# Patient Record
Sex: Male | Born: 1940 | Race: Black or African American | Hispanic: No | Marital: Single | Smoking: Current some day smoker
Health system: Southern US, Community
[De-identification: ages and names within clinical notes are randomized; demographics above are authoritative.]

## PROBLEM LIST (undated history)

## (undated) DIAGNOSIS — D649 Anemia, unspecified: Secondary | ICD-10-CM

## (undated) DIAGNOSIS — E7800 Pure hypercholesterolemia, unspecified: Secondary | ICD-10-CM

## (undated) DIAGNOSIS — E875 Hyperkalemia: Secondary | ICD-10-CM

## (undated) DIAGNOSIS — Z8679 Personal history of other diseases of the circulatory system: Secondary | ICD-10-CM

## (undated) DIAGNOSIS — I739 Peripheral vascular disease, unspecified: Secondary | ICD-10-CM

## (undated) DIAGNOSIS — J189 Pneumonia, unspecified organism: Secondary | ICD-10-CM

## (undated) DIAGNOSIS — R600 Localized edema: Secondary | ICD-10-CM

## (undated) DIAGNOSIS — I499 Cardiac arrhythmia, unspecified: Secondary | ICD-10-CM

## (undated) DIAGNOSIS — R634 Abnormal weight loss: Secondary | ICD-10-CM

## (undated) DIAGNOSIS — N183 Chronic kidney disease, stage 3 unspecified: Secondary | ICD-10-CM

## (undated) DIAGNOSIS — R569 Unspecified convulsions: Secondary | ICD-10-CM

## (undated) DIAGNOSIS — R509 Fever, unspecified: Secondary | ICD-10-CM

## (undated) DIAGNOSIS — I729 Aneurysm of unspecified site: Secondary | ICD-10-CM

## (undated) DIAGNOSIS — J449 Chronic obstructive pulmonary disease, unspecified: Secondary | ICD-10-CM

## (undated) DIAGNOSIS — I129 Hypertensive chronic kidney disease with stage 1 through stage 4 chronic kidney disease, or unspecified chronic kidney disease: Secondary | ICD-10-CM

## (undated) DIAGNOSIS — L89629 Pressure ulcer of left heel, unspecified stage: Secondary | ICD-10-CM

## (undated) DIAGNOSIS — E878 Other disorders of electrolyte and fluid balance, not elsewhere classified: Secondary | ICD-10-CM

## (undated) DIAGNOSIS — Z720 Tobacco use: Secondary | ICD-10-CM

## (undated) DIAGNOSIS — I10 Essential (primary) hypertension: Secondary | ICD-10-CM

## (undated) DIAGNOSIS — F1010 Alcohol abuse, uncomplicated: Secondary | ICD-10-CM

## (undated) HISTORY — PX: EYE SURGERY: SHX253

## (undated) HISTORY — DX: Hyperkalemia: E87.5

## (undated) HISTORY — DX: Tobacco use: Z72.0

## (undated) HISTORY — DX: Chronic obstructive pulmonary disease, unspecified: J44.9

## (undated) HISTORY — DX: Hypertensive chronic kidney disease with stage 1 through stage 4 chronic kidney disease, or unspecified chronic kidney disease: I12.9

---

## 1996-03-23 HISTORY — PX: CEREBRAL ANEURYSM REPAIR: SHX164

## 1997-09-21 ENCOUNTER — Other Ambulatory Visit: Admission: RE | Admit: 1997-09-21 | Discharge: 1997-09-21 | Payer: Self-pay | Admitting: Nephrology

## 2002-02-14 ENCOUNTER — Ambulatory Visit (HOSPITAL_COMMUNITY): Admission: RE | Admit: 2002-02-14 | Discharge: 2002-02-14 | Payer: Self-pay | Admitting: Gastroenterology

## 2004-02-27 ENCOUNTER — Emergency Department (HOSPITAL_COMMUNITY): Admission: EM | Admit: 2004-02-27 | Discharge: 2004-02-27 | Payer: Self-pay | Admitting: Emergency Medicine

## 2007-10-11 ENCOUNTER — Encounter: Admission: RE | Admit: 2007-10-11 | Discharge: 2007-10-11 | Payer: Self-pay | Admitting: Nephrology

## 2008-09-20 ENCOUNTER — Encounter: Admission: RE | Admit: 2008-09-20 | Discharge: 2008-09-20 | Payer: Self-pay | Admitting: Nephrology

## 2009-10-25 ENCOUNTER — Encounter: Admission: RE | Admit: 2009-10-25 | Discharge: 2009-10-25 | Payer: Self-pay | Admitting: Nephrology

## 2010-08-23 NOTE — Op Note (Signed)
NAME:  [name], HOOVLER                            ACCOUNT NO.:  [id number]   MEDICAL RECORD NO.:  [id number]                   PATIENT TYPE:  AMB   LOCATION:  ENDO                                 FACILITY:  [facility]   PHYSICIAN:  Jeryl Columbia, M.D.                 DATE OF BIRTH:  [birth year]   DATE OF PROCEDURE:  02/14/2002  DATE OF DISCHARGE:                                 OPERATIVE REPORT   PROCEDURE:  Colonoscopy.   INDICATIONS:  A patient with history of colon polyps. Due for repeat  screening.  Informed consent was signed.  Risks, benefits, methods, and  options were thoroughly discussed in the past by my nurse during the prep.   PREMEDICATIONS:  Demerol 100 mg, Versed 10 mg.   DESCRIPTION OF PROCEDURE:  Rectal inspection pertinent for internal  hemorrhoids, small.  Digital exam was negative.  A pediatric video  adjustable colonoscopy was inserted and easily advanced from the colon to  the cecum.  This did require rolling him on his back and some abdominal  pressure.  No abnormality was seen on insertion.  The cecum  was identified  by the appendiceal orifice and the ileocecal valve.  The scope was inserted  a short ways into the terminal ileum which was normal.  Photodocumentation  was obtained.  The scope was slowly withdrawn.  Prep was adequate.  There  was some liquid stool that required washing and suctioning.  On slow  withdrawal from the colon, other than some tiny hyperplastic appearing  rectal distal sigmoid polyps not biopsied, no abnormalities were seen.  Once  back in the rectum, the scope was retroflexed, pertinent for some internal  hemorrhoids.  The scope was straightened and readvanced toward the right and  left side of the colon.  Air was suctioned and the scope removed.  The  patient tolerated the procedure well.  There were no obvious immediate  complications.   ENDOSCOPIC DIAGNOSES:  1. Internal and external hemorrhoids.  2. A few tiny hyperplastic  perirectal and distal sigmoid polyps, not     biopsied.  3. Otherwise within normal limits to the terminal ileum.   PLAN:  Will be happy to see back p.r.n.  Otherwise return to the care of Dr.  Mare Ferrari for the customary health care maintenance, yearly rectals and  guaiacs, and otherwise doing well medically.  Repeat screening in five  years.                                                 Jeryl Columbia, M.D.    MEM/MEDQ  D:  02/14/2002  T:  02/14/2002  Job:  [id number]   cc:   Melburn Hake, M.D.  [address] [place]  [place]  [place] [address]  Fax: [phone]

## 2011-04-06 ENCOUNTER — Emergency Department (INDEPENDENT_AMBULATORY_CARE_PROVIDER_SITE_OTHER)
Admission: EM | Admit: 2011-04-06 | Discharge: 2011-04-06 | Disposition: A | Discharge status: Home / Self Care | Payer: Medicare Other | Source: Home / Self Care | Attending: Emergency Medicine | Admitting: Emergency Medicine

## 2011-04-06 ENCOUNTER — Emergency Department (INDEPENDENT_AMBULATORY_CARE_PROVIDER_SITE_OTHER): Payer: Medicare Other

## 2011-04-06 DIAGNOSIS — J40 Bronchitis, not specified as acute or chronic: Secondary | ICD-10-CM

## 2011-04-06 HISTORY — DX: Pure hypercholesterolemia, unspecified: E78.00

## 2011-04-06 HISTORY — DX: Essential (primary) hypertension: I10

## 2011-04-06 HISTORY — DX: Other disorders of electrolyte and fluid balance, not elsewhere classified: E87.8

## 2011-04-06 HISTORY — DX: Aneurysm of unspecified site: I72.9

## 2011-04-06 MED ORDER — ALBUTEROL SULFATE (5 MG/ML) 0.5% IN NEBU
INHALATION_SOLUTION | RESPIRATORY_TRACT | Status: AC
  Filled 2011-04-06: qty 1

## 2011-04-06 MED ORDER — PREDNISONE 20 MG PO TABS
60.0000 mg | ORAL_TABLET | Freq: Once | ORAL | Status: AC
  Administered 2011-04-06: 60 mg via ORAL

## 2011-04-06 MED ORDER — DEXAMETHASONE 4 MG PO TABS: ORAL_TABLET | ORAL | Status: AC

## 2011-04-06 MED ORDER — PREDNISONE 20 MG PO TABS
ORAL_TABLET | ORAL | Status: AC
  Filled 2011-04-06: qty 3

## 2011-04-06 MED ORDER — IPRATROPIUM BROMIDE 0.02 % IN SOLN
0.5000 mg | Freq: Once | RESPIRATORY_TRACT | Status: AC
  Administered 2011-04-06: 0.5 mg via RESPIRATORY_TRACT

## 2011-04-06 MED ORDER — ALBUTEROL SULFATE (5 MG/ML) 0.5% IN NEBU
5.0000 mg | INHALATION_SOLUTION | Freq: Once | RESPIRATORY_TRACT | Status: AC
  Administered 2011-04-06: 5 mg via RESPIRATORY_TRACT

## 2011-04-06 MED ORDER — ALBUTEROL SULFATE HFA 108 (90 BASE) MCG/ACT IN AERS
1.0000 | INHALATION_SPRAY | Freq: Four times a day (QID) | RESPIRATORY_TRACT | Status: DC | PRN
Start: 2011-04-06 — End: 2012-04-25

## 2011-04-06 NOTE — ED Notes (Signed)
Pt has cough that started one week ago.

## 2011-04-06 NOTE — ED Provider Notes (Signed)
History     CSN: [id number]  Arrival date & time 04/06/11  1055   First MD Initiated Contact with Patient 04/06/11 1235      Chief Complaint  Patient presents with  . Cough    HPI Comments: Pt with nonproductive cough x 1 month. Mild SOB.  no wheezing, CP, N/V, posttussive emesis, hemoptysis, fatigue, myalgias, untentional weight loss, fevers. Pt is longterm heavy smoker and states this cough is from smoking, and that it has gotten better since he cut down to 1/2 ppd. Also taking alkaseltzer cough and cold with relief. Last dose several days ago. Pt wants to make sure he does not have PNA. No prev dex of emphysema/COPD/asthma.   Patient is a 70 y.o. male presenting with cough. The history is provided by the patient.  Cough This is a new problem. The current episode started more than 1 week ago. The problem occurs constantly. The problem has been gradually improving. The cough is non-productive. There has been no fever. Associated symptoms include shortness of breath. Pertinent negatives include no chest pain, no chills, no sweats, no weight loss, no ear pain, no headaches, no rhinorrhea, no myalgias and no wheezing. He is a smoker.    Past Medical History  Diagnosis Date  . Hypertension   . Hyperchloremia   . Hypercholesteremia   . Aneurysm     Past Surgical History  Procedure Date  . Cerebral aneurysm repair     History reviewed. No pertinent family history.  History  Substance Use Topics  . Smoking status: Current Everyday Smoker -- 0.5 packs/day for 56 years  . Smokeless tobacco: Not on file  . Alcohol Use: Yes      Review of Systems  Constitutional: Negative for fever, chills, weight loss, fatigue and unexpected weight change.  HENT: Negative for ear pain and rhinorrhea.   Respiratory: Positive for cough and shortness of breath. Negative for chest tightness, wheezing and stridor.   Cardiovascular: Negative for chest pain.  Gastrointestinal: Negative for nausea,  vomiting and abdominal pain.  Musculoskeletal: Negative for myalgias.  Skin: Negative for pallor and rash.  Neurological: Negative for weakness and headaches.    Allergies  Review of patient's allergies indicates no known allergies.  Home Medications   Current Outpatient Rx  Name Route Sig Dispense Refill  . DIVALPROEX SODIUM 125 MG PO TBEC Oral Take 125 mg by mouth 3 (three) times daily.      Marland Kitchen PRESCRIPTION MEDICATION  Pt takes b/p, chol and ? Med for aneurysm     . SIMVASTATIN 10 MG PO TABS Oral Take 10 mg by mouth at bedtime.      . ALBUTEROL SULFATE HFA 108 (90 BASE) MCG/ACT IN AERS Inhalation Inhale 1-2 puffs into the lungs every 6 (six) hours as needed for wheezing. 1 Inhaler 0  . DEXAMETHASONE 4 MG PO TABS  4 tabs po at once on day one, 4 tabs po at once on day 2 8 tablet 0    BP 175/83  Pulse 63  Temp(Src) 98.8 F (37.1 C) (Oral)  Resp 15  SpO2 93%  Physical Exam  Nursing note and vitals reviewed. Constitutional: He is oriented to person, place, and time. He appears well-developed and well-nourished. No distress.  HENT:  Head: Normocephalic and atraumatic.  Eyes: Conjunctivae and EOM are normal. Pupils are equal, round, and reactive to light.  Neck: Normal range of motion. Neck supple.  Cardiovascular: Regular rhythm.   Pulmonary/Chest: Effort normal. No respiratory distress. He  has no wheezes. He has no rales. He exhibits no tenderness.       Poor air movement  Abdominal: Soft. Bowel sounds are normal. He exhibits no distension.  Musculoskeletal: Normal range of motion.  Lymphadenopathy:    He has no cervical adenopathy.  Neurological: He is alert and oriented to person, place, and time.  Skin: Skin is warm and dry.  Psychiatric: He has a normal mood and affect. His behavior is normal.    ED Course  Procedures (including critical care time)  Labs Reviewed - No data to display Dg Chest 2 View  04/06/2011  *RADIOLOGY REPORT*  Clinical Data: Persistent cough.   CHEST - 2 VIEW  Comparison: 10/25/2009  Findings: There is chronic peribronchial thickening, best seen on the lateral view.  No acute infiltrates or effusions.  Heart size and vascularity are normal.  No osseous abnormality.  IMPRESSION: Chronic bronchitic changes, unchanged since 10/25/2009.  Original Report Authenticated By: Larey Seat, M.D.     1. Bronchitis       MDM  Suspect emphysema/copd with 50+ year h/o smoking. States sx getting better since he cut down to 1/2 ppd. satting 93% on RA bvut is speaking in full sentences. Suspect that this is baseline but have no records indicating this. No respiratory distress. Poor air movement. Will check CXR, give duoneb, prednisone & re-evaluate.   Previous chart, labs, imaging reviewed. CXR on 7.2011 with borderline cardiomegaly. Imaging reviewed by myself. Report per radiologist.  Dg Chest 2 View  04/06/2011  *RADIOLOGY REPORT*  Clinical Data: Persistent cough.  CHEST - 2 VIEW  Comparison: 10/25/2009  Findings: There is chronic peribronchial thickening, best seen on the lateral view.  No acute infiltrates or effusions.  Heart size and vascularity are normal.  No osseous abnormality.  IMPRESSION: Chronic bronchitic changes, unchanged since 10/25/2009.  Original Report Authenticated By: Larey Seat, M.D.    On re-evaluation, pt feels much improved after medication, pt comfortable, VSS. satting 99% on RA. Has some ronchi RLL but Improved air movement, no wheezing on repeat physical exam.   Discussed imaging with patient.. Emphasized importance of f/u. Pt agrees.      Cherly Beach, MD 04/07/11 2130

## 2012-04-25 ENCOUNTER — Emergency Department (HOSPITAL_COMMUNITY)
Admission: EM | Admit: 2012-04-25 | Discharge: 2012-04-25 | Disposition: A | Discharge status: Home / Self Care | Payer: Medicare Other | Attending: Emergency Medicine | Admitting: Emergency Medicine

## 2012-04-25 ENCOUNTER — Emergency Department (HOSPITAL_COMMUNITY): Payer: Medicare Other

## 2012-04-25 ENCOUNTER — Encounter (HOSPITAL_COMMUNITY): Payer: Self-pay | Admitting: Emergency Medicine

## 2012-04-25 DIAGNOSIS — I10 Essential (primary) hypertension: Secondary | ICD-10-CM | POA: Insufficient documentation

## 2012-04-25 DIAGNOSIS — Z8639 Personal history of other endocrine, nutritional and metabolic disease: Secondary | ICD-10-CM | POA: Insufficient documentation

## 2012-04-25 DIAGNOSIS — E7800 Pure hypercholesterolemia, unspecified: Secondary | ICD-10-CM | POA: Insufficient documentation

## 2012-04-25 DIAGNOSIS — I729 Aneurysm of unspecified site: Secondary | ICD-10-CM | POA: Insufficient documentation

## 2012-04-25 DIAGNOSIS — G40909 Epilepsy, unspecified, not intractable, without status epilepticus: Secondary | ICD-10-CM | POA: Insufficient documentation

## 2012-04-25 DIAGNOSIS — Y929 Unspecified place or not applicable: Secondary | ICD-10-CM | POA: Insufficient documentation

## 2012-04-25 DIAGNOSIS — X58XXXA Exposure to other specified factors, initial encounter: Secondary | ICD-10-CM | POA: Insufficient documentation

## 2012-04-25 DIAGNOSIS — R4182 Altered mental status, unspecified: Secondary | ICD-10-CM | POA: Insufficient documentation

## 2012-04-25 DIAGNOSIS — Z79899 Other long term (current) drug therapy: Secondary | ICD-10-CM | POA: Insufficient documentation

## 2012-04-25 DIAGNOSIS — F17200 Nicotine dependence, unspecified, uncomplicated: Secondary | ICD-10-CM | POA: Insufficient documentation

## 2012-04-25 DIAGNOSIS — Z862 Personal history of diseases of the blood and blood-forming organs and certain disorders involving the immune mechanism: Secondary | ICD-10-CM | POA: Insufficient documentation

## 2012-04-25 DIAGNOSIS — S01409A Unspecified open wound of unspecified cheek and temporomandibular area, initial encounter: Secondary | ICD-10-CM | POA: Insufficient documentation

## 2012-04-25 DIAGNOSIS — R569 Unspecified convulsions: Secondary | ICD-10-CM

## 2012-04-25 DIAGNOSIS — Y939 Activity, unspecified: Secondary | ICD-10-CM | POA: Insufficient documentation

## 2012-04-25 LAB — COMPREHENSIVE METABOLIC PANEL
ALT: 19 U/L (ref 0–53)
AST: 25 U/L (ref 0–37)
Albumin: 3.9 g/dL (ref 3.5–5.2)
Alkaline Phosphatase: 74 U/L (ref 39–117)
BUN: 18 mg/dL (ref 6–23)
CO2: 26 mEq/L (ref 19–32)
Calcium: 9.4 mg/dL (ref 8.4–10.5)
Chloride: 100 mEq/L (ref 96–112)
Creatinine, Ser: 1.22 mg/dL (ref 0.50–1.35)
GFR calc Af Amer: 66 mL/min — ABNORMAL LOW (ref >90)
GFR calc non Af Amer: 57 mL/min — ABNORMAL LOW (ref >90)
Glucose, Bld: 132 mg/dL — ABNORMAL HIGH (ref 70–99)
Potassium: 4.4 mEq/L (ref 3.5–5.1)
Sodium: 136 mEq/L (ref 135–145)
Total Bilirubin: 0.3 mg/dL (ref 0.3–1.2)
Total Protein: 7.7 g/dL (ref 6.0–8.3)

## 2012-04-25 LAB — CBC WITH DIFFERENTIAL/PLATELET
Basophils Absolute: 0.1 10*3/uL (ref 0.0–0.1)
Basophils Relative: 1 % (ref 0–1)
Eosinophils Absolute: 0.1 10*3/uL (ref 0.0–0.7)
Eosinophils Relative: 1 % (ref 0–5)
HCT: 40.4 % (ref 39.0–52.0)
Hemoglobin: 14.2 g/dL (ref 13.0–17.0)
Lymphocytes Relative: 41 % (ref 12–46)
Lymphs Abs: 2.3 10*3/uL (ref 0.7–4.0)
MCHC: 35.1 g/dL (ref 30.0–36.0)
MCV: 92 fL (ref 78.0–100.0)
Monocytes Relative: 11 % (ref 3–12)
Neutro Abs: 2.4 10*3/uL (ref 1.7–7.7)
Neutrophils Relative %: 46 % (ref 43–77)
Platelets: 172 10*3/uL (ref 150–400)
RBC: 4.39 MIL/uL (ref 4.22–5.81)
RDW: 13.1 % (ref 11.5–15.5)
WBC: 5.5 10*3/uL (ref 4.0–10.5)

## 2012-04-25 LAB — PHENYTOIN LEVEL, TOTAL: Phenytoin Lvl: <2.5 ug/mL — ABNORMAL LOW (ref 10.0–20.0)

## 2012-04-25 LAB — VALPROIC ACID LEVEL: Valproic Acid Lvl: <10 ug/mL — ABNORMAL LOW (ref 50.0–100.0)

## 2012-04-25 MED ORDER — PHENYTOIN SODIUM EXTENDED 100 MG PO CAPS: 100.0000 mg | ORAL_CAPSULE | Freq: Three times a day (TID) | ORAL | Status: DC

## 2012-04-25 MED ORDER — PHENYTOIN SODIUM EXTENDED 100 MG PO CAPS
300.0000 mg | ORAL_CAPSULE | Freq: Once | ORAL | Status: AC
  Administered 2012-04-25: 300 mg via ORAL
  Filled 2012-04-25: qty 3

## 2012-04-25 MED ORDER — LORAZEPAM 2 MG/ML IJ SOLN
INTRAMUSCULAR | Status: AC
  Administered 2012-04-25: 2 mg
  Filled 2012-04-25: qty 1

## 2012-04-25 MED ORDER — VALPROATE SODIUM 500 MG/5ML IV SOLN
500.0000 mg | Freq: Once | INTRAVENOUS | Status: AC
  Administered 2012-04-25: 500 mg via INTRAVENOUS
  Filled 2012-04-25: qty 5

## 2012-04-25 MED ORDER — DIVALPROEX SODIUM 250 MG PO DR TAB: 250.0000 mg | DELAYED_RELEASE_TABLET | Freq: Three times a day (TID) | ORAL | Status: DC

## 2012-04-25 NOTE — ED Notes (Signed)
Dr Zenia Resides at bedside assessing pt

## 2012-04-25 NOTE — ED Notes (Signed)
Dr Zenia Resides at bedside removed c-collar

## 2012-04-25 NOTE — ED Notes (Signed)
Pts brother, Adedamola Gasparyan, [phone]

## 2012-04-25 NOTE — ED Notes (Signed)
Per family member at bedside pt stated need to go to bathroom than began shaking and convulsing, states pt brought arms tight to chest; Dr Zenia Resides notified about pt situation and ordered IV Ativan; pt given 2mg  Ativan IV; pt is currently in post ictal period, pt currently obtunded and somnolence. Pt does not display signs of incontinence or injury to mouth; saliva noted around mouth; pt placed on nonrebreather; pt heart rate elevated during seizure activity. Padded side rails in place, suction at bedside. Per witness and MD seizure last less than a minute.

## 2012-04-25 NOTE — ED Notes (Addendum)
Assessed pt post seizure; pt remains lethargic after seizure but awoke to speech; pt states he remembers the seizure; pt oriented to person and place.

## 2012-04-25 NOTE — ED Provider Notes (Signed)
History     CSN: [id number]  Arrival date & time 04/25/12  9   First MD Initiated Contact with Patient 04/25/12 1227      Chief Complaint  Patient presents with  . Fall  . Altered Mental Status    (Consider location/radiation/quality/duration/timing/severity/associated sxs/prior treatment) Patient is a 72 y.o. male presenting with fall and altered mental status. The history is provided by the EMS personnel and the patient.  Fall  Altered Mental Status   patient here after having possible seizure. Course the family, patient fell 4 and had whole-body tremors with a postictal period according to EMS. Patient was on Dilantin Depakote due 2 a prior history of head injury. CBG was 101 according to EMS. Patient complains of pain to his face. Denies any neck pain or severe headache at this time. Denies any abdominal or chest pain. No recent history of illness according to him. He was transported here  Past Medical History  Diagnosis Date  . Hypertension   . Hyperchloremia   . Hypercholesteremia   . Aneurysm     Past Surgical History  Procedure Date  . Cerebral aneurysm repair     History reviewed. No pertinent family history.  History  Substance Use Topics  . Smoking status: Current Every Day Smoker -- 0.5 packs/day for 56 years  . Smokeless tobacco: Not on file  . Alcohol Use: Yes      Review of Systems  Psychiatric/Behavioral: Positive for altered mental status.  All other systems reviewed and are negative.    Allergies  Review of patient's allergies indicates no known allergies.  Home Medications   Current Outpatient Rx  Name  Route  Sig  Dispense  Refill  . ALBUTEROL SULFATE HFA 108 (90 BASE) MCG/ACT IN AERS   Inhalation   Inhale 1-2 puffs into the lungs every 6 (six) hours as needed for wheezing.   1 Inhaler   0   . DIVALPROEX SODIUM 125 MG PO TBEC   Oral   Take 125 mg by mouth 3 (three) times daily.           Marland Kitchen PRESCRIPTION MEDICATION      Pt  takes b/p, chol and ? Med for aneurysm          . SIMVASTATIN 10 MG PO TABS   Oral   Take 10 mg by mouth at bedtime.             There were no vitals taken for this visit.  Physical Exam  Nursing note and vitals reviewed. Constitutional: He is oriented to person, place, and time. He appears well-developed and well-nourished.  Non-toxic appearance. No distress.  HENT:  Head: Normocephalic and atraumatic.         Left-sided facial abrasions noted.  Eyes: Conjunctivae normal, EOM and lids are normal. Pupils are equal, round, and reactive to light.  Neck: Normal range of motion. Neck supple. No tracheal deviation present. No mass present.  Cardiovascular: Normal rate, regular rhythm and normal heart sounds.  Exam reveals no gallop.   No murmur heard. Pulmonary/Chest: Effort normal and breath sounds normal. No stridor. No respiratory distress. He has no decreased breath sounds. He has no wheezes. He has no rhonchi. He has no rales.  Abdominal: Soft. Normal appearance and bowel sounds are normal. He exhibits no distension. There is no tenderness. There is no rebound and no CVA tenderness.  Musculoskeletal: Normal range of motion. He exhibits no edema and no tenderness.  Neurological: He is  alert and oriented to person, place, and time. He has normal strength. No cranial nerve deficit or sensory deficit. GCS eye subscore is 4. GCS verbal subscore is 5. GCS motor subscore is 6.  Skin: Skin is warm and dry. No abrasion and no rash noted.  Psychiatric: He has a normal mood and affect. His speech is normal and behavior is normal.    ED Course  LACERATION REPAIR Date/Time: 04/25/2012 5:01 PM Performed by: Leota Jacobsen Authorized by: Lacretia Leigh T Consent: Verbal consent obtained. Risks and benefits: risks, benefits and alternatives were discussed Consent given by: patient Patient identity confirmed: verbally with patient Time out: Immediately prior to procedure a "time out" was  called to verify the correct patient, procedure, equipment, support staff and site/side marked as required. Body area: head/neck Location details: left cheek Laceration length: 2 cm Nerve involvement: none Vascular damage: no Patient sedated: no Debridement: none Skin closure: glue Approximation: close Approximation difficulty: simple Patient tolerance: Patient tolerated the procedure well with no immediate complications.   (including critical care time)   Labs Reviewed  CBC WITH DIFFERENTIAL  COMPREHENSIVE METABOLIC PANEL   No results found.   No diagnosis found.    MDM  Patient had 1 minute tonic-clonic seizure here. Was given Ativan 2 mg IV push. Patient had his Dilantin discontinued and has been on a Depakote taper. Patient was given Depakote 500 mg IV piggyback here. He has been reassessed multiple times. His mental status has been improving he is now back to his baseline. We'll continue to monitor and likely discharged home  CRITICAL CARE Performed by: Leota Jacobsen   Total critical care time: 75  Critical care time was exclusive of separately billable procedures and treating other patients.  Critical care was necessary to treat or prevent imminent or life-threatening deterioration.  Critical care was time spent personally by me on the following activities: development of treatment plan with patient and/or surrogate as well as nursing, discussions with consultants, evaluation of patient's response to treatment, examination of patient, obtaining history from patient or surrogate, ordering and performing treatments and interventions, ordering and review of laboratory studies, ordering and review of radiographic studies, pulse oximetry and re-evaluation of patient's condition.  6:25 PM Patient to be restarted back on his Depakote and Dilantin. Given prescriptions for same. And we'll see his Dr. next week     Leota Jacobsen, MD 04/25/12 [phone]

## 2012-04-25 NOTE — ED Notes (Signed)
Pt ticket to ride given to transporter, JE. Pt being taken to CT

## 2012-04-25 NOTE — ED Notes (Signed)
Bacitracin ointment applied to facial lacerations per Dr. Zenia Resides order

## 2012-04-25 NOTE — ED Notes (Signed)
Discussed d/c teaching with pt and with pts brother. Pt and pts brother verbalized understanding of d/c teaching and prescriptions. Pt and family have no further questions upon d/c. Pt does not appear to be in acute distress upon d/c. Pt taken to car via wheelchair and helped into vehicle. Pt leaving with d/c paperwork and prescriptions.

## 2012-04-25 NOTE — ED Notes (Signed)
Dr Zenia Resides at bedside with Dermabond

## 2012-04-25 NOTE — ED Notes (Signed)
Pt returned from CT and began seizing and got verbal order for Ativan

## 2012-04-25 NOTE — ED Notes (Signed)
Pt is alert and oriented x 4. Pt mentating appropriately. Pts vitals stable

## 2012-04-25 NOTE — ED Notes (Signed)
Per EMS: family states pt became shaky and fell on front of face; pt was originally confused upon arrival and appeared to be post ictal; pt was on depakote and dilantin and was taken off both medications; pt had seizure like activity; pt denies pain; CBG 101; 18G in R forearm; BP168/86 HR 81 RR 24; no ETOH

## 2012-04-28 ENCOUNTER — Other Ambulatory Visit: Payer: Self-pay

## 2012-04-28 ENCOUNTER — Emergency Department (HOSPITAL_COMMUNITY): Payer: Medicare Other

## 2012-04-28 ENCOUNTER — Encounter (HOSPITAL_COMMUNITY): Payer: Self-pay | Admitting: *Deleted

## 2012-04-28 ENCOUNTER — Emergency Department (HOSPITAL_COMMUNITY)
Admission: EM | Admit: 2012-04-28 | Discharge: 2012-04-28 | Disposition: A | Discharge status: Home / Self Care | Payer: Medicare Other | Attending: Emergency Medicine | Admitting: Emergency Medicine

## 2012-04-28 DIAGNOSIS — Z8669 Personal history of other diseases of the nervous system and sense organs: Secondary | ICD-10-CM | POA: Insufficient documentation

## 2012-04-28 DIAGNOSIS — R42 Dizziness and giddiness: Secondary | ICD-10-CM | POA: Insufficient documentation

## 2012-04-28 DIAGNOSIS — Z8639 Personal history of other endocrine, nutritional and metabolic disease: Secondary | ICD-10-CM | POA: Insufficient documentation

## 2012-04-28 DIAGNOSIS — E7800 Pure hypercholesterolemia, unspecified: Secondary | ICD-10-CM | POA: Insufficient documentation

## 2012-04-28 DIAGNOSIS — F17200 Nicotine dependence, unspecified, uncomplicated: Secondary | ICD-10-CM | POA: Insufficient documentation

## 2012-04-28 DIAGNOSIS — Z862 Personal history of diseases of the blood and blood-forming organs and certain disorders involving the immune mechanism: Secondary | ICD-10-CM | POA: Insufficient documentation

## 2012-04-28 DIAGNOSIS — R21 Rash and other nonspecific skin eruption: Secondary | ICD-10-CM | POA: Insufficient documentation

## 2012-04-28 DIAGNOSIS — I10 Essential (primary) hypertension: Secondary | ICD-10-CM | POA: Insufficient documentation

## 2012-04-28 DIAGNOSIS — Z79899 Other long term (current) drug therapy: Secondary | ICD-10-CM | POA: Insufficient documentation

## 2012-04-28 DIAGNOSIS — T50905A Adverse effect of unspecified drugs, medicaments and biological substances, initial encounter: Secondary | ICD-10-CM

## 2012-04-28 DIAGNOSIS — G40909 Epilepsy, unspecified, not intractable, without status epilepticus: Secondary | ICD-10-CM | POA: Insufficient documentation

## 2012-04-28 DIAGNOSIS — T4995XA Adverse effect of unspecified topical agent, initial encounter: Secondary | ICD-10-CM | POA: Insufficient documentation

## 2012-04-28 DIAGNOSIS — E875 Hyperkalemia: Secondary | ICD-10-CM | POA: Insufficient documentation

## 2012-04-28 DIAGNOSIS — N289 Disorder of kidney and ureter, unspecified: Secondary | ICD-10-CM

## 2012-04-28 HISTORY — DX: Unspecified convulsions: R56.9

## 2012-04-28 LAB — CBC
MCV: 93.2 fL (ref 78.0–100.0)
Platelets: 213 10*3/uL (ref 150–400)
RDW: 13.2 % (ref 11.5–15.5)
WBC: 5 10*3/uL (ref 4.0–10.5)

## 2012-04-28 LAB — COMPREHENSIVE METABOLIC PANEL
ALT: 13 U/L (ref 0–53)
AST: 18 U/L (ref 0–37)
Albumin: 3.5 g/dL (ref 3.5–5.2)
Alkaline Phosphatase: 68 U/L (ref 39–117)
BUN: 19 mg/dL (ref 6–23)
CO2: 23 mEq/L (ref 19–32)
Calcium: 9.5 mg/dL (ref 8.4–10.5)
Chloride: 104 mEq/L (ref 96–112)
Creatinine, Ser: 1.43 mg/dL — ABNORMAL HIGH (ref 0.50–1.35)
GFR calc Af Amer: 55 mL/min — ABNORMAL LOW (ref >90)
GFR calc non Af Amer: 47 mL/min — ABNORMAL LOW (ref >90)
Glucose, Bld: 142 mg/dL — ABNORMAL HIGH (ref 70–99)
Potassium: 5.2 mEq/L — ABNORMAL HIGH (ref 3.5–5.1)
Sodium: 140 mEq/L (ref 135–145)
Total Bilirubin: 0.2 mg/dL — ABNORMAL LOW (ref 0.3–1.2)
Total Protein: 7.5 g/dL (ref 6.0–8.3)

## 2012-04-28 LAB — POCT I-STAT TROPONIN I

## 2012-04-28 MED ORDER — SODIUM CHLORIDE 0.9 % IV BOLUS (SEPSIS)
500.0000 mL | Freq: Once | INTRAVENOUS | Status: AC
  Administered 2012-04-28: 1000 mL via INTRAVENOUS

## 2012-04-28 MED ORDER — LEVETIRACETAM 500 MG PO TABS: 500.0000 mg | ORAL_TABLET | Freq: Two times a day (BID) | ORAL | Status: DC

## 2012-04-28 MED ORDER — SODIUM CHLORIDE 0.9 % IV SOLN
1000.0000 mg | Freq: Once | INTRAVENOUS | Status: AC
  Administered 2012-04-28: 1000 mg via INTRAVENOUS
  Filled 2012-04-28: qty 10

## 2012-04-28 NOTE — Discharge Instructions (Signed)
Hyperkalemia  Hyperkalemia is when you have too much potassium in your blood. This can be a life-threatening condition. Potassium is normally removed (excreted) from the body by the kidneys.  CAUSES   The potassium level in your body can become too high for the following reasons:   You take in too much potassium. You can do this by:   Using salt substitutes. They contain large amounts of potassium.   Taking potassium supplements from your caregiver. The dose may be too high for you.   Eating foods or taking nutritional products with potassium.   You excrete too little potassium. This can happen if:   Your kidneys are not functioning properly. Kidney (renal) disease is a very common cause of hyperkalemia.   You are taking medicines that lower your excretion of potassium, such as certain diuretic medicines.   You have an adrenal gland disease called Addison's disease.   You have a urinary tract obstruction, such as kidney stones.   You are on treatment to mechanically clean your blood (dialysis) and you skip a treatment.   You release a high amount of potassium from your cells into your blood. You may have a condition that causes potassium to move from your cells to your bloodstream. This can happen with:   Injury to muscles or other tissues. Most potassium is stored in the muscles.   Severe burns or infections.   Acidic blood plasma (acidosis). Acidosis can result from many diseases, such as uncontrolled diabetes.  SYMPTOMS   Usually, there are no symptoms unless the potassium is dangerously high or has risen very quickly. Symptoms may include:   Irregular or very slow heartbeat.   Feeling sick to your stomach (nauseous).   Tiredness (fatigue).   Nerve problems such as tingling of the skin, numbness of the hands or feet, weakness, or paralysis.  DIAGNOSIS   A simple blood test can measure the amount of potassium in your body. An electrocardiogram test of the heart can also help make the diagnosis.  The heart may beat dangerously fast or slow down and stop beating with severe hyperkalemia.   TREATMENT   Treatment depends on how bad the condition is and on the underlying cause.   If the hyperkalemia is an emergency (causing heart problems or paralysis), many different medicines can be used alone or together to lower the potassium level briefly. This may include an insulin injection even if you are not diabetic. Emergency dialysis may be needed to remove potassium from the body.   If the hyperkalemia is less severe or dangerous, the underlying cause is treated. This can include taking medicines if needed. Your prescription medicines may be changed. You may also need to take a medicine to help your body get rid of potassium. You may need to eat a diet low in potassium.  HOME CARE INSTRUCTIONS    Take medicines and supplements as directed by your caregiver.   Do not take any over-the-counter medicines, supplements, natural products, herbs, or vitamins without reviewing them with your caregiver. Certain supplements and natural food products can have high amounts of potassium. Other products (such as ibuprofen) can damage weak kidneys and raise your potassium.   You may be asked to do repeat lab tests. Be sure to follow these directions.   If you have kidney disease, you may need to follow a low potassium diet.  SEEK MEDICAL CARE IF:    You notice an irregular or very slow heartbeat.   You feel lightheaded.     these instructions.  Will watch your condition.  Will get help right away if you are not doing well or get worse. Document Released: 03/14/2002 Document Revised: 06/16/2011 Document Reviewed: 08/29/2010 [facility] Patient Information 2013 [place]. Drug Allergy Allergic reactions to medicines  are common. Some allergic reactions are mild. A delayed type of drug allergy that occurs 1 week or more after exposure to a medicine or vaccine is called serum sickness. A life-threatening, sudden (acute) allergic reaction that involves the whole body is called anaphylaxis. CAUSES  "True" drug allergies occur when there is an allergic reaction to a medicine. This is caused by overactivity of the immune system. First, the body becomes sensitized. The immune system is triggered by your first exposure to the medicine. Following this first exposure, future exposure to the same medicine may be life-threatening. Almost any medicine can cause an allergic reaction. Common ones are:  Penicillin.  Sulfonamides (sulfa drugs).  Local anesthetics.  X-ray dyes that contain iodine. SYMPTOMS  Common symptoms of a minor allergic reaction are:  Swelling around the mouth.  An itchy red rash or hives.  Vomiting or diarrhea. Anaphylaxis can cause swelling of the mouth and throat. This makes it difficult to breathe and swallow. Severe reactions can be fatal within seconds, even after exposure to only a trace amount of the drug that causes the reaction. HOME CARE INSTRUCTIONS   If you are unsure of what caused your reaction, keep a diary of foods and medicines used. Include the symptoms that followed. Avoid anything that causes reactions.  You may want to follow up with an allergy specialist after the reaction has cleared in order to be tested to confirm the allergy. It is important to confirm that your reaction is an allergy, not just a side effect to the medicine. If you have a true allergy to a medicine, this may prevent that medicine and related medicines from being given to you when you are very ill.  If you have hives or a rash:  Take medicines as directed by your caregiver.  You may use an over-the-counter antihistamine (diphenhydramine) as needed.  Apply cold compresses to the skin or take baths in  cool water. Avoid hot baths or showers.  If you are severely allergic:  Continuous observation after a severe reaction may be needed. Hospitalization is often required.  Wear a medical alert bracelet or necklace stating your allergy.  You and your family must learn how to use an anaphylaxis kit or give an epinephrine injection to temporarily treat an emergency allergic reaction. If you have had a severe reaction, always carry your epinephrine injection or anaphylaxis kit with you. This can be lifesaving if you have a severe reaction.  Do not drive or perform tasks after treatment until the medicines used to treat your reaction have worn off, or until your caregiver says it is okay. SEEK MEDICAL CARE IF:   You think you had an allergic reaction. Symptoms usually start within 30 minutes after exposure.  Symptoms are getting worse rather than better.  You develop new symptoms.  The symptoms that brought you to your caregiver return. SEEK IMMEDIATE MEDICAL CARE IF:   You have swelling of the mouth, difficulty breathing, or wheezing.  You have a tight feeling in your chest or throat.  You develop hives, swelling, or itching all over your body.  You develop severe vomiting or diarrhea.  You feel faint or pass out. This is an emergency. Use your epinephrine injection or anaphylaxis kit  as you have been instructed. Call for emergency medical help. Even if you improve after the injection, you need to be examined at a hospital emergency department. MAKE SURE YOU:   Understand these instructions.  Will watch your condition.  Will get help right away if you are not doing well or get worse. Document Released: 03/24/2005 Document Revised: 06/16/2011 Document Reviewed: 08/28/2010 [facility] Patient Information 2013 [place].

## 2012-04-28 NOTE — ED Notes (Addendum)
States fell Sunday, hit head, bruising noted over left eye, rash noted over arms, trunk, legs.  Per pt- does not remember fall- but does remember being in ambulance. 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 Had a seizure on Sunday= the reason he fell- seen here in ED. Supposed to have follow up appt at 1pm today, but was dizzy-- called dr office, told to come here.  Rash started after receiving IV medication in ED- has not started any other meds yet.

## 2012-04-28 NOTE — ED Notes (Signed)
C/o fingers get numb, "funny feeling" turning white color, fingers normal color now,

## 2012-04-28 NOTE — ED Notes (Signed)
Pt is a poor historian, reports having itching rash to entire body. 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 Reports intermittent episodes of dizziness, but denies being dizzy at this time. No acute distress ntoed.

## 2012-04-28 NOTE — ED Provider Notes (Signed)
History     CSN: [id number]  Arrival date & time 04/28/12  1139   First MD Initiated Contact with Patient 04/28/12 1328      Chief Complaint  Patient presents with  . Fall  . Dizziness    (Consider location/radiation/quality/duration/timing/severity/associated sxs/prior treatment) Patient is a 72 y.o. male presenting with fall. The history is provided by the patient.  Fall Pertinent negatives include no numbness, no abdominal pain, no nausea, no vomiting and no headaches.   patient presents with a rash. It covers nobody began on Sunday after visit to the ER. He had been tapered off his Depakote and had been stopped off his Dilantin for a while. He had a seizure. He was loaded on Depakote and Dilantin. He has had a rash that began and has been worsening since then. He states is over his whole body. It is pruritic. He has not had rashes like this before. No fevers. No cough. He states he did have an episode of dizziness, he was in a fall over. No chest pain.  Past Medical History  Diagnosis Date  . Hypertension   . Hyperchloremia   . Hypercholesteremia   . Aneurysm   . Seizures     has not had a seizure in 15 yrs    Past Surgical History  Procedure Date  . 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 No pertinent family history.  History  Substance Use Topics  . Smoking status: Current Every Day Smoker -- 0.5 packs/day for 56 years  . Smokeless tobacco: Not on file  . Alcohol Use: Yes     Comment: 6 pack/day      Review of Systems  Constitutional: Negative for activity change and appetite change.  HENT: Negative for neck stiffness.   Eyes: Negative for pain.  Respiratory: Negative for chest tightness and shortness of breath.   Cardiovascular: Negative for chest pain and leg swelling.  Gastrointestinal: Negative for nausea, vomiting, abdominal pain and diarrhea.  Genitourinary: Negative for flank pain.  Musculoskeletal: Negative for back pain.    Skin: Positive for rash.  Neurological: Positive for dizziness. Negative for weakness, numbness and headaches.  Psychiatric/Behavioral: Negative for behavioral problems.    Allergies  Review of patient's allergies indicates no known allergies.  Home Medications   Current Outpatient Rx  Name  Route  Sig  Dispense  Refill  . ATORVASTATIN CALCIUM 20 MG PO TABS   Oral   Take 20 mg by mouth daily.         Marland Kitchen DIVALPROEX SODIUM 250 MG PO TBEC   Oral   Take 1 tablet (250 mg total) by mouth 3 (three) times daily.   90 tablet   0   . PHENYTOIN SODIUM EXTENDED 100 MG PO CAPS   Oral   Take 1 capsule (100 mg total) by mouth 3 (three) times daily.   90 capsule   0   . LEVETIRACETAM 500 MG PO TABS   Oral   Take 1 tablet (500 mg total) by mouth every 12 (twelve) hours.   30 tablet   0     BP 162/67  Pulse 76  Temp 98 F (36.7 C) (Oral)  Resp 18  SpO2 96%  Physical Exam  Constitutional: He appears well-developed and well-nourished.  HENT:  Head: Normocephalic.       eccymosis to left periorbital area. Depression to right superior forehead area.   Eyes: Pupils are equal, round, and reactive to light.  Cardiovascular: Normal rate and regular rhythm.   Pulmonary/Chest: Effort normal and breath sounds normal.  Abdominal: Soft.  Musculoskeletal: Normal range of motion.  Neurological: He is alert.  Skin: Rash noted.       Diffuse erethematous patches. Blanches with pressure. No sloughing of skin. No mucus membrane involvement.     ED Course  Procedures (including critical care time)  Labs Reviewed  COMPREHENSIVE METABOLIC PANEL - Abnormal; Notable for the following:    Potassium 5.2 (*)     Glucose, Bld 142 (*)     Creatinine, Ser 1.43 (*)     Total Bilirubin 0.2 (*)     GFR calc non Af Amer 47 (*)     GFR calc Af Amer 55 (*)     All other components within normal limits  CBC  POCT I-STAT TROPONIN I   Dg Chest 2 View  04/28/2012  *RADIOLOGY REPORT*  Clinical  Data: Dizziness.  CHEST - 2 VIEW  Comparison: 04/06/2011  Findings: Heart is mildly enlarged.  Mild peribronchial thickening. No confluent airspace opacities.  No effusions or acute bony abnormality.  IMPRESSION: Cardiomegaly.  Mild bronchitic changes.   Original Report Authenticated By: Rolm Baptise, M.D.      1. Medication reaction   2. Renal insufficiency   3. Hyperkalemia      Date: 04/28/2012  Rate: 69  Rhythm: normal sinus rhythm  QRS Axis: normal  Intervals: normal  ST/T Wave abnormalities: normal  Conduction Disutrbances:none  Narrative Interpretation:   Old EKG Reviewed: unchanged    MDM  Patient with rash. Likely related to Dilantin. Less likely thought to be Depakote will need to get followed. After discussion with neurology patient was started on Keppra and Dilantin was stopped. There is no mucous membrane involvement of the rash. Creatinine is minimally elevated as is potassium. He's been given some IV fluids and will follow with his primary care doctor on Friday.        Jasper Riling. Alvino Chapel, MD 04/28/12 [phone]

## 2012-07-28 ENCOUNTER — Ambulatory Visit (INDEPENDENT_AMBULATORY_CARE_PROVIDER_SITE_OTHER): Payer: Medicare Other | Admitting: Internal Medicine

## 2012-07-28 ENCOUNTER — Encounter: Payer: Self-pay | Admitting: Internal Medicine

## 2012-07-28 VITALS — BP 128/64 | HR 47 | Temp 98.0°F | Resp 16 | Ht 67.0 in | Wt 132.0 lb

## 2012-07-28 DIAGNOSIS — F17200 Nicotine dependence, unspecified, uncomplicated: Secondary | ICD-10-CM

## 2012-07-28 DIAGNOSIS — I10 Essential (primary) hypertension: Secondary | ICD-10-CM

## 2012-07-28 DIAGNOSIS — R569 Unspecified convulsions: Secondary | ICD-10-CM | POA: Insufficient documentation

## 2012-07-28 NOTE — Progress Notes (Signed)
  Subjective:    Patient ID: [name], male    DOB: [birth year], 72 y.o.   MRN: [id number]  HPI  Patient here by himself for follow up. He has been seeing dr Dellia Nims before. He is hyperactive today. Denies any recent seizure activity. Is aao x 3. Taking levetiractem only once a day. He does not want to take it twice a day and insists on not changing his medication. Denies any complaints this visit  Review of Systems  Constitutional: Negative for fever, chills, activity change, appetite change and fatigue.  HENT: Negative for ear pain and congestion.   Eyes: Negative for visual disturbance.       Wears corrective lenses  Respiratory: Negative for cough and shortness of breath.   Cardiovascular: Negative for chest pain, palpitations and leg swelling.  Gastrointestinal: Negative for abdominal pain and constipation.  Genitourinary: Negative for dysuria.  Musculoskeletal: Negative for arthralgias.  Neurological: Negative for dizziness, weakness and light-headedness.  Hematological: Negative for adenopathy.  Psychiatric/Behavioral: Negative for behavioral problems, sleep disturbance, self-injury and agitation. The patient is not nervous/anxious.        Objective:   Physical Exam  Constitutional: He is oriented to person, place, and time. He appears well-developed. No distress.  HENT:  Head: Normocephalic and atraumatic.  Eyes: Conjunctivae are normal. Pupils are equal, round, and reactive to light.  Neck: Normal range of motion. Neck supple. No JVD present. No thyromegaly present.  Cardiovascular: Normal rate, regular rhythm and normal heart sounds.   Pulmonary/Chest: Effort normal and breath sounds normal.  Abdominal: Soft. Bowel sounds are normal.  Musculoskeletal: Normal range of motion. He exhibits no edema and no tenderness.  Lymphadenopathy:    He has no cervical adenopathy.  Neurological: He is alert and oriented to person, place, and time. He has normal reflexes. No cranial  nerve deficit.  Skin: Skin is warm and dry. No rash noted. He is not diaphoretic.  Psychiatric: He has a normal mood and affect. His behavior is normal.   BP 128/64  Pulse 47  Temp(Src) 98 F (36.7 C) (Oral)  Resp 16  Ht 5\' 7"  (1.702 m)  Wt 132 lb (59.875 kg)  BMI 20.67 kg/m2  SpO2 96%     Assessment & Plan:   HTN- bp remains stable, no med changes, check bmp next visit  Seizures- no recent seizure activity. Continue current dose of divalproex and levetiractem and monitor for seizure activity. Explained the possible downside of being on low dose of medication but pt insists he is not going to make any changes. Has remained seizure free. Continue current meds for now  Tobacco user- counselled on cutting down on cigarettes and stopping smoking. Pt not willing at present.

## 2012-07-28 NOTE — Patient Instructions (Addendum)
Seizure, Adult A seizure means there is unusual activity in the brain. A seizure can cause changes in attention or behavior. Seizures often cause shaking (convulsions). Seizures often last from 30 seconds to 2 minutes. HOME CARE   If you are given medicines, take them exactly as told by your doctor.  Keep all doctor visits as told.  Do not swim or drive until your doctor says it is okay.  Teach others what to do if you have a seizure. They should:  Lay you on the ground.  Put a cushion under your head.  Loosen any tight clothing around your neck.  Turn you on your side.  Stay with you until you get better. GET HELP RIGHT AWAY IF:   The seizure lasts longer than 2 to 5 minutes.  The seizure is very bad.  The person does not wake up after the seizure.  The person's attention or behavior changes. Drive the person to the emergency room or call your local emergency services (911 in [place] MAKE SURE YOU:   Understand these instructions.  Will watch your condition.  Will get help right away if you are not doing well or get worse. Document Released: 09/10/2007 Document Revised: 06/16/2011 Document Reviewed: 03/12/2011 [facility] Patient Information 2013 [place].

## 2012-08-13 ENCOUNTER — Other Ambulatory Visit: Payer: Self-pay | Admitting: *Deleted

## 2012-08-13 MED ORDER — AMLODIPINE BESYLATE 10 MG PO TABS: ORAL_TABLET | ORAL | Status: DC

## 2012-11-01 ENCOUNTER — Other Ambulatory Visit: Payer: Medicare Other

## 2012-11-01 DIAGNOSIS — R569 Unspecified convulsions: Secondary | ICD-10-CM

## 2012-11-02 LAB — CBC WITH DIFFERENTIAL/PLATELET
Basophils Absolute: 0 10*3/uL (ref 0.0–0.2)
Eosinophils Absolute: 0 10*3/uL (ref 0.0–0.4)
HCT: 40.5 % (ref 37.5–51.0)
Hemoglobin: 13.6 g/dL (ref 12.6–17.7)
Lymphocytes Absolute: 2.5 10*3/uL (ref 0.7–3.1)
MCH: 31.4 pg (ref 26.6–33.0)
MCHC: 33.6 g/dL (ref 31.5–35.7)
MCV: 94 fL (ref 79–97)
Monocytes Absolute: 0.6 10*3/uL (ref 0.1–0.9)
Monocytes: 13 % — ABNORMAL HIGH (ref 4–12)
Neutrophils Absolute: 1.2 10*3/uL — ABNORMAL LOW (ref 1.4–7.0)

## 2012-11-02 LAB — COMPREHENSIVE METABOLIC PANEL
Albumin/Globulin Ratio: 1.8 (ref 1.1–2.5)
Calcium: 9.4 mg/dL (ref 8.6–10.2)
Creatinine, Ser: 1.32 mg/dL — ABNORMAL HIGH (ref 0.76–1.27)
GFR calc Af Amer: 61 mL/min/{1.73_m2} (ref >59)
GFR calc non Af Amer: 53 mL/min/{1.73_m2} — ABNORMAL LOW (ref >59)
Globulin, Total: 2.5 g/dL (ref 1.5–4.5)
Glucose: 101 mg/dL — ABNORMAL HIGH (ref 65–99)
Total Protein: 6.9 g/dL (ref 6.0–8.5)

## 2012-11-03 ENCOUNTER — Encounter: Payer: Self-pay | Admitting: Internal Medicine

## 2012-11-03 ENCOUNTER — Ambulatory Visit (INDEPENDENT_AMBULATORY_CARE_PROVIDER_SITE_OTHER): Payer: Medicare Other | Admitting: Internal Medicine

## 2012-11-03 VITALS — BP 136/82 | HR 57 | Temp 98.1°F | Resp 14 | Ht 67.0 in | Wt 126.4 lb

## 2012-11-03 DIAGNOSIS — I10 Essential (primary) hypertension: Secondary | ICD-10-CM

## 2012-11-03 DIAGNOSIS — F17200 Nicotine dependence, unspecified, uncomplicated: Secondary | ICD-10-CM

## 2012-11-03 DIAGNOSIS — R569 Unspecified convulsions: Secondary | ICD-10-CM

## 2012-11-03 MED ORDER — LEVETIRACETAM 500 MG PO TABS: 500.0000 mg | ORAL_TABLET | Freq: Every day | ORAL | Status: DC

## 2012-11-03 MED ORDER — DIVALPROEX SODIUM 250 MG PO DR TAB: 250.0000 mg | DELAYED_RELEASE_TABLET | Freq: Three times a day (TID) | ORAL | Status: DC

## 2012-11-03 NOTE — Progress Notes (Signed)
Patient ID: [name], male   DOB: 08/18/1940, 72 y.o.   MRN: [id number]   Allergies  Allergen Reactions  . Penicillins     Chief Complaint  Patient presents with  . Medical Managment of Chronic Issues    HPI:  Patient here by himself for follow up. Denies any recent seizure activity. Is aao x 3. Stopped taking levetiractem by himself after running out of the medication now for almost a month. He mentions feeling tired this am but otherwise his energy is fine.   Review of Systems  Constitutional: Negative for fever, chills, activity change, appetite change and fatigue.  HENT: Negative for ear pain and congestion.  no headache Eyes: Negative for visual disturbance.        Wears corrective lenses  Respiratory: Negative for cough and shortness of breath.   Cardiovascular: Negative for chest pain, palpitations and leg swelling.  Gastrointestinal: Negative for abdominal pain and constipation.  Genitourinary: Negative for dysuria.  Musculoskeletal: Negative for arthralgias.  Neurological: Negative for dizziness, weakness and light-headedness.  Hematological: Negative for adenopathy.  Psychiatric/Behavioral: Negative for behavioral problems, sleep disturbance, self-injury and agitation. The patient is not nervous/anxious.     Past Medical History  Diagnosis Date  . Hypertension   . Hyperchloremia   . Hypercholesteremia   . Aneurysm   . Seizures     has not had a seizure in 15 yrs  . Hyperpotassemia   . Hypertensive renal disease, benign   . COPD (chronic obstructive pulmonary disease)    Past Surgical History  Procedure Laterality Date  . 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  He does not have any smokeless tobacco history on file. He reports that  drinks alcohol. He reports that he does not use illicit drugs.  Family History  Problem Relation Age of Onset  . Cancer Mother   . Congestive Heart Failure Father      Medications: Patient's Medications  New Prescriptions   No medications on file  Previous Medications   AMLODIPINE (NORVASC) 10 MG TABLET    Take one tablet once tablet for blood pressure   DIVALPROEX (DEPAKOTE) 250 MG DR TABLET    Take 1 tablet (250 mg total) by mouth 3 (three) times daily.   LEVETIRACETAM (KEPPRA) 500 MG TABLET    Take 500 mg by mouth daily.  Modified Medications   No medications on file  Discontinued Medications   No medications on file     Physical Exam: Filed Vitals:   11/03/12 0905  BP: 136/82  Pulse: 57  Temp: 98.1 F (36.7 C)  TempSrc: Oral  Resp: 14  Height: 5\' 7"  (1.702 m)  Weight: 126 lb 6.4 oz (57.335 kg)   Constitutional: He is oriented to person, place, and time. He appears well-developed. No distress.  HENT:   Head: Normocephalic and atraumatic.  Eyes: Conjunctivae are normal. Pupils are equal, round, and reactive to light.  Neck: Normal range of motion. Neck supple. No JVD present. No thyromegaly present.  Cardiovascular: Normal rate, regular rhythm and normal heart sounds.   Pulmonary/Chest: Effort normal and breath sounds normal.  Abdominal: Soft. Bowel sounds are normal.  Musculoskeletal: Normal range of motion. He exhibits no edema and no tenderness.  Lymphadenopathy:    He has no cervical adenopathy.  Neurological: He is alert and oriented to person, place, and time. He has normal reflexes. No cranial nerve deficit.  Skin: Skin is  warm and dry. No rash noted. He is not diaphoretic.  Psychiatric: He has a normal mood and affect. His behavior is normal.    Labs reviewed: Basic Metabolic Panel:  Recent Labs  04/25/12 1240 04/28/12 1339 11/01/12 0900  NA 136 140 139  K 4.4 5.2* 4.4  CL 100 104 102  CO2 26 23 24   GLUCOSE 132* 142* 101*  BUN 18 19 16   CREATININE 1.22 1.43* 1.32*  CALCIUM 9.4 9.5 9.4   Liver Function Tests:  Recent Labs  04/25/12 1240 04/28/12 1339 11/01/12 0900  AST 25 18 23   ALT 19 13 12    ALKPHOS 74 68 75  BILITOT 0.3 0.2* 0.4  PROT 7.7 7.5 6.9  ALBUMIN 3.9 3.5  --    CBC:  Recent Labs  04/25/12 1240 04/28/12 1339 11/01/12 0900  WBC 5.5 5.0 4.3  NEUTROABS 2.4  --  1.2*  HGB 14.2 14.4 13.6  HCT 40.4 41.0 40.5  MCV 92.0 93.2 94  PLT 172 213  --     Assessment/Plan  HTN- bp remains stable, no med changes, reviewed bmp. Dietary counselling and exercise counselling provided  Seizures- no recent seizure activity. Continue current dose of divalproex and levetiractem once a day script provided.  lft within normal range  Tobacco user- counselled on cutting down on cigarettes and stopping smoking.    Labs/tests ordered- bmp, flp

## 2013-01-11 ENCOUNTER — Other Ambulatory Visit: Payer: Self-pay | Admitting: Nurse Practitioner

## 2013-01-14 ENCOUNTER — Encounter: Payer: Self-pay | Admitting: Internal Medicine

## 2013-01-27 ENCOUNTER — Encounter: Payer: Self-pay | Admitting: *Deleted

## 2013-01-31 ENCOUNTER — Other Ambulatory Visit: Payer: Self-pay | Admitting: Nurse Practitioner

## 2013-01-31 ENCOUNTER — Other Ambulatory Visit: Payer: Medicare Other

## 2013-01-31 DIAGNOSIS — R569 Unspecified convulsions: Secondary | ICD-10-CM

## 2013-01-31 DIAGNOSIS — I10 Essential (primary) hypertension: Secondary | ICD-10-CM

## 2013-02-01 LAB — BASIC METABOLIC PANEL
BUN/Creatinine Ratio: 12 (ref 10–22)
BUN: 14 mg/dL (ref 8–27)
Chloride: 96 mmol/L — ABNORMAL LOW (ref 97–108)
GFR calc Af Amer: 68 mL/min/{1.73_m2} (ref >59)

## 2013-02-01 LAB — LIPID PANEL
Chol/HDL Ratio: 2.4 ratio units (ref 0.0–5.0)
LDL Calculated: 121 mg/dL — ABNORMAL HIGH (ref 0–99)
Triglycerides: 106 mg/dL (ref 0–149)

## 2013-02-02 ENCOUNTER — Encounter: Payer: Self-pay | Admitting: Internal Medicine

## 2013-02-02 ENCOUNTER — Ambulatory Visit (INDEPENDENT_AMBULATORY_CARE_PROVIDER_SITE_OTHER): Payer: Medicare Other | Admitting: Internal Medicine

## 2013-02-02 VITALS — BP 144/70 | HR 60 | Temp 98.2°F | Resp 16 | Ht 67.0 in | Wt 122.4 lb

## 2013-02-02 DIAGNOSIS — Z23 Encounter for immunization: Secondary | ICD-10-CM

## 2013-02-02 DIAGNOSIS — N189 Chronic kidney disease, unspecified: Secondary | ICD-10-CM

## 2013-02-02 DIAGNOSIS — F17200 Nicotine dependence, unspecified, uncomplicated: Secondary | ICD-10-CM

## 2013-02-02 DIAGNOSIS — Z0000 Encounter for general adult medical examination without abnormal findings: Secondary | ICD-10-CM

## 2013-02-02 DIAGNOSIS — E785 Hyperlipidemia, unspecified: Secondary | ICD-10-CM

## 2013-02-02 DIAGNOSIS — I10 Essential (primary) hypertension: Secondary | ICD-10-CM | POA: Insufficient documentation

## 2013-02-02 DIAGNOSIS — R569 Unspecified convulsions: Secondary | ICD-10-CM

## 2013-02-02 DIAGNOSIS — I129 Hypertensive chronic kidney disease with stage 1 through stage 4 chronic kidney disease, or unspecified chronic kidney disease: Secondary | ICD-10-CM

## 2013-02-02 MED ORDER — TETANUS-DIPHTHERIA TOXOIDS TD 2-2 LF/0.5ML IM SUSP: 0.5000 mL | Freq: Once | INTRAMUSCULAR | Status: DC

## 2013-02-02 NOTE — Progress Notes (Signed)
Patient ID: [name], male   DOB: May 29, 1940, 72 y.o.   MRN: [id number]  Chief Complaint  Patient presents with  . Annual Exam    physical with labs printed, EKG done 04/28/12  . Immunizations    needs Pneumo & Tdap   Allergies  Allergen Reactions  . Penicillins     HPI:   Patient here with his brother for his physical exam. No recent seizure activity. He has been complaint with his medication. Blood pressure is under control. Reviewed his lab results with patient. Due for influenza and pneumococcal vaccine. Does not remember exactly but had colonosocpy several years back which was normal   Review of Systems   Constitutional: Negative for fever, chills, activity change, appetite change and fatigue.   HENT: Negative for ear pain and congestion.  no headache Eyes: Negative for visual disturbance.        Wears corrective lenses and has eye appointment in April 04 2013 Respiratory: Negative for cough and shortness of breath.    Cardiovascular: Negative for chest pain, palpitations and leg swelling.   Gastrointestinal: Negative for abdominal pain and constipation.  negative for melena Genitourinary: Negative for dysuria, hematuria, urinary incontinence, nocturia Musculoskeletal: Negative for arthralgias.   Neurological: Negative for dizziness, weakness and light-headedness. No seizures or loss of consciousness Hematological: Negative for adenopathy.   Psychiatric/Behavioral: Negative for behavioral problems, sleep disturbance, self-injury and agitation. The patient is not nervous/anxious.     Past Medical History  Diagnosis Date  . Hypertension   . Hyperchloremia   . Hypercholesteremia   . Aneurysm   . Seizures     has not had a seizure in 15 yrs  . Hyperpotassemia   . Hypertensive renal disease, benign   . COPD (chronic obstructive pulmonary disease)   . Tobacco use    Past Surgical History  Procedure Laterality Date  . Cerebral aneurysm repair  Mar 23, 1996   Family  History  Problem Relation Age of Onset  . Cancer Mother   . Congestive Heart Failure Father   . Congestive Heart Failure Sister   . Clotting disorder Sister   . Congestive Heart Failure Brother   . Clotting disorder Sister    History   Social History  . Marital Status: Single    Spouse Name: N/A    Number of Children: N/A  . Years of Education: N/A   Occupational History  . Not on file.   Social History Main Topics  . Smoking status: Current Every Day Smoker -- 0.50 packs/day for 56 years  . Smokeless tobacco: Not on file  . Alcohol Use: Yes     Comment: 6 pack a day  . Drug Use: No  . Sexual Activity: Not on file   Other Topics Concern  . Not on file   Social History Narrative  . No narrative on file   Current Outpatient Prescriptions on File Prior to Visit  Medication Sig Dispense Refill  . amLODipine (NORVASC) 10 MG tablet TAKE 1 TABLET ONCE A DAY FOR BLOOD PRESSURE  30 tablet  0  . divalproex (DEPAKOTE ER) 250 MG 24 hr tablet TAKE 1 TABLET BY MOUTH 3 TIMES A DAY FOR SEIZURES  90 tablet  2  . levETIRAcetam (KEPPRA) 500 MG tablet Take 1 tablet (500 mg total) by mouth daily.  90 tablet  3   No current facility-administered medications on file prior to visit.    BP 144/70  Pulse 60  Temp(Src) 98.2 F (36.8  C) (Oral)  Resp 16  Ht 5\' 7"  (1.702 m)  Wt 122 lb 6.4 oz (55.52 kg)  BMI 19.17 kg/m2  SpO2 98%  General- elderly male in no acute distress Head- atraumatic, has an indented area in frontal area of his head (old post repair of brain aneurysm) Eyes- PERRLA, EOMI, no pallor, no icterus Ears- normal external ear canal, normal tympanic membrane Neck- no lymphadenopathy, no thyromegaly, no jugular vein distension, no carotid bruit Chest- no chest wall deformities, no chest wall tenderness Cardiovascular- normal s1,s2, no murmurs/ rubs/ gallops Respiratory- bilateral clear to auscultation, no wheeze, no rhonchi, no crackles Abdomen- bowel sounds present, soft,  non tender, no organomegaly, no abdominal bruits, no guarding or rigidity, no CVA tenderness Musculoskeletal- able to move all 4 extremities, no spinal and paraspinal tenderness, steady gait, no use of assistive device Neurological- no focal deficit, normal reflexes, normal muscle strength, normal sensation to fine touch and vibration Psychiatry- alert and oriented to person, place and time, normal mood and affect  Labs reviewed  CBC    Component Value Date/Time   WBC 4.3 11/01/2012 0900   WBC 5.0 04/28/2012 1339   RBC 4.33 11/01/2012 0900   RBC 4.40 04/28/2012 1339   HGB 13.6 11/01/2012 0900   HCT 40.5 11/01/2012 0900   PLT 213 04/28/2012 1339   MCV 94 11/01/2012 0900   MCH 31.4 11/01/2012 0900   MCH 32.7 04/28/2012 1339   MCHC 33.6 11/01/2012 0900   MCHC 35.1 04/28/2012 1339   RDW 13.8 11/01/2012 0900   RDW 13.2 04/28/2012 1339   LYMPHSABS 2.5 11/01/2012 0900   LYMPHSABS 2.3 04/25/2012 1240   MONOABS 0.6 04/25/2012 1240   EOSABS 0.0 11/01/2012 0900   EOSABS 0.1 04/25/2012 1240   BASOSABS 0.0 11/01/2012 0900   BASOSABS 0.1 04/25/2012 1240    CMP     Component Value Date/Time   NA 137 01/31/2013 0825   NA 140 04/28/2012 1339   K 4.8 01/31/2013 0825   CL 96* 01/31/2013 0825   CO2 24 01/31/2013 0825   GLUCOSE 86 01/31/2013 0825   GLUCOSE 142* 04/28/2012 1339   BUN 14 01/31/2013 0825   BUN 19 04/28/2012 1339   CREATININE 1.21 01/31/2013 0825   CALCIUM 9.3 01/31/2013 0825   PROT 6.9 11/01/2012 0900   PROT 7.5 04/28/2012 1339   ALBUMIN 3.5 04/28/2012 1339   AST 23 11/01/2012 0900   ALT 12 11/01/2012 0900   ALKPHOS 75 11/01/2012 0900   BILITOT 0.4 11/01/2012 0900   GFRNONAA 59* 01/31/2013 0825   GFRAA 68 01/31/2013 0825   Lipid Panel     Component Value Date/Time   TRIG 106 01/31/2013 0825   HDL 103 01/31/2013 0825   CHOLHDL 2.4 01/31/2013 0825   LDLCALC 121* 01/31/2013 0825    Assessment/Plan  Seizures- no recent seizure activity. Continue current dose of divalproex and levetiractem  and check valproic acid level  HTN- bp remains stable, no med changes, reviewed bmp. Dietary counselling and exercise counselling provided  Hyperlipidemia- reviewed lipid panel. Dietary modification and exercise counselled  General exam- reviewed labs. To get influenza and pneumococcal vaccine today. fobt card provided. Reviewed ekg from jan 2014  Tobacco user- counselled on cutting down on cigarettes and stopping smoking  Hypertensive renal disease- under control. Check urine microalbumin

## 2013-02-03 LAB — MICROALBUMIN / CREATININE URINE RATIO: Microalbumin, Urine: 12.6 ug/mL (ref 0.0–17.0)

## 2013-02-07 ENCOUNTER — Other Ambulatory Visit: Payer: Medicare Other

## 2013-02-07 DIAGNOSIS — Z1211 Encounter for screening for malignant neoplasm of colon: Secondary | ICD-10-CM

## 2013-02-08 ENCOUNTER — Other Ambulatory Visit: Payer: Self-pay | Admitting: Nurse Practitioner

## 2013-02-08 NOTE — Telephone Encounter (Signed)
Last OV 02/02/2013

## 2013-03-07 ENCOUNTER — Other Ambulatory Visit: Payer: Self-pay | Admitting: Internal Medicine

## 2013-04-03 ENCOUNTER — Other Ambulatory Visit: Payer: Self-pay | Admitting: Nurse Practitioner

## 2013-08-01 ENCOUNTER — Other Ambulatory Visit: Payer: Medicare Other

## 2013-08-01 DIAGNOSIS — E785 Hyperlipidemia, unspecified: Secondary | ICD-10-CM

## 2013-08-01 DIAGNOSIS — R569 Unspecified convulsions: Secondary | ICD-10-CM

## 2013-08-01 DIAGNOSIS — I10 Essential (primary) hypertension: Secondary | ICD-10-CM

## 2013-08-01 DIAGNOSIS — I129 Hypertensive chronic kidney disease with stage 1 through stage 4 chronic kidney disease, or unspecified chronic kidney disease: Secondary | ICD-10-CM

## 2013-08-02 LAB — CBC WITH DIFFERENTIAL/PLATELET
BASOS: 1 %
Basophils Absolute: 0.1 10*3/uL (ref 0.0–0.2)
EOS: 2 %
Eosinophils Absolute: 0.1 10*3/uL (ref 0.0–0.4)
HEMATOCRIT: 40.3 % (ref 37.5–51.0)
Hemoglobin: 13.5 g/dL (ref 12.6–17.7)
Immature Grans (Abs): 0 10*3/uL (ref 0.0–0.1)
Immature Granulocytes: 0 %
LYMPHS ABS: 2.9 10*3/uL (ref 0.7–3.1)
Lymphs: 59 %
MCH: 31.6 pg (ref 26.6–33.0)
MCHC: 33.5 g/dL (ref 31.5–35.7)
MCV: 94 fL (ref 79–97)
MONOCYTES: 12 %
Monocytes Absolute: 0.6 10*3/uL (ref 0.1–0.9)
NEUTROS ABS: 1.3 10*3/uL — AB (ref 1.4–7.0)
Neutrophils Relative %: 26 %
RBC: 4.27 x10E6/uL (ref 4.14–5.80)
RDW: 14.4 % (ref 12.3–15.4)
WBC: 4.9 10*3/uL (ref 3.4–10.8)

## 2013-08-02 LAB — LIPID PANEL
CHOL/HDL RATIO: 2.1 ratio (ref 0.0–5.0)
Cholesterol, Total: 277 mg/dL — ABNORMAL HIGH (ref 100–199)
HDL: 131 mg/dL (ref >39)
LDL CALC: 137 mg/dL — AB (ref 0–99)
TRIGLYCERIDES: 45 mg/dL (ref 0–149)
VLDL CHOLESTEROL CAL: 9 mg/dL (ref 5–40)

## 2013-08-02 LAB — COMPREHENSIVE METABOLIC PANEL
ALT: 10 IU/L (ref 0–44)
AST: 23 IU/L (ref 0–40)
Albumin/Globulin Ratio: 1.8 (ref 1.1–2.5)
Albumin: 4.6 g/dL (ref 3.5–4.8)
Alkaline Phosphatase: 75 IU/L (ref 39–117)
BUN/Creatinine Ratio: 13 (ref 10–22)
BUN: 18 mg/dL (ref 8–27)
CHLORIDE: 103 mmol/L (ref 97–108)
CO2: 25 mmol/L (ref 18–29)
Calcium: 10.2 mg/dL (ref 8.6–10.2)
Creatinine, Ser: 1.35 mg/dL — ABNORMAL HIGH (ref 0.76–1.27)
GFR calc Af Amer: 59 mL/min/{1.73_m2} — ABNORMAL LOW (ref >59)
GFR calc non Af Amer: 51 mL/min/{1.73_m2} — ABNORMAL LOW (ref >59)
GLUCOSE: 111 mg/dL — AB (ref 65–99)
Globulin, Total: 2.6 g/dL (ref 1.5–4.5)
POTASSIUM: 6.1 mmol/L — AB (ref 3.5–5.2)
Sodium: 146 mmol/L — ABNORMAL HIGH (ref 134–144)
Total Bilirubin: 0.3 mg/dL (ref 0.0–1.2)
Total Protein: 7.2 g/dL (ref 6.0–8.5)

## 2013-08-03 ENCOUNTER — Encounter: Payer: Self-pay | Admitting: Internal Medicine

## 2013-08-03 ENCOUNTER — Ambulatory Visit (INDEPENDENT_AMBULATORY_CARE_PROVIDER_SITE_OTHER): Payer: Medicare Other | Admitting: Internal Medicine

## 2013-08-03 VITALS — BP 134/70 | HR 58 | Temp 98.0°F | Wt 125.6 lb

## 2013-08-03 DIAGNOSIS — E785 Hyperlipidemia, unspecified: Secondary | ICD-10-CM | POA: Insufficient documentation

## 2013-08-03 DIAGNOSIS — R569 Unspecified convulsions: Secondary | ICD-10-CM

## 2013-08-03 DIAGNOSIS — I129 Hypertensive chronic kidney disease with stage 1 through stage 4 chronic kidney disease, or unspecified chronic kidney disease: Secondary | ICD-10-CM

## 2013-08-03 DIAGNOSIS — E875 Hyperkalemia: Secondary | ICD-10-CM | POA: Insufficient documentation

## 2013-08-03 DIAGNOSIS — I10 Essential (primary) hypertension: Secondary | ICD-10-CM

## 2013-08-03 DIAGNOSIS — F17200 Nicotine dependence, unspecified, uncomplicated: Secondary | ICD-10-CM

## 2013-08-03 DIAGNOSIS — N189 Chronic kidney disease, unspecified: Secondary | ICD-10-CM

## 2013-08-03 MED ORDER — ATORVASTATIN CALCIUM 10 MG PO TABS: 10.0000 mg | ORAL_TABLET | Freq: Every day | ORAL | Status: DC

## 2013-08-03 MED ORDER — NICOTINE 14 MG/24HR TD PT24
14.0000 mg | MEDICATED_PATCH | Freq: Every day | TRANSDERMAL | Status: DC
Start: 2013-08-03 — End: 2013-11-02

## 2013-08-03 MED ORDER — NICOTINE 7 MG/24HR TD PT24: 7.0000 mg | MEDICATED_PATCH | Freq: Every day | TRANSDERMAL | Status: DC

## 2013-08-03 NOTE — Progress Notes (Signed)
Patient ID: [name], male   DOB: July 21, 1940, 73 y.o.   MRN: [id number]     Chief Complaint  Patient presents with  . Medical Management of Chronic Issues    6 month f/u & discuss labs (printed)  . Immunizations    declines shingles vaccine  . other    ready to quit smoking   Allergies  Allergen Reactions  . Penicillins     HPI 73 y/o male pt here with his brother for follow up visit. He remains seizure free. He is compliant with his medication. Denies any complaints this visit. He would like to quit smoking. Blood pressure is under control. Reviewed his lab results with patient. Last colonoscopy 5 years back  Review of Systems   Constitutional: Negative for fever, chills, activity change, appetite change and fatigue.   HENT: Negative for ear pain and congestion.  no headache Eyes: Negative for visual disturbance. Wears corrective lenses  Respiratory: Negative for cough and shortness of breath.    Cardiovascular: Negative for chest pain, palpitations and leg swelling.   Gastrointestinal: Negative for abdominal pain and constipation.  negative for melena Genitourinary: Negative for dysuria, hematuria, urinary incontinence, nocturia Musculoskeletal: Negative for arthralgias.   Neurological: Negative for dizziness, weakness and light-headedness. No seizures or loss of consciousness Hematological: Negative for adenopathy.   Psychiatric/Behavioral: Negative for behavioral problems, sleep disturbance, self-injury and agitation. The patient is not nervous/anxious.    Past Medical History  Diagnosis Date  . Hypertension   . Hyperchloremia   . Hypercholesteremia   . Aneurysm   . Seizures     has not had a seizure in 15 yrs  . Hyperpotassemia   . Hypertensive renal disease, benign   . COPD (chronic obstructive pulmonary disease)   . Tobacco use    Medication reviewed. 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) Eyes- PERRLA, EOMI, no pallor, no icterus Ears- normal external ear canal, normal tympanic membrane Neck- no lymphadenopathy, no thyromegaly, no jugular vein distension, no carotid bruit Chest- no chest wall deformities, no chest wall tenderness Cardiovascular- normal s1,s2, no murmurs/ rubs/ gallops Respiratory- bilateral clear to auscultation, no wheeze, no rhonchi, no crackles Abdomen- bowel sounds present, soft, non tender, no organomegaly, no abdominal bruits, no guarding or rigidity, no CVA tenderness Musculoskeletal- able to move all 4 extremities, no spinal and paraspinal tenderness, steady gait, no use of assistive device Neurological- no focal deficit, normal reflexes, normal muscle strength, normal sensation to fine touch and vibration Psychiatry- alert and oriented to person, place and time, normal mood and affect  Labs- CBC    Component Value Date/Time   WBC 4.9 08/01/2013 0921   WBC 5.0 04/28/2012 1339   RBC 4.27 08/01/2013 0921   RBC 4.40 04/28/2012 1339   HGB 13.5 08/01/2013 0921   HCT 40.3 08/01/2013 0921   PLT 213 04/28/2012 1339   MCV 94 08/01/2013 0921   MCH 31.6 08/01/2013 0921   MCH 32.7 04/28/2012 1339   MCHC 33.5 08/01/2013 0921   MCHC 35.1 04/28/2012 1339   RDW 14.4 08/01/2013 0921   RDW 13.2 04/28/2012 1339   LYMPHSABS 2.9 08/01/2013 0921   LYMPHSABS 2.3 04/25/2012 1240   MONOABS 0.6 04/25/2012 1240  EOSABS 0.1 08/01/2013 0921   EOSABS 0.1 04/25/2012 1240   BASOSABS 0.1 08/01/2013 0921   BASOSABS 0.1 04/25/2012 1240    CMP     Component Value Date/Time   NA 146* 08/01/2013 0921   NA 140 04/28/2012 1339   K 6.1* 08/01/2013 0921   CL 103 08/01/2013 0921   CO2 25 08/01/2013 0921   GLUCOSE 111* 08/01/2013 0921   GLUCOSE 142* 04/28/2012 1339   BUN 18 08/01/2013 0921   BUN 19 04/28/2012 1339   CREATININE 1.35*  08/01/2013 0921   CALCIUM 10.2 08/01/2013 0921   PROT 7.2 08/01/2013 0921   PROT 7.5 04/28/2012 1339   ALBUMIN 3.5 04/28/2012 1339   AST 23 08/01/2013 0921   ALT 10 08/01/2013 0921   ALKPHOS 75 08/01/2013 0921   BILITOT 0.3 08/01/2013 0921   GFRNONAA 51* 08/01/2013 0921   GFRAA 59* 08/01/2013 0921   Lipid Panel     Component Value Date/Time   TRIG 45 08/01/2013 0921   HDL 131 08/01/2013 0921   CHOLHDL 2.1 08/01/2013 0921   LDLCALC 137* 08/01/2013 0921   Assessment/plan  1. Tobacco use disorder Pt is willing to quit smoking. With him smoking half a pack a day, will have him on nicotine patch 14 mg a day for 4 weeks and then 7 mg patch once a day following this for 4 weeks and stop.  2. Seizures Remains seizure free. Continue divalproex 250 ER mg tid and keppra 500 mg daily. Adjust dose after checking level. Seizure precautions - Valproic Acid Level - Valproic Acid Level; Future  3. Hyperlipidemia LDL goal <130 Reviewed lipid panel. Will start him on lipitor 10 mg daily and check lft in 3 months. Dietary counselling and common side effect of med provided - CMP; Future  4. Essential hypertension, benign Stable. Continue amlodipine 10 mg daily - Basic Metabolic Panel  5. Hypertensive renal disease Monitor renal function , encouraged hydration and to avoid NSAIDs  6. Hyperkalemia Recheck k level today

## 2013-08-04 LAB — BASIC METABOLIC PANEL
BUN/Creatinine Ratio: 15 (ref 10–22)
BUN: 19 mg/dL (ref 8–27)
CO2: 26 mmol/L (ref 18–29)
Calcium: 9.9 mg/dL (ref 8.6–10.2)
Chloride: 101 mmol/L (ref 97–108)
Creatinine, Ser: 1.3 mg/dL — ABNORMAL HIGH (ref 0.76–1.27)
GFR calc Af Amer: 62 mL/min/{1.73_m2} (ref >59)
GFR calc non Af Amer: 54 mL/min/{1.73_m2} — ABNORMAL LOW (ref >59)
Glucose: 102 mg/dL — ABNORMAL HIGH (ref 65–99)
POTASSIUM: 6.3 mmol/L — AB (ref 3.5–5.2)
SODIUM: 144 mmol/L (ref 134–144)

## 2013-08-04 LAB — VALPROIC ACID LEVEL: Valproic Acid Lvl: 60 ug/mL (ref 50–100)

## 2013-08-08 ENCOUNTER — Other Ambulatory Visit: Payer: Self-pay | Admitting: *Deleted

## 2013-08-08 ENCOUNTER — Encounter: Payer: Self-pay | Admitting: *Deleted

## 2013-08-08 MED ORDER — DIVALPROEX SODIUM 250 MG PO DR TAB: 250.0000 mg | DELAYED_RELEASE_TABLET | Freq: Three times a day (TID) | ORAL | Status: DC

## 2013-08-08 MED ORDER — SODIUM POLYSTYRENE SULFONATE 15 GM/60ML PO SUSP: ORAL | Status: DC

## 2013-08-08 NOTE — Telephone Encounter (Signed)
New RX sent to the pharmacy and pt notified VIA phone to f/u with labs (bmp) 2 days after taking the Kayexalate.

## 2013-08-09 ENCOUNTER — Other Ambulatory Visit: Payer: Self-pay | Admitting: *Deleted

## 2013-08-09 DIAGNOSIS — E875 Hyperkalemia: Secondary | ICD-10-CM

## 2013-08-11 ENCOUNTER — Other Ambulatory Visit: Payer: Medicare Other

## 2013-08-11 DIAGNOSIS — E785 Hyperlipidemia, unspecified: Secondary | ICD-10-CM

## 2013-08-11 DIAGNOSIS — E875 Hyperkalemia: Secondary | ICD-10-CM

## 2013-08-11 DIAGNOSIS — R569 Unspecified convulsions: Secondary | ICD-10-CM

## 2013-08-12 LAB — COMPREHENSIVE METABOLIC PANEL
A/G RATIO: 1.8 (ref 1.1–2.5)
ALBUMIN: 4.6 g/dL (ref 3.5–4.8)
ALT: 13 IU/L (ref 0–44)
AST: 29 IU/L (ref 0–40)
Alkaline Phosphatase: 72 IU/L (ref 39–117)
BILIRUBIN TOTAL: 0.4 mg/dL (ref 0.0–1.2)
BUN/Creatinine Ratio: 14 (ref 10–22)
BUN: 18 mg/dL (ref 8–27)
CO2: 24 mmol/L (ref 18–29)
Calcium: 9.7 mg/dL (ref 8.6–10.2)
Chloride: 94 mmol/L — ABNORMAL LOW (ref 97–108)
Creatinine, Ser: 1.3 mg/dL — ABNORMAL HIGH (ref 0.76–1.27)
GFR calc non Af Amer: 54 mL/min/{1.73_m2} — ABNORMAL LOW (ref >59)
GFR, EST AFRICAN AMERICAN: 62 mL/min/{1.73_m2} (ref >59)
GLOBULIN, TOTAL: 2.6 g/dL (ref 1.5–4.5)
GLUCOSE: 102 mg/dL — AB (ref 65–99)
Potassium: 6 mmol/L — ABNORMAL HIGH (ref 3.5–5.2)
Sodium: 136 mmol/L (ref 134–144)
TOTAL PROTEIN: 7.2 g/dL (ref 6.0–8.5)

## 2013-08-12 LAB — VALPROIC ACID LEVEL: Valproic Acid Lvl: 67 ug/mL (ref 50–100)

## 2013-08-16 ENCOUNTER — Ambulatory Visit: Payer: Self-pay | Admitting: Internal Medicine

## 2013-08-16 ENCOUNTER — Ambulatory Visit (INDEPENDENT_AMBULATORY_CARE_PROVIDER_SITE_OTHER): Payer: Medicare Other | Admitting: Internal Medicine

## 2013-08-16 ENCOUNTER — Encounter: Payer: Self-pay | Admitting: Internal Medicine

## 2013-08-16 VITALS — BP 142/76 | HR 63 | Wt 125.0 lb

## 2013-08-16 DIAGNOSIS — E875 Hyperkalemia: Secondary | ICD-10-CM

## 2013-08-16 DIAGNOSIS — E876 Hypokalemia: Secondary | ICD-10-CM

## 2013-08-16 NOTE — Progress Notes (Signed)
Patient ID: [name], male   DOB: 01-13-1941, 73 y.o.   MRN: [id number]    Chief Complaint  Patient presents with  . Follow-up    hyperkalemia   Allergies  Allergen Reactions  . Penicillins     HPI 73 y/o male pt with history of seizure and HTN is here for lab follow up. His potassium level have been elevated with level being high even after giving kayexylate. He denies any chest pain, palpitations. His seizure meds had been changed from extended release to regular release but has not picked up the script.  He also has impaired renal function on review of his lab  ROS No fever or chills No headache Normal energy level Denies excessive exercise No dyspnea  Past Medical History  Diagnosis Date  . Hypertension   . Hyperchloremia   . Hypercholesteremia   . Aneurysm   . Seizures     has not had a seizure in 15 yrs  . Hyperpotassemia   . Hypertensive renal disease, benign   . COPD (chronic obstructive pulmonary disease)   . Tobacco use    Medication reviewed. 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) Eyes- PERRLA, EOMI, no pallor, no icterus Neck- no lymphadenopathy, no thyromegaly, no jugular vein distension, no carotid bruit Chest- no chest wall deformities, no chest wall tenderness Cardiovascular- normal s1,s2, no murmurs/ rubs/ gallops Respiratory- bilateral clear to auscultation, no wheeze, no rhonchi, no crackles Abdomen- bowel sounds present, soft, non tender, no organomegaly Musculoskeletal- able to move all 4 extremities, no spinal and paraspinal tenderness, steady gait, no use of assistive device Neurological- no focal deficit, normal reflexes, normal muscle strength, normal sensation to fine touch and vibration Psychiatry- alert and oriented to person, place and time, normal  mood and affect  Labs CMP     Component Value Date/Time   NA 136 08/11/2013 0942   NA 140 04/28/2012 1339   K 6.0* 08/11/2013 0942   CL 94* 08/11/2013 0942   CO2 24 08/11/2013 0942   GLUCOSE 102* 08/11/2013 0942   GLUCOSE 142* 04/28/2012 1339   BUN 18 08/11/2013 0942   BUN 19 04/28/2012 1339   CREATININE 1.30* 08/11/2013 0942   CALCIUM 9.7 08/11/2013 0942   PROT 7.2 08/11/2013 0942   PROT 7.5 04/28/2012 1339   ALBUMIN 3.5 04/28/2012 1339   AST 29 08/11/2013 0942   ALT 13 08/11/2013 0942   ALKPHOS 72 08/11/2013 0942   BILITOT 0.4 08/11/2013 0942   GFRNONAA 54* 08/11/2013 0942   GFRAA 62 08/11/2013 0942   08/16/13 ekg- NSR, left axis deviation, has peaked T waves in v4,5 and 6 which is new compared to his old EKG  STAT BMP has K OF 5.2, cr 1.32  Assessment/plan  1. hyperkalemia - EKG 12-Lead showing peaked T waves - Basic Metabolic Panel shows improved k level - Aldosterone level pending  With him being asymptomatic despite ekg changes, will monitor him clinically for now. Recheck bmp and ekg on routine follow up. Encourage hydration and to avoid k rich food for now. Likely from his impaired renal function

## 2013-08-16 NOTE — Patient Instructions (Signed)
Avoid aleve, advil and any other NSAIDS

## 2013-08-17 LAB — BASIC METABOLIC PANEL
BUN / CREAT RATIO: 15 (ref 10–22)
BUN: 20 mg/dL (ref 8–27)
CO2: 24 mmol/L (ref 18–29)
Calcium: 9.4 mg/dL (ref 8.6–10.2)
Chloride: 98 mmol/L (ref 96–108)
Creatinine, Ser: 1.32 mg/dL — ABNORMAL HIGH (ref 0.76–1.27)
GFR calc non Af Amer: 53 mL/min/{1.73_m2} — ABNORMAL LOW (ref >59)
GFR, EST AFRICAN AMERICAN: 61 mL/min/{1.73_m2} (ref >59)
Glucose: 82 mg/dL (ref 65–99)
POTASSIUM: 5.2 mmol/L (ref 3.5–5.2)
SODIUM: 137 mmol/L (ref 134–144)

## 2013-08-18 ENCOUNTER — Encounter: Payer: Self-pay | Admitting: *Deleted

## 2013-08-18 LAB — ALDOSTERONE: ALDOSTERONE: 7.4 ng/dL (ref 0.0–30.0)

## 2013-08-28 ENCOUNTER — Other Ambulatory Visit: Payer: Self-pay | Admitting: Internal Medicine

## 2013-09-05 ENCOUNTER — Other Ambulatory Visit: Payer: Self-pay | Admitting: Internal Medicine

## 2013-09-24 ENCOUNTER — Other Ambulatory Visit: Payer: Self-pay | Admitting: Internal Medicine

## 2013-10-04 ENCOUNTER — Other Ambulatory Visit: Payer: Self-pay | Admitting: *Deleted

## 2013-10-04 MED ORDER — LEVETIRACETAM 500 MG PO TABS: ORAL_TABLET | ORAL | Status: DC

## 2013-10-04 NOTE — Telephone Encounter (Signed)
[facility] 

## 2013-10-31 ENCOUNTER — Other Ambulatory Visit: Payer: Self-pay | Admitting: *Deleted

## 2013-10-31 ENCOUNTER — Other Ambulatory Visit: Payer: Medicare Other

## 2013-10-31 DIAGNOSIS — E785 Hyperlipidemia, unspecified: Secondary | ICD-10-CM

## 2013-10-31 DIAGNOSIS — R569 Unspecified convulsions: Secondary | ICD-10-CM

## 2013-10-31 DIAGNOSIS — I10 Essential (primary) hypertension: Secondary | ICD-10-CM

## 2013-11-02 ENCOUNTER — Ambulatory Visit: Payer: Medicare Other | Admitting: Internal Medicine

## 2013-11-02 ENCOUNTER — Encounter: Payer: Self-pay | Admitting: Internal Medicine

## 2013-11-02 ENCOUNTER — Ambulatory Visit (INDEPENDENT_AMBULATORY_CARE_PROVIDER_SITE_OTHER): Payer: Medicare Other | Admitting: Internal Medicine

## 2013-11-02 VITALS — BP 128/66 | HR 63 | Temp 98.6°F | Resp 10 | Ht 66.0 in | Wt 118.0 lb

## 2013-11-02 DIAGNOSIS — I10 Essential (primary) hypertension: Secondary | ICD-10-CM

## 2013-11-02 DIAGNOSIS — E785 Hyperlipidemia, unspecified: Secondary | ICD-10-CM

## 2013-11-02 DIAGNOSIS — E875 Hyperkalemia: Secondary | ICD-10-CM

## 2013-11-02 DIAGNOSIS — R569 Unspecified convulsions: Secondary | ICD-10-CM

## 2013-11-02 NOTE — Progress Notes (Signed)
Patient ID: [name], male   DOB: [birth year], 73 y.o.   MRN: [id number]    Chief Complaint  Patient presents with  . Medical Management of Chronic Issues    3 month follow-up, no recent labs (not fasting today)    Allergies  Allergen Reactions  . Penicillins    HPI:   Patient here with his brother for routine visit. No recent seizure activity. He has been complaint with his medication. Blood pressure is under control.   Review of Systems  Constitutional: Negative for fever, chills, weight loss, malaise/fatigue and diaphoresis.  HENT: Negative for congestion, hearing loss and sore throat.   Eyes: Negative for blurred vision, double vision and discharge. wears glasses Respiratory: Negative for cough, sputum production, shortness of breath and wheezing.   Cardiovascular: Negative for chest pain, palpitations, orthopnea and leg swelling.  Gastrointestinal: Negative for heartburn, nausea, vomiting, abdominal pain, diarrhea and constipation.  Genitourinary: Negative for dysuria, urgency, frequency and flank pain.  Musculoskeletal: Negative for back pain, joint pain and myalgias. slipped on the bathroom mat and hit his right leg on the tub and has a bruise Skin: Negative for itching and rash.  Neurological: Negative for dizziness, tingling, focal weakness and headaches. no recent seizures since last visit. Psychiatric/Behavioral: Negative for depression. the patient is not nervous/anxious.    Past Medical History  Diagnosis Date  . Hypertension   . Hyperchloremia   . Hypercholesteremia   . Aneurysm   . Seizures     has not had a seizure in 15 yrs  . Hyperpotassemia   . Hypertensive renal disease, benign   . COPD (chronic obstructive pulmonary disease)   . Tobacco use    Current Outpatient Prescriptions on File Prior to Visit  Medication Sig Dispense Refill  . amLODipine (NORVASC) 10 MG tablet TAKE 1 TABLET ONCE A DAY FOR BLOOD PRESSURE  30 tablet  5  . atorvastatin (LIPITOR)  10 MG tablet Take 1 tablet (10 mg total) by mouth daily.  30 tablet  3   No current facility-administered medications on file prior to visit.   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 Essential hypertension, benign Stable. Continue amlodipine 10 mg daily  2. Hyperkalemia Pt has been hydrating himself and avoiding canned food. No symptoms currently. Check bmp  3. Seizures Remains seizure free. Continue divalproex 250 mg tid and keppra 500 mg bid for now. Seizure precautions - Valproic Acid Level  Next visit annual exam

## 2013-11-03 LAB — COMPREHENSIVE METABOLIC PANEL
A/G RATIO: 1.4 (ref 1.1–2.5)
ALT: 12 IU/L (ref 0–44)
AST: 22 IU/L (ref 0–40)
Albumin: 4.1 g/dL (ref 3.5–4.8)
Alkaline Phosphatase: 82 IU/L (ref 39–117)
BILIRUBIN TOTAL: 0.3 mg/dL (ref 0.0–1.2)
BUN / CREAT RATIO: 14 (ref 10–22)
BUN: 21 mg/dL (ref 8–27)
CO2: 23 mmol/L (ref 18–29)
CREATININE: 1.51 mg/dL — AB (ref 0.76–1.27)
Calcium: 9.3 mg/dL (ref 8.6–10.2)
Chloride: 101 mmol/L (ref 97–108)
GFR, EST AFRICAN AMERICAN: 51 mL/min/{1.73_m2} — AB (ref >59)
GFR, EST NON AFRICAN AMERICAN: 45 mL/min/{1.73_m2} — AB (ref >59)
GLOBULIN, TOTAL: 2.9 g/dL (ref 1.5–4.5)
Glucose: 86 mg/dL (ref 65–99)
Potassium: 4.9 mmol/L (ref 3.5–5.2)
SODIUM: 140 mmol/L (ref 134–144)
Total Protein: 7 g/dL (ref 6.0–8.5)

## 2013-11-03 LAB — VALPROIC ACID LEVEL: Valproic Acid Lvl: 69 ug/mL (ref 50–100)

## 2013-11-15 ENCOUNTER — Other Ambulatory Visit: Payer: Self-pay | Admitting: *Deleted

## 2013-11-15 MED ORDER — LEVETIRACETAM 500 MG PO TABS: ORAL_TABLET | ORAL | Status: DC

## 2013-11-26 ENCOUNTER — Other Ambulatory Visit: Payer: Self-pay | Admitting: Internal Medicine

## 2013-12-07 ENCOUNTER — Other Ambulatory Visit: Payer: Self-pay | Admitting: Internal Medicine

## 2014-01-09 ENCOUNTER — Other Ambulatory Visit: Payer: Self-pay | Admitting: Internal Medicine

## 2014-01-12 ENCOUNTER — Encounter: Payer: Self-pay | Admitting: Internal Medicine

## 2014-01-12 ENCOUNTER — Ambulatory Visit (INDEPENDENT_AMBULATORY_CARE_PROVIDER_SITE_OTHER): Payer: Medicare Other | Admitting: Internal Medicine

## 2014-01-12 VITALS — BP 142/80 | HR 85 | Temp 98.4°F | Resp 20 | Ht 66.0 in | Wt 124.2 lb

## 2014-01-12 DIAGNOSIS — W182XXD Fall in (into) shower or empty bathtub, subsequent encounter: Secondary | ICD-10-CM

## 2014-01-12 DIAGNOSIS — L03115 Cellulitis of right lower limb: Secondary | ICD-10-CM

## 2014-01-12 DIAGNOSIS — Z23 Encounter for immunization: Secondary | ICD-10-CM

## 2014-01-12 DIAGNOSIS — S81811A Laceration without foreign body, right lower leg, initial encounter: Secondary | ICD-10-CM

## 2014-01-12 MED ORDER — DOXYCYCLINE HYCLATE 100 MG PO TABS: 100.0000 mg | ORAL_TABLET | Freq: Two times a day (BID) | ORAL | Status: DC

## 2014-01-12 NOTE — Patient Instructions (Signed)
Cellulitis Cellulitis is an infection of the skin and the tissue beneath it. The infected area is usually red and tender. Cellulitis occurs most often in the arms and lower legs.  CAUSES  Cellulitis is caused by bacteria that enter the skin through cracks or cuts in the skin. The most common types of bacteria that cause cellulitis are staphylococci and streptococci. SIGNS AND SYMPTOMS   Redness and warmth.  Swelling.  Tenderness or pain.  Fever. DIAGNOSIS  Your health care provider can usually determine what is wrong based on a physical exam. Blood tests may also be done. TREATMENT  Treatment usually involves taking an antibiotic medicine. HOME CARE INSTRUCTIONS   Take your antibiotic medicine as directed by your health care provider. Finish the antibiotic even if you start to feel better.  Keep the infected arm or leg elevated to reduce swelling.  Apply a warm cloth to the affected area up to 4 times per day to relieve pain.  Take medicines only as directed by your health care provider.  Keep all follow-up visits as directed by your health care provider. SEEK MEDICAL CARE IF:   You notice red streaks coming from the infected area.  Your red area gets larger or turns dark in color.  Your bone or joint underneath the infected area becomes painful after the skin has healed.  Your infection returns in the same area or another area.  You notice a swollen bump in the infected area.  You develop new symptoms.  You have a fever. SEEK IMMEDIATE MEDICAL CARE IF:   You feel very sleepy.  You develop vomiting or diarrhea.  You have a general ill feeling (malaise) with muscle aches and pains. MAKE SURE YOU:   Understand these instructions.  Will watch your condition.  Will get help right away if you are not doing well or get worse. Document Released: 01/01/2005 Document Revised: 08/08/2013 Document Reviewed: 06/09/2011 [facility] Patient Information 2015 [place], [place].  This information is not intended to replace advice given to you by your health care provider. Make sure you discuss any questions you have with your health care provider.  Please take the 10 day course of antibiotics I prescribed (doxycycline).   Please eat yogurt daily while you are taking the doxycycline.  This will keep the good bacteria in your bowels and help to prevent diarrhea. Also avoid direct sunlight while on the antibiotic.

## 2014-01-12 NOTE — Progress Notes (Signed)
Patient ID: [name], male   DOB: 1940/11/11, 73 y.o.   MRN: [id number]   Location:  [facility]   Allergies  Allergen Reactions  . Penicillins     Chief Complaint  Patient presents with  . Acute Visit    leg injury- fell in the shower 2 weeks ago( reddness swelling)    HPI: Patient is a 73 y.o. male seen in the office today for an acute visit due to leg pain and swelling with redness s/p fall in the shower two weeks ago.  He's been taking care of it on his own up to this point.  Review of Systems:  Review of Systems  Constitutional: Negative for fever, chills and malaise/fatigue.  Musculoskeletal: Positive for falls.  Skin:       Redness, warmth, tenderness of right shin surrounding two small lacerations, only serosanguinous drainage, no odor  Neurological: Negative for weakness.     Past Medical History  Diagnosis Date  . Hypertension   . Hyperchloremia   . Hypercholesteremia   . Aneurysm   . Seizures     has not had a seizure in 15 yrs  . Hyperpotassemia   . Hypertensive renal disease, benign   . COPD (chronic obstructive pulmonary disease)   . Tobacco use     Past Surgical History  Procedure Laterality Date  . 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  He does not have any smokeless tobacco history on file. He reports that he drinks alcohol. He reports that he does not use illicit drugs.  Family History  Problem Relation Age of Onset  . Cancer Mother   . Congestive Heart Failure Father   . Congestive Heart Failure Sister   . Clotting disorder Sister   . Congestive Heart Failure Brother   . Clotting disorder Sister     Medications: Patient's Medications  New Prescriptions   No medications on file  Previous Medications   AMLODIPINE (NORVASC) 10 MG TABLET    TAKE 1 TABLET ONCE A DAY FOR BLOOD PRESSURE   ATORVASTATIN (LIPITOR) 10 MG TABLET    TAKE 1 TABLET  (10 MG TOTAL) BY MOUTH DAILY.   DIVALPROEX (DEPAKOTE) 250 MG DR TABLET    Take 1 tablet 3 times a daily by mouth for Seizures   DIVALPROEX (DEPAKOTE) 250 MG DR TABLET    TAKE 1 TABLET BY MOUTH 3 TIMES A DAY FOR SEIZURES   LEVETIRACETAM (KEPPRA) 500 MG TABLET    TAKE ONE TABLET BY MOUTH TWO TIMES DAILY  Modified Medications   No medications on file  Discontinued Medications   No medications on file     Physical Exam: Filed Vitals:   01/12/14 1017  BP: 142/80  Pulse: 85  Temp: 98.4 F (36.9 C)  TempSrc: Oral  Resp: 20  Height: 5\' 6"  (1.676 m)  Weight: 124 lb 3.2 oz (56.337 kg)  SpO2: 98%  Physical Exam  Constitutional: No distress.  Cardiovascular: Normal rate and regular rhythm.   Pulmonary/Chest: Effort normal and breath sounds normal.  Neurological:  Twitch of right eye  Skin: Skin is warm.  Right leg with two small lacerations of shin surrounded by erythema, increased warmth and tenderness    Labs reviewed: Basic Metabolic Panel:  Recent Labs  08/11/13 0942 08/16/13 1226 11/02/13 1135  NA 136 137 140  K 6.0* 5.2 4.9  CL 94* 98  101  CO2 24 24 23   GLUCOSE 102* 82 86  BUN 18 20 21   CREATININE 1.30* 1.32* 1.51*  CALCIUM 9.7 9.4 9.3   Liver Function Tests:  Recent Labs  08/01/13 0921 08/11/13 0942 11/02/13 1135  AST 23 29 22   ALT 10 13 12   ALKPHOS 75 72 82  BILITOT 0.3 0.4 0.3  PROT 7.2 7.2 7.0  CBC:  Recent Labs  08/01/13 0921  WBC 4.9  NEUTROABS 1.3*  HGB 13.5  HCT 40.3  MCV 94   Lipid Panel:  Recent Labs  01/31/13 0825 08/01/13 0921  HDL 103 131  LDLCALC 121* 137*  TRIG 106 45  CHOLHDL 2.4 2.1   No results found for this basename: HGBA1C    Assessment/Plan 1. Cellulitis of leg, right - due to leg laceration - doxycycline (VIBRA-TABS) 100 MG tablet; Take 1 tablet (100 mg total) by mouth 2 (two) times daily.  Dispense: 20 tablet; Refill: 0 -advised to eat yogurt with it and avoid sunshine  2. Leg laceration, right, initial  encounter -keep clean and dry -was cleansed with saline and telfa applied with kerlix wrap  3. Fall in (into) shower or empty bathtub, subsequent encounter -has made modifications with replacement   4.  Need for influenza vaccine -flu shot given  Labs/tests ordered:  none Next appt:  Prn and keep regular visit with Dr. Sima Matas L. Aydan Levitz, D.O. [facility] [address], [place] [address] Cell Phone (Mon-Fri 8am-5pm):  [phone] On Call:  [phone] & follow prompts after 5pm & weekends Office Phone:  [phone] Office Fax:  [phone]

## 2014-01-19 ENCOUNTER — Ambulatory Visit (INDEPENDENT_AMBULATORY_CARE_PROVIDER_SITE_OTHER): Payer: Medicare Other | Admitting: Internal Medicine

## 2014-01-19 ENCOUNTER — Encounter: Payer: Self-pay | Admitting: Internal Medicine

## 2014-01-19 VITALS — BP 138/70 | HR 78 | Temp 97.4°F

## 2014-01-19 DIAGNOSIS — L03115 Cellulitis of right lower limb: Secondary | ICD-10-CM

## 2014-01-19 DIAGNOSIS — R600 Localized edema: Secondary | ICD-10-CM

## 2014-01-19 DIAGNOSIS — S81811D Laceration without foreign body, right lower leg, subsequent encounter: Secondary | ICD-10-CM

## 2014-01-19 NOTE — Progress Notes (Signed)
Patient ID: [name], male   DOB: 1940-10-06, 73 y.o.   MRN: [id number]   Location:  [facility]   Allergies  Allergen Reactions  . Penicillins     Chief Complaint  Patient presents with  . Acute Visit    Ongoing right leg concerns     HPI: Patient is a 73 y.o. black male seen in the office today for  Review of Systems:  Review of Systems  Respiratory: Negative for shortness of breath.   Cardiovascular: Positive for leg swelling. Negative for chest pain and palpitations.  Genitourinary: Negative for dysuria, urgency and frequency.  Skin:       Right leg lacerations with eschar now; no longer as warm in surrounding area; remains swollen   Past Medical History  Diagnosis Date  . Hypertension   . Hyperchloremia   . Hypercholesteremia   . Aneurysm   . Seizures     has not had a seizure in 15 yrs  . Hyperpotassemia   . Hypertensive renal disease, benign   . COPD (chronic obstructive pulmonary disease)   . Tobacco use     Past Surgical History  Procedure Laterality Date  . 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  He does not have any smokeless tobacco history on file. He reports that he drinks alcohol. He reports that he does not use illicit drugs.  Family History  Problem Relation Age of Onset  . Cancer Mother   . Congestive Heart Failure Father   . Congestive Heart Failure Sister   . Clotting disorder Sister   . Congestive Heart Failure Brother   . Clotting disorder Sister     Medications: Patient's Medications  New Prescriptions   No medications on file  Previous Medications   AMLODIPINE (NORVASC) 10 MG TABLET    TAKE 1 TABLET ONCE A DAY FOR BLOOD PRESSURE   ATORVASTATIN (LIPITOR) 10 MG TABLET    TAKE 1 TABLET (10 MG TOTAL) BY MOUTH DAILY.   DIVALPROEX (DEPAKOTE) 250 MG DR TABLET    TAKE 1 TABLET BY MOUTH 3 TIMES A DAY FOR SEIZURES   DOXYCYCLINE (VIBRA-TABS)  100 MG TABLET    Take 1 tablet (100 mg total) by mouth 2 (two) times daily.   LEVETIRACETAM (KEPPRA) 500 MG TABLET    TAKE ONE TABLET BY MOUTH TWO TIMES DAILY  Modified Medications   No medications on file  Discontinued Medications   DIVALPROEX (DEPAKOTE) 250 MG DR TABLET    Take 1 tablet 3 times a daily by mouth for Seizures     Physical Exam: Filed Vitals:   01/19/14 1501  BP: 138/70  Pulse: 78  Temp: 97.4 F (36.3 C)  SpO2: 92%  Physical Exam  Constitutional: He appears well-developed and well-nourished. No distress.  Cardiovascular: Normal rate, regular rhythm and normal heart sounds.   Pulmonary/Chest: Effort normal and breath sounds normal.  Musculoskeletal: Normal range of motion.  Neurological: He is alert.  Skin:  Right leg with two lacerations, mild surrounding erythema but less than last week, is edematous vs. Left leg     Labs reviewed: Basic Metabolic Panel:  Recent Labs  08/11/13 0942 08/16/13 1226 11/02/13 1135  NA 136 137 140  K 6.0* 5.2 4.9  CL 94* 98 101  CO2 24 24 23   GLUCOSE 102* 82 86  BUN 18 20 21   CREATININE 1.30* 1.32* 1.51*  CALCIUM 9.7 9.4 9.3   Liver Function Tests:  Recent Labs  08/01/13 0921 08/11/13 0942 11/02/13 1135  AST 23 29 22   ALT 10 13 12   ALKPHOS 75 72 82  BILITOT 0.3 0.4 0.3  PROT 7.2 7.2 7.0   No results found for this basename: LIPASE, AMYLASE,  in the last 8760 hours No results found for this basename: AMMONIA,  in the last 8760 hours CBC:  Recent Labs  08/01/13 0921  WBC 4.9  NEUTROABS 1.3*  HGB 13.5  HCT 40.3  MCV 94   Lipid Panel:  Recent Labs  01/31/13 0825 08/01/13 0921  HDL 103 131  LDLCALC 121* 137*  TRIG 106 45  CHOLHDL 2.4 2.1    Assessment/Plan 1. Edema of right lower extremity - remains despite otherwise improvement in RLE cellulitis so want to r/o dvt - Lower Extremity Venous Duplex Right; Future  2. Cellulitis of leg, right -improving with doxycycline -his family member  with him says he is not propping his leg up and resting as directed--encouraged this again today  3. Leg laceration, right, subsequent encounter -improved with eschars now formed, less tender   Labs/tests ordered:   Orders Placed This Encounter  Procedures  . Lower Extremity Venous Duplex Right    Standing Status: Future     Number of Occurrences:      Standing Expiration Date: 01/20/2015    Order Specific Question:  Laterality    Answer:  Right    Order Specific Question:  Where should this test be performed:    Answer:  [facility]    Next appt:  F/u prn not improving  Joash Tony L. Heavenly Christine, D.O. [facility] [address], [place] [address] Cell Phone (Mon-Fri 8am-5pm):  [phone] On Call:  [phone] & follow prompts after 5pm & weekends Office Phone:  [phone] Office Fax:  [phone]

## 2014-01-19 NOTE — Patient Instructions (Signed)
Please finish off your last two days of doxycycline. Elevate your leg at rest.    Please get the ultrasound done at the hospital to rule out blood clot (DVT) in your right leg.

## 2014-01-20 ENCOUNTER — Telehealth: Payer: Self-pay | Admitting: *Deleted

## 2014-01-20 ENCOUNTER — Ambulatory Visit (HOSPITAL_COMMUNITY)
Admission: RE | Admit: 2014-01-20 | Discharge: 2014-01-20 | Disposition: A | Discharge status: Home / Self Care | Payer: Medicare Other | Source: Ambulatory Visit | Attending: Internal Medicine | Admitting: Internal Medicine

## 2014-01-20 DIAGNOSIS — R600 Localized edema: Secondary | ICD-10-CM | POA: Diagnosis not present

## 2014-01-20 DIAGNOSIS — M79604 Pain in right leg: Secondary | ICD-10-CM | POA: Insufficient documentation

## 2014-01-20 DIAGNOSIS — R609 Edema, unspecified: Secondary | ICD-10-CM

## 2014-01-20 NOTE — Telephone Encounter (Signed)
[place] from the [facility] [place] department called a report on venous doppler result on the right leg. The report was negative, result was given to Dr. Mariea Clonts, patient was told to continue to elevate leg.

## 2014-01-20 NOTE — Progress Notes (Signed)
VASCULAR LAB PRELIMINARY  PRELIMINARY  PRELIMINARY  PRELIMINARY  Right lower extremity venous duplex completed.    Preliminary report:  Bilateral:  No evidence of DVT, superficial thrombosis, or Baker's Cyst.   Faust Thorington, RVS 01/20/2014, 10:53 AM

## 2014-03-06 ENCOUNTER — Other Ambulatory Visit: Payer: Self-pay | Admitting: Internal Medicine

## 2014-03-07 ENCOUNTER — Encounter: Payer: Self-pay | Admitting: Internal Medicine

## 2014-03-07 ENCOUNTER — Ambulatory Visit (INDEPENDENT_AMBULATORY_CARE_PROVIDER_SITE_OTHER): Payer: Medicare Other | Admitting: Internal Medicine

## 2014-03-07 VITALS — BP 148/80 | HR 80 | Temp 98.1°F | Resp 12 | Ht 66.0 in | Wt 122.0 lb

## 2014-03-07 DIAGNOSIS — I10 Essential (primary) hypertension: Secondary | ICD-10-CM

## 2014-03-07 DIAGNOSIS — R569 Unspecified convulsions: Secondary | ICD-10-CM

## 2014-03-07 DIAGNOSIS — Z0000 Encounter for general adult medical examination without abnormal findings: Secondary | ICD-10-CM

## 2014-03-07 DIAGNOSIS — Z720 Tobacco use: Secondary | ICD-10-CM

## 2014-03-07 DIAGNOSIS — E785 Hyperlipidemia, unspecified: Secondary | ICD-10-CM

## 2014-03-07 DIAGNOSIS — F17200 Nicotine dependence, unspecified, uncomplicated: Secondary | ICD-10-CM

## 2014-03-07 DIAGNOSIS — I739 Peripheral vascular disease, unspecified: Secondary | ICD-10-CM

## 2014-03-07 DIAGNOSIS — Z1211 Encounter for screening for malignant neoplasm of colon: Secondary | ICD-10-CM

## 2014-03-07 DIAGNOSIS — R4189 Other symptoms and signs involving cognitive functions and awareness: Secondary | ICD-10-CM

## 2014-03-07 NOTE — Progress Notes (Signed)
Patient ID: [name], male   DOB: [birth year], 73 y.o.   MRN: [id number]    Chief Complaint  Patient presents with  . Annual Exam    Yearly check-up, EKG completed 08/16/13, no recent labs   . MMSE    27/30, Failed clock drawing    Allergies  Allergen Reactions  . Penicillins    HPI 73 y/o male pt is here for annual exam. He denies any concerns this visit. Denies any new seizure activity. Compliant with his medications. He had a fall and cellulitis of his right leg. He has completed his antibiotic treatment He has pmh of HLD, HTN, seizures  He walks for exercise Has not seen eye doctor for more than a year uptodate on immunization  Immunization History  Administered Date(s) Administered  . Influenza,inj,Quad PF,36+ Mos 01/19/2014  . Pneumococcal Polysaccharide-23 02/02/2013  . Td 04/07/2008    Wt Readings from Last 3 Encounters:  03/07/14 122 lb (55.339 kg)  01/12/14 124 lb 3.2 oz (56.337 kg)  11/02/13 118 lb (53.524 kg)    Review of Systems  Constitutional: Negative for fever, chills, weight loss, malaise/fatigue and diaphoresis.  HENT: Negative for congestion, hearing loss and sore throat.   Eyes: Negative for blurred vision, double vision and discharge.  Respiratory: Negative for cough, sputum production, shortness of breath and wheezing.   Cardiovascular: Negative for chest pain, palpitations, orthopnea and leg swelling.  Gastrointestinal: Negative for heartburn, nausea, vomiting, abdominal pain, diarrhea and constipation.  Genitourinary: Negative for dysuria, urgency, frequency and flank pain.  Musculoskeletal: Negative for back pain, falls, joint pain and myalgias.  Skin: Negative for itching and rash.  Neurological: Negative for dizziness, tingling, focal weakness and headaches.  Psychiatric/Behavioral: Negative for depression and memory loss. The patient is not nervous/anxious.   Past Medical History  Diagnosis Date  . Hypertension   . Hyperchloremia   .  Hypercholesteremia   . Aneurysm   . Seizures     has not had a seizure in 15 yrs  . Hyperpotassemia   . Hypertensive renal disease, benign   . COPD (chronic obstructive pulmonary disease)   . Tobacco use    Past Surgical History  Procedure Laterality Date  . 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 amLODipine (NORVASC) 10 MG tablet TAKE 1 TABLET ONCE A DAY FOR BLOOD PRESSURE 30 tablet 5  . atorvastatin (LIPITOR) 10 MG tablet TAKE 1 TABLET (10 MG TOTAL) BY MOUTH DAILY. 30 tablet 3  . divalproex (DEPAKOTE) 250 MG DR tablet TAKE 1 TABLET BY MOUTH 3 TIMES A DAY FOR SEIZURES 90 tablet 3  . levETIRAcetam (KEPPRA) 500 MG tablet TAKE ONE TABLET BY MOUTH TWO TIMES DAILY 60 tablet 1  . levETIRAcetam (KEPPRA) 500 MG tablet TAKE ONE TABLET BY MOUTH TWO TIMES DAILY 60 tablet 1   No current facility-administered medications on file prior to visit.   Family History  Problem Relation Age of Onset  . Cancer Mother   . Congestive Heart Failure Father   . Congestive Heart Failure Sister   . Clotting disorder Sister   . Congestive Heart Failure Brother   . Clotting disorder Sister    History   Social History  . Marital Status: Single    Spouse Name: N/A    Number of Children: N/A  . Years of Education: N/A   Occupational History  . Not on file.  Social History Main Topics  . Smoking status: Current Every Day Smoker -- 56 years  . Smokeless tobacco: Not on file     Comment: On average about 6 cig daily   . Alcohol Use: 0.0 oz/week    0 Not specified per week     Comment: 5-6 beers daily   . Drug Use: No  . Sexual Activity: Not on file   Other Topics Concern  . Not on file   Social History Narrative   Physical exam BP 148/80 mmHg  Pulse 80  Temp(Src) 98.1 F (36.7 C) (Oral)  Resp 12  Ht 5\' 6"  (1.676 m)  Wt 122 lb (55.339 kg)  BMI 19.70 kg/m2  SpO2 99%  Repeat bp  130/84  General- elderly male in no acute distress Head- atraumatic, normocephalic, has a craniotomy dent on his scalp Eyes- PERRLA, EOMI, no pallor, no icterus Mouth- upper dentures, lower partial denture, normal oropharynx Neck- no lymphadenopathy, no thyromegaly, no jugular vein distension, no carotid bruit Chest- no chest wall deformities, no chest wall tenderness Cardiovascular- normal s1,s2, no murmurs/ rubs/ gallops Respiratory- bilateral clear to auscultation, no wheeze, no rhonchi, no crackles Abdomen- bowel sounds present, soft, non tender, no organomegaly, no abdominal bruits, no guarding or rigidity, no CVA tenderness Musculoskeletal- able to move all 4 extremities, no spinal and paraspinal tenderness, steady gait, no use of assistive device. Loss of hair in legs, mild erythema in right leg, old wound scab, normal temperature, good distal pulses in both feet Neurological- no focal deficit, normal reflexes, normal muscle strength, normal sensation to fine touch and vibration Psychiatry- alert and oriented to person, place and time, normal mood and affect  Labs- CBC    Component Value Date/Time   WBC 4.9 08/01/2013 0921   WBC 5.0 04/28/2012 1339   RBC 4.27 08/01/2013 0921   RBC 4.40 04/28/2012 1339   HGB 13.5 08/01/2013 0921   HCT 40.3 08/01/2013 0921   PLT 213 04/28/2012 1339   MCV 94 08/01/2013 0921   MCH 31.6 08/01/2013 0921   MCH 32.7 04/28/2012 1339   MCHC 33.5 08/01/2013 0921   MCHC 35.1 04/28/2012 1339   RDW 14.4 08/01/2013 0921   RDW 13.2 04/28/2012 1339   LYMPHSABS 2.9 08/01/2013 0921   LYMPHSABS 2.3 04/25/2012 1240   MONOABS 0.6 04/25/2012 1240   EOSABS 0.1 08/01/2013 0921   EOSABS 0.1 04/25/2012 1240   BASOSABS 0.1 08/01/2013 0921   BASOSABS 0.1 04/25/2012 1240    CMP Latest Ref Rng 11/02/2013 08/16/2013 08/11/2013  Glucose 65 - 99 mg/dL 86 82 102(H)  BUN 8 - 27 mg/dL 21 20 18   Creatinine 0.76 - 1.27 mg/dL 1.51(H) 1.32(H) 1.30(H)  Sodium 134 - 144 mmol/L  140 137 136  Potassium 3.5 - 5.2 mmol/L 4.9 5.2 6.0(H)  Chloride 97 - 108 mmol/L 101 98 94(L)  CO2 18 - 29 mmol/L 23 24 24   Calcium 8.6 - 10.2 mg/dL 9.3 9.4 9.7  Total Protein 6.0 - 8.5 g/dL 7.0 - 7.2  Albumin 3.5 - 4.8 g/dL 4.1 - 4.6  Total Bilirubin 0.0 - 1.2 mg/dL 0.3 - 0.4  Alkaline Phos 39 - 117 IU/L 82 - 72  AST 0 - 40 IU/L 22 - 29  ALT 0 - 44 IU/L 12 - 13   Lipid Panel     Component Value Date/Time   TRIG 45 08/01/2013 0921   HDL 131 08/01/2013 0921   CHOLHDL 2.1 08/01/2013 0921   LDLCALC 137* 08/01/2013 [id number]  03/07/14 mmse 27/30, failed clock draw  Assessment/plan  1. Colon cancer screening - Fecal occult blood, imunochemical; Future  2. Cognitive impairment Rule out reversible causes. His seizure, craniotomy and HTN overtime could be contributing to this. Pleasantly carries a conversation, lives with his brother, does not drive, here with his sister - TSH - Vitamin B12 - CMP  3. Essential hypertension, benign Repeat check normal bp, continue amlodipine current regimen  4. Hyperlipidemia LDL goal <130 Stable, continue lipitor 10 mg daily  5. Routine general medical examination at a health care facility the patient was counseled regarding prevention of dental and periodontal disease, diet, regular sustained exercise for at least 30 minutes 5 times per week, the proper use of sunscreen and protective clothing, tobacco use, and recommended schedule for GI hemoccult testing, colonoscopy, cholesterol, thyroid and diabetes screening.  6. Seizures Continue keppra and depakote, monitor clincially  7. Tobacco use disorder counselled on cessation, pt not willing at present  8. PVD (peripheral vascular disease) pvd changes on leg exam, denies claudication. Monitor clinically

## 2014-03-07 NOTE — Progress Notes (Signed)
Failed clock drawing  

## 2014-03-08 LAB — COMPREHENSIVE METABOLIC PANEL
ALT: 10 IU/L (ref 0–44)
AST: 20 IU/L (ref 0–40)
Albumin/Globulin Ratio: 1.4 (ref 1.1–2.5)
Albumin: 4.3 g/dL (ref 3.5–4.8)
Alkaline Phosphatase: 94 IU/L (ref 39–117)
BUN/Creatinine Ratio: 21 (ref 10–22)
BUN: 34 mg/dL — ABNORMAL HIGH (ref 8–27)
CO2: 23 mmol/L (ref 18–29)
Calcium: 9.4 mg/dL (ref 8.6–10.2)
Chloride: 103 mmol/L (ref 97–108)
Creatinine, Ser: 1.6 mg/dL — ABNORMAL HIGH (ref 0.76–1.27)
GFR calc Af Amer: 48 mL/min/{1.73_m2} — ABNORMAL LOW (ref >59)
GFR calc non Af Amer: 42 mL/min/{1.73_m2} — ABNORMAL LOW (ref >59)
Globulin, Total: 3.1 g/dL (ref 1.5–4.5)
Glucose: 89 mg/dL (ref 65–99)
POTASSIUM: 5 mmol/L (ref 3.5–5.2)
SODIUM: 144 mmol/L (ref 134–144)
Total Bilirubin: 0.2 mg/dL (ref 0.0–1.2)
Total Protein: 7.4 g/dL (ref 6.0–8.5)

## 2014-03-08 LAB — TSH: TSH: 1.51 u[IU]/mL (ref 0.450–4.500)

## 2014-03-08 LAB — VITAMIN B12: Vitamin B-12: 1050 pg/mL — ABNORMAL HIGH (ref 211–946)

## 2014-03-29 ENCOUNTER — Other Ambulatory Visit: Payer: Self-pay | Admitting: Internal Medicine

## 2014-04-08 ENCOUNTER — Other Ambulatory Visit: Payer: Self-pay | Admitting: Internal Medicine

## 2014-04-21 ENCOUNTER — Other Ambulatory Visit: Payer: Self-pay | Admitting: Internal Medicine

## 2014-05-30 ENCOUNTER — Encounter: Payer: Self-pay | Admitting: Internal Medicine

## 2014-06-27 ENCOUNTER — Other Ambulatory Visit: Payer: Self-pay | Admitting: Internal Medicine

## 2014-07-11 ENCOUNTER — Ambulatory Visit: Payer: Self-pay | Admitting: Internal Medicine

## 2014-07-13 ENCOUNTER — Ambulatory Visit (INDEPENDENT_AMBULATORY_CARE_PROVIDER_SITE_OTHER): Payer: Medicare Other | Admitting: Nurse Practitioner

## 2014-07-13 ENCOUNTER — Encounter: Payer: Self-pay | Admitting: Nurse Practitioner

## 2014-07-13 VITALS — BP 124/78 | HR 60 | Temp 97.6°F | Resp 16 | Ht 66.0 in | Wt 127.0 lb

## 2014-07-13 DIAGNOSIS — N184 Chronic kidney disease, stage 4 (severe): Secondary | ICD-10-CM | POA: Diagnosis not present

## 2014-07-13 DIAGNOSIS — N182 Chronic kidney disease, stage 2 (mild): Secondary | ICD-10-CM

## 2014-07-13 DIAGNOSIS — R569 Unspecified convulsions: Secondary | ICD-10-CM

## 2014-07-13 DIAGNOSIS — I129 Hypertensive chronic kidney disease with stage 1 through stage 4 chronic kidney disease, or unspecified chronic kidney disease: Secondary | ICD-10-CM

## 2014-07-13 DIAGNOSIS — I10 Essential (primary) hypertension: Secondary | ICD-10-CM | POA: Diagnosis not present

## 2014-07-13 DIAGNOSIS — N183 Chronic kidney disease, stage 3 (moderate): Secondary | ICD-10-CM | POA: Diagnosis not present

## 2014-07-13 DIAGNOSIS — E785 Hyperlipidemia, unspecified: Secondary | ICD-10-CM | POA: Diagnosis not present

## 2014-07-13 DIAGNOSIS — N181 Chronic kidney disease, stage 1: Secondary | ICD-10-CM

## 2014-07-13 DIAGNOSIS — N189 Chronic kidney disease, unspecified: Secondary | ICD-10-CM | POA: Diagnosis not present

## 2014-07-13 DIAGNOSIS — N185 Chronic kidney disease, stage 5: Secondary | ICD-10-CM | POA: Diagnosis not present

## 2014-07-13 NOTE — Progress Notes (Signed)
Patient ID: [name], male   DOB: Jun 08, 1940, 74 y.o.   MRN: [id number]    PCP: [name], NP  Allergies  Allergen Reactions  . Penicillins     Chief Complaint  Patient presents with  . Medical Management of Chronic Issues    4 month follow-up, no recent labs (not fasting today-had coffee with sugar)   . Immunizations    Prevnar 13 today, refuse shingles vaccine     HPI: Patient is a 74 y.o. male seen in the office today for routine follow up. Reports he has been feeling well. Pt with pmh of  COPD, CKD, hyperlipidemia, HTN and seizures.  Reports he conts to smoke, 1/2 pack a day or less. No shortness of breath, cough or congestion Taking medications has prescribed. No side effects noted.  No routine exercise.  Eats what he wants, no diet. Attempts to not eat a lot of fried food.   Advanced Directive information Does patient have an advance directive?: No, Would patient like information on creating an advanced directive?: Yes - Educational materials given Review of Systems:  Review of Systems  Constitutional: Negative for fever, chills, activity change, appetite change and fatigue.  HENT: Negative for congestion and ear pain.   Eyes: Negative for visual disturbance.       Wears corrective lenses  Respiratory: Negative for cough and shortness of breath.   Cardiovascular: Negative for chest pain, palpitations and leg swelling.  Gastrointestinal: Negative for abdominal pain and constipation.  Genitourinary: Negative for dysuria.  Musculoskeletal: Negative for arthralgias.  Neurological: Negative for dizziness, weakness and light-headedness.  Hematological: Negative for adenopathy.  Psychiatric/Behavioral: Negative for behavioral problems, sleep disturbance, self-injury and agitation. The patient is not nervous/anxious.     Past Medical History  Diagnosis Date  . Hypertension   . Hyperchloremia   . Hypercholesteremia   . Aneurysm   . Seizures     has not had  a seizure in 15 yrs  . Hyperpotassemia   . Hypertensive renal disease, benign   . COPD (chronic obstructive pulmonary disease)   . Tobacco use    Past Surgical History  Procedure Laterality Date  . 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  He does not have any smokeless tobacco history on file. He reports that he drinks alcohol. He reports that he does not use illicit drugs.  Family History  Problem Relation Age of Onset  . Cancer Mother   . Congestive Heart Failure Father   . Congestive Heart Failure Sister   . Clotting disorder Sister   . Congestive Heart Failure Brother   . Clotting disorder Sister   . Alzheimer's disease Sister     Medications: Patient's Medications  New Prescriptions   No medications on file  Previous Medications   AMLODIPINE (NORVASC) 10 MG TABLET    TAKE 1 TABLET ONCE A DAY FOR BLOOD PRESSURE   ATORVASTATIN (LIPITOR) 10 MG TABLET    TAKE 1 TABLET (10 MG TOTAL) BY MOUTH DAILY.   DIVALPROEX (DEPAKOTE) 250 MG DR TABLET    TAKE 1 TABLET BY MOUTH 3 TIMES A DAY FOR SEIZURES   LEVETIRACETAM (KEPPRA) 500 MG TABLET    TAKE ONE TABLET BY MOUTH TWO TIMES DAILY  Modified Medications   No medications on file  Discontinued Medications   ATORVASTATIN (LIPITOR) 10 MG TABLET    TAKE 1 TABLET (10 MG TOTAL) BY MOUTH DAILY.  LEVETIRACETAM (KEPPRA) 500 MG TABLET    TAKE ONE TABLET BY MOUTH TWO TIMES DAILY   LEVETIRACETAM (KEPPRA) 500 MG TABLET    TAKE ONE TABLET BY MOUTH TWO TIMES DAILY     Physical Exam:  Filed Vitals:   07/13/14 1102  BP: 124/78  Pulse: 60  Temp: 97.6 F (36.4 C)  TempSrc: Oral  Resp: 16  Height: 5\' 6"  (1.676 m)  Weight: 127 lb (57.607 kg)  SpO2: 97%    Physical Exam  Constitutional: He is oriented to person, place, and time. He appears well-developed. No distress.  HENT:  Head: Normocephalic and atraumatic.  Mouth/Throat: Oropharynx is clear and moist. No oropharyngeal exudate.    Eyes: Conjunctivae are normal. Pupils are equal, round, and reactive to light.  Neck: Normal range of motion. Neck supple.  Cardiovascular: Normal rate, regular rhythm and normal heart sounds.   Pulmonary/Chest: Effort normal and breath sounds normal.  Abdominal: Soft. Bowel sounds are normal.  Musculoskeletal: He exhibits no edema or tenderness.  Lymphadenopathy:    He has no cervical adenopathy.  Neurological: He is alert and oriented to person, place, and time.  Skin: Skin is warm and dry. No rash noted. He is not diaphoretic.  Psychiatric: He has a normal mood and affect. His behavior is normal.    Labs reviewed: Basic Metabolic Panel:  Recent Labs  08/16/13 1226 11/02/13 1135 03/07/14 1449  NA 137 140 144  K 5.2 4.9 5.0  CL 98 101 103  CO2 24 23 23   GLUCOSE 82 86 89  BUN 20 21 34*  CREATININE 1.32* 1.51* 1.60*  CALCIUM 9.4 9.3 9.4  TSH  --   --  1.510   Liver Function Tests:  Recent Labs  08/11/13 0942 11/02/13 1135 03/07/14 1449  AST 29 22 20   ALT 13 12 10   ALKPHOS 72 82 94  BILITOT 0.4 0.3 0.2  PROT 7.2 7.0 7.4   No results for input(s): LIPASE, AMYLASE in the last 8760 hours. No results for input(s): AMMONIA in the last 8760 hours. CBC:  Recent Labs  08/01/13 0921  WBC 4.9  NEUTROABS 1.3*  HGB 13.5  HCT 40.3  MCV 94   Lipid Panel:  Recent Labs  08/01/13 0921  CHOL 277*  HDL 131  LDLCALC 137*  TRIG 45  CHOLHDL 2.1   TSH:  Recent Labs  03/07/14 1449  TSH 1.510   A1C: No results found for: HGBA1C   Assessment/Plan 1. Essential hypertension -stable on norvasc - CBC with Differential/Platelet  2. Seizures -no seizures noted, conts on keppra and depakote - Valproic Acid Level - CBC with Differential/Platelet  3. Hyperlipidemia LDL goal <130 continues on lipitor, does not follow strict diet -encouraged heart healthy diet, will follow up blood work - Lipid panel - Comprehensive metabolic panel - CBC with  Differential/Platelet  4. Hypertensive renal disease, stage 1-4 or unspecified chronic kidney disease Follow up renal function today, avoid NSAID, keep hydrated

## 2014-07-13 NOTE — Patient Instructions (Signed)
Lab work today, follow up in 4 months

## 2014-07-14 LAB — LIPID PANEL
CHOL/HDL RATIO: 1.8 ratio (ref 0.0–5.0)
Cholesterol, Total: 206 mg/dL — ABNORMAL HIGH (ref 100–199)
HDL: 112 mg/dL (ref >39)
LDL CALC: 83 mg/dL (ref 0–99)
Triglycerides: 55 mg/dL (ref 0–149)
VLDL CHOLESTEROL CAL: 11 mg/dL (ref 5–40)

## 2014-07-14 LAB — COMPREHENSIVE METABOLIC PANEL
ALBUMIN: 4.2 g/dL (ref 3.5–4.8)
ALT: 7 IU/L (ref 0–44)
AST: 16 IU/L (ref 0–40)
Albumin/Globulin Ratio: 1.6 (ref 1.1–2.5)
Alkaline Phosphatase: 82 IU/L (ref 39–117)
BUN/Creatinine Ratio: 15 (ref 10–22)
BUN: 19 mg/dL (ref 8–27)
Bilirubin Total: 0.3 mg/dL (ref 0.0–1.2)
CALCIUM: 9 mg/dL (ref 8.6–10.2)
CO2: 23 mmol/L (ref 18–29)
CREATININE: 1.31 mg/dL — AB (ref 0.76–1.27)
Chloride: 103 mmol/L (ref 97–108)
GFR calc Af Amer: 61 mL/min/{1.73_m2} (ref >59)
GFR, EST NON AFRICAN AMERICAN: 53 mL/min/{1.73_m2} — AB (ref >59)
GLOBULIN, TOTAL: 2.6 g/dL (ref 1.5–4.5)
Glucose: 80 mg/dL (ref 65–99)
Potassium: 5 mmol/L (ref 3.5–5.2)
Sodium: 142 mmol/L (ref 134–144)
Total Protein: 6.8 g/dL (ref 6.0–8.5)

## 2014-07-14 LAB — CBC WITH DIFFERENTIAL/PLATELET
Basophils Absolute: 0 10*3/uL (ref 0.0–0.2)
Basos: 0 %
Eos: 1 %
Eosinophils Absolute: 0.1 10*3/uL (ref 0.0–0.4)
HCT: 37.7 % (ref 37.5–51.0)
HEMOGLOBIN: 12.5 g/dL — AB (ref 12.6–17.7)
IMMATURE GRANULOCYTES: 0 %
Immature Grans (Abs): 0 10*3/uL (ref 0.0–0.1)
LYMPHS: 52 %
Lymphocytes Absolute: 2.8 10*3/uL (ref 0.7–3.1)
MCH: 30.9 pg (ref 26.6–33.0)
MCHC: 33.2 g/dL (ref 31.5–35.7)
MCV: 93 fL (ref 79–97)
MONOCYTES: 10 %
Monocytes Absolute: 0.5 10*3/uL (ref 0.1–0.9)
NEUTROS ABS: 1.9 10*3/uL (ref 1.4–7.0)
Neutrophils Relative %: 37 %
PLATELETS: 162 10*3/uL (ref 150–379)
RBC: 4.04 x10E6/uL — ABNORMAL LOW (ref 4.14–5.80)
RDW: 14.5 % (ref 12.3–15.4)
WBC: 5.3 10*3/uL (ref 3.4–10.8)

## 2014-07-14 LAB — VALPROIC ACID LEVEL: Valproic Acid Lvl: 68 ug/mL (ref 50–100)

## 2014-08-04 ENCOUNTER — Other Ambulatory Visit: Payer: Self-pay | Admitting: Internal Medicine

## 2014-08-09 ENCOUNTER — Other Ambulatory Visit: Payer: Self-pay | Admitting: Nurse Practitioner

## 2014-08-26 ENCOUNTER — Other Ambulatory Visit: Payer: Self-pay | Admitting: Internal Medicine

## 2014-11-14 ENCOUNTER — Encounter: Payer: Self-pay | Admitting: Nurse Practitioner

## 2014-11-14 ENCOUNTER — Ambulatory Visit (INDEPENDENT_AMBULATORY_CARE_PROVIDER_SITE_OTHER): Payer: Medicare Other | Admitting: Nurse Practitioner

## 2014-11-14 VITALS — BP 130/80 | HR 66 | Temp 98.2°F | Resp 18 | Ht 66.0 in | Wt 122.0 lb

## 2014-11-14 DIAGNOSIS — N183 Chronic kidney disease, stage 3 (moderate): Secondary | ICD-10-CM

## 2014-11-14 DIAGNOSIS — R569 Unspecified convulsions: Secondary | ICD-10-CM

## 2014-11-14 DIAGNOSIS — R634 Abnormal weight loss: Secondary | ICD-10-CM | POA: Diagnosis not present

## 2014-11-14 DIAGNOSIS — N189 Chronic kidney disease, unspecified: Secondary | ICD-10-CM | POA: Diagnosis not present

## 2014-11-14 DIAGNOSIS — I129 Hypertensive chronic kidney disease with stage 1 through stage 4 chronic kidney disease, or unspecified chronic kidney disease: Secondary | ICD-10-CM

## 2014-11-14 DIAGNOSIS — N182 Chronic kidney disease, stage 2 (mild): Secondary | ICD-10-CM

## 2014-11-14 DIAGNOSIS — N185 Chronic kidney disease, stage 5: Secondary | ICD-10-CM

## 2014-11-14 DIAGNOSIS — E785 Hyperlipidemia, unspecified: Secondary | ICD-10-CM

## 2014-11-14 DIAGNOSIS — I10 Essential (primary) hypertension: Secondary | ICD-10-CM | POA: Diagnosis not present

## 2014-11-14 DIAGNOSIS — N181 Chronic kidney disease, stage 1: Secondary | ICD-10-CM

## 2014-11-14 DIAGNOSIS — N184 Chronic kidney disease, stage 4 (severe): Secondary | ICD-10-CM

## 2014-11-14 DIAGNOSIS — R4189 Other symptoms and signs involving cognitive functions and awareness: Secondary | ICD-10-CM | POA: Diagnosis not present

## 2014-11-14 DIAGNOSIS — D649 Anemia, unspecified: Secondary | ICD-10-CM | POA: Diagnosis not present

## 2014-11-14 NOTE — Progress Notes (Signed)
Patient ID: [name], male   DOB: 02/09/1941, 74 y.o.   MRN: [id number]    PCP: [name], NP  Allergies  Allergen Reactions  . Penicillins     Chief Complaint  Patient presents with  . Medical Management of Chronic Issues     HPI: Patient is a 74 y.o. male seen in the office today for routine follow up. Reports he has been feeling well. Pt with pmh of  COPD, CKD, hyperlipidemia, HTN and seizures.  Has lost weight since last visit, reports he eats good and regular. Cooks breakfast some one else makes dinner.  Has good appetite. Drinking water, 2 30oz a day Still smoking, does not smoke a lot, 3-4 cigarettes daily. No shortness of breath or cough or congestion.   bowels moving good.  No swelling in legs Mood is good, sleeping good at night No seizures    Review of Systems:  Review of Systems  Constitutional: Negative for fever, chills, activity change, appetite change and fatigue.  HENT: Negative for congestion and ear pain.   Eyes: Negative for visual disturbance.       Wears corrective lenses  Respiratory: Negative for cough and shortness of breath.   Cardiovascular: Negative for chest pain, palpitations and leg swelling.  Gastrointestinal: Negative for abdominal pain and constipation.  Genitourinary: Negative for dysuria.  Musculoskeletal: Negative for arthralgias.  Neurological: Negative for dizziness, weakness and light-headedness.  Hematological: Negative for adenopathy.  Psychiatric/Behavioral: Negative for behavioral problems, sleep disturbance, self-injury and agitation. The patient is not nervous/anxious.     Past Medical History  Diagnosis Date  . Hypertension   . Hyperchloremia   . Hypercholesteremia   . Aneurysm   . Seizures     has not had a seizure in 15 yrs  . Hyperpotassemia   . Hypertensive renal disease, benign   . COPD (chronic obstructive pulmonary disease)   . Tobacco use    Past Surgical History  Procedure Laterality Date  .  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  He does not have any smokeless tobacco history on file. He reports that he drinks alcohol. He reports that he does not use illicit drugs.  Family History  Problem Relation Age of Onset  . Cancer Mother   . Congestive Heart Failure Father   . Congestive Heart Failure Sister   . Clotting disorder Sister   . Congestive Heart Failure Brother   . Clotting disorder Sister   . Alzheimer's disease Sister     Medications: Patient's Medications  New Prescriptions   No medications on file  Previous Medications   AMLODIPINE (NORVASC) 10 MG TABLET    TAKE 1 TABLET ONCE A DAY FOR BLOOD PRESSURE   ATORVASTATIN (LIPITOR) 10 MG TABLET    TAKE 1 TABLET (10 MG TOTAL) BY MOUTH DAILY.   DIVALPROEX (DEPAKOTE) 250 MG DR TABLET    TAKE 1 TABLET BY MOUTH 3 TIMES A DAY FOR SEIZURES   LEVETIRACETAM (KEPPRA) 500 MG TABLET    TAKE ONE TABLET BY MOUTH TWO TIMES DAILY  Modified Medications   No medications on file  Discontinued Medications   No medications on file     Physical Exam:  Filed Vitals:   11/14/14 1005  BP: 130/80  Pulse: 66  Temp: 98.2 F (36.8 C)  TempSrc: Oral  Resp: 18  Height: 5\' 6"  (1.676 m)  Weight: 122 lb (55.339 kg)  SpO2:  97%    Physical Exam  Constitutional: He is oriented to person, place, and time. He appears well-developed. No distress.  Thin AA male  HENT:  Head: Normocephalic and atraumatic.  Mouth/Throat: Oropharynx is clear and moist. No oropharyngeal exudate.  Eyes: Conjunctivae are normal. Pupils are equal, round, and reactive to light.  Neck: Neck supple.  Cardiovascular: Normal rate, regular rhythm and normal heart sounds.   Pulmonary/Chest: Effort normal and breath sounds normal.  Abdominal: Soft. Bowel sounds are normal.  Musculoskeletal: Normal range of motion. He exhibits no edema or tenderness.  Neurological: He is alert and oriented to person, place, and  time.  Skin: Skin is warm and dry. He is not diaphoretic.  Psychiatric: He has a normal mood and affect.    Labs reviewed: Basic Metabolic Panel:  Recent Labs  03/07/14 1449 07/13/14 1130  NA 144 142  K 5.0 5.0  CL 103 103  CO2 23 23  GLUCOSE 89 80  BUN 34* 19  CREATININE 1.60* 1.31*  CALCIUM 9.4 9.0  TSH 1.510  --    Liver Function Tests:  Recent Labs  03/07/14 1449 07/13/14 1130  AST 20 16  ALT 10 7  ALKPHOS 94 82  BILITOT 0.2 0.3  PROT 7.4 6.8   No results for input(s): LIPASE, AMYLASE in the last 8760 hours. No results for input(s): AMMONIA in the last 8760 hours. CBC:  Recent Labs  07/13/14 1130  WBC 5.3  NEUTROABS 1.9  HGB 12.5*  HCT 37.7  MCV 93  PLT 162   Lipid Panel:  Recent Labs  07/13/14 1130  CHOL 206*  HDL 112  LDLCALC 83  TRIG 55  CHOLHDL 1.8   TSH:  Recent Labs  03/07/14 1449  TSH 1.510   A1C: No results found for: HGBA1C    Assessment/Plan  1. Essential hypertension -blood pressure stable, conts on norvasc 10 mg daily  - Comprehensive metabolic panel; Future  2. Seizures Stable, no recurrent seizures, conts on depakote TID - Valproic Acid Level; Future  3. Hyperlipidemia LDL goal <130 LDL at goal in April, conts on Lipitor 10 mg daily   4. Hypertensive renal disease, stage 1-4 or unspecified chronic kidney disease Educated to stay hydration, avoiding NSAIDs, proper BP control -will follow up BMP before next appt  5. Cognitive impairment Noted on MMSE in December, memory appears stable. Will follow up MMSE at next visit  6. Anemia, unspecified anemia type -will follow up CBC - CBC with Differential; Future  7. Loss of weight Noted weight loss from last visit  Follow up in 4 months for EV with MMSE and blood work prior to appt

## 2014-11-14 NOTE — Patient Instructions (Signed)
Make sure you are eating 3 meals a day To drink ensure daily for supplement due to weight loss  Follow up in 3 months for physical with fasting blood work prior to visit

## 2014-12-08 ENCOUNTER — Other Ambulatory Visit: Payer: Self-pay | Admitting: Internal Medicine

## 2015-01-24 ENCOUNTER — Other Ambulatory Visit: Payer: Self-pay | Admitting: Nurse Practitioner

## 2015-01-30 ENCOUNTER — Other Ambulatory Visit: Payer: Self-pay | Admitting: Nurse Practitioner

## 2015-02-25 ENCOUNTER — Other Ambulatory Visit: Payer: Self-pay | Admitting: Internal Medicine

## 2015-03-12 ENCOUNTER — Other Ambulatory Visit: Payer: Self-pay | Admitting: *Deleted

## 2015-03-12 MED ORDER — LEVETIRACETAM 500 MG PO TABS: ORAL_TABLET | ORAL | Status: DC

## 2015-03-12 NOTE — Telephone Encounter (Signed)
[place]

## 2015-03-30 ENCOUNTER — Other Ambulatory Visit: Payer: Self-pay | Admitting: Nurse Practitioner

## 2015-04-10 ENCOUNTER — Other Ambulatory Visit: Payer: BC Managed Care – PPO

## 2015-04-11 ENCOUNTER — Other Ambulatory Visit: Payer: Medicare Other

## 2015-04-11 DIAGNOSIS — I10 Essential (primary) hypertension: Secondary | ICD-10-CM

## 2015-04-11 DIAGNOSIS — R569 Unspecified convulsions: Secondary | ICD-10-CM

## 2015-04-11 DIAGNOSIS — D649 Anemia, unspecified: Secondary | ICD-10-CM

## 2015-04-12 ENCOUNTER — Encounter: Payer: Self-pay | Admitting: Nurse Practitioner

## 2015-04-12 ENCOUNTER — Ambulatory Visit (INDEPENDENT_AMBULATORY_CARE_PROVIDER_SITE_OTHER): Payer: Medicare Other | Admitting: Nurse Practitioner

## 2015-04-12 VITALS — BP 120/62 | HR 74 | Temp 98.0°F | Resp 20 | Ht 66.0 in | Wt 125.0 lb

## 2015-04-12 DIAGNOSIS — Z23 Encounter for immunization: Secondary | ICD-10-CM

## 2015-04-12 DIAGNOSIS — Z0000 Encounter for general adult medical examination without abnormal findings: Secondary | ICD-10-CM | POA: Diagnosis not present

## 2015-04-12 DIAGNOSIS — I129 Hypertensive chronic kidney disease with stage 1 through stage 4 chronic kidney disease, or unspecified chronic kidney disease: Secondary | ICD-10-CM

## 2015-04-12 DIAGNOSIS — R569 Unspecified convulsions: Secondary | ICD-10-CM

## 2015-04-12 DIAGNOSIS — I10 Essential (primary) hypertension: Secondary | ICD-10-CM

## 2015-04-12 DIAGNOSIS — E785 Hyperlipidemia, unspecified: Secondary | ICD-10-CM | POA: Diagnosis not present

## 2015-04-12 LAB — CBC WITH DIFFERENTIAL/PLATELET
Basophils Absolute: 0.1 10*3/uL (ref 0.0–0.2)
Basos: 1 %
EOS (ABSOLUTE): 0.1 10*3/uL (ref 0.0–0.4)
EOS: 1 %
HEMATOCRIT: 37.7 % (ref 37.5–51.0)
HEMOGLOBIN: 12.7 g/dL (ref 12.6–17.7)
IMMATURE GRANS (ABS): 0 10*3/uL (ref 0.0–0.1)
Immature Granulocytes: 0 %
Lymphocytes Absolute: 2.6 10*3/uL (ref 0.7–3.1)
Lymphs: 47 %
MCH: 31.8 pg (ref 26.6–33.0)
MCHC: 33.7 g/dL (ref 31.5–35.7)
MCV: 94 fL (ref 79–97)
MONOCYTES: 11 %
Monocytes Absolute: 0.6 10*3/uL (ref 0.1–0.9)
NEUTROS PCT: 40 %
Neutrophils Absolute: 2.2 10*3/uL (ref 1.4–7.0)
Platelets: 166 10*3/uL (ref 150–379)
RBC: 4 x10E6/uL — AB (ref 4.14–5.80)
RDW: 13.9 % (ref 12.3–15.4)
WBC: 5.4 10*3/uL (ref 3.4–10.8)

## 2015-04-12 LAB — COMPREHENSIVE METABOLIC PANEL
ALBUMIN: 4.2 g/dL (ref 3.5–4.8)
ALK PHOS: 76 IU/L (ref 39–117)
ALT: 11 IU/L (ref 0–44)
AST: 19 IU/L (ref 0–40)
Albumin/Globulin Ratio: 1.6 (ref 1.1–2.5)
BUN / CREAT RATIO: 15 (ref 10–22)
BUN: 19 mg/dL (ref 8–27)
Bilirubin Total: 0.3 mg/dL (ref 0.0–1.2)
CALCIUM: 9.2 mg/dL (ref 8.6–10.2)
CO2: 23 mmol/L (ref 18–29)
CREATININE: 1.3 mg/dL — AB (ref 0.76–1.27)
Chloride: 102 mmol/L (ref 96–106)
GFR calc Af Amer: 61 mL/min/{1.73_m2} (ref >59)
GFR, EST NON AFRICAN AMERICAN: 53 mL/min/{1.73_m2} — AB (ref >59)
GLUCOSE: 87 mg/dL (ref 65–99)
Globulin, Total: 2.7 g/dL (ref 1.5–4.5)
Potassium: 5 mmol/L (ref 3.5–5.2)
Sodium: 141 mmol/L (ref 134–144)
Total Protein: 6.9 g/dL (ref 6.0–8.5)

## 2015-04-12 LAB — VALPROIC ACID LEVEL: VALPROIC ACID LVL: 59 ug/mL (ref 50–100)

## 2015-04-12 NOTE — Patient Instructions (Signed)

## 2015-04-12 NOTE — Progress Notes (Signed)
Patient ID: [name], male   DOB: Sep 16, 1940, 75 y.o.   MRN: [id number]    PCP: [name], NP  Advanced Directive information Does patient have an advance directive?: No, Would patient like information on creating an advanced directive?: No - patient declined information  Allergies  Allergen Reactions  . Penicillins     Chief Complaint  Patient presents with  . Annual Exam    MMSE completed,     HPI: Patient is a 75 y.o. male seen in the office today for annual wellness exam. Pt with a hx of htn, seizure, hyperlipidemia, CKD, memory loss, weight loss, anemia.  No hospitalization or major illnesses in the last year.  Weight has been stable.   Screenings: Colon Cancer- colonoscopy at at 67, normal, aged out Prostate Cancer- urination remains stable, no increase urination, or nocturia    Depression screening Depression screen [facility] 2/9 11/14/2014 07/13/2014 03/07/2014 01/19/2014 02/02/2013  Decreased Interest 0 0 0 0 0  Down, Depressed, Hopeless 0 0 0 0 0  PHQ - 2 Score 0 0 0 0 0   Falls Fall Risk  04/12/2015 11/14/2014 07/13/2014 03/07/2014 01/19/2014  Falls in the past year? No No No Yes Yes  Number falls in past yr: - - - 1 1  Injury with Fall? - - - No Yes  Risk for fall due to : - - - History of fall(s) -   MMSE MMSE - Mini Mental State Exam 04/12/2015 03/07/2014  Not completed: (No Data) -  Orientation to time 5 4  Orientation to Place 5 5  Registration 3 3  Attention/ Calculation 4 3  Recall 1 3  Language- name 2 objects 2 2  Language- repeat 1 1  Language- follow 3 step command 3 3  Language- read & follow direction 1 1  Write a sentence 1 1  Copy design 0 1  Total score 26 27   Vaccines Immunization History  Administered Date(s) Administered  . Influenza,inj,Quad PF,36+ Mos 01/19/2014, 04/12/2015  . Pneumococcal Conjugate-13 04/12/2015  . Pneumococcal Polysaccharide-23 02/02/2013  . Td 04/07/2008  flu vaccine today  Smoking status: using  e-cigs Alcohol use: beer up to 4-5 a day to none.   Dentist: does not go, has dentures top and bottom Ophthalmologist: does not go routinely, 2 years ago   Exercise regimen: light jog every now at then.  Diet: liberalized diet due to weight loss  Functional Status of ADLs: independent of all ADLs and iADLs Incontinence- none  Review of Systems:  Review of Systems  Constitutional: Negative for fever, chills, activity change, appetite change and fatigue.  HENT: Negative for congestion and ear pain.   Eyes: Negative for visual disturbance.       Wears corrective lenses  Respiratory: Negative for cough and shortness of breath.   Cardiovascular: Negative for chest pain, palpitations and leg swelling.  Gastrointestinal: Negative for abdominal pain and constipation.  Genitourinary: Negative for dysuria.  Musculoskeletal: Negative for arthralgias.  Neurological: Negative for dizziness, weakness and light-headedness.  Hematological: Negative for adenopathy.  Psychiatric/Behavioral: Negative for behavioral problems, sleep disturbance, self-injury and agitation. The patient is not nervous/anxious.     Past Medical History  Diagnosis Date  . Hypertension   . Hyperchloremia   . Hypercholesteremia   . Aneurysm ([place])   . Seizures ([place])     has not had a seizure in 15 yrs  . Hyperpotassemia   . Hypertensive renal disease, benign   . COPD (chronic obstructive  pulmonary disease) ([place])   . Tobacco use    Past Surgical History  Procedure Laterality Date  . 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 He does not have any smokeless tobacco history on file. He reports that he drinks alcohol. He reports that he does not use illicit drugs.  Family History  Problem Relation Age of Onset  . Cancer Mother   . Congestive Heart Failure Father   . Congestive Heart Failure Sister   . Clotting disorder Sister   . Congestive Heart Failure Brother    . Clotting disorder Sister   . Alzheimer's disease Sister     Medications: Patient's Medications  New Prescriptions   No medications on file  Previous Medications   AMLODIPINE (NORVASC) 10 MG TABLET    TAKE 1 TABLET ONCE A DAY FOR BLOOD PRESSURE   ATORVASTATIN (LIPITOR) 10 MG TABLET    TAKE 1 TABLET (10 MG TOTAL) BY MOUTH DAILY.   DIVALPROEX (DEPAKOTE) 250 MG DR TABLET    TAKE 1 TABLET BY MOUTH 3 TIMES A DAY FOR SEIZURES   LEVETIRACETAM (KEPPRA) 500 MG TABLET    Take one tablet by mouth twice daily  Modified Medications   No medications on file  Discontinued Medications   No medications on file     Physical Exam:  Filed Vitals:   04/12/15 1043  BP: 120/62  Pulse: 74  Temp: 98 F (36.7 C)  TempSrc: Oral  Resp: 20  Height: 5\' 6"  (1.676 m)  Weight: 125 lb (56.7 kg)  SpO2: 96%   Body mass index is 20.19 kg/(m^2).  Physical Exam  Constitutional: He is oriented to person, place, and time. He appears well-developed. No distress.  Thin AA male  HENT:  Head: Normocephalic and atraumatic.  Mouth/Throat: Oropharynx is clear and moist. No oropharyngeal exudate.  Eyes: Conjunctivae are normal. Pupils are equal, round, and reactive to light.  Neck: Neck supple.  Cardiovascular: Normal rate, regular rhythm and normal heart sounds.   Pulmonary/Chest: Effort normal and breath sounds normal.  Abdominal: Soft. Bowel sounds are normal.  Musculoskeletal: Normal range of motion. He exhibits no edema or tenderness.  Neurological: He is alert and oriented to person, place, and time.  Skin: Skin is warm and dry. He is not diaphoretic.  Psychiatric: He has a normal mood and affect.    Labs reviewed: Basic Metabolic Panel:  Recent Labs  07/13/14 1130 04/11/15 0937  NA 142 141  K 5.0 5.0  CL 103 102  CO2 23 23  GLUCOSE 80 87  BUN 19 19  CREATININE 1.31* 1.30*  CALCIUM 9.0 9.2   Liver Function Tests:  Recent Labs  07/13/14 1130 04/11/15 0937  AST 16 19  ALT 7 11   ALKPHOS 82 76  BILITOT 0.3 0.3  PROT 6.8 6.9  ALBUMIN 4.2 4.2   No results for input(s): LIPASE, AMYLASE in the last 8760 hours. No results for input(s): AMMONIA in the last 8760 hours. CBC:  Recent Labs  07/13/14 1130 04/11/15 0937  WBC 5.3 5.4  NEUTROABS 1.9 2.2  HGB 12.5*  --   HCT 37.7 37.7  MCV 93  --   PLT 162  --    Lipid Panel:  Recent Labs  07/13/14 1130  CHOL 206*  HDL 112  LDLCALC 83  TRIG 55  CHOLHDL 1.8   TSH: No results for input(s): TSH in the last 8760 hours. A1C: No results  found for: HGBA1C   Assessment/Plan 1. Medicare annual wellness visit, subsequent The patient is doing well and no distinct problems were identified on exam. -  The patient was counseled regarding the appropriate use of alcohol, prevention of dental and periodontal disease, diet, regular sustained exercise for at least 30 minutes 5 times per week, testicular self-examination on a monthly basis, smoking cessation, tobacco use,  and recommended schedule for GI hemoccult testing, colonoscopy, cholesterol, thyroid and diabetes screening. - Ambulatory referral to Ophthalmology -got flu vaccine and pneumonia vaccine today -cont supplement for nutritional support -MMSE of 26/30 down from 27/30 last year. Pt still able to manage iADLs and ADLs. Will monitor for now but may benefit in medication start in the future.   2. Need for immunization against influenza - Flu Vaccine QUAD 36+ mos PF IM (Fluarix & Fluzone Quad PF)  3. Need for prophylactic vaccination against Streptococcus pneumoniae (pneumococcus) - Pneumococcal conjugate vaccine 13-valent  4. Seizures (HCC) Stable, no seizures noted, conts on depakote, valproic acid level WNL and reviewed with pt.   5. Hyperlipidemia LDL goal <130 -LDL at goal in March, conts on Lipitor 10 mg daily    6. Hypertensive renal disease, stage 1-4 or unspecified chronic kidney disease ([place]) Stable, Labs reviewed with pt, encouraged proper  hydration, avoid NSAIDs   7. Essential hypertension Remains stable, conts on norvasc 10 mg daily   Follow up in 6 months for RV, sooner if needed  Rella Egelston K. Harle Battiest  [facility] [phone] 8 am - 5 pm) [phone] (after hours)

## 2015-04-26 ENCOUNTER — Encounter: Payer: Self-pay | Admitting: Nurse Practitioner

## 2015-05-21 ENCOUNTER — Other Ambulatory Visit: Payer: Self-pay | Admitting: Nurse Practitioner

## 2015-07-23 ENCOUNTER — Other Ambulatory Visit: Payer: Self-pay | Admitting: Nurse Practitioner

## 2015-07-30 ENCOUNTER — Other Ambulatory Visit: Payer: Self-pay | Admitting: *Deleted

## 2015-07-30 MED ORDER — LEVETIRACETAM 500 MG PO TABS: ORAL_TABLET | ORAL | Status: DC

## 2015-07-30 NOTE — Telephone Encounter (Signed)
[facility] 

## 2015-08-15 ENCOUNTER — Other Ambulatory Visit: Payer: Self-pay | Admitting: Nurse Practitioner

## 2015-10-11 ENCOUNTER — Ambulatory Visit (INDEPENDENT_AMBULATORY_CARE_PROVIDER_SITE_OTHER): Payer: Medicare Other | Admitting: Nurse Practitioner

## 2015-10-11 ENCOUNTER — Encounter: Payer: Self-pay | Admitting: Nurse Practitioner

## 2015-10-11 VITALS — BP 122/68 | HR 65 | Temp 97.9°F | Resp 17 | Ht 66.0 in | Wt 122.6 lb

## 2015-10-11 DIAGNOSIS — I129 Hypertensive chronic kidney disease with stage 1 through stage 4 chronic kidney disease, or unspecified chronic kidney disease: Secondary | ICD-10-CM

## 2015-10-11 DIAGNOSIS — F17200 Nicotine dependence, unspecified, uncomplicated: Secondary | ICD-10-CM

## 2015-10-11 DIAGNOSIS — R569 Unspecified convulsions: Secondary | ICD-10-CM

## 2015-10-11 DIAGNOSIS — E785 Hyperlipidemia, unspecified: Secondary | ICD-10-CM | POA: Diagnosis not present

## 2015-10-11 DIAGNOSIS — R634 Abnormal weight loss: Secondary | ICD-10-CM | POA: Diagnosis not present

## 2015-10-11 DIAGNOSIS — I10 Essential (primary) hypertension: Secondary | ICD-10-CM | POA: Diagnosis not present

## 2015-10-11 MED ORDER — ZOSTER VACCINE LIVE 19400 UNT/0.65ML ~~LOC~~ SUSR: 0.6500 mL | Freq: Once | SUBCUTANEOUS | Status: DC

## 2015-10-11 NOTE — Patient Instructions (Signed)
Come back next week for lab draw- please make appt for this.   Follow up in 2 months with Dr Mariea Clonts

## 2015-10-11 NOTE — Progress Notes (Signed)
Patient ID: [name], male   DOB: 09-08-1940, 75 y.o.   MRN: [id number]    PCP: [name], NP  Advanced Directive information Does patient have an advance directive?: No, Would patient like information on creating an advanced directive?: No - patient declined information  Allergies  Allergen Reactions  . Penicillins     Chief Complaint  Patient presents with  . Medical Management of Chronic Issues    6 month medication management follow up. Pt wants to discuss a medication the will help him gain weight. Liquid supplements make him sick to stomach.     HPI: Patient is a 75 y.o. male seen in the office today for routine follow up.  Pt with hx of htn, hyperlipidemia, COPD, hx of tobacco use, renal disease.  Weight at 122 lbs, wants a pill to help him to gain weight but he has a good appetite. Eating 3-4 meals a day.  Eating ribs, mac and cheese, green beans today  Did not get labs before visit. Has not eaten today.  Reports he has quit smoking since last visit.  No cough or shortness of breath.   Lives with sister, gwendolyn cherry who has dementia  Review of Systems:  Review of Systems  Constitutional: Negative for fever, chills, activity change, appetite change and fatigue.  HENT: Negative for congestion and ear pain.   Eyes: Negative for visual disturbance.       Wears corrective lenses  Respiratory: Negative for cough and shortness of breath.   Cardiovascular: Negative for chest pain, palpitations and leg swelling.  Gastrointestinal: Negative for abdominal pain and constipation.  Genitourinary: Negative for dysuria.  Musculoskeletal: Negative for arthralgias.  Neurological: Negative for dizziness, seizures (hx of seizures but none recently), weakness and light-headedness.  Hematological: Negative for adenopathy.  Psychiatric/Behavioral: Negative for behavioral problems, sleep disturbance, self-injury and agitation. The patient is not nervous/anxious.      Past Medical History  Diagnosis Date  . Hypertension   . Hyperchloremia   . Hypercholesteremia   . Aneurysm ([place])   . Seizures ([place])     has not had a seizure in 15 yrs  . Hyperpotassemia   . Hypertensive renal disease, benign   . COPD (chronic obstructive pulmonary disease) ([place])   . Tobacco use    Past Surgical History  Procedure Laterality Date  . 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 He does not have any smokeless tobacco history on file. He reports that he drinks alcohol. He reports that he does not use illicit drugs.  Family History  Problem Relation Age of Onset  . Cancer Mother   . Congestive Heart Failure Father   . Congestive Heart Failure Sister   . Clotting disorder Sister   . Congestive Heart Failure Brother   . Clotting disorder Sister   . Alzheimer's disease Sister     Medications: Patient's Medications  New Prescriptions   No medications on file  Previous Medications   AMLODIPINE (NORVASC) 10 MG TABLET    TAKE 1 TABLET ONCE A DAY FOR BLOOD PRESSURE   ATORVASTATIN (LIPITOR) 10 MG TABLET    TAKE 1 TABLET (10 MG TOTAL) BY MOUTH DAILY.   DIVALPROEX (DEPAKOTE) 250 MG DR TABLET    TAKE 1 TABLET BY MOUTH 3 TIMES A DAY FOR SEIZURES   LEVETIRACETAM (KEPPRA) 500 MG TABLET    Take one tablet by mouth twice daily  Modified Medications  Modified Medication Previous Medication   ZOSTER VACCINE LIVE, PF, (ZOSTAVAX) 82956 UNT/0.65ML INJECTION Zoster Vaccine Live, PF, (ZOSTAVAX) 21308 UNT/0.65ML injection      Inject 19,400 Units into the skin once.    Inject 0.65 mLs into the skin once.  Discontinued Medications   No medications on file     Physical Exam:  Filed Vitals:   10/11/15 1010  BP: 122/68  Pulse: 65  Temp: 97.9 F (36.6 C)  TempSrc: Oral  Resp: 17  Height: 5\' 6"  (1.676 m)  Weight: 122 lb 9.6 oz (55.611 kg)  SpO2: 91%   Body mass index is 19.8 kg/(m^2).  Physical Exam   Constitutional: He is oriented to person, place, and time. He appears well-developed. No distress.  Thin AA male  HENT:  Head: Normocephalic and atraumatic.  Mouth/Throat: Oropharynx is clear and moist. No oropharyngeal exudate.  Eyes: Conjunctivae are normal. Pupils are equal, round, and reactive to light.  Neck: Neck supple.  Cardiovascular: Normal rate, regular rhythm and normal heart sounds.   Pulmonary/Chest: Effort normal and breath sounds normal.  Abdominal: Soft. Bowel sounds are normal.  Musculoskeletal: Normal range of motion. He exhibits no edema or tenderness.  Neurological: He is alert and oriented to person, place, and time.  Skin: Skin is warm and dry. He is not diaphoretic.  Psychiatric: He has a normal mood and affect.    Labs reviewed: Basic Metabolic Panel:  Recent Labs  04/11/15 0937  NA 141  K 5.0  CL 102  CO2 23  GLUCOSE 87  BUN 19  CREATININE 1.30*  CALCIUM 9.2   Liver Function Tests:  Recent Labs  04/11/15 0937  AST 19  ALT 11  ALKPHOS 76  BILITOT 0.3  PROT 6.9  ALBUMIN 4.2   No results for input(s): LIPASE, AMYLASE in the last 8760 hours. No results for input(s): AMMONIA in the last 8760 hours. CBC:  Recent Labs  04/11/15 0937  WBC 5.4  NEUTROABS 2.2  HCT 37.7  MCV 94  PLT 166   Lipid Panel: No results for input(s): CHOL, HDL, LDLCALC, TRIG, CHOLHDL, LDLDIRECT in the last 8760 hours. TSH: No results for input(s): TSH in the last 8760 hours. A1C: No results found for: HGBA1C   Assessment/Plan 1. Essential hypertension, benign -blood pressure stable, conts on norvasc 10 mg daily   2. Hypertensive renal disease, stage 1-4 or unspecified chronic kidney disease ([place]) Blood pressure stable, will follow up renal function at this time. To avoid renal toxic medications   3. Seizures ([place]) -no recent seizures noted. -conts on depakote and keppra will follow up depakote level.   4. Tobacco use disorder Has quit smoking  5.  Hyperlipidemia LDL goal <130 -will follow up fasting lipids, cont on lipitor 10 mg daily  6. Weight loss  -weight has fluctuated in the last year. Pt reports good appetite with good PO intake. Will follow up CMP to evaluate albumin and protein    Raelynn Corron K. Harle Battiest  [facility] [phone] 8 am - 5 pm) [phone] (after hours)

## 2015-10-15 ENCOUNTER — Other Ambulatory Visit: Payer: Self-pay | Admitting: Nurse Practitioner

## 2015-10-15 ENCOUNTER — Other Ambulatory Visit: Payer: Medicare Other

## 2015-10-15 DIAGNOSIS — D649 Anemia, unspecified: Secondary | ICD-10-CM

## 2015-10-15 DIAGNOSIS — R569 Unspecified convulsions: Secondary | ICD-10-CM

## 2015-10-15 DIAGNOSIS — E785 Hyperlipidemia, unspecified: Secondary | ICD-10-CM

## 2015-10-15 DIAGNOSIS — I10 Essential (primary) hypertension: Secondary | ICD-10-CM

## 2015-10-15 LAB — LIPID PANEL
CHOL/HDL RATIO: 2 ratio (ref <=5.0)
Cholesterol: 217 mg/dL — ABNORMAL HIGH (ref 125–200)
HDL: 107 mg/dL (ref >=40)
LDL CALC: 97 mg/dL (ref <130)
TRIGLYCERIDES: 64 mg/dL (ref <150)
VLDL: 13 mg/dL (ref <30)

## 2015-10-15 LAB — CBC WITH DIFFERENTIAL/PLATELET
BASOS ABS: 0 {cells}/uL (ref 0–200)
Basophils Relative: 0 %
EOS ABS: 51 {cells}/uL (ref 15–500)
Eosinophils Relative: 1 %
HCT: 37.3 % — ABNORMAL LOW (ref 38.5–50.0)
Hemoglobin: 12.5 g/dL — ABNORMAL LOW (ref 13.2–17.1)
LYMPHS PCT: 43 %
Lymphs Abs: 2193 cells/uL (ref 850–3900)
MCH: 31.3 pg (ref 27.0–33.0)
MCHC: 33.5 g/dL (ref 32.0–36.0)
MCV: 93.3 fL (ref 80.0–100.0)
MONOS PCT: 13 %
MPV: 10.8 fL (ref 7.5–12.5)
Monocytes Absolute: 663 cells/uL (ref 200–950)
Neutro Abs: 2193 cells/uL (ref 1500–7800)
Neutrophils Relative %: 43 %
PLATELETS: 143 10*3/uL (ref 140–400)
RBC: 4 MIL/uL — ABNORMAL LOW (ref 4.20–5.80)
RDW: 14.2 % (ref 11.0–15.0)
WBC: 5.1 10*3/uL (ref 3.8–10.8)

## 2015-10-15 LAB — COMPLETE METABOLIC PANEL WITH GFR
ALT: 9 U/L (ref 9–46)
AST: 19 U/L (ref 10–35)
Albumin: 4 g/dL (ref 3.6–5.1)
Alkaline Phosphatase: 66 U/L (ref 40–115)
BILIRUBIN TOTAL: 0.4 mg/dL (ref 0.2–1.2)
BUN: 21 mg/dL (ref 7–25)
CO2: 25 mmol/L (ref 20–31)
CREATININE: 1.18 mg/dL (ref 0.70–1.18)
Calcium: 9.1 mg/dL (ref 8.6–10.3)
Chloride: 95 mmol/L — ABNORMAL LOW (ref 98–110)
GFR, EST AFRICAN AMERICAN: 68 mL/min (ref >=60)
GFR, Est Non African American: 59 mL/min — ABNORMAL LOW (ref >=60)
GLUCOSE: 84 mg/dL (ref 65–99)
Potassium: 5.8 mmol/L — ABNORMAL HIGH (ref 3.5–5.3)
Sodium: 129 mmol/L — ABNORMAL LOW (ref 135–146)
TOTAL PROTEIN: 6.6 g/dL (ref 6.1–8.1)

## 2015-10-16 LAB — VALPROIC ACID LEVEL: Valproic Acid Lvl: 75.6 ug/mL (ref 50.0–100.0)

## 2015-10-18 ENCOUNTER — Other Ambulatory Visit: Payer: Self-pay

## 2015-10-18 DIAGNOSIS — E875 Hyperkalemia: Secondary | ICD-10-CM

## 2015-10-24 ENCOUNTER — Other Ambulatory Visit: Payer: Self-pay

## 2015-10-25 ENCOUNTER — Other Ambulatory Visit: Payer: Medicare Other

## 2015-10-25 DIAGNOSIS — E875 Hyperkalemia: Secondary | ICD-10-CM

## 2015-10-25 LAB — BASIC METABOLIC PANEL WITH GFR
BUN: 18 mg/dL (ref 7–25)
CHLORIDE: 99 mmol/L (ref 98–110)
CO2: 25 mmol/L (ref 20–31)
CREATININE: 1.41 mg/dL — AB (ref 0.70–1.18)
Calcium: 8.8 mg/dL (ref 8.6–10.3)
GFR, Est African American: 55 mL/min — ABNORMAL LOW (ref >=60)
GFR, Est Non African American: 48 mL/min — ABNORMAL LOW (ref >=60)
GLUCOSE: 88 mg/dL (ref 65–99)
POTASSIUM: 5.2 mmol/L (ref 3.5–5.3)
Sodium: 133 mmol/L — ABNORMAL LOW (ref 135–146)

## 2015-10-29 ENCOUNTER — Other Ambulatory Visit: Payer: Self-pay

## 2015-10-29 DIAGNOSIS — E871 Hypo-osmolality and hyponatremia: Secondary | ICD-10-CM

## 2015-12-02 ENCOUNTER — Other Ambulatory Visit: Payer: Self-pay | Admitting: Nurse Practitioner

## 2015-12-11 ENCOUNTER — Other Ambulatory Visit: Payer: Self-pay

## 2015-12-11 ENCOUNTER — Telehealth: Payer: Self-pay

## 2015-12-11 NOTE — Telephone Encounter (Signed)
I called patient to ask why he did not come to his lab appointment today. He stated that he thought it was scheduled for 12/13/15. I informed patient that on 12/13/15, he is scheduled for an office visit. Patient stated that he would like to come in tomorrow for the lab work he needs. His appointment was changed to tomorrow morning.

## 2015-12-12 ENCOUNTER — Other Ambulatory Visit: Payer: Self-pay

## 2015-12-13 ENCOUNTER — Ambulatory Visit (INDEPENDENT_AMBULATORY_CARE_PROVIDER_SITE_OTHER): Payer: Medicare Other | Admitting: Internal Medicine

## 2015-12-13 ENCOUNTER — Encounter: Payer: Self-pay | Admitting: Internal Medicine

## 2015-12-13 VITALS — BP 150/70 | HR 68 | Temp 98.1°F | Wt 125.0 lb

## 2015-12-13 DIAGNOSIS — E871 Hypo-osmolality and hyponatremia: Secondary | ICD-10-CM | POA: Diagnosis not present

## 2015-12-13 DIAGNOSIS — I129 Hypertensive chronic kidney disease with stage 1 through stage 4 chronic kidney disease, or unspecified chronic kidney disease: Secondary | ICD-10-CM

## 2015-12-13 DIAGNOSIS — E875 Hyperkalemia: Secondary | ICD-10-CM

## 2015-12-13 DIAGNOSIS — R569 Unspecified convulsions: Secondary | ICD-10-CM

## 2015-12-13 DIAGNOSIS — R634 Abnormal weight loss: Secondary | ICD-10-CM

## 2015-12-13 DIAGNOSIS — Z23 Encounter for immunization: Secondary | ICD-10-CM

## 2015-12-13 LAB — BASIC METABOLIC PANEL WITH GFR
BUN: 18 mg/dL (ref 7–25)
CALCIUM: 9.3 mg/dL (ref 8.6–10.3)
CO2: 25 mmol/L (ref 20–31)
CREATININE: 1.1 mg/dL (ref 0.70–1.18)
Chloride: 101 mmol/L (ref 98–110)
GFR, EST NON AFRICAN AMERICAN: 64 mL/min (ref >=60)
GFR, Est African American: 74 mL/min (ref >=60)
Glucose, Bld: 86 mg/dL (ref 65–99)
Potassium: 4.6 mmol/L (ref 3.5–5.3)
Sodium: 132 mmol/L — ABNORMAL LOW (ref 135–146)

## 2015-12-13 NOTE — Progress Notes (Signed)
Location:  [facility] clinic Provider:  Shalice Woodring L. Mariea Clonts, D.O., C.M.D. PCP:  [name], NP  Code Status: full code Goals of Care:  Advanced Directives 12/13/2015  Does patient have an advance directive? No  Would patient like information on creating an advanced directive? No - patient declined information   Chief Complaint  Patient presents with  . Medical Management of Chronic Issues    2 mth follow-up    HPI: Patient is a 75 y.o. male with h/o HTN, HL, COPD, tobacco use, renal disease seen today for medical management of chronic diseases.    [name] had asked him to come see me due to his abnormal sodium and potassium levels.  He was supposed to come get labs yesterday so I'd have the results to review today, but he did not so labs were drawn just before his visit.   Says he's been doing alright.  He's taking his medication and eating regularly.   Is drinking a special chocolate drink that his friend gave him that is going to help him gain weight.  The other drinks go through him.  Has no trouble with milk products.    Has mild anemia. In July, his potassium was high and sodium.  Na was 129.   Weight loss is new also.   He is up 2 lbs.   He no longer smokes.    Past Medical History:  Diagnosis Date  . Aneurysm ([place])   . COPD (chronic obstructive pulmonary disease) ([place])   . Hyperchloremia   . Hypercholesteremia   . Hyperpotassemia   . Hypertension   . Hypertensive renal disease, benign   . Seizures ([place])    has not had a seizure in 15 yrs  . Tobacco use     Past Surgical History:  Procedure Laterality Date  . 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, 1996    Allergies  Allergen Reactions  . Penicillins       Medication List       Accurate as of 12/13/15  3:55 PM. Always use your most recent med list.          amLODipine 10 MG tablet Commonly known as:  NORVASC TAKE 1 TABLET ONCE A DAY FOR BLOOD PRESSURE   atorvastatin 10 MG tablet Commonly known as:   LIPITOR TAKE 1 TABLET (10 MG TOTAL) BY MOUTH DAILY.   divalproex 250 MG DR tablet Commonly known as:  DEPAKOTE TAKE 1 TABLET BY MOUTH 3 TIMES A DAY FOR SEIZURES   levETIRAcetam 500 MG tablet Commonly known as:  KEPPRA Take one tablet by mouth twice daily       Review of Systems:  Review of Systems  Constitutional: Positive for weight loss. Negative for chills, fever and malaise/fatigue.  Eyes:       Glasses  Respiratory: Negative for shortness of breath.   Cardiovascular: Negative for chest pain and palpitations.  Gastrointestinal: Negative for abdominal pain.  Genitourinary: Positive for frequency. Negative for dysuria.  Musculoskeletal: Negative for falls.  Neurological: Negative for dizziness and weakness.  Psychiatric/Behavioral: Negative for memory loss.    Health Maintenance  Topic Date Due  . ZOSTAVAX  09/02/1998  . INFLUENZA VACCINE  11/06/2015  . TETANUS/TDAP  04/07/2018  . PNA vac Low Risk Adult  Completed    Physical Exam: Vitals:   12/13/15 1539  BP: (!) 150/70  Pulse: 68  Temp: 98.1 F (36.7 C)  TempSrc: Oral  SpO2: 95%  Weight: 125 lb (56.7 kg)  Body mass index is 20.18 kg/m. Physical Exam  Constitutional: He is oriented to person, place, and time. He appears well-developed. No distress.  Cachectic appearing  Cardiovascular: Normal rate, regular rhythm, normal heart sounds and intact distal pulses.   Pulmonary/Chest: Effort normal and breath sounds normal. No respiratory distress.  Abdominal: Soft. Bowel sounds are normal.  Musculoskeletal: Normal range of motion.  Neurological: He is alert and oriented to person, place, and time.  Skin: Skin is warm.  Psychiatric: He has a normal mood and affect.    Labs reviewed: Basic Metabolic Panel:  Recent Labs  04/11/15 0937 10/15/15 1016 10/25/15 0845  NA 141 129* 133*  K 5.0 5.8* 5.2  CL 102 95* 99  CO2 '23 25 25  ' GLUCOSE 87 84 88  BUN '19 21 18  ' CREATININE 1.30* 1.18 1.41*  CALCIUM 9.2  9.1 8.8   Liver Function Tests:  Recent Labs  04/11/15 0937 10/15/15 1016  AST 19 19  ALT 11 9  ALKPHOS 76 66  BILITOT 0.3 0.4  PROT 6.9 6.6  ALBUMIN 4.2 4.0   No results for input(s): LIPASE, AMYLASE in the last 8760 hours. No results for input(s): AMMONIA in the last 8760 hours. CBC:  Recent Labs  04/11/15 0937 10/15/15 1208  WBC 5.4 5.1  NEUTROABS 2.2 2,193  HGB  --  12.5*  HCT 37.7 37.3*  MCV 94 93.3  PLT 166 143   Lipid Panel:  Recent Labs  10/15/15 1016  CHOL 217*  HDL 107  LDLCALC 97  TRIG 64  CHOLHDL 2.0   No results found for: HGBA1C  Procedures since last visit: No results found.  Assessment/Plan 1. Hyponatremia - cause not clear -? Adrenal issue or related to his renal failure - BMP with eGFR  2. Hyperkalemia -f/u potassium today -likely renal related  3. Hypertensive renal disease, stage 1-4 or unspecified chronic kidney disease ([place]) -bp elevated today, needs further workup of low Na and high potassium  4. Seizures ([place]) -continues on depakote and keppra--levels wnl  5. Loss of weight -ongoing problem--did gain two lbs today -is taking some sort of chocolate drink as a supplement for weight gain--unclear what  -does not tolerate ensure--causes diarrhea  6. Need for prophylactic vaccination and inoculation against influenza -flu shot given  Labs/tests ordered:  To be determined when next labs come back Next appt:  6 wks with [name]  Velecia Ovitt L. Annalicia Renfrew, D.O. [facility] [address], [place] [address] Cell Phone (Mon-Fri 8am-5pm):  [phone] On Call:  [phone] & follow prompts after 5pm & weekends Office Phone:  [phone] Office Fax:  [phone]

## 2015-12-17 ENCOUNTER — Other Ambulatory Visit: Payer: Self-pay | Admitting: *Deleted

## 2015-12-17 MED ORDER — ATORVASTATIN CALCIUM 10 MG PO TABS: ORAL_TABLET | ORAL | 1 refills | Status: DC

## 2015-12-17 MED ORDER — AMLODIPINE BESYLATE 10 MG PO TABS: ORAL_TABLET | ORAL | 1 refills | Status: DC

## 2015-12-17 NOTE — Telephone Encounter (Signed)
[facility] 

## 2015-12-24 ENCOUNTER — Other Ambulatory Visit: Payer: Self-pay | Admitting: *Deleted

## 2015-12-24 MED ORDER — ATORVASTATIN CALCIUM 10 MG PO TABS: ORAL_TABLET | ORAL | 1 refills | Status: DC

## 2015-12-24 MED ORDER — AMLODIPINE BESYLATE 10 MG PO TABS: ORAL_TABLET | ORAL | 1 refills | Status: DC

## 2015-12-24 MED ORDER — LEVETIRACETAM 500 MG PO TABS: ORAL_TABLET | ORAL | 1 refills | Status: DC

## 2015-12-24 NOTE — Telephone Encounter (Signed)
[facility] 

## 2016-01-24 ENCOUNTER — Encounter: Payer: Self-pay | Admitting: Nurse Practitioner

## 2016-01-24 ENCOUNTER — Ambulatory Visit (INDEPENDENT_AMBULATORY_CARE_PROVIDER_SITE_OTHER): Payer: Medicare Other | Admitting: Nurse Practitioner

## 2016-01-24 VITALS — BP 144/78 | HR 66 | Temp 97.7°F | Resp 18 | Ht 66.0 in | Wt 123.6 lb

## 2016-01-24 DIAGNOSIS — N189 Chronic kidney disease, unspecified: Secondary | ICD-10-CM | POA: Diagnosis not present

## 2016-01-24 DIAGNOSIS — R634 Abnormal weight loss: Secondary | ICD-10-CM | POA: Diagnosis not present

## 2016-01-24 DIAGNOSIS — E871 Hypo-osmolality and hyponatremia: Secondary | ICD-10-CM | POA: Diagnosis not present

## 2016-01-24 DIAGNOSIS — D631 Anemia in chronic kidney disease: Secondary | ICD-10-CM

## 2016-01-24 DIAGNOSIS — I129 Hypertensive chronic kidney disease with stage 1 through stage 4 chronic kidney disease, or unspecified chronic kidney disease: Secondary | ICD-10-CM

## 2016-01-24 LAB — CBC WITH DIFFERENTIAL/PLATELET
BASOS PCT: 0 %
Basophils Absolute: 0 cells/uL (ref 0–200)
EOS ABS: 45 {cells}/uL (ref 15–500)
Eosinophils Relative: 1 %
HEMATOCRIT: 37.4 % — AB (ref 38.5–50.0)
HEMOGLOBIN: 12.7 g/dL — AB (ref 13.2–17.1)
LYMPHS ABS: 2025 {cells}/uL (ref 850–3900)
LYMPHS PCT: 45 %
MCH: 31.8 pg (ref 27.0–33.0)
MCHC: 34 g/dL (ref 32.0–36.0)
MCV: 93.7 fL (ref 80.0–100.0)
MONO ABS: 630 {cells}/uL (ref 200–950)
MPV: 10.8 fL (ref 7.5–12.5)
Monocytes Relative: 14 %
NEUTROS PCT: 40 %
Neutro Abs: 1800 cells/uL (ref 1500–7800)
Platelets: 172 10*3/uL (ref 140–400)
RBC: 3.99 MIL/uL — AB (ref 4.20–5.80)
RDW: 14.3 % (ref 11.0–15.0)
WBC: 4.5 10*3/uL (ref 3.8–10.8)

## 2016-01-24 LAB — COMPLETE METABOLIC PANEL WITH GFR
ALBUMIN: 4 g/dL (ref 3.6–5.1)
ALK PHOS: 79 U/L (ref 40–115)
ALT: 8 U/L — AB (ref 9–46)
AST: 18 U/L (ref 10–35)
BILIRUBIN TOTAL: 0.3 mg/dL (ref 0.2–1.2)
BUN: 20 mg/dL (ref 7–25)
CALCIUM: 9.4 mg/dL (ref 8.6–10.3)
CO2: 25 mmol/L (ref 20–31)
CREATININE: 1.38 mg/dL — AB (ref 0.70–1.18)
Chloride: 104 mmol/L (ref 98–110)
GFR, EST AFRICAN AMERICAN: 57 mL/min — AB (ref >=60)
GFR, Est Non African American: 49 mL/min — ABNORMAL LOW (ref >=60)
Glucose, Bld: 74 mg/dL (ref 65–99)
Potassium: 4.5 mmol/L (ref 3.5–5.3)
Sodium: 140 mmol/L (ref 135–146)
Total Protein: 6.8 g/dL (ref 6.1–8.1)

## 2016-01-24 NOTE — Patient Instructions (Signed)
Will follow up blood work and urine today  DASH Eating Plan DASH stands for "Dietary Approaches to Stop Hypertension." The DASH eating plan is a healthy eating plan that has been shown to reduce high blood pressure (hypertension). Additional health benefits may include reducing the risk of type 2 diabetes mellitus, heart disease, and stroke. The DASH eating plan may also help with weight loss. WHAT DO I NEED TO KNOW ABOUT THE DASH EATING PLAN? For the DASH eating plan, you will follow these general guidelines:  Choose foods with a percent daily value for sodium of less than 5% (as listed on the food label).  Use salt-free seasonings or herbs instead of table salt or sea salt.  Check with your health care provider or pharmacist before using salt substitutes.  Eat lower-sodium products, often labeled as "lower sodium" or "no salt added."  Eat fresh foods.  Eat more vegetables, fruits, and low-fat dairy products.  Choose whole grains. Look for the word "whole" as the first word in the ingredient list.  Choose fish and skinless chicken or [place] more often than red meat. Limit fish, poultry, and meat to 6 oz (170 g) each day.  Limit sweets, desserts, sugars, and sugary drinks.  Choose heart-healthy fats.  Limit cheese to 1 oz (28 g) per day.  Eat more home-cooked food and less restaurant, buffet, and fast food.  Limit fried foods.  Cook foods using methods other than frying.  Limit canned vegetables. If you do use them, rinse them well to decrease the sodium.  When eating at a restaurant, ask that your food be prepared with less salt, or no salt if possible. WHAT FOODS CAN I EAT? Seek help from a dietitian for individual calorie needs. Grains Whole grain or whole wheat bread. Brown rice. Whole grain or whole wheat pasta. Quinoa, bulgur, and whole grain cereals. Low-sodium cereals. Corn or whole wheat flour tortillas. Whole grain cornbread. Whole grain crackers. Low-sodium  crackers. Vegetables Fresh or frozen vegetables (raw, steamed, roasted, or grilled). Low-sodium or reduced-sodium tomato and vegetable juices. Low-sodium or reduced-sodium tomato sauce and paste. Low-sodium or reduced-sodium canned vegetables.  Fruits All fresh, canned (in natural juice), or frozen fruits. Meat and Other Protein Products Ground beef (85% or leaner), grass-fed beef, or beef trimmed of fat. Skinless chicken or [place]. Ground chicken or [place]. Pork trimmed of fat. All fish and seafood. Eggs. Dried beans, peas, or lentils. Unsalted nuts and seeds. Unsalted canned beans. Dairy Low-fat dairy products, such as skim or 1% milk, 2% or reduced-fat cheeses, low-fat ricotta or cottage cheese, or plain low-fat yogurt. Low-sodium or reduced-sodium cheeses. Fats and Oils Tub margarines without trans fats. Light or reduced-fat mayonnaise and salad dressings (reduced sodium). Avocado. Safflower, olive, or canola oils. Natural peanut or almond butter. Other Unsalted popcorn and pretzels. The items listed above may not be a complete list of recommended foods or beverages. Contact your dietitian for more options. WHAT FOODS ARE NOT RECOMMENDED? Grains White bread. White pasta. White rice. Refined cornbread. Bagels and croissants. Crackers that contain trans fat. Vegetables Creamed or fried vegetables. Vegetables in a cheese sauce. Regular canned vegetables. Regular canned tomato sauce and paste. Regular tomato and vegetable juices. Fruits Dried fruits. Canned fruit in light or heavy syrup. Fruit juice. Meat and Other Protein Products Fatty cuts of meat. Ribs, chicken wings, bacon, sausage, bologna, salami, chitterlings, fatback, hot dogs, bratwurst, and packaged luncheon meats. Salted nuts and seeds. Canned beans with salt. Dairy Whole or 2% milk, cream,  half-and-half, and cream cheese. Whole-fat or sweetened yogurt. Full-fat cheeses or blue cheese. Nondairy creamers and whipped toppings.  Processed cheese, cheese spreads, or cheese curds. Condiments Onion and garlic salt, seasoned salt, table salt, and sea salt. Canned and packaged gravies. Worcestershire sauce. Tartar sauce. Barbecue sauce. Teriyaki sauce. Soy sauce, including reduced sodium. Steak sauce. Fish sauce. Oyster sauce. Cocktail sauce. Horseradish. Ketchup and mustard. Meat flavorings and tenderizers. Bouillon cubes. Hot sauce. Tabasco sauce. Marinades. Taco seasonings. Relishes. Fats and Oils Butter, stick margarine, lard, shortening, ghee, and bacon fat. Coconut, palm kernel, or palm oils. Regular salad dressings. Other Pickles and olives. Salted popcorn and pretzels. The items listed above may not be a complete list of foods and beverages to avoid. Contact your dietitian for more information. WHERE CAN I FIND MORE INFORMATION? National Heart, Lung, and Blood Institute: travelstabloid.com   This information is not intended to replace advice given to you by your health care provider. Make sure you discuss any questions you have with your health care provider.   Document Released: 03/13/2011 Document Revised: 04/14/2014 Document Reviewed: 01/26/2013 Elsevier Interactive Patient Education [employer].

## 2016-01-24 NOTE — Progress Notes (Signed)
Careteam: Patient Care Team: [name], NP as PCP - General (Nurse Practitioner)  Advanced Directive information Does patient have an advance directive?: Yes, Type of Advance Directive: Healthcare Power of [place];Living will  Allergies  Allergen Reactions  . Penicillins     Chief Complaint  Patient presents with  . Medical Management of Chronic Issues    6 week follow up for NA & K.      HPI: Patient is a 75 y.o. male seen in the office today to follow up. Pt with hx of hyponatremia, htn, seizures, renal disease.   Appetite great, weight has been between 122-125 lbs in the last year. Eats a lot of fried food.   Occasionally will have a cigarette No shortness of breath or chest pains, palpitations   Drinking 4 12 oz bottles of water a day Using the bathroom around 4 times a day Does not use the bathroom a lot during the night, maybe once if he drinks a coors light.    Review of Systems:  Review of Systems  Constitutional: Negative for activity change, appetite change, chills, fatigue and fever.  HENT: Negative for congestion and ear pain.   Eyes: Negative for visual disturbance.       Wears corrective lenses  Respiratory: Negative for cough and shortness of breath.   Cardiovascular: Negative for chest pain, palpitations and leg swelling.  Gastrointestinal: Negative for abdominal pain and constipation.  Genitourinary: Negative for dysuria.  Musculoskeletal: Negative for arthralgias.  Neurological: Negative for dizziness, seizures (hx of seizures but none recently), weakness and light-headedness.  Hematological: Negative for adenopathy.  Psychiatric/Behavioral: Negative for agitation, behavioral problems, self-injury and sleep disturbance. The patient is not nervous/anxious.     Past Medical History:  Diagnosis Date  . Aneurysm ([place])   . COPD (chronic obstructive pulmonary disease) ([place])   . Hyperchloremia   . Hypercholesteremia   . Hyperpotassemia     . Hypertension   . Hypertensive renal disease, benign   . Seizures ([place])    has not had a seizure in 15 yrs  . Tobacco use    Past Surgical History:  Procedure Laterality Date  . 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, 1996   Social History:   reports that he has quit smoking. He quit after 56.00 years of use. He has never used smokeless tobacco. He reports that he drinks alcohol. He reports that he does not use drugs.  Family History  Problem Relation Age of Onset  . Cancer Mother   . Congestive Heart Failure Father   . Alzheimer's disease Sister   . Congestive Heart Failure Sister   . Clotting disorder Sister   . Congestive Heart Failure Brother   . Clotting disorder Sister     Medications: Patient's Medications  New Prescriptions   No medications on file  Previous Medications   AMLODIPINE (NORVASC) 10 MG TABLET    TAKE 1 TABLET ONCE A DAY FOR BLOOD PRESSURE   ATORVASTATIN (LIPITOR) 10 MG TABLET    TAKE 1 TABLET (10 MG TOTAL) BY MOUTH DAILY.   DIVALPROEX (DEPAKOTE) 250 MG DR TABLET    TAKE 1 TABLET BY MOUTH 3 TIMES A DAY FOR SEIZURES   LEVETIRACETAM (KEPPRA) 500 MG TABLET    Take one tablet by mouth twice daily  Modified Medications   No medications on file  Discontinued Medications   No medications on file     Physical Exam:  Vitals:   01/24/16 1023  BP: Marland Kitchen)  144/78  Pulse: 66  Resp: 18  Temp: 97.7 F (36.5 C)  TempSrc: Oral  SpO2: 99%  Weight: 123 lb 10.1 oz (56.1 kg)  Height: '5\' 6"'  (1.676 m)   Body mass index is 19.95 kg/m.  Physical Exam  Constitutional: He is oriented to person, place, and time. He appears well-developed. No distress.  thin appearing  HENT:  Mouth/Throat: Oropharynx is clear and moist. No oropharyngeal exudate.  Neck: Normal range of motion.  Cardiovascular: Normal rate, regular rhythm, normal heart sounds and intact distal pulses.   Pulmonary/Chest: Effort normal and breath sounds normal. No respiratory distress.  Abdominal: Soft.  Bowel sounds are normal.  Musculoskeletal: Normal range of motion.  Neurological: He is alert and oriented to person, place, and time.  Skin: Skin is warm.  Psychiatric: He has a normal mood and affect.    Labs reviewed: Basic Metabolic Panel:  Recent Labs  10/15/15 1016 10/25/15 0845 12/13/15 1550  NA 129* 133* 132*  K 5.8* 5.2 4.6  CL 95* 99 101  CO2 '25 25 25  ' GLUCOSE 84 88 86  BUN '21 18 18  ' CREATININE 1.18 1.41* 1.10  CALCIUM 9.1 8.8 9.3   Liver Function Tests:  Recent Labs  04/11/15 0937 10/15/15 1016  AST 19 19  ALT 11 9  ALKPHOS 76 66  BILITOT 0.3 0.4  PROT 6.9 6.6  ALBUMIN 4.2 4.0   No results for input(s): LIPASE, AMYLASE in the last 8760 hours. No results for input(s): AMMONIA in the last 8760 hours. CBC:  Recent Labs  04/11/15 0937 10/15/15 1208  WBC 5.4 5.1  NEUTROABS 2.2 2,193  HGB  --  12.5*  HCT 37.7 37.3*  MCV 94 93.3  PLT 166 143   Lipid Panel:  Recent Labs  10/15/15 1016  CHOL 217*  HDL 107  LDLCALC 97  TRIG 64  CHOLHDL 2.0   TSH: No results for input(s): TSH in the last 8760 hours. A1C: No results found for: HGBA1C   Assessment/Plan 1. Hyponatremia - CMP with eGFR - Osmolality - Osmolality, urine - Sodium, urine, random  2. Hypertensive renal disease, stage 1-4 or unspecified chronic kidney disease -repeat blood pressure 138/72 - CBC with Differential/Platelets - CMP with eGFR  3. Loss of weight Weight has been stable over the last year, cont supplements and nutrition - CBC with Differential/Platelets - CMP with eGFR  4. Anemia in chronic kidney disease, unspecified CKD stage Will follow up CBC at this time.   Follow up in 3 months, sooner if needed  Lekisha Mcghee K. Harle Battiest  [facility] [phone] 8 am - 5 pm) [phone] (after hours)

## 2016-01-25 LAB — SODIUM, URINE, RANDOM: SODIUM UR: 40 mmol/L (ref 28–272)

## 2016-01-25 LAB — OSMOLALITY, URINE: OSMOLALITY UR: 487 mosm/kg (ref 50–1200)

## 2016-01-25 LAB — OSMOLALITY: Osmolality: 291 mOsm/kg (ref 278–305)

## 2016-01-26 ENCOUNTER — Other Ambulatory Visit: Payer: Self-pay | Admitting: Nurse Practitioner

## 2016-01-30 ENCOUNTER — Other Ambulatory Visit: Payer: Self-pay | Admitting: Nurse Practitioner

## 2016-02-01 ENCOUNTER — Other Ambulatory Visit: Payer: Self-pay | Admitting: Nurse Practitioner

## 2016-02-04 ENCOUNTER — Other Ambulatory Visit: Payer: Self-pay | Admitting: *Deleted

## 2016-02-04 MED ORDER — DIVALPROEX SODIUM 250 MG PO DR TAB: DELAYED_RELEASE_TABLET | ORAL | 3 refills | Status: DC

## 2016-02-04 NOTE — Telephone Encounter (Signed)
Daughter requested refill to be sent to pharmacy.

## 2016-04-14 ENCOUNTER — Inpatient Hospital Stay (HOSPITAL_COMMUNITY): Payer: Medicare Other

## 2016-04-14 ENCOUNTER — Inpatient Hospital Stay (HOSPITAL_COMMUNITY)
Admission: EM | Admit: 2016-04-14 | Discharge: 2016-04-19 | DRG: 308 | Disposition: A | Discharge status: Skilled Nursing Facility | Payer: Medicare Other | Attending: Internal Medicine | Admitting: Internal Medicine

## 2016-04-14 ENCOUNTER — Encounter (HOSPITAL_COMMUNITY): Payer: Self-pay | Admitting: *Deleted

## 2016-04-14 ENCOUNTER — Emergency Department (HOSPITAL_COMMUNITY): Payer: Medicare Other

## 2016-04-14 DIAGNOSIS — F1010 Alcohol abuse, uncomplicated: Secondary | ICD-10-CM | POA: Diagnosis present

## 2016-04-14 DIAGNOSIS — R748 Abnormal levels of other serum enzymes: Secondary | ICD-10-CM | POA: Diagnosis not present

## 2016-04-14 DIAGNOSIS — I10 Essential (primary) hypertension: Secondary | ICD-10-CM

## 2016-04-14 DIAGNOSIS — I509 Heart failure, unspecified: Secondary | ICD-10-CM

## 2016-04-14 DIAGNOSIS — Z8249 Family history of ischemic heart disease and other diseases of the circulatory system: Secondary | ICD-10-CM | POA: Diagnosis not present

## 2016-04-14 DIAGNOSIS — R296 Repeated falls: Secondary | ICD-10-CM | POA: Diagnosis present

## 2016-04-14 DIAGNOSIS — R531 Weakness: Secondary | ICD-10-CM | POA: Diagnosis present

## 2016-04-14 DIAGNOSIS — I130 Hypertensive heart and chronic kidney disease with heart failure and stage 1 through stage 4 chronic kidney disease, or unspecified chronic kidney disease: Secondary | ICD-10-CM | POA: Diagnosis present

## 2016-04-14 DIAGNOSIS — E512 Wernicke's encephalopathy: Secondary | ICD-10-CM | POA: Diagnosis present

## 2016-04-14 DIAGNOSIS — I671 Cerebral aneurysm, nonruptured: Secondary | ICD-10-CM | POA: Diagnosis present

## 2016-04-14 DIAGNOSIS — I5032 Chronic diastolic (congestive) heart failure: Secondary | ICD-10-CM | POA: Diagnosis not present

## 2016-04-14 DIAGNOSIS — I4891 Unspecified atrial fibrillation: Principal | ICD-10-CM | POA: Diagnosis present

## 2016-04-14 DIAGNOSIS — S301XXA Contusion of abdominal wall, initial encounter: Secondary | ICD-10-CM | POA: Diagnosis present

## 2016-04-14 DIAGNOSIS — E7800 Pure hypercholesterolemia, unspecified: Secondary | ICD-10-CM | POA: Diagnosis present

## 2016-04-14 DIAGNOSIS — D62 Acute posthemorrhagic anemia: Secondary | ICD-10-CM | POA: Diagnosis present

## 2016-04-14 DIAGNOSIS — I248 Other forms of acute ischemic heart disease: Secondary | ICD-10-CM | POA: Diagnosis present

## 2016-04-14 DIAGNOSIS — I5033 Acute on chronic diastolic (congestive) heart failure: Secondary | ICD-10-CM | POA: Diagnosis present

## 2016-04-14 DIAGNOSIS — R7989 Other specified abnormal findings of blood chemistry: Secondary | ICD-10-CM

## 2016-04-14 DIAGNOSIS — Z87891 Personal history of nicotine dependence: Secondary | ICD-10-CM

## 2016-04-14 DIAGNOSIS — J449 Chronic obstructive pulmonary disease, unspecified: Secondary | ICD-10-CM | POA: Diagnosis present

## 2016-04-14 DIAGNOSIS — E785 Hyperlipidemia, unspecified: Secondary | ICD-10-CM | POA: Diagnosis present

## 2016-04-14 DIAGNOSIS — Z79899 Other long term (current) drug therapy: Secondary | ICD-10-CM

## 2016-04-14 DIAGNOSIS — T148XXA Other injury of unspecified body region, initial encounter: Secondary | ICD-10-CM

## 2016-04-14 DIAGNOSIS — Z832 Family history of diseases of the blood and blood-forming organs and certain disorders involving the immune mechanism: Secondary | ICD-10-CM | POA: Diagnosis not present

## 2016-04-14 DIAGNOSIS — R569 Unspecified convulsions: Secondary | ICD-10-CM | POA: Diagnosis present

## 2016-04-14 DIAGNOSIS — W19XXXA Unspecified fall, initial encounter: Secondary | ICD-10-CM | POA: Diagnosis present

## 2016-04-14 DIAGNOSIS — N179 Acute kidney failure, unspecified: Secondary | ICD-10-CM | POA: Diagnosis present

## 2016-04-14 DIAGNOSIS — F10239 Alcohol dependence with withdrawal, unspecified: Secondary | ICD-10-CM | POA: Diagnosis present

## 2016-04-14 DIAGNOSIS — N183 Chronic kidney disease, stage 3 unspecified: Secondary | ICD-10-CM

## 2016-04-14 DIAGNOSIS — Z880 Allergy status to penicillin: Secondary | ICD-10-CM | POA: Diagnosis not present

## 2016-04-14 DIAGNOSIS — R778 Other specified abnormalities of plasma proteins: Secondary | ICD-10-CM | POA: Diagnosis present

## 2016-04-14 HISTORY — DX: Chronic kidney disease, stage 3 unspecified: N18.30

## 2016-04-14 HISTORY — DX: Alcohol abuse, uncomplicated: F10.10

## 2016-04-14 HISTORY — DX: Chronic kidney disease, stage 3 (moderate): N18.3

## 2016-04-14 LAB — PROTIME-INR
INR: 0.91
Prothrombin Time: 12.3 seconds (ref 11.4–15.2)

## 2016-04-14 LAB — CBC
HCT: 38.9 % — ABNORMAL LOW (ref 39.0–52.0)
Hemoglobin: 13.6 g/dL (ref 13.0–17.0)
MCH: 32 pg (ref 26.0–34.0)
MCHC: 35 g/dL (ref 30.0–36.0)
MCV: 91.5 fL (ref 78.0–100.0)
PLATELETS: 134 10*3/uL — AB (ref 150–400)
RBC: 4.25 MIL/uL (ref 4.22–5.81)
RDW: 13.8 % (ref 11.5–15.5)
WBC: 7.7 10*3/uL (ref 4.0–10.5)

## 2016-04-14 LAB — MAGNESIUM: MAGNESIUM: 1.9 mg/dL (ref 1.7–2.4)

## 2016-04-14 LAB — TROPONIN I: TROPONIN I: 0.35 ng/mL — AB (ref <0.03)

## 2016-04-14 LAB — BASIC METABOLIC PANEL
ANION GAP: 11 (ref 5–15)
BUN: 15 mg/dL (ref 6–20)
CO2: 26 mmol/L (ref 22–32)
Calcium: 9.3 mg/dL (ref 8.9–10.3)
Chloride: 103 mmol/L (ref 101–111)
Creatinine, Ser: 1.41 mg/dL — ABNORMAL HIGH (ref 0.61–1.24)
GFR, EST AFRICAN AMERICAN: 54 mL/min — AB (ref >60)
GFR, EST NON AFRICAN AMERICAN: 47 mL/min — AB (ref >60)
Glucose, Bld: 156 mg/dL — ABNORMAL HIGH (ref 65–99)
POTASSIUM: 3.8 mmol/L (ref 3.5–5.1)
SODIUM: 140 mmol/L (ref 135–145)

## 2016-04-14 LAB — I-STAT TROPONIN, ED: Troponin i, poc: 0.16 ng/mL (ref 0.00–0.08)

## 2016-04-14 LAB — TSH: TSH: 1.992 u[IU]/mL (ref 0.350–4.500)

## 2016-04-14 LAB — BRAIN NATRIURETIC PEPTIDE: B NATRIURETIC PEPTIDE 5: 1172.2 pg/mL — AB (ref 0.0–100.0)

## 2016-04-14 MED ORDER — SODIUM CHLORIDE 0.9% FLUSH
3.0000 mL | Freq: Two times a day (BID) | INTRAVENOUS | Status: DC
  Administered 2016-04-15 – 2016-04-18 (×5): 3 mL via INTRAVENOUS

## 2016-04-14 MED ORDER — LORAZEPAM 2 MG/ML IJ SOLN
0.0000 mg | Freq: Four times a day (QID) | INTRAMUSCULAR | Status: DC
  Administered 2016-04-14 – 2016-04-15 (×2): 2 mg via INTRAVENOUS
  Filled 2016-04-14: qty 2
  Filled 2016-04-14 (×2): qty 1

## 2016-04-14 MED ORDER — DILTIAZEM LOAD VIA INFUSION
20.0000 mg | Freq: Once | INTRAVENOUS | Status: AC
  Administered 2016-04-14: 20 mg via INTRAVENOUS
  Filled 2016-04-14: qty 20

## 2016-04-14 MED ORDER — NITROGLYCERIN 0.4 MG SL SUBL: 0.4000 mg | SUBLINGUAL_TABLET | SUBLINGUAL | Status: DC | PRN

## 2016-04-14 MED ORDER — SODIUM CHLORIDE 0.9 % IV SOLN: 250.0000 mL | INTRAVENOUS | Status: DC | PRN

## 2016-04-14 MED ORDER — FOLIC ACID 1 MG PO TABS
1.0000 mg | ORAL_TABLET | Freq: Every day | ORAL | Status: DC
  Administered 2016-04-14 – 2016-04-19 (×6): 1 mg via ORAL
  Filled 2016-04-14 (×6): qty 1

## 2016-04-14 MED ORDER — KETOROLAC TROMETHAMINE 0.5 % OP SOLN
1.0000 [drp] | Freq: Four times a day (QID) | OPHTHALMIC | Status: DC
  Administered 2016-04-15 – 2016-04-19 (×17): 1 [drp] via OPHTHALMIC
  Filled 2016-04-14: qty 3

## 2016-04-14 MED ORDER — HEPARIN (PORCINE) IN NACL 100-0.45 UNIT/ML-% IJ SOLN
750.0000 [IU]/h | INTRAMUSCULAR | Status: DC
  Administered 2016-04-14 – 2016-04-15 (×2): 750 [IU]/h via INTRAVENOUS
  Filled 2016-04-14 (×2): qty 250

## 2016-04-14 MED ORDER — SODIUM CHLORIDE 0.9% FLUSH: 3.0000 mL | INTRAVENOUS | Status: DC | PRN

## 2016-04-14 MED ORDER — GATIFLOXACIN 0.5 % OP SOLN
1.0000 [drp] | Freq: Four times a day (QID) | OPHTHALMIC | Status: DC
  Administered 2016-04-15 – 2016-04-19 (×17): 1 [drp] via OPHTHALMIC
  Filled 2016-04-14 (×2): qty 2.5

## 2016-04-14 MED ORDER — FUROSEMIDE 10 MG/ML IJ SOLN
40.0000 mg | Freq: Two times a day (BID) | INTRAMUSCULAR | Status: DC
  Administered 2016-04-14: 40 mg via INTRAVENOUS
  Filled 2016-04-14: qty 4

## 2016-04-14 MED ORDER — AMLODIPINE BESYLATE 5 MG PO TABS
10.0000 mg | ORAL_TABLET | Freq: Every day | ORAL | Status: DC
  Administered 2016-04-15: 10 mg via ORAL
  Filled 2016-04-14 (×3): qty 2

## 2016-04-14 MED ORDER — LORAZEPAM 1 MG PO TABS: 1.0000 mg | ORAL_TABLET | Freq: Four times a day (QID) | ORAL | Status: DC | PRN

## 2016-04-14 MED ORDER — LORAZEPAM 2 MG/ML IJ SOLN: 1.0000 mg | Freq: Four times a day (QID) | INTRAMUSCULAR | Status: DC | PRN

## 2016-04-14 MED ORDER — DIVALPROEX SODIUM 250 MG PO DR TAB
250.0000 mg | DELAYED_RELEASE_TABLET | Freq: Three times a day (TID) | ORAL | Status: DC
  Administered 2016-04-14 – 2016-04-19 (×14): 250 mg via ORAL
  Filled 2016-04-14 (×14): qty 1

## 2016-04-14 MED ORDER — LEVALBUTEROL HCL 1.25 MG/0.5ML IN NEBU: 1.2500 mg | INHALATION_SOLUTION | Freq: Four times a day (QID) | RESPIRATORY_TRACT | Status: DC | PRN

## 2016-04-14 MED ORDER — VITAMIN B-1 100 MG PO TABS
100.0000 mg | ORAL_TABLET | Freq: Every day | ORAL | Status: DC
  Administered 2016-04-14 – 2016-04-19 (×6): 100 mg via ORAL
  Filled 2016-04-14 (×6): qty 1

## 2016-04-14 MED ORDER — ACETAMINOPHEN 325 MG PO TABS
650.0000 mg | ORAL_TABLET | ORAL | Status: DC | PRN
Start: 2016-04-14 — End: 2016-04-19

## 2016-04-14 MED ORDER — ATORVASTATIN CALCIUM 10 MG PO TABS
10.0000 mg | ORAL_TABLET | Freq: Every day | ORAL | Status: DC
  Administered 2016-04-15 – 2016-04-18 (×4): 10 mg via ORAL
  Filled 2016-04-14 (×4): qty 1

## 2016-04-14 MED ORDER — DILTIAZEM HCL 100 MG IV SOLR
5.0000 mg/h | INTRAVENOUS | Status: DC
  Administered 2016-04-14: 15 mg/h via INTRAVENOUS
  Administered 2016-04-14: 5 mg/h via INTRAVENOUS
  Administered 2016-04-15: 15 mg/h via INTRAVENOUS
  Filled 2016-04-14 (×4): qty 100

## 2016-04-14 MED ORDER — ZOLPIDEM TARTRATE 5 MG PO TABS: 5.0000 mg | ORAL_TABLET | Freq: Every evening | ORAL | Status: DC | PRN

## 2016-04-14 MED ORDER — ASPIRIN 325 MG PO TABS
325.0000 mg | ORAL_TABLET | Freq: Every day | ORAL | Status: DC
  Administered 2016-04-15 – 2016-04-16 (×2): 325 mg via ORAL
  Filled 2016-04-14 (×2): qty 1

## 2016-04-14 MED ORDER — DIFLUPREDNATE 0.05 % OP EMUL
1.0000 [drp] | Freq: Every day | OPHTHALMIC | Status: DC
Start: 2016-04-14 — End: 2016-04-16

## 2016-04-14 MED ORDER — LORAZEPAM 2 MG/ML IJ SOLN: 0.0000 mg | Freq: Two times a day (BID) | INTRAMUSCULAR | Status: DC

## 2016-04-14 MED ORDER — THIAMINE HCL 100 MG/ML IJ SOLN: 100.0000 mg | Freq: Every day | INTRAMUSCULAR | Status: DC

## 2016-04-14 MED ORDER — LEVETIRACETAM 500 MG PO TABS
500.0000 mg | ORAL_TABLET | Freq: Two times a day (BID) | ORAL | Status: DC
  Administered 2016-04-14 – 2016-04-19 (×10): 500 mg via ORAL
  Filled 2016-04-14 (×10): qty 1

## 2016-04-14 MED ORDER — ADULT MULTIVITAMIN W/MINERALS CH
1.0000 | ORAL_TABLET | Freq: Every day | ORAL | Status: DC
  Administered 2016-04-14 – 2016-04-19 (×6): 1 via ORAL
  Filled 2016-04-14 (×6): qty 1

## 2016-04-14 MED ORDER — HEPARIN BOLUS VIA INFUSION
3000.0000 [IU] | Freq: Once | INTRAVENOUS | Status: AC
  Administered 2016-04-14: 3000 [IU] via INTRAVENOUS
  Filled 2016-04-14: qty 3000

## 2016-04-14 MED ORDER — MORPHINE SULFATE (PF) 4 MG/ML IV SOLN: 2.0000 mg | INTRAVENOUS | Status: DC | PRN

## 2016-04-14 NOTE — ED Notes (Signed)
Pt requesting something to eat.  

## 2016-04-14 NOTE — Consult Note (Signed)
Consult Note    Patient ID: [name] MRN: [id number], DOB/AGE: 78-Dec-1942   Admit date: 04/14/2016   Primary Physician: [name], NP Primary Cardiologist: New  Patient Profile    76 yo male with PMH of COPD, HTN, HLD, Seizures, tobacco use, ETOH use, brain aneurysm who presented with increasing lower extremity edema, and falls.  Past Medical History    Past Medical History:  Diagnosis Date  . Aneurysm ([place])   . COPD (chronic obstructive pulmonary disease) ([place])   . Hyperchloremia   . Hypercholesteremia   . Hyperpotassemia   . Hypertension   . Hypertensive renal disease, benign   . Seizures ([place])    has not had a seizure in 15 yrs  . Tobacco use     Past Surgical History:  Procedure Laterality Date  . 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, 1996  . EYE SURGERY     December 2017     Allergies  Allergies  Allergen Reactions  . Penicillins Other (See Comments)    History of Present Illness    76 year old male with past medical history of  COPD, HTN, HLD, Seizures, tobacco use, brain aneurysm. Patient currently lives at home with a brother and sister. Reports they have a home health aid that comes in to provide care for his sister. Patient denies ever been seen by cardiologist prior to this admission, but does report family history of CAD with his father passing from an MI in his 59s.  Patient states over the past couple weeks she's noticed increasing lower extremity edema, weakness, and has fallen at least twice. His sister at the bedside reports that she came to check on him this morning, noticed he was kneeling in the floor by his bed, and unable to stand. Reports she hasn't get him back in the bed, and went to run some errands. States that she came back he was again on the side of the bed. Patient denies any presyncope, syncope, chest pain, dizziness, lightheadedness, nausea vomiting, or diaphoresis. At this point his sister decided to bring him to the  ER.  In the ED his labs showed stable electrolytes, creatinine 1.41, BNP 1172, troponin 0.16. Chest x-ray showed COPD, but no acute findings. EKG on arrival showed rapid A. fib, with rates in the 170s. He was started on IV Cardizem, and titrated up to 15 mg/hr with rate improvement. Patient reports he has been increasingly weak over the past couple of weeks, so it is unclear when he first developed A. Fib. He is unaware of any palpitations. Of note sister reports he use ETOH on a daily basis, and drinks to the point he passes out.   Home Medications    Prior to Admission medications   Medication Sig Start Date End Date Taking? Authorizing Provider  amLODipine (NORVASC) 10 MG tablet TAKE 1 TABLET ONCE A DAY FOR BLOOD PRESSURE 12/24/15  Yes [name], NP  atorvastatin (LIPITOR) 10 MG tablet TAKE 1 TABLET (10 MG TOTAL) BY MOUTH DAILY. 12/24/15  Yes [name], NP  BESIVANCE 0.6 % SUSP Place 1 drop into the left eye See admin instructions. Uses every 4-12 hours 03/18/16  Yes Historical Provider, MD  divalproex (DEPAKOTE) 250 MG DR tablet TAKE 1 TABLET BY MOUTH 3 TIMES A DAY FOR SEIZURES 01/28/16  Yes [name], NP  DUREZOL 0.05 % EMUL Place 1 drop into the left eye daily. 03/18/16  Yes Historical Provider, MD  levETIRAcetam (KEPPRA) 500 MG  tablet Take one tablet by mouth twice daily 12/24/15  Yes [name], NP  PROLENSA 0.07 % SOLN Place 1 drop into the left eye daily. 03/18/16  Yes Historical Provider, MD    Family History    Family History  Problem Relation Age of Onset  . Cancer Mother   . Congestive Heart Failure Father   . Alzheimer's disease Sister   . Congestive Heart Failure Sister   . Clotting disorder Sister   . Congestive Heart Failure Brother   . Clotting disorder Sister     Social History    Social History   Social History  . Marital status: Single    Spouse name: N/A  . Number of children: N/A  . Years of education: N/A   Occupational  History  . Not on file.   Social History Main Topics  . Smoking status: Former Smoker    Years: 56.00  . Smokeless tobacco: Never Used     Comment: On average about 6 cig daily   . Alcohol use 0.0 oz/week     Comment: 2-3 beer a day  . Drug use: No  . Sexual activity: Not on file   Other Topics Concern  . Not on file   Social History Narrative  . No narrative on file     Review of Systems    General:  No chills, fever, night sweats or weight changes.  Cardiovascular:  See HPI Dermatological: No rash, lesions/masses Respiratory: No cough, dyspnea Urologic: No hematuria, dysuria Abdominal:   No nausea, vomiting, diarrhea, bright red blood per rectum, melena, or hematemesis Neurologic:  No visual changes, wkns, changes in mental status. All other systems reviewed and are otherwise negative except as noted above.  Physical Exam    Blood pressure (!) 165/127, pulse (!) 148, temperature 98.9 F (37.2 C), temperature source Oral, resp. rate 15, SpO2 99 %.  General: Pleasant Thin older AA male, NAD Psych: Normal affect. Neuro: Alert and oriented X 3. Moves all extremities spontaneously. HEENT: Normal  Neck: Supple without bruits or JVD. Lungs:  Resp regular and unlabored, CTA. Heart: Irregularly irregular no s3, s4, 3/6 systolic murmurs. Abdomen: Soft, non-tender, non-distended, BS + x 4.  Extremities: No clubbing, cyanosis, 2+ bilateral pitting edema. Has some lichenification to bilateral lower extremities. DP/PT/Radials 2+ and equal bilaterally.  Labs    Troponin [facility] Test)  Recent Labs  04/14/16 1632  TROPIPOC 0.16*   No results for input(s): CKTOTAL, CKMB, TROPONINI in the last 72 hours. Lab Results  Component Value Date   WBC 7.7 04/14/2016   HGB 13.6 04/14/2016   HCT 38.9 (L) 04/14/2016   MCV 91.5 04/14/2016   PLT 134 (L) 04/14/2016    Recent Labs Lab 04/14/16 1612  NA 140  K 3.8  CL 103  CO2 26  BUN 15  CREATININE 1.41*  CALCIUM 9.3   GLUCOSE 156*   Lab Results  Component Value Date   CHOL 217 (H) 10/15/2015   HDL 107 10/15/2015   LDLCALC 97 10/15/2015   TRIG 64 10/15/2015   No results found for: [facility]   Radiology Studies    Dg Chest Portable 1 View  Result Date: 04/14/2016 CLINICAL DATA:  Generalized weakness. History of seizure activity, COPD, former smoker. EXAM: PORTABLE CHEST 1 VIEW COMPARISON:  Chest x-ray of April 28, 2012 FINDINGS: The lungs are well-expanded and clear. The heart and pulmonary vascularity are normal. The mediastinum is normal in width. There is calcification in  the wall of the aortic arch. There is no pleural effusion. The bony thorax exhibits no acute abnormality. IMPRESSION: COPD.  There is no active cardiopulmonary disease. Electronically Signed   By: David  Martinique M.D.   On: 04/14/2016 15:10    ECG & Cardiac Imaging    EKG: AF RVR with rate in the 170s  Assessment & Plan    76 yo male with PMH of COPD, HTN, HLD, Seizures, tobacco use, ETOH use, brain aneurysm who presented with increasing lower extremity edema, and falls.  1. AF RVR: Reports ongoing weakness over the past couple weeks, with increased lower extremity edema. Unaware of any palpitations. Presented to the ED today after having 2 episodes of weakness where he was found beside his bed and unable to get up. In the ED his EKG showed A. fib RVR with rates in the 170s. He was started on IV Cardizem, with great improvement. Also noted to have elevated BNP, and 2+ pitting lower extremity edema on exam. Denies any previous cardiac history. -- Continue IV Cardizem, start IV heparin. Will to readdress the need for anticoagulation given his hx of ETOH and hx of falls.  -- Check echo given symptoms concerning for heart failure, and systolic murmur noted on exam.  -- Will start on IV lasix 40mg  BID and monitor I&Os, and daily weights.  2. HTN: On home Norvasc, hold given IV Cardizem. Follow blood pressure.  3. HLD: Continue  statin  4. Seizures: Reports he has not had a seizure in many years, would continue on Depakote. Sister reports that he has been having these falling episodes, though they don't sound like seizures, may need neurology consult. Has not seen a neurologist in many years.   5. Elevated Trop: 0.16 initially. Likely demand ischemia, would continue to trend enzymes.    Barnet Pall, NP-C Pager [phone] 04/14/2016, 7:12 PM

## 2016-04-14 NOTE — ED Notes (Signed)
Pt given [place] sandwich per VO Dr. Vanita Panda

## 2016-04-14 NOTE — Progress Notes (Signed)
[place] for heparin Indication: atrial fibrillation  Allergies  Allergen Reactions  . Penicillins Other (See Comments)    Patient Measurements: Heparin Dosing Weight: 56 kg  Vital Signs: Temp: 98.9 F (37.2 C) (01/08 1451) Temp Source: Oral (01/08 1451) BP: 151/100 (01/08 1900) Pulse Rate: 105 (01/08 1900)  Labs:  Recent Labs  04/14/16 1612  HGB 13.6  HCT 38.9*  PLT 134*  LABPROT 12.3  INR 0.91  CREATININE 1.41*    Medical History: Past Medical History:  Diagnosis Date  . Aneurysm ([place])   . COPD (chronic obstructive pulmonary disease) ([place])   . Hyperchloremia   . Hypercholesteremia   . Hyperpotassemia   . Hypertension   . Hypertensive renal disease, benign   . Seizures ([place])    has not had a seizure in 15 yrs  . Tobacco use     Assessment: Presents with weakness and increased lower extremity edema. Found to have new onset AFib with RVR. Is not on anticoagulation prior to admission. Has hx of seizures and falls. Planning to start heparin infusion while inpatient and assessing pros/cons of long-term outpatient anticoagulation.    Goal of Therapy:  Heparin level 0.3-0.7 units/ml Monitor platelets by anticoagulation protocol: Yes   Plan:  -Heparin bolus 3000 units x1 then 750 units/hr -Daily HL, CBC -First level with am labs -F/u plans for long term anticoagulation    Harvel Quale 04/14/2016,8:15 PM

## 2016-04-14 NOTE — H&P (Signed)
History and Physical    [name]:[id number] DOB: 1940-10-19 DOA: 04/14/2016  Referring MD/NP/PA:   PCP: [name], NP   Patient coming from:  The patient is coming from home.  At baseline, pt is independent for most of ADL.   Chief Complaint: weakness and fall, leg edema  HPI: [name] is a 76 y.o. male with medical history significant of COPD, HTN, HLD, seizures, former smoker, alcohol abuse, s/p of brain aneurysm repair, who presents with weakness and fall.  Per his sister, over the past couple of weeks, pt was noted to have increasing lower extremity edema, weakness. Pt has fallen at least twice. His sister at the bedside reports that she came to check on him this morning, noticed he was kneeling in the floor by his bed, and unable to stand. Pt denies unilateral weakness, numbness or tingling in extremities. No vision change or hearing loss. He denies syncope, chest pain, shortness rest, fever, chills. No nausea, vomiting, diarrhea or abdominal pain. EKG on arrival showed rapid A. fib, with rates in the 170s. He was started on IV Cardizem, and titrated up to 15 mg/hr with rate improvement. Of note sister reports pt drinks ETOH on a daily basis, and drinks to the point he passes out.   ED Course: pt was found to have BNP 1172, TSH is 1.992, troponin 0.16. INR 0.91, stable renal function, WBC 7.7, temperature normal, tachycardia, oxygen saturation 96% on room air. Chest x-ray showed COPD, but no acute findings. Patient is admitted to SDU as inpt. Card, dr. Radford Pax was consulted.   Review of Systems:   General: no fevers, chills, no changes in body weight, has poor appetite, has fatigue HEENT: no blurry vision, hearing changes or sore throat Respiratory: no dyspnea, has dry coughing, no wheezing CV: no chest pain, no palpitations GI: no nausea, vomiting, abdominal pain, diarrhea, constipation GU: no dysuria, burning on urination, increased urinary frequency, hematuria    Ext: has leg edema Neuro: no unilateral weakness, numbness, or tingling, no vision change or hearing loss Skin: no rash, no skin tear. MSK: No muscle spasm, no deformity, no limitation of range of movement in spin Heme: No easy bruising.  Travel history: No recent long distant travel.  Allergy:  Allergies  Allergen Reactions  . Penicillins Other (See Comments)    Past Medical History:  Diagnosis Date  . Alcohol abuse   . Aneurysm ([place])   . CKD (chronic kidney disease), stage III   . COPD (chronic obstructive pulmonary disease) ([place])   . Hyperchloremia   . Hypercholesteremia   . Hyperpotassemia   . Hypertension   . Hypertensive renal disease, benign   . Seizures ([place])    has not had a seizure in 15 yrs  . Tobacco use     Past Surgical History:  Procedure Laterality Date  . 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, 1996  . EYE SURGERY     December 2017    Social History:  reports that he has quit smoking. He quit after 56.00 years of use. He has never used smokeless tobacco. He reports that he drinks alcohol. He reports that he does not use drugs.  Family History:  Family History  Problem Relation Age of Onset  . Cancer Mother   . Congestive Heart Failure Father   . Alzheimer's disease Sister   . Congestive Heart Failure Sister   . Clotting disorder Sister   . Congestive Heart Failure Brother   .  Clotting disorder Sister      Prior to Admission medications   Medication Sig Start Date End Date Taking? Authorizing Provider  amLODipine (NORVASC) 10 MG tablet TAKE 1 TABLET ONCE A DAY FOR BLOOD PRESSURE 12/24/15  Yes [name], NP  atorvastatin (LIPITOR) 10 MG tablet TAKE 1 TABLET (10 MG TOTAL) BY MOUTH DAILY. 12/24/15  Yes [name], NP  BESIVANCE 0.6 % SUSP Place 1 drop into the left eye See admin instructions. Uses every 4-12 hours 03/18/16  Yes Historical Provider, MD  divalproex (DEPAKOTE) 250 MG DR tablet TAKE 1 TABLET BY MOUTH 3 TIMES A DAY FOR SEIZURES  01/28/16  Yes [name], NP  DUREZOL 0.05 % EMUL Place 1 drop into the left eye daily. 03/18/16  Yes Historical Provider, MD  levETIRAcetam (KEPPRA) 500 MG tablet Take one tablet by mouth twice daily 12/24/15  Yes [name], NP  PROLENSA 0.07 % SOLN Place 1 drop into the left eye daily. 03/18/16  Yes Historical Provider, MD    Physical Exam: Vitals:   04/14/16 1745 04/14/16 1815 04/14/16 1830 04/14/16 1900  BP: 140/100 (!) 157/102 111/98 151/100  Pulse: (!) 143 81 (!) 140 105  Resp: 15 20 19 21   Temp:      TempSrc:      SpO2: 99% 99% 98% 98%   General: Not in acute distress HEENT:       Eyes: PERRL, EOMI, no scleral icterus.       ENT: No discharge from the ears and nose, no pharynx injection, no tonsillar enlargement.        Neck: positive JVD, no bruit, no mass felt. Heme: No neck lymph node enlargement. Cardiac: S1/S2, RRR, No murmurs, No gallops or rubs. Respiratory: No rales, wheezing, rhonchi or rubs. GI: Soft, nondistended, nontender, no rebound pain, no organomegaly, BS present. GU: No hematuria Ext: 2+ pitting leg edema bilaterally. 2+DP/PT pulse bilaterally. Musculoskeletal: No joint deformities, No joint redness or warmth, no limitation of ROM in spin. Skin: No rashes.  Neuro: Alert, oriented X3, cranial nerves II-XII grossly intact, moves all extremities normally. Psych: Patient is not psychotic, no suicidal or hemocidal ideation.  Labs on Admission: I have personally reviewed following labs and imaging studies  CBC:  Recent Labs Lab 04/14/16 1612  WBC 7.7  HGB 13.6  HCT 38.9*  MCV 91.5  PLT 941*   Basic Metabolic Panel:  Recent Labs Lab 04/14/16 1612  NA 140  K 3.8  CL 103  CO2 26  GLUCOSE 156*  BUN 15  CREATININE 1.41*  CALCIUM 9.3  MG 1.9   GFR: CrCl cannot be calculated (Unknown ideal weight.). Liver Function Tests: No results for input(s): AST, ALT, ALKPHOS, BILITOT, PROT, ALBUMIN in the last 168 hours. No results for  input(s): LIPASE, AMYLASE in the last 168 hours. No results for input(s): AMMONIA in the last 168 hours. Coagulation Profile:  Recent Labs Lab 04/14/16 1612  INR 0.91   Cardiac Enzymes:  Recent Labs Lab 04/14/16 2100  TROPONINI 0.35*   BNP (last 3 results) No results for input(s): PROBNP in the last 8760 hours. HbA1C: No results for input(s): HGBA1C in the last 72 hours. CBG: No results for input(s): GLUCAP in the last 168 hours. Lipid Profile: No results for input(s): CHOL, HDL, LDLCALC, TRIG, CHOLHDL, LDLDIRECT in the last 72 hours. Thyroid Function Tests:  Recent Labs  04/14/16 1612  TSH 1.992   Anemia Panel: No results for input(s): VITAMINB12, FOLATE, FERRITIN, TIBC,  IRON, RETICCTPCT in the last 72 hours. Urine analysis: No results found for: COLORURINE, APPEARANCEUR, LABSPEC, PHURINE, GLUCOSEU, HGBUR, BILIRUBINUR, KETONESUR, PROTEINUR, UROBILINOGEN, NITRITE, LEUKOCYTESUR Sepsis Labs: @LABRCNTIP (procalcitonin:4,lacticidven:4) )No results found for this or any previous visit (from the past 240 hour(s)).   Radiological Exams on Admission: Ct Head Wo Contrast  Result Date: 04/14/2016 CLINICAL DATA:  Initial evaluation for acute fall. EXAM: CT HEAD WITHOUT CONTRAST TECHNIQUE: Contiguous axial images were obtained from the base of the skull through the vertex without intravenous contrast. COMPARISON:  Prior CT from 04/25/2012. FINDINGS: Brain: Diffuse prominence of the CSF containing spaces is compatible with generalized cerebral atrophy. Patchy and confluent hypodensity within the periventricular and deep white matter both cerebral hemispheres most consistent with chronic microvascular ischemic disease. Encephalomalacia out within the high right frontal lobe is stable. Additional focal encephalomalacia within the high left frontal lobe also stable. Chronic encephalomalacia present at the anterior left temporal pole. Aneurysm clip present at the level of the left ICA  terminus. No acute intracranial hemorrhage. No evidence for acute large vessel territory infarct. No mass lesion, midline shift or mass effect. Ventricular prominence related underlying atrophy without hydrocephalus. No extra-axial fluid collection. Vascular: No hyperdense vessel. Aneurysm clip at the left ICA terminus. Intracranial atherosclerosis noted. A a a Skull: Scalp soft tissues demonstrate no acute abnormality. Post craniotomy changes present at the left frontal calvarium. Remote craniotomy/ burr hole at the right frontal calvarium. No acute skull fracture or other abnormality. Sinuses/Orbits: Globes and orbital soft tissues within normal limits. Patient is status post lens extraction on the left. Paranasal sinuses are clear. No mastoid effusion. IMPRESSION: 1. No acute intracranial process identified. 2. Postsurgical and chronic changes as above, stable. 3. Underlying cerebral atrophy with chronic microvascular ischemic disease, also stable. Electronically Signed   By: Jeannine Boga M.D.   On: 04/14/2016 21:09   Dg Chest Portable 1 View  Result Date: 04/14/2016 CLINICAL DATA:  Generalized weakness. History of seizure activity, COPD, former smoker. EXAM: PORTABLE CHEST 1 VIEW COMPARISON:  Chest x-ray of April 28, 2012 FINDINGS: The lungs are well-expanded and clear. The heart and pulmonary vascularity are normal. The mediastinum is normal in width. There is calcification in the wall of the aortic arch. There is no pleural effusion. The bony thorax exhibits no acute abnormality. IMPRESSION: COPD.  There is no active cardiopulmonary disease. Electronically Signed   By: David  Martinique M.D.   On: 04/14/2016 15:10     EKG: Independently reviewed. A. fib with RVR   Assessment/Plan Principal Problem:   Atrial fibrillation with RVR (HCC) Active Problems:   Seizures (HCC)   Essential hypertension, benign   Hyperlipidemia LDL goal <130   CHF (congestive heart failure) (HCC)   Elevated  troponin   CKD (chronic kidney disease), stage III   Fall   Alcohol abuse   Atrial fibrillation with RVR ([place]): not sure about the exact onset time. Pt has ongoing weakness over the past couple weeks, indicating onset >48 hours. This is likely triggered by alcohol abuse and possible CHF given his elevated BNP, JVD and 2+ pitting lower extremity edema. Card was consulted--> recommended IV Cardizem, and IV heparin and IV lasix.  -will admit to tele bed as inpt. -Highly appreciate cardiology's consultation -IV Cardizem -IV heparin -Will start on IV lasix 40 mg BID per Card. -will check Free T4  (TSH is 1.992 today)  HTN:  -continue home Norvasc, -also on IV Cardizem.  HLD:  -Continue statin, lipitor  Seizures: Reports he  has not had a seizure in many years.  -seizure precaution -continue Depakote and Keppra  Fall: CT-head negative for acute intracranial abnormalities. Sister reports that he has been having falling episodes, which don't sound like seizures. Has not seen a neurologist in many years.  -will check EEG  Elevated Trop: 0.16 initially. Likely demand ischemia - cycle CE q6 x3 and repeat her EKG in the am  - Nitroglycerin, Morphine, and aspirin, lipitor  - Risk factor stratification: will check FLP, UDS and A1C  - 2d echo  CKD (chronic kidney disease), stage III: Stable. Baseline creatinine 1.2-1.4. His Cre is 1.41 on admission. - f/u renal Fx by BMP  Alcohol abuse: -Did counseling about the importance of quitting drinking -CIWA protocol   DVT ppx: On IV Heparin  Code Status: Full code Family Communication:  Yes, patient's sister  at bed side Disposition Plan:  Anticipate discharge back to previous home environment Consults called:  Card, dr. Radford Pax Admission status: Inpatient/tele   Date of Service 04/14/2016    Ivor Costa [facility] Pager [phone]  If 7PM-7AM, please contact night-coverage [email or web address] Password [facility] 04/14/2016, 11:23 PM

## 2016-04-14 NOTE — ED Triage Notes (Signed)
Pt received 324mg  of asprin en route

## 2016-04-14 NOTE — ED Provider Notes (Signed)
[facility] DEPT Provider Note   CSN: [id number] Arrival date & time: 04/14/16  1447     History   Chief Complaint Chief Complaint  Patient presents with  . Tachycardia    HPI [name] is a 76 y.o. male.  HPI Pt presents to the ED with complaints of general weakness.  Pt has been feeling weaker and fatigued over the last couple of weeks.  It was gradual in onset and seems to be progressing.  Today he was feeling weaker and fatigued.  Another member was getting a medical evaluation and he mentioned his symptoms and they suggested coming in for evaluation.  [name] denies having any trouble with chest pain. He denies any shortness of breath. He denies any abdominal pain. He does have some mild leg swelling but that has not been concerning for him. He denies any vomiting or diarrhea. No fevers or chills. Past Medical History:  Diagnosis Date  . Aneurysm ([place])   . COPD (chronic obstructive pulmonary disease) ([place])   . Hyperchloremia   . Hypercholesteremia   . Hyperpotassemia   . Hypertension   . Hypertensive renal disease, benign   . Seizures ([place])    has not had a seizure in 15 yrs  . Tobacco use     Patient Active Problem List   Diagnosis Date Noted  . PVD (peripheral vascular disease) ([place]) 03/07/2014  . Cognitive impairment 03/07/2014  . Essential hypertension, benign 02/02/2013  . Routine general medical examination at a health care facility 02/02/2013  . Need for prophylactic vaccination and inoculation against influenza 02/02/2013  . Hyperlipidemia LDL goal <130 02/02/2013  . Hypertensive renal disease 02/02/2013  . Seizures ([place]) 07/28/2012  . Tobacco use disorder 07/28/2012    Past Surgical History:  Procedure Laterality Date  . 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, 1996  . EYE SURGERY     December 2017       Home Medications    Prior to Admission medications   Medication Sig Start Date End Date Taking? Authorizing Provider  amLODipine (NORVASC) 10  MG tablet TAKE 1 TABLET ONCE A DAY FOR BLOOD PRESSURE 12/24/15  Yes [name], NP  atorvastatin (LIPITOR) 10 MG tablet TAKE 1 TABLET (10 MG TOTAL) BY MOUTH DAILY. 12/24/15  Yes [name], NP  BESIVANCE 0.6 % SUSP Place 1 drop into the left eye See admin instructions. Uses every 4-12 hours 03/18/16  Yes Historical Provider, MD  divalproex (DEPAKOTE) 250 MG DR tablet TAKE 1 TABLET BY MOUTH 3 TIMES A DAY FOR SEIZURES 01/28/16  Yes [name], NP  DUREZOL 0.05 % EMUL Place 1 drop into the left eye daily. 03/18/16  Yes Historical Provider, MD  levETIRAcetam (KEPPRA) 500 MG tablet Take one tablet by mouth twice daily 12/24/15  Yes [name], NP  PROLENSA 0.07 % SOLN Place 1 drop into the left eye daily. 03/18/16  Yes Historical Provider, MD    Family History Family History  Problem Relation Age of Onset  . Cancer Mother   . Congestive Heart Failure Father   . Alzheimer's disease Sister   . Congestive Heart Failure Sister   . Clotting disorder Sister   . Congestive Heart Failure Brother   . Clotting disorder Sister     Social History Social History  Substance Use Topics  . Smoking status: Former Smoker    Years: 56.00  . Smokeless tobacco: Never Used     Comment: On average about 6 cig  daily   . Alcohol use 0.0 oz/week     Comment: 2-3 beer a day     Allergies   Penicillins   Review of Systems Review of Systems  All other systems reviewed and are negative.    Physical Exam Updated Vital Signs BP (!) 165/127   Pulse (!) 148   Temp 98.9 F (37.2 C) (Oral)   Resp 15   SpO2 99%   Physical Exam  Constitutional: He appears well-developed and well-nourished. No distress.  HENT:  Head: Normocephalic and atraumatic.  Right Ear: External ear normal.  Left Ear: External ear normal.  Eyes: Conjunctivae are normal. Right eye exhibits no discharge. Left eye exhibits no discharge. No scleral icterus.  Neck: Neck supple. No tracheal deviation present.    Cardiovascular: Normal rate and intact distal pulses.  An irregularly irregular rhythm present.  Pulmonary/Chest: Effort normal and breath sounds normal. No stridor. No respiratory distress. He has no wheezes. He has no rales.  Abdominal: Soft. Bowel sounds are normal. He exhibits no distension. There is no tenderness. There is no rebound and no guarding.  Musculoskeletal: He exhibits edema (trace edema in the lower extremities ). He exhibits no tenderness.  Neurological: He is alert. He has normal strength. No cranial nerve deficit (no facial droop, extraocular movements intact, no slurred speech) or sensory deficit. He exhibits normal muscle tone. He displays no seizure activity. Coordination normal.  Skin: Skin is warm and dry. No rash noted.  Psychiatric: He has a normal mood and affect.  Nursing note and vitals reviewed.    ED Treatments / Results  Labs (all labs ordered are listed, but only abnormal results are displayed) Labs Reviewed  BASIC METABOLIC PANEL - Abnormal; Notable for the following:       Result Value   Glucose, Bld 156 (*)    Creatinine, Ser 1.41 (*)    GFR calc non Af Amer 47 (*)    GFR calc Af Amer 54 (*)    All other components within normal limits  BRAIN NATRIURETIC PEPTIDE - Abnormal; Notable for the following:    B Natriuretic Peptide 1,172.2 (*)    All other components within normal limits  CBC - Abnormal; Notable for the following:    HCT 38.9 (*)    Platelets 134 (*)    All other components within normal limits  I-STAT TROPOININ, ED - Abnormal; Notable for the following:    Troponin i, poc 0.16 (*)    All other components within normal limits  MAGNESIUM  TSH  PROTIME-INR  I-STAT TROPOININ, ED    EKG  EKG Interpretation  Date/Time:  Monday April 14 2016 14:58:08 EST Ventricular Rate:  172 PR Interval:    QRS Duration: 93 QT Interval:  291 QTC Calculation: 493 R Axis:   -77 Text Interpretation:  Atrial fibrillation with rapid V-rate Left  anterior fascicular block LVH with secondary repolarization abnormality Anterior infarct, old Artifact in lead(s) I II aVR aVL aVF a fib is new since last tracing Confirmed by Lydiann Bonifas  MD-J, Terianna Peggs ([id number]) on 04/14/2016 3:02:22 PM Also confirmed by Emmit Oriley  MD-J, Nakoa Ganus [phone]), editor Navarro, [place], [place] ([id number])  on 04/14/2016 4:13:48 PM       Radiology Dg Chest Portable 1 View  Result Date: 04/14/2016 CLINICAL DATA:  Generalized weakness. History of seizure activity, COPD, former smoker. EXAM: PORTABLE CHEST 1 VIEW COMPARISON:  Chest x-ray of April 28, 2012 FINDINGS: The lungs are well-expanded and clear. The heart and pulmonary vascularity  are normal. The mediastinum is normal in width. There is calcification in the wall of the aortic arch. There is no pleural effusion. The bony thorax exhibits no acute abnormality. IMPRESSION: COPD.  There is no active cardiopulmonary disease. Electronically Signed   By: David  Martinique M.D.   On: 04/14/2016 15:10    Procedures .Critical Care Performed by: Dorie Rank Authorized by: Dorie Rank   Critical care provider statement:    Critical care time (minutes):  95   Critical care was time spent personally by me on the following activities:  Discussions with consultants, evaluation of patient's response to treatment, examination of patient, ordering and performing treatments and interventions, ordering and review of laboratory studies, ordering and review of radiographic studies, pulse oximetry, re-evaluation of patient's condition, obtaining history from patient or surrogate and review of old charts   (including critical care time)  Medications Ordered in ED Medications  diltiazem (CARDIZEM) 1 mg/mL load via infusion 20 mg (20 mg Intravenous Bolus from Bag 04/14/16 1559)    And  diltiazem (CARDIZEM) 100 mg in dextrose 5 % 100 mL (1 mg/mL) infusion (10 mg/hr Intravenous Rate/Dose Change 04/14/16 1647)     Initial Impression / Assessment and Plan / ED Course  I have  reviewed the triage vital signs and the nursing notes.  Pertinent labs & imaging results that were available during my care of the patient were reviewed by me and considered in my medical decision making (see chart for details).  Clinical Course as of Apr 14 1806  Mon Apr 14, 2016  1531 EKG and monitor demonstrates A fib RVR.  Pt is not aware of the rapid heart rate.  Onset unclear.  Not a candidate for cardioversion.  Will start on cardizem drip.  Proceed with further workup  [JK]  1807 Patient remains tachycardic despite the Cardizem drip. We'll need to continue to titrate.  [JK]    Clinical Course User Index [JK] Dorie Rank, MD    Patient presented to the emergency room with A. fib with a rapid ventricular rate. Patient does have a mildly elevated troponin and an elevated BNP. I suspect this is related to the duration of his atrial fibrillation. Patient does not recognized that his heart is racing he's only felt fatigued. It's possible he may have been this way for the last week or 2.  We will continue to titrate the Cardizem infusion. I will consult with cardiology for admission.  Final Clinical Impressions(s) / ED Diagnoses   Final diagnoses:  Atrial fibrillation with RVR ([place])      Dorie Rank, MD 04/14/16 0938

## 2016-04-14 NOTE — ED Triage Notes (Signed)
Per EMS- pt was found by family sitting in the floor at home. Denies fall states that he sat down due to feeling weak. Pt was taken to his bed but then stood again and had the same thing happen again. EMS was called and pt was noted to be in SVT from 160-200. Pt alert, denying pain and sob. Pt only reporting lower extremity weakness. No hx of heart problems.

## 2016-04-14 NOTE — ED Notes (Signed)
Elevated trop reported to Dr. Tomi Bamberger

## 2016-04-14 NOTE — ED Notes (Signed)
Pt to CT at this time.

## 2016-04-14 NOTE — ED Triage Notes (Signed)
Pt reports increased weakness to rt leg for 2-3 days.

## 2016-04-14 NOTE — ED Notes (Signed)
Delay in lab draw,  Pt not in room at this time. 

## 2016-04-15 ENCOUNTER — Inpatient Hospital Stay (HOSPITAL_COMMUNITY): Payer: Medicare Other

## 2016-04-15 DIAGNOSIS — R569 Unspecified convulsions: Secondary | ICD-10-CM

## 2016-04-15 DIAGNOSIS — I509 Heart failure, unspecified: Secondary | ICD-10-CM

## 2016-04-15 LAB — CBC
HCT: 36.4 % — ABNORMAL LOW (ref 39.0–52.0)
Hemoglobin: 12.2 g/dL — ABNORMAL LOW (ref 13.0–17.0)
MCH: 30.9 pg (ref 26.0–34.0)
MCHC: 33.5 g/dL (ref 30.0–36.0)
MCV: 92.2 fL (ref 78.0–100.0)
PLATELETS: 122 10*3/uL — AB (ref 150–400)
RBC: 3.95 MIL/uL — ABNORMAL LOW (ref 4.22–5.81)
RDW: 13.9 % (ref 11.5–15.5)
WBC: 6.7 10*3/uL (ref 4.0–10.5)

## 2016-04-15 LAB — BASIC METABOLIC PANEL
Anion gap: 8 (ref 5–15)
BUN: 11 mg/dL (ref 6–20)
CALCIUM: 9.1 mg/dL (ref 8.9–10.3)
CHLORIDE: 102 mmol/L (ref 101–111)
CO2: 31 mmol/L (ref 22–32)
CREATININE: 1.25 mg/dL — AB (ref 0.61–1.24)
GFR, EST NON AFRICAN AMERICAN: 54 mL/min — AB (ref >60)
Glucose, Bld: 98 mg/dL (ref 65–99)
Potassium: 3.6 mmol/L (ref 3.5–5.1)
SODIUM: 141 mmol/L (ref 135–145)

## 2016-04-15 LAB — TROPONIN I
TROPONIN I: 0.46 ng/mL — AB (ref <0.03)
Troponin I: 0.35 ng/mL (ref <0.03)

## 2016-04-15 LAB — ECHOCARDIOGRAM COMPLETE: Weight: 1936.52 oz

## 2016-04-15 LAB — LIPID PANEL
CHOLESTEROL: 164 mg/dL (ref 0–200)
HDL: 103 mg/dL (ref >40)
LDL Cholesterol: 51 mg/dL (ref 0–99)
TRIGLYCERIDES: 48 mg/dL (ref <150)
Total CHOL/HDL Ratio: 1.6 RATIO
VLDL: 10 mg/dL (ref 0–40)

## 2016-04-15 LAB — RAPID URINE DRUG SCREEN, HOSP PERFORMED
AMPHETAMINES: NOT DETECTED
BARBITURATES: NOT DETECTED
Benzodiazepines: POSITIVE — AB
Cocaine: NOT DETECTED
Opiates: NOT DETECTED
TETRAHYDROCANNABINOL: NOT DETECTED

## 2016-04-15 LAB — HEPARIN LEVEL (UNFRACTIONATED)
HEPARIN UNFRACTIONATED: 0.56 [IU]/mL (ref 0.30–0.70)
Heparin Unfractionated: 0.41 IU/mL (ref 0.30–0.70)

## 2016-04-15 LAB — T4, FREE: FREE T4: 1.36 ng/dL — AB (ref 0.61–1.12)

## 2016-04-15 MED ORDER — PANTOPRAZOLE SODIUM 40 MG PO TBEC
40.0000 mg | DELAYED_RELEASE_TABLET | Freq: Every day | ORAL | Status: DC
  Administered 2016-04-15 – 2016-04-19 (×5): 40 mg via ORAL
  Filled 2016-04-15 (×5): qty 1

## 2016-04-15 MED ORDER — DILTIAZEM HCL 30 MG PO TABS
30.0000 mg | ORAL_TABLET | Freq: Four times a day (QID) | ORAL | Status: DC
  Administered 2016-04-15 – 2016-04-16 (×3): 30 mg via ORAL
  Filled 2016-04-15 (×3): qty 1

## 2016-04-15 MED ORDER — FUROSEMIDE 20 MG PO TABS
20.0000 mg | ORAL_TABLET | Freq: Every day | ORAL | Status: DC
  Administered 2016-04-15 – 2016-04-17 (×3): 20 mg via ORAL
  Filled 2016-04-15 (×3): qty 1

## 2016-04-15 MED ORDER — POTASSIUM CHLORIDE CRYS ER 20 MEQ PO TBCR
40.0000 meq | EXTENDED_RELEASE_TABLET | Freq: Once | ORAL | Status: AC
  Administered 2016-04-15: 40 meq via ORAL
  Filled 2016-04-15: qty 2

## 2016-04-15 NOTE — Evaluation (Signed)
Physical Therapy Evaluation Patient Details Name: [name] MRN: [id number] DOB: [birth year] Today's Date: 04/15/2016   History of Present Illness  76 year old male with COPD, HTN, HLD, seizures, former smoker, alcohol abuse, s/p of brain aneurysm repair who presented weakness weakness and fall.  Clinical Impression  Pt presents with impaired cognition, impaired balance, gait and funcitonal mobility. Pt not safe to be home alone at this time due to risk of falls. PT recommending 24 hours supervision/assist vs. SNF.  Pt will continue to benefit from PT services to address deficits and increase functional independence.    Follow Up Recommendations SNF;Supervision/Assistance - 24 hour    Equipment Recommendations   (TBD)    Recommendations for Other Services       Precautions / Restrictions Precautions Precautions: Fall Restrictions Weight Bearing Restrictions: No      Mobility  Bed Mobility Overal bed mobility: Needs Assistance Bed Mobility: Supine to Sit     Supine to sit: Max assist     General bed mobility comments: pt in recliner to start PT eval  Transfers Overall transfer level: Needs assistance   Transfers: Sit to/from Stand;Stand Pivot Transfers Sit to Stand: Min assist Stand pivot transfers: Mod assist       General transfer comment: pt impulsive, decreased awareness of IV pole and obstacles in his way, mod A for balance and safety  Ambulation/Gait Ambulation/Gait assistance: Max assist Ambulation Distance (Feet): 20 Feet Assistive device: Rolling walker (2 wheeled)       General Gait Details: intially attempt gait without AD (as pt states he does not use AD at home), pt unable to advance Rt LE despite tactile and verbal cuing, max A to keep balance x 2 attempts without AD.  Then pt able to gait in straight path with RW with min A but requires max A for turning for safety, balance and RW management. Pt with noted decreased problem solving, awarenss and  impaired balance reactions  Stairs            Wheelchair Mobility    Modified Rankin (Stroke Patients Only)       Balance Overall balance assessment: Needs assistance           Standing balance-Leahy Scale: Zero Standing balance comment: max A to prevent falls during gait                             Pertinent Vitals/Pain Pain Assessment: No/denies pain    Home Living Family/patient expects to be discharged to:: Private residence Living Arrangements: Other relatives Available Help at Discharge: Friend(s) Type of Home: House Home Access: Stairs to enter     Home Layout: One level Home Equipment: Electronics engineer Comments: pt inconsistent with info regarding home set up    Prior Function Level of Independence: Independent               Hand Dominance   Dominant Hand: Right    Extremity/Trunk Assessment   Upper Extremity Assessment Upper Extremity Assessment: Generalized weakness    Lower Extremity Assessment Lower Extremity Assessment: RLE deficits/detail;LLE deficits/detail RLE Deficits / Details: knee ext 3-/5, DF 3-/5, hip flex 3-/5 LLE Deficits / Details: grossly 3/5       Communication   Communication: HOH  Cognition Arousal/Alertness: Awake/alert Behavior During Therapy: Agitated Overall Cognitive Status: No family/caregiver present to determine baseline cognitive functioning  General Comments: Pt somewhat impulsive and agitated. decreased attention to task, decreased awareness of deficits, decreased problem solving    General Comments      Exercises     Assessment/Plan    PT Assessment Patient needs continued PT services  PT Problem List Decreased strength;Decreased balance;Decreased mobility;Decreased activity tolerance;Decreased coordination;Decreased safety awareness;Decreased knowledge of use of DME;Decreased cognition;Cardiopulmonary status limiting activity          PT Treatment  Interventions DME instruction;Functional mobility training;Balance training;Patient/family education;Gait training;Therapeutic activities;Neuromuscular re-education;Radiographer, therapeutic;Therapeutic exercise;Cognitive remediation;Manual techniques;Modalities    PT Goals (Current goals can be found in the Care Plan section)  Acute Rehab PT Goals Patient Stated Goal: to go home PT Goal Formulation: Patient unable to participate in goal setting Time For Goal Achievement: 04/22/16 Potential to Achieve Goals: Fair    Frequency Min 3X/week   Barriers to discharge Inaccessible home environment;Decreased caregiver support      Co-evaluation               End of Session   Activity Tolerance: Patient tolerated treatment well Patient left: in chair;with chair alarm set;with call bell/phone within reach Nurse Communication: Mobility status         Time: 1400-1415 PT Time Calculation (min) (ACUTE ONLY): 15 min   Charges:   PT Evaluation $PT Eval Moderate Complexity: 1 Procedure     PT G Codes:        [name] 2016-05-01, 2:36 PM

## 2016-04-15 NOTE — Evaluation (Signed)
Occupational Therapy Evaluation Patient Details Name: [name] MRN: [id number] DOB: [birth year] Today's Date: 04/15/2016    History of Present Illness 76 year old male with COPD, HTN, HLD, seizures, former smoker, alcohol abuse, s/p of brain aneurysm repair who presented weakness weakness and fall.   Clinical Impression   Pt admitted with above and presents to OT with deficits listed below (see OT Problem List) impacting his ability to complete ADLs independently.  Pt demonstrating decreased attention to task and visual attention to Lt environment, however with decreased ability to assess due to agitation.  Pt resistant to assistance from therapist during sit <> stand and transfers.  Noted visual/perceptual deficits during self-feeding task.  Pt will benefit from OT acutely to address deficits to increase independence prior to d/c home with HHOT.  No family present during eval, if family unavailable to provide recommended assistance may need to pursue another venue.    Follow Up Recommendations  Home health OT;Supervision/Assistance - 24 hour    Equipment Recommendations  None recommended by OT    Recommendations for Other Services       Precautions / Restrictions Precautions Precautions: Fall Restrictions Weight Bearing Restrictions: No      Mobility Bed Mobility Overal bed mobility: Needs Assistance Bed Mobility: Supine to Sit     Supine to sit: Max assist     General bed mobility comments: pt in recliner to start PT eval  Transfers Overall transfer level: Needs assistance   Transfers: Sit to/from Stand;Stand Pivot Transfers Sit to Stand: Min assist Stand pivot transfers: Mod assist       General transfer comment: pt impulsive, decreased awareness of IV pole and obstacles in his way, mod A for balance and safety         ADL Overall ADL's : Needs assistance/impaired Eating/Feeding: Set up;Minimal assistance;Sitting Eating/Feeding Details (indicate cue  type and reason): Pt received in bed not able to recognize tray placed on bedside table and had utensils in bed.  Pt demonstrated difficulty with locating items on Lt side of tray and difficulty bringing food to mouth (question coordination vs vision)                     Toilet Transfer: Minimal assistance;BSC;Stand-pivot Toilet Transfer Details (indicate cue type and reason): Min assist stand pivot with cues for safety as pt attempting to perform transfer on his own and telling therapist to move out of the way.         Functional mobility during ADLs: Minimal assistance;Cueing for safety       Vision Vision Assessment?: Vision impaired- to be further tested in functional context Additional Comments: Pt unable to recognize food tray on tray over bed, utensils in bed without awareness.  Decreased attention to items on Lt side of tray.     Perception Perception Comments: Pt unable to recognize food tray on tray over bed, utensils in bed without awareness.  Decreased attention to items on Lt side of tray.       Pertinent Vitals/Pain Pain Assessment: No/denies pain     Hand Dominance Right   Extremity/Trunk Assessment Upper Extremity Assessment Upper Extremity Assessment: Generalized weakness   Lower Extremity Assessment Lower Extremity Assessment: RLE deficits/detail;LLE deficits/detail RLE Deficits / Details: knee ext 3-/5, DF 3-/5, hip flex 3-/5 LLE Deficits / Details: grossly 3/5       Communication Communication Communication: HOH   Cognition Arousal/Alertness: Awake/alert Behavior During Therapy: Agitated Overall Cognitive Status: No family/caregiver present to determine  baseline cognitive functioning                 General Comments: Pt somewhat impulsive and agitated. decreased attention to task, decreased awareness of deficits, decreased problem solving              Home Living Family/patient expects to be discharged to:: Private  residence Living Arrangements: Other relatives Available Help at Discharge: Friend(s) Type of Home: House Home Access: Stairs to enter     Home Layout: One level     Bathroom Shower/Tub: Walk-in shower         Home Equipment: Shower seat   Additional Comments: pt inconsistent with info regarding home set up      Prior Functioning/Environment Level of Independence: Independent                 OT Problem List: Decreased strength;Decreased activity tolerance;Impaired balance (sitting and/or standing);Impaired vision/perception;Decreased coordination;Decreased safety awareness;Cardiopulmonary status limiting activity;Impaired UE functional use   OT Treatment/Interventions: Self-care/ADL training;Neuromuscular education;DME and/or AE instruction;Therapeutic activities;Visual/perceptual remediation/compensation;Patient/family education;Balance training    OT Goals(Current goals can be found in the care plan section) Acute Rehab OT Goals Patient Stated Goal: to go home OT Goal Formulation: With patient Time For Goal Achievement: 04/29/16 Potential to Achieve Goals: Good  OT Frequency: Min 2X/week   Barriers to D/C:    unsure as no family present during eval          End of Session Equipment Utilized During Treatment: Gait belt Nurse Communication: Mobility status  Activity Tolerance: Patient tolerated treatment well Patient left: in chair;with call bell/phone within reach;with chair alarm set   Time: 1315-1334 OT Time Calculation (min): 19 min Charges:  OT General Charges $OT Visit: 1 Procedure OT Evaluation $OT Eval Moderate Complexity: 1 Procedure G-CodesSimonne Come, [id number] 04/15/2016, 2:40 PM

## 2016-04-15 NOTE — Progress Notes (Signed)
Pt had 3 beats Vtach. asymptomatic

## 2016-04-15 NOTE — Progress Notes (Signed)
Patient Name: [name] Date of Encounter: 04/15/2016  Primary Cardiologist: [facility] Problem List     Principal Problem:   Atrial fibrillation with RVR [facility]) Active Problems:   Seizures ([place])   Essential hypertension, benign   Hyperlipidemia LDL goal <130   CHF (congestive heart failure) (HCC)   Elevated troponin   CKD (chronic kidney disease), stage III   Fall   Alcohol abuse     Subjective   Patient is not very interactive this morning. He reluctantly answers questions, wants to sleep. Denies chest pain, palpitations, shortness of breath. Sister at bedside says that he can be that way.  Inpatient Medications    Scheduled Meds: . amLODipine  10 mg Oral Daily  . aspirin  325 mg Oral Daily  . atorvastatin  10 mg Oral q1800  . Difluprednate  1 drop Left Eye Daily  . divalproex  250 mg Oral S9H  . folic acid  1 mg Oral Daily  . gatifloxacin  1 drop Left Eye QID  . ketorolac  1 drop Left Eye QID  . levETIRAcetam  500 mg Oral BID  . LORazepam  0-4 mg Intravenous Q6H   Followed by  . [START ON 04/17/2016] LORazepam  0-4 mg Intravenous Q12H  . multivitamin with minerals  1 tablet Oral Daily  . sodium chloride flush  3 mL Intravenous Q12H  . thiamine  100 mg Oral Daily   Or  . thiamine  100 mg Intravenous Daily   Continuous Infusions: . diltiazem (CARDIZEM) infusion 10 mg/hr (04/15/16 0532)  . heparin 750 Units/hr (04/14/16 2130)   PRN Meds: sodium chloride, acetaminophen, levalbuterol, LORazepam **OR** LORazepam, morphine injection, nitroGLYCERIN, sodium chloride flush, zolpidem   Vital Signs    Vitals:   04/14/16 1830 04/14/16 1900 04/14/16 2303 04/15/16 0500  BP: 111/98 151/100 (!) 163/104   Pulse: (!) 140 105 63   Resp: 19 21 (!) 23   Temp:   98.2 F (36.8 C) 98.1 F (36.7 C)  TempSrc:   Oral Oral  SpO2: 98% 98% 100%   Weight:   121 lb 7.6 oz (55.1 kg) 121 lb 0.5 oz (54.9 kg)    Intake/Output Summary (Last 24 hours) at 04/15/16 0828 Last data  filed at 04/15/16 0532  Gross per 24 hour  Intake            60.25 ml  Output              900 ml  Net          -839.75 ml   Filed Weights   04/14/16 2303 04/15/16 0500  Weight: 121 lb 7.6 oz (55.1 kg) 121 lb 0.5 oz (54.9 kg)    Physical Exam    GEN: Thin AA male, in no acute distress.  HEENT: Grossly normal. Small indentation in skull  Neck: Supple, no JVD, carotid bruits, or masses. Cardiac: RRR, no rubs, or gallops. 3/6 systolic murmur best appreciated in the left sternal border region. No clubbing, cyanosis, edema.  Radials/DP/PT 2+ and equal bilaterally.  Respiratory:  Respirations regular and unlabored, clear to auscultation bilaterally. GI: Soft, nontender, nondistended, BS + x 4. MS: no deformity or atrophy. Skin: warm and dry, no rash. Neuro:  Strength and sensation are intact. Psych: AAOx3.    Labs    CBC  Recent Labs  04/14/16 1612  WBC 7.7  HGB 13.6  HCT 38.9*  MCV 91.5  PLT 734*   Basic Metabolic Panel  Recent  Labs  04/14/16 1612  NA 140  K 3.8  CL 103  CO2 26  GLUCOSE 156*  BUN 15  CREATININE 1.41*  CALCIUM 9.3  MG 1.9   Liver Function Tests No results for input(s): AST, ALT, ALKPHOS, BILITOT, PROT, ALBUMIN in the last 72 hours. No results for input(s): LIPASE, AMYLASE in the last 72 hours. Cardiac Enzymes  Recent Labs  04/14/16 2100 04/15/16 0211  TROPONINI 0.35* 0.46*   BNP Invalid input(s): POCBNP D-Dimer No results for input(s): DDIMER in the last 72 hours. Hemoglobin A1C No results for input(s): HGBA1C in the last 72 hours. Fasting Lipid Panel  Recent Labs  04/15/16 0211  CHOL 164  HDL 103  LDLCALC 51  TRIG 48  CHOLHDL 1.6   Thyroid Function Tests  Recent Labs  04/14/16 1612  TSH 1.992    Telemetry    Atrial fibrillation in the 80's, no pauses - Personally Reviewed  ECG    04/15/16- Atrial fibrillation, 82 bpm - Personally Reviewed  Radiology    Ct Head Wo Contrast  Result Date: 04/14/2016 CLINICAL  DATA:  Initial evaluation for acute fall. EXAM: CT HEAD WITHOUT CONTRAST TECHNIQUE: Contiguous axial images were obtained from the base of the skull through the vertex without intravenous contrast. COMPARISON:  Prior CT from 04/25/2012. FINDINGS: Brain: Diffuse prominence of the CSF containing spaces is compatible with generalized cerebral atrophy. Patchy and confluent hypodensity within the periventricular and deep white matter both cerebral hemispheres most consistent with chronic microvascular ischemic disease. Encephalomalacia out within the high right frontal lobe is stable. Additional focal encephalomalacia within the high left frontal lobe also stable. Chronic encephalomalacia present at the anterior left temporal pole. Aneurysm clip present at the level of the left ICA terminus. No acute intracranial hemorrhage. No evidence for acute large vessel territory infarct. No mass lesion, midline shift or mass effect. Ventricular prominence related underlying atrophy without hydrocephalus. No extra-axial fluid collection. Vascular: No hyperdense vessel. Aneurysm clip at the left ICA terminus. Intracranial atherosclerosis noted. A a a Skull: Scalp soft tissues demonstrate no acute abnormality. Post craniotomy changes present at the left frontal calvarium. Remote craniotomy/ burr hole at the right frontal calvarium. No acute skull fracture or other abnormality. Sinuses/Orbits: Globes and orbital soft tissues within normal limits. Patient is status post lens extraction on the left. Paranasal sinuses are clear. No mastoid effusion. IMPRESSION: 1. No acute intracranial process identified. 2. Postsurgical and chronic changes as above, stable. 3. Underlying cerebral atrophy with chronic microvascular ischemic disease, also stable. Electronically Signed   By: Jeannine Boga M.D.   On: 04/14/2016 21:09   Dg Chest Portable 1 View  Result Date: 04/14/2016 CLINICAL DATA:  Generalized weakness. History of seizure  activity, COPD, former smoker. EXAM: PORTABLE CHEST 1 VIEW COMPARISON:  Chest x-ray of April 28, 2012 FINDINGS: The lungs are well-expanded and clear. The heart and pulmonary vascularity are normal. The mediastinum is normal in width. There is calcification in the wall of the aortic arch. There is no pleural effusion. The bony thorax exhibits no acute abnormality. IMPRESSION: COPD.  There is no active cardiopulmonary disease. Electronically Signed   By: David  Martinique M.D.   On: 04/14/2016 15:10    Cardiac Studies   Echo pending  Patient Profile     76 yo male with PMH of COPD, HTN, HLD, Seizures, tobacco use, ETOH use, brain aneurysm who presented with increasing lower extremity edema, and falls.  Assessment & Plan    1. Atrial fibrillation  with rapid ventricular response -Reported progressive weakness and lower extremity edema over past couple of weeks. Unaware of any palpitations. He has had frequent falls and is a daily alcohol user. EKG on arrival showed atrial fibrillation with rapid ventricular response rate in the 170s. Started on IV Cardizem with improvement and is in atrial fibrillation with rates in the 80s this morning. -Atrial fibrillation is of unknown duration -IV heparin initiated for stroke risk reduction related to atrial fibrillation. CHA2DS2-VASc score at least 4 ( HTN, AGE (2), Vascular disease), may be higher if reduced EF, pending echo. Pt is not a good candidate for anticoagulation due to his daily heavy alcohol use, frequent falls and history of seizures. Full dose aspirin started. Will add PPI. -We'll check heart function per echocardiogram and transition to oral Cardizem if preserved ejection fraction or beta blocker if reduced EF  2. Heart failure symptoms -Elevated BNP of 1172, 2+ pitting lower extremity edema -Baseline weight has been 122-125 pounds over the last year per office notes. Weight on admission was 121 pounds -Chest x-ray showed COPD and no acute  cardiopulmonary disease -Symptoms concerning for heart failure. Echocardiogram is pending. -Patient has risk factor for heart failure including daily alcohol use, hypertension, CRI -Received lasix 40 mg IV last evening with good urine output and resolution of edema -Net I&O is -839 -Will change IV Lasix to oral at 20 mg daily and continue to monitor symptoms, intake and output and labs  3. Elevated troponin -Troponins mildly elevated 0.16 (POC), 0.35, 0.46. No chest pain. This is likely related to demand ischemia insetting of rapid atrial fibrillation -Will continue to cycle troponins -May benefit from nuclear stress test for further ischemic workup. Echocardiogram is pending -Ischemia may be an underlying cause of atrial fibrillation, however, could be related to heavy alcohol use and/or chronic renal insufficiency  4. Hypertension -Blood pressures have been running high systolic BP 671-245 on diltiazem drip while here, will continue home amlodipine -Continue to monitor  5. Chronic renal insufficiency, Stage III -Baseline creatinine in the 1.3-1.6 range. Creatinine on admission 04/14/16 1.41 -Monitor creatinine levels especially in light of diuresis  6. Hyperlipidemia -Lipid panel, 04/15/16, reveals total cholesterol 164, HDL 103, LDL 51 -Continue statin  7. Seizures -No recent seizure activity, patient maintained on Depakote -Recent falls per sister, do not sound like seizures -EEG has been done per internal medicine   Signed, Daune Perch, NP  04/15/2016, 8:28 AM  Pager: [phone]  History and all data above reviewed.  Patient examined.  I agree with the findings as above.  He wakes up and denies any acute symptoms.  However, more sleepy than usual per his sister. The patient exam reveals [id number]  ,  Lungs: Clear  ,  Abd: Positive bowel sounds, no rebound no guarding, Ext No edema  .  All available labs, radiology testing, previous records reviewed. Agree with documented  assessment and plan. Atrial fib:  We will transition to PO Cardizem.  However, it is unlikely that he will be an anticoagulation candidate or a cardioversion candidate.  Edema:  Echo pending.    Minus Breeding  8:57 AM  04/15/2016

## 2016-04-15 NOTE — Procedures (Signed)
ELECTROENCEPHALOGRAM REPORT  Date of Study: 04/15/2016  Patient's Name: [name] MRN: [id number] Date of Birth: [birth year]  Referring Provider: Dr. Ivor Costa  Clinical History: This is a 76 year old man with a history of brain aneurysm s/p fall.  Medications: levETIRAcetam (KEPPRA) tablet 500 mg  divalproex (DEPAKOTE) DR tablet 250 mg  acetaminophen (TYLENOL) tablet 650 mg  amLODipine (NORVASC) tablet 10 mg  aspirin tablet 325 mg  atorvastatin (LIPITOR) tablet 10 mg  diltiazem (CARDIZEM) 100 mg in dextrose 5 % 100 mL (1 mg/mL) infusion  folic acid (FOLVITE) tablet 1 mg  furosemide (LASIX) injection 40 mg  gatifloxacin (ZYMAXID) 0.5 % ophthalmic drops 1 drop  heparin ADULT infusion 100 units/mL (25000 units/283mL sodium chloride 0.45%)  ketorolac (ACULAR) 0.5 % ophthalmic solution 1 drop  levalbuterol (XOPENEX) nebulizer solution 1.25 mg  morphine 4 MG/ML injection 2 mg  multivitamin with minerals tablet 1 tablet  nitroGLYCERIN (NITROSTAT) SL tablet 0.4 mg  thiamine (B-1) injection 100 mg  thiamine (VITAMIN B-1) tablet 100 mg  zolpidem (AMBIEN) tablet 5 mg   Technical Summary: A multichannel digital EEG recording measured by the international 10-20 system with electrodes applied with paste and impedances below 5000 ohms performed in our laboratory with EKG monitoring in a predominantly drowsy and asleep patient.  Hyperventilation and photic stimulation were not performed.  The digital EEG was referentially recorded, reformatted, and digitally filtered in a variety of bipolar and referential montages for optimal display.    Description: The patient is predominantly drowsy and asleep during the recording.  During brief period of wakefulness, there is a symmetric, medium voltage 10 Hz posterior dominant rhythm that poorly attenuates with eye opening and eye closure.  The record is symmetric.  During drowsiness and sleep, there is an increase in theta and delta slowing of the  background.  Vertex waves and symmetric sleep spindles were seen.  Hyperventilation and photic stimulation were not performed.  There were no epileptiform discharges or electrographic seizures seen.    EKG lead showed irregular rhythm.  Impression: This predominantly drowsy and asleep EEG is normal.    Clinical Correlation: A normal EEG does not exclude a clinical diagnosis of epilepsy. Clinical correlation is advised.   Ellouise Newer, M.D.

## 2016-04-15 NOTE — Progress Notes (Signed)
[facility]                                                                              Patient Demographics  [name], is a 76 y.o. male, DOB - June 18, 1940, WNU:[id number]  Admit date - 04/14/2016   Admitting Physician Ivor Costa, MD  Outpatient Primary MD for the patient is [name], NP  Outpatient specialists:   LOS - 1  days    Chief Complaint  Patient presents with  . Tachycardia       Brief summary   Patient is a 76 year old male with COPD, HTN, HLD, seizures, former smoker, alcohol abuse, s/p of brain aneurysm repair who presented weakness weakness and fall. Per patient's sister, he was noted to have increasing lower extremity edema. In ED, patient was noted have rapid atrial fibrillation with heart rate in 170s. He was started on IV Cardizem. BNP 1172 Troponin 0.16. A chest x-ray showed COPD but no acute findings. Patient was admitted to stepdown unit. He was started on heparin drip.   Assessment & Plan    Principal Problem:  Atrial fibrillation with RVR [facility]):  not sure about the exact onset time. Pt has ongoing weakness over the past couple weeks, indicating onset >48 hours. This is likely triggered by alcohol abuse and possible CHF given his elevated BNP, JVD and 2+ pitting lower extremity edema.  - Continue IV Cardizem drip, IV heparin , 2-D echo - Cardiology consulted, will follow recommendations  - Persisted at the bedside, patient drinks daily and sometimes to the point that he passes out. Discussed about anticoagulation, do not think is a good candidate for anticoagulation given his history of alcohol abuse.   HTN: -Continue IV Cardizem, will hold Norvasc   HLD: -Continue statin, lipitor  Seizures:Reports he has not had a seizure in many years.  -seizure precaution -continue Depakote and Keppra  Fall:  - CT-head negative for acute intracranial abnormalities. Sister reports that he has been having falling episodes, which  don't sound like seizures. Has not seen a neurologist in many years.  - EEG normal  Elevated Trop: - Elevated troponins likely due to demand ischemia - Follow 2-D echo, cardiology consulted  CKD (chronic kidney disease), stage III: Stable. Baseline creatinine 1.2-1.4. His Cre is 1.41 on admission. -Creatinine improving, at baseline  Alcohol abuse: -Counseled against alcohol use, placed on CIWA protocol   Code Status: Full CODE STATUS  DVT Prophylaxis:Heparin  Family Communication: Discussed in detail with the patient, all imaging results, lab results explained to the patientand an sister at the bedside  Disposition Plan:   Time Spent in minutes 25 minutes  Procedures:    Consultants:   Cardiology  Antimicrobials:      Medications  Scheduled Meds: . amLODipine  10 mg Oral Daily  . aspirin  325 mg Oral Daily  . atorvastatin  10 mg Oral q1800  . Difluprednate  1 drop Left Eye Daily  . divalproex  250 mg Oral I3K  . folic acid  1 mg Oral Daily  . furosemide  20 mg Oral Daily  . gatifloxacin  1 drop Left Eye QID  .  ketorolac  1 drop Left Eye QID  . levETIRAcetam  500 mg Oral BID  . LORazepam  0-4 mg Intravenous Q6H   Followed by  . [START ON 04/17/2016] LORazepam  0-4 mg Intravenous Q12H  . multivitamin with minerals  1 tablet Oral Daily  . pantoprazole  40 mg Oral Daily  . sodium chloride flush  3 mL Intravenous Q12H  . thiamine  100 mg Oral Daily   Or  . thiamine  100 mg Intravenous Daily   Continuous Infusions: . diltiazem (CARDIZEM) infusion 10 mg/hr (04/15/16 0532)  . heparin 750 Units/hr (04/14/16 2130)   PRN Meds:.sodium chloride, acetaminophen, levalbuterol, LORazepam **OR** LORazepam, morphine injection, nitroGLYCERIN, sodium chloride flush, zolpidem   Antibiotics   Anti-infectives    None        Subjective:   [name] was seen and examined today.   Patient denies dizziness, chest pain, shortness of breath, abdominal pain, N/V/D/C, new  weakness, numbess, tingling.   Objective:   Vitals:   04/14/16 1900 04/14/16 2303 04/15/16 0500 04/15/16 0700  BP: 151/100 (!) 163/104    Pulse: 105 63  88  Resp: 21 (!) 23  18  Temp:  98.2 F (36.8 C) 98.1 F (36.7 C) 98.3 F (36.8 C)  TempSrc:  Oral Oral Oral  SpO2: 98% 100%  100%  Weight:  55.1 kg (121 lb 7.6 oz) 54.9 kg (121 lb 0.5 oz)     Intake/Output Summary (Last 24 hours) at 04/15/16 1255 Last data filed at 04/15/16 0900  Gross per 24 hour  Intake           180.25 ml  Output              900 ml  Net          -719.75 ml     Wt Readings from Last 3 Encounters:  04/15/16 54.9 kg (121 lb 0.5 oz)  01/24/16 56.1 kg (123 lb 10.1 oz)  12/13/15 56.7 kg (125 lb)     Exam  General: Alert and oriented x 3, NAD  HEENT:    Neck:   Cardiovascular: S1 S2,  irregular irregular   Respiratory: Clear to auscultation bilaterally, no wheezing, rales or rhonchi  Gastrointestinal: Soft, nontender, nondistended, + bowel sounds  Ext: no cyanosis clubbing or edema  Neuro:no new deficits   Skin: No rashes  Psych: Normal affect and demeanor, alert and oriented x3    Data Reviewed:  I have personally reviewed following labs and imaging studies  Micro Results No results found for this or any previous visit (from the past 240 hour(s)).  Radiology Reports Ct Head Wo Contrast  Result Date: 04/14/2016 CLINICAL DATA:  Initial evaluation for acute fall. EXAM: CT HEAD WITHOUT CONTRAST TECHNIQUE: Contiguous axial images were obtained from the base of the skull through the vertex without intravenous contrast. COMPARISON:  Prior CT from 04/25/2012. FINDINGS: Brain: Diffuse prominence of the CSF containing spaces is compatible with generalized cerebral atrophy. Patchy and confluent hypodensity within the periventricular and deep white matter both cerebral hemispheres most consistent with chronic microvascular ischemic disease. Encephalomalacia out within the high right frontal lobe is  stable. Additional focal encephalomalacia within the high left frontal lobe also stable. Chronic encephalomalacia present at the anterior left temporal pole. Aneurysm clip present at the level of the left ICA terminus. No acute intracranial hemorrhage. No evidence for acute large vessel territory infarct. No mass lesion, midline shift or mass effect. Ventricular prominence related underlying atrophy without  hydrocephalus. No extra-axial fluid collection. Vascular: No hyperdense vessel. Aneurysm clip at the left ICA terminus. Intracranial atherosclerosis noted. A a a Skull: Scalp soft tissues demonstrate no acute abnormality. Post craniotomy changes present at the left frontal calvarium. Remote craniotomy/ burr hole at the right frontal calvarium. No acute skull fracture or other abnormality. Sinuses/Orbits: Globes and orbital soft tissues within normal limits. Patient is status post lens extraction on the left. Paranasal sinuses are clear. No mastoid effusion. IMPRESSION: 1. No acute intracranial process identified. 2. Postsurgical and chronic changes as above, stable. 3. Underlying cerebral atrophy with chronic microvascular ischemic disease, also stable. Electronically Signed   By: Jeannine Boga M.D.   On: 04/14/2016 21:09   Dg Chest Portable 1 View  Result Date: 04/14/2016 CLINICAL DATA:  Generalized weakness. History of seizure activity, COPD, former smoker. EXAM: PORTABLE CHEST 1 VIEW COMPARISON:  Chest x-ray of April 28, 2012 FINDINGS: The lungs are well-expanded and clear. The heart and pulmonary vascularity are normal. The mediastinum is normal in width. There is calcification in the wall of the aortic arch. There is no pleural effusion. The bony thorax exhibits no acute abnormality. IMPRESSION: COPD.  There is no active cardiopulmonary disease. Electronically Signed   By: David  Martinique M.D.   On: 04/14/2016 15:10    Lab Data:  CBC:  Recent Labs Lab 04/14/16 1612 04/15/16 0912  WBC 7.7  6.7  HGB 13.6 12.2*  HCT 38.9* 36.4*  MCV 91.5 92.2  PLT 134* 253*   Basic Metabolic Panel:  Recent Labs Lab 04/14/16 1612 04/15/16 0912  NA 140 141  K 3.8 3.6  CL 103 102  CO2 26 31  GLUCOSE 156* 98  BUN 15 11  CREATININE 1.41* 1.25*  CALCIUM 9.3 9.1  MG 1.9  --    GFR: Estimated Creatinine Clearance: 38.4 mL/min (by C-G formula based on SCr of 1.25 mg/dL (H)). Liver Function Tests: No results for input(s): AST, ALT, ALKPHOS, BILITOT, PROT, ALBUMIN in the last 168 hours. No results for input(s): LIPASE, AMYLASE in the last 168 hours. No results for input(s): AMMONIA in the last 168 hours. Coagulation Profile:  Recent Labs Lab 04/14/16 1612  INR 0.91   Cardiac Enzymes:  Recent Labs Lab 04/14/16 2100 04/15/16 0211 04/15/16 0912  TROPONINI 0.35* 0.46* 0.35*   BNP (last 3 results) No results for input(s): PROBNP in the last 8760 hours. HbA1C: No results for input(s): HGBA1C in the last 72 hours. CBG: No results for input(s): GLUCAP in the last 168 hours. Lipid Profile:  Recent Labs  04/15/16 0211  CHOL 164  HDL 103  LDLCALC 51  TRIG 48  CHOLHDL 1.6   Thyroid Function Tests:  Recent Labs  04/14/16 1612 04/15/16 0912  TSH 1.992  --   FREET4  --  1.36*   Anemia Panel: No results for input(s): VITAMINB12, FOLATE, FERRITIN, TIBC, IRON, RETICCTPCT in the last 72 hours. Urine analysis: No results found for: COLORURINE, APPEARANCEUR, LABSPEC, PHURINE, GLUCOSEU, HGBUR, BILIRUBINUR, KETONESUR, PROTEINUR, UROBILINOGEN, NITRITE, Corliss Skains M.D. [facility] 04/15/2016, 12:55 PM  Pager: [phone] Between 7am to 7pm - call Pager - [phone]  After 7pm go to [email or web address] - password [email or web address]  Call night coverage person covering after 7pm

## 2016-04-15 NOTE — Progress Notes (Signed)
ANTICOAGULATION CONSULT NOTE - Follow Up Consult  Pharmacy Consult for Heparin Indication: atrial fibrillation  Allergies  Allergen Reactions  . Penicillins Other (See Comments)    Patient Measurements: Weight: 121 lb 0.5 oz (54.9 kg) Heparin Dosing Weight: 55 kg  Vital Signs: Temp: 98.3 F (36.8 C) (01/09 0700) Temp Source: Oral (01/09 0700) Pulse Rate: 88 (01/09 0700)  Labs:  Recent Labs  04/14/16 1612 04/14/16 2100 04/15/16 0211 04/15/16 0912  HGB 13.6  --   --  12.2*  HCT 38.9*  --   --  36.4*  PLT 134*  --   --  122*  LABPROT 12.3  --   --   --   INR 0.91  --   --   --   HEPARINUNFRC  --   --  0.56 0.41  CREATININE 1.41*  --   --  1.25*  TROPONINI  --  0.35* 0.46* 0.35*    Estimated Creatinine Clearance: 38.4 mL/min (by C-G formula based on SCr of 1.25 mg/dL (H)).   Medications:  Scheduled:  . amLODipine  10 mg Oral Daily  . aspirin  325 mg Oral Daily  . atorvastatin  10 mg Oral q1800  . Difluprednate  1 drop Left Eye Daily  . divalproex  250 mg Oral Y2Q  . folic acid  1 mg Oral Daily  . furosemide  20 mg Oral Daily  . gatifloxacin  1 drop Left Eye QID  . ketorolac  1 drop Left Eye QID  . levETIRAcetam  500 mg Oral BID  . LORazepam  0-4 mg Intravenous Q6H   Followed by  . [START ON 04/17/2016] LORazepam  0-4 mg Intravenous Q12H  . multivitamin with minerals  1 tablet Oral Daily  . pantoprazole  40 mg Oral Daily  . sodium chloride flush  3 mL Intravenous Q12H  . thiamine  100 mg Oral Daily   Or  . thiamine  100 mg Intravenous Daily   Infusions:  . diltiazem (CARDIZEM) infusion 10 mg/hr (04/15/16 0532)  . heparin 750 Units/hr (04/14/16 2130)    Assessment: 76 yo M presented to ED with weakness and increased LE edema.  Found to have afib with RVR and started on heparin infusion.  Heparin level is therapeutic on 750 units/hr.  No bleeding noted.  Goal of Therapy:  Heparin level 0.3-0.7 units/ml Monitor platelets by anticoagulation protocol:  Yes   Plan:  Continue heparin at 750 units/hr Daily heparin level and CBC   Kang Ishida, Pharm.D., BCPS Clinical Pharmacist Pager [phone] 04/15/2016 12:07 PM

## 2016-04-15 NOTE — Progress Notes (Signed)
[place] for heparin Indication: atrial fibrillation  Allergies  Allergen Reactions  . Penicillins Other (See Comments)    Patient Measurements: Heparin Dosing Weight: 56 kg  Vital Signs: Temp: 98.2 F (36.8 C) (01/08 2303) Temp Source: Oral (01/08 2303) BP: 163/104 (01/08 2303) Pulse Rate: 63 (01/08 2303)  Labs:  Recent Labs  04/14/16 1612 04/14/16 2100 04/15/16 0211  HGB 13.6  --   --   HCT 38.9*  --   --   PLT 134*  --   --   LABPROT 12.3  --   --   INR 0.91  --   --   HEPARINUNFRC  --   --  0.56  CREATININE 1.41*  --   --   TROPONINI  --  0.35*  --     Medical History: Past Medical History:  Diagnosis Date  . Alcohol abuse   . Aneurysm ([place])   . CKD (chronic kidney disease), stage III   . COPD (chronic obstructive pulmonary disease) ([place])   . Hyperchloremia   . Hypercholesteremia   . Hyperpotassemia   . Hypertension   . Hypertensive renal disease, benign   . Seizures ([place])    has not had a seizure in 15 yrs  . Tobacco use     Assessment: Presents with weakness and increased lower extremity edema. Found to have new onset AFib with RVR and now on heparin.  -Initial heparin level is 0.56   Goal of Therapy:  Heparin level 0.3-0.7 units/ml Monitor platelets by anticoagulation protocol: Yes   Plan:  -No heparin changes needed -Confirm heparin level later today  Hildred Laser, Pharm D 04/15/2016 3:26 AM

## 2016-04-15 NOTE — Progress Notes (Signed)
EEG completed; results pending.    

## 2016-04-16 DIAGNOSIS — I5032 Chronic diastolic (congestive) heart failure: Secondary | ICD-10-CM

## 2016-04-16 LAB — BASIC METABOLIC PANEL
Anion gap: 7 (ref 5–15)
BUN: 9 mg/dL (ref 6–20)
CHLORIDE: 103 mmol/L (ref 101–111)
CO2: 29 mmol/L (ref 22–32)
Calcium: 9 mg/dL (ref 8.9–10.3)
Creatinine, Ser: 1.21 mg/dL (ref 0.61–1.24)
GFR calc Af Amer: >60 mL/min (ref >60)
GFR calc non Af Amer: 56 mL/min — ABNORMAL LOW (ref >60)
GLUCOSE: 85 mg/dL (ref 65–99)
Potassium: 4.3 mmol/L (ref 3.5–5.1)
SODIUM: 139 mmol/L (ref 135–145)

## 2016-04-16 LAB — CBC
HCT: 36.6 % — ABNORMAL LOW (ref 39.0–52.0)
HEMOGLOBIN: 12.3 g/dL — AB (ref 13.0–17.0)
MCH: 31.4 pg (ref 26.0–34.0)
MCHC: 33.6 g/dL (ref 30.0–36.0)
MCV: 93.4 fL (ref 78.0–100.0)
Platelets: 120 10*3/uL — ABNORMAL LOW (ref 150–400)
RBC: 3.92 MIL/uL — ABNORMAL LOW (ref 4.22–5.81)
RDW: 14.3 % (ref 11.5–15.5)
WBC: 7.4 10*3/uL (ref 4.0–10.5)

## 2016-04-16 LAB — HEMOGLOBIN A1C
HEMOGLOBIN A1C: 5.2 % (ref 4.8–5.6)
MEAN PLASMA GLUCOSE: 103 mg/dL

## 2016-04-16 LAB — HEPARIN LEVEL (UNFRACTIONATED): HEPARIN UNFRACTIONATED: 0.3 [IU]/mL (ref 0.30–0.70)

## 2016-04-16 MED ORDER — DILTIAZEM HCL ER COATED BEADS 120 MG PO CP24
120.0000 mg | ORAL_CAPSULE | Freq: Every day | ORAL | Status: DC
  Administered 2016-04-16: 120 mg via ORAL
  Filled 2016-04-16: qty 1

## 2016-04-16 MED ORDER — MORPHINE SULFATE (PF) 2 MG/ML IV SOLN
1.0000 mg | INTRAVENOUS | Status: DC | PRN
Start: 2016-04-16 — End: 2016-04-19

## 2016-04-16 MED ORDER — LORAZEPAM 1 MG PO TABS: 1.0000 mg | ORAL_TABLET | Freq: Four times a day (QID) | ORAL | Status: AC | PRN

## 2016-04-16 MED ORDER — LORAZEPAM 2 MG/ML IJ SOLN: 1.0000 mg | Freq: Four times a day (QID) | INTRAMUSCULAR | Status: AC | PRN

## 2016-04-16 MED ORDER — NICOTINE 21 MG/24HR TD PT24
21.0000 mg | MEDICATED_PATCH | Freq: Every day | TRANSDERMAL | Status: DC
  Administered 2016-04-18 – 2016-04-19 (×2): 21 mg via TRANSDERMAL
  Filled 2016-04-16 (×3): qty 1

## 2016-04-16 NOTE — NC FL2 (Signed)
[place] MEDICAID FL2 LEVEL OF CARE SCREENING TOOL     IDENTIFICATION  Patient Name: [name] Birthdate: [birth year] Sex: male Admission Date (Current Location): 04/14/2016  [facility] and [place] Number:  Herbalist and Address:  [facility], [place] [address] [place], [place] [address]      Provider Number: [id number]  Attending Physician Name and Address:  Mendel Corning, MD  Relative Name and Phone Number:       Current Level of Care: Hospital Recommended Level of Care: [place] Prior Approval Number:    Date Approved/Denied: 04/16/16 PASRR Number:    Discharge Plan: SNF    Current Diagnoses: Patient Active Problem List   Diagnosis Date Noted  . Atrial fibrillation with RVR ([place]) 04/14/2016  . Chronic diastolic heart failure ([place]) 04/14/2016  . Weakness 04/14/2016  . Elevated troponin 04/14/2016  . Fall 04/14/2016  . Alcohol abuse 04/14/2016  . CKD (chronic kidney disease), stage III   . PVD (peripheral vascular disease) ([place]) 03/07/2014  . Cognitive impairment 03/07/2014  . Essential hypertension, benign 02/02/2013  . Routine general medical examination at a health care facility 02/02/2013  . Need for prophylactic vaccination and inoculation against influenza 02/02/2013  . Hyperlipidemia LDL goal <130 02/02/2013  . Hypertensive renal disease 02/02/2013  . Seizures ([place]) 07/28/2012  . Tobacco use disorder 07/28/2012    Orientation RESPIRATION BLADDER Height & Weight     Self, Time, Situation, Place  Normal Continent Weight: 121 lb 11.1 oz (55.2 kg) Height:     BEHAVIORAL SYMPTOMS/MOOD NEUROLOGICAL BOWEL NUTRITION STATUS      Continent  (Please see discharge summary)  AMBULATORY STATUS COMMUNICATION OF NEEDS Skin   Extensive Assist Verbally Normal                       Personal Care Assistance Level of Assistance  Bathing, Feeding, Dressing Bathing Assistance: Maximum assistance Feeding  assistance: Independent Dressing Assistance: Maximum assistance     Functional Limitations Info  Sight, Hearing, Speech Sight Info: Adequate Hearing Info: Adequate Speech Info: Adequate    SPECIAL CARE FACTORS FREQUENCY  PT (By licensed PT), OT (By licensed OT)     PT Frequency: min 3x week OT Frequency:  min 3x week            Contractures Contractures Info: Not present    Additional Factors Info  Code Status, Allergies Code Status Info: Full Code Allergies Info: Penicillins           Current Medications (04/16/2016):  This is the current hospital active medication list Current Facility-Administered Medications  Medication Dose Route Frequency Provider Last Rate Last Dose  . 0.9 %  sodium chloride infusion  250 mL Intravenous PRN Ivor Costa, MD      . acetaminophen (TYLENOL) tablet 650 mg  650 mg Oral Q4H PRN Ivor Costa, MD      . aspirin tablet 325 mg  325 mg Oral Daily Ivor Costa, MD   325 mg at 04/16/16 0900  . atorvastatin (LIPITOR) tablet 10 mg  10 mg Oral q1800 Ivor Costa, MD   10 mg at 04/15/16 1742  . diltiazem (CARDIZEM CD) 24 hr capsule 120 mg  120 mg Oral Daily Daune Perch, NP   120 mg at 04/16/16 0900  . divalproex (DEPAKOTE) DR tablet 250 mg  250 mg Oral Q8H Ivor Costa, MD   250 mg at 04/16/16 0547  . folic acid (FOLVITE)  tablet 1 mg  1 mg Oral Daily Ivor Costa, MD   1 mg at 04/16/16 0900  . furosemide (LASIX) tablet 20 mg  20 mg Oral Daily Daune Perch, NP   20 mg at 04/16/16 0900  . gatifloxacin (ZYMAXID) 0.5 % ophthalmic drops 1 drop  1 drop Left Eye QID Ivor Costa, MD   1 drop at 04/16/16 0859  . heparin ADULT infusion 100 units/mL (25000 units/239mL sodium chloride 0.45%)  750 Units/hr Intravenous Continuous Jake Church Masters, RPH 7.5 mL/hr at 04/15/16 2322 750 Units/hr at 04/15/16 2322  . ketorolac (ACULAR) 0.5 % ophthalmic solution 1 drop  1 drop Left Eye QID Ivor Costa, MD   1 drop at 04/16/16 0859  . levalbuterol (XOPENEX) nebulizer solution 1.25 mg   1.25 mg Nebulization Q6H PRN Ivor Costa, MD      . levETIRAcetam (KEPPRA) tablet 500 mg  500 mg Oral BID Ivor Costa, MD   500 mg at 04/16/16 0900  . LORazepam (ATIVAN) tablet 1 mg  1 mg Oral Q6H PRN Ripudeep Krystal Eaton, MD       Or  . LORazepam (ATIVAN) injection 1 mg  1 mg Intravenous Q6H PRN Ripudeep K Rai, MD      . morphine 2 MG/ML injection 1 mg  1 mg Intravenous Q4H PRN Ripudeep K Rai, MD      . multivitamin with minerals tablet 1 tablet  1 tablet Oral Daily Ivor Costa, MD   1 tablet at 04/16/16 0859  . nitroGLYCERIN (NITROSTAT) SL tablet 0.4 mg  0.4 mg Sublingual Q5 min PRN Ivor Costa, MD      . pantoprazole (PROTONIX) EC tablet 40 mg  40 mg Oral Daily Daune Perch, NP   40 mg at 04/16/16 0900  . sodium chloride flush (NS) 0.9 % injection 3 mL  3 mL Intravenous Q12H Ivor Costa, MD   3 mL at 04/16/16 0900  . sodium chloride flush (NS) 0.9 % injection 3 mL  3 mL Intravenous PRN Ivor Costa, MD      . thiamine (VITAMIN B-1) tablet 100 mg  100 mg Oral Daily Ivor Costa, MD   100 mg at 04/16/16 0859  . zolpidem (AMBIEN) tablet 5 mg  5 mg Oral QHS PRN Ivor Costa, MD         Discharge Medications: Please see discharge summary for a list of discharge medications.  Relevant Imaging Results:  Relevant Lab Results:   Additional Information SSN: [id number]  Alla German, LCSW

## 2016-04-16 NOTE — Progress Notes (Signed)
Patient Name: [name] Date of Encounter: 04/16/2016  Primary Cardiologist: [facility] Problem List     Principal Problem:   Atrial fibrillation with RVR [facility]) Active Problems:   Seizures ([place])   Essential hypertension, benign   Hyperlipidemia LDL goal <130   CHF (congestive heart failure) (HCC)   Elevated troponin   CKD (chronic kidney disease), stage III   Fall   Alcohol abuse     Subjective   Patient is quite sleepy but denies chest pain, shortness of breath or lightheadedness. He feels "a whole lot better".  Inpatient Medications    Scheduled Meds: . aspirin  325 mg Oral Daily  . atorvastatin  10 mg Oral q1800  . Difluprednate  1 drop Left Eye Daily  . diltiazem  30 mg Oral Q6H  . divalproex  250 mg Oral X4I  . folic acid  1 mg Oral Daily  . furosemide  20 mg Oral Daily  . gatifloxacin  1 drop Left Eye QID  . ketorolac  1 drop Left Eye QID  . levETIRAcetam  500 mg Oral BID  . LORazepam  0-4 mg Intravenous Q6H   Followed by  . [START ON 04/17/2016] LORazepam  0-4 mg Intravenous Q12H  . multivitamin with minerals  1 tablet Oral Daily  . pantoprazole  40 mg Oral Daily  . sodium chloride flush  3 mL Intravenous Q12H  . thiamine  100 mg Oral Daily   Or  . thiamine  100 mg Intravenous Daily   Continuous Infusions: . heparin 750 Units/hr (04/15/16 2322)   PRN Meds: sodium chloride, acetaminophen, levalbuterol, LORazepam **OR** LORazepam, morphine injection, nitroGLYCERIN, sodium chloride flush, zolpidem   Vital Signs    Vitals:   04/15/16 2329 04/16/16 0018 04/16/16 0440 04/16/16 0547  BP: (!) 160/81 (!) 142/72 (!) 162/76 (!) 160/71  Pulse:  74 81   Resp:  14 15   Temp:  98.2 F (36.8 C) 98.1 F (36.7 C)   TempSrc:  Oral Oral   SpO2:      Weight:   121 lb 11.1 oz (55.2 kg)     Intake/Output Summary (Last 24 hours) at 04/16/16 0759 Last data filed at 04/16/16 0646  Gross per 24 hour  Intake             1665 ml  Output               900 ml  Net              765 ml   Filed Weights   04/14/16 2303 04/15/16 0500 04/16/16 0440  Weight: 121 lb 7.6 oz (55.1 kg) 121 lb 0.5 oz (54.9 kg) 121 lb 11.1 oz (55.2 kg)    Physical Exam    GEN: Thin AA male, in no acute distress.  HEENT: Grossly normal. Small indentation in skull  Neck: Supple, no JVD, carotid bruits, or masses. Cardiac: RRR, no rubs, or gallops. 3/6 systolic murmur best appreciated in the left sternal border region. No clubbing, cyanosis, edema.  Radials/DP/PT 2+ and equal bilaterally.  Respiratory:  Respirations regular and unlabored, clear to auscultation bilaterally. GI: Soft, nontender, nondistended, BS + x 4. MS: no deformity or atrophy. Skin: warm and dry, no rash. Neuro:  Strength and sensation are intact. Psych: AAOx3.    Labs    CBC  Recent Labs  04/14/16 1612 04/15/16 0912  WBC 7.7 6.7  HGB 13.6 12.2*  HCT 38.9* 36.4*  MCV  91.5 92.2  PLT 134* 967*   Basic Metabolic Panel  Recent Labs  04/14/16 1612 04/15/16 0912  NA 140 141  K 3.8 3.6  CL 103 102  CO2 26 31  GLUCOSE 156* 98  BUN 15 11  CREATININE 1.41* 1.25*  CALCIUM 9.3 9.1  MG 1.9  --    Liver Function Tests No results for input(s): AST, ALT, ALKPHOS, BILITOT, PROT, ALBUMIN in the last 72 hours. No results for input(s): LIPASE, AMYLASE in the last 72 hours. Cardiac Enzymes  Recent Labs  04/14/16 2100 04/15/16 0211 04/15/16 0912  TROPONINI 0.35* 0.46* 0.35*   BNP Invalid input(s): POCBNP D-Dimer No results for input(s): DDIMER in the last 72 hours. Hemoglobin A1C  Recent Labs  04/15/16 0211  HGBA1C 5.2   Fasting Lipid Panel  Recent Labs  04/15/16 0211  CHOL 164  HDL 103  LDLCALC 51  TRIG 48  CHOLHDL 1.6   Thyroid Function Tests  Recent Labs  04/14/16 1612  TSH 1.992    Telemetry    Sinus rhythm in the 70's-80's, Converted to SR at 1354 on 1/9 after conversion pause of 2 seconds, no further pauses. Had one 3 beat run of NSVT  - Personally  Reviewed  ECG   No new tracings  Radiology    Ct Head Wo Contrast  Result Date: 04/14/2016 CLINICAL DATA:  Initial evaluation for acute fall. EXAM: CT HEAD WITHOUT CONTRAST TECHNIQUE: Contiguous axial images were obtained from the base of the skull through the vertex without intravenous contrast. COMPARISON:  Prior CT from 04/25/2012. FINDINGS: Brain: Diffuse prominence of the CSF containing spaces is compatible with generalized cerebral atrophy. Patchy and confluent hypodensity within the periventricular and deep white matter both cerebral hemispheres most consistent with chronic microvascular ischemic disease. Encephalomalacia out within the high right frontal lobe is stable. Additional focal encephalomalacia within the high left frontal lobe also stable. Chronic encephalomalacia present at the anterior left temporal pole. Aneurysm clip present at the level of the left ICA terminus. No acute intracranial hemorrhage. No evidence for acute large vessel territory infarct. No mass lesion, midline shift or mass effect. Ventricular prominence related underlying atrophy without hydrocephalus. No extra-axial fluid collection. Vascular: No hyperdense vessel. Aneurysm clip at the left ICA terminus. Intracranial atherosclerosis noted. A a a Skull: Scalp soft tissues demonstrate no acute abnormality. Post craniotomy changes present at the left frontal calvarium. Remote craniotomy/ burr hole at the right frontal calvarium. No acute skull fracture or other abnormality. Sinuses/Orbits: Globes and orbital soft tissues within normal limits. Patient is status post lens extraction on the left. Paranasal sinuses are clear. No mastoid effusion. IMPRESSION: 1. No acute intracranial process identified. 2. Postsurgical and chronic changes as above, stable. 3. Underlying cerebral atrophy with chronic microvascular ischemic disease, also stable. Electronically Signed   By: Jeannine Boga M.D.   On: 04/14/2016 21:09   Dg  Chest Portable 1 View  Result Date: 04/14/2016 CLINICAL DATA:  Generalized weakness. History of seizure activity, COPD, former smoker. EXAM: PORTABLE CHEST 1 VIEW COMPARISON:  Chest x-ray of April 28, 2012 FINDINGS: The lungs are well-expanded and clear. The heart and pulmonary vascularity are normal. The mediastinum is normal in width. There is calcification in the wall of the aortic arch. There is no pleural effusion. The bony thorax exhibits no acute abnormality. IMPRESSION: COPD.  There is no active cardiopulmonary disease. Electronically Signed   By: David  Martinique M.D.   On: 04/14/2016 15:10    Cardiac Studies  Echo 04/15/2016 Study Conclusions  - Left ventricle: The cavity size was normal. There was moderate   concentric hypertrophy. Systolic function was normal. The   estimated ejection fraction was in the range of 60% to 65%. Wall   motion was normal; there were no regional wall motion   abnormalities. Features are consistent with a pseudonormal left   ventricular filling pattern, with concomitant abnormal relaxation   and increased filling pressure (grade 2 diastolic dysfunction).   Doppler parameters are consistent with high ventricular filling   pressure. - Aortic valve: Trileaflet; normal thickness, mildly calcified   leaflets. - Mitral valve: Severely calcified annulus. There was mild   regurgitation. - Left atrium: The atrium was moderately dilated. - Pulmonic valve: There was trivial regurgitation.  Patient Profile     76 yo male with PMH of COPD, HTN, HLD, Seizures, tobacco use, ETOH use, brain aneurysm who presented with increasing lower extremity edema, and falls.  Assessment & Plan    1. Atrial fibrillation with rapid ventricular response -Reported progressive weakness and lower extremity edema over past couple of weeks. Unaware of any palpitations. He has had frequent falls and is a daily alcohol user sometimes to the point of passing out. EKG on arrival showed  atrial fibrillation with rapid ventricular response rate in the 170s. Started on IV Cardizem with improvement in afib rate and subsequent conversion to sinus rhythm at 1354 on 1/10 in the 70's. -Atrial fibrillation was of unknown duration -IV heparin initiated for stroke risk reduction related to atrial fibrillation. CHA2DS2-VASc score is 4 ( HTN, AGE (2), Vascular disease). Pt is not a good candidate for anticoagulation due to his daily heavy alcohol use, frequent falls and history of seizures. Full dose aspirin started. PPI added. -LV systolic is normal with EF 60-65%. Cardizem switched to oral dosing with 30 mg q 6h and pt is maintaining sinus rhythm in the 70's- 80's. Will switch to long acting form.  -Pt is quite sleepy- is on Ativan for CIWA protocol. Once he is more alert and up moving can possibly be discharged. -Will arrange follow up at our office   2. Heart failure symptoms -Elevated BNP of 1172, 2+ pitting lower extremity edema -Baseline weight has been 122-125 pounds over the last year per office notes. Weight on admission was 121 pounds, on 1/10-121 lbs -Chest x-ray showed COPD and no acute cardiopulmonary disease -Symptoms concerning for heart failure. Echocardiogram showed normal LV systolic function and grade 2 diastolic dysfunction -Patient has risk factor for heart failure including daily alcohol use, hypertension, CRI -Received lasix 40 mg IV  with good urine output and resolution of edema - IV Lasix transitioned to oral at 20 mg daily, no edema or shortness of breath  3. Elevated troponin -Troponins mildly elevated 0.16 (POC), 0.35, 0.46, 0.35. No chest pain. This is likely related to demand ischemia insetting of rapid atrial fibrillation. - Echocardiogram shows normal LV systolic function and normal wall motion. -Ischemia may be an underlying cause of atrial fibrillation, however, could be related to heavy alcohol use and/or chronic renal insufficiency.  -May consider  further testing to include stress test as an outpatient  4. Hypertension -Blood pressures have been running systolic BP [id number] -Home Amlodipine was discontinued when cardizem initiated. -Assess response to cardizem and can address at follow up, will likely needed further BP control  5. Chronic renal insufficiency, Stage III -Baseline creatinine in the 1.3-1.6 range. Creatinine on admission 04/14/16-1.41, 1/10-1.25  6. Hyperlipidemia -Lipid panel, 04/15/16, reveals  total cholesterol 164, HDL 103, LDL 51- is to goal of <100 -Continue statin  7. Seizures -No recent seizure activity, patient maintained on Depakote -Recent falls per sister, do not sound like seizures -Neurology consulted for EEG which was normal but does not exclude a clinical diagnosis of epilepsy   Signed, Daune Perch, NP  04/16/2016, 7:59 AM  Pager: [phone]  History and all data above reviewed.  Patient examined.  I agree with the findings as above. No pain.  No SOB. Pleasantly confused.   The patient exam reveals COR:RRR  ,  Lungs: Clear  ,  Abd: Positive bowel sounds, no rebound no guarding, Ext No edema  .  All available labs, radiology testing, previous records reviewed. Agree with documented assessment and plan. Atrial fib:  Now NSR.  No anticoagulation secondary to risk.  Elevated troponin secondary.  No plan for ischemia work up at this point.   Jeneen Rinks Kanoa Phillippi  11:53 AM  04/16/2016

## 2016-04-16 NOTE — Care Management Note (Signed)
Case Management Note  Patient Details  Name: TRYTON BODI MRN: [id number] Date of Birth: [birth year]  Subjective/Objective: Pt presented for Atrial Fib. Pt with increased weakness. CSW following for SNF placement.                     Action/Plan: CM will continue to monitor for additional needs.   Expected Discharge Date:                  Expected Discharge Plan:  Skilled Nursing Facility  In-House Referral:  Clinical Social Work  Discharge planning Services  CM Consult  Post Acute Care Choice:  NA Choice offered to:  NA  DME Arranged:  N/A DME Agency:  NA  HH Arranged:  NA HH Agency:  NA  Status of Service:  Completed, signed off  If discussed at [employer] of Stay Meetings, dates discussed:    Additional Comments:  Bethena Roys, RN 04/16/2016, 4:11 PM

## 2016-04-16 NOTE — Progress Notes (Signed)
[facility]                                                                              Patient Demographics  [name], is a 76 y.o. male, DOB - 16-Mar-1941, ZJI:[id number]  Admit date - 04/14/2016   Admitting Physician Ivor Costa, MD  Outpatient Primary MD for the patient is [name], NP  Outpatient specialists:   LOS - 2  days    Chief Complaint  Patient presents with  . Tachycardia       Brief summary   Patient is a 76 year old male with COPD, HTN, HLD, seizures, former smoker, alcohol abuse, s/p of brain aneurysm repair who presented weakness weakness and fall. Per patient's sister, he was noted to have increasing lower extremity edema. In ED, patient was noted have rapid atrial fibrillation with heart rate in 170s. He was started on IV Cardizem. BNP 1172 Troponin 0.16. A chest x-ray showed COPD but no acute findings. Patient was admitted to stepdown unit. He was started on heparin drip.   Assessment & Plan    Principal Problem:  Atrial fibrillation with RVR [facility]):  not sure about the exact onset time. Pt has ongoing weakness over the past couple weeks, indicating onset >48 hours. This is likely triggered by alcohol abuse and possible CHF given his elevated BNP, JVD and 2+ pitting lower extremity edema.  - Patient was initially started on IV Cardizem drip, now positioned to oral Cardizem 120 mg daily  - Converted to normal sinus rhythm  - Cardiology was consulted, recommended no ischemia workup at this time  - Currently on IV heparin drip however no plans for anticoagulation, patient has heavy alcohol abuse, sometimes to the point that he passes out, per his sister.  - will DC on ASA - 2-D echo showed EF of 6065% with grade 2 diastolic dysfunction  HTN: -Continue Cardizem  HLD: -Continue statin, lipitor  Seizures:Reports he has not had a seizure in many years.  -seizure precaution -continue Depakote and Keppra  Fall:  - CT-head  negative for acute intracranial abnormalities but showed stable atrophy. Sister reports that he has been having falling episodes, which don't sound like seizures. Has not seen a neurologist in many years.  - EEG normal  Elevated Trop: - Elevated troponins likely due to demand ischemia - Follow 2-D echo, cardiology consulted  CKD (chronic kidney disease), stage III: Stable. Baseline creatinine 1.2-1.4. His Cre is 1.41 on admission. -Creatinine improving, at baseline  Alcohol abuse: - will DC scheduled Ativan, continue CIWA with PRN ativan    Code Status: Full CODE STATUS  DVT Prophylaxis:Heparin  Family Communication: Discussed in detail with the patient, all imaging results, lab results explained to the patient.   Disposition Plan: PT recommended skilled nursing severity, social worker consult placed  Time Spent in minutes 25 minutes  Procedures:    Consultants:   Cardiology  Antimicrobials:      Medications  Scheduled Meds: . aspirin  325 mg Oral Daily  . atorvastatin  10 mg Oral q1800  . diltiazem  120 mg Oral Daily  . divalproex  250 mg Oral Q8H  .  folic acid  1 mg Oral Daily  . furosemide  20 mg Oral Daily  . gatifloxacin  1 drop Left Eye QID  . ketorolac  1 drop Left Eye QID  . levETIRAcetam  500 mg Oral BID  . multivitamin with minerals  1 tablet Oral Daily  . pantoprazole  40 mg Oral Daily  . sodium chloride flush  3 mL Intravenous Q12H  . thiamine  100 mg Oral Daily   Continuous Infusions: . heparin 750 Units/hr (04/15/16 2322)   PRN Meds:.sodium chloride, acetaminophen, levalbuterol, LORazepam **OR** LORazepam, morphine injection, nitroGLYCERIN, sodium chloride flush, zolpidem   Antibiotics   Anti-infectives    None        Subjective:   [name] was seen and examined today.Somewhat pleasantly confused and denies any complaints.   Patient denies dizziness, chest pain, shortness of breath, abdominal pain, N/V/D/C, new weakness, numbess,  tingling.   Objective:   Vitals:   04/16/16 0018 04/16/16 0440 04/16/16 0547 04/16/16 0818  BP: (!) 142/72 (!) 162/76 (!) 160/71   Pulse: 74 81  82  Resp: 14 15  17   Temp: 98.2 F (36.8 C) 98.1 F (36.7 C)  98.3 F (36.8 C)  TempSrc: Oral Oral  Oral  SpO2:    100%  Weight:  55.2 kg (121 lb 11.1 oz)      Intake/Output Summary (Last 24 hours) at 04/16/16 1342 Last data filed at 04/16/16 0900  Gross per 24 hour  Intake             1125 ml  Output              900 ml  Net              225 ml     Wt Readings from Last 3 Encounters:  04/16/16 55.2 kg (121 lb 11.1 oz)  01/24/16 56.1 kg (123 lb 10.1 oz)  12/13/15 56.7 kg (125 lb)     Exam  General: Alert and oriented x 2, NAD  HEENT:    Neck:   Cardiovascular: S1 S2,RRR  Respiratory:   CTAB  Gastrointestinal: Soft, nontender, nondistended, + bowel sounds  Ext: no cyanosis clubbing or edema  Neuro:no new deficits   Skin: No rashes  Psych: Somewhat confused, alert and oriented x2    Data Reviewed:  I have personally reviewed following labs and imaging studies  Micro Results No results found for this or any previous visit (from the past 240 hour(s)).  Radiology Reports Ct Head Wo Contrast  Result Date: 04/14/2016 CLINICAL DATA:  Initial evaluation for acute fall. EXAM: CT HEAD WITHOUT CONTRAST TECHNIQUE: Contiguous axial images were obtained from the base of the skull through the vertex without intravenous contrast. COMPARISON:  Prior CT from 04/25/2012. FINDINGS: Brain: Diffuse prominence of the CSF containing spaces is compatible with generalized cerebral atrophy. Patchy and confluent hypodensity within the periventricular and deep white matter both cerebral hemispheres most consistent with chronic microvascular ischemic disease. Encephalomalacia out within the high right frontal lobe is stable. Additional focal encephalomalacia within the high left frontal lobe also stable. Chronic encephalomalacia present at  the anterior left temporal pole. Aneurysm clip present at the level of the left ICA terminus. No acute intracranial hemorrhage. No evidence for acute large vessel territory infarct. No mass lesion, midline shift or mass effect. Ventricular prominence related underlying atrophy without hydrocephalus. No extra-axial fluid collection. Vascular: No hyperdense vessel. Aneurysm clip at the left ICA terminus. Intracranial atherosclerosis noted. A a  a Skull: Scalp soft tissues demonstrate no acute abnormality. Post craniotomy changes present at the left frontal calvarium. Remote craniotomy/ burr hole at the right frontal calvarium. No acute skull fracture or other abnormality. Sinuses/Orbits: Globes and orbital soft tissues within normal limits. Patient is status post lens extraction on the left. Paranasal sinuses are clear. No mastoid effusion. IMPRESSION: 1. No acute intracranial process identified. 2. Postsurgical and chronic changes as above, stable. 3. Underlying cerebral atrophy with chronic microvascular ischemic disease, also stable. Electronically Signed   By: Jeannine Boga M.D.   On: 04/14/2016 21:09   Dg Chest Portable 1 View  Result Date: 04/14/2016 CLINICAL DATA:  Generalized weakness. History of seizure activity, COPD, former smoker. EXAM: PORTABLE CHEST 1 VIEW COMPARISON:  Chest x-ray of April 28, 2012 FINDINGS: The lungs are well-expanded and clear. The heart and pulmonary vascularity are normal. The mediastinum is normal in width. There is calcification in the wall of the aortic arch. There is no pleural effusion. The bony thorax exhibits no acute abnormality. IMPRESSION: COPD.  There is no active cardiopulmonary disease. Electronically Signed   By: David  Martinique M.D.   On: 04/14/2016 15:10    Lab Data:  CBC:  Recent Labs Lab 04/14/16 1612 04/15/16 0912 04/16/16 0658  WBC 7.7 6.7 7.4  HGB 13.6 12.2* 12.3*  HCT 38.9* 36.4* 36.6*  MCV 91.5 92.2 93.4  PLT 134* 122* 120*   Basic  Metabolic Panel:  Recent Labs Lab 04/14/16 1612 04/15/16 0912 04/16/16 0658  NA 140 141 139  K 3.8 3.6 4.3  CL 103 102 103  CO2 26 31 29   GLUCOSE 156* 98 85  BUN 15 11 9   CREATININE 1.41* 1.25* 1.21  CALCIUM 9.3 9.1 9.0  MG 1.9  --   --    GFR: Estimated Creatinine Clearance: 39.9 mL/min (by C-G formula based on SCr of 1.21 mg/dL). Liver Function Tests: No results for input(s): AST, ALT, ALKPHOS, BILITOT, PROT, ALBUMIN in the last 168 hours. No results for input(s): LIPASE, AMYLASE in the last 168 hours. No results for input(s): AMMONIA in the last 168 hours. Coagulation Profile:  Recent Labs Lab 04/14/16 1612  INR 0.91   Cardiac Enzymes:  Recent Labs Lab 04/14/16 2100 04/15/16 0211 04/15/16 0912  TROPONINI 0.35* 0.46* 0.35*   BNP (last 3 results) No results for input(s): PROBNP in the last 8760 hours. HbA1C:  Recent Labs  04/15/16 0211  HGBA1C 5.2   CBG: No results for input(s): GLUCAP in the last 168 hours. Lipid Profile:  Recent Labs  04/15/16 0211  CHOL 164  HDL 103  LDLCALC 51  TRIG 48  CHOLHDL 1.6   Thyroid Function Tests:  Recent Labs  04/14/16 1612 04/15/16 0912  TSH 1.992  --   FREET4  --  1.36*   Anemia Panel: No results for input(s): VITAMINB12, FOLATE, FERRITIN, TIBC, IRON, RETICCTPCT in the last 72 hours. Urine analysis: No results found for: COLORURINE, APPEARANCEUR, LABSPEC, PHURINE, GLUCOSEU, HGBUR, BILIRUBINUR, KETONESUR, PROTEINUR, UROBILINOGEN, NITRITE, Corliss Skains M.D. [facility] 04/16/2016, 1:42 PM  Pager: [phone] Between 7am to 7pm - call Pager - [phone]  After 7pm go to [email or web address] - password [email or web address]  Call night coverage person covering after 7pm

## 2016-04-16 NOTE — Clinical Social Work Note (Signed)
Pt PASRR number is: [id number] A.  [address] [place]

## 2016-04-16 NOTE — Progress Notes (Signed)
Occupational Therapy Treatment Patient Details Name: [name] MRN: [id number] DOB: 10/05/1940 Today's Date: 04/16/2016    History of present illness 76 year old male with COPD, HTN, HLD, seizures, former smoker, alcohol abuse, s/p of brain aneurysm repair who presented weakness weakness and fall.   OT comments  Pt able to perform functional mobility in room with mod assist and max cues for sequencing and safety with RW. Pt required min assist for grooming tasks standing at the sink. Pt continues to demonstrate impulsivity, decreased safety awareness, and poor insight into his current deficits. Updated d/c plan to SNF for follow up. Will continue to follow acutely.   Follow Up Recommendations  SNF;Supervision/Assistance - 24 hour    Equipment Recommendations  None recommended by OT    Recommendations for Other Services      Precautions / Restrictions Precautions Precautions: Fall Restrictions Weight Bearing Restrictions: No       Mobility Bed Mobility Overal bed mobility: Needs Assistance Bed Mobility: Supine to Sit;Sit to Supine     Supine to sit: Mod assist Sit to supine: Min assist   General bed mobility comments: Assist for LLE to EOB and into bed. Hand held assist for trunk elevation to sitting. Cues for sequencing and technique, increased time required.  Transfers Overall transfer level: Needs assistance Equipment used: Rolling walker (2 wheeled) Transfers: Sit to/from Stand Sit to Stand: Min assist         General transfer comment: Min assist for standing balance with max verbal cues for sequencing and technique.    Balance Overall balance assessment: Needs assistance Sitting-balance support: Feet supported;No upper extremity supported Sitting balance-Leahy Scale: Fair     Standing balance support: No upper extremity supported;During functional activity Standing balance-Leahy Scale: Poor Standing balance comment: Min assist for standing balance                    ADL Overall ADL's : Needs assistance/impaired     Grooming: Minimal assistance;Wash/dry face;Oral care;Standing Grooming Details (indicate cue type and reason): Assist for standing balance and applying toothpaste to toothbrush.                 Toilet Transfer: Moderate assistance;Ambulation;RW;Cueing for sequencing;Cueing for safety Toilet Transfer Details (indicate cue type and reason): Simulated by sit to stand from EOB with functional mobility. Cues for safety and sequencing.          Functional mobility during ADLs: Moderate assistance;Rolling walker;Cueing for safety;Cueing for sequencing General ADL Comments: Pt with decreased safety awareness and insight into deficits; feels like he can go home in his current condition. Sister who he lives with is disabled? pt reports he has to help her "with a lot of things"      Vision                     Perception     Praxis      Cognition   Behavior During Therapy: WFL for tasks assessed/performed Overall Cognitive Status: No family/caregiver present to determine baseline cognitive functioning                  General Comments: Impulsive with decreased awareness of deficits.    Extremity/Trunk Assessment               Exercises     Shoulder Instructions       General Comments      Pertinent Vitals/ Pain       Pain Assessment: Faces  Faces Pain Scale: Hurts even more Pain Location: L thigh/hip Pain Descriptors / Indicators: Grimacing;Guarding;Tightness;Sore Pain Intervention(s): Monitored during session  Home Living                                          Prior Functioning/Environment              Frequency  Min 2X/week        Progress Toward Goals  OT Goals(current goals can now be found in the care plan section)  Progress towards OT goals: Progressing toward goals  Acute Rehab OT Goals Patient Stated Goal: to go home OT Goal  Formulation: With patient ADL Goals Pt Will Perform Grooming: with supervision;standing  Plan Discharge plan needs to be updated    Co-evaluation                 End of Session Equipment Utilized During Treatment: Gait belt;Rolling walker   Activity Tolerance Patient tolerated treatment well   Patient Left in bed;with call bell/phone within reach;with bed alarm set   Nurse Communication          Time: 2575-0518 OT Time Calculation (min): 25 min  Charges: OT General Charges $OT Visit: 1 Procedure OT Treatments $Self Care/Home Management : 23-37 mins  Binnie Kand M.S., OTR/L Pager: [phone]  04/16/2016, 4:05 PM

## 2016-04-16 NOTE — Progress Notes (Signed)
ANTICOAGULATION CONSULT NOTE - Follow Up Consult  Pharmacy Consult for Heparin Indication: atrial fibrillation  Allergies  Allergen Reactions  . Penicillins Other (See Comments)    Patient Measurements: Weight: 121 lb 11.1 oz (55.2 kg) Heparin Dosing Weight: 55 kg  Vital Signs: Temp: 98.3 F (36.8 C) (01/10 0818) Temp Source: Oral (01/10 0818) BP: 160/71 (01/10 0547) Pulse Rate: 82 (01/10 0818)  Labs:  Recent Labs  04/14/16 1612 04/14/16 2100 04/15/16 0211 04/15/16 0912 04/16/16 0658  HGB 13.6  --   --  12.2* 12.3*  HCT 38.9*  --   --  36.4* 36.6*  PLT 134*  --   --  122* 120*  LABPROT 12.3  --   --   --   --   INR 0.91  --   --   --   --   HEPARINUNFRC  --   --  0.56 0.41  --   CREATININE 1.41*  --   --  1.25*  --   TROPONINI  --  0.35* 0.46* 0.35*  --     Estimated Creatinine Clearance: 38.6 mL/min (by C-G formula based on SCr of 1.25 mg/dL (H)).   Medications:  Scheduled:  . aspirin  325 mg Oral Daily  . atorvastatin  10 mg Oral q1800  . Difluprednate  1 drop Left Eye Daily  . diltiazem  120 mg Oral Daily  . divalproex  250 mg Oral W9N  . folic acid  1 mg Oral Daily  . furosemide  20 mg Oral Daily  . gatifloxacin  1 drop Left Eye QID  . ketorolac  1 drop Left Eye QID  . levETIRAcetam  500 mg Oral BID  . multivitamin with minerals  1 tablet Oral Daily  . pantoprazole  40 mg Oral Daily  . sodium chloride flush  3 mL Intravenous Q12H  . thiamine  100 mg Oral Daily   Or  . thiamine  100 mg Intravenous Daily   Infusions:  . heparin 750 Units/hr (04/15/16 2322)    Assessment: 76 yo M presented to ED with weakness and increased LE edema.  Found to have afib with RVR and started on heparin infusion.  Heparin level is therapeutic on 750 units/hr.  CBC stable. No bleeding noted. Noted not a good candidate for long-term anticoagulation per Cards note - may continue asa only.  Goal of Therapy:  Heparin level 0.3-0.7 units/ml Monitor platelets by  anticoagulation protocol: Yes   Plan:  Continue heparin at 750 units/hr Daily heparin level and CBC Monitor for s/sx bleeding F/u plans for long-term anticoagulation   Elicia Lamp, PharmD, BCPS Clinical Pharmacist 04/16/2016 8:47 AM

## 2016-04-17 ENCOUNTER — Inpatient Hospital Stay (HOSPITAL_COMMUNITY): Payer: Medicare Other

## 2016-04-17 LAB — CBC
HEMATOCRIT: 29.1 % — AB (ref 39.0–52.0)
HEMOGLOBIN: 9.6 g/dL — AB (ref 13.0–17.0)
MCH: 30.6 pg (ref 26.0–34.0)
MCHC: 33 g/dL (ref 30.0–36.0)
MCV: 92.7 fL (ref 78.0–100.0)
Platelets: 130 10*3/uL — ABNORMAL LOW (ref 150–400)
RBC: 3.14 MIL/uL — AB (ref 4.22–5.81)
RDW: 14 % (ref 11.5–15.5)
WBC: 6.2 10*3/uL (ref 4.0–10.5)

## 2016-04-17 LAB — BASIC METABOLIC PANEL
ANION GAP: 9 (ref 5–15)
BUN: 15 mg/dL (ref 6–20)
CALCIUM: 8.5 mg/dL — AB (ref 8.9–10.3)
CHLORIDE: 102 mmol/L (ref 101–111)
CO2: 27 mmol/L (ref 22–32)
Creatinine, Ser: 1.72 mg/dL — ABNORMAL HIGH (ref 0.61–1.24)
GFR calc non Af Amer: 37 mL/min — ABNORMAL LOW (ref >60)
GFR, EST AFRICAN AMERICAN: 42 mL/min — AB (ref >60)
Glucose, Bld: 127 mg/dL — ABNORMAL HIGH (ref 65–99)
POTASSIUM: 4.3 mmol/L (ref 3.5–5.1)
Sodium: 138 mmol/L (ref 135–145)

## 2016-04-17 LAB — HEPARIN LEVEL (UNFRACTIONATED): HEPARIN UNFRACTIONATED: 0.58 [IU]/mL (ref 0.30–0.70)

## 2016-04-17 MED ORDER — DILTIAZEM HCL 100 MG IV SOLR
5.0000 mg/h | INTRAVENOUS | Status: DC
  Filled 2016-04-17: qty 100

## 2016-04-17 MED ORDER — DILTIAZEM HCL ER COATED BEADS 120 MG PO CP24: 120.0000 mg | ORAL_CAPSULE | Freq: Every day | ORAL | Status: DC

## 2016-04-17 MED ORDER — DILTIAZEM HCL ER COATED BEADS 180 MG PO CP24
180.0000 mg | ORAL_CAPSULE | Freq: Every day | ORAL | Status: DC
  Administered 2016-04-17 – 2016-04-19 (×3): 180 mg via ORAL
  Filled 2016-04-17 (×3): qty 1

## 2016-04-17 MED ORDER — METOPROLOL TARTRATE 5 MG/5ML IV SOLN
INTRAVENOUS | Status: AC
  Administered 2016-04-17: 5 mg
  Filled 2016-04-17: qty 5

## 2016-04-17 NOTE — Progress Notes (Signed)
While administering 0600 meds, this RN observed the patient convert from NSR to and irregular rhythm with a HR in the 160's that was sustaining. Pt A&O, denies CP, VSS. EKG confirmed A. Flutter with a HR of 166. On-call for Cardiology paged. Verbal order received for 5mg  of  IV lopressor. NP, Tamala Julian at bedside shortly after. Order received to restart cardizem gtt after IV push metoprolol was unsuccessful.  Approx 15 mins later, pt spontaneously converted back to NSR with a HR sustaining in the 70's. Order for cardizem gtt restart discontinued. Pt resting comfortably now. Will continue to monitor.   Barbie Haggis, RN.

## 2016-04-17 NOTE — Progress Notes (Signed)
PT Cancellation Note  Patient Details Name: BRIGHAM COBBINS MRN: [id number] DOB: 12-Mar-1941   Cancelled Treatment:    Reason Eval/Treat Not Completed: Medical issues which prohibited therapy. Spoke with nursing who reports pt with new large LE hematoma and requested to hold PT today.  Will check back tomorrow.   Shelvie Salsberry LUBECK 04/17/2016, 1:25 PM

## 2016-04-17 NOTE — Progress Notes (Addendum)
Patient Name: [name] Date of Encounter: 04/17/2016  Primary Cardiologist: Dr Radford Pax (this admit)  Hospital Problem List     Principal Problem:   Atrial fibrillation with RVR [facility]) Active Problems:   Seizures ([place])   Essential hypertension, benign   Hyperlipidemia LDL goal <130   Chronic diastolic heart failure ([facility])   Elevated troponin   CKD (chronic kidney disease), stage III   Fall   Alcohol abuse     Subjective   Patient is more alert this morning. [name] denies chest pain or shortness for breath. [name] has a hematoma on his right thigh that has an ice pack on. Pt denies any pain there.  Inpatient Medications    Scheduled Meds: . atorvastatin  10 mg Oral q1800  . diltiazem  180 mg Oral Daily  . divalproex  250 mg Oral T6A  . folic acid  1 mg Oral Daily  . furosemide  20 mg Oral Daily  . gatifloxacin  1 drop Left Eye QID  . ketorolac  1 drop Left Eye QID  . levETIRAcetam  500 mg Oral BID  . multivitamin with minerals  1 tablet Oral Daily  . nicotine  21 mg Transdermal Daily  . pantoprazole  40 mg Oral Daily  . sodium chloride flush  3 mL Intravenous Q12H  . thiamine  100 mg Oral Daily   Continuous Infusions:  PRN Meds: sodium chloride, acetaminophen, levalbuterol, LORazepam **OR** LORazepam, morphine injection, nitroGLYCERIN, sodium chloride flush, zolpidem   Vital Signs    Vitals:   04/17/16 0334 04/17/16 0620 04/17/16 0753 04/17/16 0755  BP: (!) 148/65 123/77    Pulse: 96 (!) 166 85 79  Resp: 19 15 15    Temp: 98.2 F (36.8 C)     TempSrc: Other (Comment)     SpO2: 97% 98% 100%   Weight: 120 lb 9.6 oz (54.7 kg)       Intake/Output Summary (Last 24 hours) at 04/17/16 0857 Last data filed at 04/17/16 0343  Gross per 24 hour  Intake              600 ml  Output              425 ml  Net              175 ml   Filed Weights   04/15/16 0500 04/16/16 0440 04/17/16 0334  Weight: 121 lb 0.5 oz (54.9 kg) 121 lb 11.1 oz (55.2 kg) 120 lb 9.6 oz (54.7 kg)      Physical Exam   GEN: Thin AA male, in no acute distress.  HEENT: Grossly normal.  Neck: Supple, no JVD Cardiac: RRR,  rubs, or gallops. 3/6 harsh systolic murmur best appreciated in left axillary region. No clubbing, cyanosis, edema.  Radials/DP/PT 1+ and equal bilaterally.  Respiratory:  Respirations regular and unlabored, clear to auscultation bilaterally. GI: Soft, nontender, nondistended, BS + x 4. MS: no deformity or atrophy. Skin: warm and dry, no rash. Right thigh hematoma- firm, marked Neuro:  Strength and sensation are intact. Psych: AAOx3.  Normal affect.  Labs    CBC  Recent Labs  04/16/16 0658 04/17/16 0531  WBC 7.4 6.2  HGB 12.3* 9.6*  HCT 36.6* 29.1*  MCV 93.4 92.7  PLT 120* 263*   Basic Metabolic Panel  Recent Labs  04/14/16 1612  04/16/16 0658 04/17/16 0531  NA 140  < > 139 138  K 3.8  < > 4.3 4.3  CL 103  < >  103 102  CO2 26  < > 29 27  GLUCOSE 156*  < > 85 127*  BUN 15  < > 9 15  CREATININE 1.41*  < > 1.21 1.72*  CALCIUM 9.3  < > 9.0 8.5*  MG 1.9  --   --   --   < > = values in this interval not displayed. Liver Function Tests No results for input(s): AST, ALT, ALKPHOS, BILITOT, PROT, ALBUMIN in the last 72 hours. No results for input(s): LIPASE, AMYLASE in the last 72 hours. Cardiac Enzymes  Recent Labs  04/14/16 2100 04/15/16 0211 04/15/16 0912  TROPONINI 0.35* 0.46* 0.35*   BNP Invalid input(s): POCBNP D-Dimer No results for input(s): DDIMER in the last 72 hours. Hemoglobin A1C  Recent Labs  04/15/16 0211  HGBA1C 5.2   Fasting Lipid Panel  Recent Labs  04/15/16 0211  CHOL 164  HDL 103  LDLCALC 51  TRIG 48  CHOLHDL 1.6   Thyroid Function Tests  Recent Labs  04/14/16 1612  TSH 1.992    Telemetry    Sinus rhythm in the 80's, afib with RVR 5625-6389 this morning. - Personally Reviewed  ECG    No new tracing.  Radiology    No results found.  Cardiac Studies   Echo 04/15/2016 Study Conclusions -  Left ventricle: The cavity size was normal. There was moderate concentric hypertrophy. Systolic function was normal. The estimated ejection fraction was in the range of 60% to 65%. Wall motion was normal; there were no regional wall motion abnormalities. Features are consistent with a pseudonormal left ventricular filling pattern, with concomitant abnormal relaxation and increased filling pressure (grade 2 diastolic dysfunction). Doppler parameters are consistent with high ventricular filling pressure. - Aortic valve: Trileaflet; normal thickness, mildly calcified leaflets. - Mitral valve: Severely calcified annulus. There was mild regurgitation. - Left atrium: The atrium was moderately dilated. - Pulmonic valve: There was trivial regurgitation.  Patient Profile     76 yo male with PMH of COPD, HTN, HLD, Seizures, tobacco use, ETOH use, brain aneurysm who presented with increasing lower extremity edema, and falls.  Assessment & Plan    1. Atrial fibrillation with rapid ventricular response-paroxysmal -Reported progressive weakness and lower extremity edema over past couple of weeks. Unaware of any palpitations. [name] has had frequent falls and is a daily alcohol user sometimes to the point of passing out. EKG on arrival showed atrial fibrillation with rapid ventricular response rate in the 170s. Started on IV Cardizem with improvement in afib rate and subsequent conversion to sinus rhythm at 1354 on 1/10 in the 70's. -Atrial fibrillation was of unknown duration -IV heparin initiated for stroke risk reduction related to atrial fibrillation. CHA2DS2-VASc score is 4 ( HTN, AGE (2), Vascular disease). Pt is not a good candidate for anticoagulation due to his daily heavy alcohol use, frequent falls and history of seizures. Full dose aspirin started. PPI added. -LV systolic is normal with EF 60-65%.  -Pt more alert since off Ativan for alcohol withdrawal -Had episode of aftb  RVR X45 min this morning. Back in sinus rhythm. Cardizem increased form 120 mg to 180mg . -Cardiology follow up at our office has been arranged for 1/18  2. Heart failure symptoms -Elevated BNP of 1172, 2+ pitting lower extremity edema -Baseline weight has been 122-125 pounds over the last year per office notes.  - Weight on admission was 121 pounds, 01/11> 120 lbs -Chest x-ray showed COPD and no acute cardiopulmonary disease -Symptoms concerning for heart  failure. Echocardiogram showed normal LV systolic function and grade 2 diastolic dysfunction -Patient has risk factor for heart failure including daily alcohol use, hypertension, CRI -Received lasix 40 mg IV  with good urine output and resolution of edema, transitioned to oral lasix 20 mg daily, no edema or shortness of breath  3. Elevated troponin -Troponins mildly elevated 0.16 (POC), 0.35, 0.46, 0.35. No chest pain. This is likely related to demand ischemia insetting of rapid atrial fibrillation. - Echocardiogram shows normal LV systolic function and normal wall motion. -Ischemia may be an underlying cause of atrial fibrillation, however, could be related to heavy alcohol use and/or chronic renal insufficiency.  - No plan for ischemia work up at this point  4. Hypertension -Blood pressures have been running systolic BP 694'W-546'E, better today 123-130/68-77 -Home Amlodipine was discontinued when cardizem initiated. -Assess response to cardizem and can address at follow up, will likely needed further BP control  5. Chronic renal insufficiency, Stage III -Baseline creatinine in the 1.3-1.6 range. Creatinine on admission 04/14/16-1.41, 1/10-1.25 - 01/11, BUN/Cr 15/1.72. Not on ACE/ARB  6. Hyperlipidemia -Lipid panel, 04/15/16, reveals total cholesterol 164, HDL 103, LDL 51- is to goal of <100 -Continue statin  7. Seizures -No recent seizure activity, patient maintained on Depakote -Recent falls per sister, do not sound like  seizures -Neurology consulted for EEG which was normal but does not exclude a clinical diagnosis of epilepsy  8. R thigh hematoma -New last night. Heparin stopped -CT this morning: Infiltrating hematoma in the vastus musculature is most notable in the vastus intermedius. Discrete measurement is not possible. No muscle or tendon tear is identified. -Hemoglobin form 12.3 to 9.6 -Will continue to watch  Signed, Rosaria Ferries, [name]  04/17/2016, 8:57 AM    History and all data above reviewed.  Patient examined.  I agree with the findings as above.  [name] does have leg pain.  Transient atrial fib noted.  The patient exam reveals COR:RRR  ,  Lungs: Clear  ,  Abd: Positive bowel sounds, no rebound no guarding, Ext No edema   .  All available labs, radiology testing, previous records reviewed. Agree with documented assessment and plan.  Watch creat with blood loss.  I am going to hold the Lasix.  Transient atrial fib.  Cardizem increased.      Jeneen Rinks Sameena Artus  3:08 PM  04/17/2016

## 2016-04-17 NOTE — Progress Notes (Signed)
RT upper leg/thigh area with hematoma noted. H/H 9.6/29.1 from 12.3/36.6 prior. Dr. Tana Coast notified. Orders received. Heparin gtt d/c'd at this time. Ice pack placed over swollen area of hematoma. Denies pain at this time.

## 2016-04-17 NOTE — Progress Notes (Signed)
[facility]                                                                              Patient Demographics  [name], is a 76 y.o. male, DOB - 11/30/1940, GYJ:[id number]  Admit date - 04/14/2016   Admitting Physician Jeremy Costa, MD  Outpatient Primary MD for the patient is Jeremy Chandler, NP  Outpatient specialists:   LOS - 3  days    Chief Complaint  Patient presents with  . Tachycardia       Brief summary   Patient is a 76 year old male with COPD, HTN, HLD, seizures, former smoker, alcohol abuse, s/p of brain aneurysm repair who presented weakness weakness and fall. Per patient's sister, he was noted to have increasing lower extremity edema. In ED, patient was noted have rapid atrial fibrillation with heart rate in 170s. He was started on IV Cardizem. BNP 1172 Troponin 0.16. A chest x-ray showed COPD but no acute findings. Patient was admitted to stepdown unit. He was started on heparin drip.   Assessment & Plan    Principal Problem:  Atrial fibrillation with RVR [facility]):  not sure about the exact onset time. Pt has ongoing weakness over the past couple weeks, indicating onset >48 hours. This is likely triggered by alcohol abuse and possible CHF given his elevated BNP, JVD and 2+ pitting lower extremity edema.  - Patient was initially started on IV Cardizem drip, Then transitioned to oral Cardizem 120 mg daily - Converted to normal sinus rhythm  - Cardiology was consulted, recommended no ischemia workup at this time  - 2-D echo showed EF of 60-65% with grade 2 diastolic dysfunction -Overnight went back to Rapid H of ablation with RVR for 45 minutes, now back in normal sinus rhythm, I increased the Cardizem from 120 mg to 180 mg - Follow up cardiology arranged for 1/18 -Heparin drip discontinued due to right thigh hematoma - Not a candidate for anti-correlation due to daily heavy alcohol use, frequent falls and history of seizures - Full dose aspirin was  started however currently on hold secondary to right thigh hematoma, will be restarted with the resolution of hematoma   Right thigh hematoma - Hold aspirin, heparin discontinued - CT femur obtained showed infiltrating hematoma in the vastus intermedius  Acute encephalopathy  - 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  -seizure precaution -continue Depakote and Keppra -EEG normal  Fall:  - CT-head negative for acute intracranial abnormalities but showed stable atrophy. Sister reports that he has been having falling episodes, which don't sound like seizures. Has not seen a neurologist in many years.  - EEG normal  Elevated Trop: - Elevated troponins likely due to demand ischemia -- 2-D echo showed EF of 60-65% with grade 2 diastolic dysfunction  CKD (chronic kidney disease), stage III: Stable. Baseline creatinine 1.2-1.4. His Cre is 1.41 on admission. -  Creatinine improving, at baseline  Alcohol abuse: - continue CIWA with PRN ativan    Code Status: Full CODE STATUS  DVT Prophylaxis:Heparin  Family Communication: Discussed in detail with the patient, all imaging results, lab results explained to the patient.   Disposition Plan: PT recommended skilled nursing severity, social worker consult placed  Time Spent in minutes 25 minutes  Procedures:    Consultants:   Cardiology  Antimicrobials:      Medications  Scheduled Meds: . atorvastatin  10 mg Oral q1800  . diltiazem  180 mg Oral Daily  . divalproex  250 mg Oral R6V  . folic acid  1 mg Oral Daily  . furosemide  20 mg Oral Daily  . gatifloxacin  1 drop Left Eye QID  . ketorolac  1 drop Left Eye  QID  . levETIRAcetam  500 mg Oral BID  . multivitamin with minerals  1 tablet Oral Daily  . nicotine  21 mg Transdermal Daily  . pantoprazole  40 mg Oral Daily  . sodium chloride flush  3 mL Intravenous Q12H  . thiamine  100 mg Oral Daily   Continuous Infusions:  PRN Meds:.sodium chloride, acetaminophen, levalbuterol, LORazepam **OR** LORazepam, morphine injection, nitroGLYCERIN, sodium chloride flush, zolpidem   Antibiotics   Anti-infectives    None        Subjective:   [name] was seen and examined today. Overnight events noted, Afib with RVR, back in normal sinus rhythm.  Patient denies dizziness, chest pain, shortness of breath, abdominal pain, N/V/D/C, new weakness, numbess, tingling. still somewhat confused  Objective:   Vitals:   04/17/16 0753 04/17/16 0755 04/17/16 0800 04/17/16 1021  BP:    130/68  Pulse: 85 79 82 95  Resp: 15  17 (!) 24  Temp:      TempSrc:      SpO2: 100%  100% 97%  Weight:        Intake/Output Summary (Last 24 hours) at 04/17/16 1240 Last data filed at 04/17/16 0343  Gross per 24 hour  Intake              480 ml  Output              425 ml  Net               55 ml     Wt Readings from Last 3 Encounters:  04/17/16 54.7 kg (120 lb 9.6 oz)  01/24/16 56.1 kg (123 lb 10.1 oz)  12/13/15 56.7 kg (125 lb)     Exam  General: Alert and oriented x 2, knows his name and he is in the hospital, NAD  HEENT:    Neck:   Cardiovascular: S1 S2,RRR  Respiratory:   CTAB  Gastrointestinal: Soft, nontender, nondistended, + bowel sounds  Ext: no cyanosis clubbing or edema, hematoma on the right upper thigh   Neuro:no new deficits   Skin: No rashes  Psych: Somewhat confused, alert and oriented x2    Data Reviewed:  I have personally reviewed following labs and imaging studies  Micro Results No results found for this or any previous visit (from the past 240 hour(s)).  Radiology Reports Ct Head Wo Contrast  Result Date:  04/14/2016 CLINICAL DATA:  Initial evaluation for acute fall. EXAM: CT HEAD WITHOUT CONTRAST TECHNIQUE: Contiguous axial images were obtained from the base of the skull through the vertex without intravenous contrast. COMPARISON:  Prior CT from 04/25/2012. FINDINGS: Brain: Diffuse prominence of the CSF containing  spaces is compatible with generalized cerebral atrophy. Patchy and confluent hypodensity within the periventricular and deep white matter both cerebral hemispheres most consistent with chronic microvascular ischemic disease. Encephalomalacia out within the high right frontal lobe is stable. Additional focal encephalomalacia within the high left frontal lobe also stable. Chronic encephalomalacia present at the anterior left temporal pole. Aneurysm clip present at the level of the left ICA terminus. No acute intracranial hemorrhage. No evidence for acute large vessel territory infarct. No mass lesion, midline shift or mass effect. Ventricular prominence related underlying atrophy without hydrocephalus. No extra-axial fluid collection. Vascular: No hyperdense vessel. Aneurysm clip at the left ICA terminus. Intracranial atherosclerosis noted. A a a Skull: Scalp soft tissues demonstrate no acute abnormality. Post craniotomy changes present at the left frontal calvarium. Remote craniotomy/ burr hole at the right frontal calvarium. No acute skull fracture or other abnormality. Sinuses/Orbits: Globes and orbital soft tissues within normal limits. Patient is status post lens extraction on the left. Paranasal sinuses are clear. No mastoid effusion. IMPRESSION: 1. No acute intracranial process identified. 2. Postsurgical and chronic changes as above, stable. 3. Underlying cerebral atrophy with chronic microvascular ischemic disease, also stable. Electronically Signed   By: Jeannine Boga M.D.   On: 04/14/2016 21:09   Ct Femur Right Wo Contrast  Result Date: 04/17/2016 CLINICAL DATA:  Numerous falls. Right  groin hematoma first identified this morning. EXAM: CT OF THE LOWER RIGHT EXTREMITY WITHOUT CONTRAST TECHNIQUE: Multidetector CT imaging of the right lower extremity was performed according to the standard protocol. COMPARISON:  None. FINDINGS: Bones/Joint/Cartilage No acute bony or joint abnormality is identified. No focal bony lesion. Ligaments Suboptimally assessed by CT. Muscles and Tendons The vastus musculature is markedly swollen and hyperattenuating consistent with the presence of hematoma. Discrete measurement is not possible. Edematous change and increased attenuation are worst in the vastus intermedius. No muscle or tendon tear is identified. Soft tissues Mild infiltration of subcutaneous fat is present about the right upper leg. Atherosclerotic vascular disease is noted. Small knee joint effusion is seen. IMPRESSION: Infiltrating hematoma in the vastus musculature is most notable in the vastus intermedius. Discrete measurement is not possible. No muscle or tendon tear is identified. No acute bony abnormality. Atherosclerosis. Electronically Signed   By: Inge Rise M.D.   On: 04/17/2016 09:52   Dg Chest Portable 1 View  Result Date: 04/14/2016 CLINICAL DATA:  Generalized weakness. History of seizure activity, COPD, former smoker. EXAM: PORTABLE CHEST 1 VIEW COMPARISON:  Chest x-ray of April 28, 2012 FINDINGS: The lungs are well-expanded and clear. The heart and pulmonary vascularity are normal. The mediastinum is normal in width. There is calcification in the wall of the aortic arch. There is no pleural effusion. The bony thorax exhibits no acute abnormality. IMPRESSION: COPD.  There is no active cardiopulmonary disease. Electronically Signed   By: David  Martinique M.D.   On: 04/14/2016 15:10    Lab Data:  CBC:  Recent Labs Lab 04/14/16 1612 04/15/16 0912 04/16/16 0658 04/17/16 0531  WBC 7.7 6.7 7.4 6.2  HGB 13.6 12.2* 12.3* 9.6*  HCT 38.9* 36.4* 36.6* 29.1*  MCV 91.5 92.2 93.4  92.7  PLT 134* 122* 120* 063*   Basic Metabolic Panel:  Recent Labs Lab 04/14/16 1612 04/15/16 0912 04/16/16 0658 04/17/16 0531  NA 140 141 139 138  K 3.8 3.6 4.3 4.3  CL 103 102 103 102  CO2 26 31 29 27   GLUCOSE 156* 98 85 127*  BUN 15 11 9  15  CREATININE 1.41* 1.25* 1.21 1.72*  CALCIUM 9.3 9.1 9.0 8.5*  MG 1.9  --   --   --    GFR: Estimated Creatinine Clearance: 27.8 mL/min (by C-G formula based on SCr of 1.72 mg/dL (H)). Liver Function Tests: No results for input(s): AST, ALT, ALKPHOS, BILITOT, PROT, ALBUMIN in the last 168 hours. No results for input(s): LIPASE, AMYLASE in the last 168 hours. No results for input(s): AMMONIA in the last 168 hours. Coagulation Profile:  Recent Labs Lab 04/14/16 1612  INR 0.91   Cardiac Enzymes:  Recent Labs Lab 04/14/16 2100 04/15/16 0211 04/15/16 0912  TROPONINI 0.35* 0.46* 0.35*   BNP (last 3 results) No results for input(s): PROBNP in the last 8760 hours. HbA1C:  Recent Labs  04/15/16 0211  HGBA1C 5.2   CBG: No results for input(s): GLUCAP in the last 168 hours. Lipid Profile:  Recent Labs  04/15/16 0211  CHOL 164  HDL 103  LDLCALC 51  TRIG 48  CHOLHDL 1.6   Thyroid Function Tests:  Recent Labs  04/14/16 1612 04/15/16 0912  TSH 1.992  --   FREET4  --  1.36*   Anemia Panel: No results for input(s): VITAMINB12, FOLATE, FERRITIN, TIBC, IRON, RETICCTPCT in the last 72 hours. Urine analysis: No results found for: COLORURINE, APPEARANCEUR, LABSPEC, PHURINE, GLUCOSEU, HGBUR, BILIRUBINUR, KETONESUR, PROTEINUR, UROBILINOGEN, NITRITE, Corliss Skains M.D. [facility] 04/17/2016, 12:40 PM  Pager: [phone] Between 7am to 7pm - call Pager - [phone]  After 7pm go to [email or web address] - password [email or web address]  Call night coverage person covering after 7pm

## 2016-04-17 NOTE — Clinical Social Work Note (Signed)
Clinical Social Work Assessment  Patient Details  Name: [name] MRN: [id number] Date of Birth: 1940/12/23  Date of referral:  04/17/16               Reason for consult:  Facility Placement                Permission sought to share information with:  Family Supports Permission granted to share information::     Name::        Agency::     Relationship::     Contact Information:     Housing/Transportation Living arrangements for the past 2 months:  Single Family Home Source of Information:  Other (Comment Required) (Sibling) Patient Interpreter Needed:  None Criminal Activity/Legal Involvement Pertinent to Current Situation/Hospitalization:  No - Comment as needed Significant Relationships:  Siblings Lives with:  Self Do you feel safe going back to the place where you live?  Yes Need for family participation in patient care:  No (Coment)  Care giving concerns:  CSW spoke with pt's brother. Per report pt has fluctuating orientation. FYI in chart stating permission was granted in order for hospital staff to talk with pt's brother.   Social Worker assessment / plan:  CSW spoke with pt's brother. Pt lives alone. Pt's brother states he is aware pt is going to SNF placement at d/c-family agreeable. Pt's brother request his brother be sent to the facility on [facility]. CSW will reach out to facility at this time.   Employment status:  Retired Nurse, adult PT Recommendations:  [place] / Referral to community resources:  [place]  Patient/Family's Response to care:  Pt's brother verbalized understanding of CSW role and expressed appreciation for support. Pt's brother denies any concern regarding pt care at this time.   Patient/Family's Understanding of and Emotional Response to Diagnosis, Current Treatment, and Prognosis:  Pt's brother understanding and realistic regarding pt's physical  limitations. Pt's family agreeable to SNF placement at this time. Pt prefers [facility]. Pt's brother denies any concern regarding pt treatment plan at this time.  CSW will continue to provide support.  Emotional Assessment Appearance:  Appears stated age Attitude/Demeanor/Rapport:  Unable to Assess Affect (typically observed):  Unable to Assess Orientation:  Fluctuating Orientation (Suspected and/or reported Sundowners) Alcohol / Substance use:  Not Applicable Psych involvement (Current and /or in the community):  No (Comment)  Discharge Needs  Concerns to be addressed:  No discharge needs identified Readmission within the last 30 days:  No Current discharge risk:  Dependent with Mobility Barriers to Discharge:  Continued Medical Work up   [employer], LCSW 04/17/2016, 11:31 AM

## 2016-04-17 NOTE — NC FL2 (Signed)
[place] MEDICAID FL2 LEVEL OF CARE SCREENING TOOL     IDENTIFICATION  Patient Name: GODFREY TRITSCHLER Birthdate: [birth year] Sex: male Admission Date (Current Location): 04/14/2016  [facility] and [place] Number:  Herbalist and Address:  [facility] [place] [address] [place], [place] [address]      Provider Number: [id number]  Attending Physician Name and Address:  Mendel Corning, MD  Relative Name and Phone Number:       Current Level of Care: Hospital Recommended Level of Care: [place] Prior Approval Number:    Date Approved/Denied: 04/16/16 PASRR Number:  [id number]  Discharge Plan: SNF    Current Diagnoses: Patient Active Problem List   Diagnosis Date Noted  . Atrial fibrillation with RVR ([place]) 04/14/2016  . Chronic diastolic heart failure ([place]) 04/14/2016  . Weakness 04/14/2016  . Elevated troponin 04/14/2016  . Fall 04/14/2016  . Alcohol abuse 04/14/2016  . CKD (chronic kidney disease), stage III   . PVD (peripheral vascular disease) ([place]) 03/07/2014  . Cognitive impairment 03/07/2014  . Essential hypertension, benign 02/02/2013  . Routine general medical examination at a health care facility 02/02/2013  . Need for prophylactic vaccination and inoculation against influenza 02/02/2013  . Hyperlipidemia LDL goal <130 02/02/2013  . Hypertensive renal disease 02/02/2013  . Seizures ([place]) 07/28/2012  . Tobacco use disorder 07/28/2012    Orientation RESPIRATION BLADDER Height & Weight     Self, Time, Situation, Place (Flunctuating orientation)  Normal Continent Weight: 120 lb 9.6 oz (54.7 kg) Height:     BEHAVIORAL SYMPTOMS/MOOD NEUROLOGICAL BOWEL NUTRITION STATUS      Continent  (Please see discharge summary)  AMBULATORY STATUS COMMUNICATION OF NEEDS Skin   Extensive Assist Verbally Normal                       Personal Care Assistance Level of Assistance  Bathing, Feeding, Dressing Bathing Assistance:  Maximum assistance Feeding assistance: Independent Dressing Assistance: Maximum assistance     Functional Limitations Info  Hearing, Speech, Sight Sight Info: Adequate Hearing Info: Adequate Speech Info: Adequate    SPECIAL CARE FACTORS FREQUENCY  OT (By licensed OT), PT (By licensed PT)     PT Frequency: 3x week OT Frequency: 3x week            Contractures Contractures Info: Not present    Additional Factors Info  Code Status, Allergies Code Status Info: Full Code Allergies Info: Penicillins           Current Medications (04/17/2016):  This is the current hospital active medication list Current Facility-Administered Medications  Medication Dose Route Frequency Provider Last Rate Last Dose  . 0.9 %  sodium chloride infusion  250 mL Intravenous PRN Ivor Costa, MD      . acetaminophen (TYLENOL) tablet 650 mg  650 mg Oral Q4H PRN Ivor Costa, MD      . atorvastatin (LIPITOR) tablet 10 mg  10 mg Oral q1800 Ivor Costa, MD   10 mg at 04/16/16 1730  . diltiazem (CARDIZEM CD) 24 hr capsule 180 mg  180 mg Oral Daily Ripudeep K Rai, MD   180 mg at 04/17/16 1033  . divalproex (DEPAKOTE) DR tablet 250 mg  250 mg Oral Q8H Ivor Costa, MD   250 mg at 04/17/16 0606  . folic acid (FOLVITE) tablet 1 mg  1 mg Oral Daily Ivor Costa, MD   1 mg at 04/16/16 0900  . furosemide (  LASIX) tablet 20 mg  20 mg Oral Daily Daune Perch, NP   20 mg at 04/17/16 1033  . gatifloxacin (ZYMAXID) 0.5 % ophthalmic drops 1 drop  1 drop Left Eye QID Ivor Costa, MD   1 drop at 04/17/16 1034  . ketorolac (ACULAR) 0.5 % ophthalmic solution 1 drop  1 drop Left Eye QID Ivor Costa, MD   1 drop at 04/17/16 1034  . levalbuterol (XOPENEX) nebulizer solution 1.25 mg  1.25 mg Nebulization Q6H PRN Ivor Costa, MD      . levETIRAcetam (KEPPRA) tablet 500 mg  500 mg Oral BID Ivor Costa, MD   500 mg at 04/17/16 1032  . LORazepam (ATIVAN) tablet 1 mg  1 mg Oral Q6H PRN Ripudeep Krystal Eaton, MD       Or  . LORazepam (ATIVAN) injection 1 mg   1 mg Intravenous Q6H PRN Ripudeep K Rai, MD      . morphine 2 MG/ML injection 1 mg  1 mg Intravenous Q4H PRN Ripudeep K Rai, MD      . multivitamin with minerals tablet 1 tablet  1 tablet Oral Daily Ivor Costa, MD   1 tablet at 04/17/16 1033  . nicotine (NICODERM CQ - dosed in mg/24 hours) patch 21 mg  21 mg Transdermal Daily Ripudeep K Rai, MD      . nitroGLYCERIN (NITROSTAT) SL tablet 0.4 mg  0.4 mg Sublingual Q5 min PRN Ivor Costa, MD      . pantoprazole (PROTONIX) EC tablet 40 mg  40 mg Oral Daily Daune Perch, NP   40 mg at 04/17/16 1033  . sodium chloride flush (NS) 0.9 % injection 3 mL  3 mL Intravenous Q12H Ivor Costa, MD   3 mL at 04/17/16 1035  . sodium chloride flush (NS) 0.9 % injection 3 mL  3 mL Intravenous PRN Ivor Costa, MD      . thiamine (VITAMIN B-1) tablet 100 mg  100 mg Oral Daily Ivor Costa, MD   100 mg at 04/17/16 1033  . zolpidem (AMBIEN) tablet 5 mg  5 mg Oral QHS PRN Ivor Costa, MD         Discharge Medications: Please see discharge summary for a list of discharge medications.  Relevant Imaging Results:  Relevant Lab Results:   Additional Information SSN; [id number]  Alla German, LCSW

## 2016-04-18 LAB — CBC
HCT: 22.7 % — ABNORMAL LOW (ref 39.0–52.0)
HEMOGLOBIN: 7.8 g/dL — AB (ref 13.0–17.0)
MCH: 31.1 pg (ref 26.0–34.0)
MCHC: 34.4 g/dL (ref 30.0–36.0)
MCV: 90.4 fL (ref 78.0–100.0)
Platelets: 116 10*3/uL — ABNORMAL LOW (ref 150–400)
RBC: 2.51 MIL/uL — AB (ref 4.22–5.81)
RDW: 14 % (ref 11.5–15.5)
WBC: 7.3 10*3/uL (ref 4.0–10.5)

## 2016-04-18 LAB — BASIC METABOLIC PANEL
ANION GAP: 10 (ref 5–15)
BUN: 25 mg/dL — ABNORMAL HIGH (ref 6–20)
CALCIUM: 8.5 mg/dL — AB (ref 8.9–10.3)
CO2: 27 mmol/L (ref 22–32)
Chloride: 101 mmol/L (ref 101–111)
Creatinine, Ser: 2.46 mg/dL — ABNORMAL HIGH (ref 0.61–1.24)
GFR, EST AFRICAN AMERICAN: 28 mL/min — AB (ref >60)
GFR, EST NON AFRICAN AMERICAN: 24 mL/min — AB (ref >60)
Glucose, Bld: 105 mg/dL — ABNORMAL HIGH (ref 65–99)
Potassium: 4.7 mmol/L (ref 3.5–5.1)
SODIUM: 138 mmol/L (ref 135–145)

## 2016-04-18 LAB — PREPARE RBC (CROSSMATCH)

## 2016-04-18 LAB — HEMOGLOBIN AND HEMATOCRIT, BLOOD
HCT: 31.8 % — ABNORMAL LOW (ref 39.0–52.0)
HEMOGLOBIN: 11.1 g/dL — AB (ref 13.0–17.0)

## 2016-04-18 LAB — ABO/RH: ABO/RH(D): B POS

## 2016-04-18 MED ORDER — SENNOSIDES-DOCUSATE SODIUM 8.6-50 MG PO TABS
1.0000 | ORAL_TABLET | Freq: Two times a day (BID) | ORAL | Status: DC
  Administered 2016-04-18 – 2016-04-19 (×3): 1 via ORAL
  Filled 2016-04-18 (×3): qty 1

## 2016-04-18 MED ORDER — POLYETHYLENE GLYCOL 3350 17 G PO PACK
17.0000 g | PACK | Freq: Every day | ORAL | Status: DC
  Administered 2016-04-18 – 2016-04-19 (×2): 17 g via ORAL
  Filled 2016-04-18 (×2): qty 1

## 2016-04-18 MED ORDER — SODIUM CHLORIDE 0.9 % IV SOLN
Freq: Once | INTRAVENOUS | Status: AC
  Administered 2016-04-18: 11:00:00 via INTRAVENOUS

## 2016-04-18 NOTE — Care Management Important Message (Signed)
Important Message  Patient Details  Name: [name] MRN: [id number] Date of Birth: [birth year]   Medicare Important Message Given:  Yes    Nathen May 04/18/2016, 12:35 PM

## 2016-04-18 NOTE — Progress Notes (Signed)
[facility]                                                                              Patient Demographics  [name], is a 76 y.o. male, DOB - 1940-10-27, VEH:[id number]  Admit date - 04/14/2016   Admitting Physician Ivor Costa, MD  Outpatient Primary MD for the patient is [name], NP  Outpatient specialists:   LOS - 4  days    Chief Complaint  Patient presents with  . Tachycardia       Brief summary   Patient is a 76 year old male with COPD, HTN, HLD, seizures, former smoker, alcohol abuse, s/p of brain aneurysm repair who presented weakness weakness and fall. Per patient's sister, he was noted to have increasing lower extremity edema. In ED, patient was noted have rapid atrial fibrillation with heart rate in 170s. He was started on IV Cardizem. BNP 1172 Troponin 0.16. A chest x-ray showed COPD but no acute findings. Patient was admitted to stepdown unit. He was started on heparin drip.   Assessment & Plan    Principal Problem:  Atrial fibrillation with RVR [facility]):  not sure about the exact onset time. Pt has ongoing weakness over the past couple weeks, indicating onset >48 hours. This is likely triggered by alcohol abuse and possible CHF given his elevated BNP, JVD and 2+ pitting lower extremity edema.  - Patient was initially started on IV Cardizem drip, He has been now transitioned to oral Cardizem 180 mg daily. Converted to normal sinus rhythm  - Cardiology was consulted, recommended no ischemia workup at this time  - 2-D echo showed EF of 60-65% with grade 2 diastolic dysfunction - Follow up cardiology arranged for 1/18 -Heparin drip discontinued due to right thigh hematoma - Not a candidate for anti-coagulation due to daily heavy alcohol use, frequent falls and history of seizures - Full dose aspirin was started however currently on hold secondary to right thigh hematoma, will be restarted with the resolution of hematoma   Right thigh  hematoma - Hold aspirin, heparin discontinued - CT femur obtained showed infiltrating hematoma in the vastus intermedius  Acute anemia: Likely due to right thigh hematoma - Hemoglobin 7.8 today - Transfuse 2 units packed RBCs  Acute kidney injury on chronic kidney disease stage III, baseline creatinine 1.2-1.4 - Likely due to hypoperfusion and acute blood loss anemia due to right thigh hematoma - Creatinine bumped to 2.46 - Transfuse 2 units packed RBCs, given CHF, will avoid IV fluid hydration - Recheck BMET in a.m.  Acute encephalopathy  - 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  -seizure precaution -continue Depakote and Keppra -EEG normal  Fall:  - CT-head negative for acute intracranial abnormalities but showed stable atrophy. Sister reports that he has been having falling episodes, which don't sound like seizures. Has not seen a neurologist in many years.  -  EEG normal  Elevated Trop: - Elevated troponins likely due to demand ischemia -- 2-D echo showed EF of 60-65% with grade 2 diastolic dysfunction  Alcohol abuse: - continue CIWA with PRN ativan    Code Status: Full CODE STATUS  DVT Prophylaxis:Heparin  Family Communication: Discussed in detail with the patient, all imaging results, lab results explained to the patient.   Disposition Plan: DC in  AM if creatinine and H&H improved  Time Spent in minutes 25 minutes  Procedures:    Consultants:   Cardiology  Antimicrobials:      Medications  Scheduled Meds: . atorvastatin  10 mg Oral q1800  . diltiazem  180 mg Oral Daily  . divalproex  250 mg Oral Z5G  . folic acid  1 mg Oral Daily  .  gatifloxacin  1 drop Left Eye QID  . ketorolac  1 drop Left Eye QID  . levETIRAcetam  500 mg Oral BID  . multivitamin with minerals  1 tablet Oral Daily  . nicotine  21 mg Transdermal Daily  . pantoprazole  40 mg Oral Daily  . sodium chloride flush  3 mL Intravenous Q12H  . thiamine  100 mg Oral Daily   Continuous Infusions:  PRN Meds:.sodium chloride, acetaminophen, levalbuterol, LORazepam **OR** LORazepam, morphine injection, nitroGLYCERIN, sodium chloride flush, zolpidem   Antibiotics   Anti-infectives    None        Subjective:   Hatim Homann was seen and examined today. Much more alert and oriented today, no acute complaints.  Patient denies dizziness, chest pain, shortness of breath, abdominal pain, N/V/D/C, new weakness, numbess, tingling  Objective:   Vitals:   04/18/16 0514 04/18/16 1045 04/18/16 1115 04/18/16 1122  BP: 99/68 107/72  111/75  Pulse: 91 92  (!) 102  Resp: 19 16 18    Temp: 98.7 F (37.1 C) 98.4 F (36.9 C)  99 F (37.2 C)  TempSrc: Oral Oral  Tympanic  SpO2: 96% 99%  100%  Weight:        Intake/Output Summary (Last 24 hours) at 04/18/16 1228 Last data filed at 04/18/16 1225  Gross per 24 hour  Intake              462 ml  Output                0 ml  Net              462 ml     Wt Readings from Last 3 Encounters:  04/17/16 54.7 kg (120 lb 9.6 oz)  01/24/16 56.1 kg (123 lb 10.1 oz)  12/13/15 56.7 kg (125 lb)     Exam  General: Alert and oriented x 2,   HEENT:    Neck:   Cardiovascular: S1 S2,RRR  Respiratory:   CTAB  Gastrointestinal: Soft, nontender, nondistended, + bowel sounds  Ext: no cyanosis clubbing or edema, hematoma on the right upper thigh   Neuro:no new deficits   Skin: No rashes  Psych: alert and oriented x2    Data Reviewed:  I have personally reviewed following labs and imaging studies  Micro Results No results found for this or any previous visit (from the past 240 hour(s)).  Radiology Reports Ct  Head Wo Contrast  Result Date: 04/14/2016 CLINICAL DATA:  Initial evaluation for acute fall. EXAM: CT HEAD WITHOUT CONTRAST TECHNIQUE: Contiguous axial images were obtained from the base of the skull through the vertex without intravenous contrast. COMPARISON:  Prior CT from 04/25/2012. FINDINGS:  Brain: Diffuse prominence of the CSF containing spaces is compatible with generalized cerebral atrophy. Patchy and confluent hypodensity within the periventricular and deep white matter both cerebral hemispheres most consistent with chronic microvascular ischemic disease. Encephalomalacia out within the high right frontal lobe is stable. Additional focal encephalomalacia within the high left frontal lobe also stable. Chronic encephalomalacia present at the anterior left temporal pole. Aneurysm clip present at the level of the left ICA terminus. No acute intracranial hemorrhage. No evidence for acute large vessel territory infarct. No mass lesion, midline shift or mass effect. Ventricular prominence related underlying atrophy without hydrocephalus. No extra-axial fluid collection. Vascular: No hyperdense vessel. Aneurysm clip at the left ICA terminus. Intracranial atherosclerosis noted. A a a Skull: Scalp soft tissues demonstrate no acute abnormality. Post craniotomy changes present at the left frontal calvarium. Remote craniotomy/ burr hole at the right frontal calvarium. No acute skull fracture or other abnormality. Sinuses/Orbits: Globes and orbital soft tissues within normal limits. Patient is status post lens extraction on the left. Paranasal sinuses are clear. No mastoid effusion. IMPRESSION: 1. No acute intracranial process identified. 2. Postsurgical and chronic changes as above, stable. 3. Underlying cerebral atrophy with chronic microvascular ischemic disease, also stable. Electronically Signed   By: Jeannine Boga M.D.   On: 04/14/2016 21:09   Ct Femur Right Wo Contrast  Result Date: 04/17/2016 CLINICAL  DATA:  Numerous falls. Right groin hematoma first identified this morning. EXAM: CT OF THE LOWER RIGHT EXTREMITY WITHOUT CONTRAST TECHNIQUE: Multidetector CT imaging of the right lower extremity was performed according to the standard protocol. COMPARISON:  None. FINDINGS: Bones/Joint/Cartilage No acute bony or joint abnormality is identified. No focal bony lesion. Ligaments Suboptimally assessed by CT. Muscles and Tendons The vastus musculature is markedly swollen and hyperattenuating consistent with the presence of hematoma. Discrete measurement is not possible. Edematous change and increased attenuation are worst in the vastus intermedius. No muscle or tendon tear is identified. Soft tissues Mild infiltration of subcutaneous fat is present about the right upper leg. Atherosclerotic vascular disease is noted. Small knee joint effusion is seen. IMPRESSION: Infiltrating hematoma in the vastus musculature is most notable in the vastus intermedius. Discrete measurement is not possible. No muscle or tendon tear is identified. No acute bony abnormality. Atherosclerosis. Electronically Signed   By: Inge Rise M.D.   On: 04/17/2016 09:52   Dg Chest Portable 1 View  Result Date: 04/14/2016 CLINICAL DATA:  Generalized weakness. History of seizure activity, COPD, former smoker. EXAM: PORTABLE CHEST 1 VIEW COMPARISON:  Chest x-ray of April 28, 2012 FINDINGS: The lungs are well-expanded and clear. The heart and pulmonary vascularity are normal. The mediastinum is normal in width. There is calcification in the wall of the aortic arch. There is no pleural effusion. The bony thorax exhibits no acute abnormality. IMPRESSION: COPD.  There is no active cardiopulmonary disease. Electronically Signed   By: David  Martinique M.D.   On: 04/14/2016 15:10    Lab Data:  CBC:  Recent Labs Lab 04/14/16 1612 04/15/16 0912 04/16/16 0658 04/17/16 0531 04/18/16 0614  WBC 7.7 6.7 7.4 6.2 7.3  HGB 13.6 12.2* 12.3* 9.6* 7.8*    HCT 38.9* 36.4* 36.6* 29.1* 22.7*  MCV 91.5 92.2 93.4 92.7 90.4  PLT 134* 122* 120* 130* 193*   Basic Metabolic Panel:  Recent Labs Lab 04/14/16 1612 04/15/16 0912 04/16/16 0658 04/17/16 0531 04/18/16 0614  NA 140 141 139 138 138  K 3.8 3.6 4.3 4.3 4.7  CL 103 102 103  102 101  CO2 26 31 29 27 27   GLUCOSE 156* 98 85 127* 105*  BUN 15 11 9 15  25*  CREATININE 1.41* 1.25* 1.21 1.72* 2.46*  CALCIUM 9.3 9.1 9.0 8.5* 8.5*  MG 1.9  --   --   --   --    GFR: Estimated Creatinine Clearance: 19.5 mL/min (by C-G formula based on SCr of 2.46 mg/dL (H)). Liver Function Tests: No results for input(s): AST, ALT, ALKPHOS, BILITOT, PROT, ALBUMIN in the last 168 hours. No results for input(s): LIPASE, AMYLASE in the last 168 hours. No results for input(s): AMMONIA in the last 168 hours. Coagulation Profile:  Recent Labs Lab 04/14/16 1612  INR 0.91   Cardiac Enzymes:  Recent Labs Lab 04/14/16 2100 04/15/16 0211 04/15/16 0912  TROPONINI 0.35* 0.46* 0.35*   BNP (last 3 results) No results for input(s): PROBNP in the last 8760 hours. HbA1C: No results for input(s): HGBA1C in the last 72 hours. CBG: No results for input(s): GLUCAP in the last 168 hours. Lipid Profile: No results for input(s): CHOL, HDL, LDLCALC, TRIG, CHOLHDL, LDLDIRECT in the last 72 hours. Thyroid Function Tests: No results for input(s): TSH, T4TOTAL, FREET4, T3FREE, THYROIDAB in the last 72 hours. Anemia Panel: No results for input(s): VITAMINB12, FOLATE, FERRITIN, TIBC, IRON, RETICCTPCT in the last 72 hours. Urine analysis: No results found for: COLORURINE, APPEARANCEUR, LABSPEC, PHURINE, GLUCOSEU, HGBUR, BILIRUBINUR, KETONESUR, PROTEINUR, UROBILINOGEN, NITRITE, Corliss Skains M.D. [facility] 04/18/2016, 12:28 PM  Pager: [phone] Between 7am to 7pm - call Pager - [phone]  After 7pm go to [email or web address] - password [email or web address]  Call night coverage person covering after 7pm

## 2016-04-18 NOTE — Progress Notes (Signed)
Pt had a witnessed fall. Pt did not hit head. MD and family notified

## 2016-04-18 NOTE — Progress Notes (Signed)
Occupational Therapy Treatment Patient Details Name: OSWELL SAY MRN: [id number] DOB: Apr 01, 1941 Today's Date: 04/18/2016    History of present illness 76 year old male with COPD, HTN, HLD, seizures, former smoker, alcohol abuse, s/p of brain aneurysm repair who presented weakness weakness and fall.   OT comments  Pt impulsive, confused at times, and with increased agitation at end of session expressing desire to go home today. Pt requires min assist +2 for functional mobility with max multimodal cues for sequencing and safety of activities and use of RW. Pt quick to fatigue with increased HR during mobility. Continue to recommend SNF for follow up. Will continue to follow acutely.   Follow Up Recommendations  SNF;Supervision/Assistance - 24 hour    Equipment Recommendations  None recommended by OT    Recommendations for Other Services      Precautions / Restrictions Precautions Precautions: Fall Restrictions Weight Bearing Restrictions: No       Mobility Bed Mobility Overal bed mobility: Needs Assistance Bed Mobility: Supine to Sit     Supine to sit: Min assist     General bed mobility comments: Min A to get R LE to EOB and then pt impulsively went from supine to standing without really even sitting  Transfers Overall transfer level: Needs assistance Equipment used: Rolling walker (2 wheeled) Transfers: Sit to/from Stand Sit to Stand: Min assist;Mod assist         General transfer comment: very impulsive, went from supine> standing without really sitting.  Stood from Psychologist, occupational with MIN /MOD with cueing for hand placement    Balance Overall balance assessment: Needs assistance         Standing balance support: No upper extremity supported;During functional activity Standing balance-Leahy Scale: Poor                     ADL Overall ADL's : Needs assistance/impaired                         Toilet Transfer: Minimal assistance;+2 for  safety/equipment;Ambulation;RW;Cueing for safety;Cueing for sequencing Toilet Transfer Details (indicate cue type and reason): Pt very impulsive with transfers. Max cues for sequencing and safety. Toileting- Clothing Manipulation and Hygiene: Total assistance;Sit to/from stand Toileting - Clothing Manipulation Details (indicate cue type and reason): for peri care     Functional mobility during ADLs: Minimal assistance;+2 for safety/equipment;Cueing for safety;Cueing for sequencing;Rolling walker General ADL Comments: Pt very impulsive and confused this session, beginning to get agitated at end of session.       Vision                     Perception     Praxis      Cognition   Behavior During Therapy: [facility] for tasks assessed/performed Overall Cognitive Status: No family/caregiver present to determine baseline cognitive functioning                  General Comments: Impulsive with decreased awareness of deficits.    Extremity/Trunk Assessment               Exercises     Shoulder Instructions       General Comments      Pertinent Vitals/ Pain       Pain Assessment: Faces Faces Pain Scale: Hurts even more Pain Location: R shin Pain Descriptors / Indicators: Grimacing;Guarding;Sore Pain Intervention(s): Limited activity within patient's tolerance;Monitored during session;Repositioned  Home Living  Prior Functioning/Environment              Frequency  Min 2X/week        Progress Toward Goals  OT Goals(current goals can now be found in the care plan section)  Progress towards OT goals: Progressing toward goals  Acute Rehab OT Goals Patient Stated Goal: to go home OT Goal Formulation: With patient  Plan Discharge plan remains appropriate    Co-evaluation    PT/OT/SLP Co-Evaluation/Treatment: Yes Reason for Co-Treatment: Necessary to address cognition/behavior during  functional activity;For patient/therapist safety PT goals addressed during session: Mobility/safety with mobility;Balance OT goals addressed during session: ADL's and self-care      End of Session Equipment Utilized During Treatment: Gait belt;Rolling walker   Activity Tolerance Patient tolerated treatment well   Patient Left in chair;with call bell/phone within reach;with chair alarm set   Nurse Communication Mobility status (RN tech)        Time: 2081-3887 OT Time Calculation (min): 26 min  Charges: OT General Charges $OT Visit: 1 Procedure OT Treatments $Self Care/Home Management : 8-22 mins  Binnie Kand M.S., OTR/L Pager: [phone]  04/18/2016, 1:28 PM

## 2016-04-18 NOTE — Progress Notes (Signed)
Physical Therapy Treatment Patient Details Name: [name] MRN: [id number] DOB: 09-Apr-1940 Today's Date: 04/18/2016    History of Present Illness 76 year old male with COPD, HTN, HLD, seizures, former smoker, alcohol abuse, s/p of brain aneurysm repair who presented weakness weakness and fall.    PT Comments    Pt very impulsive and confused throughout session with decreased awareness of deficits.  Con't to recommend SNF.  Follow Up Recommendations  SNF;Supervision/Assistance - 24 hour     Equipment Recommendations  Other (comment) (to be determined)    Recommendations for Other Services       Precautions / Restrictions Precautions Precautions: Fall Restrictions Weight Bearing Restrictions: No    Mobility  Bed Mobility Overal bed mobility: Needs Assistance Bed Mobility: Supine to Sit     Supine to sit: Min assist     General bed mobility comments: Min A to get R LE to EOB and then pt impulsively went from supine to standing without really even sitting  Transfers       Sit to Stand: Min assist;Mod assist         General transfer comment: very impulsive, went from supine> standing without really sitting.  Stood from Psychologist, occupational with MIN /MOD with cueing for hand placement  Ambulation/Gait Ambulation/Gait assistance: Min assist;+2 safety/equipment Ambulation Distance (Feet): 35 Feet Assistive device: Rolling walker (2 wheeled) Gait Pattern/deviations: Decreased step length - right;Decreased step length - left;Antalgic;Trunk flexed     General Gait Details: Pt needs A to keep RW from going too far forward and almost constant cueing for staying within RW.  Decreased step length with antalgic gait. HR up to 130   Stairs            Wheelchair Mobility    Modified Rankin (Stroke Patients Only)       Balance Overall balance assessment: Needs assistance         Standing balance support: During functional activity Standing balance-Leahy Scale:  Poor                      Cognition Arousal/Alertness: Awake/alert Behavior During Therapy: WFL for tasks assessed/performed Overall Cognitive Status: No family/caregiver present to determine baseline cognitive functioning                 General Comments: Impulsive with decreased awareness of deficits.    Exercises      General Comments        Pertinent Vitals/Pain Pain Assessment: Faces Faces Pain Scale: Hurts even more Pain Location: R shin Pain Descriptors / Indicators: Grimacing;Guarding;Sore Pain Intervention(s): Limited activity within patient's tolerance;Monitored during session;Repositioned    Home Living                      Prior Function            PT Goals (current goals can now be found in the care plan section) Acute Rehab PT Goals PT Goal Formulation: Patient unable to participate in goal setting Time For Goal Achievement: 04/22/16 Potential to Achieve Goals: Fair Progress towards PT goals: Progressing toward goals    Frequency    Min 3X/week      PT Plan Current plan remains appropriate    Co-evaluation PT/OT/SLP Co-Evaluation/Treatment: Yes Reason for Co-Treatment: For patient/therapist safety PT goals addressed during session: Mobility/safety with mobility;Balance       End of Session Equipment Utilized During Treatment: Gait belt Activity Tolerance: Patient tolerated treatment well Patient left: in chair;with  call bell/phone within reach;with chair alarm set     Time: 1124-1150 PT Time Calculation (min) (ACUTE ONLY): 26 min  Charges:  $Gait Training: 8-22 mins                    G Codes:      [name] 04/18/2016, 12:32 PM

## 2016-04-18 NOTE — Progress Notes (Addendum)
Patient Name: [name] Date of Encounter: 04/18/2016  Primary Cardiologist: Dr. Sundra Aland Problem List     Principal Problem:   Atrial fibrillation with RVR [facility]) Active Problems:   Seizures ([place])   Essential hypertension, benign   Hyperlipidemia LDL goal <130   Chronic diastolic heart failure (HCC)   Elevated troponin   CKD (chronic kidney disease), stage III   Fall   Alcohol abuse     Subjective   No pain, except when his right thigh is pressed.  No palpitations.    Inpatient Medications    Scheduled Meds: . atorvastatin  10 mg Oral q1800  . diltiazem  180 mg Oral Daily  . divalproex  250 mg Oral J2I  . folic acid  1 mg Oral Daily  . gatifloxacin  1 drop Left Eye QID  . ketorolac  1 drop Left Eye QID  . levETIRAcetam  500 mg Oral BID  . multivitamin with minerals  1 tablet Oral Daily  . nicotine  21 mg Transdermal Daily  . pantoprazole  40 mg Oral Daily  . sodium chloride flush  3 mL Intravenous Q12H  . thiamine  100 mg Oral Daily   Continuous Infusions:  PRN Meds: sodium chloride, acetaminophen, levalbuterol, LORazepam **OR** LORazepam, morphine injection, nitroGLYCERIN, sodium chloride flush, zolpidem   Vital Signs    Vitals:   04/17/16 1021 04/17/16 1343 04/17/16 2126 04/18/16 0514  BP: 130/68 (!) 118/96 120/71 99/68  Pulse: 95 85 89 91  Resp: (!) 24  18 19   Temp:  97.6 F (36.4 C) 98.4 F (36.9 C) 98.7 F (37.1 C)  TempSrc:  Oral Oral Oral  SpO2: 97% 100% 93% 96%  Weight:       No intake or output data in the 24 hours ending 04/18/16 0750 Filed Weights   04/15/16 0500 04/16/16 0440 04/17/16 0334  Weight: 121 lb 0.5 oz (54.9 kg) 121 lb 11.1 oz (55.2 kg) 120 lb 9.6 oz (54.7 kg)    Physical Exam    GEN: NAD.  Neck:  No JVD Cardiac: Regular Rate and Rhythm, no murmurs, rubs, or gallops.  noedema.  Radials/DP/PT 2+  and equal bilaterally.  Right thigh hematoma Respiratory:  Respirations  regular and unlabored, clear to  auscultation bilaterally. GI: Soft, nontender, nondistended, BS + x 4. Skin: warm and dry, no rash. Neuro:   Strength and sensation are intact. Psych: Confused and sassy.  Normal affect.  Labs    CBC  Recent Labs  04/17/16 0531 04/18/16 0614  WBC 6.2 7.3  HGB 9.6* 7.8*  HCT 29.1* 22.7*  MCV 92.7 90.4  PLT 130* PENDING   Basic Metabolic Panel  Recent Labs  04/17/16 0531 04/18/16 0614  NA 138 138  K 4.3 4.7  CL 102 101  CO2 27 27  GLUCOSE 127* 105*  BUN 15 25*  CREATININE 1.72* 2.46*  CALCIUM 8.5* 8.5*   Liver Function Tests No results for input(s): AST, ALT, ALKPHOS, BILITOT, PROT, ALBUMIN in the last 72 hours. No results for input(s): LIPASE, AMYLASE in the last 72 hours. Cardiac Enzymes  Recent Labs  04/15/16 0912  TROPONINI 0.35*   BNP Invalid input(s): POCBNP D-Dimer No results for input(s): DDIMER in the last 72 hours. Hemoglobin A1C No results for input(s): HGBA1C in the last 72 hours. Fasting Lipid Panel No results for input(s): CHOL, HDL, LDLCALC, TRIG, CHOLHDL, LDLDIRECT in the last 72 hours. Thyroid Function Tests No results for input(s): TSH, T4TOTAL, T3FREE, THYROIDAB  in the last 72 hours.  Invalid input(s): FREET3  Telemetry    NSR - Personally Reviewed  ECG    NA - Personally Reviewed  Radiology    Ct Femur Right Wo Contrast  Result Date: 04/17/2016 CLINICAL DATA:  Numerous falls. Right groin hematoma first identified this morning. EXAM: CT OF THE LOWER RIGHT EXTREMITY WITHOUT CONTRAST TECHNIQUE: Multidetector CT imaging of the right lower extremity was performed according to the standard protocol. COMPARISON:  None. FINDINGS: Bones/Joint/Cartilage No acute bony or joint abnormality is identified. No focal bony lesion. Ligaments Suboptimally assessed by CT. Muscles and Tendons The vastus musculature is markedly swollen and hyperattenuating consistent with the presence of hematoma. Discrete measurement is not possible. Edematous change  and increased attenuation are worst in the vastus intermedius. No muscle or tendon tear is identified. Soft tissues Mild infiltration of subcutaneous fat is present about the right upper leg. Atherosclerotic vascular disease is noted. Small knee joint effusion is seen. IMPRESSION: Infiltrating hematoma in the vastus musculature is most notable in the vastus intermedius. Discrete measurement is not possible. No muscle or tendon tear is identified. No acute bony abnormality. Atherosclerosis. Electronically Signed   By: Inge Rise M.D.   On: 04/17/2016 09:52    Cardiac Studies   Echo 04/15/2016 Study Conclusions - Left ventricle: The cavity size was normal. There was moderate concentric hypertrophy. Systolic function was normal. The estimated ejection fraction was in the range of 60% to 65%. Wall motion was normal; there were no regional wall motion abnormalities. Features are consistent with a pseudonormal left ventricular filling pattern, with concomitant abnormal relaxation and increased filling pressure (grade 2 diastolic dysfunction). Doppler parameters are consistent with high ventricular filling pressure. - Aortic valve: Trileaflet; normal thickness, mildly calcified leaflets. - Mitral valve: Severely calcified annulus. There was mild regurgitation. - Left atrium: The atrium was moderately dilated. - Pulmonic valve: There was trivial regurgitation.  Patient Profile     76 yo male with PMH of COPD, HTN, HLD, Seizures, tobacco use, ETOH use, brain aneurysm who presented with increasing lower extremity edema, and falls.  Assessment & Plan    ATRIAL FIB WITH RVR:  NSR/sinus tach. No change in therapy.  Not an anticoagulation candidate.    ELEVATED TROPONIN:  Probable demand ischemia.  No further work up at this point.   HTN:    BP is running low but tolerating meds.  Continue current therapy.   CKD:    Creat is elevated.  Likely in part related to blood  loss anemia.   Follow.    ACUTE ON CHRONIC DIASTOLIC HF:  Currently euvolemic.       Signed, Minus Breeding, MD  04/18/2016, 7:50 AM

## 2016-04-19 LAB — BASIC METABOLIC PANEL
Anion gap: 11 (ref 5–15)
BUN: 25 mg/dL — AB (ref 6–20)
CO2: 24 mmol/L (ref 22–32)
CREATININE: 1.87 mg/dL — AB (ref 0.61–1.24)
Calcium: 8.7 mg/dL — ABNORMAL LOW (ref 8.9–10.3)
Chloride: 100 mmol/L — ABNORMAL LOW (ref 101–111)
GFR calc Af Amer: 38 mL/min — ABNORMAL LOW (ref >60)
GFR, EST NON AFRICAN AMERICAN: 33 mL/min — AB (ref >60)
Glucose, Bld: 112 mg/dL — ABNORMAL HIGH (ref 65–99)
POTASSIUM: 4.5 mmol/L (ref 3.5–5.1)
Sodium: 135 mmol/L (ref 135–145)

## 2016-04-19 LAB — TYPE AND SCREEN
ABO/RH(D): B POS
Antibody Screen: NEGATIVE
UNIT DIVISION: 0
Unit division: 0

## 2016-04-19 LAB — CBC
HEMATOCRIT: 31.9 % — AB (ref 39.0–52.0)
Hemoglobin: 11.3 g/dL — ABNORMAL LOW (ref 13.0–17.0)
MCH: 30.3 pg (ref 26.0–34.0)
MCHC: 35.4 g/dL (ref 30.0–36.0)
MCV: 85.5 fL (ref 78.0–100.0)
Platelets: 113 10*3/uL — ABNORMAL LOW (ref 150–400)
RBC: 3.73 MIL/uL — ABNORMAL LOW (ref 4.22–5.81)
RDW: 15.2 % (ref 11.5–15.5)
WBC: 9.8 10*3/uL (ref 4.0–10.5)

## 2016-04-19 MED ORDER — DILTIAZEM HCL ER COATED BEADS 180 MG PO CP24: 180.0000 mg | ORAL_CAPSULE | Freq: Every day | ORAL | 0 refills | Status: DC

## 2016-04-19 NOTE — Clinical Social Work Note (Signed)
Bed offer given to pt, [facility],pt agreeable with facility.  CSW is awaiting MD to round to determine if pt will DC today. Facility has been notified, bed available today.  CSW will continue to follow.  Athea Haley B. Joline Maxcy Clinical Social Work Dept Weekend Social Worker [phone] 11:52 AM

## 2016-04-19 NOTE — Clinical Social Work Note (Signed)
Clinical Social Worker notified patient is ready to DC, CSW facilitated patient discharge including contacting patient's family, Christell Faith [phone] and facility to confirm patient discharge plans and bed availability. Clinical information faxed to facility and pt/family agreeable with plan. CSW arranged ambulance transport via [facility] to [facility]. RN to call report [phone] prior to discharge.  Clinical Social Worker will sign off for now as social work intervention is no longer needed. Please consult [place] again if new need arises.  Temitope Griffing B. Joline Maxcy Clinical Social Work Dept Weekend Social Worker [phone] 12:53 PM

## 2016-04-19 NOTE — Progress Notes (Signed)
Patient Name: [name] Date of Encounter: 04/19/2016  Primary Cardiologist: Dr. Sundra Aland Problem List     Principal Problem:   Atrial fibrillation with RVR [facility]) Active Problems:   Seizures ([place])   Essential hypertension, benign   Hyperlipidemia LDL goal <130   Chronic diastolic heart failure (HCC)   Elevated troponin   CKD (chronic kidney disease), stage III   Fall   Alcohol abuse     Subjective   No pain, except when his right thigh is pressed.  No palpitations.    Inpatient Medications    Scheduled Meds: . atorvastatin  10 mg Oral q1800  . diltiazem  180 mg Oral Daily  . divalproex  250 mg Oral W1X  . folic acid  1 mg Oral Daily  . gatifloxacin  1 drop Left Eye QID  . ketorolac  1 drop Left Eye QID  . levETIRAcetam  500 mg Oral BID  . multivitamin with minerals  1 tablet Oral Daily  . nicotine  21 mg Transdermal Daily  . pantoprazole  40 mg Oral Daily  . polyethylene glycol  17 g Oral Daily  . senna-docusate  1 tablet Oral BID  . sodium chloride flush  3 mL Intravenous Q12H  . thiamine  100 mg Oral Daily   Continuous Infusions:  PRN Meds: sodium chloride, acetaminophen, levalbuterol, morphine injection, nitroGLYCERIN, sodium chloride flush, zolpidem   Vital Signs    Vitals:   04/18/16 1850 04/18/16 1949 04/18/16 2100 04/19/16 0458  BP: (!) 132/59 (!) 151/73 114/61 (!) 145/81  Pulse: 95 93 92 92  Resp: 14 15 (!) 21 20  Temp:   99.7 F (37.6 C) 97.7 F (36.5 C)  TempSrc:   Oral Oral  SpO2: 100% 100% 99% 100%  Weight:    120 lb (54.4 kg)    Intake/Output Summary (Last 24 hours) at 04/19/16 0931 Last data filed at 04/19/16 0546  Gross per 24 hour  Intake              792 ml  Output              575 ml  Net              217 ml   Filed Weights   04/16/16 0440 04/17/16 0334 04/19/16 0458  Weight: 121 lb 11.1 oz (55.2 kg) 120 lb 9.6 oz (54.7 kg) 120 lb (54.4 kg)    Physical Exam    GEN: NAD.  Neck:  No JVD Cardiac: Regular Rate  and Rhythm, no murmurs, rubs, or gallops.  noedema.  Radials/DP/PT 2+  and equal bilaterally.  Right thigh hematoma Respiratory:  Respirations  regular and unlabored, clear to auscultation bilaterally. GI: Soft, nontender, nondistended, BS + x 4. Skin: warm and dry, no rash. Neuro:   Strength and sensation are intact. Psych: Confused and sassy.  Normal affect.  Labs    CBC  Recent Labs  04/18/16 0614 04/18/16 2039 04/19/16 0548  WBC 7.3  --  9.8  HGB 7.8* 11.1* 11.3*  HCT 22.7* 31.8* 31.9*  MCV 90.4  --  85.5  PLT 116*  --  914*   Basic Metabolic Panel  Recent Labs  04/18/16 0614 04/19/16 0548  NA 138 135  K 4.7 4.5  CL 101 100*  CO2 27 24  GLUCOSE 105* 112*  BUN 25* 25*  CREATININE 2.46* 1.87*  CALCIUM 8.5* 8.7*   Liver Function Tests No results for input(s): AST, ALT, ALKPHOS,  BILITOT, PROT, ALBUMIN in the last 72 hours. No results for input(s): LIPASE, AMYLASE in the last 72 hours. Cardiac Enzymes No results for input(s): CKTOTAL, CKMB, CKMBINDEX, TROPONINI in the last 72 hours. BNP Invalid input(s): POCBNP D-Dimer No results for input(s): DDIMER in the last 72 hours. Hemoglobin A1C No results for input(s): HGBA1C in the last 72 hours. Fasting Lipid Panel No results for input(s): CHOL, HDL, LDLCALC, TRIG, CHOLHDL, LDLDIRECT in the last 72 hours. Thyroid Function Tests No results for input(s): TSH, T4TOTAL, T3FREE, THYROIDAB in the last 72 hours.  Invalid input(s): FREET3  Telemetry    NSR - Personally Reviewed  ECG    NA - Personally Reviewed  Radiology    Ct Femur Right Wo Contrast  Result Date: 04/17/2016 CLINICAL DATA:  Numerous falls. Right groin hematoma first identified this morning. EXAM: CT OF THE LOWER RIGHT EXTREMITY WITHOUT CONTRAST TECHNIQUE: Multidetector CT imaging of the right lower extremity was performed according to the standard protocol. COMPARISON:  None. FINDINGS: Bones/Joint/Cartilage No acute bony or joint abnormality is  identified. No focal bony lesion. Ligaments Suboptimally assessed by CT. Muscles and Tendons The vastus musculature is markedly swollen and hyperattenuating consistent with the presence of hematoma. Discrete measurement is not possible. Edematous change and increased attenuation are worst in the vastus intermedius. No muscle or tendon tear is identified. Soft tissues Mild infiltration of subcutaneous fat is present about the right upper leg. Atherosclerotic vascular disease is noted. Small knee joint effusion is seen. IMPRESSION: Infiltrating hematoma in the vastus musculature is most notable in the vastus intermedius. Discrete measurement is not possible. No muscle or tendon tear is identified. No acute bony abnormality. Atherosclerosis. Electronically Signed   By: Inge Rise M.D.   On: 04/17/2016 09:52    Cardiac Studies   Echo 04/15/2016 Study Conclusions - Left ventricle: The cavity size was normal. There was moderate concentric hypertrophy. Systolic function was normal. The estimated ejection fraction was in the range of 60% to 65%. Wall motion was normal; there were no regional wall motion abnormalities. Features are consistent with a pseudonormal left ventricular filling pattern, with concomitant abnormal relaxation and increased filling pressure (grade 2 diastolic dysfunction). Doppler parameters are consistent with high ventricular filling pressure. - Aortic valve: Trileaflet; normal thickness, mildly calcified leaflets. - Mitral valve: Severely calcified annulus. There was mild regurgitation. - Left atrium: The atrium was moderately dilated. - Pulmonic valve: There was trivial regurgitation.  Patient Profile     76 yo male with PMH of COPD, HTN, HLD, Seizures, tobacco use, ETOH use, brain aneurysm who presented with increasing lower extremity edema, and falls.  Assessment & Plan    ATRIAL FIB WITH RVR:  NSR/sinus tach. No change in therapy.  Not an  anticoagulation candidate.  Arnisha Laffoon likely need anticoagulation discussed as an outpatient. Is tachycardic today. May be reactive.  This patients CHA2DS2-VASc Score and unadjusted Ischemic Stroke Rate (% per year) is equal to 3.2 % stroke rate/year from a score of 3  Above score calculated as 1 point each if present [CHF, HTN, DM, Vascular=MI/PAD/Aortic Plaque, Age if 65-74, or Male] Above score calculated as 2 points each if present [Age > 75, or Stroke/TIA/TE]   ELEVATED TROPONIN:  Probable demand ischemia.  No further work up at this point.   HTN:    BP is running low but tolerating meds.  Continue current therapy.   CKD:    Creat is elevated.  Likely in part related to blood loss anemia.  Follow.      Signed, Darik Massing Meredith Leeds, MD  04/19/2016, 9:31 AM

## 2016-04-19 NOTE — Progress Notes (Signed)
Report called in to [facility] Nurse.

## 2016-04-19 NOTE — NC FL2 (Signed)
[place] MEDICAID FL2 LEVEL OF CARE SCREENING TOOL     IDENTIFICATION  Patient Name: [name] Birthdate: [birth year] Sex: male Admission Date (Current Location): 04/14/2016  [facility] and [place] Number:  Herbalist and Address:  [facility] [place] [facility]. [facility], [place] [address], [place], [place] [address]      Provider Number: [id number]  Attending Physician Name and Address:  Shon Millet*  Relative Name and Phone Number:  Mamie Nick) Sister [phone]    Current Level of Care: Hospital Recommended Level of Care: [place] Prior Approval Number:    Date Approved/Denied: 04/19/16 PASRR Number: [id number]  Discharge Plan: SNF    Current Diagnoses: Patient Active Problem List   Diagnosis Date Noted  . Atrial fibrillation with RVR ([place]) 04/14/2016  . Chronic diastolic heart failure ([place]) 04/14/2016  . Weakness 04/14/2016  . Elevated troponin 04/14/2016  . Fall 04/14/2016  . Alcohol abuse 04/14/2016  . CKD (chronic kidney disease), stage III   . PVD (peripheral vascular disease) ([place]) 03/07/2014  . Cognitive impairment 03/07/2014  . Essential hypertension, benign 02/02/2013  . Routine general medical examination at a health care facility 02/02/2013  . Need for prophylactic vaccination and inoculation against influenza 02/02/2013  . Hyperlipidemia LDL goal <130 02/02/2013  . Hypertensive renal disease 02/02/2013  . Seizures ([place]) 07/28/2012  . Tobacco use disorder 07/28/2012    Orientation RESPIRATION BLADDER Height & Weight     Self, Time, Situation, Place  Normal Continent Weight: 120 lb (54.4 kg) Height:     BEHAVIORAL SYMPTOMS/MOOD NEUROLOGICAL BOWEL NUTRITION STATUS      Continent  (Please see discharge summary)  AMBULATORY STATUS COMMUNICATION OF NEEDS Skin   Extensive Assist Verbally Normal                       Personal Care Assistance Level of Assistance  (P) Bathing, Feeding, Dressing Bathing Assistance:  Maximum assistance Feeding assistance: Independent Dressing Assistance: Maximum assistance     Functional Limitations Info  Sight, Hearing, Speech Sight Info: Adequate Hearing Info: Adequate Speech Info: Adequate    SPECIAL CARE FACTORS FREQUENCY  PT (By licensed PT), OT (By licensed OT)     PT Frequency: 5x wk OT Frequency: 5x wk            Contractures Contractures Info: Not present    Additional Factors Info  (P) Code Status, Allergies Code Status Info: Full Allergies Info: Penicillins           Current Medications (04/19/2016):  This is the current hospital active medication list Current Facility-Administered Medications  Medication Dose Route Frequency Provider Last Rate Last Dose  . 0.9 %  sodium chloride infusion  250 mL Intravenous PRN Ivor Costa, MD      . acetaminophen (TYLENOL) tablet 650 mg  650 mg Oral Q4H PRN Ivor Costa, MD      . atorvastatin (LIPITOR) tablet 10 mg  10 mg Oral q1800 Ivor Costa, MD   10 mg at 04/18/16 1752  . diltiazem (CARDIZEM CD) 24 hr capsule 180 mg  180 mg Oral Daily Ripudeep Krystal Eaton, MD   180 mg at 04/19/16 0948  . divalproex (DEPAKOTE) DR tablet 250 mg  250 mg Oral Q8H Ivor Costa, MD   250 mg at 04/19/16 0612  . folic acid (FOLVITE) tablet 1 mg  1 mg Oral Daily Ivor Costa, MD   1 mg at 04/19/16 0950  . gatifloxacin (ZYMAXID) 0.5 % ophthalmic  drops 1 drop  1 drop Left Eye QID Ivor Costa, MD   1 drop at 04/19/16 0950  . ketorolac (ACULAR) 0.5 % ophthalmic solution 1 drop  1 drop Left Eye QID Ivor Costa, MD   1 drop at 04/19/16 0951  . levalbuterol (XOPENEX) nebulizer solution 1.25 mg  1.25 mg Nebulization Q6H PRN Ivor Costa, MD      . levETIRAcetam (KEPPRA) tablet 500 mg  500 mg Oral BID Ivor Costa, MD   500 mg at 04/19/16 0950  . morphine 2 MG/ML injection 1 mg  1 mg Intravenous Q4H PRN Ripudeep K Rai, MD      . multivitamin with minerals tablet 1 tablet  1 tablet Oral Daily Ivor Costa, MD   1 tablet at 04/19/16 0948  . nicotine (NICODERM CQ -  dosed in mg/24 hours) patch 21 mg  21 mg Transdermal Daily Ripudeep K Rai, MD   21 mg at 04/19/16 0937  . nitroGLYCERIN (NITROSTAT) SL tablet 0.4 mg  0.4 mg Sublingual Q5 min PRN Ivor Costa, MD      . pantoprazole (PROTONIX) EC tablet 40 mg  40 mg Oral Daily Daune Perch, NP   40 mg at 04/19/16 0950  . polyethylene glycol (MIRALAX / GLYCOLAX) packet 17 g  17 g Oral Daily Ripudeep K Rai, MD   17 g at 04/19/16 0950  . senna-docusate (Senokot-S) tablet 1 tablet  1 tablet Oral BID Ripudeep Krystal Eaton, MD   1 tablet at 04/19/16 0950  . sodium chloride flush (NS) 0.9 % injection 3 mL  3 mL Intravenous Q12H Ivor Costa, MD   3 mL at 04/18/16 1000  . sodium chloride flush (NS) 0.9 % injection 3 mL  3 mL Intravenous PRN Ivor Costa, MD      . thiamine (VITAMIN B-1) tablet 100 mg  100 mg Oral Daily Ivor Costa, MD   100 mg at 04/19/16 0948  . zolpidem (AMBIEN) tablet 5 mg  5 mg Oral QHS PRN Ivor Costa, MD         Discharge Medications: Please see discharge summary for a list of discharge medications.  Relevant Imaging Results:  Relevant Lab Results:   Additional Information [id number]  Linton Flemings B, LCSWA

## 2016-04-19 NOTE — Discharge Summary (Signed)
Physician Discharge Summary  [name] ION:[id number] DOB: [birth year] DOA: 04/14/2016  PCP: [name], NP  Admit date: 04/14/2016 Discharge date: 04/19/2016  Admitted From: Home Disposition:  SNF  Recommendations for Outpatient Follow-up:  1. Follow up with PCP in 1 week  2. Follow up with Cardiology on 1/18 3. Please obtain BMP/CBC in 1 day   Home Health: No  Equipment/Devices: None   Discharge Condition: Stable CODE STATUS: Full  Diet recommendation: Heart healthy   Brief/Interim Summary: From H&P: "[name] is a 76 y.o. male with medical history significant of COPD, HTN, HLD, seizures, former smoker, alcohol abuse, s/p of brain aneurysm repair, who presents with weakness and fall. Per his sister, over the past couple of weeks, pt was noted to have increasing lower extremity edema, weakness. Pt has fallen at least twice. His sister at the bedside reports that she came to check on him this morning, noticed he was kneeling in the floor by his bed, and unable to stand. Pt denies unilateral weakness, numbness or tingling in extremities. No vision change or hearing loss. He denies syncope, chest pain, shortness rest, fever, chills. No nausea, vomiting, diarrhea or abdominal pain. EKG on arrival showed rapid A. fib, with rates in the 170s. He was started on IV Cardizem, and titrated up to 15 mg/hr with rateimprovement. Of note sister reports pt drinks ETOH on a daily basis, and drinks to the point he passes out."   Interim: During hospitalization, patient was evaluated by cardiology due to atrial fibrillation with RVR. He was initially managed with Cardizem drip IV and heparin drip IV. EEG was completed which was normal. CT right femur was obtained which showed a right thigh hematoma. Aspirin and heparin were held. He was deemed not to be a good candidate for anticoagulation due to daily alcohol use, recurrent falls. Hemoglobin remained stable. Patient did convert to normal sinus  rhythm, had sinus tachycardia which is likely reactive due to right thigh hematoma.  Subjective on day of discharge: Feeling well today. No complaints of lightheadedness or dizziness, no chest pains or shortness of breath. Denies any pain in his right thigh.  Discharge Diagnoses:  Principal Problem:   Atrial fibrillation with RVR (HCC) Active Problems:   Seizures (HCC)   Essential hypertension, benign   Hyperlipidemia LDL goal <130   Chronic diastolic heart failure (HCC)   Elevated troponin   CKD (chronic kidney disease), stage III   Fall   Alcohol abuse   Atrial fibrillation with RVR ([place]): not sure about the exact onset time. Pt has ongoing weakness over the past couple weeks, indicating onset >48 hours. This is likely triggered by alcohol abuse and possible CHF given his elevated BNP, JVD and 2+ pitting lower extremity edema.  - Patient was initially started on IV Cardizem drip, He has been now transitioned to oral Cardizem 180 mg daily. Converted to normal sinus rhythm  - Cardiology was consulted, recommended no ischemia workup at this time  - 2-D echo showed EF of 60-65% with grade 2 diastolic dysfunction - CHADSVASc 3  - Heparin drip discontinued due to right thigh hematoma - Not a candidate for anti-coagulation due to daily heavy alcohol use, frequent falls and history of seizures  - Full dose aspirin was started however currently on hold secondary to right thigh hematoma, follow up outpatient for starting antiplatelet vs anticoagulation. - Follow up cardiology arranged for 1/18  Right thigh hematoma - Hold aspirin, heparin discontinued - CT femur obtained  showed infiltrating hematoma in the vastus intermedius - Repeat CBC as outpatient   Acute blood loss anemia: Likely due to right thigh hematoma - Received 2 units packed RBCs - Hgb now stable, follow as outpatient   Acute kidney injury on chronic kidney disease stage III, baseline creatinine 1.2-1.4 - Likely due to  hypoperfusion and acute blood loss anemia due to right thigh hematoma - Creatinine improving today, follow as outpatient  Acute encephalopathy  - Likely due to alcohol withdrawals and [place] encephalopathy - CT head showed no acute intracranial process, showed underlying cerebral atrophy with chronic microvascular ischemic disease. EEG normal   HTN: - Continue Cardizem, norvasc   HLD: -Continue lipitor  Seizures:Reports he has not had a seizure in many years.  - Seizureprecaution - Continue Depakote and Keppra - EEG normal  Fall:  - CT-head negativefor acute intracranial abnormalities but showed stable atrophy. Sister reports that he has been having falling episodes, which don't sound like seizures. Has not seen a neurologist in many years.  - EEG normal - PT evaluation, recommending SNF   Elevated Trop: - Elevated troponins likely due to demand ischemia - 2-D echo showed EF of 60-65% with grade 2 diastolic dysfunction  Alcohol abuse: - Continue CIWA with PRN ativan   Discharge Instructions  Discharge Instructions    Amb referral to [facility]    Complete by:  As directed    Diet - low sodium heart healthy    Complete by:  As directed    Increase activity slowly    Complete by:  As directed      Allergies as of 04/19/2016      Reactions   Penicillins Other (See Comments)      Medication List    STOP taking these medications   amLODipine 10 MG tablet Commonly known as:  NORVASC     TAKE these medications   atorvastatin 10 MG tablet Commonly known as:  LIPITOR TAKE 1 TABLET (10 MG TOTAL) BY MOUTH DAILY.   BESIVANCE 0.6 % Susp Generic drug:  Besifloxacin HCl Place 1 drop into the left eye See admin instructions. Uses every 4-12 hours   diltiazem 180 MG 24 hr capsule Commonly known as:  CARDIZEM CD Take 1 capsule (180 mg total) by mouth daily. Start taking on:  04/20/2016   divalproex 250 MG DR tablet Commonly known as:  DEPAKOTE TAKE 1  TABLET BY MOUTH 3 TIMES A DAY FOR SEIZURES   DUREZOL 0.05 % Emul Generic drug:  Difluprednate Place 1 drop into the left eye daily.   levETIRAcetam 500 MG tablet Commonly known as:  KEPPRA Take one tablet by mouth twice daily   PROLENSA 0.07 % Soln Generic drug:  Bromfenac Sodium Place 1 drop into the left eye daily.      Follow-up Information    Lyda Jester, [name] Follow up on 04/24/2016.   Specialties:  Cardiology, Radiology Why:  at 2:30 pm for cardiology hospital followed up. Bring all of your medications with you to the appointment.  Contact information: [address] [place] [address] [phone]        [name], NP. Schedule an appointment as soon as possible for a visit in 1 week(s).   Specialty:  Geriatric Medicine Contact information: [address] [place] [address] [phone]          Allergies  Allergen Reactions  . Penicillins Other (See Comments)    Consultations:  Cardiology  Procedures/Studies: Ct Head Wo Contrast  Result Date: 04/14/2016 CLINICAL DATA:  Initial evaluation for acute fall. EXAM: CT HEAD WITHOUT CONTRAST TECHNIQUE: Contiguous axial images were obtained from the base of the skull through the vertex without intravenous contrast. COMPARISON:  Prior CT from 04/25/2012. FINDINGS: Brain: Diffuse prominence of the CSF containing spaces is compatible with generalized cerebral atrophy. Patchy and confluent hypodensity within the periventricular and deep white matter both cerebral hemispheres most consistent with chronic microvascular ischemic disease. Encephalomalacia out within the high right frontal lobe is stable. Additional focal encephalomalacia within the high left frontal lobe also stable. Chronic encephalomalacia present at the anterior left temporal pole. Aneurysm clip present at the level of the left ICA terminus. No acute intracranial hemorrhage. No evidence for acute large vessel territory  infarct. No mass lesion, midline shift or mass effect. Ventricular prominence related underlying atrophy without hydrocephalus. No extra-axial fluid collection. Vascular: No hyperdense vessel. Aneurysm clip at the left ICA terminus. Intracranial atherosclerosis noted. A a a Skull: Scalp soft tissues demonstrate no acute abnormality. Post craniotomy changes present at the left frontal calvarium. Remote craniotomy/ burr hole at the right frontal calvarium. No acute skull fracture or other abnormality. Sinuses/Orbits: Globes and orbital soft tissues within normal limits. Patient is status post lens extraction on the left. Paranasal sinuses are clear. No mastoid effusion. IMPRESSION: 1. No acute intracranial process identified. 2. Postsurgical and chronic changes as above, stable. 3. Underlying cerebral atrophy with chronic microvascular ischemic disease, also stable. Electronically Signed   By: Jeannine Boga M.D.   On: 04/14/2016 21:09   Ct Femur Right Wo Contrast  Result Date: 04/17/2016 CLINICAL DATA:  Numerous falls. Right groin hematoma first identified this morning. EXAM: CT OF THE LOWER RIGHT EXTREMITY WITHOUT CONTRAST TECHNIQUE: Multidetector CT imaging of the right lower extremity was performed according to the standard protocol. COMPARISON:  None. FINDINGS: Bones/Joint/Cartilage No acute bony or joint abnormality is identified. No focal bony lesion. Ligaments Suboptimally assessed by CT. Muscles and Tendons The vastus musculature is markedly swollen and hyperattenuating consistent with the presence of hematoma. Discrete measurement is not possible. Edematous change and increased attenuation are worst in the vastus intermedius. No muscle or tendon tear is identified. Soft tissues Mild infiltration of subcutaneous fat is present about the right upper leg. Atherosclerotic vascular disease is noted. Small knee joint effusion is seen. IMPRESSION: Infiltrating hematoma in the vastus musculature is most  notable in the vastus intermedius. Discrete measurement is not possible. No muscle or tendon tear is identified. No acute bony abnormality. Atherosclerosis. Electronically Signed   By: Inge Rise M.D.   On: 04/17/2016 09:52   Dg Chest Portable 1 View  Result Date: 04/14/2016 CLINICAL DATA:  Generalized weakness. History of seizure activity, COPD, former smoker. EXAM: PORTABLE CHEST 1 VIEW COMPARISON:  Chest x-ray of April 28, 2012 FINDINGS: The lungs are well-expanded and clear. The heart and pulmonary vascularity are normal. The mediastinum is normal in width. There is calcification in the wall of the aortic arch. There is no pleural effusion. The bony thorax exhibits no acute abnormality. IMPRESSION: COPD.  There is no active cardiopulmonary disease. Electronically Signed   By: David  Martinique M.D.   On: 04/14/2016 15:10    Echo  Study Conclusions  - Left ventricle: The cavity size was normal. There was moderate   concentric hypertrophy. Systolic function was normal. The   estimated ejection fraction was in the range of 60% to 65%. Wall   motion was normal; there  were no regional wall motion   abnormalities. Features are consistent with a pseudonormal left   ventricular filling pattern, with concomitant abnormal relaxation   and increased filling pressure (grade 2 diastolic dysfunction).   Doppler parameters are consistent with high ventricular filling   pressure. - Aortic valve: Trileaflet; normal thickness, mildly calcified   leaflets. - Mitral valve: Severely calcified annulus. There was mild   regurgitation. - Left atrium: The atrium was moderately dilated. - Pulmonic valve: There was trivial regurgitation.   Discharge Exam: Vitals:   04/19/16 0458 04/19/16 0948  BP: (!) 145/81 134/87  Pulse: 92   Resp: 20   Temp: 97.7 F (36.5 C)    Vitals:   04/18/16 1949 04/18/16 2100 04/19/16 0458 04/19/16 0948  BP: (!) 151/73 114/61 (!) 145/81 134/87  Pulse: 93 92 92   Resp:  15 (!) 21 20   Temp:  99.7 F (37.6 C) 97.7 F (36.5 C)   TempSrc:  Oral Oral   SpO2: 100% 99% 100%   Weight:   54.4 kg (120 lb)     General: Pt is alert, awake, not in acute distress Cardiovascular: regular, tachycardic, S1/S2 +, no rubs, no gallops Respiratory: CTA bilaterally, no wheezing, no rhonchi Abdominal: Soft, NT, ND, bowel sounds + Extremities: no edema, no cyanosis, no pain with palpation of right thigh     The results of significant diagnostics from this hospitalization (including imaging, microbiology, ancillary and laboratory) are listed below for reference.     Microbiology: No results found for this or any previous visit (from the past 240 hour(s)).   Labs: BNP (last 3 results)  Recent Labs  04/14/16 1612  BNP 1,610.9*   Basic Metabolic Panel:  Recent Labs Lab 04/14/16 1612 04/15/16 0912 04/16/16 0658 04/17/16 0531 04/18/16 0614 04/19/16 0548  NA 140 141 139 138 138 135  K 3.8 3.6 4.3 4.3 4.7 4.5  CL 103 102 103 102 101 100*  CO2 26 31 29 27 27 24   GLUCOSE 156* 98 85 127* 105* 112*  BUN 15 11 9 15  25* 25*  CREATININE 1.41* 1.25* 1.21 1.72* 2.46* 1.87*  CALCIUM 9.3 9.1 9.0 8.5* 8.5* 8.7*  MG 1.9  --   --   --   --   --    Liver Function Tests: No results for input(s): AST, ALT, ALKPHOS, BILITOT, PROT, ALBUMIN in the last 168 hours. No results for input(s): LIPASE, AMYLASE in the last 168 hours. No results for input(s): AMMONIA in the last 168 hours. CBC:  Recent Labs Lab 04/15/16 0912 04/16/16 0658 04/17/16 0531 04/18/16 0614 04/18/16 2039 04/19/16 0548  WBC 6.7 7.4 6.2 7.3  --  9.8  HGB 12.2* 12.3* 9.6* 7.8* 11.1* 11.3*  HCT 36.4* 36.6* 29.1* 22.7* 31.8* 31.9*  MCV 92.2 93.4 92.7 90.4  --  85.5  PLT 122* 120* 130* 116*  --  113*   Cardiac Enzymes:  Recent Labs Lab 04/14/16 2100 04/15/16 0211 04/15/16 0912  TROPONINI 0.35* 0.46* 0.35*   BNP: Invalid input(s): POCBNP CBG: No results for input(s): GLUCAP in the last 168  hours. D-Dimer No results for input(s): DDIMER in the last 72 hours. Hgb A1c No results for input(s): HGBA1C in the last 72 hours. Lipid Profile No results for input(s): CHOL, HDL, LDLCALC, TRIG, CHOLHDL, LDLDIRECT in the last 72 hours. Thyroid function studies No results for input(s): TSH, T4TOTAL, T3FREE, THYROIDAB in the last 72 hours.  Invalid input(s): FREET3 Anemia work up No results for input(s):  VITAMINB12, FOLATE, FERRITIN, TIBC, IRON, RETICCTPCT in the last 72 hours. Urinalysis No results found for: COLORURINE, APPEARANCEUR, LABSPEC, [place], GLUCOSEU, HGBUR, BILIRUBINUR, KETONESUR, PROTEINUR, UROBILINOGEN, NITRITE, LEUKOCYTESUR Sepsis Labs Invalid input(s): PROCALCITONIN,  WBC,  LACTICIDVEN Microbiology No results found for this or any previous visit (from the past 240 hour(s)).   Time coordinating discharge: Over 30 minutes  SIGNED:  Dessa Phi, DO [facility] Pager [phone]  If 7PM-7AM, please contact night-coverage [email or web address] Password [facility] 04/19/2016, 11:46 AM

## 2016-04-21 ENCOUNTER — Telehealth: Payer: Self-pay

## 2016-04-21 NOTE — Telephone Encounter (Signed)
This is a patient of [place], who was admitted to [facility] after hospitalization. [facility] F/U is needed. Hospital discharge from [facility] on 04/19/16.

## 2016-04-24 ENCOUNTER — Ambulatory Visit: Payer: Medicare Other | Admitting: Cardiology

## 2016-04-25 ENCOUNTER — Encounter (HOSPITAL_COMMUNITY): Payer: Self-pay | Admitting: Emergency Medicine

## 2016-04-25 ENCOUNTER — Emergency Department (HOSPITAL_COMMUNITY)
Admission: EM | Admit: 2016-04-25 | Discharge: 2016-04-26 | Disposition: A | Discharge status: Other Acute Inpatient Hospital | Payer: Medicare Other | Attending: Emergency Medicine | Admitting: Emergency Medicine

## 2016-04-25 DIAGNOSIS — J449 Chronic obstructive pulmonary disease, unspecified: Secondary | ICD-10-CM | POA: Diagnosis not present

## 2016-04-25 DIAGNOSIS — N183 Chronic kidney disease, stage 3 (moderate): Secondary | ICD-10-CM | POA: Insufficient documentation

## 2016-04-25 DIAGNOSIS — I130 Hypertensive heart and chronic kidney disease with heart failure and stage 1 through stage 4 chronic kidney disease, or unspecified chronic kidney disease: Secondary | ICD-10-CM | POA: Diagnosis not present

## 2016-04-25 DIAGNOSIS — R21 Rash and other nonspecific skin eruption: Secondary | ICD-10-CM

## 2016-04-25 DIAGNOSIS — G9340 Encephalopathy, unspecified: Secondary | ICD-10-CM | POA: Diagnosis not present

## 2016-04-25 DIAGNOSIS — I5032 Chronic diastolic (congestive) heart failure: Secondary | ICD-10-CM | POA: Insufficient documentation

## 2016-04-25 DIAGNOSIS — R238 Other skin changes: Secondary | ICD-10-CM | POA: Insufficient documentation

## 2016-04-25 DIAGNOSIS — Z87891 Personal history of nicotine dependence: Secondary | ICD-10-CM | POA: Diagnosis not present

## 2016-04-25 DIAGNOSIS — Z79899 Other long term (current) drug therapy: Secondary | ICD-10-CM | POA: Insufficient documentation

## 2016-04-25 LAB — URINALYSIS, ROUTINE W REFLEX MICROSCOPIC
BILIRUBIN URINE: NEGATIVE
Glucose, UA: NEGATIVE mg/dL
HGB URINE DIPSTICK: NEGATIVE
Ketones, ur: 5 mg/dL — AB
Leukocytes, UA: NEGATIVE
Nitrite: NEGATIVE
Protein, ur: 30 mg/dL — AB
SPECIFIC GRAVITY, URINE: 1.017 (ref 1.005–1.030)
pH: 5 (ref 5.0–8.0)

## 2016-04-25 LAB — CBC WITH DIFFERENTIAL/PLATELET
Basophils Absolute: 0.1 10*3/uL (ref 0.0–0.1)
Basophils Relative: 1 %
EOS PCT: 3 %
Eosinophils Absolute: 0.4 10*3/uL (ref 0.0–0.7)
HEMATOCRIT: 26.2 % — AB (ref 39.0–52.0)
HEMOGLOBIN: 8.7 g/dL — AB (ref 13.0–17.0)
LYMPHS ABS: 3 10*3/uL (ref 0.7–4.0)
Lymphocytes Relative: 21 %
MCH: 30 pg (ref 26.0–34.0)
MCHC: 33.2 g/dL (ref 30.0–36.0)
MCV: 90.3 fL (ref 78.0–100.0)
MONOS PCT: 12 %
Monocytes Absolute: 1.7 10*3/uL — ABNORMAL HIGH (ref 0.1–1.0)
Neutro Abs: 9.3 10*3/uL — ABNORMAL HIGH (ref 1.7–7.7)
Neutrophils Relative %: 63 %
Platelets: 180 10*3/uL (ref 150–400)
RBC: 2.9 MIL/uL — ABNORMAL LOW (ref 4.22–5.81)
RDW: 15.2 % (ref 11.5–15.5)
WBC: 14.5 10*3/uL — ABNORMAL HIGH (ref 4.0–10.5)

## 2016-04-25 LAB — COMPREHENSIVE METABOLIC PANEL
ALK PHOS: 81 U/L (ref 38–126)
ALT: 30 U/L (ref 17–63)
ANION GAP: 7 (ref 5–15)
AST: 33 U/L (ref 15–41)
Albumin: 2.1 g/dL — ABNORMAL LOW (ref 3.5–5.0)
BILIRUBIN TOTAL: 0.8 mg/dL (ref 0.3–1.2)
BUN: 23 mg/dL — ABNORMAL HIGH (ref 6–20)
CALCIUM: 7.9 mg/dL — AB (ref 8.9–10.3)
CO2: 23 mmol/L (ref 22–32)
Chloride: 108 mmol/L (ref 101–111)
Creatinine, Ser: 1.36 mg/dL — ABNORMAL HIGH (ref 0.61–1.24)
GFR calc non Af Amer: 49 mL/min — ABNORMAL LOW (ref >60)
GFR, EST AFRICAN AMERICAN: 56 mL/min — AB (ref >60)
GLUCOSE: 109 mg/dL — AB (ref 65–99)
Potassium: 4.4 mmol/L (ref 3.5–5.1)
Sodium: 138 mmol/L (ref 135–145)
TOTAL PROTEIN: 5.5 g/dL — AB (ref 6.5–8.1)

## 2016-04-25 LAB — CK: CK TOTAL: 108 U/L (ref 49–397)

## 2016-04-25 LAB — URINALYSIS, MICROSCOPIC (REFLEX)
RBC / HPF: NONE SEEN RBC/hpf (ref 0–5)
WBC, UA: NONE SEEN WBC/hpf (ref 0–5)

## 2016-04-25 LAB — I-STAT CG4 LACTIC ACID, ED: Lactic Acid, Venous: 0.95 mmol/L (ref 0.5–1.9)

## 2016-04-25 MED ORDER — SODIUM CHLORIDE 0.9 % IV BOLUS (SEPSIS)
1000.0000 mL | Freq: Once | INTRAVENOUS | Status: AC
  Administered 2016-04-25: 1000 mL via INTRAVENOUS

## 2016-04-25 NOTE — ED Provider Notes (Signed)
[facility] DEPT Provider Note   CSN: [id number] Arrival date & time: 04/25/16  1804     History   Chief Complaint Chief Complaint  Patient presents with  . Rash    HPI [name] is a 76 y.o. male.  HPI   Presents with c/o of Rash, leukocytosis, fevers for 4 days per [facility] Fevers, low grade, 100.2 being treated with vancomycin for 4 days however no change Since initially admit to the hospital has had confusion, has been worse over last few days Since discharge to [place] more confusion, sleepier  Rash initially was not noticeable on the 1/14 Skin started by getting red, facility tried benadryl 1/15, then started vancomycin.  On Tuesday redness all over body per sister, arms, chest,  then turned into blisters. Noted blisters bilateral legs, back of arms. Has not been complaining of mouth or throat pain.  Noticed the blisters on Monday-Tuesday of this week, noticed the peeling of skin today over chest, face.   Patient reports pain "all over", and pain with all movements.   On depakote and lamictal for seizures, no recent changes   Past Medical History:  Diagnosis Date  . Alcohol abuse   . Aneurysm ([place])   . CKD (chronic kidney disease), stage III   . COPD (chronic obstructive pulmonary disease) ([place])   . Hyperchloremia   . Hypercholesteremia   . Hyperpotassemia   . Hypertension   . Hypertensive renal disease, benign   . Seizures ([place])    has not had a seizure in 15 yrs  . Tobacco use     Patient Active Problem List   Diagnosis Date Noted  . Atrial fibrillation with RVR ([place]) 04/14/2016  . Chronic diastolic heart failure ([place]) 04/14/2016  . Weakness 04/14/2016  . Elevated troponin 04/14/2016  . Fall 04/14/2016  . Alcohol abuse 04/14/2016  . CKD (chronic kidney disease), stage III   . PVD (peripheral vascular disease) ([place]) 03/07/2014  . Cognitive impairment 03/07/2014  . Essential hypertension, benign 02/02/2013  . Routine general  medical examination at a health care facility 02/02/2013  . Need for prophylactic vaccination and inoculation against influenza 02/02/2013  . Hyperlipidemia LDL goal <130 02/02/2013  . Hypertensive renal disease 02/02/2013  . Seizures ([place]) 07/28/2012  . Tobacco use disorder 07/28/2012    Past Surgical History:  Procedure Laterality Date  . 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, 1996  . EYE SURGERY     December 2017       Home Medications    Prior to Admission medications   Medication Sig Start Date End Date Taking? Authorizing Provider  atorvastatin (LIPITOR) 10 MG tablet TAKE 1 TABLET (10 MG TOTAL) BY MOUTH DAILY. 12/24/15  Yes [name], NP  BESIVANCE 0.6 % SUSP Place 1 drop into the left eye See admin instructions. Uses every 4-12 hours 03/18/16  Yes Historical Provider, MD  diltiazem (CARDIZEM CD) 180 MG 24 hr capsule Take 1 capsule (180 mg total) by mouth daily. 04/20/16  Yes Jennifer Chahn-Yang Choi, DO  divalproex (DEPAKOTE) 250 MG DR tablet TAKE 1 TABLET BY MOUTH 3 TIMES A DAY FOR SEIZURES 01/28/16  Yes [name], NP  DUREZOL 0.05 % EMUL Place 1 drop into the left eye daily. 03/18/16  Yes Historical Provider, MD  levETIRAcetam (KEPPRA) 500 MG tablet Take one tablet by mouth twice daily 12/24/15  Yes [name], NP  LORazepam (ATIVAN) 0.5 MG tablet Take 0.5 mg by mouth every 12 (twelve)  hours.   Yes Historical Provider, MD  PROLENSA 0.07 % SOLN Place 1 drop into the left eye daily. 03/18/16  Yes Historical Provider, MD  vancomycin 1,000 mg in sodium chloride 0.9 % 250 mL Inject 1,000 mg into the vein daily. Started on 04-22-2016   Yes Historical Provider, MD    Family History Family History  Problem Relation Age of Onset  . Cancer Mother   . Congestive Heart Failure Father   . Alzheimer's disease Sister   . Congestive Heart Failure Sister   . Clotting disorder Sister   . Congestive Heart Failure Brother   . Clotting disorder Sister     Social  History Social History  Substance Use Topics  . Smoking status: Former Smoker    Years: 56.00  . Smokeless tobacco: Never Used     Comment: On average about 6 cig daily   . Alcohol use 0.0 oz/week     Comment: 2-3 beer a day     Allergies   Penicillins   Review of Systems Review of Systems  Unable to perform ROS: Mental status change  Constitutional: Positive for fever.  Gastrointestinal: Negative for nausea and vomiting.  Genitourinary: Negative for dysuria.  Musculoskeletal: Positive for arthralgias and myalgias.  Skin: Positive for rash.  Neurological: Positive for headaches.  Psychiatric/Behavioral: Positive for confusion.     Physical Exam Updated Vital Signs BP 169/71   Pulse 95   Temp 98.4 F (36.9 C)   Resp 22   SpO2 100%   Physical Exam  Constitutional: He appears well-developed and well-nourished. He appears ill. No distress.  HENT:  Head: Normocephalic and atraumatic.  Left buccal mucosa with apthous ulcerx1  Eyes: Conjunctivae and EOM are normal. Right eye exhibits exudate. Left eye exhibits exudate.  Bilateral eyes with crusting of eyes closed, query very mild injection bilateral eyes  Neck: Neck rigidity present. Decreased range of motion present. No Brudzinski's sign and no Kernig's sign noted.  Cardiovascular: Normal rate, regular rhythm, normal heart sounds and intact distal pulses.  Exam reveals no gallop and no friction rub.   No murmur heard. Pulmonary/Chest: Effort normal and breath sounds normal. No respiratory distress. He has no wheezes. He has no rales.  Abdominal: Soft. He exhibits no distension. There is no tenderness. There is no guarding.  Musculoskeletal: He exhibits no edema.  Diffuse tenderness, left hand clenched  Neurological: He is alert.  orientedx2 Understands commands but does not follow them, swearing at provider,  refuses to open mouth voluntary, move right arm for exam. Question spasticity however pt cooperation limiting  exam.   Pt reports "Leave me alone! I just want to go home"  Skin: Skin is warm and dry. He is not diaphoretic.  Face and neck with dry, scaling, peeling rash, skin thickening to face, oily peeling skin at hairline, neck with linear petechial rash in distribution of gown string, oily peeling skin, superficial, one small area of ulceration anterior. Chest with a few lesions dry skin peeling, superficial peeling, some in circular lesions, some with centra scabs or central purpura. Bilateral distal arms with erythema, warmth, tenderness. Bullae present on back of arm on right. PICC line site right arm with ulceration surrounding tegaderm dressing Legs with distal petechia, bilateral medial thighs with bullous lesions, ulceration. Also right lower medial leg with bullae  Nursing note and vitals reviewed.    ED Treatments / Results  Labs (all labs ordered are listed, but only abnormal results are displayed) Labs Reviewed  CBC  WITH DIFFERENTIAL/PLATELET - Abnormal; Notable for the following:       Result Value   WBC 14.5 (*)    RBC 2.90 (*)    Hemoglobin 8.7 (*)    HCT 26.2 (*)    Neutro Abs 9.3 (*)    Monocytes Absolute 1.7 (*)    All other components within normal limits  COMPREHENSIVE METABOLIC PANEL - Abnormal; Notable for the following:    Glucose, Bld 109 (*)    BUN 23 (*)    Creatinine, Ser 1.36 (*)    Calcium 7.9 (*)    Total Protein 5.5 (*)    Albumin 2.1 (*)    GFR calc non Af Amer 49 (*)    GFR calc Af Amer 56 (*)    All other components within normal limits  URINALYSIS, ROUTINE W REFLEX MICROSCOPIC - Abnormal; Notable for the following:    APPearance CLOUDY (*)    Ketones, ur 5 (*)    Protein, ur 30 (*)    All other components within normal limits  URINALYSIS, MICROSCOPIC (REFLEX) - Abnormal; Notable for the following:    Bacteria, UA RARE (*)    Squamous Epithelial / LPF 0-5 (*)    All other components within normal limits  CULTURE, BLOOD (ROUTINE X 2)  CULTURE,  BLOOD (ROUTINE X 2)  URINE CULTURE  CK  I-STAT CG4 LACTIC ACID, ED  I-STAT CG4 LACTIC ACID, ED    EKG  EKG Interpretation None       Radiology No results found.  Procedures Procedures (including critical care time)  Medications Ordered in ED Medications  sodium chloride 0.9 % bolus 1,000 mL (0 mLs Intravenous Stopped 04/25/16 2217)     Initial Impression / Assessment and Plan / ED Course  I have reviewed the triage vital signs and the nursing notes.  Pertinent labs & imaging results that were available during my care of the patient were reviewed by me and considered in my medical decision making (see chart for details).     76 year old male with a history of prior intracranial aneurysm, seizure disorder on Keppra and Depakote, hypertension, hyperlipidemia, etoh with recent admission for generalized weakness with finding of atrial fibrillation with RVR, chronic diastolic heart failure, now residing in a rehabilitation facility presents from rehab facility with concern for rash, leukocytosis, fever without response to 4 days of vancomycin. Sent over for r/o [facility].    Patient with rash with areas of bullae, ulceration, dry skin peeling. Doubt SSSS/cellulitis given no improvement with vancomycin.  Doubt meningitis given pt with more diffuse pain with movement on exam (no specific pain in neck, no reflexive Kernigs nor Brudzinski),, duration of 4 days of rash, afebrile in ED, and while petechia present in small area, overall rash with other features not typical of meningitis. Small area of petechia may also be secondary to pt recent thrombocytopenia, in setting of trauma. Possible bullous pemphigoid, drug related pemphigus, SJS, other drug rash.  No obvious mucous membrane involvement.  Discussed with Dr. Lyman Speller of Dermatology, sent pictures with family consent in order to care for patient, and rash of varying types noted by Dr. Lyman Speller, overall given bullae localized, do not  feel he needs ICU admission for SJS.Marland Kitchen  Patient afebrile with normal vital signs, normal lactic acid in the ED. Given concern for possible drug reaction and patient without signs of sepsis will hold on further abx therapy. Discussed with hospitalist at [facility] initially, however given dermatology not available at [facility] will  transfer to [facility] for admission, further dermatologic evaluation and monitoring in setting of possible evolving drug reaction verus possible vitamin deficiency.  Cardizem new drug and question contribution.    Pt with encephalopathy during last admission and family reports he has not been the same since and over last few days has been more sleepy. Pt will not cooperate in exam at this time but no concerns expressed of focal neuro deficits. Pt afebrile in ED, sleepy, but easily awaken and complaining of diffuse pain.   Pt transferred to [facility] for further evaluation and treatment as well as inpt dermatology evaluation.      Final Clinical Impressions(s) / ED Diagnoses   Final diagnoses:  Rash  Encephalopathy  Bullae    New Prescriptions Discharge Medication List as of 04/26/2016  1:34 AM       Gareth Morgan, MD 04/26/16 9144

## 2016-04-25 NOTE — ED Triage Notes (Signed)
Pt to ER by [facility] from [facility] for evaluation of full body peeling rash. Onset 6 days ago on the 13th, has been being treated with Vancomycin at facility for 4 days without improvement. Pt is alert at baseline per staff. Temp 100.2, HR 100, BP 144/62, CBG 140.

## 2016-04-25 NOTE — ED Notes (Signed)
[facility] Patient Placement office called, stating that patient has an available bed on the 10th floor, room 56. Number to call report: [phone] or [phone].

## 2016-04-26 DIAGNOSIS — R21 Rash and other nonspecific skin eruption: Secondary | ICD-10-CM | POA: Diagnosis present

## 2016-04-26 DIAGNOSIS — Z87891 Personal history of nicotine dependence: Secondary | ICD-10-CM | POA: Diagnosis not present

## 2016-04-26 DIAGNOSIS — I130 Hypertensive heart and chronic kidney disease with heart failure and stage 1 through stage 4 chronic kidney disease, or unspecified chronic kidney disease: Secondary | ICD-10-CM | POA: Diagnosis not present

## 2016-04-26 DIAGNOSIS — G9340 Encephalopathy, unspecified: Secondary | ICD-10-CM | POA: Diagnosis not present

## 2016-04-26 DIAGNOSIS — I5032 Chronic diastolic (congestive) heart failure: Secondary | ICD-10-CM | POA: Diagnosis not present

## 2016-04-26 DIAGNOSIS — R238 Other skin changes: Secondary | ICD-10-CM | POA: Diagnosis not present

## 2016-04-26 DIAGNOSIS — N183 Chronic kidney disease, stage 3 (moderate): Secondary | ICD-10-CM | POA: Diagnosis not present

## 2016-04-26 DIAGNOSIS — Z79899 Other long term (current) drug therapy: Secondary | ICD-10-CM | POA: Diagnosis not present

## 2016-04-26 DIAGNOSIS — J449 Chronic obstructive pulmonary disease, unspecified: Secondary | ICD-10-CM | POA: Diagnosis not present

## 2016-04-26 NOTE — ED Notes (Signed)
MD at bedside. 

## 2016-04-27 LAB — URINE CULTURE: Culture: NO GROWTH

## 2016-04-30 LAB — CULTURE, BLOOD (ROUTINE X 2)
Culture: NO GROWTH
Culture: NO GROWTH

## 2016-05-05 ENCOUNTER — Ambulatory Visit: Payer: Medicare Other | Admitting: Nurse Practitioner

## 2016-05-08 ENCOUNTER — Encounter: Payer: Self-pay | Admitting: Cardiology

## 2016-05-08 ENCOUNTER — Ambulatory Visit (INDEPENDENT_AMBULATORY_CARE_PROVIDER_SITE_OTHER): Payer: Medicare Other | Admitting: Cardiology

## 2016-05-08 ENCOUNTER — Encounter (INDEPENDENT_AMBULATORY_CARE_PROVIDER_SITE_OTHER): Payer: Self-pay

## 2016-05-08 VITALS — BP 132/58 | HR 59 | Ht 66.0 in

## 2016-05-08 DIAGNOSIS — I480 Paroxysmal atrial fibrillation: Secondary | ICD-10-CM | POA: Diagnosis not present

## 2016-05-08 NOTE — Patient Instructions (Signed)
Medication Instructions:  Your physician recommends that you continue on your current medications as directed. Please refer to the Current Medication list given to you today.   Labwork: NONE  Testing/Procedures: NONE  Follow-Up: Your physician recommends that you schedule a follow-up appointment in: 3 MONTHS WITH Dr. Radford Pax.   Any Other Special Instructions Will Be Listed Below (If Applicable).  1- Maintain a low sodium diet.  2- Refrain from drinking alcohol.  3- Elevate legs.  4- Continue using compression stockings.    If you need a refill on your cardiac medications before your next appointment, please call your pharmacy.

## 2016-05-08 NOTE — Progress Notes (Signed)
05/08/2016 [name]   [birth year]  [id number]  Primary Physician [name], NP Primary Cardiologist: Dr. Radford Pax    Reason for Visit/CC: [facility] f/u for Atrial Fibrillation   HPI:  [name] is a 76 y/o AA male with PMH of COPD, HTN, HLD, Seizures, tobacco use, ETOH use, brain aneurysm s/p cerebral aneurysm clipping over 20 years ago. He was also recently diagnosed with atrial fibrillation. He presents to clinic today for post hospital f/u.   He was recently admitted to [facility] 04/15/16. He initially presented for evaluation for weakness resulting in a fall at home. On arrival, he was noted to be in atrial fibrillation w/ RVR. He was asymptomatic. He was placed on IV cardizem and converted to NSR. He was also started on IV heparin however he was felt not a candidate for long term oral anticoaguation given his heavy ETOH use and bleed risk. Per hospital records, his family informed that he "drinks ETOH on a daily basis, and drinks to the point he passes out."   Troponins were mildly abnormal x 3 at 0.35>>0.46>>0.2m, felt to be demand ischemia from rapid afib. He denied CP. 2D echo showed normal LVEF of 60-65% with grade 2DD and no WMA. No further ischemic w/u was recommended. He was discharged to a SNF for rehab and instructed to continue PO Cardizem.  Per family, he had an allergic reaction with a skin rash and blisters on his lower extremities. He was evaluated at [facility] and it was felt that his reaction was related to Diltiazem. This was discontinued and he was placed on Coreg, 6.25 mg BID. His condition improved. He is tolerating Coreg well w/o side effects. He denies any palpitations. Physical exam reveals RRR. HR is controlled in the upper 50s. BP is well controlled. He also denies CP and dyspnea. He continues to have mild LEE on exam but was started on HCTZ at [facility]. He is also wearing compression stockings. His rehab is not progressing as well as he had hoped. He is still  unable to walk.    Current Meds  Medication Sig  . atorvastatin (LIPITOR) 10 MG tablet TAKE 1 TABLET (10 MG TOTAL) BY MOUTH DAILY.  . BESIVANCE 0.6 % SUSP Place 1 drop into the left eye See admin instructions. Uses every 4-12 hours  . diltiazem (CARDIZEM CD) 180 MG 24 hr capsule Take 1 capsule (180 mg total) by mouth daily.  . divalproex (DEPAKOTE) 250 MG DR tablet TAKE 1 TABLET BY MOUTH 3 TIMES A DAY FOR SEIZURES  . DUREZOL 0.05 % EMUL Place 1 drop into the left eye daily.  Marland Kitchen levETIRAcetam (KEPPRA) 500 MG tablet Take one tablet by mouth twice daily  . LORazepam (ATIVAN) 0.5 MG tablet Take 0.5 mg by mouth every 12 (twelve) hours.  Marland Kitchen PROLENSA 0.07 % SOLN Place 1 drop into the left eye daily.  . vancomycin 1,000 mg in sodium chloride 0.9 % 250 mL Inject 1,000 mg into the vein daily. Started on 04-22-2016   Allergies  Allergen Reactions  . Penicillins Other (See Comments)   Past Medical History:  Diagnosis Date  . Alcohol abuse   . Aneurysm ([place])   . CKD (chronic kidney disease), stage III   . COPD (chronic obstructive pulmonary disease) ([place])   . Hyperchloremia   . Hypercholesteremia   . Hyperpotassemia   . Hypertension   . Hypertensive renal disease, benign   . Seizures ([place])    has not had a seizure  in 15 yrs  . Tobacco use    Family History  Problem Relation Age of Onset  . Cancer Mother   . Congestive Heart Failure Father   . Alzheimer's disease Sister   . Congestive Heart Failure Sister   . Clotting disorder Sister   . Congestive Heart Failure Brother   . Clotting disorder Sister    Past Surgical History:  Procedure Laterality Date  . 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, 1996  . EYE SURGERY     December 2017   Social History   Social History  . Marital status: Single    Spouse name: N/A  . Number of children: N/A  . Years of education: N/A   Occupational History  . Not on file.   Social History Main Topics  . Smoking status: Former Smoker    Years: 56.00    . Smokeless tobacco: Never Used     Comment: On average about 6 cig daily   . Alcohol use 0.0 oz/week     Comment: 2-3 beer a day  . Drug use: No  . Sexual activity: Not on file   Other Topics Concern  . Not on file   Social History Narrative  . No narrative on file     Review of Systems: General: negative for chills, fever, night sweats or weight changes.  Cardiovascular: negative for chest pain, dyspnea on exertion, edema, orthopnea, palpitations, paroxysmal nocturnal dyspnea or shortness of breath Dermatological: negative for rash Respiratory: negative for cough or wheezing Urologic: negative for hematuria Abdominal: negative for nausea, vomiting, diarrhea, bright red blood per rectum, melena, or hematemesis Neurologic: negative for visual changes, syncope, or dizziness All other systems reviewed and are otherwise negative except as noted above.   Physical Exam:  Blood pressure (!) 132/58, pulse (!) 59, height 5\' 6"  (1.676 m), SpO2 97 %.  General appearance: alert, cooperative, no distress and frail appearing, in wheelchair Neck: no carotid bruit and no JVD Lungs: clear to auscultation bilaterally Heart: regular rate and rhythm, S1, S2 normal, no murmur, click, rub or gallop Extremities: 1+ bilateral LEE pitting edema Pulses: 2+ and symmetric Skin: Skin color, texture, turgor normal. No rashes or lesions Neurologic: Grossly normal  EKG not performed.   ASSESSMENT AND PLAN:   1.PAF: likely related to heavy ETOH use. He is maintaining NSR on Coreg. Cardizem was discontinued due to allergic reaction. HR and BP are both well controlled. He is not a candidate for a/c given h/o heavy ETOH use and fall risk. 2D echo showed normal LVEF and no wall motion abnormalities. Avoidance of ETOH strongly encouraged.   2. Chronic Diastolic HF: echo showed normal systolic function but grade 2DD. He had mild bilateral LEE pitting edema on exam and was recently started on HCTZ at [facility].  Continue diuretic + elevation and compression stockings. Low sodium diet advised. Continue BB therapy with Coreg. BP is well controlled.   F/u with Dr. Radford Pax in 3 months.   Janeth Terry [name] 05/08/2016 2:05 PM

## 2016-06-17 ENCOUNTER — Ambulatory Visit: Payer: Medicare Other | Admitting: Nurse Practitioner

## 2016-06-19 ENCOUNTER — Ambulatory Visit (INDEPENDENT_AMBULATORY_CARE_PROVIDER_SITE_OTHER): Payer: Medicare Other | Admitting: Nurse Practitioner

## 2016-06-19 ENCOUNTER — Encounter: Payer: Self-pay | Admitting: Nurse Practitioner

## 2016-06-19 VITALS — BP 168/72 | HR 58 | Temp 97.3°F | Resp 18 | Ht 66.0 in | Wt 126.4 lb

## 2016-06-19 DIAGNOSIS — N189 Chronic kidney disease, unspecified: Secondary | ICD-10-CM | POA: Diagnosis not present

## 2016-06-19 DIAGNOSIS — R569 Unspecified convulsions: Secondary | ICD-10-CM | POA: Diagnosis not present

## 2016-06-19 DIAGNOSIS — I4891 Unspecified atrial fibrillation: Secondary | ICD-10-CM

## 2016-06-19 DIAGNOSIS — I129 Hypertensive chronic kidney disease with stage 1 through stage 4 chronic kidney disease, or unspecified chronic kidney disease: Secondary | ICD-10-CM | POA: Diagnosis not present

## 2016-06-19 DIAGNOSIS — L89620 Pressure ulcer of left heel, unstageable: Secondary | ICD-10-CM | POA: Diagnosis not present

## 2016-06-19 DIAGNOSIS — R634 Abnormal weight loss: Secondary | ICD-10-CM | POA: Diagnosis not present

## 2016-06-19 DIAGNOSIS — F1010 Alcohol abuse, uncomplicated: Secondary | ICD-10-CM | POA: Diagnosis not present

## 2016-06-19 DIAGNOSIS — D631 Anemia in chronic kidney disease: Secondary | ICD-10-CM | POA: Diagnosis not present

## 2016-06-19 LAB — CBC WITH DIFFERENTIAL/PLATELET
BASOS ABS: 55 {cells}/uL (ref 0–200)
Basophils Relative: 1 %
EOS PCT: 2 %
Eosinophils Absolute: 110 cells/uL (ref 15–500)
HCT: 30.2 % — ABNORMAL LOW (ref 38.5–50.0)
Hemoglobin: 9.5 g/dL — ABNORMAL LOW (ref 13.2–17.1)
LYMPHS ABS: 2695 {cells}/uL (ref 850–3900)
Lymphocytes Relative: 49 %
MCH: 28.8 pg (ref 27.0–33.0)
MCHC: 31.5 g/dL — AB (ref 32.0–36.0)
MCV: 91.5 fL (ref 80.0–100.0)
MONOS PCT: 11 %
MPV: 10.1 fL (ref 7.5–12.5)
Monocytes Absolute: 605 cells/uL (ref 200–950)
NEUTROS ABS: 2035 {cells}/uL (ref 1500–7800)
NEUTROS PCT: 37 %
Platelets: 218 10*3/uL (ref 140–400)
RBC: 3.3 MIL/uL — AB (ref 4.20–5.80)
RDW: 15.2 % — ABNORMAL HIGH (ref 11.0–15.0)
WBC: 5.5 10*3/uL (ref 3.8–10.8)

## 2016-06-19 LAB — COMPLETE METABOLIC PANEL WITH GFR
ALBUMIN: 3.3 g/dL — AB (ref 3.6–5.1)
ALK PHOS: 64 U/L (ref 40–115)
ALT: 6 U/L — ABNORMAL LOW (ref 9–46)
AST: 13 U/L (ref 10–35)
BUN: 16 mg/dL (ref 7–25)
CO2: 29 mmol/L (ref 20–31)
Calcium: 9 mg/dL (ref 8.6–10.3)
Chloride: 107 mmol/L (ref 98–110)
Creat: 1.1 mg/dL (ref 0.70–1.18)
GFR, EST NON AFRICAN AMERICAN: 64 mL/min (ref >=60)
GFR, Est African American: 74 mL/min (ref >=60)
GLUCOSE: 84 mg/dL (ref 65–99)
POTASSIUM: 4.5 mmol/L (ref 3.5–5.3)
SODIUM: 144 mmol/L (ref 135–146)
Total Bilirubin: 0.3 mg/dL (ref 0.2–1.2)
Total Protein: 6.7 g/dL (ref 6.1–8.1)

## 2016-06-19 NOTE — Progress Notes (Signed)
Careteam: Patient Care Team: [name], NP as PCP - General (Nurse Practitioner)  Advanced Directive information Does Patient Have a Medical Advance Directive?: No  Allergies  Allergen Reactions  . Diltiazem Rash and Other (See Comments)    Blisters  . Penicillins Other (See Comments)    Chief Complaint  Patient presents with  . Transitions Of Care    Patient was discharged from [facility] 06/06/16; Pt was there for approximetely 60 days.Has open wound on left ankle from where ankle was opened to release fluid. Wound is extremely painful. Needs referral to wound care.      HPI: Patient is a 76 y.o. male seen in the office today to follow up from nursing facility.  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   He was at [facility] for about 60 days after hospitalization  Pt is here with cousin.  She reports his memory has worsened. Him and his sister who is total care live together. They have 24/7 caregivers.  Eating well. Wants to eat everything.  Not currently drinking.  Still smoking.  Pressure ulcer noted to left heel, home health nursing using calcium alginate with silver and hydrogel 3 times a week. Foul odor.   Review of Systems:  Review of Systems  Constitutional: Negative for activity change, appetite change, chills, fatigue and fever.  HENT: Negative for congestion and ear pain.   Eyes: Negative for visual disturbance.       Wears corrective lenses  Respiratory: Negative for cough and shortness of breath.   Cardiovascular: Negative for chest pain, palpitations and leg swelling.  Gastrointestinal:  Negative for abdominal pain and constipation.  Genitourinary: Negative for dysuria.  Musculoskeletal: Negative for arthralgias.  Skin: Positive for wound. Negative for color change, pallor and rash.  Neurological: Negative for dizziness, seizures (hx of seizures but none recently), weakness and light-headedness.  Hematological: Negative for adenopathy.  Psychiatric/Behavioral: Negative for agitation, behavioral problems, self-injury and sleep disturbance. The patient is not nervous/anxious.     Past Medical History:  Diagnosis Date  . Alcohol abuse   . Aneurysm ([place])   . CKD (chronic kidney disease), stage III   . COPD (chronic obstructive pulmonary disease) ([place])   . Hyperchloremia   . Hypercholesteremia   . Hyperpotassemia   . Hypertension   . Hypertensive renal disease, benign   . Seizures ([place])    has not had a seizure in 15 yrs  . Tobacco use    Past Surgical History:  Procedure Laterality Date  . 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, 1996  . EYE SURGERY     December 2017   Social History:   reports that he has quit smoking. He quit after 56.00 years of use. He has never used smokeless tobacco. He reports that he drinks alcohol. He reports that he does not use drugs.  Family History  Problem Relation Age of Onset  . Cancer Mother   . Congestive Heart Failure Father   . Alzheimer's disease Sister   . Congestive Heart Failure Sister   . Clotting disorder Sister   . Congestive Heart Failure Brother   . Clotting disorder Sister     Medications:  Patient's Medications  New Prescriptions   No medications on file  Previous Medications   ATORVASTATIN (LIPITOR) 10 MG TABLET    TAKE 1 TABLET (10 MG TOTAL) BY MOUTH DAILY.   CARVEDILOL (COREG) 6.25 MG TABLET    Take 6.25 mg by mouth 2 (two) times daily with a meal.   [employer] ARTIFICIAL TEARS 1-0.3 % SOLN    PLACE 1 DROP IN EACHEYE EVERY 6 HOURS AS NEEDED FOR DRY EYES   [employer] B-1 100 MG TABLET    Take 100 mg by mouth daily.    DIVALPROEX (DEPAKOTE) 250 MG DR TABLET    TAKE 1 TABLET BY MOUTH 3 TIMES A DAY FOR SEIZURES   FUROSEMIDE (LASIX) 40 MG TABLET    TAKE 1 TABLET BY MOUTH ONCE DAILY FOR EDEMA   HYDROCHLOROTHIAZIDE (HYDRODIURIL) 25 MG TABLET    Take 25 mg by mouth daily.   LEVETIRACETAM (KEPPRA) 500 MG TABLET    Take one tablet by mouth twice daily   LORAZEPAM (ATIVAN) 0.5 MG TABLET    Take 0.5 mg by mouth every 12 (twelve) hours.   MULTIPLE VITAMINS-MINERALS (THEREMS-M) TABS    Take 1 tablet by mouth daily.  Modified Medications   No medications on file  Discontinued Medications   BESIVANCE 0.6 % SUSP    Place 1 drop into the left eye See admin instructions. Uses every 4-12 hours   DUREZOL 0.05 % EMUL    Place 1 drop into the left eye daily.   PROLENSA 0.07 % SOLN    Place 1 drop into the left eye daily.   VANCOMYCIN 1,000 MG IN SODIUM CHLORIDE 0.9 % 250 ML    Inject 1,000 mg into the vein daily. Started on 04-22-2016     Physical Exam:  Vitals:   06/19/16 0946  BP: (!) 168/72  Pulse: (!) 58  Resp: 18  Temp: 97.3 F (36.3 C)  TempSrc: Oral  SpO2: 98%  Weight: 126 lb 6.4 oz (57.3 kg)  Height: _0  (1.676 m)   Body mass index is 20.4 kg/m.  Physical Exam  Constitutional: He is oriented to person, place, and time. He appears well-developed. No distress.  thin appearing  HENT:  Mouth/Throat: Oropharynx is clear and moist. No oropharyngeal exudate.  Neck: Normal range of motion.  Cardiovascular: Normal rate, regular rhythm, normal heart sounds and intact distal pulses.   Pulmonary/Chest: Effort normal and breath sounds normal. No respiratory distress.  Abdominal: Soft. Bowel sounds are normal.  Musculoskeletal: Normal range of motion. He exhibits edema (2+ to left leg, 1+ to right).  Neurological: He is alert and oriented to person, place, and time.  Skin: Skin is warm.  4 x 5 cm pressure ulcer to left heel with green/gray drainage to middle of ulcer with red base.  No erythema or warmth noted  to surrounding tissue.   Psychiatric: His affect is labile. Cognition and memory are impaired. He exhibits abnormal recent memory.   Labs reviewed: Basic Metabolic Panel:  Recent Labs  04/14/16 1612  04/18/16 0614 04/19/16 0548 04/25/16 2009  NA 140  < > 138 135 138  K 3.8  < > 4.7 4.5 4.4  CL 103  < > 101 100* 108  CO2 26  < > _1 GLUCOSE 156*  < > 105* 112* 109*  BUN 15  < > 25* 25* 23*  CREATININE 1.41*  < > 2.46* 1.87* 1.36*  CALCIUM 9.3  < > 8.5* 8.7* 7.9*  MG  1.9  --   --   --   --   TSH 1.992  --   --   --   --   < > = values in this interval not displayed. Liver Function Tests:  Recent Labs  10/15/15 1016 01/24/16 1115 04/25/16 2009  AST 19 18 33  ALT 9 8* 30  ALKPHOS 66 79 81  BILITOT 0.4 0.3 0.8  PROT 6.6 6.8 5.5*  ALBUMIN 4.0 4.0 2.1*   No results for input(s): LIPASE, AMYLASE in the last 8760 hours. No results for input(s): AMMONIA in the last 8760 hours. CBC:  Recent Labs  10/15/15 1208 01/24/16 1115  04/18/16 0614 04/18/16 2039 04/19/16 0548 04/25/16 2009  WBC 5.1 4.5  < > 7.3  --  9.8 14.5*  NEUTROABS 2,193 1,800  --   --   --   --  9.3*  HGB 12.5* 12.7*  < > 7.8* 11.1* 11.3* 8.7*  HCT 37.3* 37.4*  < > 22.7* 31.8* 31.9* 26.2*  MCV 93.3 93.7  < > 90.4  --  85.5 90.3  PLT 143 172  < > 116*  --  113* 180  < > = values in this interval not displayed. Lipid Panel:  Recent Labs  10/15/15 1016 04/15/16 0211  CHOL 217* 164  HDL 107 103  LDLCALC 97 51  TRIG 64 48  CHOLHDL 2.0 1.6   TSH:  Recent Labs  04/14/16 1612  TSH 1.992   A1C: Lab Results  Component Value Date   HGBA1C 5.2 04/15/2016     Assessment/Plan 1. Hypertensive renal disease, stage 1-4 or unspecified chronic kidney disease -blood pressure elevated today, cousin reports bacon and sausage for breakfast this morning. Also reports home health taking blood pressure at home and ranging in the 120s. Will have them keep record and bring to follow up appt. Low sodium  diet encouraged.  - CMP with eGFR  2. Seizures ([place]) No recent seizures, cont on depakote   3. Loss of weight -appetite has improved since he has stopped drinking, encouraged foods high in protein due to pressure ulcer to promote healing. - CMP with eGFR  4. Anemia in chronic kidney disease, unspecified CKD stage Will follow up CBC  5. Atrial fibrillation with RVR ([place]) Rate controlled at this time. Converted to sinus rhythm after previous hospitalization. Due to heavy alcohol use, frequent falls and hx of seizures he was not placed on anticoagulation. Recent follow up with cardiology.   6. Alcohol abuse -currently not drinking ETOH, encouraged to cont cessation. - CMP with eGFR  7. Decubitus ulcer of left heel, unstageable ([place]) -to cont current treatment per home health (calcium alginate with silver and hydogel) - CBC with Differential/Platelets - AMB referral to wound care center for follow up ASAP, will cont offloading (has boot at home) and to increase protein.   Follow up in 4 weeks.  Carlos American. Harle Battiest  [facility] [phone] 8 am - 5 pm) [phone] (after hours)

## 2016-06-19 NOTE — Patient Instructions (Addendum)
To have home health start using calcium alginate with silver and hydrogel Will get wound care consult  Will get labs today  To follow up in 4 weeks   Try to limit sodium intake but do eat foods high in protein Elevate heel so there is NO pressure on it.

## 2016-06-26 ENCOUNTER — Other Ambulatory Visit: Payer: Self-pay | Admitting: Surgery

## 2016-06-26 ENCOUNTER — Encounter: Payer: Medicare Other | Attending: Surgery | Admitting: Surgery

## 2016-06-26 ENCOUNTER — Ambulatory Visit
Admission: RE | Admit: 2016-06-26 | Discharge: 2016-06-26 | Disposition: A | Discharge status: Home / Self Care | Payer: Medicare Other | Source: Ambulatory Visit | Attending: Surgery | Admitting: Surgery

## 2016-06-26 DIAGNOSIS — F17218 Nicotine dependence, cigarettes, with other nicotine-induced disorders: Secondary | ICD-10-CM | POA: Diagnosis not present

## 2016-06-26 DIAGNOSIS — G40909 Epilepsy, unspecified, not intractable, without status epilepticus: Secondary | ICD-10-CM | POA: Insufficient documentation

## 2016-06-26 DIAGNOSIS — M869 Osteomyelitis, unspecified: Secondary | ICD-10-CM

## 2016-06-26 DIAGNOSIS — I739 Peripheral vascular disease, unspecified: Secondary | ICD-10-CM | POA: Insufficient documentation

## 2016-06-26 DIAGNOSIS — I4891 Unspecified atrial fibrillation: Secondary | ICD-10-CM | POA: Insufficient documentation

## 2016-06-26 DIAGNOSIS — Z880 Allergy status to penicillin: Secondary | ICD-10-CM | POA: Insufficient documentation

## 2016-06-26 DIAGNOSIS — L89624 Pressure ulcer of left heel, stage 4: Secondary | ICD-10-CM | POA: Diagnosis not present

## 2016-06-26 DIAGNOSIS — J449 Chronic obstructive pulmonary disease, unspecified: Secondary | ICD-10-CM | POA: Insufficient documentation

## 2016-06-26 DIAGNOSIS — X58XXXA Exposure to other specified factors, initial encounter: Secondary | ICD-10-CM | POA: Diagnosis not present

## 2016-06-26 DIAGNOSIS — B999 Unspecified infectious disease: Secondary | ICD-10-CM

## 2016-06-26 DIAGNOSIS — I129 Hypertensive chronic kidney disease with stage 1 through stage 4 chronic kidney disease, or unspecified chronic kidney disease: Secondary | ICD-10-CM | POA: Insufficient documentation

## 2016-06-26 DIAGNOSIS — S91302A Unspecified open wound, left foot, initial encounter: Secondary | ICD-10-CM | POA: Diagnosis not present

## 2016-06-26 DIAGNOSIS — E878 Other disorders of electrolyte and fluid balance, not elsewhere classified: Secondary | ICD-10-CM | POA: Diagnosis not present

## 2016-06-26 DIAGNOSIS — N183 Chronic kidney disease, stage 3 (moderate): Secondary | ICD-10-CM | POA: Diagnosis not present

## 2016-06-26 DIAGNOSIS — D631 Anemia in chronic kidney disease: Secondary | ICD-10-CM | POA: Insufficient documentation

## 2016-06-26 DIAGNOSIS — Z79899 Other long term (current) drug therapy: Secondary | ICD-10-CM | POA: Diagnosis not present

## 2016-06-28 NOTE — Progress Notes (Signed)
CAELAN, BRANDEN ([id number]) Visit Report for 06/26/2016 Allergy List Details Patient Name: [name], [name] Date of Service: 06/26/2016 9:45 AM Medical Record Number: [id number] Patient Account Number: [id number] Date of Birth/Sex: 12-02-1940 (76 y.o. Male) Treating RN: Ahmed Prima Primary Care Jakyra Kenealy: [name] Other Clinician: Referring Brodey Bonn: [name] Treating Ranesha Val/Extender: Frann Rider in Treatment: 0 Allergies Active Allergies diltiazem penicillin Allergy Notes Electronic Signature(s) Signed: 06/27/2016 4:34:11 PM By: Alric Quan Entered By: Alric Quan on 06/26/2016 09:35:15 [name], [name] ([id number]) -------------------------------------------------------------------------------- Arrival Information Details Patient Name: Jeremy Gang D. Date of Service: 06/26/2016 9:45 AM Medical Record Number: [id number] Patient Account Number: [id number] Date of Birth/Sex: 01-Jul-1940 (76 y.o. Male) Treating RN: Ahmed Prima Primary Care Jamielyn Petrucci: [name] Other Clinician: Referring Cosby Proby: [name] Treating Djuna Frechette/Extender: Frann Rider in Treatment: 0 Visit Information Patient Arrived: Ambulatory Arrival Time: 09:42 Accompanied By: cousin Transfer Assistance: None Patient Identification Verified: Yes Secondary Verification Process Yes Completed: Patient Requires Transmission-Based No Precautions: Patient Has Alerts: No Electronic Signature(s) Signed: 06/27/2016 4:34:11 PM By: Alric Quan Entered By: Alric Quan on 06/26/2016 09:43:28 [name], [name] ([id number]) -------------------------------------------------------------------------------- Clinic Level of Care Assessment Details Patient Name: Jeremy Gang D. Date of Service: 06/26/2016 9:45 AM Medical Record Number: [id number] Patient Account Number: [id number] Date of Birth/Sex: December 24, 1940 (76 y.o. Male) Treating RN: Ahmed Prima Primary Care  Carmelle Bamberg: [name] Other Clinician: Referring Remberto Lienhard: [name] Treating Jayleigh Notarianni/Extender: Frann Rider in Treatment: 0 Clinic Level of Care Assessment Items TOOL 1 Quantity Score X - Use when EandM and Procedure is performed on INITIAL visit 1 0 ASSESSMENTS - Nursing Assessment / Reassessment X - General Physical Exam (combine w/ comprehensive assessment (listed just 1 20 below) when performed on new pt. evals) X - Comprehensive Assessment (HX, ROS, Risk Assessments, Wounds Hx, etc.) 1 25 ASSESSMENTS - Wound and Skin Assessment / Reassessment []  - Dermatologic / Skin Assessment (not related to wound area) 0 ASSESSMENTS - Ostomy and/or Continence Assessment and Care []  - Incontinence Assessment and Management 0 []  - Ostomy Care Assessment and Management (repouching, etc.) 0 PROCESS - Coordination of Care []  - Simple Patient / Family Education for ongoing care 0 X - Complex (extensive) Patient / Family Education for ongoing care 1 20 X - Staff obtains Programmer, systems, Records, Test Results / Process Orders 1 10 X - Staff telephones HHA, Nursing Homes / Clarify orders / etc 1 10 []  - Routine Transfer to another Facility (non-emergent condition) 0 []  - Routine Hospital Admission (non-emergent condition) 0 X - New Admissions / Biomedical engineer / Ordering NPWT, Apligraf, etc. 1 15 []  - Emergency Hospital Admission (emergent condition) 0 PROCESS - Special Needs []  - Pediatric / Minor Patient Management 0 []  - Isolation Patient Management 0 Icenogle, Novah D. ([id number]) []  - Hearing / Language / Visual special needs 0 []  - Assessment of Community assistance (transportation, D/C planning, etc.) 0 []  - Additional assistance / Altered mentation 0 []  - Support Surface(s) Assessment (bed, cushion, seat, etc.) 0 INTERVENTIONS - Miscellaneous []  - External ear exam 0 []  - Patient Transfer (multiple staff / Civil Service fast streamer / Similar devices) 0 []  - Simple Staple / Suture  removal (25 or less) 0 []  - Complex Staple / Suture removal (26 or more) 0 []  - Hypo/Hyperglycemic Management (do not check if billed separately) 0 X - Ankle / Brachial Index (ABI) - do not check if billed separately 1 15 Has the patient been seen at the hospital within the last three years: Yes  Total Score: 115 Level Of Care: New/Established - Level 3 Electronic Signature(s) Signed: 06/27/2016 4:34:11 PM By: Alric Quan Entered By: Alric Quan on 06/26/2016 11:35:58 [name], [name] ([id number]) -------------------------------------------------------------------------------- Encounter Discharge Information Details Patient Name: Jeremy Gang D. Date of Service: 06/26/2016 9:45 AM Medical Record Number: [id number] Patient Account Number: [id number] Date of Birth/Sex: [birth year] (76 y.o. Male) Treating RN: Ahmed Prima Primary Care Laquisha Northcraft: [name] Other Clinician: Referring Makeda Peeks: [name] Treating Roni Friberg/Extender: Frann Rider in Treatment: 0 Encounter Discharge Information Items Discharge Pain Level: 0 Discharge Condition: Stable Ambulatory Status: Ambulatory Discharge Destination: Home Transportation: Private Auto Accompanied By: cousin Schedule Follow-up Appointment: Yes Medication Reconciliation completed and provided to Patient/Care No Libero Puthoff: Provided on Clinical Summary of Care: 06/26/2016 Form Type Recipient Paper Patient [facility] Electronic Signature(s) Signed: 06/26/2016 10:57:23 AM By: Ruthine Dose Entered By: Ruthine Dose on 06/26/2016 10:57:22 [name], [name] ([id number]) -------------------------------------------------------------------------------- Lower Extremity Assessment Details Patient Name: Jeremy Gang D. Date of Service: 06/26/2016 9:45 AM Medical Record Number: [id number] Patient Account Number: [id number] Date of Birth/Sex: 1940-07-23 (76 y.o. Male) Treating RN: Ahmed Prima Primary Care Nate Perri: [name] Other Clinician: Referring Tosha Belgarde: [name] Treating Cassidy Tashiro/Extender: Frann Rider in Treatment: 0 Edema Assessment Assessed: Shirlyn Goltz: No] [Right: No] E[Left: dema] [Right: :] Calf Left: Right: Point of Measurement: 32 cm From Medial Instep 30.9 cm cm Ankle Left: Right: Point of Measurement: 9 cm From Medial Instep 23.5 cm cm Vascular Assessment Pulses: Dorsalis Pedis Palpable: [Left:No] Doppler Audible: [Left:Yes] Posterior Tibial Extremity colors, hair growth, and conditions: Extremity Color: [Left:Hyperpigmented] Temperature of Extremity: [Left:Cool] Capillary Refill: [Left:< 3 seconds] Blood Pressure: Brachial: [Left:160] Dorsalis Pedis: 68 [Left:Dorsalis Pedis:] Ankle: Posterior Tibial: 70 [Left:Posterior Tibial: 0.44] Toe Nail Assessment Left: Right: Thick: No Discolored: No Deformed: No Improper Length and Hygiene: No Electronic Signature(s) Signed: 06/27/2016 4:34:11 PM By: Karren Burly ([id number]) Entered By: Alric Quan on 06/26/2016 10:06:38 Vivian, Alease Medina D. ([id number]) -------------------------------------------------------------------------------- Multi Wound Chart Details Patient Name: Jeremy Gang D. Date of Service: 06/26/2016 9:45 AM Medical Record Number: [id number] Patient Account Number: [id number] Date of Birth/Sex: 1940-10-20 (76 y.o. Male) Treating RN: Ahmed Prima Primary Care Connee Ikner: [name] Other Clinician: Referring Rockell Faulks: [name] Treating Tarquin Welcher/Extender: Frann Rider in Treatment: 0 Vital Signs Height(in): Pulse(bpm): 66 Weight(lbs): 128.8 Blood Pressure 166/57 (mmHg): Body Mass Index(BMI): Temperature(F): 97.4 Respiratory Rate 18 (breaths/min): Photos: [1:No Photos] [N/A:N/A] Wound Location: [1:Left Calcaneus] [N/A:N/A] Wounding Event: [1:Pressure Injury] [N/A:N/A] Primary Etiology: [1:Pressure Ulcer] [N/A:N/A] Comorbid History: [1:Chronic  Obstructive Pulmonary Disease (COPD), Arrhythmia, Hypertension, Seizure Disorder] [N/A:N/A] Date Acquired: [1:05/08/2016] [N/A:N/A] Weeks of Treatment: [1:0] [N/A:N/A] Wound Status: [1:Open] [N/A:N/A] Measurements L x W x D 2.8x3.5x0.3 [N/A:N/A] (cm) Area (cm) : [1:7.697] [N/A:N/A] Volume (cm) : [1:2.309] [N/A:N/A] 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] [N/A:N/A] Exudate Amount: [1:Large] [N/A:N/A] Exudate Type: [1:Purulent] [N/A:N/A] Exudate Color: [1:yellow, brown, green] [N/A:N/A] Foul Odor After [1:Yes] [N/A:N/A] Cleansing: Odor Anticipated Due to No [N/A:N/A] Product Use: Wound Margin: [1:Distinct, outline attached] [N/A:N/A] Granulation Amount: [1:Small (1-33%)] [N/A:N/A] Granulation Quality: [1:Red] [N/A:N/A] Necrotic Amount: [1:Large (67-100%)] [N/A:N/A] Necrotic Tissue: [1:Eschar, Adherent Slough] [N/A:N/A] Epithelialization: [1:None] [N/A:N/A] Debridement: Debridement (23536- N/A N/A 11047) Pre-procedure 10:29 N/A N/A Verification/Time Out Taken: Pain Control: Lidocaine 4% Topical N/A N/A Solution Tissue Debrided: Fibrin/Slough, Exudates, N/A N/A Subcutaneous Level: Skin/Subcutaneous N/A N/A Tissue Debridement Area (sq 9.8 N/A N/A cm): Instrument: Blade, Forceps N/A N/A Bleeding: Minimum N/A N/A Hemostasis Achieved: Pressure N/A N/A Procedural Pain: 0 N/A N/A Post Procedural Pain: 0 N/A N/A Debridement Treatment Procedure  was tolerated N/A N/A Response: well Post Debridement 2.8x3.5x1.5 N/A N/A Measurements L x W x D (cm) Post Debridement 11.545 N/A N/A Volume: (cm) Post Debridement Category/Stage III N/A N/A Stage: Periwound Skin Texture: No Abnormalities Noted N/A N/A Periwound Skin No Abnormalities Noted N/A N/A Moisture: Periwound Skin Color: No Abnormalities Noted N/A N/A Temperature: No Abnormality N/A N/A Tenderness on Yes N/A N/A Palpation: Wound Preparation: Ulcer Cleansing: N/A N/A Rinsed/Irrigated with Saline Topical  Anesthetic Applied: Other: lidocaine 4% Procedures Performed: Debridement N/A N/A Treatment Notes Wound #1 (Left Calcaneus) 1. Cleansed with: Clean wound with Normal Saline 2. Anesthetic Topical Lidocaine 4% cream to wound bed prior to debridement Corle, Antavius D. ([id number]) 4. Dressing Applied: Santyl Ointment 5. Secondary Dressing Applied Dry Gauze Kerlix/Conform 7. Secured with Tape Notes heel cup Electronic Signature(s) Signed: 06/26/2016 11:11:44 AM By: Christin Fudge MD, FACS Entered By: Christin Fudge on 06/26/2016 11:11:44 [name], [name] ([id number]) -------------------------------------------------------------------------------- [place] Details Patient Name: [name], [name] Date of Service: 06/26/2016 9:45 AM Medical Record Number: [id number] Patient Account Number: [id number] Date of Birth/Sex: [birth year] (76 y.o. Male) Treating RN: Ahmed Prima Primary Care Azazel Franze: [name] Other Clinician: Referring Capers Hagmann: [name] Treating Jennica Tagliaferri/Extender: Frann Rider in Treatment: 0 Active Inactive ` Abuse / Safety / Falls / Self Care Management Nursing Diagnoses: Potential for falls Goals: Patient will remain injury free Date Initiated: 06/26/2016 Target Resolution Date: 10/11/2016 Goal Status: Active Interventions: Assess fall risk on admission and as needed Assess self care needs on admission and as needed Notes: ` Nutrition Nursing Diagnoses: Imbalanced nutrition Goals: Patient/caregiver agrees to and verbalizes understanding of need to use nutritional supplements and/or vitamins as prescribed Date Initiated: 06/26/2016 Target Resolution Date: 10/11/2016 Goal Status: Active Interventions: Assess patient nutrition upon admission and as needed per policy Notes: ` Orientation to the Wound Care Program Nursing Diagnoses: CRUE, OTERO ([id number]) Knowledge deficit related to the wound healing center  program Goals: Patient/caregiver will verbalize understanding of the [place] Date Initiated: 06/26/2016 Target Resolution Date: 07/12/2016 Goal Status: Active Interventions: Provide education on orientation to the wound center Notes: ` Pain, Acute or Chronic Nursing Diagnoses: Pain, acute or chronic: actual or potential Potential alteration in comfort, pain Goals: Patient/caregiver will verbalize adequate pain control between visits Date Initiated: 06/26/2016 Target Resolution Date: 10/11/2016 Goal Status: Active Interventions: Assess comfort goal upon admission Complete pain assessment as per visit requirements Notes: ` Wound/Skin Impairment Nursing Diagnoses: Impaired tissue integrity Knowledge deficit related to smoking impact on wound healing Goals: Ulcer/skin breakdown will have a volume reduction of 80% by week 12 Date Initiated: 06/26/2016 Target Resolution Date: 09/06/2016 Goal Status: Active Interventions: Assess patient/caregiver ability to perform ulcer/skin care regimen upon admission and as needed Assess ulceration(s) every visit Provide education on smoking [name], [name] ([id number]) Notes: Electronic Signature(s) Signed: 06/27/2016 4:34:11 PM By: Alric Quan Entered By: Alric Quan on 06/26/2016 10:23:18 [name], [name] ([id number]) -------------------------------------------------------------------------------- Pain Assessment Details Patient Name: Jeremy Gang D. Date of Service: 06/26/2016 9:45 AM Medical Record Number: [id number] Patient Account Number: [id number] Date of Birth/Sex: 1940/05/10 (76 y.o. Male) Treating RN: Ahmed Prima Primary Care Derrico Zhong: [name] Other Clinician: Referring Gaberial Cada: [name] Treating Maurissa Ambrose/Extender: Frann Rider in Treatment: 0 Active Problems Location of Pain Severity and Description of Pain Patient Has Paino Yes Site Locations Pain Location: Pain in  Ulcers With Dressing Change: Yes Rate the pain. Current Pain Level: 8 Character of Pain Describe the Pain: Aching Pain Management and Medication  Current Pain Management: Electronic Signature(s) Signed: 06/27/2016 4:34:11 PM By: Alric Quan Entered By: Alric Quan on 06/26/2016 09:44:12 [place]lake, Nomar D. ([id number]) -------------------------------------------------------------------------------- Patient/Caregiver Education Details Patient Name: Jeremy Gang D. Date of Service: 06/26/2016 9:45 AM Medical Record Number: [id number] Patient Account Number: [id number] Date of Birth/Gender: [birth year] (76 y.o. Male) Treating RN: Ahmed Prima Primary Care Physician: [name] Other Clinician: Referring Physician: [name] Treating Physician/Extender: Frann Rider in Treatment: 0 Education Assessment Education Provided To: Patient Education Topics Provided Smoking and Wound Healing: Handouts: Smoking and Wound Healing Methods: Explain/Verbal Responses: State content correctly Welcome To [facility] [place]: Handouts: Welcome To [facility] [place] Methods: Explain/Verbal Responses: State content correctly Wound/Skin Impairment: Handouts: Other: change dressing as ordered Methods: Demonstration, Explain/Verbal Responses: State content correctly Electronic Signature(s) Signed: 06/27/2016 4:34:11 PM By: Alric Quan Entered By: Alric Quan on 06/26/2016 10:25:07 [name], [name] ([id number]) -------------------------------------------------------------------------------- Wound Assessment Details Patient Name: Jeremy Gang D. Date of Service: 06/26/2016 9:45 AM Medical Record Number: [id number] Patient Account Number: [id number] Date of Birth/Sex: Jan 23, 1941 (76 y.o. Male) Treating RN: Ahmed Prima Primary Care Lenay Lovejoy: [name] Other Clinician: Referring Seraphine Gudiel: [name] Treating Zechariah Bissonnette/Extender: Frann Rider  in Treatment: 0 Wound Status Wound Number: 1 Primary Pressure Ulcer Etiology: Wound Location: Left Calcaneus Wound Open Wounding Event: Pressure Injury Status: Date Acquired: 05/08/2016 Comorbid Chronic Obstructive Pulmonary Weeks Of Treatment: 0 History: Disease (COPD), Arrhythmia, Clustered Wound: No Hypertension, Seizure Disorder Photos Photo Uploaded By: Alric Quan on 06/26/2016 16:14:51 Wound Measurements Length: (cm) 2.8 Width: (cm) 3.5 Depth: (cm) 0.3 Area: (cm) 7.697 Volume: (cm) 2.309 % Reduction in Area: % Reduction in Volume: Epithelialization: None Tunneling: No Undermining: No Wound Description Classification: Category/Stage III Foul Odor Aft Wound Margin: Distinct, outline attached Due to Produc Exudate Amount: Large Slough/Fibrin Exudate Type: Purulent Exudate Color: yellow, brown, green er Cleansing: Yes t Use: No o Yes Wound Bed Granulation Amount: Small (1-33%) Granulation Quality: Red Necrotic Amount: Large (67-100%) Necrotic Quality: Eschar, Adherent [address] [name] ([id number]) Periwound Skin Texture Texture Color No Abnormalities Noted: No No Abnormalities Noted: No Moisture Temperature / Pain No Abnormalities Noted: No Temperature: No Abnormality Tenderness on Palpation: Yes Wound Preparation Ulcer Cleansing: Rinsed/Irrigated with Saline Topical Anesthetic Applied: Other: lidocaine 4%, Treatment Notes Wound #1 (Left Calcaneus) 1. Cleansed with: Clean wound with Normal Saline 2. Anesthetic Topical Lidocaine 4% cream to wound bed prior to debridement 4. Dressing Applied: Santyl Ointment Plain packing gauze 5. Secondary Dressing Applied Dry Gauze Foam Kerlix/Conform 7. Secured with Tape Notes heel cup Electronic Signature(s) Signed: 06/27/2016 4:34:11 PM By: Alric Quan Entered By: Alric Quan on 06/26/2016 10:12:22 [name], [name]  ([id number]) -------------------------------------------------------------------------------- Vitals Details Patient Name: Jeremy Gang D. Date of Service: 06/26/2016 9:45 AM Medical Record Number: [id number] Patient Account Number: [id number] Date of Birth/Sex: [birth year] (76 y.o. Male) Treating RN: Ahmed Prima Primary Care Norm Wray: [name] Other Clinician: Referring Robertta Halfhill: [name] Treating Zain Bingman/Extender: Frann Rider in Treatment: 0 Vital Signs Time Taken: 09:44 Temperature (F): 97.4 Weight (lbs): 128.8 Pulse (bpm): 66 Source: Measured Respiratory Rate (breaths/min): 18 Blood Pressure (mmHg): 166/57 Reference Range: 80 - 120 mg / dl Electronic Signature(s) Signed: 06/27/2016 4:34:11 PM By: Alric Quan Entered By: Alric Quan on 06/26/2016 09:46:16

## 2016-06-28 NOTE — Progress Notes (Signed)
Jeremy, [name] ([id number]) Visit Report for 06/26/2016 Abuse/Suicide Risk Screen Details Patient Name: [name], [name] Date of Service: 06/26/2016 9:45 AM Medical Record Number: [id number] Patient Account Number: [id number] Date of Birth/Sex: 06-15-1940 (76 y.o. Male) Treating RN: Ahmed Prima Primary Care Shanley Furlough: [name] Other Clinician: Referring Malyk Girouard: [name] Treating Machai Desmith/Extender: Frann Rider in Treatment: 0 Abuse/Suicide Risk Screen Items Answer ABUSE/SUICIDE RISK SCREEN: Has anyone close to you tried to hurt or harm you recentlyo No Do you feel uncomfortable with anyone in your familyo No Has anyone forced you do things that you didnot want to doo No Do you have any thoughts of harming yourselfo No Patient displays signs or symptoms of abuse and/or neglect. No Electronic Signature(s) Signed: 06/27/2016 4:34:11 PM By: Alric Quan Entered By: Alric Quan on 06/26/2016 09:51:18 Weisse, Giancarlos D. ([id number]) -------------------------------------------------------------------------------- Activities of Daily Living Details Patient Name: Jeremy, [name] Date of Service: 06/26/2016 9:45 AM Medical Record Number: [id number] Patient Account Number: [id number] Date of Birth/Sex: 03-Oct-1940 (76 y.o. Male) Treating RN: Ahmed Prima Primary Care Nain Rudd: [name] Other Clinician: Referring Nusaybah Ivie: [name] Treating Kaycee Haycraft/Extender: Frann Rider in Treatment: 0 Activities of Daily Living Items Answer Activities of Daily Living (Please select one for each item) Drive Automobile Not Able Take Medications Not Able Use Telephone Need Assistance Care for Appearance Completely Able Use Toilet Completely Able Bath / Shower Completely Able Dress Self Completely Able Feed Self Completely Able Walk Completely Able Get In / Out Bed Completely [place]  Need Assistance Shop for Self Not Able Electronic Signature(s) Signed: 06/27/2016 4:34:11 PM By: Alric Quan Entered By: Alric Quan on 06/26/2016 09:53:03 Faro, Jayce D. ([id number]) -------------------------------------------------------------------------------- Education Assessment Details Patient Name: Jeremy Gang D. Date of Service: 06/26/2016 9:45 AM Medical Record Number: [id number] Patient Account Number: [id number] Date of Birth/Sex: [birth year] (76 y.o. Male) Treating RN: Ahmed Prima Primary Care [place] Fassnacht: [name] Other Clinician: Referring Karrissa Parchment: [name] Treating Catelyn Friel/Extender: Frann Rider in Treatment: 0 Primary Learner Assessed: Patient Learning Preferences/Education Level/Primary Language Learning Preference: Explanation, Printed Material Highest Education Level: High School Preferred Language: English Cognitive Barrier Assessment/Beliefs Language Barrier: No Translator Needed: No Memory Deficit: No Emotional Barrier: No Cultural/Religious Beliefs Affecting Medical No Care: Physical Barrier Assessment Impaired Vision: Yes Glasses Impaired Hearing: No Decreased Hand dexterity: No Knowledge/Comprehension Assessment Knowledge Level: Medium Comprehension Level: Medium Ability to understand written Medium instructions: Ability to understand verbal Medium instructions: Motivation Assessment Anxiety Level: Calm Cooperation: Cooperative Education Importance: Acknowledges Need Interest in Health Problems: Asks Questions Perception: Coherent Willingness to Engage in Self- Medium Management Activities: Readiness to Engage in Self- Medium Management Activities: Electronic Signature(s) JAMEISON, HAJI ([id number]) Signed: 06/27/2016 4:34:11 PM By: Alric Quan Entered By: Alric Quan on 06/26/2016 09:53:27 Salone, Chip D.  ([id number]) -------------------------------------------------------------------------------- Fall Risk Assessment Details Patient Name: Jeremy Gang D. Date of Service: 06/26/2016 9:45 AM Medical Record Number: [id number] Patient Account Number: [id number] Date of Birth/Sex: 12-21-1940 (76 y.o. Male) Treating RN: Ahmed Prima Primary Care Antoneo Ghrist: [name] Other Clinician: Referring Josalin Carneiro: [name] Treating Domenique Quest/Extender: Frann Rider in Treatment: 0 Fall Risk Assessment Items Have you had 2 or more falls in the last 12 monthso 0 Yes Have you had any fall that resulted in injury in the last 12 monthso 0 No FALL RISK ASSESSMENT: History of falling - immediate or within 3 months 0 No Secondary diagnosis 15 Yes Ambulatory aid None/bed rest/wheelchair/nurse 0 No Crutches/cane/walker 0 No  Furniture 0 No IV Access/Saline Lock 0 No Gait/Training Normal/bed rest/immobile 0 No Weak 0 No Impaired 20 Yes Mental Status Oriented to own ability 0 No Electronic Signature(s) Signed: 06/27/2016 4:34:11 PM By: Alric Quan Entered By: Alric Quan on 06/26/2016 09:54:04 Evangelist, Franke D. ([id number]) -------------------------------------------------------------------------------- Foot Assessment Details Patient Name: Jeremy Gang D. Date of Service: 06/26/2016 9:45 AM Medical Record Number: [id number] Patient Account Number: [id number] Date of Birth/Sex: 1941/02/19 (76 y.o. Male) Treating RN: Ahmed Prima Primary Care Cleola Perryman: [name] Other Clinician: Referring Sailor Hevia: [name] Treating Karem Farha/Extender: Frann Rider in Treatment: 0 Foot Assessment Items Site Locations + = Sensation present, - = Sensation absent, C = Callus, U = Ulcer R = Redness, W = Warmth, M = Maceration, PU = Pre-ulcerative lesion F = Fissure, S = Swelling, D = Dryness Assessment Right: Left: Other Deformity: No No Prior Foot Ulcer: No No Prior  Amputation: No No Charcot Joint: No No Ambulatory Status: Ambulatory Without Help Gait: Steady Electronic Signature(s) Signed: 06/27/2016 4:34:11 PM By: Alric Quan Entered By: Alric Quan on 06/26/2016 09:57:57 Amenta, Leanard D. ([id number]) -------------------------------------------------------------------------------- Nutrition Risk Assessment Details Patient Name: Jeremy Gang D. Date of Service: 06/26/2016 9:45 AM Medical Record Number: [id number] Patient Account Number: [id number] Date of Birth/Sex: [birth year] (76 y.o. Male) Treating RN: Ahmed Prima Primary Care Asmaa Tirpak: [name] Other Clinician: Referring Sheldon Sem: [name] Treating Rudie Sermons/Extender: Frann Rider in Treatment: 0 Height (in): Weight (lbs): 128.8 Body Mass Index (BMI): Nutrition Risk Assessment Items NUTRITION RISK SCREEN: I have an illness or condition that made me change the kind and/or 0 No amount of food I eat I eat fewer than two meals per day 0 No I eat few fruits and vegetables, or milk products 0 No I have three or more drinks of beer, liquor or wine almost every day 0 No I have tooth or mouth problems that make it hard for me to eat 0 No I don't always have enough money to buy the food I need 0 No I eat alone most of the time 0 No I take three or more different prescribed or over-the-counter drugs a 1 Yes day Without wanting to, I have lost or gained 10 pounds in the last six 0 No months I am not always physically able to shop, cook and/or feed myself 0 No Nutrition Protocols Good Risk Protocol Moderate Risk Protocol Electronic Signature(s) Signed: 06/27/2016 4:34:11 PM By: Alric Quan Entered By: Alric Quan on 06/26/2016 09:54:16

## 2016-06-28 NOTE — Progress Notes (Signed)
LESTAT, GOLOB ([id number]) Visit Report for 06/26/2016 Chief Complaint Document Details Patient Name: [name], [name] Date of Service: 06/26/2016 9:45 AM Medical Record Number: [id number] Patient Account Number: [id number] Date of Birth/Sex: 03/23/1941 (76 y.o. Male) Treating RN: Ahmed Prima Primary Care Provider: [name] Other Clinician: Referring Provider: [name] Treating Provider/Extender: Frann Rider in Treatment: 0 Information Obtained from: Patient Chief Complaint Patient is at the clinic for treatment of an open pressure ulcer the left heel which she's had for about 2 months Electronic Signature(s) Signed: 06/26/2016 11:12:39 AM By: Christin Fudge MD, FACS Entered By: Christin Fudge on 06/26/2016 11:12:39 Sekula, Zarion D. ([id number]) -------------------------------------------------------------------------------- Debridement Details Patient Name: Jeremy Gang D. Date of Service: 06/26/2016 9:45 AM Medical Record Number: [id number] Patient Account Number: [id number] Date of Birth/Sex: 06/23/1940 (76 y.o. Male) Treating RN: Ahmed Prima Primary Care Provider: [name] Other Clinician: Referring Provider: [name] Treating Provider/Extender: Frann Rider in Treatment: 0 Debridement Performed for Wound #1 Left Calcaneus Assessment: Performed By: Physician Christin Fudge, MD Debridement: Debridement Pre-procedure Yes - 10:29 Verification/Time Out Taken: Start Time: 10:30 Pain Control: Lidocaine 4% Topical Solution Level: Skin/Subcutaneous Tissue Total Area Debrided (L x 2.8 (cm) x 3.5 (cm) = 9.8 (cm) W): Tissue and other Viable, Non-Viable, Exudate, Fibrin/Slough, Subcutaneous material debrided: Instrument: Blade, Forceps Bleeding: Minimum Hemostasis Achieved: Pressure End Time: 10:33 Procedural Pain: 0 Post Procedural Pain: 0 Response to Treatment: Procedure was tolerated well Post Debridement Measurements of Total  Wound Length: (cm) 2.8 Stage: Category/Stage IV Width: (cm) 3.5 Depth: (cm) 1.5 Volume: (cm) 11.545 Character of Wound/Ulcer Post Requires Further Debridement: Debridement Severity of Tissue Post Fat layer exposed Debridement: Post Procedure Diagnosis Same as Pre-procedure Notes after sharp debridement of all the necrotic debris the calcaneus bone is palpable Electronic Signature(s) Signed: 06/26/2016 11:12:20 AM By: Christin Fudge MD, FACS WON, KREUZER D. ([id number]) Signed: 06/27/2016 4:34:11 PM By: Alric Quan Entered By: Christin Fudge on 06/26/2016 11:12:19 Mintzer, Nyheem D. ([id number]) -------------------------------------------------------------------------------- HPI Details Patient Name: Jeremy Gang D. Date of Service: 06/26/2016 9:45 AM Medical Record Number: [id number] Patient Account Number: [id number] Date of Birth/Sex: Jul 26, 1940 (76 y.o. Male) Treating RN: Ahmed Prima Primary Care Provider: [name] Other Clinician: Referring Provider: [name] Treating Provider/Extender: Frann Rider in Treatment: 0 History of Present Illness Location: left heel ulceration Quality: Patient reports experiencing a dull pain to affected area(s). Severity: Patient states wound are getting worse. Duration: Patient has had the wound for > 2 months prior to seeking treatment at the wound center Timing: Pain in wound is Intermittent (comes and goes Context: The wound appeared gradually over time Modifying Factors: Other treatment(s) tried include:local care and offloading Associated Signs and Symptoms: Patient reports having difficulty standing for long periods. HPI Description: 76 year old gentleman who is a reformed alcoholic for about 6 months now was referred to as by his PCP team for a left heel ulceration which she's had for about 2 months. He has a history of alcohol abuse, aneurysm, hypertensive renal disease, anemia of chronic kidney disease, atrial  fibrillation with RVR, COPD, hypertension, seizure disorders, tobacco abuse, cerebral aneurysm repair in 1997 and some ice surgery. He continues to smoke about half a pack of cigarettes a day. the patient is not fully competent and has a part of attorney along with him today. Electronic Signature(s) Signed: 06/26/2016 11:15:32 AM By: Christin Fudge MD, FACS Entered By: Christin Fudge on 06/26/2016 11:15:32 ARSHAWN, VALDEZ D. ([id number]) -------------------------------------------------------------------------------- Physical Exam Details Patient Name: [name], [name] Date  of Service: 06/26/2016 9:45 AM Medical Record Number: [id number] Patient Account Number: [id number] Date of Birth/Sex: November 12, 1940 (76 y.o. Male) Treating RN: Ahmed Prima Primary Care Provider: [name] Other Clinician: Referring Provider: [name] Treating Provider/Extender: Frann Rider in Treatment: 0 Constitutional . Pulse regular. Respirations normal and unlabored. Afebrile. . Eyes Nonicteric. Reactive to light. Ears, Nose, Mouth, and Throat Lips, teeth, and gums WNL.Marland Kitchen Moist mucosa without lesions. Neck supple and nontender. No palpable supraclavicular or cervical adenopathy. Normal sized without goiter. Respiratory WNL. No retractions.. Cardiovascular Pedal Pulses WNL. the left ABI 0.44. is no evidence of any lymphedema. Chest Breasts symmetical and no nipple discharge.. Breast tissue WNL, no masses, lumps, or tenderness.. Gastrointestinal (GI) Abdomen without masses or tenderness.. No liver or spleen enlargement or tenderness.. Lymphatic No adneopathy. No adenopathy. No adenopathy. Musculoskeletal Adexa without tenderness or enlargement.. Digits and nails w/o clubbing, cyanosis, infection, petechiae, ischemia, or inflammatory conditions.. Integumentary (Hair, Skin) No suspicious lesions. No crepitus or fluctuance. No peri-wound warmth or erythema. No masses.Marland Kitchen Psychiatric Judgement  and insight Intact.. No evidence of depression, anxiety, or agitation.. Notes on the posterior left heel in the region of the calcaneus he's got a lot of necrotic debris which was sharply removed with forcep and a 50 number blade and this was restricted only to the necrotic debris which goes right down to the calcaneal bone and this is easily probed. No surrounding soft tissue was debrided due to his poor circulation with a ABI of 0.44 Electronic Signature(s) Signed: 06/26/2016 11:16:49 AM By: Christin Fudge MD, FACS Entered By: Christin Fudge on 06/26/2016 11:16:47 JAMEEK, BRUNTZ ([id number]) LEDGER, HEINDL DMarland Kitchen ([id number]) -------------------------------------------------------------------------------- Physician Orders Details Patient Name: Jeremy Gang D. Date of Service: 06/26/2016 9:45 AM Medical Record Number: [id number] Patient Account Number: [id number] Date of Birth/Sex: [birth year] (76 y.o. Male) Treating RN: Ahmed Prima Primary Care Provider: [name] Other Clinician: Referring Provider: [name] Treating Provider/Extender: Frann Rider in Treatment: 0 Verbal / Phone Orders: Yes Clinician: Carolyne Fiscal, Debi Read Back and Verified: Yes Diagnosis Coding Wound Cleansing Wound #1 Left Calcaneus o Clean wound with Normal Saline. Anesthetic Wound #1 Left Calcaneus o Topical Lidocaine 4% cream applied to wound bed prior to debridement - clinic use Primary Wound Dressing Wound #1 Left Calcaneus o Santyl Ointment o Medihoney gel - if unable to get santyl o Plain packing gauze - with the santyl into cavity Secondary Dressing Wound #1 Left Calcaneus o Dry Gauze o Conform/Kerlix o Other - heel cup, tape Dressing Change Frequency Wound #1 Left Calcaneus o Change dressing every day. Follow-up Appointments Wound #1 Left Calcaneus o Return Appointment in 1 week. Off-Loading Wound #1 Left Calcaneus o Turn and reposition every 2 hours o  Other: - keep pressure off the heel Additional Orders / Instructions Wound #1 Left Calcaneus Baller, Sheridan D. ([id number]) o Stop Smoking o Increase protein intake. Home Health Wound #1 Left [place] Nurse may visit PRN to address patientos wound care needs. o FACE TO FACE ENCOUNTER: MEDICARE and MEDICAID PATIENTS: I certify that this patient is under my care and that I had a face-to-face encounter that meets the physician face-to-face encounter requirements with this patient on this date. The encounter with the patient was in whole or in part for the following MEDICAL CONDITION: (primary reason for [place]) MEDICAL NECESSITY: I certify, that based on my findings, NURSING services are a medically necessary home health service. HOME BOUND STATUS: I certify  that my clinical findings support that this patient is homebound (i.e., Due to illness or injury, pt requires aid of supportive devices such as crutches, cane, wheelchairs, walkers, the use of special transportation or the assistance of another person to leave their place of residence. There is a normal inability to leave the home and doing so requires considerable and taxing effort. Other absences are for medical reasons / religious services and are infrequent or of short duration when for other reasons). o If current dressing causes regression in wound condition, may D/C ordered dressing product/s and apply Normal Saline Moist Dressing daily until next [place] / Other MD appointment. [place] of regression in wound condition at [phone]. o Please direct any NON-WOUND related issues/requests for orders to patient's Primary Care Physician Medications-please add to medication list. Wound #1 Left Calcaneus o Santyl Enzymatic Ointment o Other: - vitamin c, vitamin a, zinc, MVI Radiology o X-ray, heel - left Services and  Therapies o Arterial Studies- Bilateral - Dr. Fletcher Anon Patient Medications Allergies: diltiazem, penicillin Notifications Medication Indication Start End Santyl 06/26/2016 DOSE topical 250 unit/gram ointment - ointment topical as directed Electronic Signature(s) Signed: 06/26/2016 11:21:21 AM By: Christin Fudge MD, FACS TERESA, LEMMERMAN D. ([id number]) Entered By: Christin Fudge on 06/26/2016 11:21:20 Howden, Alease Medina D. ([id number]) -------------------------------------------------------------------------------- Problem List Details Patient Name: Jeremy Gang D. Date of Service: 06/26/2016 9:45 AM Medical Record Number: [id number] Patient Account Number: [id number] Date of Birth/Sex: [birth year] (76 y.o. Male) Treating RN: Ahmed Prima Primary Care Provider: [name] Other Clinician: Referring Provider: [name] Treating Provider/Extender: Frann Rider in Treatment: 0 Active Problems ICD-10 Encounter Code Description Active Date Diagnosis [phone] Pressure ulcer of left heel, stage 4 06/26/2016 Yes I73.9 Peripheral vascular disease, unspecified 06/26/2016 Yes F17.218 Nicotine dependence, cigarettes, with other nicotine- 06/26/2016 Yes induced disorders Inactive Problems Resolved Problems Electronic Signature(s) Signed: 06/26/2016 11:11:38 AM By: Christin Fudge MD, FACS Entered By: Christin Fudge on 06/26/2016 11:11:37 Behl, Deshawn D. ([id number]) -------------------------------------------------------------------------------- Progress Note Details Patient Name: Jeremy Gang D. Date of Service: 06/26/2016 9:45 AM Medical Record Number: [id number] Patient Account Number: [id number] Date of Birth/Sex: [birth year] (76 y.o. Male) Treating RN: Ahmed Prima Primary Care Provider: [name] Other Clinician: Referring Provider: [name] Treating Provider/Extender: Frann Rider in Treatment: 0 Subjective Chief Complaint Information obtained from  Patient Patient is at the clinic for treatment of an open pressure ulcer the left heel which she's had for about 2 months History of Present Illness (HPI) The following HPI elements were documented for the patient's wound: Location: left heel ulceration Quality: Patient reports experiencing a dull pain to affected area(s). Severity: Patient states wound are getting worse. Duration: Patient has had the wound for > 2 months prior to seeking treatment at the wound center Timing: Pain in wound is Intermittent (comes and goes Context: The wound appeared gradually over time Modifying Factors: Other treatment(s) tried include:local care and offloading Associated Signs and Symptoms: Patient reports having difficulty standing for long periods. 76 year old gentleman who is a reformed alcoholic for about 6 months now was referred to as by his PCP team for a left heel ulceration which she's had for about 2 months. He has a history of alcohol abuse, aneurysm, hypertensive renal disease, anemia of chronic kidney disease, atrial fibrillation with RVR, COPD, hypertension, seizure disorders, tobacco abuse, cerebral aneurysm repair in 1997 and some ice surgery. He continues to smoke about half a pack of cigarettes a day. the patient is not fully competent and  has a part of attorney along with him today. Wound History Patient presents with 1 open wound that has been present for approximately month. Patient has been treating wound in the following manner: unsure. Laboratory tests have not been performed in the last month. Patient reportedly has not tested positive for an antibiotic resistant organism. Patient reportedly has not tested positive for osteomyelitis. Patient reportedly has not had testing performed to evaluate circulation in the legs. Patient experiences the following problems associated with their wounds: swelling. Patient History Allergies diltiazem, penicillin Family History Cancer - Mother,  Heart Disease - Father, Siblings, Siblings. SHAMAR, ENGELMANN ([id number]) Social History Current some day smoker - 2-3 a day, Marital Status - Single, Drug Use - Prior History - 5 months clean, Caffeine Use - Daily. Medical History Respiratory Patient has history of Chronic Obstructive Pulmonary Disease (COPD) Cardiovascular Patient has history of Arrhythmia - a-fib, Hypertension Neurologic Patient has history of Seizure Disorder Review of Systems (ROS) Constitutional Symptoms (General Health) The patient has no complaints or symptoms. Eyes Complains or has symptoms of Glasses / Contacts. Hematologic/Lymphatic aneurysm hyperchloremia hyperpotassemia Cardiovascular Hypercholesterol Genitourinary CKD III hypertensive renal disease Medications: Lipitor, Coreg, Depakote, Lasix, hydro-Diuril, Keppra, Ativan, multivitamins. Objective Constitutional Pulse regular. Respirations normal and unlabored. Afebrile. Vitals Time Taken: 9:44 AM, Weight: 128.8 lbs, Source: Measured, Temperature: 97.4 F, Pulse: 66 bpm, Respiratory Rate: 18 breaths/min, Blood Pressure: 166/57 mmHg. Eyes Nonicteric. Reactive to light. Ears, Nose, Mouth, and Throat Franqui, Damien D. ([id number]) Lips, teeth, and gums WNL.Marland Kitchen Moist mucosa without lesions. Neck supple and nontender. No palpable supraclavicular or cervical adenopathy. Normal sized without goiter. Respiratory WNL. No retractions.. Cardiovascular Pedal Pulses WNL. the left ABI 0.44. is no evidence of any lymphedema. Chest Breasts symmetical and no nipple discharge.. Breast tissue WNL, no masses, lumps, or tenderness.. Gastrointestinal (GI) Abdomen without masses or tenderness.. No liver or spleen enlargement or tenderness.. Lymphatic No adneopathy. No adenopathy. No adenopathy. Musculoskeletal Adexa without tenderness or enlargement.. Digits and nails w/o clubbing, cyanosis, infection, petechiae, ischemia, or inflammatory  conditions.Marland Kitchen Psychiatric Judgement and insight Intact.. No evidence of depression, anxiety, or agitation.. General Notes: on the posterior left heel in the region of the calcaneus he's got a lot of necrotic debris which was sharply removed with forcep and a 50 number blade and this was restricted only to the necrotic debris which goes right down to the calcaneal bone and this is easily probed. No surrounding soft tissue was debrided due to his poor circulation with a ABI of 0.44 Integumentary (Hair, Skin) No suspicious lesions. No crepitus or fluctuance. No peri-wound warmth or erythema. No masses.. Wound #1 status is Open. Original cause of wound was Pressure Injury. The wound is located on the Left Calcaneus. The wound measures 2.8cm length x 3.5cm width x 0.3cm depth; 7.697cm^2 area and 2.309cm^3 volume. There is no tunneling or undermining noted. There is a large amount of purulent drainage noted. The wound margin is distinct with the outline attached to the wound base. There is small (1-33%) red granulation within the wound bed. There is a large (67-100%) amount of necrotic tissue within the wound bed including Eschar and Adherent Slough. Periwound temperature was noted as No Abnormality. The periwound has tenderness on palpation. Assessment KAZ, AULD ([id number]) Active Problems ICD-10 [phone] - Pressure ulcer of left heel, 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 - Peripheral vascular disease, unspecified F17.218 - Nicotine dependence, cigarettes, with other nicotine-induced disorders this 76 year old gentleman who is a reformed alcoholic but continues to smoke has his  power of attorney with him today due to diminished mental capabilities after a cerebral aneurysm repair in 1997. After review I have recommended: 1. Santyl ointment to be applied daily and covered with a heel cup 2. Offloading has been discussed in great detail 3. A posterior offloading shoe has been provided 4. I have spent over 3  minutes discussing with him the need to completely give up smoking and have discussed the methodology of doing so 5. X-ray of the left foot 6. Arterial duplex study of the left lower extremity 7. regular visits the wound center The power of attorney has been very compliant and all questions have been answered. Procedures Wound #1 Wound #1 is a Pressure Ulcer located on the Left Calcaneus . There was a Skin/Subcutaneous Tissue Debridement ([id number]) debridement with total area of 9.8 sq cm performed by Christin Fudge, MD. with the following instrument(s): Blade and Forceps to remove Viable and Non-Viable tissue/material including Exudate, Fibrin/Slough, and Subcutaneous after achieving pain control using Lidocaine 4% Topical Solution. A time out was conducted at 10:29, prior to the start of the procedure. A Minimum amount of bleeding was controlled with Pressure. The procedure was tolerated well with a pain level of 0 throughout and a pain level of 0 following the procedure. Post Debridement Measurements: 2.8cm length x 3.5cm width x 1.5cm depth; 11.545cm^3 volume. Post debridement Stage noted as Category/Stage IV. Character of Wound/Ulcer Post Debridement requires further debridement. Severity of Tissue Post Debridement is: Fat layer exposed. Post procedure Diagnosis Wound #1: Same as Pre-Procedure General Notes: after sharp debridement of all the necrotic debris the calcaneus bone is palpable. Plan ALEXI, GEIBEL D. ([id number]) Wound Cleansing: Wound #1 Left Calcaneus: Clean wound with Normal Saline. Anesthetic: Wound #1 Left Calcaneus: Topical Lidocaine 4% cream applied to wound bed prior to debridement - clinic use Primary Wound Dressing: Wound #1 Left Calcaneus: Santyl Ointment Medihoney gel - if unable to get santyl Plain packing gauze - with the santyl into cavity Secondary Dressing: Wound #1 Left Calcaneus: Dry Gauze Conform/Kerlix Other - heel cup, tape Dressing Change  Frequency: Wound #1 Left Calcaneus: Change dressing every day. Follow-up Appointments: Wound #1 Left Calcaneus: Return Appointment in 1 week. Off-Loading: Wound #1 Left Calcaneus: Turn and reposition every 2 hours Other: - keep pressure off the heel Additional Orders / Instructions: Wound #1 Left Calcaneus: Stop Smoking Increase protein intake. Home Health: Wound #1 Left Calcaneus: Continue Home Health Visits - [facility] Nurse may visit PRN to address patient s wound care needs. FACE TO FACE ENCOUNTER: MEDICARE and MEDICAID PATIENTS: I certify that this patient is under my care and that I had a face-to-face encounter that meets the physician face-to-face encounter requirements with this patient on this date. The encounter with the patient was in whole or in part for the following MEDICAL CONDITION: (primary reason for [place]) MEDICAL NECESSITY: I certify, that based on my findings, NURSING services are a medically necessary home health service. HOME BOUND STATUS: I certify that my clinical findings support that this patient is homebound (i.e., Due to illness or injury, pt requires aid of supportive devices such as crutches, cane, wheelchairs, walkers, the use of special transportation or the assistance of another person to leave their place of residence. There is a normal inability to leave the home and doing so requires considerable and taxing effort. Other absences are for medical reasons / religious services and are infrequent or of short duration when for other reasons). If current dressing causes  regression in wound condition, may D/C ordered dressing product/s and apply Normal Saline Moist Dressing daily until next [place] / Other MD appointment. [place] of regression in wound condition at [phone]. Please direct any NON-WOUND related issues/requests for orders to patient's Primary Care Physician Medications-please add to  medication list.: Wound #1 Left Calcaneus: MUNEEB, VERAS D. ([id number]) Santyl Enzymatic Ointment Other: - vitamin c, vitamin a, zinc, MVI Radiology ordered were: X-ray, heel - left Services and Therapies ordered were: Arterial Studies- Bilateral - Dr. Fletcher Anon The following medication(s) was prescribed: Santyl topical 250 unit/gram ointment ointment topical as directed starting 06/26/2016 this 76 year old gentleman who is a reformed alcoholic but continues to smoke has his power of attorney with him today due to diminished mental capabilities after a cerebral aneurysm repair in 1997. After review I have recommended: 1. Santyl ointment to be applied daily and covered with a heel cup 2. Offloading has been discussed in great detail 3. A posterior offloading shoe has been provided 4. I have spent over 3 minutes discussing with him the need to completely give up smoking and have discussed the methodology of doing so 5. X-ray of the left foot 6. Arterial duplex study of the left lower extremity 7. regular visits the wound center The power of attorney has been very compliant and all questions have been answered. Electronic Signature(s) Signed: 06/26/2016 11:21:39 AM By: Christin Fudge MD, FACS Previous Signature: 06/26/2016 11:20:25 AM Version By: Christin Fudge MD, FACS Entered By: Christin Fudge on 06/26/2016 11:21:39 MICHAEL, WALRATH DMarland Kitchen ([id number]) -------------------------------------------------------------------------------- ROS/PFSH Details Patient Name: Jeremy Gang D. Date of Service: 06/26/2016 9:45 AM Medical Record Number: [id number] Patient Account Number: [id number] Date of Birth/Sex: 02/06/1941 (76 y.o. Male) Treating RN: Ahmed Prima Primary Care Provider: [name] Other Clinician: Referring Provider: [name] Treating Provider/Extender: Frann Rider in Treatment: 0 Wound History Do you currently have one or more open woundso Yes How many open wounds do you  currently haveo 1 Approximately how long have you had your woundso month How have you been treating your wound(s) until nowo unsure Has your wound(s) ever healed and then re-openedo No Have you had any lab work done in the past montho No Have you tested positive for an antibiotic resistant organism (MRSA, VRE)o No Have you tested positive for osteomyelitis (bone infection)o No Have you had any tests for circulation on your legso No Have you had other problems associated with your woundso Swelling Eyes Complaints and Symptoms: Positive for: Glasses / Contacts Constitutional Symptoms (General Health) Complaints and Symptoms: No Complaints or Symptoms Hematologic/Lymphatic Complaints and Symptoms: Review of System Notes: aneurysm hyperchloremia hyperpotassemia Respiratory Medical History: Positive for: Chronic Obstructive Pulmonary Disease (COPD) Cardiovascular Complaints and Symptoms: Review of System Notes: Hypercholesterol Medical HistoryLAKEN, LOBATO ([id number]) Positive for: Arrhythmia - a-fib; Hypertension Genitourinary Complaints and Symptoms: Review of System Notes: CKD III hypertensive renal disease Neurologic Medical History: Positive for: Seizure Disorder Family and Social History Cancer: Yes - Mother; Heart Disease: Yes - Father, Siblings, Siblings; Current some day smoker - 2-3 a day; Marital Status - Single; 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  Electronic Signature(s) Signed: 06/26/2016 4:00:38 PM By: Christin Fudge MD, FACS Signed:  06/27/2016 4:34:11 PM By: Alric Quan Entered By: Christin Fudge on 06/26/2016 11:10:46 Scarpulla, Thijs D. ([id number]) -------------------------------------------------------------------------------- SuperBill Details Patient Name: Jeremy Gang D. Date of Service: 06/26/2016 Medical Record Number: [id number] Patient Account Number: [id number] Date of Birth/Sex: 05/05/1940 (76 y.o. 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, cigarettes, with other nicotine-induced disorders Facility Procedures CPT4 Code Description: [id number] 99213 - WOUND CARE VISIT-LEV 3 EST PT Modifier: Quantity: 1 CPT4 Code Description: [id number] 11042 - DEB SUBQ TISSUE 20 SQ CM/< ICD-10 Description Diagnosis 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, 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 Peripheral vascular disease, unspecified Modifier: Quantity: 1 CPT4 Code Description: [id number] 99406-SMOKING CESSATION 3-10MINS ICD-10 Description Diagnosis 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, stage 4 F17.218 Nicotine dependence, cigarettes, with other nicoti Modifier: ne-induced di Quantity: 1 sorders Physician Procedures CPT4 Code Description: 7544920 10071 - WC PHYS LEVEL 4 - NEW PT ICD-10 Description Diagnosis 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, 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

## 2016-07-03 ENCOUNTER — Other Ambulatory Visit: Payer: Self-pay | Admitting: *Deleted

## 2016-07-03 ENCOUNTER — Other Ambulatory Visit: Payer: Self-pay

## 2016-07-03 ENCOUNTER — Encounter: Payer: Medicare Other | Admitting: Surgery

## 2016-07-03 DIAGNOSIS — L89624 Pressure ulcer of left heel, stage 4: Secondary | ICD-10-CM | POA: Diagnosis not present

## 2016-07-03 MED ORDER — DIVALPROEX SODIUM 250 MG PO DR TAB: DELAYED_RELEASE_TABLET | ORAL | 1 refills | Status: DC

## 2016-07-03 MED ORDER — THEREMS-M PO TABS: 1.0000 | ORAL_TABLET | Freq: Every day | ORAL | 3 refills | Status: DC

## 2016-07-03 MED ORDER — LEVETIRACETAM 500 MG PO TABS: ORAL_TABLET | ORAL | 1 refills | Status: DC

## 2016-07-03 MED ORDER — ATORVASTATIN CALCIUM 10 MG PO TABS: ORAL_TABLET | ORAL | 1 refills | Status: DC

## 2016-07-03 MED ORDER — CVS B-1 100 MG PO TABS
100.0000 mg | ORAL_TABLET | Freq: Every day | ORAL | 0 refills | Status: DC
Start: 2016-07-03 — End: 2016-08-02

## 2016-07-03 MED ORDER — CARVEDILOL 6.25 MG PO TABS: 6.2500 mg | ORAL_TABLET | Freq: Two times a day (BID) | ORAL | 0 refills | Status: DC

## 2016-07-03 MED ORDER — LORAZEPAM 0.5 MG PO TABS: 0.5000 mg | ORAL_TABLET | Freq: Two times a day (BID) | ORAL | 0 refills | Status: DC

## 2016-07-03 MED ORDER — FUROSEMIDE 40 MG PO TABS: ORAL_TABLET | ORAL | 0 refills | Status: DC

## 2016-07-03 NOTE — Telephone Encounter (Signed)
Patient daughter requested Rx

## 2016-07-03 NOTE — Telephone Encounter (Signed)
Patients POA called and requested refills on meds.

## 2016-07-04 NOTE — Progress Notes (Signed)
Jeremy, [name] ([id number]) Visit Report for 07/03/2016 Arrival Information Details Patient Name: [name], [name] Date of Service: 07/03/2016 11:00 AM Medical Record Number: [id number] Patient Account Number: [id number] Date of Birth/Sex: 05/30/1940 (76 y.o. Male) Treating RN: Ahmed Prima Primary Care Irmalee Riemenschneider: [name] Other Clinician: Referring Charolett Yarrow: [name] Treating Maralee Higuchi/Extender: Frann Rider in Treatment: 1 Visit Information History Since Last Visit All ordered tests and consults were completed: No Patient Arrived: Ambulatory Added or deleted any medications: No Arrival Time: 11:04 Any new allergies or adverse reactions: No Accompanied By: caregiver Had a fall or experienced change in No Transfer Assistance: None activities of daily living that may affect Patient Identification Verified: Yes risk of falls: Secondary Verification Process Yes Signs or symptoms of abuse/neglect since last No Completed: visito Patient Requires Transmission-Based No Hospitalized since last visit: No Precautions: Has Dressing in Place as Prescribed: Yes Patient Has Alerts: No Has Compression in Place as Prescribed: Yes Pain Present Now: No Electronic Signature(s) Signed: 07/03/2016 5:02:31 PM By: Alric Quan Entered By: Alric Quan on 07/03/2016 11:05:01 Feltus, Tommaso D. ([id number]) -------------------------------------------------------------------------------- Encounter Discharge Information Details Patient Name: Jeremy Gang D. Date of Service: 07/03/2016 11:00 AM Medical Record Number: [id number] Patient Account Number: [id number] Date of Birth/Sex: [birth year] (76 y.o. Male) Treating RN: Ahmed Prima Primary Care [name]: [name] Other Clinician: Referring Latonya Nelon: [name] Treating Jawuan Robb/Extender: Frann Rider in Treatment: 1 Encounter Discharge Information Items Discharge Pain Level: 0 Discharge Condition:  Stable Ambulatory Status: Ambulatory Discharge Destination: Home Transportation: Private Auto Accompanied By: caregiver Schedule Follow-up Appointment: Yes Medication Reconciliation completed and provided to Patient/Care No Antrone [place]: Provided on Clinical Summary of Care: 07/03/2016 Form Type Recipient Paper Patient EM Electronic Signature(s) Signed: 07/03/2016 5:02:31 PM By: Alric Quan Previous Signature: 07/03/2016 11:33:31 AM Version By: Ruthine Dose Entered By: Alric Quan on 07/03/2016 11:34:47 Tow, Kia D. ([id number]) -------------------------------------------------------------------------------- Lower Extremity Assessment Details Patient Name: Jeremy Gang D. Date of Service: 07/03/2016 11:00 AM Medical Record Number: [id number] Patient Account Number: [id number] Date of Birth/Sex: [birth year] (76 y.o. Male) Treating RN: Ahmed Prima Primary Care Jefte Carithers: [name] Other Clinician: Referring Zalika Tieszen: [name] Treating Jasiel Belisle/Extender: Frann Rider in Treatment: 1 Vascular Assessment Pulses: Dorsalis Pedis Palpable: [Left:No] Doppler Audible: [Left:Yes] Posterior Tibial Extremity colors, hair growth, and conditions: Extremity Color: [Left:Hyperpigmented] Temperature of Extremity: [Left:Cool] Capillary Refill: [Left:< 3 seconds] Electronic Signature(s) Signed: 07/03/2016 5:02:31 PM By: Alric Quan Entered By: Alric Quan on 07/03/2016 11:14:21 Ballin, Aundrey D. ([id number]) -------------------------------------------------------------------------------- Multi Wound Chart Details Patient Name: Jeremy Gang D. Date of Service: 07/03/2016 11:00 AM Medical Record Number: [id number] Patient Account Number: [id number] Date of Birth/Sex: Oct 20, 1940 (76 y.o. Male) Treating RN: Ahmed Prima Primary Care Mizael Sagar: [name] Other Clinician: Referring Pansey Pinheiro: [name] Treating Tae Robak/Extender: Frann Rider in Treatment: 1 Vital Signs Height(in): Pulse(bpm): 51 Weight(lbs): 128.8 Blood Pressure 144/68 (mmHg): Body Mass Index(BMI): Temperature(F): 98.0 Respiratory Rate 18 (breaths/min): Photos: [1:No Photos] [N/A:N/A] Wound Location: [1:Left Calcaneus] [N/A:N/A] Wounding Event: [1:Pressure Injury] [N/A:N/A] Primary Etiology: [1:Pressure Ulcer] [N/A:N/A] Comorbid History: [1:Chronic Obstructive Pulmonary Disease (COPD), Arrhythmia, Hypertension, Seizure Disorder] [N/A:N/A] Date Acquired: [1:05/08/2016] [N/A:N/A] Weeks of Treatment: [1:1] [N/A:N/A] Wound Status: [1:Open] [N/A:N/A] Measurements L x W x D 2x3.2x0.6 [N/A:N/A] (cm) Area (cm) : [1:5.027] [N/A:N/A] Volume (cm) : [1:3.016] [N/A:N/A] % Reduction in Area: [1:34.70%] [N/A:N/A] % Reduction in Volume: -30.60% [N/A:N/A] 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] [N/A:N/A] Exudate Amount: [1:Large] [N/A:N/A] Exudate Type: [1:Serous] [N/A:N/A] Exudate Color: [1:amber] [N/A:N/A] Foul Odor After [1:Yes] [N/A:N/A] Cleansing: Odor Anticipated Due to  No [N/A:N/A] Product Use: Wound Margin: [1:Distinct, outline attached] [N/A:N/A] Granulation Amount: [1:Large (67-100%)] [N/A:N/A] Granulation Quality: [1:Red] [N/A:N/A] Necrotic Amount: [1:Small (1-33%)] [N/A:N/A] Epithelialization: None N/A N/A Debridement: Debridement (31517- N/A N/A 11047) Pre-procedure 11:20 N/A N/A Verification/Time Out Taken: Pain Control: Lidocaine 4% Topical N/A N/A Solution Tissue Debrided: Fibrin/Slough, Exudates, N/A N/A Subcutaneous Level: Skin/Subcutaneous N/A N/A Tissue Debridement Area (sq 6.4 N/A N/A cm): Instrument: Curette N/A N/A Bleeding: Minimum N/A N/A Hemostasis Achieved: Pressure N/A N/A Procedural Pain: 0 N/A N/A Post Procedural Pain: 0 N/A N/A Debridement Treatment Procedure was tolerated N/A N/A Response: well Post Debridement 2x3.2x0.9 N/A N/A Measurements L x W x D (cm) Post Debridement 4.524 N/A N/A Volume:  (cm) Post Debridement Category/Stage III N/A N/A Stage: Periwound Skin Texture: No Abnormalities Noted N/A N/A Periwound Skin No Abnormalities Noted N/A N/A Moisture: Periwound Skin Color: No Abnormalities Noted N/A N/A Temperature: No Abnormality N/A N/A Tenderness on Yes N/A N/A Palpation: Wound Preparation: Ulcer Cleansing: N/A N/A Rinsed/Irrigated with Saline Topical Anesthetic Applied: Other: lidocaine 4% Procedures Performed: Debridement N/A N/A Treatment Notes Wound #1 (Left Calcaneus) 1. Cleansed with: Clean wound with Normal Saline 2. Anesthetic [place], Jamason D. ([id number]) Topical Lidocaine 4% cream to wound bed prior to debridement 4. Dressing Applied: Santyl Ointment Plain packing gauze 5. Secondary Dressing Applied Dry Gauze Kerlix/Conform 7. Secured with Tape Notes heel cup Electronic Signature(s) Signed: 07/03/2016 11:33:14 AM By: Christin Fudge MD, FACS Entered By: Christin Fudge on 07/03/2016 11:33:13 JOSEJUAN, HOAGLIN D. ([id number]) -------------------------------------------------------------------------------- Elsa Details Patient Name: CLEOFAS, HUDGINS D. Date of Service: 07/03/2016 11:00 AM Medical Record Number: [id number] Patient Account Number: [id number] Date of Birth/Sex: 1940-11-06 (76 y.o. Male) Treating RN: Ahmed Prima Primary Care Reena Borromeo: [name] Other Clinician: Referring Deania Siguenza: [name] Treating Rosellen Lichtenberger/Extender: Frann Rider in Treatment: 1 Active Inactive ` Abuse / Safety / Falls / Self Care Management Nursing Diagnoses: Potential for falls Goals: Patient will remain injury free Date Initiated: 06/26/2016 Target Resolution Date: 10/11/2016 Goal Status: Active Interventions: Assess fall risk on admission and as needed Assess self care needs on admission and as needed Notes: ` Nutrition Nursing Diagnoses: Imbalanced nutrition Goals: Patient/caregiver agrees to and verbalizes  understanding of need to use nutritional supplements and/or vitamins as prescribed Date Initiated: 06/26/2016 Target Resolution Date: 10/11/2016 Goal Status: Active Interventions: Assess patient nutrition upon admission and as needed per policy Notes: ` Orientation to the Wound Care Program Nursing Diagnoses: DARSH, VANDEVOORT ([id number]) Knowledge deficit related to the wound healing center program Goals: Patient/caregiver will verbalize understanding of the [facility] Date Initiated: 06/26/2016 Target Resolution Date: 07/12/2016 Goal Status: Active Interventions: Provide education on orientation to the wound center Notes: ` Pain, Acute or Chronic Nursing Diagnoses: Pain, acute or chronic: actual or potential Potential alteration in comfort, pain Goals: Patient/caregiver will verbalize adequate pain control between visits Date Initiated: 06/26/2016 Target Resolution Date: 10/11/2016 Goal Status: Active Interventions: Assess comfort goal upon admission Complete pain assessment as per visit requirements Notes: ` Wound/Skin Impairment Nursing Diagnoses: Impaired tissue integrity Knowledge deficit related to smoking impact on wound healing Goals: Ulcer/skin breakdown will have a volume reduction of 80% by week 12 Date Initiated: 06/26/2016 Target Resolution Date: 09/06/2016 Goal Status: Active Interventions: Assess patient/caregiver ability to perform ulcer/skin care regimen upon admission and as needed Assess ulceration(s) every visit Provide education on smoking JONUS, COBLE ([id number]) Notes: Electronic Signature(s) Signed: 07/03/2016 5:02:31 PM By: Alric Quan Entered By: Alric Quan on 07/03/2016 11:14:28 Isaacs, Balthazar D. (  [id number]) -------------------------------------------------------------------------------- Pain Assessment Details Patient Name: XAVIER, MUNGER. Date of Service: 07/03/2016 11:00 AM Medical Record Number: [id number] Patient  Account Number: [id number] Date of Birth/Sex: 02/15/1941 (76 y.o. Male) Treating RN: Ahmed Prima Primary Care Lazara Grieser: [name] Other Clinician: Referring Allean Montfort: [name] Treating Kdyn Vonbehren/Extender: Frann Rider in Treatment: 1 Active Problems Location of Pain Severity and Description of Pain Patient Has Paino No Site Locations With Dressing Change: No Pain Management and Medication Current Pain Management: Electronic Signature(s) Signed: 07/03/2016 5:02:31 PM By: Alric Quan Entered By: Alric Quan on 07/03/2016 11:05:08 Inniss, Alease Medina D. ([id number]) -------------------------------------------------------------------------------- Patient/Caregiver Education Details Patient Name: Jeremy Gang D. Date of Service: 07/03/2016 11:00 AM Medical Record Number: [id number] Patient Account Number: [id number] Date of Birth/Gender: 07/30/1940 (76 y.o. Male) Treating RN: Ahmed Prima Primary Care Physician: [name] Other Clinician: Referring Physician: [name] Treating Physician/Extender: Frann Rider in Treatment: 1 Education Assessment Education Provided To: Patient Education Topics Provided Wound/Skin Impairment: Handouts: Other: change dressing as ordered Methods: Demonstration, Explain/Verbal Responses: State content correctly Electronic Signature(s) Signed: 07/03/2016 5:02:31 PM By: Alric Quan Entered By: Alric Quan on 07/03/2016 11:35:00 Figge, Jaydan D. ([id number]) -------------------------------------------------------------------------------- Wound Assessment Details Patient Name: Jeremy Gang D. Date of Service: 07/03/2016 11:00 AM Medical Record Number: [id number] Patient Account Number: [id number] Date of Birth/Sex: 19-Apr-1940 (76 y.o. Male) Treating RN: Ahmed Prima Primary Care Jayleena Stille: [name] Other Clinician: Referring Kregg Cihlar: [name] Treating Lafaye Mcelmurry/Extender: Frann Rider in Treatment: 1 Wound Status Wound Number: 1 Primary Pressure Ulcer Etiology: Wound Location: Left Calcaneus Wound Open Wounding Event: Pressure Injury Status: Date Acquired: 05/08/2016 Comorbid Chronic Obstructive Pulmonary Weeks Of Treatment: 1 History: Disease (COPD), Arrhythmia, Clustered Wound: No Hypertension, Seizure Disorder Photos Photo Uploaded By: Alric Quan on 07/03/2016 15:42:09 Wound Measurements Length: (cm) 2 Width: (cm) 3.2 Depth: (cm) 0.6 Area: (cm) 5.027 Volume: (cm) 3.016 % Reduction in Area: 34.7% % Reduction in Volume: -30.6% Epithelialization: None Tunneling: No Undermining: No Wound Description Classification: Category/Stage III Foul Odor Aft Wound Margin: Distinct, outline attached Due to Produc Exudate Amount: Large Slough/Fibrin Exudate Type: Serous Exudate Color: amber er Cleansing: Yes t Use: No o Yes Wound Bed Granulation Amount: Large (67-100%) Granulation Quality: Red Necrotic Amount: Small (1-33%) Necrotic Quality: Adherent [address], John D. ([id number]) Periwound Skin Texture Texture Color No Abnormalities Noted: No No Abnormalities Noted: No Moisture Temperature / Pain No Abnormalities Noted: No Temperature: No Abnormality Tenderness on Palpation: Yes Wound Preparation Ulcer Cleansing: Rinsed/Irrigated with Saline Topical Anesthetic Applied: Other: lidocaine 4%, Treatment Notes Wound #1 (Left Calcaneus) 1. Cleansed with: Clean wound with Normal Saline 2. Anesthetic Topical Lidocaine 4% cream to wound bed prior to debridement 4. Dressing Applied: Santyl Ointment Plain packing gauze 5. Secondary Dressing Applied Dry Gauze Kerlix/Conform 7. Secured with Tape Notes heel cup Electronic Signature(s) Signed: 07/03/2016 5:02:31 PM By: Alric Quan Entered By: Alric Quan on 07/03/2016 11:12:06 Mikowski, Irven D.  ([id number]) -------------------------------------------------------------------------------- Vitals Details Patient Name: Jeremy Gang D. Date of Service: 07/03/2016 11:00 AM Medical Record Number: [id number] Patient Account Number: [id number] Date of Birth/Sex: 06/12/1940 (76 y.o. Male) Treating RN: Ahmed Prima Primary Care Nielle Duford: [name] Other Clinician: Referring Jaquelynn Wanamaker: [name] Treating Rickey Farrier/Extender: Frann Rider in Treatment: 1 Vital Signs Time Taken: 11:05 Temperature (F): 98.0 Weight (lbs): 128.8 Pulse (bpm): 51 Respiratory Rate (breaths/min): 18 Blood Pressure (mmHg): 144/68 Reference Range: 80 - 120 mg / dl Electronic Signature(s) Signed: 07/03/2016 5:02:31 PM By: Alric Quan Entered By: Alric Quan on 07/03/2016 11:07:06

## 2016-07-04 NOTE — Progress Notes (Addendum)
TAAHIR, GRISBY ([id number]) Visit Report for 07/03/2016 Chief Complaint Document Details Patient Name: [name], [name] Date of Service: 07/03/2016 11:00 AM Medical Record Number: [id number] Patient Account Number: [id number] Date of Birth/Sex: 1941-01-19 (76 y.o. Male) Treating RN: Ahmed Prima Primary Care Provider: [name] Other Clinician: Referring Provider: [name] Treating Provider/Extender: Frann Rider in Treatment: 1 Information Obtained from: Patient Chief Complaint Patient is at the clinic for treatment of an open pressure ulcer the left heel which she's had for about 2 months Electronic Signature(s) Signed: 07/03/2016 11:33:33 AM By: Christin Fudge MD, FACS Entered By: Christin Fudge on 07/03/2016 11:33:32 [name] ([id number]) -------------------------------------------------------------------------------- Debridement Details Patient Name: [name] Date of Service: 07/03/2016 11:00 AM Medical Record Number: [id number] Patient Account Number: [id number] Date of Birth/Sex: 1940/11/20 (76 y.o. Male) Treating RN: Ahmed Prima Primary Care Provider: [name] Other Clinician: Referring Provider: [name] Treating Provider/Extender: Frann Rider in Treatment: 1 Debridement Performed for Wound #1 Left Calcaneus Assessment: Performed By: Physician Christin Fudge, MD Debridement: Debridement Pre-procedure Yes - 11:20 Verification/Time Out Taken: Start Time: 11:21 Pain Control: Lidocaine 4% Topical Solution Level: Skin/Subcutaneous Tissue Total Area Debrided (L x 2 (cm) x 3.2 (cm) = 6.4 (cm) W): Tissue and other Viable, Non-Viable, Exudate, Fibrin/Slough, Subcutaneous material debrided: Instrument: Curette Bleeding: Minimum Hemostasis Achieved: Pressure End Time: 11:23 Procedural Pain: 0 Post Procedural Pain: 0 Response to Treatment: Procedure was tolerated well Post Debridement Measurements of Total  Wound Length: (cm) 2 Stage: Category/Stage III Width: (cm) 3.2 Depth: (cm) 0.9 Volume: (cm) 4.524 Character of Wound/Ulcer Post Requires Further Debridement: Debridement Severity of Tissue Post Fat layer exposed Debridement: Post Procedure Diagnosis Same as Pre-procedure Electronic Signature(s) Signed: 07/03/2016 11:33:24 AM By: Christin Fudge MD, FACS Signed: 07/03/2016 5:02:31 PM By: Alric Quan Entered By: Christin Fudge on 07/03/2016 11:33:23 [name] ([id number]) [name] ([id number]) -------------------------------------------------------------------------------- HPI Details Patient Name: [name] Date of Service: 07/03/2016 11:00 AM Medical Record Number: [id number] Patient Account Number: [id number] Date of Birth/Sex: 11/12/1940 (76 y.o. Male) Treating RN: Ahmed Prima Primary Care Provider: [name] Other Clinician: Referring Provider: [name] Treating Provider/Extender: Frann Rider in Treatment: 1 History of Present Illness Location: left heel ulceration Quality: Patient reports experiencing a dull pain to affected area(s). Severity: Patient states wound are getting worse. Duration: Patient has had the wound for > 2 months prior to seeking treatment at the wound center Timing: Pain in wound is Intermittent (comes and goes Context: The wound appeared gradually over time Modifying Factors: Other treatment(s) tried include:local care and offloading Associated Signs and Symptoms: Patient reports having difficulty standing for long periods. HPI Description: 76 year old gentleman who is a reformed alcoholic for about 6 months now was referred to as by his PCP team for a left heel ulceration which she's had for about 2 months. He has a history of alcohol abuse, aneurysm, hypertensive renal disease, anemia of chronic kidney disease, atrial fibrillation with RVR, COPD, hypertension, seizure disorders, tobacco abuse, cerebral  aneurysm repair in 1997 and some ice surgery. He continues to smoke about half a pack of cigarettes a day. the patient is not fully competent and has a part of attorney along with him today. 07/03/2016 -- x-ray of the left heel shows a soft tissue wound with no bony destruction or concerns for osteomyelitis. Electronic Signature(s) Signed: 07/03/2016 11:33:42 AM By: Christin Fudge MD, FACS Previous Signature: 07/03/2016 11:18:59 AM Version By: Christin Fudge MD, FACS Entered By: Christin Fudge on  07/03/2016 11:33:42 [name] ([id number]) -------------------------------------------------------------------------------- Physical Exam Details Patient Name: [name] Date of Service: 07/03/2016 11:00 AM Medical Record Number: [id number] Patient Account Number: [id number] Date of Birth/Sex: 25-Jan-1941 (76 y.o. Male) Treating RN: Ahmed Prima Primary Care Provider: [name] Other Clinician: Referring Provider: [name] Treating Provider/Extender: Frann Rider in Treatment: 1 Constitutional . Pulse regular. Respirations normal and unlabored. Afebrile. . Eyes Nonicteric. Reactive to light. Ears, Nose, Mouth, and Throat Lips, teeth, and gums WNL.Marland Kitchen Moist mucosa without lesions. Neck supple and nontender. No palpable supraclavicular or cervical adenopathy. Normal sized without goiter. Respiratory WNL. No retractions.. Cardiovascular Pedal Pulses WNL. No clubbing, cyanosis or edema. Lymphatic No adneopathy. No adenopathy. No adenopathy. Musculoskeletal Adexa without tenderness or enlargement.. Digits and nails w/o clubbing, cyanosis, infection, petechiae, ischemia, or inflammatory conditions.. Integumentary (Hair, Skin) No suspicious lesions. No crepitus or fluctuance. No peri-wound warmth or erythema. No masses.Marland Kitchen Psychiatric Judgement and insight Intact.. No evidence of depression, anxiety, or agitation.. Notes sharp debridement with a #3 curet was done to  remove the subcutaneous debris and this bleeding controlled with pressure. Electronic Signature(s) Signed: 07/03/2016 11:34:26 AM By: Christin Fudge MD, FACS Entered By: Christin Fudge on 07/03/2016 11:34:25 [name], Jeremy DMarland Kitchen ([id number]) -------------------------------------------------------------------------------- Physician Orders Details Patient Name: [name] Date of Service: 07/03/2016 11:00 AM Medical Record Number: [id number] Patient Account Number: [id number] Date of Birth/Sex: 09-Jun-1940 (76 y.o. Male) Treating RN: Ahmed Prima Primary Care Provider: [name] Other Clinician: Referring Provider: [name] Treating Provider/Extender: Frann Rider in Treatment: 1 Verbal / Phone Orders: Yes Clinician: Carolyne Fiscal, Debi Read Back and Verified: Yes Diagnosis Coding Wound Cleansing Wound #1 Left Calcaneus o Clean wound with Normal Saline. Anesthetic Wound #1 Left Calcaneus o Topical Lidocaine 4% cream applied to wound bed prior to debridement - clinic use Primary Wound Dressing Wound #1 Left Calcaneus o Santyl Ointment o Medihoney gel - if unable to get santyl o Plain packing gauze - with the santyl into cavity Secondary Dressing Wound #1 Left Calcaneus o Dry Gauze o Conform/Kerlix o Other - heel cup, tape Dressing Change Frequency Wound #1 Left Calcaneus o Change dressing every day. Follow-up Appointments Wound #1 Left Calcaneus o Return Appointment in 1 week. Off-Loading Wound #1 Left Calcaneus o Turn and reposition every 2 hours o Other: - keep pressure off the heel Additional Orders / Instructions Wound #1 Left Calcaneus Alewine, Damario [name]. ([id number]) o Stop Smoking o Increase protein intake. Home Health Wound #1 Left [place] Nurse may visit PRN to address patientos wound care needs. o FACE TO FACE ENCOUNTER: MEDICARE and MEDICAID PATIENTS: I  certify that this patient is under my care and that I had a face-to-face encounter that meets the physician face-to-face encounter requirements with this patient on this date. The encounter with the patient was in whole or in part for the following MEDICAL CONDITION: (primary reason for [place]) MEDICAL NECESSITY: I certify, that based on my findings, NURSING services are a medically necessary home health service. HOME BOUND STATUS: I certify that my clinical findings support that this patient is homebound (i.e., Due to illness or injury, pt requires aid of supportive devices such as crutches, cane, wheelchairs, walkers, the use of special transportation or the assistance of another person to leave their place of residence. There is a normal inability to leave the home and doing so requires considerable and taxing effort. Other absences are for medical reasons / religious  services and are infrequent or of short duration when for other reasons). o If current dressing causes regression in wound condition, may [name]/C ordered dressing product/s and apply Normal Saline Moist Dressing daily until next [place] / Other MD appointment. [place] of regression in wound condition at [phone]. o Please direct any NON-WOUND related issues/requests for orders to patient's Primary Care Physician Medications-please add to medication list. Wound #1 Left Calcaneus o Santyl Enzymatic Ointment o Other: - vitamin c, vitamin a, zinc, MVI Electronic Signature(s) Signed: 07/03/2016 3:13:18 PM By: Christin Fudge MD, FACS Signed: 07/03/2016 5:02:31 PM By: Alric Quan Entered By: Alric Quan on 07/03/2016 11:32:45 [name] ([id number]) -------------------------------------------------------------------------------- Problem List Details Patient Name: [name] Date of Service: 07/03/2016 11:00 AM Medical Record Number: [id number] Patient Account  Number: [id number] Date of Birth/Sex: 25-Aug-1940 (76 y.o. Male) Treating RN: Ahmed Prima Primary Care Provider: [name] Other Clinician: Referring Provider: [name] Treating Provider/Extender: Frann Rider in Treatment: 1 Active Problems ICD-10 Encounter Code Description Active Date Diagnosis [phone] Pressure ulcer of left heel, stage 4 06/26/2016 Yes I73.9 Peripheral vascular disease, unspecified 06/26/2016 Yes F17.218 Nicotine dependence, cigarettes, with other nicotine- 06/26/2016 Yes induced disorders Inactive Problems Resolved Problems Electronic Signature(s) Signed: 07/03/2016 11:33:09 AM By: Christin Fudge MD, FACS Entered By: Christin Fudge on 07/03/2016 11:33:08 Berti, Alease Medina [name]. ([id number]) -------------------------------------------------------------------------------- Progress Note Details Patient Name: [name] Date of Service: 07/03/2016 11:00 AM Medical Record Number: [id number] Patient Account Number: [id number] Date of Birth/Sex: 28-Jan-1941 (76 y.o. Male) Treating RN: Ahmed Prima Primary Care Provider: [name] Other Clinician: Referring Provider: [name] Treating Provider/Extender: Frann Rider in Treatment: 1 Subjective Chief Complaint Information obtained from Patient Patient is at the clinic for treatment of an open pressure ulcer the left heel which she's had for about 2 months History of Present Illness (HPI) The following HPI elements were documented for the patient's wound: Location: left heel ulceration Quality: Patient reports experiencing a dull pain to affected area(s). Severity: Patient states wound are getting worse. Duration: Patient has had the wound for > 2 months prior to seeking treatment at the wound center Timing: Pain in wound is Intermittent (comes and goes Context: The wound appeared gradually over time Modifying Factors: Other treatment(s) tried include:local care and  offloading Associated Signs and Symptoms: Patient reports having difficulty standing for long periods. 76 year old gentleman who is a reformed alcoholic for about 6 months now was referred to as by his PCP team for a left heel ulceration which she's had for about 2 months. He has a history of alcohol abuse, aneurysm, hypertensive renal disease, anemia of chronic kidney disease, atrial fibrillation with RVR, COPD, hypertension, seizure disorders, tobacco abuse, cerebral aneurysm repair in 1997 and some ice surgery. He continues to smoke about half a pack of cigarettes a day. the patient is not fully competent and has a part of attorney along with him today. 07/03/2016 -- x-ray of the left heel shows a soft tissue wound with no bony destruction or concerns for osteomyelitis. Objective Constitutional Pulse regular. Respirations normal and unlabored. Afebrile. Vitals Time Taken: 11:05 AM, Weight: 128.8 lbs, Temperature: 98.0 F, Pulse: 51 bpm, Respiratory Rate: 18 breaths/min, Blood Pressure: 144/68 mmHg. [name] ([id number]) Eyes Nonicteric. Reactive to light. Ears, Nose, Mouth, and Throat Lips, teeth, and gums WNL.Marland Kitchen Moist mucosa without lesions. Neck supple and nontender. No palpable supraclavicular or cervical adenopathy. Normal sized without goiter. Respiratory WNL. No retractions.. Cardiovascular Pedal Pulses WNL.  No clubbing, cyanosis or edema. Lymphatic No adneopathy. No adenopathy. No adenopathy. Musculoskeletal Adexa without tenderness or enlargement.. Digits and nails w/o clubbing, cyanosis, infection, petechiae, ischemia, or inflammatory conditions.Marland Kitchen Psychiatric Judgement and insight Intact.. No evidence of depression, anxiety, or agitation.. General Notes: sharp debridement with a #3 curet was done to remove the subcutaneous debris and this bleeding controlled with pressure. Integumentary (Hair, Skin) No suspicious lesions. No crepitus or fluctuance. No peri-wound  warmth or erythema. No masses.. Wound #1 status is Open. Original cause of wound was Pressure Injury. The wound is located on the Left Calcaneus. The wound measures 2cm length x 3.2cm width x 0.6cm depth; 5.027cm^2 area and 3.016cm^3 volume. There is no tunneling or undermining noted. There is a large amount of serous drainage noted. The wound margin is distinct with the outline attached to the wound base. There is large (67-100%) red granulation within the wound bed. There is a small (1-33%) amount of necrotic tissue within the wound bed including Adherent Slough. Periwound temperature was noted as No Abnormality. The periwound has tenderness on palpation. Assessment Active Problems ICD-10 [name] ([id number]) [phone] - Pressure ulcer of left heel, 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 - Peripheral vascular disease, unspecified F17.218 - Nicotine dependence, cigarettes, with other nicotine-induced disorders Procedures Wound #1 Wound #1 is a Pressure Ulcer located on the Left Calcaneus . There was a Skin/Subcutaneous Tissue Debridement ([id number]) debridement with total area of 6.4 sq cm performed by Christin Fudge, MD. with the following instrument(s): Curette to remove Viable and Non-Viable tissue/material including Exudate, Fibrin/Slough, and Subcutaneous after achieving pain control using Lidocaine 4% Topical Solution. A time out was conducted at 11:20, prior to the start of the procedure. A Minimum amount of bleeding was controlled with Pressure. The procedure was tolerated well with a pain level of 0 throughout and a pain level of 0 following the procedure. Post Debridement Measurements: 2cm length x 3.2cm width x 0.9cm depth; 4.524cm^3 volume. Post debridement Stage noted as Category/Stage III. Character of Wound/Ulcer Post Debridement requires further debridement. Severity of Tissue Post Debridement is: Fat layer exposed. Post procedure Diagnosis Wound #1: Same as Pre-Procedure Plan Wound  Cleansing: Wound #1 Left Calcaneus: Clean wound with Normal Saline. Anesthetic: Wound #1 Left Calcaneus: Topical Lidocaine 4% cream applied to wound bed prior to debridement - clinic use Primary Wound Dressing: Wound #1 Left Calcaneus: Santyl Ointment Medihoney gel - if unable to get santyl Plain packing gauze - with the santyl into cavity Secondary Dressing: Wound #1 Left Calcaneus: Dry Gauze Conform/Kerlix Other - heel cup, tape Dressing Change Frequency: Wound #1 Left Calcaneus: Change dressing every day. [name] ([id number]) Follow-up Appointments: Wound #1 Left Calcaneus: Return Appointment in 1 week. Off-Loading: Wound #1 Left Calcaneus: Turn and reposition every 2 hours Other: - keep pressure off the heel Additional Orders / Instructions: Wound #1 Left Calcaneus: Stop Smoking Increase protein intake. Home Health: Wound #1 Left Calcaneus: Continue Home Health Visits - [facility] Nurse may visit PRN to address patient s wound care needs. FACE TO FACE ENCOUNTER: MEDICARE and MEDICAID PATIENTS: I certify that this patient is under my care and that I had a face-to-face encounter that meets the physician face-to-face encounter requirements with this patient on this date. The encounter with the patient was in whole or in part for the following MEDICAL CONDITION: (primary reason for [place]) MEDICAL NECESSITY: I certify, that based on my findings, NURSING services are a medically necessary home health service. HOME BOUND STATUS: I  certify that my clinical findings support that this patient is homebound (i.e., Due to illness or injury, pt requires aid of supportive devices such as crutches, cane, wheelchairs, walkers, the use of special transportation or the assistance of another person to leave their place of residence. There is a normal inability to leave the home and doing so requires considerable and taxing effort. Other absences are for medical  reasons / religious services and are infrequent or of short duration when for other reasons). If current dressing causes regression in wound condition, may [name]/C ordered dressing product/s and apply Normal Saline Moist Dressing daily until next [place] / Other MD appointment. [place] of regression in wound condition at [phone]. Please direct any NON-WOUND related issues/requests for orders to patient's Primary Care Physician Medications-please add to medication list.: Wound #1 Left Calcaneus: Santyl Enzymatic Ointment Other: - vitamin c, vitamin a, zinc, MVI After review I have recommended: 1. Santyl ointment to be applied daily and covered with a heel cup 2. Offloading has been discussed in great detail 3. A posterior offloading shoe has been provided 4. I have spent over 3 minutes discussing with him the need to completely give up smoking and have discussed the methodology of doing so 5. X-ray of the left foot --results discussed with the patient and his family 6. Arterial duplex study of the left lower extremity -- appointment still pending 7. regular visits the wound center Ferreras, [place]. ([id number]) The power of attorney has been very compliant and all questions have been answered. Electronic Signature(s) Signed: 07/03/2016 11:35:22 AM By: Christin Fudge MD, FACS Entered By: Christin Fudge on 07/03/2016 11:35:22 YUNIOR, JAIN [name]. ([id number]) -------------------------------------------------------------------------------- SuperBill Details Patient Name: [name] Date of Service: 07/03/2016 Medical Record Number: [id number] Patient Account Number: [id number] Date of Birth/Sex: 04-02-1941 (76 y.o. Male) Treating RN: Ahmed Prima Primary Care Provider: [name] Other Clinician: Referring Provider: [name] Treating Provider/Extender: Frann Rider in Treatment: 1 Diagnosis Coding ICD-10 Codes Code Description [phone]  Pressure ulcer of left heel, 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 Peripheral vascular disease, unspecified F17.218 Nicotine dependence, cigarettes, with other nicotine-induced disorders Facility Procedures CPT4 Code Description: [id number] 11042 - DEB SUBQ TISSUE 20 SQ CM/< ICD-10 Description Diagnosis 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, 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 Peripheral vascular disease, unspecified F17.218 Nicotine dependence, cigarettes, with other nicoti Modifier: ne-induced di Quantity: 1 sorders Physician Procedures CPT4 Code Description: [id number] 47998 - WC PHYS LEVEL 3 - EST PT ICD-10 Description Diagnosis 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, 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 Peripheral vascular disease, unspecified F17.218 Nicotine dependence, cigarettes, with other nicoti Modifier: ne-induced di Quantity: 1 sorders CPT4 Code Description: [id number] 11042 - WC PHYS SUBQ TISS 20 SQ CM ICD-10 Description Diagnosis 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, 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 Peripheral vascular disease, unspecified F17.218 Nicotine dependence, cigarettes, with other nicoti Modifier: ne-induced di Quantity: 1 sorders Electronic Signature(s) Signed: 07/03/2016 11:35:48 AM By: Christin Fudge MD, FACS TREVEON, BOURCIER [name]. ([id number]) Entered By: Christin Fudge on 07/03/2016 11:35:48

## 2016-07-08 ENCOUNTER — Ambulatory Visit (INDEPENDENT_AMBULATORY_CARE_PROVIDER_SITE_OTHER): Payer: Medicare Other | Admitting: Nurse Practitioner

## 2016-07-08 ENCOUNTER — Encounter: Payer: Self-pay | Admitting: Nurse Practitioner

## 2016-07-08 VITALS — BP 140/66 | HR 64 | Temp 97.6°F | Resp 16 | Ht 66.0 in | Wt 127.0 lb

## 2016-07-08 DIAGNOSIS — R21 Rash and other nonspecific skin eruption: Secondary | ICD-10-CM | POA: Diagnosis not present

## 2016-07-08 NOTE — Progress Notes (Signed)
Careteam: Patient Care Team: [name], NP as PCP - General (Nurse Practitioner)  Advanced Directive information Does Patient Have a Medical Advance Directive?: Yes, Type of Advance Directive: Healthcare Power of Attorney  Allergies  Allergen Reactions  . Diltiazem Rash and Other (See Comments)    Blisters  . Penicillins Other (See Comments)    Chief Complaint  Patient presents with  . Acute Visit    Rash on arms and chest. Review labs   . Medication Refill    No refills needed at this time      HPI: Patient is a 76 y.o. male seen in the office today due to rash, the rash has currently resolved but did not want to miss appt due to cost.   Forgetting to take bedtime medication a lot of the time. Has pill box today in office.   Following with wound care due to left heel wound, next appt tomorrow.   Review of Systems:  Review of Systems  Constitutional: Negative for activity change, appetite change, chills, fatigue and fever.  HENT: Negative for congestion and ear pain.   Eyes: Negative for visual disturbance.       Wears corrective lenses  Respiratory: Negative for cough and shortness of breath.   Cardiovascular: Negative for chest pain, palpitations and leg swelling.  Gastrointestinal: Negative for abdominal pain and constipation.  Genitourinary: Negative for dysuria.  Musculoskeletal: Negative for arthralgias.  Skin: Positive for wound. Negative for color change, pallor and rash.  Neurological: Negative for dizziness, seizures (hx of seizures but none recently), weakness and light-headedness.  Hematological: Negative for adenopathy.  Psychiatric/Behavioral: Negative for agitation, behavioral problems, self-injury and sleep disturbance. The patient is not nervous/anxious.    Past Medical History:  Diagnosis Date  . Alcohol abuse   . Aneurysm ([place])   . CKD (chronic kidney disease), stage III   . COPD (chronic obstructive pulmonary disease) ([place])   .  Hyperchloremia   . Hypercholesteremia   . Hyperpotassemia   . Hypertension   . Hypertensive renal disease, benign   . Seizures ([place])    has not had a seizure in 15 yrs  . Tobacco use    Past Surgical History:  Procedure Laterality Date  . 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, 1996  . EYE SURGERY     December 2017   Social History:   reports that he has quit smoking. He quit after 56.00 years of use. He has never used smokeless tobacco. He reports that he drinks alcohol. He reports that he does not use drugs.  Family History  Problem Relation Age of Onset  . Cancer Mother   . Congestive Heart Failure Father   . Alzheimer's disease Sister   . Congestive Heart Failure Sister   . Clotting disorder Sister   . Congestive Heart Failure Brother   . Clotting disorder Sister     Medications: Patient's Medications  New Prescriptions   No medications on file  Previous Medications   ATORVASTATIN (LIPITOR) 10 MG TABLET    TAKE 1 TABLET (10 MG TOTAL) BY MOUTH DAILY.   CARVEDILOL (COREG) 6.25 MG TABLET    Take 1 tablet (6.25 mg total) by mouth 2 (two) times daily with a meal.   [facility] ARTIFICIAL TEARS 1-0.3 % SOLN    PLACE 1 DROP IN EACHEYE EVERY 6 HOURS AS NEEDED FOR DRY EYES   [facility] B-1 100 MG TABLET    Take 1 tablet (100 mg total) by mouth daily.  DIVALPROEX (DEPAKOTE) 250 MG DR TABLET    TAKE 1 TABLET BY MOUTH 3 TIMES A DAY FOR SEIZURES   FUROSEMIDE (LASIX) 40 MG TABLET    TAKE 1 TABLET BY MOUTH ONCE DAILY FOR EDEMA   HYDROCHLOROTHIAZIDE (HYDRODIURIL) 25 MG TABLET    Take 25 mg by mouth daily.   LEVETIRACETAM (KEPPRA) 500 MG TABLET    Take one tablet by mouth twice daily   LORAZEPAM (ATIVAN) 0.5 MG TABLET    Take 1 tablet (0.5 mg total) by mouth every 12 (twelve) hours.   MULTIPLE VITAMINS-MINERALS (THEREMS-M) TABS    Take 1 tablet by mouth daily.  Modified Medications   No medications on file  Discontinued Medications   No medications on file     Physical Exam:  Vitals:   07/08/16  1034  BP: 140/66  Pulse: 64  Resp: 16  Temp: 97.6 F (36.4 C)  TempSrc: Oral  SpO2: 98%  Weight: 127 lb (57.6 kg)  Height: 5\' 6"  (1.676 m)   Body mass index is 20.5 kg/m.  Physical Exam  Constitutional: He is oriented to person, place, and time. He appears well-developed. No distress.  thin appearing  HENT:  Mouth/Throat: Oropharynx is clear and moist. No oropharyngeal exudate.  Neck: Normal range of motion.  Cardiovascular: Normal rate, regular rhythm, normal heart sounds and intact distal pulses.   Pulmonary/Chest: Effort normal and breath sounds normal. No respiratory distress.  Abdominal: Soft. Bowel sounds are normal.  Musculoskeletal: Normal range of motion. He exhibits edema (2+ to left leg, 1+ to right).  Neurological: He is alert and oriented to person, place, and time.  Skin: Skin is warm. No rash noted.  Psychiatric: His affect is labile. Cognition and memory are impaired. He exhibits abnormal recent memory.    Labs reviewed: Basic Metabolic Panel:  Recent Labs  04/14/16 1612  04/19/16 0548 04/25/16 2009 06/19/16 1051  NA 140  < > 135 138 144  K 3.8  < > 4.5 4.4 4.5  CL 103  < > 100* 108 107  CO2 26  < > 24 23 29   GLUCOSE 156*  < > 112* 109* 84  BUN 15  < > 25* 23* 16  CREATININE 1.41*  < > 1.87* 1.36* 1.10  CALCIUM 9.3  < > 8.7* 7.9* 9.0  MG 1.9  --   --   --   --   TSH 1.992  --   --   --   --   < > = values in this interval not displayed. Liver Function Tests:  Recent Labs  01/24/16 1115 04/25/16 2009 06/19/16 1051  AST 18 33 13  ALT 8* 30 6*  ALKPHOS 79 81 64  BILITOT 0.3 0.8 0.3  PROT 6.8 5.5* 6.7  ALBUMIN 4.0 2.1* 3.3*   No results for input(s): LIPASE, AMYLASE in the last 8760 hours. No results for input(s): AMMONIA in the last 8760 hours. CBC:  Recent Labs  01/24/16 1115  04/19/16 0548 04/25/16 2009 06/19/16 1051  WBC 4.5  < > 9.8 14.5* 5.5  NEUTROABS 1,800  --   --  9.3* 2,035  HGB 12.7*  < > 11.3* 8.7* 9.5*  HCT 37.4*  <  > 31.9* 26.2* 30.2*  MCV 93.7  < > 85.5 90.3 91.5  PLT 172  < > 113* 180 218  < > = values in this interval not displayed. Lipid Panel:  Recent Labs  10/15/15 1016 04/15/16 0211  CHOL 217* 164  HDL  107 103  LDLCALC 97 51  TRIG 64 48  CHOLHDL 2.0 1.6   TSH:  Recent Labs  04/14/16 1612  TSH 1.992   A1C: Lab Results  Component Value Date   HGBA1C 5.2 04/15/2016     Assessment/Plan 1. Rash Has resolved. To follow up as needed  2. Medication compliance -encouraged pt to take bedtime pill in evening- taking pills 4 times daily and bedtime medication is frequently forgotten. To call and clarify one of his pills which he takes (no markings noted on pill and medication in pill box)   Jordani Nunn K. Harle Battiest  [facility] [phone] 8 am - 5 pm) [phone] (after hours)

## 2016-07-10 ENCOUNTER — Encounter: Payer: Medicare Other | Attending: Surgery | Admitting: Surgery

## 2016-07-10 DIAGNOSIS — I129 Hypertensive chronic kidney disease with stage 1 through stage 4 chronic kidney disease, or unspecified chronic kidney disease: Secondary | ICD-10-CM | POA: Diagnosis not present

## 2016-07-10 DIAGNOSIS — Z880 Allergy status to penicillin: Secondary | ICD-10-CM | POA: Insufficient documentation

## 2016-07-10 DIAGNOSIS — L89624 Pressure ulcer of left heel, stage 4: Secondary | ICD-10-CM | POA: Diagnosis present

## 2016-07-10 DIAGNOSIS — D631 Anemia in chronic kidney disease: Secondary | ICD-10-CM | POA: Insufficient documentation

## 2016-07-10 DIAGNOSIS — E878 Other disorders of electrolyte and fluid balance, not elsewhere classified: Secondary | ICD-10-CM | POA: Diagnosis not present

## 2016-07-10 DIAGNOSIS — G40909 Epilepsy, unspecified, not intractable, without status epilepticus: Secondary | ICD-10-CM | POA: Insufficient documentation

## 2016-07-10 DIAGNOSIS — N183 Chronic kidney disease, stage 3 (moderate): Secondary | ICD-10-CM | POA: Diagnosis not present

## 2016-07-10 DIAGNOSIS — F17218 Nicotine dependence, cigarettes, with other nicotine-induced disorders: Secondary | ICD-10-CM | POA: Insufficient documentation

## 2016-07-10 DIAGNOSIS — J449 Chronic obstructive pulmonary disease, unspecified: Secondary | ICD-10-CM | POA: Insufficient documentation

## 2016-07-10 DIAGNOSIS — Z79899 Other long term (current) drug therapy: Secondary | ICD-10-CM | POA: Diagnosis not present

## 2016-07-10 DIAGNOSIS — I739 Peripheral vascular disease, unspecified: Secondary | ICD-10-CM | POA: Insufficient documentation

## 2016-07-10 DIAGNOSIS — I4891 Unspecified atrial fibrillation: Secondary | ICD-10-CM | POA: Diagnosis not present

## 2016-07-11 NOTE — Progress Notes (Signed)
JAYDIS, DUCHENE ([id number]) Visit Report for 07/10/2016 Arrival Information Details Patient Name: [name], [name] Date of Service: 07/10/2016 8:45 AM Medical Record Number: [id number] Patient Account Number: [id number] Date of Birth/Sex: 1940-08-21 (76 y.o. Male) Treating RN: Ahmed Prima Primary Care Kelvon Giannini: [name] Other Clinician: Referring Lindsie Simar: [name] Treating Jerrell Hart/Extender: Frann Rider in Treatment: 2 Visit Information History Since Last Visit All ordered tests and consults were completed: No Patient Arrived: Ambulatory Added or deleted any medications: No Arrival Time: 09:04 Any new allergies or adverse reactions: No Accompanied By: niece Had a fall or experienced change in No Transfer Assistance: None activities of daily living that may affect Patient Identification Verified: Yes risk of falls: Secondary Verification Process Yes Signs or symptoms of abuse/neglect since last No Completed: visito Patient Requires Transmission-Based No Hospitalized since last visit: No Precautions: Has Dressing in Place as Prescribed: Yes Patient Has Alerts: No Has Footwear/Offloading in Place as Yes Prescribed: Left: Wedge Shoe Pain Present Now: No Electronic Signature(s) Signed: 07/10/2016 5:04:07 PM By: Alric Quan Entered By: Alric Quan on 07/10/2016 09:05:42 [name], [name] ([id number]) -------------------------------------------------------------------------------- Encounter Discharge Information Details Patient Name: [name] Date of Service: 07/10/2016 8:45 AM Medical Record Number: [id number] Patient Account Number: [id number] Date of Birth/Sex: Jan 25, 1941 (76 y.o. Male) Treating RN: Ahmed Prima Primary Care Murry Diaz: [name] Other Clinician: Referring Uma Jerde: [name] Treating Kimblery Diop/Extender: Frann Rider in Treatment: 2 Encounter Discharge Information Items Discharge Pain Level:  0 Discharge Condition: Stable Ambulatory Status: Ambulatory Discharge Destination: Home Transportation: Private Auto Accompanied By: niece Schedule Follow-up Appointment: Yes Medication Reconciliation completed and provided to Patient/Care No Octaviano Mukai: Provided on Clinical Summary of Care: 07/10/2016 Form Type Recipient Paper Patient [facility] Electronic Signature(s) Signed: 07/10/2016 9:46:23 AM By: Ruthine Dose Entered By: Ruthine Dose on 07/10/2016 09:46:23 [name], [name]. ([id number]) -------------------------------------------------------------------------------- Lower Extremity Assessment Details Patient Name: [name] Date of Service: 07/10/2016 8:45 AM Medical Record Number: [id number] Patient Account Number: [id number] Date of Birth/Sex: Feb 19, 1941 (76 y.o. Male) Treating RN: Ahmed Prima Primary Care Luisdavid Hamblin: [name] Other Clinician: Referring Catherine Oak: [name] Treating Shawnna Pancake/Extender: Frann Rider in Treatment: 2 Vascular Assessment Pulses: Dorsalis Pedis Palpable: [Left:No] Doppler Audible: [Left:Yes] Posterior Tibial Extremity colors, hair growth, and conditions: Extremity Color: [Left:Hyperpigmented] Temperature of Extremity: [Left:Cool] Capillary Refill: [Left:< 3 seconds] Toe Nail Assessment Left: Right: Thick: No Discolored: No Deformed: No Improper Length and Hygiene: No Electronic Signature(s) Signed: 07/10/2016 5:04:07 PM By: Alric Quan Entered By: Alric Quan on 07/10/2016 09:16:52 [name], [name] ([id number]) -------------------------------------------------------------------------------- Multi Wound Chart Details Patient Name: [name] Date of Service: 07/10/2016 8:45 AM Medical Record Number: [id number] Patient Account Number: [id number] Date of Birth/Sex: 19-Dec-1940 (76 y.o. Male) Treating RN: Ahmed Prima Primary Care Seletha Zimmermann: [name] Other Clinician: Referring Dayshaun Whobrey: [name] Treating Arrick Dutton/Extender: Frann Rider in Treatment: 2 Vital Signs Height(in): Pulse(bpm): 57 Weight(lbs): 128.8 Blood Pressure 161/62 (mmHg): Body Mass Index(BMI): Temperature(F): 97.6 Respiratory Rate 18 (breaths/min): Photos: [1:No Photos] [N/A:N/A] Wound Location: [1:Left Calcaneus] [N/A:N/A] Wounding Event: [1:Pressure Injury] [N/A:N/A] Primary Etiology: [1:Pressure Ulcer] [N/A:N/A] Comorbid History: [1:Chronic Obstructive Pulmonary Disease (COPD), Arrhythmia, Hypertension, Seizure Disorder] [N/A:N/A] Date Acquired: [1:05/08/2016] [N/A:N/A] Weeks of Treatment: [1:2] [N/A:N/A] Wound Status: [1:Open] [N/A:N/A] Measurements L x W x [name] [N/A:N/A] (cm) Area (cm) : [1:5.089] [N/A:N/A] Volume (cm) : [1:1.018] [N/A:N/A] % Reduction in Area: [1:33.90%] [N/A:N/A] % Reduction in Volume: 55.90% [N/A:N/A] 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] [N/A:N/A] Exudate Amount: [1:Large] [N/A:N/A] Exudate Type: [1:Serous] [N/A:N/A] Exudate Color: [1:amber] [N/A:N/A]  Foul Odor After [1:Yes] [N/A:N/A] Cleansing: Odor Anticipated Due to No [N/A:N/A] Product Use: Wound Margin: [1:Distinct, outline attached] [N/A:N/A] Granulation Amount: [1:Large (67-100%)] [N/A:N/A] Granulation Quality: [1:Red] [N/A:N/A] Necrotic Amount: [1:Small (1-33%)] [N/A:N/A] Epithelialization: None N/A N/A Debridement: Debridement (33825- N/A N/A 11047) Pre-procedure 09:29 N/A N/A Verification/Time Out Taken: Pain Control: Lidocaine 4% Topical N/A N/A Solution Tissue Debrided: Fibrin/Slough, Exudates, N/A N/A Subcutaneous Level: Skin/Subcutaneous N/A N/A Tissue Debridement Area (sq 6.48 N/A N/A cm): Instrument: Curette N/A N/A Bleeding: Minimum N/A N/A Hemostasis Achieved: Pressure N/A N/A Procedural Pain: 0 N/A N/A Post Procedural Pain: 0 N/A N/A Debridement Treatment Procedure was tolerated N/A N/A Response: well Post Debridement 1.8x3.6x0.2 N/A N/A Measurements L x W x  [name] (cm) Post Debridement 1.018 N/A N/A Volume: (cm) Post Debridement Category/Stage III N/A N/A Stage: Periwound Skin Texture: No Abnormalities Noted N/A N/A Periwound Skin No Abnormalities Noted N/A N/A Moisture: Periwound Skin Color: No Abnormalities Noted N/A N/A Temperature: No Abnormality N/A N/A Tenderness on Yes N/A N/A Palpation: Wound Preparation: Ulcer Cleansing: N/A N/A Rinsed/Irrigated with Saline Topical Anesthetic Applied: Other: lidocaine 4% Procedures Performed: Debridement N/A N/A Treatment Notes Wound #1 (Left Calcaneus) 1. Cleansed with: Clean wound with Normal Saline 2. Anesthetic [name], [name] ([id number]) Topical Lidocaine 4% cream to wound bed prior to debridement 4. Dressing Applied: Santyl Ointment 5. Secondary Dressing Applied Dry Gauze Foam Kerlix/Conform 7. Secured with Tape Notes heel cup Electronic Signature(s) Signed: 07/10/2016 9:38:33 AM By: Christin Fudge MD, FACS Entered By: Christin Fudge on 07/10/2016 09:38:33 [name], [name] Kitchen ([id number]) -------------------------------------------------------------------------------- [place] Details Patient Name: [name], [name] Date of Service: 07/10/2016 8:45 AM Medical Record Number: [id number] Patient Account Number: [id number] Date of Birth/Sex: June 02, 1940 (76 y.o. Male) Treating RN: Ahmed Prima Primary Care Kasey Ewings: [name] Other Clinician: Referring Ravinder Lukehart: [name] Treating Shamonique Battiste/Extender: Frann Rider in Treatment: 2 Active Inactive ` Abuse / Safety / Falls / Self Care Management Nursing Diagnoses: Potential for falls Goals: Patient will remain injury free Date Initiated: 06/26/2016 Target Resolution Date: 10/11/2016 Goal Status: Active Interventions: Assess fall risk on admission and as needed Assess self care needs on admission and as needed Notes: ` Nutrition Nursing Diagnoses: Imbalanced  nutrition Goals: Patient/caregiver agrees to and verbalizes understanding of need to use nutritional supplements and/or vitamins as prescribed Date Initiated: 06/26/2016 Target Resolution Date: 10/11/2016 Goal Status: Active Interventions: Assess patient nutrition upon admission and as needed per policy Notes: ` Orientation to the Wound Care Program Nursing Diagnoses: AIKEN, WITHEM ([id number]) Knowledge deficit related to the wound healing center program Goals: Patient/caregiver will verbalize understanding of the [place] Date Initiated: 06/26/2016 Target Resolution Date: 07/12/2016 Goal Status: Active Interventions: Provide education on orientation to the wound center Notes: ` Pain, Acute or Chronic Nursing Diagnoses: Pain, acute or chronic: actual or potential Potential alteration in comfort, pain Goals: Patient/caregiver will verbalize adequate pain control between visits Date Initiated: 06/26/2016 Target Resolution Date: 10/11/2016 Goal Status: Active Interventions: Assess comfort goal upon admission Complete pain assessment as per visit requirements Notes: ` Wound/Skin Impairment Nursing Diagnoses: Impaired tissue integrity Knowledge deficit related to smoking impact on wound healing Goals: Ulcer/skin breakdown will have a volume reduction of 80% by week 12 Date Initiated: 06/26/2016 Target Resolution Date: 09/06/2016 Goal Status: Active Interventions: Assess patient/caregiver ability to perform ulcer/skin care regimen upon admission and as needed Assess ulceration(s) every visit Provide education on smoking [name], [name] ([id number]) Notes: Electronic Signature(s) Signed: 07/10/2016 5:04:07 PM By: Alric Quan Entered By:  Alric Quan on 07/10/2016 09:21:58 BRASEN, BUNDREN ([id number]) -------------------------------------------------------------------------------- Pain Assessment Details Patient Name: YUKIO, BISPING. Date of Service:  07/10/2016 8:45 AM Medical Record Number: [id number] Patient Account Number: [id number] Date of Birth/Sex: Oct 07, 1940 (76 y.o. Male) Treating RN: Ahmed Prima Primary Care Kade Rickels: [name] Other Clinician: Referring Pattijo Juste: [name] Treating Meeah Totino/Extender: Frann Rider in Treatment: 2 Active Problems Location of Pain Severity and Description of Pain Patient Has Paino No Site Locations With Dressing Change: No Pain Management and Medication Current Pain Management: Electronic Signature(s) Signed: 07/10/2016 5:04:07 PM By: Alric Quan Entered By: Alric Quan on 07/10/2016 09:05:50 [name] ([id number]) -------------------------------------------------------------------------------- Patient/Caregiver Education Details Patient Name: [name] Date of Service: 07/10/2016 8:45 AM Medical Record Number: [id number] Patient Account Number: [id number] Date of Birth/Gender: May 17, 1940 (76 y.o. Male) Treating RN: Ahmed Prima Primary Care Physician: [name] Other Clinician: Referring Physician: [name] Treating Physician/Extender: Frann Rider in Treatment: 2 Education Assessment Education Provided To: Patient and Caregiver Education Topics Provided Wound/Skin Impairment: Handouts: Other: change dressing as ordered Methods: Demonstration, Explain/Verbal Responses: State content correctly Electronic Signature(s) Signed: 07/10/2016 5:04:07 PM By: Alric Quan Entered By: Alric Quan on 07/10/2016 09:22:39 [name], Jeremy Medina [name]. ([id number]) -------------------------------------------------------------------------------- Wound Assessment Details Patient Name: [name] Date of Service: 07/10/2016 8:45 AM Medical Record Number: [id number] Patient Account Number: [id number] Date of Birth/Sex: 1941-03-28 (76 y.o. Male) Treating RN: Ahmed Prima Primary Care Jenelle Drennon: [name] Other  Clinician: Referring Patrisha Hausmann: [name] Treating Suetta Hoffmeister/Extender: Frann Rider in Treatment: 2 Wound Status Wound Number: 1 Primary Pressure Ulcer Etiology: Wound Location: Left Calcaneus Wound Open Wounding Event: Pressure Injury Status: Date Acquired: 05/08/2016 Comorbid Chronic Obstructive Pulmonary Weeks Of Treatment: 2 History: Disease (COPD), Arrhythmia, Clustered Wound: No Hypertension, Seizure Disorder Photos Photo Uploaded By: Alric Quan on 07/10/2016 16:49:32 Wound Measurements Length: (cm) 1.8 Width: (cm) 3.6 Depth: (cm) 0.2 Area: (cm) 5.089 Volume: (cm) 1.018 % Reduction in Area: 33.9% % Reduction in Volume: 55.9% Epithelialization: None Tunneling: No Undermining: No Wound Description Classification: Category/Stage III Foul Odor Aft Wound Margin: Distinct, outline attached Due to Produc Exudate Amount: Large Slough/Fibrin Exudate Type: Serous Exudate Color: amber er Cleansing: Yes t Use: No o Yes Wound Bed Granulation Amount: Large (67-100%) Granulation Quality: Red Necrotic Amount: Small (1-33%) Necrotic Quality: Adherent [address] [name] ([id number]) Periwound Skin Texture Texture Color No Abnormalities Noted: No No Abnormalities Noted: No Moisture Temperature / Pain No Abnormalities Noted: No Temperature: No Abnormality Tenderness on Palpation: Yes Wound Preparation Ulcer Cleansing: Rinsed/Irrigated with Saline Topical Anesthetic Applied: Other: lidocaine 4%, Treatment Notes Wound #1 (Left Calcaneus) 1. Cleansed with: Clean wound with Normal Saline 2. Anesthetic Topical Lidocaine 4% cream to wound bed prior to debridement 4. Dressing Applied: Santyl Ointment 5. Secondary Dressing Applied Dry Gauze Foam Kerlix/Conform 7. Secured with Tape Notes heel cup Electronic Signature(s) Signed: 07/10/2016 5:04:07 PM By: Alric Quan Entered By: Alric Quan on 07/10/2016 09:16:20 [name], [name]  ([id number]) -------------------------------------------------------------------------------- Vitals Details Patient Name: [name] Date of Service: 07/10/2016 8:45 AM Medical Record Number: [id number] Patient Account Number: [id number] Date of Birth/Sex: 1940-11-25 (76 y.o. Male) Treating RN: Ahmed Prima Primary Care Muscab Brenneman: [name] Other Clinician: Referring Tannisha Kennington: [name] Treating Laporshia Hogen/Extender: Frann Rider in Treatment: 2 Vital Signs Time Taken: 09:05 Temperature (F): 97.6 Weight (lbs): 128.8 Pulse (bpm): 57 Respiratory Rate (breaths/min): 18 Blood Pressure (mmHg): 161/62 Reference Range: 80 - 120 mg / dl Electronic Signature(s) Signed: 07/10/2016 5:04:07 PM By: Alric Quan  Entered By: Alric Quan on 07/10/2016 09:10:37

## 2016-07-11 NOTE — Progress Notes (Signed)
MILANO, ROSEVEAR ([id number]) Visit Report for 07/10/2016 Chief Complaint Document Details Patient Name: [name], [name] Date of Service: 07/10/2016 8:45 AM Medical Record Number: [id number] Patient Account Number: [id number] Date of Birth/Sex: [birth year] (76 y.o. Male) Treating RN: Ahmed Prima Primary Care Provider: [name] Other Clinician: Referring Provider: [name] Treating Provider/Extender: Frann Rider in Treatment: 2 Information Obtained from: Patient Chief Complaint Patient is at the clinic for treatment of an open pressure ulcer the left heel which she's had for about 2 months Electronic Signature(s) Signed: 07/10/2016 9:38:56 AM By: Jeremy Fudge MD, FACS Entered By: [name] on 07/10/2016 09:38:55 [name], [name] ([id number]) -------------------------------------------------------------------------------- Debridement Details Patient Name: Jeremy Gang D. Date of Service: 07/10/2016 8:45 AM Medical Record Number: [id number] Patient Account Number: [id number] Date of Birth/Sex: 19-Oct-1940 (76 y.o. Male) Treating RN: Ahmed Prima Primary Care Provider: [name] Other Clinician: Referring Provider: [name] Treating Provider/Extender: Frann Rider in Treatment: 2 Debridement Performed for Wound #1 Left Calcaneus Assessment: Performed By: Physician Jeremy Fudge, MD Debridement: Debridement Pre-procedure Yes - 09:29 Verification/Time Out Taken: Start Time: 09:30 Pain Control: Lidocaine 4% Topical Solution Level: Skin/Subcutaneous Tissue Total Area Debrided (L x 1.8 (cm) x 3.6 (cm) = 6.48 (cm) W): Tissue and other Viable, Non-Viable, Exudate, Fibrin/Slough, Subcutaneous material debrided: Instrument: Curette Bleeding: Minimum Hemostasis Achieved: Pressure End Time: 09:33 Procedural Pain: 0 Post Procedural Pain: 0 Response to Treatment: Procedure was tolerated well Post Debridement Measurements of Total  Wound Length: (cm) 1.8 Stage: Category/Stage III Width: (cm) 3.6 Depth: (cm) 0.2 Volume: (cm) 1.018 Character of Wound/Ulcer Post Requires Further Debridement: Debridement Severity of Tissue Post Fat layer exposed Debridement: Post Procedure Diagnosis Same as Pre-procedure Electronic Signature(s) Signed: 07/10/2016 9:38:49 AM By: Jeremy Fudge MD, FACS Signed: 07/10/2016 5:04:07 PM By: Alric Quan Entered By: [name] on 07/10/2016 09:38:49 [name], [name] ([id number]) [name], [name] ([id number]) -------------------------------------------------------------------------------- HPI Details Patient Name: [name], [name] Date of Service: 07/10/2016 8:45 AM Medical Record Number: [id number] Patient Account Number: [id number] Date of Birth/Sex: [birth year] (76 y.o. Male) Treating RN: Ahmed Prima Primary Care Provider: [name] Other Clinician: Referring Provider: [name] Treating Provider/Extender: Frann Rider in Treatment: 2 History of Present Illness Location: left heel ulceration Quality: Patient reports experiencing a dull pain to affected area(s). Severity: Patient states wound are getting worse. Duration: Patient has had the wound for > 2 months prior to seeking treatment at the wound center Timing: Pain in wound is Intermittent (comes and goes Context: The wound appeared gradually over time Modifying Factors: Other treatment(s) tried include:local care and offloading Associated Signs and Symptoms: Patient reports having difficulty standing for long periods. HPI Description: 76 year old gentleman who is a reformed alcoholic for about 6 months now was referred to as by his PCP team for a left heel ulceration which she's had for about 2 months. He has a history of alcohol abuse, aneurysm, hypertensive renal disease, anemia of chronic kidney disease, atrial fibrillation with RVR, COPD, hypertension, seizure disorders, tobacco abuse, cerebral  aneurysm repair in 1997 and some ice surgery. He continues to smoke about half a pack of cigarettes a day. the patient is not fully competent and has a part of attorney along with him today. 07/03/2016 -- x-ray of the left heel shows a soft tissue wound with no bony destruction or concerns for osteomyelitis. 07/10/2016 -- arterial studies are still pending. Electronic Signature(s) Signed: 07/10/2016 9:39:14 AM By: Jeremy Fudge MD, FACS Entered By: [name] on 07/10/2016 09:39:14 [name], Jeremy  D. ([id number]) -------------------------------------------------------------------------------- Physical Exam Details Patient Name: [name], [name] Date of Service: 07/10/2016 8:45 AM Medical Record Number: [id number] Patient Account Number: [id number] Date of Birth/Sex: [birth year] (76 y.o. Male) Treating RN: Ahmed Prima Primary Care Provider: [name] Other Clinician: Referring Provider: [name] Treating Provider/Extender: Frann Rider in Treatment: 2 Constitutional . Pulse regular. Respirations normal and unlabored. Afebrile. . Eyes Nonicteric. Reactive to light. Ears, Nose, Mouth, and Throat Lips, teeth, and gums WNL.Marland Kitchen Moist mucosa without lesions. Neck supple and nontender. No palpable supraclavicular or cervical adenopathy. Normal sized without goiter. Respiratory WNL. No retractions.. Breath sounds WNL, No rubs, rales, rhonchi, or wheeze.. Cardiovascular Heart rhythm and rate regular, no murmur or gallop.. Pedal Pulses WNL. No clubbing, cyanosis or edema. Chest Breasts symmetical and no nipple discharge.. Breast tissue WNL, no masses, lumps, or tenderness.. Lymphatic No adneopathy. No adenopathy. No adenopathy. Musculoskeletal Adexa without tenderness or enlargement.. Digits and nails w/o clubbing, cyanosis, infection, petechiae, ischemia, or inflammatory conditions.. Integumentary (Hair, Skin) No suspicious lesions. No crepitus or fluctuance. No  peri-wound warmth or erythema. No masses.Marland Kitchen Psychiatric Judgement and insight Intact.. No evidence of depression, anxiety, or agitation.. Notes the patient's dressings had not been changed regularly and he had a lot of maceration around the wound and extensive subcutaneous debridement was required to get down to healthy granulation tissue. Electronic Signature(s) Signed: 07/10/2016 9:39:49 AM By: Jeremy Fudge MD, FACS Entered By: [name] on 07/10/2016 09:39:48 [name] ([id number]) -------------------------------------------------------------------------------- Physician Orders Details Patient Name: Jeremy Gang D. Date of Service: 07/10/2016 8:45 AM Medical Record Number: [id number] Patient Account Number: [id number] Date of Birth/Sex: April 13, 1940 (76 y.o. Male) Treating RN: Ahmed Prima Primary Care Provider: [name] Other Clinician: Referring Provider: [name] Treating Provider/Extender: Frann Rider in Treatment: 2 Verbal / Phone Orders: Yes Clinician: Carolyne Fiscal, Debi Read Back and Verified: Yes Diagnosis Coding Wound Cleansing Wound #1 Left Calcaneus o Clean wound with Normal Saline. Anesthetic Wound #1 Left Calcaneus o Topical Lidocaine 4% cream applied to wound bed prior to debridement - clinic use Primary Wound Dressing Wound #1 Left Calcaneus o Santyl Ointment o Medihoney gel - if unable to get santyl Secondary Dressing Wound #1 Left Calcaneus o Dry Gauze o Conform/Kerlix o Other - allevyn heel cup, tape Dressing Change Frequency Wound #1 Left Calcaneus o Change dressing every day. Follow-up Appointments Wound #1 Left Calcaneus o Return Appointment in 1 week. Off-Loading Wound #1 Left Calcaneus o Turn and reposition every 2 hours o Other: - keep pressure off the heel Additional Orders / Instructions Wound #1 Left Calcaneus o Stop Smoking [place]augh, Trew D. ([id number]) o Increase protein intake. Home  Health Wound #1 Left [place] Nurse may visit PRN to address patientos wound care needs. o FACE TO FACE ENCOUNTER: MEDICARE and MEDICAID PATIENTS: I certify that this patient is under my care and that I had a face-to-face encounter that meets the physician face-to-face encounter requirements with this patient on this date. The encounter with the patient was in whole or in part for the following MEDICAL CONDITION: (primary reason for [place]) MEDICAL NECESSITY: I certify, that based on my findings, NURSING services are a medically necessary home health service. HOME BOUND STATUS: I certify that my clinical findings support that this patient is homebound (i.e., Due to illness or injury, pt requires aid of supportive devices such as crutches, cane, wheelchairs, walkers, the use of special transportation or the assistance of another  person to leave their place of residence. There is a normal inability to leave the home and doing so requires considerable and taxing effort. Other absences are for medical reasons / religious services and are infrequent or of short duration when for other reasons). o If current dressing causes regression in wound condition, may D/C ordered dressing product/s and apply Normal Saline Moist Dressing daily until next [place] / Other MD appointment. [place] of regression in wound condition at [phone]. o Please direct any NON-WOUND related issues/requests for orders to patient's Primary Care Physician Medications-please add to medication list. Wound #1 Left Calcaneus o Santyl Enzymatic Ointment o Other: - vitamin c, vitamin a, zinc, MVI Electronic Signature(s) Signed: 07/10/2016 4:30:26 PM By: Jeremy Fudge MD, FACS Signed: 07/10/2016 5:04:07 PM By: Alric Quan Entered By: Alric Quan on 07/10/2016 09:34:03 [name], [name]  ([id number]) -------------------------------------------------------------------------------- Problem List Details Patient Name: KOVEN, BELINSKY D. Date of Service: 07/10/2016 8:45 AM Medical Record Number: [id number] Patient Account Number: [id number] Date of Birth/Sex: 1940/11/14 (76 y.o. Male) Treating RN: Ahmed Prima Primary Care Provider: [name] Other Clinician: Referring Provider: [name] Treating Provider/Extender: Frann Rider in Treatment: 2 Active Problems ICD-10 Encounter Code Description Active Date Diagnosis [phone] Pressure ulcer of left heel, stage 4 06/26/2016 Yes I73.9 Peripheral vascular disease, unspecified 06/26/2016 Yes F17.218 Nicotine dependence, cigarettes, with other nicotine- 06/26/2016 Yes induced disorders Inactive Problems Resolved Problems Electronic Signature(s) Signed: 07/10/2016 9:38:28 AM By: Jeremy Fudge MD, FACS Entered By: [name] on 07/10/2016 09:38:28 [name], Jeremy Medina D. ([id number]) -------------------------------------------------------------------------------- Progress Note Details Patient Name: Jeremy Gang D. Date of Service: 07/10/2016 8:45 AM Medical Record Number: [id number] Patient Account Number: [id number] Date of Birth/Sex: 04-Jan-1941 (76 y.o. Male) Treating RN: Ahmed Prima Primary Care Provider: [name] Other Clinician: Referring Provider: [name] Treating Provider/Extender: Frann Rider in Treatment: 2 Subjective Chief Complaint Information obtained from Patient Patient is at the clinic for treatment of an open pressure ulcer the left heel which she's had for about 2 months History of Present Illness (HPI) The following HPI elements were documented for the patient's wound: Location: left heel ulceration Quality: Patient reports experiencing a dull pain to affected area(s). Severity: Patient states wound are getting worse. Duration: Patient has had the wound for > 2 months  prior to seeking treatment at the wound center Timing: Pain in wound is Intermittent (comes and goes Context: The wound appeared gradually over time Modifying Factors: Other treatment(s) tried include:local care and offloading Associated Signs and Symptoms: Patient reports having difficulty standing for long periods. 76 year old gentleman who is a reformed alcoholic for about 6 months now was referred to as by his PCP team for a left heel ulceration which she's had for about 2 months. He has a history of alcohol abuse, aneurysm, hypertensive renal disease, anemia of chronic kidney disease, atrial fibrillation with RVR, COPD, hypertension, seizure disorders, tobacco abuse, cerebral aneurysm repair in 1997 and some ice surgery. He continues to smoke about half a pack of cigarettes a day. the patient is not fully competent and has a part of attorney along with him today. 07/03/2016 -- x-ray of the left heel shows a soft tissue wound with no bony destruction or concerns for osteomyelitis. 07/10/2016 -- arterial studies are still pending. Objective Constitutional Pulse regular. Respirations normal and unlabored. Afebrile. Vitals Time Taken: 9:05 AM, Weight: 128.8 lbs, Temperature: 97.6 F, Pulse: 57 bpm, Respiratory Rate: 18 [name], [name] ([id number]) breaths/min, Blood Pressure: 161/62 mmHg. Eyes Nonicteric.  Reactive to light. Ears, Nose, Mouth, and Throat Lips, teeth, and gums WNL.Marland Kitchen Moist mucosa without lesions. Neck supple and nontender. No palpable supraclavicular or cervical adenopathy. Normal sized without goiter. Respiratory WNL. No retractions.. Breath sounds WNL, No rubs, rales, rhonchi, or wheeze.. Cardiovascular Heart rhythm and rate regular, no murmur or gallop.. Pedal Pulses WNL. No clubbing, cyanosis or edema. Chest Breasts symmetical and no nipple discharge.. Breast tissue WNL, no masses, lumps, or tenderness.. Lymphatic No adneopathy. No adenopathy. No  adenopathy. Musculoskeletal Adexa without tenderness or enlargement.. Digits and nails w/o clubbing, cyanosis, infection, petechiae, ischemia, or inflammatory conditions.Marland Kitchen Psychiatric Judgement and insight Intact.. No evidence of depression, anxiety, or agitation.. General Notes: the patient's dressings had not been changed regularly and he had a lot of maceration around the wound and extensive subcutaneous debridement was required to get down to healthy granulation tissue. Integumentary (Hair, Skin) No suspicious lesions. No crepitus or fluctuance. No peri-wound warmth or erythema. No masses.. Wound #1 status is Open. Original cause of wound was Pressure Injury. The wound is located on the Left Calcaneus. The wound measures 1.8cm length x 3.6cm width x 0.2cm depth; 5.089cm^2 area and 1.018cm^3 volume. There is no tunneling or undermining noted. There is a large amount of serous drainage noted. The wound margin is distinct with the outline attached to the wound base. There is large (67-100%) red granulation within the wound bed. There is a small (1-33%) amount of necrotic tissue within the wound bed including Adherent Slough. Periwound temperature was noted as No Abnormality. The periwound has tenderness on palpation. Assessment [name], [name] ([id number]) Active Problems ICD-10 [phone] - Pressure ulcer of left heel, 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 - Peripheral vascular disease, unspecified F17.218 - Nicotine dependence, cigarettes, with other nicotine-induced disorders Procedures Wound #1 Wound #1 is a Pressure Ulcer located on the Left Calcaneus . There was a Skin/Subcutaneous Tissue Debridement ([id number]) debridement with total area of 6.48 sq cm performed by Jeremy Fudge, MD. with the following instrument(s): Curette to remove Viable and Non-Viable tissue/material including Exudate, Fibrin/Slough, and Subcutaneous after achieving pain control using Lidocaine 4% Topical Solution. A time out was  conducted at 09:29, prior to the start of the procedure. A Minimum amount of bleeding was controlled with Pressure. The procedure was tolerated well with a pain level of 0 throughout and a pain level of 0 following the procedure. Post Debridement Measurements: 1.8cm length x 3.6cm width x 0.2cm depth; 1.018cm^3 volume. Post debridement Stage noted as Category/Stage III. Character of Wound/Ulcer Post Debridement requires further debridement. Severity of Tissue Post Debridement is: Fat layer exposed. Post procedure Diagnosis Wound #1: Same as Pre-Procedure Plan Wound Cleansing: Wound #1 Left Calcaneus: Clean wound with Normal Saline. Anesthetic: Wound #1 Left Calcaneus: Topical Lidocaine 4% cream applied to wound bed prior to debridement - clinic use Primary Wound Dressing: Wound #1 Left Calcaneus: Santyl Ointment Medihoney gel - if unable to get santyl Secondary Dressing: Wound #1 Left Calcaneus: Dry Gauze Conform/Kerlix Other - allevyn heel cup, tape [name], [name] ([id number]) Dressing Change Frequency: Wound #1 Left Calcaneus: Change dressing every day. Follow-up Appointments: Wound #1 Left Calcaneus: Return Appointment in 1 week. Off-Loading: Wound #1 Left Calcaneus: Turn and reposition every 2 hours Other: - keep pressure off the heel Additional Orders / Instructions: Wound #1 Left Calcaneus: Stop Smoking Increase protein intake. Home Health: Wound #1 Left Calcaneus: Continue Home Health Visits - [facility] Nurse may visit PRN to address patient s wound care needs. FACE TO FACE ENCOUNTER:  MEDICARE and MEDICAID PATIENTS: I certify that this patient is under my care and that I had a face-to-face encounter that meets the physician face-to-face encounter requirements with this patient on this date. The encounter with the patient was in whole or in part for the following MEDICAL CONDITION: (primary reason for [place]) MEDICAL NECESSITY: I certify, that  based on my findings, NURSING services are a medically necessary home health service. HOME BOUND STATUS: I certify that my clinical findings support that this patient is homebound (i.e., Due to illness or injury, pt requires aid of supportive devices such as crutches, cane, wheelchairs, walkers, the use of special transportation or the assistance of another person to leave their place of residence. There is a normal inability to leave the home and doing so requires considerable and taxing effort. Other absences are for medical reasons / religious services and are infrequent or of short duration when for other reasons). If current dressing causes regression in wound condition, may D/C ordered dressing product/s and apply Normal Saline Moist Dressing daily until next [place] / Other MD appointment. [place] of regression in wound condition at [phone]. Please direct any NON-WOUND related issues/requests for orders to patient's Primary Care Physician Medications-please add to medication list.: Wound #1 Left Calcaneus: Santyl Enzymatic Ointment Other: - vitamin c, vitamin a, zinc, MVI After review I have recommended: 1. Santyl ointment to be applied daily and covered with a heel cup 2. Offloading has been discussed in great detail 3. A posterior offloading shoe has been provided 4. Arterial duplex study of the left lower extremity -- appointment still pending 5. regular visits the wound center [name], [place]. ([id number]) The power of attorney has been very compliant and all questions have been answered. Electronic Signature(s) Signed: 07/10/2016 9:40:31 AM By: Jeremy Fudge MD, FACS Entered By: [name] on 07/10/2016 09:40:30 JAHZIAH, SIMONIN D. ([id number]) -------------------------------------------------------------------------------- SuperBill Details Patient Name: Jeremy Gang D. Date of Service: 07/10/2016 Medical Record Number: [id number] Patient Account  Number: [id number] Date of Birth/Sex: 1940/12/16 (76 y.o. Male) Treating RN: Ahmed Prima Primary Care Provider: [name] Other Clinician: Referring Provider: [name] Treating Provider/Extender: Frann Rider in Treatment: 2 Diagnosis Coding ICD-10 Codes Code Description [id number] Pressure ulcer of left heel, 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 Peripheral vascular disease, unspecified F17.218 Nicotine dependence, cigarettes, with other nicotine-induced disorders Facility Procedures CPT4 Code Description: [id number] 11042 - DEB SUBQ TISSUE 20 SQ CM/< ICD-10 Description Diagnosis 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, 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 Peripheral vascular disease, unspecified F17.218 Nicotine dependence, cigarettes, with other nicoti Modifier: ne-induced di Quantity: 1 sorders Physician Procedures CPT4 Code Description: [id number] - WC PHYS SUBQ TISS 20 SQ CM ICD-10 Description Diagnosis 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, 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 Peripheral vascular disease, unspecified F17.218 Nicotine dependence, cigarettes, with other nicoti Modifier: ne-induced di Quantity: 1 sorders Electronic Signature(s) Signed: 07/10/2016 9:41:04 AM By: Jeremy Fudge MD, FACS Entered By: [name] on 07/10/2016 09:41:04

## 2016-07-15 ENCOUNTER — Telehealth: Payer: Self-pay | Admitting: *Deleted

## 2016-07-15 NOTE — Telephone Encounter (Signed)
Patient caregiver called and stated that patient was seen last week and you wanted to know one of the pills he was taking and it is [facility] Vitamin [facility].

## 2016-07-15 NOTE — Telephone Encounter (Signed)
okay thanks

## 2016-07-17 ENCOUNTER — Ambulatory Visit: Payer: Medicare Other | Admitting: Nurse Practitioner

## 2016-07-17 ENCOUNTER — Encounter: Payer: Medicare Other | Admitting: Surgery

## 2016-07-17 DIAGNOSIS — L89624 Pressure ulcer of left heel, stage 4: Secondary | ICD-10-CM | POA: Diagnosis not present

## 2016-07-19 NOTE — Progress Notes (Signed)
ZAMERE, PASTERNAK ([id number]) Visit Report for 07/17/2016 Arrival Information Details Patient Name: [name], [name] Date of Service: 07/17/2016 1:30 PM Medical Record Number: [id number] Patient Account Number: [id number] Date of Birth/Sex: December 17, 1940 (76 y.o. Male) Treating RN: Ahmed Prima Primary Care [name]: [name] Other Clinician: Referring Hondo Nanda: [name] Treating Rhiannon Sassaman/Extender: Frann Rider in Treatment: 3 Visit Information History Since Last Visit All ordered tests and consults were completed: No Patient Arrived: Ambulatory Added or deleted any medications: No Arrival Time: 13:09 Any new allergies or adverse reactions: No Accompanied By: caregiver Had a fall or experienced change in No Transfer Assistance: None activities of daily living that may affect Patient Identification Verified: Yes risk of falls: Secondary Verification Process Yes Signs or symptoms of abuse/neglect since last No Completed: visito Patient Requires Transmission-Based No Hospitalized since last visit: No Precautions: Has Dressing in Place as Prescribed: Yes Patient Has Alerts: No Pain Present Now: No Electronic Signature(s) Signed: 07/17/2016 4:33:22 PM By: Alric Quan Entered By: Alric Quan on 07/17/2016 13:13:14 Shrum, [name] ([id number]) -------------------------------------------------------------------------------- Encounter Discharge Information Details Patient Name: Jeremy Gang D. Date of Service: 07/17/2016 1:30 PM Medical Record Number: [id number] Patient Account Number: [id number] Date of Birth/Sex: Aug 02, 1940 (76 y.o. Male) Treating RN: Ahmed Prima Primary Care Gabrella Stroh: [name] Other Clinician: Referring Rachyl Wuebker: [name] Treating Kaydence Baba/Extender: Frann Rider in Treatment: 3 Encounter Discharge Information Items Schedule Follow-up Appointment: No Medication Reconciliation completed No and provided to  Patient/Care Vernice Mannina: Provided on Clinical Summary of Care: 07/17/2016 Form Type Recipient Paper Patient [facility] Electronic Signature(s) Signed: 07/17/2016 1:38:52 PM By: Ruthine Dose Entered By: Ruthine Dose on 07/17/2016 13:38:51 Dolata, [name] ([id number]) -------------------------------------------------------------------------------- Lower Extremity Assessment Details Patient Name: Jeremy Gang D. Date of Service: 07/17/2016 1:30 PM Medical Record Number: [id number] Patient Account Number: [id number] Date of Birth/Sex: 1940/06/06 (76 y.o. Male) Treating RN: Ahmed Prima Primary Care Christinamarie Tall: [name] Other Clinician: Referring Shandora Koogler: [name] Treating Saira Kramme/Extender: Frann Rider in Treatment: 3 Vascular Assessment Pulses: Dorsalis Pedis Palpable: [Left:No] Doppler Audible: [Left:Yes] Posterior Tibial Extremity colors, hair growth, and conditions: Extremity Color: [Left:Hyperpigmented] Temperature of Extremity: [Left:Cool] Capillary Refill: [Left:< 3 seconds] Toe Nail Assessment Left: Right: Thick: No Discolored: No Deformed: No Improper Length and Hygiene: No Electronic Signature(s) Signed: 07/17/2016 4:33:22 PM By: Alric Quan Entered By: Alric Quan on 07/17/2016 13:21:07 Steyer, [name] ([id number]) -------------------------------------------------------------------------------- Multi Wound Chart Details Patient Name: Jeremy Gang D. Date of Service: 07/17/2016 1:30 PM Medical Record Number: [id number] Patient Account Number: [id number] Date of Birth/Sex: 1941-01-29 (76 y.o. Male) Treating RN: Ahmed Prima Primary Care Kateryna Grantham: [name] Other Clinician: Referring Grazia Taffe: [name] Treating Austynn Pridmore/Extender: Frann Rider in Treatment: 3 Vital Signs Height(in): Pulse(bpm): 53 Weight(lbs): 128.8 Blood Pressure 176/61 (mmHg): Body Mass Index(BMI): Temperature(F): 98.1 Respiratory  Rate 18 (breaths/min): Photos: [1:No Photos] [N/A:N/A] Wound Location: [1:Left Calcaneus] [N/A:N/A] Wounding Event: [1:Pressure Injury] [N/A:N/A] Primary Etiology: [1:Pressure Ulcer] [N/A:N/A] Comorbid History: [1:Chronic Obstructive Pulmonary Disease (COPD), Arrhythmia, Hypertension, Seizure Disorder] [N/A:N/A] Date Acquired: [1:05/08/2016] [N/A:N/A] Weeks of Treatment: [1:3] [N/A:N/A] Wound Status: [1:Open] [N/A:N/A] Measurements L x W x D 1.2x2.4x0.2 [N/A:N/A] (cm) Area (cm) : [1:2.262] [N/A:N/A] Volume (cm) : [1:0.452] [N/A:N/A] % Reduction in Area: [1:70.60%] [N/A:N/A] % Reduction in Volume: 80.40% [N/A:N/A] 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] [N/A:N/A] Exudate Amount: [1:Large] [N/A:N/A] Exudate Type: [1:Serous] [N/A:N/A] Exudate Color: [1:amber] [N/A:N/A] Foul Odor After [1:Yes] [N/A:N/A] Cleansing: Odor Anticipated Due to No [N/A:N/A] Product Use: Wound Margin: [1:Distinct, outline attached] [N/A:N/A] Granulation Amount: [1:Large (67-100%)] [N/A:N/A] Granulation Quality: [1:Red] [N/A:N/A]  Necrotic Amount: [1:Small (1-33%)] [N/A:N/A] Epithelialization: None N/A N/A Debridement: Debridement ([id number]- N/A N/A [id number]) Pre-procedure 13:25 N/A N/A Verification/Time Out Taken: Pain Control: Lidocaine 4% Topical N/A N/A Solution Tissue Debrided: Fibrin/Slough, Exudates, N/A N/A Subcutaneous Level: Skin/Subcutaneous N/A N/A Tissue Debridement Area (sq 2.88 N/A N/A cm): Instrument: Curette N/A N/A Bleeding: Minimum N/A N/A Hemostasis Achieved: Pressure N/A N/A Procedural Pain: 0 N/A N/A Post Procedural Pain: 0 N/A N/A Debridement Treatment Procedure was tolerated N/A N/A Response: well Post Debridement 1.2x2.4x0.5 N/A N/A Measurements L x W x D (cm) Post Debridement 1.131 N/A N/A Volume: (cm) Post Debridement Category/Stage III N/A N/A Stage: Periwound Skin Texture: No Abnormalities Noted N/A N/A Periwound Skin No Abnormalities Noted N/A  N/A Moisture: Periwound Skin Color: No Abnormalities Noted N/A N/A Temperature: No Abnormality N/A N/A Tenderness on Yes N/A N/A Palpation: Wound Preparation: Ulcer Cleansing: N/A N/A Rinsed/Irrigated with Saline Topical Anesthetic Applied: Other: lidocaine 4% Procedures Performed: Debridement N/A N/A Treatment Notes Electronic Signature(s) Signed: 07/17/2016 2:04:40 PM By: Christin Fudge MD, FACS [name], [name] ([id number]) Entered By: Christin Fudge on 07/17/2016 14:04:40 [name], [name] ([id number]) -------------------------------------------------------------------------------- [place] Details Patient Name: [name], [name] Date of Service: 07/17/2016 1:30 PM Medical Record Number: [id number] Patient Account Number: [id number] Date of Birth/Sex: Jul 27, 1940 (76 y.o. Male) Treating RN: Ahmed Prima Primary Care Delance Weide: [name] Other Clinician: Referring Azari Janssens: [name] Treating Gonsalo Cuthbertson/Extender: Frann Rider in Treatment: 3 Active Inactive ` Abuse / Safety / Falls / Self Care Management Nursing Diagnoses: Potential for falls Goals: Patient will remain injury free Date Initiated: 06/26/2016 Target Resolution Date: 10/11/2016 Goal Status: Active Interventions: Assess fall risk on admission and as needed Assess self care needs on admission and as needed Notes: ` Nutrition Nursing Diagnoses: Imbalanced nutrition Goals: Patient/caregiver agrees to and verbalizes understanding of need to use nutritional supplements and/or vitamins as prescribed Date Initiated: 06/26/2016 Target Resolution Date: 10/11/2016 Goal Status: Active Interventions: Assess patient nutrition upon admission and as needed per policy Notes: ` Orientation to the Wound Care Program Nursing Diagnoses: [name], [name] ([id number]) Knowledge deficit related to the wound healing center program Goals: Patient/caregiver will verbalize understanding of the  [place] Date Initiated: 06/26/2016 Target Resolution Date: 07/12/2016 Goal Status: Active Interventions: Provide education on orientation to the wound center Notes: ` Pain, Acute or Chronic Nursing Diagnoses: Pain, acute or chronic: actual or potential Potential alteration in comfort, pain Goals: Patient/caregiver will verbalize adequate pain control between visits Date Initiated: 06/26/2016 Target Resolution Date: 10/11/2016 Goal Status: Active Interventions: Assess comfort goal upon admission Complete pain assessment as per visit requirements Notes: ` Wound/Skin Impairment Nursing Diagnoses: Impaired tissue integrity Knowledge deficit related to smoking impact on wound healing Goals: Ulcer/skin breakdown will have a volume reduction of 80% by week 12 Date Initiated: 06/26/2016 Target Resolution Date: 09/06/2016 Goal Status: Active Interventions: Assess patient/caregiver ability to perform ulcer/skin care regimen upon admission and as needed Assess ulceration(s) every visit Provide education on smoking [name], [name] ([id number]) Notes: Electronic Signature(s) Signed: 07/17/2016 4:33:22 PM By: Alric Quan Entered By: Alric Quan on 07/17/2016 13:21:26 [name], [name] ([id number]) -------------------------------------------------------------------------------- Pain Assessment Details Patient Name: Jeremy Gang D. Date of Service: 07/17/2016 1:30 PM Medical Record Number: [id number] Patient Account Number: [id number] Date of Birth/Sex: [birth year] (76 y.o. Male) Treating RN: Ahmed Prima Primary Care Kieffer Blatz: [name] Other Clinician: Referring Tasheka Houseman: [name] Treating Tsuruko Murtha/Extender: Frann Rider in Treatment: 3 Active Problems Location of Pain Severity and Description of Pain Patient  Has Paino No Site Locations With Dressing Change: No Pain Management and Medication Current Pain Management: Electronic  Signature(s) Signed: 07/17/2016 4:33:22 PM By: Alric Quan Entered By: Alric Quan on 07/17/2016 13:13:20 [name], [name] ([id number]) -------------------------------------------------------------------------------- Wound Assessment Details Patient Name: Jeremy Gang D. Date of Service: 07/17/2016 1:30 PM Medical Record Number: [id number] Patient Account Number: [id number] Date of Birth/Sex: Jul 30, 1940 (76 y.o. Male) Treating RN: Ahmed Prima Primary Care Jordani Nunn: [name] Other Clinician: Referring Elvi Leventhal: [name] Treating [name]: Frann Rider in Treatment: 3 Wound Status Wound Number: 1 Primary Pressure Ulcer Etiology: Wound Location: Left Calcaneus Wound Open Wounding Event: Pressure Injury Status: Date Acquired: 05/08/2016 Comorbid Chronic Obstructive Pulmonary Weeks Of Treatment: 3 History: Disease (COPD), Arrhythmia, Clustered Wound: No Hypertension, Seizure Disorder Photos Photo Uploaded By: Alric Quan on 07/17/2016 16:03:20 Wound Measurements Length: (cm) 1.2 Width: (cm) 2.4 Depth: (cm) 0.2 Area: (cm) 2.262 Volume: (cm) 0.452 % Reduction in Area: 70.6% % Reduction in Volume: 80.4% Epithelialization: None Tunneling: No Undermining: No Wound Description Classification: Category/Stage III Foul Odor Aft Wound Margin: Distinct, outline attached Due to Produc Exudate Amount: Large Slough/Fibrin Exudate Type: Serous Exudate Color: amber er Cleansing: Yes t Use: No o Yes Wound Bed Granulation Amount: Large (67-100%) Granulation Quality: Red Necrotic Amount: Small (1-33%) Necrotic Quality: Adherent [address] [place] ([id number]) Periwound Skin Texture Texture Color No Abnormalities Noted: No No Abnormalities Noted: No Moisture Temperature / Pain No Abnormalities Noted: No Temperature: No Abnormality Tenderness on Palpation: Yes Wound Preparation Ulcer Cleansing: Rinsed/Irrigated with  Saline Topical Anesthetic Applied: Other: lidocaine 4%, Electronic Signature(s) Signed: 07/17/2016 4:33:22 PM By: Alric Quan Entered By: Alric Quan on 07/17/2016 13:20:09 ZYDEN, SUMAN D. ([id number]) -------------------------------------------------------------------------------- Vitals Details Patient Name: Jeremy Gang D. Date of Service: 07/17/2016 1:30 PM Medical Record Number: [id number] Patient Account Number: [id number] Date of Birth/Sex: 1940-08-08 (76 y.o. Male) Treating RN: Ahmed Prima Primary Care Ademide Schaberg: [name] Other Clinician: Referring Alecxis Baltzell: [name] Treating Ilan Kahrs/Extender: Frann Rider in Treatment: 3 Vital Signs Time Taken: 13:13 Temperature (F): 98.1 Weight (lbs): 128.8 Pulse (bpm): 53 Respiratory Rate (breaths/min): 18 Blood Pressure (mmHg): 176/61 Reference Range: 80 - 120 mg / dl Electronic Signature(s) Signed: 07/17/2016 4:33:22 PM By: Alric Quan Entered By: Alric Quan on 07/17/2016 13:15:29

## 2016-07-19 NOTE — Progress Notes (Signed)
JAYANTH, SZCZESNIAK ([id number]) Visit Report for 07/17/2016 Chief Complaint Document Details Patient Name: Jeremy, [name]. Date of Service: 07/17/2016 1:30 PM Medical Record Number: [id number] Patient Account Number: [id number] Date of Birth/Sex: April 26, 1940 (76 y.o. Male) Treating RN: Ahmed Prima Primary Care Provider: [name] Other Clinician: Referring Provider: [name] Treating Provider/Extender: Frann Rider in Treatment: 3 Information Obtained from: Patient Chief Complaint Patient is at the clinic for treatment of an open pressure ulcer the left heel which she's had for about 2 months Electronic Signature(s) Signed: 07/17/2016 2:04:54 PM By: Christin Fudge MD, FACS Entered By: Christin Fudge on 07/17/2016 14:04:54 Stults, Vahe D. ([id number]) -------------------------------------------------------------------------------- Debridement Details Patient Name: Jeremy Gang D. Date of Service: 07/17/2016 1:30 PM Medical Record Number: [id number] Patient Account Number: [id number] Date of Birth/Sex: [birth year] (76 y.o. Male) Treating RN: Ahmed Prima Primary Care Provider: [name] Other Clinician: Referring Provider: [name] Treating Provider/Extender: Frann Rider in Treatment: 3 Debridement Performed for Wound #1 Left Calcaneus Assessment: Performed By: Physician Christin Fudge, MD Debridement: Debridement Pre-procedure Yes - 13:25 Verification/Time Out Taken: Start Time: 13:26 Pain Control: Lidocaine 4% Topical Solution Level: Skin/Subcutaneous Tissue Total Area Debrided (L x 1.2 (cm) x 2.4 (cm) = 2.88 (cm) W): Tissue and other Viable, Non-Viable, Exudate, Fibrin/Slough, Subcutaneous material debrided: Instrument: Curette Bleeding: Minimum Hemostasis Achieved: Pressure End Time: 13:28 Procedural Pain: 0 Post Procedural Pain: 0 Response to Treatment: Procedure was tolerated well Post Debridement Measurements of Total  Wound Length: (cm) 1.2 Stage: Category/Stage III Width: (cm) 2.4 Depth: (cm) 0.5 Volume: (cm) 1.131 Character of Wound/Ulcer Post Requires Further Debridement: Debridement Severity of Tissue Post Fat layer exposed Debridement: Post Procedure Diagnosis Same as Pre-procedure Electronic Signature(s) Signed: 07/17/2016 2:04:47 PM By: Christin Fudge MD, FACS Signed: 07/17/2016 4:33:22 PM By: Alric Quan Entered By: Christin Fudge on 07/17/2016 14:04:46 [name], [name] ([id number]) [name], [name] ([id number]) -------------------------------------------------------------------------------- HPI Details Patient Name: Jeremy, BERTZ D. Date of Service: 07/17/2016 1:30 PM Medical Record Number: [id number] Patient Account Number: [id number] Date of Birth/Sex: 01-Aug-1940 (76 y.o. Male) Treating RN: Ahmed Prima Primary Care Provider: [name] Other Clinician: Referring Provider: [name] Treating Provider/Extender: Frann Rider in Treatment: 3 History of Present Illness Location: left heel ulceration Quality: Patient reports experiencing a dull pain to affected area(s). Severity: Patient states wound are getting worse. Duration: Patient has had the wound for > 2 months prior to seeking treatment at the wound center Timing: Pain in wound is Intermittent (comes and goes Context: The wound appeared gradually over time Modifying Factors: Other treatment(s) tried include:local care and offloading Associated Signs and Symptoms: Patient reports having difficulty standing for long periods. HPI Description: 76 year old gentleman who is a reformed alcoholic for about 6 months now was referred to as by his PCP team for a left heel ulceration which she's had for about 2 months. He has a history of alcohol abuse, aneurysm, hypertensive renal disease, anemia of chronic kidney disease, atrial fibrillation with RVR, COPD, hypertension, seizure disorders, tobacco abuse, cerebral  aneurysm repair in 1997 and some ice surgery. He continues to smoke about half a pack of cigarettes a day. the patient is not fully competent and has a part of attorney along with him today. 07/03/2016 -- x-ray of the left heel shows a soft tissue wound with no bony destruction or concerns for osteomyelitis. 07/10/2016 -- arterial studies are still pending. Electronic Signature(s) Signed: 07/17/2016 2:04:59 PM By: Christin Fudge MD, FACS Entered By: Christin Fudge on 07/17/2016 14:04:58 [name], [name]  D. ([id number]) -------------------------------------------------------------------------------- Physical Exam Details Patient Name: Jeremy, [name]. Date of Service: 07/17/2016 1:30 PM Medical Record Number: [id number] Patient Account Number: [id number] Date of Birth/Sex: 02/12/1941 (76 y.o. Male) Treating RN: Ahmed Prima Primary Care Provider: [name] Other Clinician: Referring Provider: [name] Treating Provider/Extender: Frann Rider in Treatment: 3 Constitutional . Pulse regular. Respirations normal and unlabored. Afebrile. . Eyes Nonicteric. Reactive to light. Ears, Nose, Mouth, and Throat Lips, teeth, and gums WNL.Marland Kitchen Moist mucosa without lesions. Neck supple and nontender. No palpable supraclavicular or cervical adenopathy. Normal sized without goiter. Respiratory WNL. No retractions.. Cardiovascular Pedal Pulses WNL. No clubbing, cyanosis or edema. Lymphatic No adneopathy. No adenopathy. No adenopathy. Musculoskeletal Adexa without tenderness or enlargement.. Digits and nails w/o clubbing, cyanosis, infection, petechiae, ischemia, or inflammatory conditions.. Integumentary (Hair, Skin) No suspicious lesions. No crepitus or fluctuance. No peri-wound warmth or erythema. No masses.Marland Kitchen Psychiatric Judgement and insight Intact.. No evidence of depression, anxiety, or agitation.. Notes the wound looks much better as far as the maceration goes but he has  significant amount of subcutaneous urine is debris which was sharply removed with a #3 curet and under this there is healthy granulation tissue. Minimal bleeding controlled with pressure. Electronic Signature(s) Signed: 07/17/2016 2:05:36 PM By: Christin Fudge MD, FACS Entered By: Christin Fudge on 07/17/2016 14:05:35 Jeremy, [name] ([id number]) -------------------------------------------------------------------------------- Physician Orders Details Patient Name: Jeremy Gang D. Date of Service: 07/17/2016 1:30 PM Medical Record Number: [id number] Patient Account Number: [id number] Date of Birth/Sex: 05-Mar-1941 (76 y.o. Male) Treating RN: Ahmed Prima Primary Care Provider: [name] Other Clinician: Referring Provider: [name] Treating Provider/Extender: Frann Rider in Treatment: 3 Verbal / Phone Orders: Yes Clinician: Carolyne Fiscal, Debi Read Back and Verified: Yes Diagnosis Coding Wound Cleansing Wound #1 Left Calcaneus o Clean wound with Normal Saline. Anesthetic Wound #1 Left Calcaneus o Topical Lidocaine 4% cream applied to wound bed prior to debridement - clinic use Primary Wound Dressing Wound #1 Left Calcaneus o Santyl Ointment Secondary Dressing Wound #1 Left Calcaneus o Dry Gauze o Conform/Kerlix o Other - allevyn heel cup, tape Dressing Change Frequency Wound #1 Left Calcaneus o Change dressing every day. Follow-up Appointments Wound #1 Left Calcaneus o Return Appointment in 1 week. Off-Loading Wound #1 Left Calcaneus o Turn and reposition every 2 hours o Other: - keep pressure off the heel Additional Orders / Instructions Wound #1 Left Calcaneus o Stop Smoking o Increase protein intake. Jeremy, [name] ([id number]) Home Health Wound #1 Left [place] Nurse may visit PRN to address patientos wound care needs. o FACE TO FACE ENCOUNTER: MEDICARE and  MEDICAID PATIENTS: I certify that this patient is under my care and that I had a face-to-face encounter that meets the physician face-to-face encounter requirements with this patient on this date. The encounter with the patient was in whole or in part for the following MEDICAL CONDITION: (primary reason for [place]) MEDICAL NECESSITY: I certify, that based on my findings, NURSING services are a medically necessary home health service. HOME BOUND STATUS: I certify that my clinical findings support that this patient is homebound (i.e., Due to illness or injury, pt requires aid of supportive devices such as crutches, cane, wheelchairs, walkers, the use of special transportation or the assistance of another person to leave their place of residence. There is a normal inability to leave the home and doing so requires considerable and taxing effort. Other absences are for medical reasons /  religious services and are infrequent or of short duration when for other reasons). o If current dressing causes regression in wound condition, may D/C ordered dressing product/s and apply Normal Saline Moist Dressing daily until next [place] / Other MD appointment. [place] of regression in wound condition at [phone]. o Please direct any NON-WOUND related issues/requests for orders to patient's Primary Care Physician Medications-please add to medication list. Wound #1 Left Calcaneus o Santyl Enzymatic Ointment o Other: - vitamin c, vitamin a, zinc, MVI Electronic Signature(s) Signed: 07/17/2016 2:17:24 PM By: Christin Fudge MD, FACS Signed: 07/17/2016 4:33:22 PM By: Alric Quan Entered By: Alric Quan on 07/17/2016 13:29:47 [name], [name] ([id number]) -------------------------------------------------------------------------------- Problem List Details Patient Name: ZAN, ORLICK D. Date of Service: 07/17/2016 1:30 PM Medical Record Number:  [id number] Patient Account Number: [id number] Date of Birth/Sex: July 16, 1940 (76 y.o. Male) Treating RN: Ahmed Prima Primary Care Provider: [name] Other Clinician: Referring Provider: [name] Treating Provider/Extender: Frann Rider in Treatment: 3 Active Problems ICD-10 Encounter Code Description Active Date Diagnosis [phone] Pressure ulcer of left heel, stage 4 06/26/2016 Yes I73.9 Peripheral vascular disease, unspecified 06/26/2016 Yes F17.218 Nicotine dependence, cigarettes, with other nicotine- 06/26/2016 Yes induced disorders Inactive Problems Resolved Problems Electronic Signature(s) Signed: 07/17/2016 2:04:36 PM By: Christin Fudge MD, FACS Entered By: Christin Fudge on 07/17/2016 14:04:36 Ludtke, Tao D. ([id number]) -------------------------------------------------------------------------------- Progress Note Details Patient Name: Jeremy Gang D. Date of Service: 07/17/2016 1:30 PM Medical Record Number: [id number] Patient Account Number: [id number] Date of Birth/Sex: February 09, 1941 (76 y.o. Male) Treating RN: Ahmed Prima Primary Care Provider: [name] Other Clinician: Referring Provider: [name] Treating Provider/Extender: Frann Rider in Treatment: 3 Subjective Chief Complaint Information obtained from Patient Patient is at the clinic for treatment of an open pressure ulcer the left heel which she's had for about 2 months History of Present Illness (HPI) The following HPI elements were documented for the patient's wound: Location: left heel ulceration Quality: Patient reports experiencing a dull pain to affected area(s). Severity: Patient states wound are getting worse. Duration: Patient has had the wound for > 2 months prior to seeking treatment at the wound center Timing: Pain in wound is Intermittent (comes and goes Context: The wound appeared gradually over time Modifying Factors: Other treatment(s) tried  include:local care and offloading Associated Signs and Symptoms: Patient reports having difficulty standing for long periods. 76 year old gentleman who is a reformed alcoholic for about 6 months now was referred to as by his PCP team for a left heel ulceration which she's had for about 2 months. He has a history of alcohol abuse, aneurysm, hypertensive renal disease, anemia of chronic kidney disease, atrial fibrillation with RVR, COPD, hypertension, seizure disorders, tobacco abuse, cerebral aneurysm repair in 1997 and some ice surgery. He continues to smoke about half a pack of cigarettes a day. the patient is not fully competent and has a part of attorney along with him today. 07/03/2016 -- x-ray of the left heel shows a soft tissue wound with no bony destruction or concerns for osteomyelitis. 07/10/2016 -- arterial studies are still pending. Objective Constitutional Pulse regular. Respirations normal and unlabored. Afebrile. Vitals Time Taken: 1:13 PM, Weight: 128.8 lbs, Temperature: 98.1 F, Pulse: 53 bpm, Respiratory Rate: 18 [name], [name] ([id number]) breaths/min, Blood Pressure: 176/61 mmHg. Eyes Nonicteric. Reactive to light. Ears, Nose, Mouth, and Throat Lips, teeth, and gums WNL.Marland Kitchen Moist mucosa without lesions. Neck supple and nontender. No palpable supraclavicular or cervical adenopathy. Normal sized without goiter.  Respiratory WNL. No retractions.. Cardiovascular Pedal Pulses WNL. No clubbing, cyanosis or edema. Lymphatic No adneopathy. No adenopathy. No adenopathy. Musculoskeletal Adexa without tenderness or enlargement.. Digits and nails w/o clubbing, cyanosis, infection, petechiae, ischemia, or inflammatory conditions.Marland Kitchen Psychiatric Judgement and insight Intact.. No evidence of depression, anxiety, or agitation.. General Notes: the wound looks much better as far as the maceration goes but he has significant amount of subcutaneous urine is debris which was sharply  removed with a #3 curet and under this there is healthy granulation tissue. Minimal bleeding controlled with pressure. Integumentary (Hair, Skin) No suspicious lesions. No crepitus or fluctuance. No peri-wound warmth or erythema. No masses.. Wound #1 status is Open. Original cause of wound was Pressure Injury. The wound is located on the Left Calcaneus. The wound measures 1.2cm length x 2.4cm width x 0.2cm depth; 2.262cm^2 area and 0.452cm^3 volume. There is no tunneling or undermining noted. There is a large amount of serous drainage noted. The wound margin is distinct with the outline attached to the wound base. There is large (67-100%) red granulation within the wound bed. There is a small (1-33%) amount of necrotic tissue within the wound bed including Adherent Slough. Periwound temperature was noted as No Abnormality. The periwound has tenderness on palpation. Assessment Jeremy, [name] ([id number]) Active Problems ICD-10 [phone] - Pressure ulcer of left heel, 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 - Peripheral vascular disease, unspecified F17.218 - Nicotine dependence, cigarettes, with other nicotine-induced disorders Procedures Wound #1 Wound #1 is a Pressure Ulcer located on the Left Calcaneus . There was a Skin/Subcutaneous Tissue Debridement ([id number]) debridement with total area of 2.88 sq cm performed by Christin Fudge, MD. with the following instrument(s): Curette to remove Viable and Non-Viable tissue/material including Exudate, Fibrin/Slough, and Subcutaneous after achieving pain control using Lidocaine 4% Topical Solution. A time out was conducted at 13:25, prior to the start of the procedure. A Minimum amount of bleeding was controlled with Pressure. The procedure was tolerated well with a pain level of 0 throughout and a pain level of 0 following the procedure. Post Debridement Measurements: 1.2cm length x 2.4cm width x 0.5cm depth; 1.131cm^3 volume. Post debridement Stage noted as  Category/Stage III. Character of Wound/Ulcer Post Debridement requires further debridement. Severity of Tissue Post Debridement is: Fat layer exposed. Post procedure Diagnosis Wound #1: Same as Pre-Procedure Plan Wound Cleansing: Wound #1 Left Calcaneus: Clean wound with Normal Saline. Anesthetic: Wound #1 Left Calcaneus: Topical Lidocaine 4% cream applied to wound bed prior to debridement - clinic use Primary Wound Dressing: Wound #1 Left Calcaneus: Santyl Ointment Secondary Dressing: Wound #1 Left Calcaneus: Dry Gauze Conform/Kerlix Other - allevyn heel cup, tape Dressing Change Frequency: Wound #1 Left Calcaneus: Huhta, Knute D. ([id number]) Change dressing every day. Follow-up Appointments: Wound #1 Left Calcaneus: Return Appointment in 1 week. Off-Loading: Wound #1 Left Calcaneus: Turn and reposition every 2 hours Other: - keep pressure off the heel Additional Orders / Instructions: Wound #1 Left Calcaneus: Stop Smoking Increase protein intake. Home Health: Wound #1 Left Calcaneus: Continue Home Health Visits - [facility] Nurse may visit PRN to address patient s wound care needs. FACE TO FACE ENCOUNTER: MEDICARE and MEDICAID PATIENTS: I certify that this patient is under my care and that I had a face-to-face encounter that meets the physician face-to-face encounter requirements with this patient on this date. The encounter with the patient was in whole or in part for the following MEDICAL CONDITION: (primary reason for [place]) MEDICAL NECESSITY: I certify, that based on  my findings, NURSING services are a medically necessary home health service. HOME BOUND STATUS: I certify that my clinical findings support that this patient is homebound (i.e., Due to illness or injury, pt requires aid of supportive devices such as crutches, cane, wheelchairs, walkers, the use of special transportation or the assistance of another person to leave their place of  residence. There is a normal inability to leave the home and doing so requires considerable and taxing effort. Other absences are for medical reasons / religious services and are infrequent or of short duration when for other reasons). If current dressing causes regression in wound condition, may D/C ordered dressing product/s and apply Normal Saline Moist Dressing daily until next [place] / Other MD appointment. [place] of regression in wound condition at [phone]. Please direct any NON-WOUND related issues/requests for orders to patient's Primary Care Physician Medications-please add to medication list.: Wound #1 Left Calcaneus: Santyl Enzymatic Ointment Other: - vitamin c, vitamin a, zinc, MVI After review I have recommended: 1. Santyl ointment to be applied daily and covered with a heel cup 2. Offloading has been discussed in great detail 3. A posterior offloading shoe has been provided 4. Arterial duplex study of the left lower extremity -- appointment still pending 5. regular visits the wound center The power of attorney has been very compliant and all questions have been answered. Jeremy, [name] ([id number]) Electronic Signature(s) Signed: 07/17/2016 2:06:10 PM By: Christin Fudge MD, FACS Entered By: Christin Fudge on 07/17/2016 14:06:10 Jeremy, STRYCHARZ D. ([id number]) -------------------------------------------------------------------------------- SuperBill Details Patient Name: Jeremy Gang D. Date of Service: 07/17/2016 Medical Record Number: [id number] Patient Account Number: [id number] Date of Birth/Sex: 11/19/1940 (76 y.o. Male) Treating RN: Ahmed Prima Primary Care Provider: [name] Other Clinician: Referring Provider: [name] Treating Provider/Extender: Frann Rider in Treatment: 3 Diagnosis Coding ICD-10 Codes Code Description [phone] Pressure ulcer of left heel, 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 Peripheral vascular disease,  unspecified F17.218 Nicotine dependence, cigarettes, with other nicotine-induced disorders Facility Procedures CPT4 Code Description: [id number] - DEB SUBQ TISSUE 20 SQ CM/< ICD-10 Description Diagnosis 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, 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 Peripheral vascular disease, unspecified F17.218 Nicotine dependence, cigarettes, with other nicoti Modifier: ne-induced di Quantity: 1 sorders Physician Procedures CPT4 Code Description: [id number] - WC PHYS SUBQ TISS 20 SQ CM ICD-10 Description Diagnosis 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, 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 Peripheral vascular disease, unspecified F17.218 Nicotine dependence, cigarettes, with other nicoti Modifier: ne-induced di Quantity: 1 sorders Electronic Signature(s) Signed: 07/17/2016 2:06:23 PM By: Christin Fudge MD, FACS Entered By: Christin Fudge on 07/17/2016 14:06:22

## 2016-07-24 ENCOUNTER — Ambulatory Visit: Payer: Medicare Other | Admitting: Surgery

## 2016-07-24 ENCOUNTER — Other Ambulatory Visit: Payer: Self-pay | Admitting: Surgery

## 2016-07-24 DIAGNOSIS — L97509 Non-pressure chronic ulcer of other part of unspecified foot with unspecified severity: Secondary | ICD-10-CM

## 2016-07-30 ENCOUNTER — Ambulatory Visit (HOSPITAL_COMMUNITY)
Admission: RE | Admit: 2016-07-30 | Discharge: 2016-07-30 | Disposition: A | Discharge status: Home / Self Care | Payer: Medicare Other | Source: Ambulatory Visit | Attending: Cardiovascular Disease | Admitting: Cardiovascular Disease

## 2016-07-30 DIAGNOSIS — L97529 Non-pressure chronic ulcer of other part of left foot with unspecified severity: Secondary | ICD-10-CM | POA: Insufficient documentation

## 2016-07-30 DIAGNOSIS — E785 Hyperlipidemia, unspecified: Secondary | ICD-10-CM | POA: Diagnosis not present

## 2016-07-30 DIAGNOSIS — L97509 Non-pressure chronic ulcer of other part of unspecified foot with unspecified severity: Secondary | ICD-10-CM | POA: Diagnosis present

## 2016-07-30 DIAGNOSIS — I70202 Unspecified atherosclerosis of native arteries of extremities, left leg: Secondary | ICD-10-CM | POA: Diagnosis not present

## 2016-07-30 DIAGNOSIS — I7389 Other specified peripheral vascular diseases: Secondary | ICD-10-CM | POA: Diagnosis not present

## 2016-07-30 DIAGNOSIS — I10 Essential (primary) hypertension: Secondary | ICD-10-CM | POA: Insufficient documentation

## 2016-07-30 DIAGNOSIS — I708 Atherosclerosis of other arteries: Secondary | ICD-10-CM | POA: Diagnosis not present

## 2016-07-30 DIAGNOSIS — I70291 Other atherosclerosis of native arteries of extremities, right leg: Secondary | ICD-10-CM | POA: Diagnosis not present

## 2016-07-30 DIAGNOSIS — Z720 Tobacco use: Secondary | ICD-10-CM | POA: Insufficient documentation

## 2016-07-31 ENCOUNTER — Encounter: Payer: Medicare Other | Admitting: Surgery

## 2016-07-31 DIAGNOSIS — L89624 Pressure ulcer of left heel, stage 4: Secondary | ICD-10-CM | POA: Diagnosis not present

## 2016-08-01 NOTE — Progress Notes (Signed)
FELTON, BUCZYNSKI ([id number]) Visit Report for 07/31/2016 Arrival Information Details Patient Name: ZAILEN, ALBARRAN. Date of Service: 07/31/2016 9:45 AM Medical Record Number: [id number] Patient Account Number: [id number] Date of Birth/Sex: [birth year] (76 y.o. Male) Treating RN: Ahmed Prima Primary Care Valiant Dills: [name] Other Clinician: Referring Rumeal Cullipher: [name] Treating Roshanda Balazs/Extender: Frann Rider in Treatment: 5 Visit Information History Since Last Visit All ordered tests and consults were completed: No Patient Arrived: Ambulatory Added or deleted any medications: No Arrival Time: 09:52 Any new allergies or adverse reactions: No Accompanied By: caregiver Had a fall or experienced change in No Transfer Assistance: None activities of daily living that may affect Patient Identification Verified: Yes risk of falls: Secondary Verification Process Yes Signs or symptoms of abuse/neglect since last No Completed: visito Patient Requires Transmission-Based No Hospitalized since last visit: No Precautions: Has Dressing in Place as Prescribed: Yes Patient Has Alerts: No Pain Present Now: No Electronic Signature(s) Signed: 07/31/2016 5:04:43 PM By: Alric Quan Entered By: Alric Quan on 07/31/2016 09:53:15 Besson, Owyn D. ([id number]) -------------------------------------------------------------------------------- Encounter Discharge Information Details Patient Name: [name] Date of Service: 07/31/2016 9:45 AM Medical Record Number: [id number] Patient Account Number: [id number] Date of Birth/Sex: 06-Jun-1940 (76 y.o. Male) Treating RN: Ahmed Prima Primary Care Novice Vrba: [name] Other Clinician: Referring Precilla Purnell: [name] Treating Jasmin Winberry/Extender: Frann Rider in Treatment: 5 Encounter Discharge Information Items Discharge Pain Level: 0 Discharge Condition: Stable Ambulatory Status: Ambulatory Discharge  Destination: Home Transportation: Private Auto Accompanied By: caregiver Schedule Follow-up Appointment: Yes Medication Reconciliation completed and provided to Patient/Care No Daley Gosse: Provided on Clinical Summary of Care: 07/31/2016 Form Type Recipient Paper Patient [facility] Electronic Signature(s) Signed: 07/31/2016 10:25:54 AM By: Ruthine Dose Entered By: Ruthine Dose on 07/31/2016 10:25:54 Appleby, Eden D. ([id number]) -------------------------------------------------------------------------------- Lower Extremity Assessment Details Patient Name: [name] Date of Service: 07/31/2016 9:45 AM Medical Record Number: [id number] Patient Account Number: [id number] Date of Birth/Sex: [birth year] (76 y.o. Male) Treating RN: Ahmed Prima Primary Care Dreanna Kyllo: [name] Other Clinician: Referring Larrissa Stivers: [name] Treating Reola Buckles/Extender: Frann Rider in Treatment: 5 Vascular Assessment Pulses: Dorsalis Pedis Palpable: [Left:No] Doppler Audible: [Left:Yes] Posterior Tibial Extremity colors, hair growth, and conditions: Extremity Color: [Left:Hyperpigmented] Temperature of Extremity: [Left:Cool] Capillary Refill: [Left:< 3 seconds] Toe Nail Assessment Left: Right: Thick: No Discolored: No Deformed: No Improper Length and Hygiene: No Electronic Signature(s) Signed: 07/31/2016 5:04:43 PM By: Alric Quan Entered By: Alric Quan on 07/31/2016 10:01:31 Cai, Gadge D. ([id number]) -------------------------------------------------------------------------------- Multi Wound Chart Details Patient Name: [name] Date of Service: 07/31/2016 9:45 AM Medical Record Number: [id number] Patient Account Number: [id number] Date of Birth/Sex: 19-Aug-1940 (76 y.o. Male) Treating RN: Ahmed Prima Primary Care Bethzy Hauck: [name] Other Clinician: Referring Tamanna Whitson: [name] Treating Orva Gwaltney/Extender: Frann Rider in  Treatment: 5 Vital Signs Height(in): Pulse(bpm): 52 Weight(lbs): 128.8 Blood Pressure 170/72 (mmHg): Body Mass Index(BMI): Temperature(F): 97.5 Respiratory Rate 18 (breaths/min): Photos: [1:No Photos] [N/A:N/A] Wound Location: [1:Left Calcaneus] [N/A:N/A] Wounding Event: [1:Pressure Injury] [N/A:N/A] Primary Etiology: [1:Pressure Ulcer] [N/A:N/A] Comorbid History: [1:Chronic Obstructive Pulmonary Disease (COPD), Arrhythmia, Hypertension, Seizure Disorder] [N/A:N/A] Date Acquired: [1:05/08/2016] [N/A:N/A] Weeks of Treatment: [1:5] [N/A:N/A] Wound Status: [1:Open] [N/A:N/A] Measurements L x W x D 0.5x0.9x0.1 [N/A:N/A] (cm) Area (cm) : [1:0.353] [N/A:N/A] Volume (cm) : [1:0.035] [N/A:N/A] % Reduction in Area: [1:95.40%] [N/A:N/A] % Reduction in Volume: 98.50% [N/A:N/A] 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] [N/A:N/A] Exudate Amount: [1:Large] [N/A:N/A] Exudate Type: [1:Serous] [N/A:N/A] Exudate Color: [1:amber] [N/A:N/A] Foul Odor After [1:Yes] [N/A:N/A] Cleansing: Odor Anticipated Due to  No [N/A:N/A] Product Use: Wound Margin: [1:Distinct, outline attached] [N/A:N/A] Granulation Amount: [1:Large (67-100%)] [N/A:N/A] Granulation Quality: [1:Red] [N/A:N/A] Necrotic Amount: [1:Small (1-33%)] [N/A:N/A] Epithelialization: Small (1-33%) N/A N/A Debridement: Debridement (01027- N/A N/A 11047) Pre-procedure 10:15 N/A N/A Verification/Time Out Taken: Pain Control: Lidocaine 4% Topical N/A N/A Solution Tissue Debrided: Fibrin/Slough, Exudates, N/A N/A Subcutaneous Level: Skin/Subcutaneous N/A N/A Tissue Debridement Area (sq 0.45 N/A N/A cm): Instrument: Curette N/A N/A Bleeding: Minimum N/A N/A Hemostasis Achieved: Pressure N/A N/A Procedural Pain: 0 N/A N/A Post Procedural Pain: 0 N/A N/A Debridement Treatment Procedure was tolerated N/A N/A Response: well Post Debridement 0.5x0.9x0.2 N/A N/A Measurements L x W x D (cm) Post Debridement 0.071 N/A N/A Volume:  (cm) Post Debridement Category/Stage III N/A N/A Stage: Periwound Skin Texture: No Abnormalities Noted N/A N/A Periwound Skin No Abnormalities Noted N/A N/A Moisture: Periwound Skin Color: No Abnormalities Noted N/A N/A Temperature: No Abnormality N/A N/A Tenderness on Yes N/A N/A Palpation: Wound Preparation: Ulcer Cleansing: N/A N/A Rinsed/Irrigated with Saline Topical Anesthetic Applied: Other: lidocaine 4% Procedures Performed: Debridement N/A N/A Treatment Notes Wound #1 (Left Calcaneus) 1. Cleansed with: Clean wound with Normal Saline 2. Anesthetic Ladd, Kellyn D. ([id number]) Topical Lidocaine 4% cream to wound bed prior to debridement 4. Dressing Applied: Prisma Ag 5. Secondary Dressing Applied Dry Gauze Kerlix/Conform 7. Secured with Tape Notes heel cup Electronic Signature(s) Signed: 07/31/2016 10:20:33 AM By: Christin Fudge MD, FACS Entered By: Christin Fudge on 07/31/2016 10:20:32 MOHAB, ASHBY ([id number]) -------------------------------------------------------------------------------- [place] Details Patient Name: BRAEDIN, MILLHOUSE D. Date of Service: 07/31/2016 9:45 AM Medical Record Number: [id number] Patient Account Number: [id number] Date of Birth/Sex: 07/24/1940 (76 y.o. Male) Treating RN: Ahmed Prima Primary Care Charity Tessier: [name] Other Clinician: Referring Nayanna Seaborn: [name] Treating Jaycen Vercher/Extender: Frann Rider in Treatment: 5 Active Inactive ` Abuse / Safety / Falls / Self Care Management Nursing Diagnoses: Potential for falls Goals: Patient will remain injury free Date Initiated: 06/26/2016 Target Resolution Date: 10/11/2016 Goal Status: Active Interventions: Assess fall risk on admission and as needed Assess self care needs on admission and as needed Notes: ` Nutrition Nursing Diagnoses: Imbalanced nutrition Goals: Patient/caregiver agrees to and verbalizes understanding of need to use  nutritional supplements and/or vitamins as prescribed Date Initiated: 06/26/2016 Target Resolution Date: 10/11/2016 Goal Status: Active Interventions: Assess patient nutrition upon admission and as needed per policy Notes: ` Orientation to the Wound Care Program Nursing Diagnoses: SMOKEY, MELOTT ([id number]) Knowledge deficit related to the wound healing center program Goals: Patient/caregiver will verbalize understanding of the [place] Date Initiated: 06/26/2016 Target Resolution Date: 07/12/2016 Goal Status: Active Interventions: Provide education on orientation to the wound center Notes: ` Pain, Acute or Chronic Nursing Diagnoses: Pain, acute or chronic: actual or potential Potential alteration in comfort, pain Goals: Patient/caregiver will verbalize adequate pain control between visits Date Initiated: 06/26/2016 Target Resolution Date: 10/11/2016 Goal Status: Active Interventions: Assess comfort goal upon admission Complete pain assessment as per visit requirements Notes: ` Wound/Skin Impairment Nursing Diagnoses: Impaired tissue integrity Knowledge deficit related to smoking impact on wound healing Goals: Ulcer/skin breakdown will have a volume reduction of 80% by week 12 Date Initiated: 06/26/2016 Target Resolution Date: 09/06/2016 Goal Status: Active Interventions: Assess patient/caregiver ability to perform ulcer/skin care regimen upon admission and as needed Assess ulceration(s) every visit Provide education on smoking NORVIL, MARTENSEN ([id number]) Notes: Electronic Signature(s) Signed: 07/31/2016 5:04:43 PM By: Alric Quan Entered By: Alric Quan on 07/31/2016 10:02:00 Watton, Aramis D. ([id number]) --------------------------------------------------------------------------------  Pain Assessment Details Patient Name: SION, REINDERS. Date of Service: 07/31/2016 9:45 AM Medical Record Number: [id number] Patient Account Number: [id number] Date  of Birth/Sex: 1940/05/22 (76 y.o. Male) Treating RN: Ahmed Prima Primary Care Sun Kihn: [name] Other Clinician: Referring Berlie Hatchel: [name] Treating Kaleah Hagemeister/Extender: Frann Rider in Treatment: 5 Active Problems Location of Pain Severity and Description of Pain Patient Has Paino No Site Locations With Dressing Change: No Pain Management and Medication Current Pain Management: Electronic Signature(s) Signed: 07/31/2016 5:04:43 PM By: Alric Quan Entered By: Alric Quan on 07/31/2016 09:53:22 Barb, Yovany D. ([id number]) -------------------------------------------------------------------------------- Patient/Caregiver Education Details Patient Name: [name] Date of Service: 07/31/2016 9:45 AM Medical Record Number: [id number] Patient Account Number: [id number] Date of Birth/Gender: Jan 19, 1941 (76 y.o. Male) Treating RN: Ahmed Prima Primary Care Physician: [name] Other Clinician: Referring Physician: [name] Treating Physician/Extender: Frann Rider in Treatment: 5 Education Assessment Education Provided To: Patient Education Topics Provided Wound/Skin Impairment: Handouts: Other: change dressing as ordered Methods: Demonstration, Explain/Verbal Responses: State content correctly Electronic Signature(s) Signed: 07/31/2016 5:04:43 PM By: Alric Quan Entered By: Alric Quan on 07/31/2016 10:11:56 Bishop, Raidyn D. ([id number]) -------------------------------------------------------------------------------- Wound Assessment Details Patient Name: [name] Date of Service: 07/31/2016 9:45 AM Medical Record Number: [id number] Patient Account Number: [id number] Date of Birth/Sex: 10-May-1940 (76 y.o. Male) Treating RN: Ahmed Prima Primary Care Sheanna Dail: [name] Other Clinician: Referring Emiya Loomer: [name] Treating Ceairra Mccarver/Extender: Frann Rider in Treatment: 5 Wound  Status Wound Number: 1 Primary Pressure Ulcer Etiology: Wound Location: Left Calcaneus Wound Open Wounding Event: Pressure Injury Status: Date Acquired: 05/08/2016 Comorbid Chronic Obstructive Pulmonary Weeks Of Treatment: 5 History: Disease (COPD), Arrhythmia, Clustered Wound: No Hypertension, Seizure Disorder Photos Photo Uploaded By: Alric Quan on 07/31/2016 16:54:09 Wound Measurements Length: (cm) 0.5 Width: (cm) 0.9 Depth: (cm) 0.1 Area: (cm) 0.353 Volume: (cm) 0.035 % Reduction in Area: 95.4% % Reduction in Volume: 98.5% Epithelialization: Small (1-33%) Tunneling: No Undermining: No Wound Description Classification: Category/Stage III Foul Odor Aft Wound Margin: Distinct, outline attached Due to Produc Exudate Amount: Large Slough/Fibrin Exudate Type: Serous Exudate Color: amber er Cleansing: Yes t Use: No o Yes Wound Bed Granulation Amount: Large (67-100%) Granulation Quality: Red Necrotic Amount: Small (1-33%) Necrotic Quality: Adherent [id number], Timothee D. ([id number]) Periwound Skin Texture Texture Color No Abnormalities Noted: No No Abnormalities Noted: No Moisture Temperature / Pain No Abnormalities Noted: No Temperature: No Abnormality Tenderness on Palpation: Yes Wound Preparation Ulcer Cleansing: Rinsed/Irrigated with Saline Topical Anesthetic Applied: Other: lidocaine 4%, Treatment Notes Wound #1 (Left Calcaneus) 1. Cleansed with: Clean wound with Normal Saline 2. Anesthetic Topical Lidocaine 4% cream to wound bed prior to debridement 4. Dressing Applied: Prisma Ag 5. Secondary Dressing Applied Dry Gauze Kerlix/Conform 7. Secured with Tape Notes heel cup Electronic Signature(s) Signed: 07/31/2016 5:04:43 PM By: Alric Quan Entered By: Alric Quan on 07/31/2016 10:00:06 GERVIS, GABA D. ([id number]) -------------------------------------------------------------------------------- Vitals Details Patient Name: [name] Date of Service: 07/31/2016 9:45 AM Medical Record Number: [id number] Patient Account Number: [id number] Date of Birth/Sex: March 30, 1941 (76 y.o. Male) Treating RN: Ahmed Prima Primary Care Laketta Soderberg: [name] Other Clinician: Referring Selso Mannor: [name] Treating Johnda Billiot/Extender: Frann Rider in Treatment: 5 Vital Signs Time Taken: 09:53 Temperature (F): 97.5 Weight (lbs): 128.8 Pulse (bpm): 52 Respiratory Rate (breaths/min): 18 Blood Pressure (mmHg): 170/72 Reference Range: 80 - 120 mg / dl Notes Made MD aware of BP. Electronic Signature(s) Signed: 07/31/2016 5:04:43 PM By: Alric Quan Entered By: Alric Quan on 07/31/2016  09:55:50 

## 2016-08-01 NOTE — Progress Notes (Addendum)
ELADIO, DENTREMONT ([id number]) Visit Report for 07/31/2016 Chief Complaint Document Details Patient Name: [name], [name] Date of Service: 07/31/2016 9:45 AM Medical Record Number: [id number] Patient Account Number: [id number] Date of Birth/Sex: 1940/10/10 (76 y.o. Male) Treating RN: Ahmed Prima Primary Care Provider: [name] Other Clinician: Referring Provider: [name] Treating Provider/Extender: Frann Rider in Treatment: 5 Information Obtained from: Patient Chief Complaint Patient is at the clinic for treatment of an open pressure ulcer the left heel which she's had for about 2 months Electronic Signature(s) Signed: 07/31/2016 10:20:50 AM By: Christin Fudge MD, FACS Entered By: Christin Fudge on 07/31/2016 10:20:50 TRASE, BUNDA D. ([id number]) -------------------------------------------------------------------------------- Debridement Details Patient Name: Jeremy Gang D. Date of Service: 07/31/2016 9:45 AM Medical Record Number: [id number] Patient Account Number: [id number] Date of Birth/Sex: 01/28/1941 (77 y.o. Male) Treating RN: Ahmed Prima Primary Care Provider: [name] Other Clinician: Referring Provider: [name] Treating Provider/Extender: Frann Rider in Treatment: 5 Debridement Performed for Wound #1 Left Calcaneus Assessment: Performed By: Physician Christin Fudge, MD Debridement: Debridement Pre-procedure Yes - 10:15 Verification/Time Out Taken: Start Time: 10:16 Pain Control: Lidocaine 4% Topical Solution Level: Skin/Subcutaneous Tissue Total Area Debrided (L x 0.5 (cm) x 0.9 (cm) = 0.45 (cm) W): Tissue and other Viable, Non-Viable, Exudate, Fibrin/Slough, Subcutaneous material debrided: Instrument: Curette Bleeding: Minimum Hemostasis Achieved: Pressure End Time: 10:18 Procedural Pain: 0 Post Procedural Pain: 0 Response to Treatment: Procedure was tolerated well Post Debridement Measurements of Total  Wound Length: (cm) 0.5 Stage: Category/Stage III Width: (cm) 0.9 Depth: (cm) 0.2 Volume: (cm) 0.071 Character of Wound/Ulcer Post Requires Further Debridement: Debridement Severity of Tissue Post Fat layer exposed Debridement: Post Procedure Diagnosis Same as Pre-procedure Electronic Signature(s) Signed: 07/31/2016 10:20:44 AM By: Christin Fudge MD, FACS Signed: 07/31/2016 5:04:43 PM By: Alric Quan Entered By: Christin Fudge on 07/31/2016 10:20:43 [name], [name] ([id number]) [name], [name] ([id number]) -------------------------------------------------------------------------------- HPI Details Patient Name: [name], [name] Date of Service: 07/31/2016 9:45 AM Medical Record Number: [id number] Patient Account Number: [id number] Date of Birth/Sex: 04/25/1940 (76 y.o. Male) Treating RN: Ahmed Prima Primary Care Provider: [name] Other Clinician: Referring Provider: [name] Treating Provider/Extender: Frann Rider in Treatment: 5 History of Present Illness Location: left heel ulceration Quality: Patient reports experiencing a dull pain to affected area(s). Severity: Patient states wound are getting worse. Duration: Patient has had the wound for > 2 months prior to seeking treatment at the wound center Timing: Pain in wound is Intermittent (comes and goes Context: The wound appeared gradually over time Modifying Factors: Other treatment(s) tried include:local care and offloading Associated Signs and Symptoms: Patient reports having difficulty standing for long periods. HPI Description: 76 year old gentleman who is a reformed alcoholic for about 6 months now was referred to as by his PCP team for a left heel ulceration which she's had for about 2 months. He has a history of alcohol abuse, aneurysm, hypertensive renal disease, anemia of chronic kidney disease, atrial fibrillation with RVR, COPD, hypertension, seizure disorders, tobacco abuse, cerebral  aneurysm repair in 1997 and some ice surgery. He continues to smoke about half a pack of cigarettes a day. the patient is not fully competent and has a part of attorney along with him today. 07/03/2016 -- x-ray of the left heel shows a soft tissue wound with no bony destruction or concerns for osteomyelitis. 07/10/2016 -- arterial studies are still pending. 07/30/2016 -- lower arterial examination done shows a right ABI of 0.85 and a left ABI of 0.59. 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 Bilateral CFA stenosis. Occluded left mid distal SFA. Three-vessel runoff bilaterally. The bilateral great toe pressures were also abnormal in the 10 digit toe PPGos are abnormal. PV consult suggested -- to see Dr. Fletcher Anon. appointment scheduled for this coming Tuesday Electronic Signature(s) Signed: 07/31/2016 10:20:59 AM By: Christin Fudge MD, FACS Previous Signature: 07/31/2016 10:19:31 AM Version By: Christin Fudge MD, FACS Previous Signature: 07/31/2016 10:12:15 AM Version By: Christin Fudge MD, FACS Entered By: Christin Fudge on 07/31/2016 10:20:58 [name] ([id number]) -------------------------------------------------------------------------------- Physical Exam Details Patient Name: [name], [name] Date of Service: 07/31/2016 9:45 AM Medical Record Number: [id number] Patient Account Number: [id number] Date of Birth/Sex: 1940/05/06 (76 y.o. Male) Treating RN: Ahmed Prima Primary Care Provider: [name] Other Clinician: Referring Provider: [name] Treating Provider/Extender: Frann Rider in Treatment: 5 Constitutional . Pulse regular. Respirations normal and unlabored. Afebrile. . Eyes Nonicteric. Reactive to light. Ears, Nose, Mouth, and Throat Lips, teeth, and gums WNL.Marland Kitchen Moist mucosa without lesions. Neck supple and nontender. No palpable  supraclavicular or cervical adenopathy. Normal sized without goiter. Respiratory WNL. No retractions.. Breath sounds WNL, No rubs, rales, rhonchi, or wheeze.. Cardiovascular Heart rhythm and rate regular, no murmur or gallop.. Pedal Pulses WNL. No clubbing, cyanosis or edema. Lymphatic No adneopathy. No adenopathy. No adenopathy. Musculoskeletal Adexa without tenderness or enlargement.. Digits and nails w/o clubbing, cyanosis, infection, petechiae, ischemia, or inflammatory conditions.. Integumentary (Hair, Skin) No suspicious lesions. No crepitus or fluctuance. No peri-wound warmth or erythema. No masses.Marland Kitchen Psychiatric Judgement and insight Intact.. No evidence of depression, anxiety, or agitation.. Notes the wound is smaller but continues to have some subcutaneous debris which was sharply removed with a #3 curet and bleeding controlled with pressure. Electronic Signature(s) Signed: 07/31/2016 10:21:31 AM By: Christin Fudge MD, FACS Entered By: Christin Fudge on 07/31/2016 10:21:31 [name], [name] ([id number]) -------------------------------------------------------------------------------- Physician Orders Details Patient Name: Jeremy Gang D. Date of Service: 07/31/2016 9:45 AM Medical Record Number: [id number] Patient Account Number: [id number] Date of Birth/Sex: June 23, 1940 (76 y.o. Male) Treating RN: Ahmed Prima Primary Care Provider: [name] Other Clinician: Referring Provider: [name] Treating Provider/Extender: Frann Rider in Treatment: 5 Verbal / Phone Orders: Yes Clinician: Carolyne Fiscal, Debi Read Back and Verified: Yes Diagnosis Coding Wound Cleansing Wound #1 Left Calcaneus o Clean wound with Normal Saline. Anesthetic Wound #1 Left Calcaneus o Topical Lidocaine 4% cream applied to wound bed prior to debridement - clinic use Primary Wound Dressing Wound #1 Left Calcaneus o Prisma Ag - moisten with saline Secondary Dressing Wound #1 Left  Calcaneus o Dry Gauze o Conform/Kerlix o Other - allevyn heel cup, tape Dressing Change Frequency Wound #1 Left Calcaneus o Change dressing every other day. Follow-up Appointments Wound #1 Left Calcaneus o Return Appointment in 1 week. Off-Loading Wound #1 Left Calcaneus o Turn and reposition every 2 hours o Other: - keep pressure off the heel Additional Orders / Instructions Wound #1 Left Calcaneus o Stop Smoking o Increase protein intake. [name], [name] ([id number]) Home Health Wound #1 Left [place] Nurse may visit PRN to address patientos wound care needs. o FACE TO FACE ENCOUNTER: MEDICARE and MEDICAID PATIENTS: I certify that this patient is under my care and that I had a face-to-face encounter that meets the physician face-to-face encounter requirements with this patient on this date. The encounter with  the patient was in whole or in part for the following MEDICAL CONDITION: (primary reason for [place]) MEDICAL NECESSITY: I certify, that based on my findings, NURSING services are a medically necessary home health service. HOME BOUND STATUS: I certify that my clinical findings support that this patient is homebound (i.e., Due to illness or injury, pt requires aid of supportive devices such as crutches, cane, wheelchairs, walkers, the use of special transportation or the assistance of another person to leave their place of residence. There is a normal inability to leave the home and doing so requires considerable and taxing effort. Other absences are for medical reasons / religious services and are infrequent or of short duration when for other reasons). o If current dressing causes regression in wound condition, may D/C ordered dressing product/s and apply Normal Saline Moist Dressing daily until next [place] / Other MD appointment. [place] of  regression in wound condition at [phone]. o Please direct any NON-WOUND related issues/requests for orders to patient's Primary Care Physician Medications-please add to medication list. Wound #1 Left Calcaneus o Other: - vitamin c, vitamin a, zinc, MVI Electronic Signature(s) Signed: 07/31/2016 4:50:03 PM By: Christin Fudge MD, FACS Signed: 07/31/2016 5:04:43 PM By: Alric Quan Entered By: Alric Quan on 07/31/2016 10:18:10 [name], [name] ([id number]) -------------------------------------------------------------------------------- Problem List Details Patient Name: [name], [name] Date of Service: 07/31/2016 9:45 AM Medical Record Number: [id number] Patient Account Number: [id number] Date of Birth/Sex: [birth year] (76 y.o. Male) Treating RN: Ahmed Prima Primary Care Provider: [name] Other Clinician: Referring Provider: [name] Treating Provider/Extender: Frann Rider in Treatment: 5 Active Problems ICD-10 Encounter Code Description Active Date Diagnosis [phone] Pressure ulcer of left heel, stage 4 06/26/2016 Yes F17.218 Nicotine dependence, cigarettes, with other nicotine- 06/26/2016 Yes induced disorders I70.245 Atherosclerosis of native arteries of left leg with ulceration 07/31/2016 Yes of other part of foot Inactive Problems Resolved Problems Electronic Signature(s) Signed: 07/31/2016 10:20:25 AM By: Christin Fudge MD, FACS Entered By: Christin Fudge on 07/31/2016 10:20:25 Jeremy Gang D. ([id number]) -------------------------------------------------------------------------------- Progress Note Details Patient Name: Jeremy Gang D. Date of Service: 07/31/2016 9:45 AM Medical Record Number: [id number] Patient Account Number: [id number] Date of Birth/Sex: [birth year] (76 y.o. Male) Treating RN: Ahmed Prima Primary Care Provider: [name] Other Clinician: Referring Provider: [name] Treating Provider/Extender: Frann Rider in Treatment: 5 Subjective Chief Complaint Information obtained from Patient Patient is at the clinic for treatment of an open pressure ulcer the left heel which she's had for about 2 months History of Present Illness (HPI) The following HPI elements were documented for the patient's wound: Location: left heel ulceration Quality: Patient reports experiencing a dull pain to affected area(s). Severity: Patient states wound are getting worse. Duration: Patient has had the wound for > 2 months prior to seeking treatment at the wound center Timing: Pain in wound is Intermittent (comes and goes Context: The wound appeared gradually over time Modifying Factors: Other treatment(s) tried include:local care and offloading Associated Signs and Symptoms: Patient reports having difficulty standing for long periods. 76 year old gentleman who is a reformed alcoholic for about 6 months now was referred to as by his PCP team for a left heel ulceration which she's had for about 2 months. He has a history of alcohol abuse, aneurysm, hypertensive renal disease, anemia of chronic kidney disease, atrial fibrillation with RVR, COPD, hypertension, seizure disorders, tobacco abuse, cerebral aneurysm repair in 1997 and some ice surgery. He continues to smoke about half a  pack of cigarettes a day. the patient is not fully competent and has a part of attorney along with him today. 07/03/2016 -- x-ray of the left heel shows a soft tissue wound with no bony destruction or concerns for osteomyelitis. 07/10/2016 -- arterial studies are still pending. 07/30/2016 -- lower arterial examination done shows a right ABI of 0.85 and a left ABI of 0.59. 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 Bilateral CFA stenosis. Occluded left mid distal SFA. Three-vessel runoff bilaterally. The bilateral great toe  pressures were also abnormal in the 10 digit toe PPG s are abnormal. PV consult suggested -- to see Dr. Fletcher Anon. appointment scheduled for this coming Tuesday [name], SHANHOLTZER. ([id number]) Objective Constitutional Pulse regular. Respirations normal and unlabored. Afebrile. Vitals Time Taken: 9:53 AM, Weight: 128.8 lbs, Temperature: 97.5 F, Pulse: 52 bpm, Respiratory Rate: 18 breaths/min, Blood Pressure: 170/72 mmHg. General Notes: Made MD aware of BP. Eyes Nonicteric. Reactive to light. Ears, Nose, Mouth, and Throat Lips, teeth, and gums WNL.Marland Kitchen Moist mucosa without lesions. Neck supple and nontender. No palpable supraclavicular or cervical adenopathy. Normal sized without goiter. Respiratory WNL. No retractions.. Breath sounds WNL, No rubs, rales, rhonchi, or wheeze.. Cardiovascular Heart rhythm and rate regular, no murmur or gallop.. Pedal Pulses WNL. No clubbing, cyanosis or edema. Lymphatic No adneopathy. No adenopathy. No adenopathy. Musculoskeletal Adexa without tenderness or enlargement.. Digits and nails w/o clubbing, cyanosis, infection, petechiae, ischemia, or inflammatory conditions.Marland Kitchen Psychiatric Judgement and insight Intact.. No evidence of depression, anxiety, or agitation.. General Notes: the wound is smaller but continues to have some subcutaneous debris which was sharply removed with a #3 curet and bleeding controlled with pressure. Integumentary (Hair, Skin) No suspicious lesions. No crepitus or fluctuance. No peri-wound warmth or erythema. No masses.. Wound #1 status is Open. Original cause of wound was Pressure Injury. The wound is located on the Left Calcaneus. The wound measures 0.5cm length x 0.9cm width x 0.1cm depth; 0.353cm^2 area and 0.035cm^3 volume. There is no tunneling or undermining noted. There is a large amount of serous drainage noted. The wound margin is distinct with the outline attached to the wound base. There is large (67-100%) red granulation  within the wound bed. There is a small (1-33%) amount of necrotic tissue within the wound bed including Adherent Slough. Periwound temperature was noted as No Abnormality. The periwound has Coye, Terrian D. ([id number]) tenderness on palpation. Assessment Active Problems ICD-10 L89.624 - Pressure ulcer of left heel, stage 4 F17.218 - Nicotine dependence, cigarettes, with other nicotine-induced disorders I70.245 - Atherosclerosis of native arteries of left leg with ulceration of other part of foot Procedures Wound #1 Wound #1 is a Pressure Ulcer located on the Left Calcaneus . There was a Skin/Subcutaneous Tissue Debridement ([id number]) debridement with total area of 0.45 sq cm performed by Christin Fudge, MD. with the following instrument(s): Curette to remove Viable and Non-Viable tissue/material including Exudate, Fibrin/Slough, and Subcutaneous after achieving pain control using Lidocaine 4% Topical Solution. A time out was conducted at 10:15, prior to the start of the procedure. A Minimum amount of bleeding was controlled with Pressure. The procedure was tolerated well with a pain level of 0 throughout and a pain level of 0 following the procedure. Post Debridement Measurements: 0.5cm length x 0.9cm width x 0.2cm depth; 0.071cm^3 volume. Post debridement Stage noted as Category/Stage III. Character of Wound/Ulcer Post Debridement requires further debridement. Severity of  Tissue Post Debridement is: Fat layer exposed. Post procedure Diagnosis Wound #1: Same as Pre-Procedure Plan Wound Cleansing: Wound #1 Left Calcaneus: Clean wound with Normal Saline. Anesthetic: Wound #1 Left Calcaneus: Topical Lidocaine 4% cream applied to wound bed prior to debridement - clinic use Primary Wound Dressing: Wound #1 Left Calcaneus: [name], [name] ([id number]) Prisma Ag - moisten with saline Secondary Dressing: Wound #1 Left Calcaneus: Dry Gauze Conform/Kerlix Other - allevyn heel cup,  tape Dressing Change Frequency: Wound #1 Left Calcaneus: Change dressing every other day. Follow-up Appointments: Wound #1 Left Calcaneus: Return Appointment in 1 week. Off-Loading: Wound #1 Left Calcaneus: Turn and reposition every 2 hours Other: - keep pressure off the heel Additional Orders / Instructions: Wound #1 Left Calcaneus: Stop Smoking Increase protein intake. Home Health: Wound #1 Left Calcaneus: Continue Home Health Visits - [facility] Nurse may visit PRN to address patient s wound care needs. FACE TO FACE ENCOUNTER: MEDICARE and MEDICAID PATIENTS: I certify that this patient is under my care and that I had a face-to-face encounter that meets the physician face-to-face encounter requirements with this patient on this date. The encounter with the patient was in whole or in part for the following MEDICAL CONDITION: (primary reason for [place]) MEDICAL NECESSITY: I certify, that based on my findings, NURSING services are a medically necessary home health service. HOME BOUND STATUS: I certify that my clinical findings support that this patient is homebound (i.e., Due to illness or injury, pt requires aid of supportive devices such as crutches, cane, wheelchairs, walkers, the use of special transportation or the assistance of another person to leave their place of residence. There is a normal inability to leave the home and doing so requires considerable and taxing effort. Other absences are for medical reasons / religious services and are infrequent or of short duration when for other reasons). If current dressing causes regression in wound condition, may D/C ordered dressing product/s and apply Normal Saline Moist Dressing daily until next [place] / Other MD appointment. [place] of regression in wound condition at [phone]. Please direct any NON-WOUND related issues/requests for orders to patient's Primary Care  Physician Medications-please add to medication list.: Wound #1 Left Calcaneus: Other: - vitamin c, vitamin a, zinc, MVI After review I have recommended: 1. Prisma AG to be applied daily and covered with a heel cup Gapinski, Linford D. ([id number]) 2. Offloading has been discussed in great detail 3. A posterior offloading shoe has been provided 4. Arterial duplex study of the left lower extremity -- appointment to see the interventional cardiologist scheduled 5. regular visits the wound center The power of attorney has been very compliant and all questions have been answered. Electronic Signature(s) Signed: 07/31/2016 10:22:34 AM By: Christin Fudge MD, FACS Entered By: Christin Fudge on 07/31/2016 10:22:34 [name] ([id number]) -------------------------------------------------------------------------------- SuperBill Details Patient Name: Jeremy Gang D. Date of Service: 07/31/2016 Medical Record Number: [id number] Patient Account Number: [id number] Date of Birth/Sex: 03-25-1941 (76 y.o. Male) Treating RN: Ahmed Prima Primary Care Provider: [name] Other Clinician: Referring Provider: [name] Treating Provider/Extender: Frann Rider in Treatment: 5 Diagnosis Coding ICD-10 Codes Code Description [phone] Pressure ulcer of left heel, stage 4 F17.218 Nicotine dependence, cigarettes, with other nicotine-induced disorders I70.245 Atherosclerosis of native arteries of left leg with ulceration of other part of foot Facility Procedures CPT4: Description Modifier Quantity Code [id number] 11042 - DEB SUBQ TISSUE 20 SQ CM/< 1 ICD-10 Description Diagnosis L89.624 Pressure ulcer of  left heel, 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  F17.218 Nicotine dependence, cigarettes, with other nicotine-induced disorders Physician Procedures CPT4: Description Modifier Quantity Code [id number] 99213 - WC PHYS LEVEL 3 - EST PT 25  1 ICD-10 Description Diagnosis 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, stage 4 F17.218 Nicotine dependence, cigarettes, with other nicotine-induced disorders I70.245  Atherosclerosis of native arteries of left leg with ulceration of other part of foot CPT4: [id number] 11042 - WC PHYS SUBQ TISS 20 SQ CM 1 ICD-10 Description Diagnosis 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, 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

## 2016-08-02 ENCOUNTER — Other Ambulatory Visit: Payer: Self-pay | Admitting: Nurse Practitioner

## 2016-08-05 ENCOUNTER — Ambulatory Visit (INDEPENDENT_AMBULATORY_CARE_PROVIDER_SITE_OTHER): Payer: Medicare Other | Admitting: Cardiovascular Disease

## 2016-08-05 VITALS — BP 154/82 | HR 64 | Ht 66.0 in | Wt 123.2 lb

## 2016-08-05 DIAGNOSIS — E785 Hyperlipidemia, unspecified: Secondary | ICD-10-CM

## 2016-08-05 DIAGNOSIS — I480 Paroxysmal atrial fibrillation: Secondary | ICD-10-CM

## 2016-08-05 DIAGNOSIS — I739 Peripheral vascular disease, unspecified: Secondary | ICD-10-CM

## 2016-08-05 DIAGNOSIS — I10 Essential (primary) hypertension: Secondary | ICD-10-CM | POA: Diagnosis not present

## 2016-08-05 DIAGNOSIS — Z01812 Encounter for preprocedural laboratory examination: Secondary | ICD-10-CM | POA: Diagnosis not present

## 2016-08-05 DIAGNOSIS — R509 Fever, unspecified: Secondary | ICD-10-CM

## 2016-08-05 HISTORY — DX: Fever, unspecified: R50.9

## 2016-08-05 MED ORDER — ASPIRIN EC 81 MG PO TBEC: 81.0000 mg | DELAYED_RELEASE_TABLET | Freq: Every day | ORAL | 3 refills | Status: AC

## 2016-08-05 NOTE — Patient Instructions (Addendum)
Medication Instructions:  START Aspirin 81mg  daily  Labwork: Pre procedure lab work TODAY-(PT, PTT, BMP, CBC w/ diff)  Testing/Procedures:   [facility] [address] [place] [address] Dept: [phone] Loc: [phone]  ELVEN LABOY  08/05/2016  You are scheduled for a Peripheral Angiogram on Wednesday, May 9 with Dr. Kathlyn Sacramento.  1. Please arrive at the [facility] (Main Entrance [facility] at [facility] [address] [place], [place] [address] at 6:30 AM (two hours before your procedure to ensure your preparation). Free valet parking service is available.   Special note: Every effort is made to have your procedure done on time. Please understand that emergencies sometimes delay scheduled procedures.  2. Diet: Do not eat or drink anything after midnight prior to your procedure except sips of water to take medications.  3. Labs: You will need to have blood drawn on Tuesday, May 1 at [facility] at [facility]. [address]  Open: 7:30am - 5pm    Phone: [phone]. You do not need to be fasting.  4. Medication instructions in preparation for your procedure:  HOLD AM dose of Lasix and HCTZ morning of procedure.   On the morning of your procedure, take your Aspirin and any morning medicines NOT listed above.  You may use sips of water.  5. Plan for one night stay--bring personal belongings. 6. Bring a current list of your medications and current insurance cards. 7. You MUST have a responsible person to drive you home. 8. Someone MUST be with you the first 24 hours after you arrive home or your discharge will be delayed. 9. Please wear clothes that are easy to get on and off and wear slip-on shoes.  Thank you for allowing [place] to care for you!   -- [facility]   Follow-Up: Your physician recommends that you schedule a  follow-up appointment: After procedure with Dr. Fletcher Anon   Any Other Special Instructions Will Be Listed Below (If Applicable).     If you need a refill on your cardiac medications before your next appointment, please call your pharmacy.

## 2016-08-05 NOTE — Progress Notes (Signed)
Cardiology Office Note   Date:  08/05/2016   ID:  [name], DOB Oct 15, 1940, MRN [id number]  PCP:  [name], NP  Cardiologist: Dr. Radford Pax  Chief Complaint  Patient presents with  . New Evaluation    blood clot      History of Present Illness: [name] is a 76 y.o. male who was referred by Dr. Con Memos for evaluation of possible peripheral arterial disease. 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 He was not felt to be a good candidate for anticoagulation given history of heavy alcohol use. Echocardiogram showed normal LV systolic function. He had a rash with diltiazem and was subsequently switched to carvedilol. He developed a pressure ulcer on the left heel after his hospitalization in January which has been slow to heal. He has been going to the wound clinic on a weekly basis. He underwent noninvasive vascular evaluation in our office which showed an ABI of 0.52 on the left with significant left common and external iliac artery stenosis with velocity greater than 350 and an occluded left mid SFA. He quit alcohol drinking since his hospitalization in January . However, he continues to smoke about 10 cigarettes a day.   Past Medical History:  Diagnosis Date  . Alcohol abuse   . Aneurysm ([place])   . CKD (chronic kidney disease), stage III   . COPD (chronic obstructive pulmonary disease) ([place])   . Hyperchloremia   . Hypercholesteremia   . Hyperpotassemia   . Hypertension   . Hypertensive renal disease, benign   . Seizures ([place])    has not had a seizure in 15 yrs  . Tobacco use     Past Surgical History:  Procedure Laterality Date  . 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, 1996  . EYE SURGERY     December 2017     Current Outpatient Prescriptions  Medication Sig Dispense Refill  .  atorvastatin (LIPITOR) 10 MG tablet TAKE 1 TABLET (10 MG TOTAL) BY MOUTH DAILY. 90 tablet 1  . carvedilol (COREG) 6.25 MG tablet TAKE 1 TABLET (6.25 MG TOTAL) BY MOUTH 2 (TWO) TIMES DAILY WITH A MEAL. 60 tablet 0  . [facility] ARTIFICIAL TEARS 1-0.3 % SOLN PLACE 1 DROP IN EACHEYE EVERY 6 HOURS AS NEEDED FOR DRY EYES  0  . [facility] B-1 100 MG tablet TAKE 1 TABLET (100 MG TOTAL) BY MOUTH DAILY. 30 tablet 0  . divalproex (DEPAKOTE) 250 MG DR tablet TAKE 1 TABLET BY MOUTH 3 TIMES A DAY FOR SEIZURES 90 tablet 1  . furosemide (LASIX) 40 MG tablet TAKE 1 TABLET BY MOUTH ONCE DAILY FOR EDEMA 30 tablet 0  . hydrochlorothiazide (HYDRODIURIL) 25 MG tablet Take 25 mg by mouth daily.    Marland Kitchen levETIRAcetam (KEPPRA) 500 MG tablet Take one tablet by mouth twice daily 180 tablet 1  . LORazepam (ATIVAN) 0.5 MG tablet TAKE 1 TABLET BY MOUTH EVERY 12 HOURS 60 tablet 0  . Multiple Vitamins-Minerals (THEREMS-M) TABS Take 1 tablet by mouth daily. 90 tablet 3   No current facility-administered medications for this visit.     Allergies:   Diltiazem and Penicillins    Social History:  The patient  reports that he has quit smoking. He quit after 56.00 years of use. He has never used smokeless tobacco. He reports that he drinks alcohol. He reports that  he does not use drugs.   Family History:  The patient's family history includes Alzheimer's disease in his sister; Cancer in his mother; Clotting disorder in his sister and sister; Congestive Heart Failure in his brother, father, and sister.    ROS:  Please see the history of present illness.   Otherwise, review of systems are positive for none.   All other systems are reviewed and negative.    PHYSICAL EXAM: VS:  BP (!) 154/82   Pulse 64   Ht 5\' 6"  (1.676 m)   Wt 123 lb 3.2 oz (55.9 kg)   BMI 19.89 kg/m  , BMI Body mass index is 19.89 kg/m. GEN: Well nourished, well developed, in no acute distress  HEENT: normal  Neck: no JVD, carotid bruits, or masses Cardiac: RRR; no  murmurs, rubs, or gallops,no edema  Respiratory:  clear to auscultation bilaterally, normal work of breathing GI: soft, nontender, nondistended, + BS MS: no deformity or atrophy  Skin: warm and dry, no rash Neuro:  Strength and sensation are intact Psych: euthymic mood, full affect Vascular: Femoral pulses +1 bilaterally with bruits. Distal pulses are not palpable. There is small superficial ulceration affecting the left heel  EKG:  EKG is not ordered today.    Recent Labs: 04/14/2016: B Natriuretic Peptide 1,172.2; Magnesium 1.9; TSH 1.992 06/19/2016: ALT 6; BUN 16; Creat 1.10; Hemoglobin 9.5; Platelets 218; Potassium 4.5; Sodium 144    Lipid Panel    Component Value Date/Time   CHOL 164 04/15/2016 0211   CHOL 206 (H) 07/13/2014 1130   TRIG 48 04/15/2016 0211   HDL 103 04/15/2016 0211   HDL 112 07/13/2014 1130   CHOLHDL 1.6 04/15/2016 0211   VLDL 10 04/15/2016 0211   LDLCALC 51 04/15/2016 0211   LDLCALC 83 07/13/2014 1130      Wt Readings from Last 3 Encounters:  08/05/16 123 lb 3.2 oz (55.9 kg)  07/08/16 127 lb (57.6 kg)  06/19/16 126 lb 6.4 oz (57.3 kg)       No flowsheet data found.    ASSESSMENT AND PLAN:  1.  Peripheral arterial disease: The patient has nonhealing pressure ulcer affecting the left heel. He has multilevel disease including iliac and SFA and I suspect that this ulceration might not heal completely without improving the blood flow. I discussed this in details with him and recommend proceeding with abdominal aortogram, lower extremity runoff and possible endovascular intervention. I discussed the risks and benefits. Start aspirin 81 mg once daily. Planned access is via the right common femoral artery.  2. Hyperlipidemia: Continue treatment with atorvastatin.  3. Tobacco use: He slowed down on smoking but has not been able to quit.  4. Paroxysmal atrial fibrillation: Currently in regular rhythm. Atrial fibrillation was in the setting of excessive  alcohol use. If he develops recurrent atrial fibrillation, anticoagulation should be considered given that he stop drinking alcohol.   Disposition:   FU with me in 1 month  Signed,  Kathlyn Sacramento, MD  08/05/2016 10:30 AM    [place]

## 2016-08-06 ENCOUNTER — Other Ambulatory Visit: Payer: Self-pay | Admitting: Internal Medicine

## 2016-08-07 ENCOUNTER — Other Ambulatory Visit: Payer: Medicare Other | Admitting: *Deleted

## 2016-08-07 ENCOUNTER — Encounter: Payer: Medicare Other | Attending: Surgery | Admitting: Surgery

## 2016-08-07 DIAGNOSIS — F17218 Nicotine dependence, cigarettes, with other nicotine-induced disorders: Secondary | ICD-10-CM | POA: Insufficient documentation

## 2016-08-07 DIAGNOSIS — L89620 Pressure ulcer of left heel, unstageable: Secondary | ICD-10-CM | POA: Diagnosis not present

## 2016-08-07 DIAGNOSIS — G40909 Epilepsy, unspecified, not intractable, without status epilepticus: Secondary | ICD-10-CM | POA: Insufficient documentation

## 2016-08-07 DIAGNOSIS — N183 Chronic kidney disease, stage 3 (moderate): Secondary | ICD-10-CM | POA: Diagnosis not present

## 2016-08-07 DIAGNOSIS — J449 Chronic obstructive pulmonary disease, unspecified: Secondary | ICD-10-CM | POA: Insufficient documentation

## 2016-08-07 DIAGNOSIS — I4891 Unspecified atrial fibrillation: Secondary | ICD-10-CM

## 2016-08-07 DIAGNOSIS — Z79899 Other long term (current) drug therapy: Secondary | ICD-10-CM | POA: Diagnosis not present

## 2016-08-07 DIAGNOSIS — I70245 Atherosclerosis of native arteries of left leg with ulceration of other part of foot: Secondary | ICD-10-CM | POA: Diagnosis present

## 2016-08-07 DIAGNOSIS — I129 Hypertensive chronic kidney disease with stage 1 through stage 4 chronic kidney disease, or unspecified chronic kidney disease: Secondary | ICD-10-CM | POA: Insufficient documentation

## 2016-08-07 DIAGNOSIS — I10 Essential (primary) hypertension: Secondary | ICD-10-CM

## 2016-08-07 DIAGNOSIS — Z880 Allergy status to penicillin: Secondary | ICD-10-CM | POA: Diagnosis not present

## 2016-08-07 DIAGNOSIS — I130 Hypertensive heart and chronic kidney disease with heart failure and stage 1 through stage 4 chronic kidney disease, or unspecified chronic kidney disease: Secondary | ICD-10-CM | POA: Diagnosis not present

## 2016-08-07 DIAGNOSIS — I5032 Chronic diastolic (congestive) heart failure: Secondary | ICD-10-CM | POA: Diagnosis not present

## 2016-08-07 DIAGNOSIS — E878 Other disorders of electrolyte and fluid balance, not elsewhere classified: Secondary | ICD-10-CM | POA: Diagnosis not present

## 2016-08-07 DIAGNOSIS — L89624 Pressure ulcer of left heel, stage 4: Secondary | ICD-10-CM | POA: Diagnosis not present

## 2016-08-07 DIAGNOSIS — R261 Paralytic gait: Secondary | ICD-10-CM | POA: Diagnosis not present

## 2016-08-07 DIAGNOSIS — D631 Anemia in chronic kidney disease: Secondary | ICD-10-CM | POA: Insufficient documentation

## 2016-08-07 DIAGNOSIS — F0390 Unspecified dementia without behavioral disturbance: Secondary | ICD-10-CM | POA: Diagnosis not present

## 2016-08-07 LAB — CBC WITH DIFFERENTIAL/PLATELET
BASOS ABS: 0 10*3/uL (ref 0.0–0.2)
Basos: 0 %
EOS (ABSOLUTE): 0.1 10*3/uL (ref 0.0–0.4)
Eos: 1 %
HEMATOCRIT: 35.3 % — AB (ref 37.5–51.0)
HEMOGLOBIN: 11.9 g/dL — AB (ref 13.0–17.7)
LYMPHS: 53 %
Lymphocytes Absolute: 2.7 10*3/uL (ref 0.7–3.1)
MCH: 28.2 pg (ref 26.6–33.0)
MCHC: 33.7 g/dL (ref 31.5–35.7)
MCV: 84 fL (ref 79–97)
Monocytes Absolute: 0.5 10*3/uL (ref 0.1–0.9)
Monocytes: 10 %
NEUTROS PCT: 36 %
Neutrophils Absolute: 1.8 10*3/uL (ref 1.4–7.0)
Platelets: 189 10*3/uL (ref 150–379)
RBC: 4.22 x10E6/uL (ref 4.14–5.80)
RDW: 15.3 % (ref 12.3–15.4)
WBC: 5.1 10*3/uL (ref 3.4–10.8)

## 2016-08-07 LAB — PROTIME-INR
INR: 0.9 (ref 0.8–1.2)
PROTHROMBIN TIME: 9.6 s (ref 9.1–12.0)

## 2016-08-07 LAB — BASIC METABOLIC PANEL
BUN/Creatinine Ratio: 18 (ref 10–24)
BUN: 25 mg/dL (ref 8–27)
CALCIUM: 9.8 mg/dL (ref 8.6–10.2)
CHLORIDE: 104 mmol/L (ref 96–106)
CO2: 32 mmol/L — AB (ref 18–29)
Creatinine, Ser: 1.4 mg/dL — ABNORMAL HIGH (ref 0.76–1.27)
GFR calc Af Amer: 56 mL/min/{1.73_m2} — ABNORMAL LOW (ref >59)
GFR calc non Af Amer: 48 mL/min/{1.73_m2} — ABNORMAL LOW (ref >59)
Glucose: 93 mg/dL (ref 65–99)
POTASSIUM: 4.7 mmol/L (ref 3.5–5.2)
Sodium: 138 mmol/L (ref 134–144)

## 2016-08-08 ENCOUNTER — Other Ambulatory Visit: Payer: Self-pay | Admitting: Nurse Practitioner

## 2016-08-08 NOTE — Progress Notes (Addendum)
STRYDER, POITRA ([id number]) Visit Report for 08/07/2016 Chief Complaint Document Details Patient Name: [name] Date of Service: 08/07/2016 9:45 AM Medical Record Number: [id number] Patient Account Number: [id number] Date of Birth/Sex: 1940-04-19 (76 y.o. Male) Treating RN: [name] Primary Care Provider: Sherrie [name] Other Clinician: Referring Provider: Sherrie [name] Treating Provider/Extender: [name] in Treatment: 6 Information Obtained from: Patient Chief Complaint Patient is at the clinic for treatment of an open pressure ulcer the left heel which she's had for about 2 months Electronic Signature(s) Signed: 08/07/2016 10:43:14 AM By: Jeremy Fudge MD, FACS Entered By: [name] on 08/07/2016 10:43:14 [name], [name] ([id number]) -------------------------------------------------------------------------------- Debridement Details Patient Name: [name] Date of Service: 08/07/2016 9:45 AM Medical Record Number: [id number] Patient Account Number: [id number] Date of Birth/Sex: May 23, 1940 (76 y.o. Male) Treating RN: [name] Primary Care Provider: Sherrie [name] Other Clinician: Referring Provider: Sherrie [name] Treating Provider/Extender: [name] in Treatment: 6 Debridement Performed for Wound #1 Left Calcaneus Assessment: Performed By: Physician Jeremy Fudge, MD Debridement: Debridement Pre-procedure Yes - 10:20 Verification/Time Out Taken: Start Time: 10:21 Pain Control: Lidocaine 4% Topical Solution Level: Skin/Subcutaneous Tissue Total Area Debrided (L x 0.6 (cm) x 1.3 (cm) = 0.78 (cm) W): Tissue and other Viable, Non-Viable, Exudate, Fibrin/Slough, Subcutaneous material debrided: Instrument: Curette Bleeding: Minimum Hemostasis Achieved: Pressure End Time: 10:29 Procedural Pain: 0 Post Procedural Pain: 0 Response to Treatment: Procedure was tolerated well Post Debridement Measurements of Total  Wound Length: (cm) 0.6 Stage: Category/Stage III Width: (cm) 1.3 Depth: (cm) 0.3 Volume: (cm) 0.184 Character of Wound/Ulcer Post Requires Further Debridement: Debridement Severity of Tissue Post Fat layer exposed Debridement: Post Procedure Diagnosis Same as Pre-procedure Electronic Signature(s) Signed: 08/07/2016 10:43:08 AM By: Jeremy Fudge MD, FACS Signed: 08/07/2016 4:23:06 PM By: [name] Entered By: [name] on 08/07/2016 10:43:08 [name], [name] ([id number]) [name], [name] ([id number]) -------------------------------------------------------------------------------- HPI Details Patient Name: [name], [name] Date of Service: 08/07/2016 9:45 AM Medical Record Number: [id number] Patient Account Number: [id number] Date of Birth/Sex: [birth year] (76 y.o. Male) Treating RN: [name] Primary Care Provider: Sherrie [name] Other Clinician: Referring Provider: Sherrie [name] Treating Provider/Extender: [name] in Treatment: 6 History of Present Illness Location: left heel ulceration Quality: Patient reports experiencing a dull pain to affected area(s). Severity: Patient states wound are getting worse. Duration: Patient has had the wound for > 2 months prior to seeking treatment at the wound center Timing: Pain in wound is Intermittent (comes and goes Context: The wound appeared gradually over time Modifying Factors: Other treatment(s) tried include:local care and offloading Associated Signs and Symptoms: Patient reports having difficulty standing for long periods. HPI Description: 76 year old gentleman who is a reformed alcoholic for about 6 months now was referred to as by his PCP team for a left heel ulceration which she's had for about 2 months. He has a history of alcohol abuse, aneurysm, hypertensive renal disease, anemia of chronic kidney disease, atrial fibrillation with RVR, COPD, hypertension, seizure disorders, tobacco abuse, cerebral  aneurysm repair in 1997 and some ice surgery. He continues to smoke about half a pack of cigarettes a day. the patient is not fully competent and has a part of attorney along with him today. 07/03/2016 -- x-ray of the left heel shows a soft tissue wound with no bony destruction or concerns for osteomyelitis. 07/10/2016 -- arterial studies are still pending. 07/30/2016 -- lower arterial examination done shows a right ABI of 0.85 and a left ABI of 0.59. 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 Bilateral CFA stenosis. Occluded left mid distal SFA. Three-vessel runoff bilaterally. The bilateral great toe pressures were also abnormal in the 10 digit toe PPGos are abnormal. PV consult suggested -- to see [name] appointment scheduled for this coming Tuesday 08/07/2016 -- was seen in the office by Dr. Annia [name]. He reviewed his arterial studies with multilevel disease including iliac and SFA and recommended an abdominal aortogram, lower extremity runoff and possible endovascular intervention. Electronic Signature(s) Signed: 08/07/2016 10:43:21 AM By: Jeremy Fudge MD, FACS Previous Signature: 08/07/2016 10:31:34 AM Version By: Jeremy Fudge MD, FACS Previous Signature: 08/07/2016 10:16:08 AM Version By: Jeremy Fudge MD, FACS Entered By: [name] on 08/07/2016 10:43:21 [name], [name] Kitchen ([id number]) -------------------------------------------------------------------------------- Physical Exam Details Patient Name: [name] Date of Service: 08/07/2016 9:45 AM Medical Record Number: [id number] Patient Account Number: [id number] Date of Birth/Sex: 11-13-1940 (76 y.o. Male) Treating RN: [name] Primary Care Provider: Sherrie [name] Other Clinician: Referring Provider: Sherrie [name] Treating Provider/Extender: [name] in Treatment: 6 Constitutional .  Pulse regular. Respirations normal and unlabored. Afebrile. . Eyes Nonicteric. Reactive to light. Ears, Nose, Mouth, and Throat Lips, teeth, and gums WNL.Marland Kitchen Moist mucosa without lesions. Neck supple and nontender. No palpable supraclavicular or cervical adenopathy. Normal sized without goiter. Respiratory WNL. No retractions.. Cardiovascular Pedal Pulses WNL. No clubbing, cyanosis or edema. Lymphatic No adneopathy. No adenopathy. No adenopathy. Musculoskeletal Adexa without tenderness or enlargement.. Digits and nails w/o clubbing, cyanosis, infection, petechiae, ischemia, or inflammatory conditions.. Integumentary (Hair, Skin) No suspicious lesions. No crepitus or fluctuance. No peri-wound warmth or erythema. No masses.Marland Kitchen Psychiatric Judgement and insight Intact.. No evidence of depression, anxiety, or agitation.. Notes the wound needed a lot of sharp debridement of eschar and subcutaneous debris and under this there is a healthy area which has good granulation tissue. Minimal bleeding controlled with pressure. Electronic Signature(s) Signed: 08/07/2016 10:44:03 AM By: Jeremy Fudge MD, FACS Entered By: [name] on 08/07/2016 10:44:02 [name] ([id number]) -------------------------------------------------------------------------------- Physician Orders Details Patient Name: [name] Date of Service: 08/07/2016 9:45 AM Medical Record Number: [id number] Patient Account Number: [id number] Date of Birth/Sex: [birth year] (76 y.o. Male) Treating RN: [name] Primary Care Provider: Sherrie [name] Other Clinician: Referring Provider: Sherrie [name] Treating Provider/Extender: [name] in Treatment: 6 Verbal / Phone Orders: Yes Clinician: Carolyne Fiscal, Debi Read Back and Verified: Yes Diagnosis Coding Wound Cleansing Wound #1 Left Calcaneus o Clean wound with Normal Saline. Anesthetic Wound #1 Left Calcaneus o Topical Lidocaine 4% cream applied to  wound bed prior to debridement - clinic use Primary Wound Dressing Wound #1 Left Calcaneus o Prisma Ag - moisten with saline Secondary Dressing Wound #1 Left Calcaneus o Dry Gauze o Conform/Kerlix o Other - allevyn heel cup, tape Dressing Change Frequency Wound #1 Left Calcaneus o Change dressing every other day. Follow-up Appointments Wound #1 Left Calcaneus o Return Appointment in 1 week. Off-Loading Wound #1 Left Calcaneus o Turn and reposition every 2 hours o Other: - keep pressure off the heel Additional Orders / Instructions Wound #1 Left Calcaneus o Stop Smoking o Increase protein intake. [name], [name] ([id number]) Home Health Wound #1 Left [place] Nurse may visit PRN to address patientos wound care needs. o FACE TO FACE ENCOUNTER: MEDICARE and MEDICAID PATIENTS: I certify that this patient is under my  care and that I had a face-to-face encounter that meets the physician face-to-face encounter requirements with this patient on this date. The encounter with the patient was in whole or in part for the following MEDICAL CONDITION: (primary reason for [place]) MEDICAL NECESSITY: I certify, that based on my findings, NURSING services are a medically necessary home health service. HOME BOUND STATUS: I certify that my clinical findings support that this patient is homebound (i.e., Due to illness or injury, pt requires aid of supportive devices such as crutches, cane, wheelchairs, walkers, the use of special transportation or the assistance of another person to leave their place of residence. There is a normal inability to leave the home and doing so requires considerable and taxing effort. Other absences are for medical reasons / religious services and are infrequent or of short duration when for other reasons). o If current dressing causes regression in wound condition, may [name]/C  ordered dressing product/s and apply Normal Saline Moist Dressing daily until next [place] / Other MD appointment. [place] of regression in wound condition at [phone]. o Please direct any NON-WOUND related issues/requests for orders to patient's Primary Care Physician Medications-please add to medication list. Wound #1 Left Calcaneus o Other: - vitamin c, vitamin a, zinc, MVI Electronic Signature(s) Signed: 08/07/2016 3:54:55 PM By: Jeremy Fudge MD, FACS Signed: 08/07/2016 4:23:06 PM By: [name] Entered By: [name] on 08/07/2016 10:34:23 [name], [name] ([id number]) -------------------------------------------------------------------------------- Problem List Details Patient Name: AUBURN, HERT [name]. Date of Service: 08/07/2016 9:45 AM Medical Record Number: [id number] Patient Account Number: [id number] Date of Birth/Sex: [birth year] (76 y.o. Male) Treating RN: [name] Primary Care Provider: Sherrie [name] Other Clinician: Referring Provider: Sherrie [name] Treating Provider/Extender: [name] in Treatment: 6 Active Problems ICD-10 Encounter Code Description Active Date Diagnosis [phone] Pressure ulcer of left heel, stage 4 06/26/2016 Yes F17.218 Nicotine dependence, cigarettes, with other nicotine- 06/26/2016 Yes induced disorders I70.245 Atherosclerosis of native arteries of left leg with ulceration 07/31/2016 Yes of other part of foot Inactive Problems Resolved Problems Electronic Signature(s) Signed: 08/07/2016 10:42:38 AM By: Jeremy Fudge MD, FACS Entered By: [name] on 08/07/2016 10:42:37 [name], [name] ([id number]) -------------------------------------------------------------------------------- Progress Note Details Patient Name: [name] Date of Service: 08/07/2016 9:45 AM Medical Record Number: [id number] Patient Account Number: [id number] Date of Birth/Sex: Nov 02, 1940 (76 y.o. Male) Treating  RN: [name] Primary Care Provider: Sherrie [name] Other Clinician: Referring Provider: Sherrie [name] Treating Provider/Extender: [name] in Treatment: 6 Subjective Chief Complaint Information obtained from Patient Patient is at the clinic for treatment of an open pressure ulcer the left heel which she's had for about 2 months History of Present Illness (HPI) The following HPI elements were documented for the patient's wound: Location: left heel ulceration Quality: Patient reports experiencing a dull pain to affected area(s). Severity: Patient states wound are getting worse. Duration: Patient has had the wound for > 2 months prior to seeking treatment at the wound center Timing: Pain in wound is Intermittent (comes and goes Context: The wound appeared gradually over time Modifying Factors: Other treatment(s) tried include:local care and offloading Associated Signs and Symptoms: Patient reports having difficulty standing for long periods. 76 year old gentleman who is a reformed alcoholic for about 6 months now was referred to as by his PCP team for a left heel ulceration which she's had for about 2 months. He has a history of alcohol abuse, aneurysm, hypertensive renal disease, anemia of chronic kidney disease, atrial fibrillation  with RVR, COPD, hypertension, seizure disorders, tobacco abuse, cerebral aneurysm repair in 1997 and some ice surgery. He continues to smoke about half a pack of cigarettes a day. the patient is not fully competent and has a part of attorney along with him today. 07/03/2016 -- x-ray of the left heel shows a soft tissue wound with no bony destruction or concerns for osteomyelitis. 07/10/2016 -- arterial studies are still pending. 07/30/2016 -- lower arterial examination done shows a right ABI of 0.85 and a left ABI of 0.59. 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 Bilateral CFA stenosis. Occluded left mid distal SFA. Three-vessel runoff bilaterally. The bilateral great toe pressures were also abnormal in the 10 digit toe PPG s are abnormal. PV consult suggested -- to see [name] appointment scheduled for this coming Tuesday 08/07/2016 -- was seen in the office by Dr. Annia [name]. He reviewed his arterial studies with multilevel disease including iliac and SFA and recommended an abdominal aortogram, lower extremity runoff and possible endovascular intervention. [name], [name] ([id number]) Objective Constitutional Pulse regular. Respirations normal and unlabored. Afebrile. Vitals Time Taken: 9:54 AM, Weight: 128.8 lbs, Pulse: 50 bpm, Respiratory Rate: 18 breaths/min, Blood Pressure: 169/58 mmHg. Eyes Nonicteric. Reactive to light. Ears, Nose, Mouth, and Throat Lips, teeth, and gums WNL.Marland Kitchen Moist mucosa without lesions. Neck supple and nontender. No palpable supraclavicular or cervical adenopathy. Normal sized without goiter. Respiratory WNL. No retractions.. Cardiovascular Pedal Pulses WNL. No clubbing, cyanosis or edema. Lymphatic No adneopathy. No adenopathy. No adenopathy. Musculoskeletal Adexa without tenderness or enlargement.. Digits and nails w/o clubbing, cyanosis, infection, petechiae, ischemia, or inflammatory conditions.Marland Kitchen Psychiatric Judgement and insight Intact.. No evidence of depression, anxiety, or agitation.. General Notes: the wound needed a lot of sharp debridement of eschar and subcutaneous debris and under this there is a healthy area which has good granulation tissue. Minimal bleeding controlled with pressure. Integumentary (Hair, Skin) No suspicious lesions. No crepitus or fluctuance. No peri-wound warmth or erythema. No masses.. Wound #1 status is Open. Original cause of wound was Pressure Injury. The wound is located on the Left Calcaneus. The wound measures 0.2cm  length x 0.5cm width x 0.1cm depth; 0.079cm^2 area and [name], [name] ([id number]) 0.008cm^3 volume. There is no tunneling or undermining noted. There is a large amount of serous drainage noted. The wound margin is distinct with the outline attached to the wound base. There is medium (34-66%) red granulation within the wound bed. There is a medium (34-66%) amount of necrotic tissue within the wound bed including Adherent Slough. Periwound temperature was noted as No Abnormality. The periwound has tenderness on palpation. Assessment Active Problems ICD-10 L89.624 - Pressure ulcer of left heel, stage 4 F17.218 - Nicotine dependence, cigarettes, with other nicotine-induced disorders I70.245 - Atherosclerosis of native arteries of left leg with ulceration of other part of foot Procedures Wound #1 Wound #1 is a Pressure Ulcer located on the Left Calcaneus . There was a Skin/Subcutaneous Tissue Debridement ([id number]) debridement with total area of 0.78 sq cm performed by Jeremy Fudge, MD. with the following instrument(s): Curette to remove Viable and Non-Viable tissue/material including Exudate, Fibrin/Slough, and Subcutaneous after achieving pain control using Lidocaine 4% Topical Solution. A time out was conducted at 10:20, prior to the start of the procedure. A Minimum amount of bleeding was controlled with Pressure. The procedure was tolerated well with a pain level of 0 throughout and  a pain level of 0 following the procedure. Post Debridement Measurements: 0.6cm length x 1.3cm width x 0.3cm depth; 0.184cm^3 volume. Post debridement Stage noted as Category/Stage III. Character of Wound/Ulcer Post Debridement requires further debridement. Severity of Tissue Post Debridement is: Fat layer exposed. Post procedure Diagnosis Wound #1: Same as Pre-Procedure Plan Wound Cleansing: Wound #1 Left Calcaneus: Clean wound with Normal Saline. Anesthetic: [name], [name] ([id number]) Wound #1 Left  Calcaneus: Topical Lidocaine 4% cream applied to wound bed prior to debridement - clinic use Primary Wound Dressing: Wound #1 Left Calcaneus: Prisma Ag - moisten with saline Secondary Dressing: Wound #1 Left Calcaneus: Dry Gauze Conform/Kerlix Other - allevyn heel cup, tape Dressing Change Frequency: Wound #1 Left Calcaneus: Change dressing every other day. Follow-up Appointments: Wound #1 Left Calcaneus: Return Appointment in 1 week. Off-Loading: Wound #1 Left Calcaneus: Turn and reposition every 2 hours Other: - keep pressure off the heel Additional Orders / Instructions: Wound #1 Left Calcaneus: Stop Smoking Increase protein intake. Home Health: Wound #1 Left Calcaneus: Continue Home Health Visits - [facility] Nurse may visit PRN to address patient s wound care needs. FACE TO FACE ENCOUNTER: MEDICARE and MEDICAID PATIENTS: I certify that this patient is under my care and that I had a face-to-face encounter that meets the physician face-to-face encounter requirements with this patient on this date. The encounter with the patient was in whole or in part for the following MEDICAL CONDITION: (primary reason for [place]) MEDICAL NECESSITY: I certify, that based on my findings, NURSING services are a medically necessary home health service. HOME BOUND STATUS: I certify that my clinical findings support that this patient is homebound (i.e., Due to illness or injury, pt requires aid of supportive devices such as crutches, cane, wheelchairs, walkers, the use of special transportation or the assistance of another person to leave their place of residence. There is a normal inability to leave the home and doing so requires considerable and taxing effort. Other absences are for medical reasons / religious services and are infrequent or of short duration when for other reasons). If current dressing causes regression in wound condition, may [name]/C ordered dressing product/s  and apply Normal Saline Moist Dressing daily until next [place] / Other MD appointment. [place] of regression in wound condition at [phone]. Please direct any NON-WOUND related issues/requests for orders to patient's Primary Care Physician Medications-please add to medication list.: Wound #1 Left Calcaneus: Other: - vitamin c, vitamin a, zinc, MVI [name], [name] ([id number]) After review I have recommended: 1. Prisma AG to be applied daily and covered with a heel cup 2. Offloading has been discussed in great detail 3. A posterior offloading shoe has been provided 4. Arterial duplex study of the left lower extremity -- appointment to see the interventional cardiologist for a procedure which has been scheduled next week. details of this have been noted by me 5. regular visits the wound center The power of attorney has been very compliant and all questions have been answered. Electronic Signature(s) Signed: 08/07/2016 10:45:09 AM By: Jeremy Fudge MD, FACS Entered By: [name] on 08/07/2016 10:45:08 ZEREK, LITSEY [name]. ([id number]) -------------------------------------------------------------------------------- SuperBill Details Patient Name: [name] Date of Service: 08/07/2016 Medical Record Number: [id number] Patient Account Number: [id number] Date of Birth/Sex: 1940/06/02 (76 y.o. Male) Treating RN: [name] Primary Care Provider: Sherrie [name] Other Clinician: Referring Provider: Sherrie [name] Treating Provider/Extender: [name] in Treatment: 6 Diagnosis Coding ICD-10 Codes Code Description [phone]  Pressure ulcer of left heel, stage 4 F17.218 Nicotine dependence, cigarettes, with other nicotine-induced disorders I70.245 Atherosclerosis of native arteries of left leg with ulceration of other part of foot Facility Procedures CPT4: Description Modifier Quantity Code [id number] 11042 - DEB SUBQ TISSUE 20 SQ CM/< 1 ICD-10  Description Diagnosis 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, stage 4 F17.218 Nicotine dependence, cigarettes, with other nicotine-induced disorders I70.245  Atherosclerosis of native arteries of left leg with ulceration of other part of foot Physician Procedures CPT4: Description Modifier Quantity Code [id number] 82883 - WC PHYS LEVEL 3 - EST PT 25 1 ICD-10 Description Diagnosis 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, stage 4 F17.218 Nicotine dependence, cigarettes, with other nicotine-induced disorders I70.245  Atherosclerosis of native arteries of left leg with ulceration of other part of foot CPT4: [id number] 11042 - WC PHYS SUBQ TISS 20 SQ CM 1 ICD-10 Description Diagnosis 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, stage 4 F17.218 Nicotine dependence, cigarettes, with other nicotine-induced disorders I70.245 Atherosclerosis of native arteries of left  leg with ulceration of other part of foot TAJEE, SAVANT ([id number]) Electronic Signature(s) Signed: 08/07/2016 10:45:30 AM By: Jeremy Fudge MD, FACS Entered By: [name] on 08/07/2016 10:45:29

## 2016-08-09 NOTE — Progress Notes (Addendum)
ALMIR, BOTTS ([id number]) Visit Report for 08/07/2016 Arrival Information Details Patient Name: [name], [name] Date of Service: 08/07/2016 9:45 AM Medical Record Number: [id number] Patient Account Number: [id number] Date of Birth/Sex: 11/25/1940 (76 y.o. Male) Treating RN: Ahmed Prima Primary Care Zada Haser: [name] Other Clinician: Referring Xaria Judon: [name] Treating Triston Skare/Extender: Frann Rider in Treatment: 6 Visit Information History Since Last Visit All ordered tests and consults were completed: No Patient Arrived: Ambulatory Added or deleted any medications: No Arrival Time: 09:52 Any new allergies or adverse reactions: No Accompanied By: caregiver Had a fall or experienced change in No Transfer Assistance: None activities of daily living that may affect Patient Identification Verified: Yes risk of falls: Secondary Verification Process Yes Signs or symptoms of abuse/neglect since last No Completed: visito Patient Requires Transmission-Based No Hospitalized since last visit: No Precautions: Has Dressing in Place as Prescribed: Yes Patient Has Alerts: No Pain Present Now: No Electronic Signature(s) Signed: 08/07/2016 4:23:06 PM By: Alric Quan Entered By: Alric Quan on 08/07/2016 09:54:07 Mcallister, Christerpher D. ([id number]) -------------------------------------------------------------------------------- Encounter Discharge Information Details Patient Name: Jeremy Gang D. Date of Service: 08/07/2016 9:45 AM Medical Record Number: [id number] Patient Account Number: [id number] Date of Birth/Sex: 04/03/1941 (76 y.o. Male) Treating RN: Ahmed Prima Primary Care Hazely Sealey: [name] Other Clinician: Referring Erlinda Solinger: [name] Treating Kaniah Rizzolo/Extender: Frann Rider in Treatment: 6 Encounter Discharge Information Items Discharge Pain Level: 0 Discharge Condition: Stable Ambulatory Status: Ambulatory Discharge  Destination: Home Transportation: Private Auto Accompanied By: caregiver Schedule Follow-up Appointment: Yes Medication Reconciliation completed and provided to Patient/Care No Sydny Schnitzler: Provided on Clinical Summary of Care: 08/07/2016 Form Type Recipient Paper Patient EM Electronic Signature(s) Signed: 08/07/2016 10:34:56 AM By: Ruthine Dose Entered By: Ruthine Dose on 08/07/2016 10:34:56 Stalvey, Ryszard D. ([id number]) -------------------------------------------------------------------------------- Multi Wound Chart Details Patient Name: Jeremy Gang D. Date of Service: 08/07/2016 9:45 AM Medical Record Number: [id number] Patient Account Number: [id number] Date of Birth/Sex: 09/09/1940 (76 y.o. Male) Treating RN: Ahmed Prima Primary Care Theodoros Stjames: [name] Other Clinician: Referring Harshil Cavallaro: [name] Treating Grizelda Piscopo/Extender: Frann Rider in Treatment: 6 Vital Signs Height(in): Pulse(bpm): 50 Weight(lbs): 128.8 Blood Pressure 169/58 (mmHg): Body Mass Index(BMI): Temperature(F): Respiratory Rate 18 (breaths/min): Photos: [1:No Photos] [N/A:N/A] Wound Location: [1:Left Calcaneus] [N/A:N/A] Wounding Event: [1:Pressure Injury] [N/A:N/A] Primary Etiology: [1:Pressure Ulcer] [N/A:N/A] Comorbid History: [1:Chronic Obstructive Pulmonary Disease (COPD), Arrhythmia, Hypertension, Seizure Disorder] [N/A:N/A] Date Acquired: [1:05/08/2016] [N/A:N/A] Weeks of Treatment: [1:6] [N/A:N/A] Wound Status: [1:Open] [N/A:N/A] Measurements L x W x D 0.2x0.5x0.1 [N/A:N/A] (cm) Area (cm) : [1:0.079] [N/A:N/A] Volume (cm) : [1:0.008] [N/A:N/A] % Reduction in Area: [1:99.00%] [N/A:N/A] % Reduction in Volume: 99.70% [N/A:N/A] 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] [N/A:N/A] Exudate Amount: [1:Large] [N/A:N/A] Exudate Type: [1:Serous] [N/A:N/A] Exudate Color: [1:amber] [N/A:N/A] Foul Odor After [1:Yes] [N/A:N/A] Cleansing: Odor Anticipated Due to No  [N/A:N/A] Product Use: Wound Margin: [1:Distinct, outline attached] [N/A:N/A] Granulation Amount: [1:Medium (34-66%)] [N/A:N/A] Granulation Quality: [1:Red] [N/A:N/A] Necrotic Amount: [1:Medium (34-66%)] [N/A:N/A] Epithelialization: Small (1-33%) N/A N/A Debridement: Debridement (89381- N/A N/A 11047) Pre-procedure 10:20 N/A N/A Verification/Time Out Taken: Pain Control: Lidocaine 4% Topical N/A N/A Solution Tissue Debrided: Fibrin/Slough, Exudates, N/A N/A Subcutaneous Level: Skin/Subcutaneous N/A N/A Tissue Debridement Area (sq 0.1 N/A N/A cm): Instrument: Curette N/A N/A Bleeding: Minimum N/A N/A Hemostasis Achieved: Pressure N/A N/A Procedural Pain: 0 N/A N/A Post Procedural Pain: 0 N/A N/A Debridement Treatment Procedure was tolerated N/A N/A Response: well Post Debridement 0.6x1.3x0.3 N/A N/A Measurements L x W x D (cm) Post Debridement 0.184 N/A N/A Volume: (cm)  Post Debridement Category/Stage III N/A N/A Stage: Periwound Skin Texture: No Abnormalities Noted N/A N/A Periwound Skin No Abnormalities Noted N/A N/A Moisture: Periwound Skin Color: No Abnormalities Noted N/A N/A Temperature: No Abnormality N/A N/A Tenderness on Yes N/A N/A Palpation: Wound Preparation: Ulcer Cleansing: N/A N/A Rinsed/Irrigated with Saline Topical Anesthetic Applied: Other: lidocaine 4% Procedures Performed: Debridement N/A N/A Treatment Notes Wound #1 (Left Calcaneus) 1. Cleansed with: Clean wound with Normal Saline 2. Anesthetic Cappella, Sylar D. ([id number]) Topical Lidocaine 4% cream to wound bed prior to debridement 4. Dressing Applied: Prisma Ag 5. Secondary Dressing Applied Dry Gauze Kerlix/Conform 7. Secured with Tape Notes heel cup Electronic Signature(s) Signed: 08/07/2016 10:42:43 AM By: Christin Fudge MD, FACS Entered By: Christin Fudge on 08/07/2016 10:42:42 MENASHE, KAFER DMarland Kitchen  ([id number]) -------------------------------------------------------------------------------- [place] Details Patient Name: [name], [name] Date of Service: 08/07/2016 9:45 AM Medical Record Number: [id number] Patient Account Number: [id number] Date of Birth/Sex: 05/26/1940 (76 y.o. Male) Treating RN: Ahmed Prima Primary Care Shaindy Reader: [name] Other Clinician: Referring Amahia Madonia: [name] Treating Theressa Piedra/Extender: Frann Rider in Treatment: 6 Active Inactive Electronic Signature(s) Signed: 08/27/2016 11:41:06 AM By: Gretta Cool, BSN, RN, CWS, Kim RN, BSN Signed: 08/28/2016 4:08:56 PM By: Alric Quan Previous Signature: 08/07/2016 4:23:06 PM Version By: Alric Quan Entered By: Gretta Cool BSN, RN, CWS, Kim on 08/27/2016 11:41:05 BLANTON, KARDELL D. ([id number]) -------------------------------------------------------------------------------- Pain Assessment Details Patient Name: ALFREDO, SPONG D. Date of Service: 08/07/2016 9:45 AM Medical Record Number: [id number] Patient Account Number: [id number] Date of Birth/Sex: Sep 18, 1940 (76 y.o. Male) Treating RN: Ahmed Prima Primary Care Solash Tullo: [name] Other Clinician: Referring Diamonds Lippard: [name] Treating Mahogani Holohan/Extender: Frann Rider in Treatment: 6 Active Problems Location of Pain Severity and Description of Pain Patient Has Paino No Site Locations With Dressing Change: No Pain Management and Medication Current Pain Management: Electronic Signature(s) Signed: 08/07/2016 4:23:06 PM By: Alric Quan Entered By: Alric Quan on 08/07/2016 09:54:14 Langhans, Harjot D. ([id number]) -------------------------------------------------------------------------------- Patient/Caregiver Education Details Patient Name: Jeremy Gang D. Date of Service: 08/07/2016 9:45 AM Medical Record Number: [id number] Patient Account Number: [id number] Date of Birth/Gender: 08/01/1940 (76 y.o.  Male) Treating RN: Ahmed Prima Primary Care Physician: [name] Other Clinician: Referring Physician: [name] Treating Physician/Extender: Frann Rider in Treatment: 6 Education Assessment Education Provided To: Patient Education Topics Provided Wound/Skin Impairment: Handouts: Other: change dressing as ordered Methods: Demonstration, Explain/Verbal Responses: State content correctly Electronic Signature(s) Signed: 08/07/2016 4:23:06 PM By: Alric Quan Entered By: Alric Quan on 08/07/2016 10:06:09 Glockner, Alease Medina D. ([id number]) -------------------------------------------------------------------------------- Wound Assessment Details Patient Name: Jeremy Gang D. Date of Service: 08/07/2016 9:45 AM Medical Record Number: [id number] Patient Account Number: [id number] Date of Birth/Sex: 1940-09-04 (76 y.o. Male) Treating RN: Ahmed Prima Primary Care Samrat Hayward: [name] Other Clinician: Referring Donis Pinder: [name] Treating Marques Ericson/Extender: Frann Rider in Treatment: 6 Wound Status Wound Number: 1 Primary Pressure Ulcer Etiology: Wound Location: Left Calcaneus Wound Open Wounding Event: Pressure Injury Status: Date Acquired: 05/08/2016 Comorbid Chronic Obstructive Pulmonary Weeks Of Treatment: 6 History: Disease (COPD), Arrhythmia, Clustered Wound: No Hypertension, Seizure Disorder Photos Photo Uploaded By: Alric Quan on 08/08/2016 07:49:41 Wound Measurements Length: (cm) 0.2 Width: (cm) 0.5 Depth: (cm) 0.1 Area: (cm) 0.079 Volume: (cm) 0.008 % Reduction in Area: 99% % Reduction in Volume: 99.7% Epithelialization: Small (1-33%) Tunneling: No Undermining: No Wound Description Classification: Category/Stage III Foul Odor Aft Wound Margin: Distinct, outline attached Due to Produc Exudate Amount: Large Slough/Fibrin Exudate Type: Serous Exudate Color: amber er Cleansing: Yes t Use:  No o Yes Wound  Bed Granulation Amount: Medium (34-66%) Granulation Quality: Red Necrotic Amount: Medium (34-66%) Necrotic Quality: Adherent [address], Jasen D. ([id number]) Periwound Skin Texture Texture Color No Abnormalities Noted: No No Abnormalities Noted: No Moisture Temperature / Pain No Abnormalities Noted: No Temperature: No Abnormality Tenderness on Palpation: Yes Wound Preparation Ulcer Cleansing: Rinsed/Irrigated with Saline Topical Anesthetic Applied: Other: lidocaine 4%, Electronic Signature(s) Signed: 08/07/2016 4:23:06 PM By: Alric Quan Entered By: Alric Quan on 08/07/2016 10:00:58 KAELON, WEEKES D. ([id number]) -------------------------------------------------------------------------------- Vitals Details Patient Name: Jeremy Gang D. Date of Service: 08/07/2016 9:45 AM Medical Record Number: [id number] Patient Account Number: [id number] Date of Birth/Sex: 1941/01/24 (75 y.o. Male) Treating RN: Ahmed Prima Primary Care Julio Storr: [name] Other Clinician: Referring Gale Klar: [name] Treating Yuritza Paulhus/Extender: Frann Rider in Treatment: 6 Vital Signs Time Taken: 09:54 Pulse (bpm): 50 Weight (lbs): 128.8 Respiratory Rate (breaths/min): 18 Blood Pressure (mmHg): 169/58 Reference Range: 80 - 120 mg / dl Electronic Signature(s) Signed: 08/07/2016 4:23:06 PM By: Alric Quan Entered By: Alric Quan on 08/07/2016 09:56:14

## 2016-08-11 ENCOUNTER — Telehealth: Payer: Self-pay | Admitting: Nurse Practitioner

## 2016-08-11 NOTE — Telephone Encounter (Signed)
left msg asking pt to confirm this AWV-S (last AWV 04/12/15) w/ nurse and CPE w/ [name]. VDM (DD)

## 2016-08-12 ENCOUNTER — Ambulatory Visit: Payer: Medicare Other | Admitting: Cardiology

## 2016-08-13 ENCOUNTER — Encounter (HOSPITAL_COMMUNITY)
Admission: RE | Disposition: A | Discharge status: Home / Self Care | Payer: Self-pay | Source: Ambulatory Visit | Attending: Cardiovascular Disease

## 2016-08-13 ENCOUNTER — Ambulatory Visit (HOSPITAL_COMMUNITY)
Admission: RE | Admit: 2016-08-13 | Discharge: 2016-08-13 | Disposition: A | Discharge status: Home / Self Care | Payer: Medicare Other | Source: Ambulatory Visit | Attending: Cardiovascular Disease | Admitting: Cardiovascular Disease

## 2016-08-13 ENCOUNTER — Ambulatory Visit: Payer: Medicare Other | Admitting: Cardiology

## 2016-08-13 DIAGNOSIS — F17210 Nicotine dependence, cigarettes, uncomplicated: Secondary | ICD-10-CM | POA: Diagnosis not present

## 2016-08-13 DIAGNOSIS — L89629 Pressure ulcer of left heel, unspecified stage: Secondary | ICD-10-CM | POA: Insufficient documentation

## 2016-08-13 DIAGNOSIS — E7800 Pure hypercholesterolemia, unspecified: Secondary | ICD-10-CM | POA: Insufficient documentation

## 2016-08-13 DIAGNOSIS — N183 Chronic kidney disease, stage 3 (moderate): Secondary | ICD-10-CM | POA: Diagnosis not present

## 2016-08-13 DIAGNOSIS — Z880 Allergy status to penicillin: Secondary | ICD-10-CM | POA: Diagnosis not present

## 2016-08-13 DIAGNOSIS — I480 Paroxysmal atrial fibrillation: Secondary | ICD-10-CM | POA: Insufficient documentation

## 2016-08-13 DIAGNOSIS — I70213 Atherosclerosis of native arteries of extremities with intermittent claudication, bilateral legs: Secondary | ICD-10-CM | POA: Diagnosis not present

## 2016-08-13 DIAGNOSIS — Z8774 Personal history of (corrected) congenital malformations of heart and circulatory system: Secondary | ICD-10-CM | POA: Insufficient documentation

## 2016-08-13 DIAGNOSIS — E878 Other disorders of electrolyte and fluid balance, not elsewhere classified: Secondary | ICD-10-CM | POA: Insufficient documentation

## 2016-08-13 DIAGNOSIS — I739 Peripheral vascular disease, unspecified: Secondary | ICD-10-CM | POA: Diagnosis present

## 2016-08-13 DIAGNOSIS — I70244 Atherosclerosis of native arteries of left leg with ulceration of heel and midfoot: Secondary | ICD-10-CM | POA: Diagnosis not present

## 2016-08-13 DIAGNOSIS — I129 Hypertensive chronic kidney disease with stage 1 through stage 4 chronic kidney disease, or unspecified chronic kidney disease: Secondary | ICD-10-CM | POA: Diagnosis not present

## 2016-08-13 DIAGNOSIS — J449 Chronic obstructive pulmonary disease, unspecified: Secondary | ICD-10-CM | POA: Diagnosis not present

## 2016-08-13 DIAGNOSIS — G40909 Epilepsy, unspecified, not intractable, without status epilepticus: Secondary | ICD-10-CM | POA: Diagnosis not present

## 2016-08-13 DIAGNOSIS — Z8249 Family history of ischemic heart disease and other diseases of the circulatory system: Secondary | ICD-10-CM | POA: Diagnosis not present

## 2016-08-13 HISTORY — PX: PERIPHERAL VASCULAR BALLOON ANGIOPLASTY: CATH118281

## 2016-08-13 HISTORY — PX: ABDOMINAL AORTOGRAM W/LOWER EXTREMITY: CATH118223

## 2016-08-13 LAB — POCT ACTIVATED CLOTTING TIME
ACTIVATED CLOTTING TIME: 175 s
Activated Clotting Time: 202 seconds
Activated Clotting Time: 230 seconds
Activated Clotting Time: 208 seconds
Activated Clotting Time: 186 seconds

## 2016-08-13 SURGERY — ABDOMINAL AORTOGRAM W/LOWER EXTREMITY
Anesthesia: LOCAL

## 2016-08-13 MED ORDER — ONDANSETRON HCL 4 MG/2ML IJ SOLN
INTRAMUSCULAR | Status: AC
Start: 2016-08-13 — End: 2016-08-13
  Filled 2016-08-13: qty 2

## 2016-08-13 MED ORDER — ONDANSETRON HCL 4 MG/2ML IJ SOLN
4.0000 mg | Freq: Once | INTRAMUSCULAR | Status: AC
  Administered 2016-08-13: 4 mg via INTRAVENOUS

## 2016-08-13 MED ORDER — SODIUM CHLORIDE 0.9% FLUSH: 3.0000 mL | Freq: Two times a day (BID) | INTRAVENOUS | Status: DC

## 2016-08-13 MED ORDER — HEPARIN (PORCINE) IN NACL 2-0.9 UNIT/ML-% IJ SOLN
INTRAMUSCULAR | Status: AC
  Filled 2016-08-13: qty 1000

## 2016-08-13 MED ORDER — HEPARIN SODIUM (PORCINE) 1000 UNIT/ML IJ SOLN
INTRAMUSCULAR | Status: DC | PRN
  Administered 2016-08-13: 6000 [IU] via INTRAVENOUS
  Administered 2016-08-13: 2000 [IU] via INTRAVENOUS

## 2016-08-13 MED ORDER — SODIUM CHLORIDE 0.9 % WEIGHT BASED INFUSION
3.0000 mL/kg/h | INTRAVENOUS | Status: DC
  Administered 2016-08-13: 3 mL/kg/h via INTRAVENOUS

## 2016-08-13 MED ORDER — FENTANYL CITRATE (PF) 100 MCG/2ML IJ SOLN
INTRAMUSCULAR | Status: DC | PRN
  Administered 2016-08-13: 50 ug via INTRAVENOUS

## 2016-08-13 MED ORDER — SODIUM CHLORIDE 0.9% FLUSH: 3.0000 mL | INTRAVENOUS | Status: DC | PRN

## 2016-08-13 MED ORDER — SODIUM CHLORIDE 0.9 % IV SOLN: 250.0000 mL | INTRAVENOUS | Status: DC | PRN

## 2016-08-13 MED ORDER — ATROPINE SULFATE 1 MG/10ML IJ SOSY
PREFILLED_SYRINGE | INTRAMUSCULAR | Status: AC
  Filled 2016-08-13: qty 10

## 2016-08-13 MED ORDER — ATROPINE SULFATE 1 MG/10ML IJ SOSY
1.0000 mg | PREFILLED_SYRINGE | Freq: Once | INTRAMUSCULAR | Status: AC
  Administered 2016-08-13: 1 mg via INTRAVENOUS

## 2016-08-13 MED ORDER — HEPARIN (PORCINE) IN NACL 2-0.9 UNIT/ML-% IJ SOLN
INTRAMUSCULAR | Status: DC | PRN
  Administered 2016-08-13: 1000 mL

## 2016-08-13 MED ORDER — HYDRALAZINE HCL 20 MG/ML IJ SOLN
INTRAMUSCULAR | Status: AC
  Filled 2016-08-13: qty 1

## 2016-08-13 MED ORDER — SODIUM CHLORIDE 0.9 % IV SOLN: INTRAVENOUS | Status: AC

## 2016-08-13 MED ORDER — MIDAZOLAM HCL 2 MG/2ML IJ SOLN
INTRAMUSCULAR | Status: DC | PRN
  Administered 2016-08-13 (×2): 1 mg via INTRAVENOUS

## 2016-08-13 MED ORDER — HYDRALAZINE HCL 20 MG/ML IJ SOLN: 10.0000 mg | INTRAMUSCULAR | Status: DC | PRN

## 2016-08-13 MED ORDER — FENTANYL CITRATE (PF) 100 MCG/2ML IJ SOLN
INTRAMUSCULAR | Status: AC
  Filled 2016-08-13: qty 2

## 2016-08-13 MED ORDER — MIDAZOLAM HCL 2 MG/2ML IJ SOLN
INTRAMUSCULAR | Status: AC
Start: 2016-08-13 — End: 2016-08-13
  Filled 2016-08-13: qty 2

## 2016-08-13 MED ORDER — ASPIRIN 81 MG PO CHEW: 81.0000 mg | CHEWABLE_TABLET | ORAL | Status: DC

## 2016-08-13 MED ORDER — IODIXANOL 320 MG/ML IV SOLN
INTRAVENOUS | Status: DC | PRN
  Administered 2016-08-13: 100 mL via INTRA_ARTERIAL

## 2016-08-13 MED ORDER — LIDOCAINE HCL (PF) 1 % IJ SOLN
INTRAMUSCULAR | Status: DC | PRN
  Administered 2016-08-13: 15 mL via SUBCUTANEOUS

## 2016-08-13 MED ORDER — LIDOCAINE HCL 1 % IJ SOLN
INTRAMUSCULAR | Status: AC
Start: 2016-08-13 — End: 2016-08-13
  Filled 2016-08-13: qty 20

## 2016-08-13 MED ORDER — SODIUM CHLORIDE 0.9 % WEIGHT BASED INFUSION: 1.0000 mL/kg/h | INTRAVENOUS | Status: DC

## 2016-08-13 SURGICAL SUPPLY — 26 items
CATH ANGIO 5F PIGTAIL 65CM (CATHETERS) ×3 IMPLANT
CATH CROSS OVER TEMPO 5F (CATHETERS) ×3 IMPLANT
CATH NAVICROSS ST .035X135CM (MICROCATHETER) ×3 IMPLANT
CATH OUTBACK ELITE RE-ENTRY (CATHETERS) ×3 IMPLANT
CATH SOFT-VU 4F 65 STRAIGHT (CATHETERS) ×2 IMPLANT
CATH SOFT-VU STRAIGHT 4F 65CM (CATHETERS) ×1
CATH STRAIGHT 5FR 65CM (CATHETERS) ×3 IMPLANT
DEVICE TORQUE .014-.018 (MISCELLANEOUS) ×2 IMPLANT
GUIDEWIRE ANGLED .035X260CM (WIRE) ×3 IMPLANT
GUIDEWIRE ASTATO XS 20G 300CM (WIRE) ×3 IMPLANT
KIT MICROINTRODUCER STIFF 5F (SHEATH) ×3 IMPLANT
KIT PV (KITS) ×3 IMPLANT
KIT SINGLE-Y CONNECTOR (CONNECTOR) ×3 IMPLANT
SHEATH HIGHFLEX ANSEL 6FRX55 (SHEATH) ×3 IMPLANT
SHEATH PINNACLE 5F 10CM (SHEATH) ×3 IMPLANT
STOPCOCK MORSE 400PSI 3WAY (MISCELLANEOUS) ×3 IMPLANT
SYRINGE MEDRAD AVANTA MACH 7 (SYRINGE) ×3 IMPLANT
TAPE RADIOPAQUE TURBO (MISCELLANEOUS) ×3 IMPLANT
TORQUE DEVICE .014-.018 (MISCELLANEOUS) ×3
TRANSDUCER W/STOPCOCK (MISCELLANEOUS) ×3 IMPLANT
TRAY PV CATH (CUSTOM PROCEDURE TRAY) ×3 IMPLANT
TUBING CIL FLEX 10 FLL-RA (TUBING) ×3 IMPLANT
WIRE AMPLATZ SSTIFF .035X260CM (WIRE) ×3 IMPLANT
WIRE ASAHI CONFIANZPRO12 300CM (WIRE) ×3 IMPLANT
WIRE HITORQ VERSACORE ST 145CM (WIRE) ×3 IMPLANT
WIRE ROSEN-J .035X180CM (WIRE) ×3 IMPLANT

## 2016-08-13 NOTE — Progress Notes (Addendum)
Site area: RFA Site Prior to Removal:  Level 0 Pressure Applied For:30 min Manual:  yes  Patient Status During Pull:  Vagal-HR to33 Post Pull Site:  Level 0 Post Pull Instructions Given:  yes Post Pull Pulses Present: doppler Dressing Applied:  tegaderm Bedrest begins @ 4034 till 1845 Comments: 1.0 mg atropine 4mg  zofran given Dr Fletcher Anon notified Sheath removed by Gentry Fitz Dr Fletcher Anon in to see pt prior to tx to short stay.

## 2016-08-13 NOTE — Discharge Instructions (Signed)

## 2016-08-13 NOTE — Progress Notes (Signed)
Pt noted to have poor short term memory, oriented to self only. Agitates easily. Preop information obtained by pt's sister Virl Cagey James/cell/[phone] (present) and pt's cousin [name]/cell/[phone] (via phone).

## 2016-08-13 NOTE — H&P (View-Only) (Signed)
Cardiology Office Note   Date:  08/05/2016   ID:  [name], DOB 1941-03-04, MRN [id number]  PCP:  [name], NP  Cardiologist: Dr. Radford Pax  Chief Complaint  Patient presents with  . New Evaluation    blood clot      History of Present Illness: [name] is a 76 y.o. male who was referred by Dr. Con Memos for evaluation of possible peripheral arterial disease. 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 He was not felt to be a good candidate for anticoagulation given history of heavy alcohol use. Echocardiogram showed normal LV systolic function. He had a rash with diltiazem and was subsequently switched to carvedilol. He developed a pressure ulcer on the left heel after his hospitalization in January which has been slow to heal. He has been going to the wound clinic on a weekly basis. He underwent noninvasive vascular evaluation in our office which showed an ABI of 0.52 on the left with significant left common and external iliac artery stenosis with velocity greater than 350 and an occluded left mid SFA. He quit alcohol drinking since his hospitalization in January . However, he continues to smoke about 10 cigarettes a day.   Past Medical History:  Diagnosis Date  . Alcohol abuse   . Aneurysm ([place])   . CKD (chronic kidney disease), stage III   . COPD (chronic obstructive pulmonary disease) ([place])   . Hyperchloremia   . Hypercholesteremia   . Hyperpotassemia   . Hypertension   . Hypertensive renal disease, benign   . Seizures ([place])    has not had a seizure in 15 yrs  . Tobacco use     Past Surgical History:  Procedure Laterality Date  . 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, 1996  . EYE SURGERY     December 2017     Current Outpatient Prescriptions  Medication Sig Dispense Refill  .  atorvastatin (LIPITOR) 10 MG tablet TAKE 1 TABLET (10 MG TOTAL) BY MOUTH DAILY. 90 tablet 1  . carvedilol (COREG) 6.25 MG tablet TAKE 1 TABLET (6.25 MG TOTAL) BY MOUTH 2 (TWO) TIMES DAILY WITH A MEAL. 60 tablet 0  . [facility] ARTIFICIAL TEARS 1-0.3 % SOLN PLACE 1 DROP IN EACHEYE EVERY 6 HOURS AS NEEDED FOR DRY EYES  0  . [facility] B-1 100 MG tablet TAKE 1 TABLET (100 MG TOTAL) BY MOUTH DAILY. 30 tablet 0  . divalproex (DEPAKOTE) 250 MG DR tablet TAKE 1 TABLET BY MOUTH 3 TIMES A DAY FOR SEIZURES 90 tablet 1  . furosemide (LASIX) 40 MG tablet TAKE 1 TABLET BY MOUTH ONCE DAILY FOR EDEMA 30 tablet 0  . hydrochlorothiazide (HYDRODIURIL) 25 MG tablet Take 25 mg by mouth daily.    Marland Kitchen levETIRAcetam (KEPPRA) 500 MG tablet Take one tablet by mouth twice daily 180 tablet 1  . LORazepam (ATIVAN) 0.5 MG tablet TAKE 1 TABLET BY MOUTH EVERY 12 HOURS 60 tablet 0  . Multiple Vitamins-Minerals (THEREMS-M) TABS Take 1 tablet by mouth daily. 90 tablet 3   No current facility-administered medications for this visit.     Allergies:   Diltiazem and Penicillins    Social History:  The patient  reports that he has quit smoking. He quit after 56.00 years of use. He has never used smokeless tobacco. He reports that he drinks alcohol. He reports that  he does not use drugs.   Family History:  The patient's family history includes Alzheimer's disease in his sister; Cancer in his mother; Clotting disorder in his sister and sister; Congestive Heart Failure in his brother, father, and sister.    ROS:  Please see the history of present illness.   Otherwise, review of systems are positive for none.   All other systems are reviewed and negative.    PHYSICAL EXAM: VS:  BP (!) 154/82   Pulse 64   Ht 5\' 6"  (1.676 m)   Wt 123 lb 3.2 oz (55.9 kg)   BMI 19.89 kg/m  , BMI Body mass index is 19.89 kg/m. GEN: Well nourished, well developed, in no acute distress  HEENT: normal  Neck: no JVD, carotid bruits, or masses Cardiac: RRR; no  murmurs, rubs, or gallops,no edema  Respiratory:  clear to auscultation bilaterally, normal work of breathing GI: soft, nontender, nondistended, + BS MS: no deformity or atrophy  Skin: warm and dry, no rash Neuro:  Strength and sensation are intact Psych: euthymic mood, full affect Vascular: Femoral pulses +1 bilaterally with bruits. Distal pulses are not palpable. There is small superficial ulceration affecting the left heel  EKG:  EKG is not ordered today.    Recent Labs: 04/14/2016: B Natriuretic Peptide 1,172.2; Magnesium 1.9; TSH 1.992 06/19/2016: ALT 6; BUN 16; Creat 1.10; Hemoglobin 9.5; Platelets 218; Potassium 4.5; Sodium 144    Lipid Panel    Component Value Date/Time   CHOL 164 04/15/2016 0211   CHOL 206 (H) 07/13/2014 1130   TRIG 48 04/15/2016 0211   HDL 103 04/15/2016 0211   HDL 112 07/13/2014 1130   CHOLHDL 1.6 04/15/2016 0211   VLDL 10 04/15/2016 0211   LDLCALC 51 04/15/2016 0211   LDLCALC 83 07/13/2014 1130      Wt Readings from Last 3 Encounters:  08/05/16 123 lb 3.2 oz (55.9 kg)  07/08/16 127 lb (57.6 kg)  06/19/16 126 lb 6.4 oz (57.3 kg)       No flowsheet data found.    ASSESSMENT AND PLAN:  1.  Peripheral arterial disease: The patient has nonhealing pressure ulcer affecting the left heel. He has multilevel disease including iliac and SFA and I suspect that this ulceration might not heal completely without improving the blood flow. I discussed this in details with him and recommend proceeding with abdominal aortogram, lower extremity runoff and possible endovascular intervention. I discussed the risks and benefits. Start aspirin 81 mg once daily. Planned access is via the right common femoral artery.  2. Hyperlipidemia: Continue treatment with atorvastatin.  3. Tobacco use: He slowed down on smoking but has not been able to quit.  4. Paroxysmal atrial fibrillation: Currently in regular rhythm. Atrial fibrillation was in the setting of excessive  alcohol use. If he develops recurrent atrial fibrillation, anticoagulation should be considered given that he stop drinking alcohol.   Disposition:   FU with me in 1 month  Signed,  Kathlyn Sacramento, MD  08/05/2016 10:30 AM    [place]

## 2016-08-13 NOTE — Interval H&P Note (Signed)
History and Physical Interval Note:  08/13/2016 8:20 AM  [name]  has presented today for surgery, with the diagnosis of PAD  The various methods of treatment have been discussed with the patient and family. After consideration of risks, benefits and other options for treatment, the patient has consented to  Procedure(s): Abdominal Aortogram w/Lower Extremity (N/A) as a surgical intervention .  The patient's history has been reviewed, patient examined, no change in status, stable for surgery.  I have reviewed the patient's chart and labs.  Questions were answered to the patient's satisfaction.     Kathlyn Sacramento

## 2016-08-14 ENCOUNTER — Encounter (HOSPITAL_COMMUNITY): Payer: Self-pay | Admitting: Cardiovascular Disease

## 2016-08-14 ENCOUNTER — Telehealth: Payer: Self-pay

## 2016-08-14 NOTE — Telephone Encounter (Signed)
Transition Care Management Follow-Up Telephone Call   Date discharged and where: [facility] on 08/13/16  How have you been since you were released from the hospital?  Per daughter/POA: Leg is hurting but other than that he is ok. Confused but she thinks its dementia.  Any patient concerns? "How do I get him to stop smoking" Talked to pt about ways for smoking cessation  Items Reviewed:   Meds: Y  Allergies: Y  Dietary Changes Reviewed: Y  Functional Questionnaire:  Independent-I Dependent-D  ADLs:   Dressing- I    Eating- I   Maintaining continence- I   Transferring- I   Transportation- D   Meal Prep- D   Managing Meds- D   Confirmed importance and Date/Time of follow-up visits scheduled: Yes. Pt will be going back for surgery in 6 days. Will schedule a F/U after that   Confirmed with patient if condition worsens to call PCP or go to the Emergency Dept. Patient was given office number and encouraged to call back with questions or concerns: Darreld Mclean

## 2016-08-15 ENCOUNTER — Ambulatory Visit: Payer: Medicare Other | Admitting: Surgery

## 2016-08-15 ENCOUNTER — Telehealth: Payer: Self-pay | Admitting: Cardiovascular Disease

## 2016-08-15 NOTE — Telephone Encounter (Signed)
[facility]) calling in regard to cath scheduled for 08-20-16. Ms. Lauro Regulus would like to know if patient needs to fast. She would also like to know if there is anyway the patient could stay overnight after the procedure as it was difficult for her to keep him comfortable last time. Please call to discuss. Thanks.

## 2016-08-15 NOTE — Telephone Encounter (Signed)
Reviewed preprocedure instructions for pt PV procedure in detail w Wallis McBridge POA/DPR, including time change, arrival instructions, NPO status, and med holds. She expressed understanding and thanks. Aware to call back if further questions/concerns

## 2016-08-18 ENCOUNTER — Ambulatory Visit (INDEPENDENT_AMBULATORY_CARE_PROVIDER_SITE_OTHER): Payer: Medicare Other | Admitting: Nurse Practitioner

## 2016-08-18 ENCOUNTER — Inpatient Hospital Stay (HOSPITAL_COMMUNITY): Payer: Medicare Other

## 2016-08-18 ENCOUNTER — Encounter: Payer: Self-pay | Admitting: Nurse Practitioner

## 2016-08-18 ENCOUNTER — Emergency Department (HOSPITAL_COMMUNITY): Payer: Medicare Other

## 2016-08-18 ENCOUNTER — Inpatient Hospital Stay (HOSPITAL_COMMUNITY)
Admission: EM | Admit: 2016-08-18 | Discharge: 2016-08-23 | DRG: 190 | Disposition: A | Discharge status: Skilled Nursing Facility | Payer: Medicare Other | Attending: Internal Medicine | Admitting: Internal Medicine

## 2016-08-18 VITALS — BP 128/84 | HR 62 | Temp 98.5°F | Resp 16

## 2016-08-18 DIAGNOSIS — N179 Acute kidney failure, unspecified: Secondary | ICD-10-CM | POA: Diagnosis not present

## 2016-08-18 DIAGNOSIS — L8990 Pressure ulcer of unspecified site, unspecified stage: Secondary | ICD-10-CM | POA: Insufficient documentation

## 2016-08-18 DIAGNOSIS — E872 Acidosis, unspecified: Secondary | ICD-10-CM

## 2016-08-18 DIAGNOSIS — L89629 Pressure ulcer of left heel, unspecified stage: Secondary | ICD-10-CM | POA: Diagnosis not present

## 2016-08-18 DIAGNOSIS — R627 Adult failure to thrive: Secondary | ICD-10-CM | POA: Diagnosis present

## 2016-08-18 DIAGNOSIS — I130 Hypertensive heart and chronic kidney disease with heart failure and stage 1 through stage 4 chronic kidney disease, or unspecified chronic kidney disease: Secondary | ICD-10-CM | POA: Diagnosis present

## 2016-08-18 DIAGNOSIS — N183 Chronic kidney disease, stage 3 unspecified: Secondary | ICD-10-CM | POA: Diagnosis present

## 2016-08-18 DIAGNOSIS — R509 Fever, unspecified: Secondary | ICD-10-CM | POA: Diagnosis not present

## 2016-08-18 DIAGNOSIS — J440 Chronic obstructive pulmonary disease with acute lower respiratory infection: Secondary | ICD-10-CM | POA: Diagnosis not present

## 2016-08-18 DIAGNOSIS — R531 Weakness: Secondary | ICD-10-CM

## 2016-08-18 DIAGNOSIS — Z880 Allergy status to penicillin: Secondary | ICD-10-CM

## 2016-08-18 DIAGNOSIS — I480 Paroxysmal atrial fibrillation: Secondary | ICD-10-CM | POA: Diagnosis present

## 2016-08-18 DIAGNOSIS — I2510 Atherosclerotic heart disease of native coronary artery without angina pectoris: Secondary | ICD-10-CM | POA: Diagnosis not present

## 2016-08-18 DIAGNOSIS — Z79899 Other long term (current) drug therapy: Secondary | ICD-10-CM

## 2016-08-18 DIAGNOSIS — E860 Dehydration: Secondary | ICD-10-CM | POA: Diagnosis not present

## 2016-08-18 DIAGNOSIS — I10 Essential (primary) hypertension: Secondary | ICD-10-CM | POA: Diagnosis present

## 2016-08-18 DIAGNOSIS — Z888 Allergy status to other drugs, medicaments and biological substances status: Secondary | ICD-10-CM

## 2016-08-18 DIAGNOSIS — I708 Atherosclerosis of other arteries: Secondary | ICD-10-CM | POA: Diagnosis present

## 2016-08-18 DIAGNOSIS — D649 Anemia, unspecified: Secondary | ICD-10-CM

## 2016-08-18 DIAGNOSIS — Z66 Do not resuscitate: Secondary | ICD-10-CM | POA: Diagnosis present

## 2016-08-18 DIAGNOSIS — D638 Anemia in other chronic diseases classified elsewhere: Secondary | ICD-10-CM | POA: Diagnosis present

## 2016-08-18 DIAGNOSIS — E7800 Pure hypercholesterolemia, unspecified: Secondary | ICD-10-CM | POA: Diagnosis not present

## 2016-08-18 DIAGNOSIS — I739 Peripheral vascular disease, unspecified: Secondary | ICD-10-CM

## 2016-08-18 DIAGNOSIS — R569 Unspecified convulsions: Secondary | ICD-10-CM

## 2016-08-18 DIAGNOSIS — E875 Hyperkalemia: Secondary | ICD-10-CM | POA: Diagnosis present

## 2016-08-18 DIAGNOSIS — R7989 Other specified abnormal findings of blood chemistry: Secondary | ICD-10-CM | POA: Diagnosis not present

## 2016-08-18 DIAGNOSIS — G40909 Epilepsy, unspecified, not intractable, without status epilepticus: Secondary | ICD-10-CM | POA: Diagnosis present

## 2016-08-18 DIAGNOSIS — F17200 Nicotine dependence, unspecified, uncomplicated: Secondary | ICD-10-CM | POA: Diagnosis present

## 2016-08-18 DIAGNOSIS — Z7982 Long term (current) use of aspirin: Secondary | ICD-10-CM

## 2016-08-18 DIAGNOSIS — R21 Rash and other nonspecific skin eruption: Secondary | ICD-10-CM | POA: Diagnosis not present

## 2016-08-18 DIAGNOSIS — I5032 Chronic diastolic (congestive) heart failure: Secondary | ICD-10-CM | POA: Diagnosis not present

## 2016-08-18 DIAGNOSIS — S91309A Unspecified open wound, unspecified foot, initial encounter: Secondary | ICD-10-CM

## 2016-08-18 DIAGNOSIS — Z87891 Personal history of nicotine dependence: Secondary | ICD-10-CM

## 2016-08-18 DIAGNOSIS — R05 Cough: Secondary | ICD-10-CM

## 2016-08-18 DIAGNOSIS — Z8249 Family history of ischemic heart disease and other diseases of the circulatory system: Secondary | ICD-10-CM

## 2016-08-18 DIAGNOSIS — E785 Hyperlipidemia, unspecified: Secondary | ICD-10-CM | POA: Diagnosis present

## 2016-08-18 DIAGNOSIS — R059 Cough, unspecified: Secondary | ICD-10-CM

## 2016-08-18 DIAGNOSIS — E8809 Other disorders of plasma-protein metabolism, not elsewhere classified: Secondary | ICD-10-CM | POA: Diagnosis present

## 2016-08-18 DIAGNOSIS — D72828 Other elevated white blood cell count: Secondary | ICD-10-CM

## 2016-08-18 DIAGNOSIS — J189 Pneumonia, unspecified organism: Secondary | ICD-10-CM | POA: Diagnosis not present

## 2016-08-18 DIAGNOSIS — R600 Localized edema: Secondary | ICD-10-CM | POA: Diagnosis present

## 2016-08-18 DIAGNOSIS — R4182 Altered mental status, unspecified: Secondary | ICD-10-CM

## 2016-08-18 DIAGNOSIS — S91302A Unspecified open wound, left foot, initial encounter: Secondary | ICD-10-CM

## 2016-08-18 HISTORY — DX: Fever, unspecified: R50.9

## 2016-08-18 LAB — RESPIRATORY PANEL BY PCR
Adenovirus: NOT DETECTED
BORDETELLA PERTUSSIS-RVPCR: NOT DETECTED
CHLAMYDOPHILA PNEUMONIAE-RVPPCR: NOT DETECTED
CORONAVIRUS OC43-RVPPCR: NOT DETECTED
Coronavirus 229E: NOT DETECTED
Coronavirus HKU1: NOT DETECTED
Coronavirus NL63: NOT DETECTED
INFLUENZA B-RVPPCR: NOT DETECTED
Influenza A: NOT DETECTED
Metapneumovirus: NOT DETECTED
Mycoplasma pneumoniae: NOT DETECTED
PARAINFLUENZA VIRUS 3-RVPPCR: NOT DETECTED
PARAINFLUENZA VIRUS 4-RVPPCR: NOT DETECTED
Parainfluenza Virus 1: NOT DETECTED
Parainfluenza Virus 2: NOT DETECTED
Respiratory Syncytial Virus: NOT DETECTED
Rhinovirus / Enterovirus: NOT DETECTED

## 2016-08-18 LAB — CBC WITH DIFFERENTIAL/PLATELET
BASOS PCT: 1 %
Basophils Absolute: 0.1 10*3/uL (ref 0.0–0.1)
EOS PCT: 4 %
Eosinophils Absolute: 0.5 10*3/uL (ref 0.0–0.7)
HEMATOCRIT: 24.1 % — AB (ref 39.0–52.0)
HEMOGLOBIN: 7.9 g/dL — AB (ref 13.0–17.0)
Lymphocytes Relative: 15 %
Lymphs Abs: 2 10*3/uL (ref 0.7–4.0)
MCH: 28.5 pg (ref 26.0–34.0)
MCHC: 32.8 g/dL (ref 30.0–36.0)
MCV: 87 fL (ref 78.0–100.0)
MONO ABS: 0.4 10*3/uL (ref 0.1–1.0)
MONOS PCT: 3 %
NEUTROS ABS: 10.1 10*3/uL — AB (ref 1.7–7.7)
Neutrophils Relative %: 77 %
Platelets: 189 10*3/uL (ref 150–400)
RBC: 2.77 MIL/uL — AB (ref 4.22–5.81)
RDW: 16.1 % — AB (ref 11.5–15.5)
WBC: 13.1 10*3/uL — AB (ref 4.0–10.5)

## 2016-08-18 LAB — COMPREHENSIVE METABOLIC PANEL
ALK PHOS: 55 U/L (ref 38–126)
ALT: 22 U/L (ref 17–63)
AST: 29 U/L (ref 15–41)
Albumin: 2.9 g/dL — ABNORMAL LOW (ref 3.5–5.0)
Anion gap: 12 (ref 5–15)
BILIRUBIN TOTAL: 0.5 mg/dL (ref 0.3–1.2)
BUN: 41 mg/dL — AB (ref 6–20)
CHLORIDE: 104 mmol/L (ref 101–111)
CO2: 22 mmol/L (ref 22–32)
CREATININE: 1.83 mg/dL — AB (ref 0.61–1.24)
Calcium: 8.4 mg/dL — ABNORMAL LOW (ref 8.9–10.3)
GFR, EST AFRICAN AMERICAN: 39 mL/min — AB (ref >60)
GFR, EST NON AFRICAN AMERICAN: 34 mL/min — AB (ref >60)
GLUCOSE: 143 mg/dL — AB (ref 65–99)
Potassium: 5.2 mmol/L — ABNORMAL HIGH (ref 3.5–5.1)
Sodium: 138 mmol/L (ref 135–145)
Total Protein: 6.8 g/dL (ref 6.5–8.1)

## 2016-08-18 LAB — URINALYSIS, ROUTINE W REFLEX MICROSCOPIC
Bilirubin Urine: NEGATIVE
GLUCOSE, UA: NEGATIVE mg/dL
Hgb urine dipstick: NEGATIVE
KETONES UR: NEGATIVE mg/dL
Leukocytes, UA: NEGATIVE
Nitrite: NEGATIVE
PH: 5 (ref 5.0–8.0)
Protein, ur: NEGATIVE mg/dL
SPECIFIC GRAVITY, URINE: 1.013 (ref 1.005–1.030)

## 2016-08-18 LAB — I-STAT CG4 LACTIC ACID, ED
LACTIC ACID, VENOUS: 1.13 mmol/L (ref 0.5–1.9)
Lactic Acid, Venous: 3.05 mmol/L (ref 0.5–1.9)

## 2016-08-18 LAB — LACTIC ACID, PLASMA: Lactic Acid, Venous: 2.1 mmol/L (ref 0.5–1.9)

## 2016-08-18 LAB — POC OCCULT BLOOD, ED: Fecal Occult Bld: NEGATIVE

## 2016-08-18 MED ORDER — ONDANSETRON HCL 4 MG/2ML IJ SOLN: 4.0000 mg | Freq: Four times a day (QID) | INTRAMUSCULAR | Status: DC | PRN

## 2016-08-18 MED ORDER — SODIUM CHLORIDE 0.9 % IV BOLUS (SEPSIS)
500.0000 mL | Freq: Once | INTRAVENOUS | Status: AC
Start: 2016-08-18 — End: 2016-08-19
  Administered 2016-08-18: 500 mL via INTRAVENOUS

## 2016-08-18 MED ORDER — DEXTROSE 5 % IV SOLN
2.0000 g | Freq: Once | INTRAVENOUS | Status: AC
  Administered 2016-08-18: 2 g via INTRAVENOUS
  Filled 2016-08-18: qty 2

## 2016-08-18 MED ORDER — DEXTROSE 5 % IV SOLN
2.0000 g | INTRAVENOUS | Status: DC
  Administered 2016-08-19 – 2016-08-20 (×2): 2 g via INTRAVENOUS
  Filled 2016-08-18 (×2): qty 2

## 2016-08-18 MED ORDER — ACETAMINOPHEN 325 MG PO TABS: 650.0000 mg | ORAL_TABLET | Freq: Four times a day (QID) | ORAL | Status: DC | PRN

## 2016-08-18 MED ORDER — DIVALPROEX SODIUM 250 MG PO DR TAB
250.0000 mg | DELAYED_RELEASE_TABLET | Freq: Three times a day (TID) | ORAL | Status: DC
  Administered 2016-08-18 – 2016-08-23 (×15): 250 mg via ORAL
  Filled 2016-08-18 (×16): qty 1

## 2016-08-18 MED ORDER — HEPARIN SODIUM (PORCINE) 5000 UNIT/ML IJ SOLN
5000.0000 [IU] | Freq: Three times a day (TID) | INTRAMUSCULAR | Status: DC
  Administered 2016-08-18 – 2016-08-23 (×13): 5000 [IU] via SUBCUTANEOUS
  Filled 2016-08-18 (×13): qty 1

## 2016-08-18 MED ORDER — SODIUM CHLORIDE 0.9 % IV SOLN
INTRAVENOUS | Status: DC
  Administered 2016-08-19 – 2016-08-20 (×3): via INTRAVENOUS

## 2016-08-18 MED ORDER — CARVEDILOL 6.25 MG PO TABS
6.2500 mg | ORAL_TABLET | Freq: Two times a day (BID) | ORAL | Status: DC
  Administered 2016-08-19 – 2016-08-23 (×9): 6.25 mg via ORAL
  Filled 2016-08-18 (×9): qty 1

## 2016-08-18 MED ORDER — ATORVASTATIN CALCIUM 10 MG PO TABS
10.0000 mg | ORAL_TABLET | Freq: Every day | ORAL | Status: DC
  Administered 2016-08-19 – 2016-08-23 (×5): 10 mg via ORAL
  Filled 2016-08-18 (×6): qty 1

## 2016-08-18 MED ORDER — VANCOMYCIN HCL 10 G IV SOLR
1500.0000 mg | Freq: Once | INTRAVENOUS | Status: AC
  Administered 2016-08-18: 1500 mg via INTRAVENOUS
  Filled 2016-08-18: qty 1500

## 2016-08-18 MED ORDER — LEVETIRACETAM 500 MG PO TABS
500.0000 mg | ORAL_TABLET | Freq: Two times a day (BID) | ORAL | Status: DC
  Administered 2016-08-18 – 2016-08-23 (×10): 500 mg via ORAL
  Filled 2016-08-18 (×10): qty 1

## 2016-08-18 MED ORDER — VANCOMYCIN HCL IN DEXTROSE 1-5 GM/200ML-% IV SOLN: 1000.0000 mg | Freq: Once | INTRAVENOUS | Status: DC

## 2016-08-18 MED ORDER — SODIUM CHLORIDE 0.9 % IV BOLUS (SEPSIS)
1000.0000 mL | Freq: Once | INTRAVENOUS | Status: AC
  Administered 2016-08-18: 1000 mL via INTRAVENOUS

## 2016-08-18 MED ORDER — ACETAMINOPHEN 650 MG RE SUPP: 650.0000 mg | Freq: Four times a day (QID) | RECTAL | Status: DC | PRN

## 2016-08-18 MED ORDER — VANCOMYCIN HCL IN DEXTROSE 750-5 MG/150ML-% IV SOLN
750.0000 mg | INTRAVENOUS | Status: DC
  Filled 2016-08-18: qty 150

## 2016-08-18 MED ORDER — ONDANSETRON HCL 4 MG PO TABS: 4.0000 mg | ORAL_TABLET | Freq: Four times a day (QID) | ORAL | Status: DC | PRN

## 2016-08-18 NOTE — ED Triage Notes (Signed)
Patient from [place] office for weakness x3 days, and redness to both arms.  Right arm is also warm to touch.  Patient is confused at baseline but EMS reports family states patient is more confused that normal.  Patient appears agitated and states "I'm ready to go home".   CBG 225.

## 2016-08-18 NOTE — ED Notes (Signed)
Family at bedside. 

## 2016-08-18 NOTE — H&P (Signed)
History and Physical    JIVAN SYMANSKI XBD:[id number] DOB: 1941/01/05 DOA: 08/18/2016  Referring MD/NP/PA:  PCP: [name], NP Outpatient Specialists: [facility] Anon Patient coming from: home  Chief Complaint: fever  HPI: [name] is a 76 y.o. male with medical history significant of alcohol abuse, tobacco abuse, PAD, aneurysm s/p repair, and seizures.  Patient was seen by his PCP today as he developed a cold and a slight fever with cough a few days ago.  He was in the urgent care Friday and chest x ray was negative for PNA.  Yesterday he developed a "rash " on his upper arms b/l.  Patient lives alone and when his sister went to check on him, he was confused, weak and hanging half in his bed and half out of his bed.    Of note, patient recently had abdominal aortogram w/LE run off on 5/9 and was found to have a blockage and needed a stent to be placed this Wednesday (family said it was a 100% blockage).  He was started on a baby ASA.  Patient is following with wound care for non-healing left heel wound.  Family reports this has improved with wound care.    In the ER, his lactic acid was elevated-- he was given IVF and IV abx.  Cr was elevated from baseline as well.     Review of Systems: unable to do as patient would only respond no   Past Medical History:  Diagnosis Date  . Alcohol abuse   . Aneurysm ([place])   . CKD (chronic kidney disease), stage III   . COPD (chronic obstructive pulmonary disease) ([place])   . Hyperchloremia   . Hypercholesteremia   . Hyperpotassemia   . Hypertension   . Hypertensive renal disease, benign   . Seizures ([place])    has not had a seizure in 15 yrs  . Tobacco use     Past Surgical History:  Procedure Laterality Date  . ABDOMINAL AORTOGRAM W/LOWER EXTREMITY N/A 08/13/2016   Procedure: Abdominal Aortogram w/Lower Extremity;  Surgeon: Wellington Hampshire, MD;  Location: [facility];  Service: Cardiovascular;  Laterality: N/A;  . CEREBRAL ANEURYSM  REPAIR  Mar 23, 1996  . EYE SURGERY     December 2017  . PERIPHERAL VASCULAR BALLOON ANGIOPLASTY  08/13/2016   Procedure: Peripheral Vascular Balloon Angioplasty;  Surgeon: Wellington Hampshire, MD;  Location: [facility];  Service: Cardiovascular;;  Aborted     reports that he has quit smoking. He quit after 56.00 years of use. He has never used smokeless tobacco. He reports that he drinks alcohol. He reports that he does not use drugs.  Allergies  Allergen Reactions  . Diltiazem Rash and Other (See Comments)    Blisters  . Penicillins Other (See Comments)    Unknown Childhood allergy  Has patient had a PCN reaction causing immediate rash, facial/tongue/throat swelling, SOB or lightheadedness with hypotension: unknown Has patient had a PCN reaction causing severe rash involving mucus membranes or skin necrosis: unknown Has patient had a PCN reaction that required hospitalization unknown Has patient had a PCN reaction occurring within the last 10 years:no If all of the above answers are "NO", then may proceed with Cephalosporin use.     Family History  Problem Relation Age of Onset  . Cancer Mother   . Congestive Heart Failure Father   . Alzheimer's disease Sister   . Congestive Heart Failure Sister   . Clotting disorder Sister   .  Congestive Heart Failure Brother   . Clotting disorder Sister      Prior to Admission medications   Medication Sig Start Date End Date Taking? Authorizing Provider  aspirin EC 81 MG tablet Take 1 tablet (81 mg total) by mouth daily. 08/05/16  Yes Wellington Hampshire, MD  atorvastatin (LIPITOR) 10 MG tablet TAKE 1 TABLET BY MOUTH EVERY DAY 08/11/16  Yes [name], NP  carvedilol (COREG) 6.25 MG tablet TAKE 1 TABLET (6.25 MG TOTAL) BY MOUTH 2 (TWO) TIMES DAILY WITH A MEAL. 08/04/16  Yes Reed, Tiffany L, DO  [facility] ARTIFICIAL TEARS 1-0.3 % SOLN PLACE 1 DROP IN EACHEYE EVERY 6 HOURS AS NEEDED FOR DRY EYES 06/06/16  Yes [provider]  [facility] B-1  100 MG tablet TAKE 1 TABLET BY MOUTH EVERY DAY 08/06/16  Yes [name], NP  divalproex (DEPAKOTE) 250 MG DR tablet TAKE 1 TABLET BY MOUTH 3 TIMES A DAY FOR SEIZURES 07/03/16  Yes [name], NP  furosemide (LASIX) 40 MG tablet TAKE 1 TABLET BY MOUTH ONCE DAILY FOR EDEMA 08/06/16  Yes [name], NP  levETIRAcetam (KEPPRA) 500 MG tablet Take one tablet by mouth twice daily 07/03/16  Yes Eubanks, Carlos American, NP  LORazepam (ATIVAN) 0.5 MG tablet TAKE 1 TABLET BY MOUTH EVERY 12 HOURS 08/04/16  Yes [name], NP  Multiple Vitamins-Minerals (THEREMS-M) TABS Take 1 tablet by mouth daily. 07/03/16  Yes [name], NP    Physical Exam: Vitals:   08/18/16 1430 08/18/16 1445 08/18/16 1530 08/18/16 1545  BP: (!) 142/62 (!) 162/70 (!) 145/74 (!) 143/63  Pulse: 84 92 85 79  Resp: 18 (!) 23 19 18   Temp:      TempSrc:      SpO2: 100% 98% 96% 99%      Constitutional: chronically ill appearing Vitals:   08/18/16 1430 08/18/16 1445 08/18/16 1530 08/18/16 1545  BP: (!) 142/62 (!) 162/70 (!) 145/74 (!) 143/63  Pulse: 84 92 85 79  Resp: 18 (!) 23 19 18   Temp:      TempSrc:      SpO2: 100% 98% 96% 99%   Eyes: PERRL, lids and conjunctivae normal ENMT: Mucous membranes are moist. Posterior pharynx clear of any exudate or lesions.Normal dentition.  Neck: no nucal rigidity Respiratory: no wheezing, upper airway sounds, diminished Cardiovascular: Regular rate and rhythm, no murmurs / rubs / gallops. + extremity edema. Decreased pulses. No carotid bruits.  Abdomen: no tenderness, no masses palpated. No hepatosplenomegaly. Bowel sounds positive.  Skin: petechial rash on b/l arms; wound on left heel Neurologic: moved all 4 extremities Psychiatric: refused to cooperate, answered "no" for every question    Labs on Admission: I have personally reviewed following labs and imaging studies  CBC:  Recent Labs Lab 08/18/16 1348  WBC 13.1*  NEUTROABS 10.1*  HGB 7.9*  HCT  24.1*  MCV 87.0  PLT 010   Basic Metabolic Panel:  Recent Labs Lab 08/18/16 1348  NA 138  K 5.2*  CL 104  CO2 22  GLUCOSE 143*  BUN 41*  CREATININE 1.83*  CALCIUM 8.4*   GFR: Estimated Creatinine Clearance: 30.5 mL/min (A) (by C-G formula based on SCr of 1.83 mg/dL (H)). Liver Function Tests:  Recent Labs Lab 08/18/16 1348  AST 29  ALT 22  ALKPHOS 55  BILITOT 0.5  PROT 6.8  ALBUMIN 2.9*   No results for input(s): LIPASE, AMYLASE in the last 168 hours. No results for  input(s): AMMONIA in the last 168 hours. Coagulation Profile: No results for input(s): INR, PROTIME in the last 168 hours. Cardiac Enzymes: No results for input(s): CKTOTAL, CKMB, CKMBINDEX, TROPONINI in the last 168 hours. BNP (last 3 results) No results for input(s): PROBNP in the last 8760 hours. HbA1C: No results for input(s): HGBA1C in the last 72 hours. CBG: No results for input(s): GLUCAP in the last 168 hours. Lipid Profile: No results for input(s): CHOL, HDL, LDLCALC, TRIG, CHOLHDL, LDLDIRECT in the last 72 hours. Thyroid Function Tests: No results for input(s): TSH, T4TOTAL, FREET4, T3FREE, THYROIDAB in the last 72 hours. Anemia Panel: No results for input(s): VITAMINB12, FOLATE, FERRITIN, TIBC, IRON, RETICCTPCT in the last 72 hours. Urine analysis:    Component Value Date/Time   COLORURINE YELLOW 08/18/2016 1420   APPEARANCEUR CLEAR 08/18/2016 1420   LABSPEC 1.013 08/18/2016 1420   PHURINE 5.0 08/18/2016 1420   GLUCOSEU NEGATIVE 08/18/2016 1420   HGBUR NEGATIVE 08/18/2016 [place] 08/18/2016 1420   [place] 08/18/2016 1420   PROTEINUR NEGATIVE 08/18/2016 1420   NITRITE NEGATIVE 08/18/2016 [place] 08/18/2016 1420   Sepsis Labs: Invalid input(s): PROCALCITONIN, LACTICIDVEN No results found for this or any previous visit (from the past 240 hour(s)).   Radiological Exams on Admission: Dg Chest Port 1 View  Result Date:  08/18/2016 CLINICAL DATA:  Cough and weakness EXAM: PORTABLE CHEST 1 VIEW COMPARISON:  April 14, 2016 FINDINGS: There is a minimal left pleural effusion with mild left base atelectasis. Lungs elsewhere are clear. Heart is borderline enlarged with pulmonary vascularity within normal limits. There is aortic atherosclerosis. No adenopathy. No bone lesions. IMPRESSION: Minimal left pleural effusion with left base atelectasis. Lungs elsewhere clear. Borderline cardiac enlargement with aortic atherosclerosis. Electronically Signed   By: Lowella Grip III M.D.   On: 08/18/2016 13:42    EKG: Independently reviewed. Not done  Assessment/Plan Active Problems:   Seizures ([place])   Tobacco use disorder   Essential hypertension, benign   Hyperlipidemia LDL goal <130   PVD (peripheral vascular disease) (HCC)   CKD (chronic kidney disease), stage III   Fever  Weakness due to Fever/sepsis -culture -chest x ray and urine negative -? Foot wound vs viral illness: x ray of foot pending as well as NP swab  PVD -due for stent on Wed per family  Elevated lactic acid -recheck after IVF  AKI on CKD due to dehydration -? From dehydration vs dye used in procedure on 9th  h/o brain anuerysm and seizures -resume home meds  Alcohol/tobacco abuse -patient has stopped  Hyperkalemia -recheck in AM after IVF  Will place patient in SDU for 24 hours- if improving in AM, can be transferred to the floor   DVT prophylaxis: heparin Code Status: DNR (discussed in great detail with MPOA in ER-- she does want him treated, no intubation/CPR) Family Communication: at bedside (cousin./sister Disposition Plan:  Consults called:  Admission status: sdu   [facility] Pager [phone]  If 7PM-7AM, please contact night-coverage [email or web address] Password [email or web address]  08/18/2016, 4:21 PM

## 2016-08-18 NOTE — Progress Notes (Signed)
Careteam: Patient Care Team: [name], NP as PCP - General (Nurse Practitioner)  Advanced Directive information Does Patient Have a Medical Advance Directive?: No  Allergies  Allergen Reactions  . Diltiazem Rash and Other (See Comments)    Blisters  . Penicillins Other (See Comments)    Unknown Childhood allergy  Has patient had a PCN reaction causing immediate rash, facial/tongue/throat swelling, SOB or lightheadedness with hypotension: unknown Has patient had a PCN reaction causing severe rash involving mucus membranes or skin necrosis: unknown Has patient had a PCN reaction that required hospitalization unknown Has patient had a PCN reaction occurring within the last 10 years:no If all of the above answers are "NO", then may proceed with Cephalosporin use.     Chief Complaint  Patient presents with  . Acute Visit    Pt is being seen due to cold symptoms, weakness, memory loss, no appetite     HPI: Patient is a 76 y.o. male seen in the office today due to cold with slight fever. Pt here today with cousin who is his POA. She went ahead and took him to urgent care to make sure he did not have pneumonia. xrays were negative for pneumonia and she was instructed to use cough medication PRN. They have been using delsym.  Pt had cough, congestion, runny nose and temp of 100.3. Cousin reports cough, congestion have improved since Friday however he had a temp of 100.3 yesterday.  Has been having tremors which has increased over the last few days. Worse today. Arms are red and warm.  Pt lives alone, he has been home all weekend and not been getting out of the bed.  Sister went over 2 days ago and he ate dinner, unsure if he ate or drank anything yesterday but this morning ate breakfast.  He has been very confused and lethargic which has been progressively worsening over the last few days. Profoundly weak. Unable to stand.   Cousin notes he has not been smoking his  cigarettes   Pt went in for Abdominal Aortogram w/Lower Extremity on 08/13/16 due to PAD and he has not been doing well since.  Review of Systems:  Pt unable to provide ROS, provided by family  Review of Systems  Unable to perform ROS: Acuity of condition  Constitutional: Positive for activity change, appetite change, chills and fatigue.  Skin: Positive for color change (redness to bilateral arms).  Neurological: Positive for tremors and weakness.  Psychiatric/Behavioral: Positive for confusion.   Past Medical History:  Diagnosis Date  . Alcohol abuse   . Aneurysm ([place])   . CKD (chronic kidney disease), stage III   . COPD (chronic obstructive pulmonary disease) ([place])   . Hyperchloremia   . Hypercholesteremia   . Hyperpotassemia   . Hypertension   . Hypertensive renal disease, benign   . Seizures ([place])    has not had a seizure in 15 yrs  . Tobacco use    Past Surgical History:  Procedure Laterality Date  . ABDOMINAL AORTOGRAM W/LOWER EXTREMITY N/A 08/13/2016   Procedure: Abdominal Aortogram w/Lower Extremity;  Surgeon: Wellington Hampshire, MD;  Location: [facility];  Service: Cardiovascular;  Laterality: N/A;  . 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, 1996  . EYE SURGERY     December 2017  . PERIPHERAL VASCULAR BALLOON ANGIOPLASTY  08/13/2016   Procedure: Peripheral Vascular Balloon Angioplasty;  Surgeon: Wellington Hampshire, MD;  Location: [facility];  Service: Cardiovascular;;  Aborted   Social History:   reports that he has quit smoking. He quit after 56.00 years of use. He has never used smokeless tobacco. He reports that he drinks alcohol. He reports that he does not use drugs.  Family History  Problem Relation Age of Onset  . Cancer Mother   . Congestive Heart Failure Father   . Alzheimer's disease Sister   . Congestive Heart Failure Sister   . Clotting disorder Sister   . Congestive Heart Failure Brother   . Clotting disorder Sister     Medications: Patient's  Medications  New Prescriptions   No medications on file  Previous Medications   ASPIRIN EC 81 MG TABLET    Take 1 tablet (81 mg total) by mouth daily.   ATORVASTATIN (LIPITOR) 10 MG TABLET    TAKE 1 TABLET BY MOUTH EVERY DAY   CARVEDILOL (COREG) 6.25 MG TABLET    TAKE 1 TABLET (6.25 MG TOTAL) BY MOUTH 2 (TWO) TIMES DAILY WITH A MEAL.   [facility] ARTIFICIAL TEARS 1-0.3 % SOLN    PLACE 1 DROP IN EACHEYE EVERY 6 HOURS AS NEEDED FOR DRY EYES   [facility] B-1 100 MG TABLET    TAKE 1 TABLET BY MOUTH EVERY DAY   DIVALPROEX (DEPAKOTE) 250 MG DR TABLET    TAKE 1 TABLET BY MOUTH 3 TIMES A DAY FOR SEIZURES   FUROSEMIDE (LASIX) 40 MG TABLET    TAKE 1 TABLET BY MOUTH ONCE DAILY FOR EDEMA   LEVETIRACETAM (KEPPRA) 500 MG TABLET    Take one tablet by mouth twice daily   LORAZEPAM (ATIVAN) 0.5 MG TABLET    TAKE 1 TABLET BY MOUTH EVERY 12 HOURS   MULTIPLE VITAMINS-MINERALS (THEREMS-M) TABS    Take 1 tablet by mouth daily.  Modified Medications   No medications on file  Discontinued Medications   No medications on file     Physical Exam:  Vitals:   08/18/16 1143  BP: 128/84  Pulse: 62  Resp: 16  Temp: 98.5 F (36.9 C)  TempSrc: Oral  SpO2: 95%   There is no height or weight on file to calculate BMI.  Physical Exam  Constitutional: He appears lethargic. He appears ill. No distress.  thin appearing  HENT:  Mouth/Throat: Oropharynx is clear and moist. No oropharyngeal exudate.  Cardiovascular: Normal rate, regular rhythm and normal heart sounds.   Pulmonary/Chest: Effort normal and breath sounds normal. No respiratory distress.  Abdominal: Soft. Bowel sounds are normal.  Musculoskeletal: Normal range of motion. He exhibits edema (2+ to left leg, 1+ to right leg).  Neurological: He appears lethargic.  Skin: Skin is warm. No rash noted. There is erythema (noted to bilateral arms).  Psychiatric: His affect is labile. Cognition and memory are impaired. He exhibits abnormal recent memory.    Labs  reviewed: Basic Metabolic Panel:  Recent Labs  04/14/16 1612  04/25/16 2009 06/19/16 1051 08/07/16 1200  NA 140  < > 138 144 138  K 3.8  < > 4.4 4.5 4.7  CL 103  < > 108 107 104  CO2 26  < > 23 29 32*  GLUCOSE 156*  < > 109* 84 93  BUN 15  < > 23* 16 25  CREATININE 1.41*  < > 1.36* 1.10 1.40*  CALCIUM 9.3  < > 7.9* 9.0 9.8  MG 1.9  --   --   --   --   TSH 1.992  --   --   --   --   < > =  values in this interval not displayed. Liver Function Tests:  Recent Labs  01/24/16 1115 04/25/16 2009 06/19/16 1051  AST 18 33 13  ALT 8* 30 6*  ALKPHOS 79 81 64  BILITOT 0.3 0.8 0.3  PROT 6.8 5.5* 6.7  ALBUMIN 4.0 2.1* 3.3*   No results for input(s): LIPASE, AMYLASE in the last 8760 hours. No results for input(s): AMMONIA in the last 8760 hours. CBC:  Recent Labs  04/19/16 0548 04/25/16 2009 06/19/16 1051 08/07/16 1200  WBC 9.8 14.5* 5.5 5.1  NEUTROABS  --  9.3* 2,035 1.8  HGB 11.3* 8.7* 9.5*  --   HCT 31.9* 26.2* 30.2* 35.3*  MCV 85.5 90.3 91.5 84  PLT 113* 180 218 189   Lipid Panel:  Recent Labs  10/15/15 1016 04/15/16 0211  CHOL 217* 164  HDL 107 103  LDLCALC 97 51  TRIG 64 48  CHOLHDL 2.0 1.6   TSH:  Recent Labs  04/14/16 1612  TSH 1.992   A1C: Lab Results  Component Value Date   HGBA1C 5.2 04/15/2016     Assessment/Plan 1. Weakness Profoundly weak over the last 3 days. At baseline pt is alert and very interactive, now with lethargy and confusion. Now unable to stand, incontinent of urine and unable to care for himself. Suspect he is severely dehydrated at this time, possible UTI/infectious process due to elevated temperature.   2. Cough With congestion, has improved at this time but   3. PAD (peripheral artery disease) (HCC) Was referred to cardiology, ABI of 0.52 on the left with significant left common and external iliac artery stenosis with velocity greater than 350 and an occluded left mid SFA. Pt with nonhealing left heel pressure ulcer  followed by wound care. ulcer has improved but will not completely heal without improved blood flow. Lower extremity angiogram was scheduled for 5/16   Due to progressive decline with profound weakness will send to hospital for further evaluation and treatment.   Carlos American. Harle Battiest  [facility] [phone] 8 am - 5 pm) [phone] (after hours)

## 2016-08-18 NOTE — Progress Notes (Signed)
Pharmacy Antibiotic Note  [name] is a 76 y.o. male admitted on 08/18/2016 with wound infection.  Pharmacy has been consulted for vancomycin and cefepime dosing. Tmax 100, WBC elevated at 13.1, lactic acid 3.05, CrCl ~ 30 ml/min  Plan: Vancomycin 1500 mg x1 then 750 mg IV every 24 hours.  Goal trough 10-15 mcg/mL.  Cefepime 2g IV every 24 hours Monitor renal function, clinical status, cultures, ability to de-escalate     Temp (24hrs), Avg:99.3 F (37.4 C), Min:98.5 F (36.9 C), Max:100 F (37.8 C)   Recent Labs Lab 08/18/16 1348 08/18/16 1401  WBC 13.1*  --   CREATININE 1.83*  --   LATICACIDVEN  --  3.05*    Estimated Creatinine Clearance: 30.5 mL/min (A) (by C-G formula based on SCr of 1.83 mg/dL (H)).    Allergies  Allergen Reactions  . Diltiazem Rash and Other (See Comments)    Blisters  . Penicillins Other (See Comments)    Unknown Childhood allergy  Has patient had a PCN reaction causing immediate rash, facial/tongue/throat swelling, SOB or lightheadedness with hypotension: unknown Has patient had a PCN reaction causing severe rash involving mucus membranes or skin necrosis: unknown Has patient had a PCN reaction that required hospitalization unknown Has patient had a PCN reaction occurring within the last 10 years:no If all of the above answers are "NO", then may proceed with Cephalosporin use.     Antimicrobials this admission: 5/14 vanc>> 5/14 cefepime>>  Dose adjustments this admission: n/a  Microbiology results: 5/14 BCx: sent  Thank you for allowing pharmacy to be a part of this patient's care.   Gwenlyn Perking, PharmD PGY1 Pharmacy Resident Rx ED [phone] 08/18/2016 3:26 PM

## 2016-08-18 NOTE — ED Provider Notes (Addendum)
[facility] DEPT Provider Note   CSN: [id number] Arrival date & time: 08/18/16  1309     History   Chief Complaint Chief Complaint  Patient presents with  . Weakness    HPI NICKALAUS CROOKE is a 76 y.o. male.  Patient with remote hx etoh abuse, copd, ckd, htn, with generalized weakness in the past several days, associated with non prod cough, low grade fever, urinary incontinence.  Symptoms moderate, persistent, worse in past day. Compliant w normal home meds. No recent change in meds or antibiotic therapy. Family also notes erythematous lesions to bilateral arms and trunk in past 1-2 days. Pt very poor historian, ?dementia, level 5 caveat - states everything is fine and he is ready to go.     The history is provided by the patient and a relative. The history is limited by the condition of the patient.  Weakness  Pertinent negatives include no shortness of breath, no chest pain, no vomiting, no confusion and no headaches.    Past Medical History:  Diagnosis Date  . Alcohol abuse   . Aneurysm ([place])   . CKD (chronic kidney disease), stage III   . COPD (chronic obstructive pulmonary disease) ([place])   . Hyperchloremia   . Hypercholesteremia   . Hyperpotassemia   . Hypertension   . Hypertensive renal disease, benign   . Seizures ([place])    has not had a seizure in 15 yrs  . Tobacco use     Patient Active Problem List   Diagnosis Date Noted  . Atrial fibrillation with RVR ([place]) 04/14/2016  . Chronic diastolic heart failure ([place]) 04/14/2016  . Weakness 04/14/2016  . Elevated troponin 04/14/2016  . Fall 04/14/2016  . Alcohol abuse 04/14/2016  . CKD (chronic kidney disease), stage III   . PVD (peripheral vascular disease) ([place]) 03/07/2014  . Cognitive impairment 03/07/2014  . Essential hypertension, benign 02/02/2013  . Routine general medical examination at a health care facility 02/02/2013  . Need for prophylactic vaccination and inoculation against influenza 02/02/2013    . Hyperlipidemia LDL goal <130 02/02/2013  . Hypertensive renal disease 02/02/2013  . Seizures ([place]) 07/28/2012  . Tobacco use disorder 07/28/2012    Past Surgical History:  Procedure Laterality Date  . ABDOMINAL AORTOGRAM W/LOWER EXTREMITY N/A 08/13/2016   Procedure: Abdominal Aortogram w/Lower Extremity;  Surgeon: Wellington Hampshire, MD;  Location: [facility];  Service: Cardiovascular;  Laterality: N/A;  . 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, 1996  . EYE SURGERY     December 2017  . PERIPHERAL VASCULAR BALLOON ANGIOPLASTY  08/13/2016   Procedure: Peripheral Vascular Balloon Angioplasty;  Surgeon: Wellington Hampshire, MD;  Location: [facility];  Service: Cardiovascular;;  Aborted       Home Medications    Prior to Admission medications   Medication Sig Start Date End Date Taking? Authorizing Provider  aspirin EC 81 MG tablet Take 1 tablet (81 mg total) by mouth daily. 08/05/16   Wellington Hampshire, MD  atorvastatin (LIPITOR) 10 MG tablet TAKE 1 TABLET BY MOUTH EVERY DAY 08/11/16   [name], NP  carvedilol (COREG) 6.25 MG tablet TAKE 1 TABLET (6.25 MG TOTAL) BY MOUTH 2 (TWO) TIMES DAILY WITH A MEAL. 08/04/16   Reed, Tiffany L, DO  [facility] ARTIFICIAL TEARS 1-0.3 % SOLN PLACE 1 DROP IN EACHEYE EVERY 6 HOURS AS NEEDED FOR DRY EYES 06/06/16   [provider]  [facility] B-1 100 MG tablet TAKE 1 TABLET BY  MOUTH EVERY DAY 08/06/16   [name], NP  divalproex (DEPAKOTE) 250 MG DR tablet TAKE 1 TABLET BY MOUTH 3 TIMES A DAY FOR SEIZURES 07/03/16   [name], NP  furosemide (LASIX) 40 MG tablet TAKE 1 TABLET BY MOUTH ONCE DAILY FOR EDEMA 08/06/16   [name], NP  levETIRAcetam (KEPPRA) 500 MG tablet Take one tablet by mouth twice daily 07/03/16   [name], NP  LORazepam (ATIVAN) 0.5 MG tablet TAKE 1 TABLET BY MOUTH EVERY 12 HOURS 08/04/16   [name], NP  Multiple Vitamins-Minerals (THEREMS-M) TABS Take 1 tablet by mouth daily. 07/03/16   [name], NP    Family History Family History  Problem Relation Age of Onset  . Cancer Mother   . Congestive Heart Failure Father   . Alzheimer's disease Sister   . Congestive Heart Failure Sister   . Clotting disorder Sister   . Congestive Heart Failure Brother   . Clotting disorder Sister     Social History Social History  Substance Use Topics  . Smoking status: Former Smoker    Years: 56.00  . Smokeless tobacco: Never Used     Comment: On average about 6 cig daily   . Alcohol use 0.0 oz/week     Comment: 2-3 beer a day     Allergies   Diltiazem and Penicillins   Review of Systems Review of Systems  Constitutional: Positive for fever.  HENT: Negative for sore throat.   Eyes: Negative for redness.  Respiratory: Positive for cough. Negative for shortness of breath.   Cardiovascular: Negative for chest pain.  Gastrointestinal: Negative for abdominal pain, diarrhea and vomiting.  Genitourinary: Negative for dysuria.  Musculoskeletal: Negative for back pain, neck pain and neck stiffness.  Skin: Positive for rash.  Neurological: Positive for weakness. Negative for headaches.  Hematological: Does not bruise/bleed easily.  Psychiatric/Behavioral: Negative for confusion.     Physical Exam Updated Vital Signs BP (!) 156/96 (BP Location: Left Arm)   Pulse 97   Temp 100 F (37.8 C) (Oral)   Resp 14   SpO2 100%   Physical Exam  Constitutional: He appears well-developed and well-nourished. No distress.  HENT:  Head: Atraumatic.  Mouth/Throat: Oropharynx is clear and moist.  Eyes: Conjunctivae are normal. Pupils are equal, round, and reactive to light.  Neck: Neck supple. No tracheal deviation present.  No stiffness or rigidity.   Cardiovascular: Normal rate, regular rhythm, normal heart sounds and intact distal pulses.  Exam reveals no gallop and no friction rub.   No murmur heard. Pulmonary/Chest: Effort normal and breath sounds normal. No accessory muscle  usage. No respiratory distress.  Abdominal: Soft. Bowel sounds are normal. He exhibits no distension. There is no tenderness.  Genitourinary:  Genitourinary Comments: No cva tenderness.  Brown stool, sent for hemoccult.   Musculoskeletal: He exhibits no edema.  Chronic ulcer left heel without malodor, or purulent drainage.   Neurological: He is alert.  Awake and alert. Mild confusion. Motor intact bil. sens grossly intact.   Skin: Skin is warm and dry. He is not diaphoretic.  Diffuse erythematous dermatitis to bil arms, w sparse involvement on trunk.   Psychiatric: He has a normal mood and affect.  Nursing note and vitals reviewed.    ED Treatments / Results  Labs (all labs ordered are listed, but only abnormal results are displayed) Results for orders placed or performed during the hospital encounter of 08/18/16  Comprehensive metabolic panel  Result Value Ref Range   Sodium 138 135 - 145 mmol/L   Potassium 5.2 (H) 3.5 - 5.1 mmol/L   Chloride 104 101 - 111 mmol/L   CO2 22 22 - 32 mmol/L   Glucose, Bld 143 (H) 65 - 99 mg/dL   BUN 41 (H) 6 - 20 mg/dL   Creatinine, Ser 1.83 (H) 0.61 - 1.24 mg/dL   Calcium 8.4 (L) 8.9 - 10.3 mg/dL   Total Protein 6.8 6.5 - 8.1 g/dL   Albumin 2.9 (L) 3.5 - 5.0 g/dL   AST 29 15 - 41 U/L   ALT 22 17 - 63 U/L   Alkaline Phosphatase 55 38 - 126 U/L   Total Bilirubin 0.5 0.3 - 1.2 mg/dL   GFR calc non Af Amer 34 (L) >60 mL/min   GFR calc Af Amer 39 (L) >60 mL/min   Anion gap 12 5 - 15  CBC WITH DIFFERENTIAL  Result Value Ref Range   WBC 13.1 (H) 4.0 - 10.5 K/uL   RBC 2.77 (L) 4.22 - 5.81 MIL/uL   Hemoglobin 7.9 (L) 13.0 - 17.0 g/dL   HCT 24.1 (L) 39.0 - 52.0 %   MCV 87.0 78.0 - 100.0 fL   MCH 28.5 26.0 - 34.0 pg   MCHC 32.8 30.0 - 36.0 g/dL   RDW 16.1 (H) 11.5 - 15.5 %   Platelets 189 150 - 400 K/uL   Neutrophils Relative % 77 %   Lymphocytes Relative 15 %   Monocytes Relative 3 %   Eosinophils Relative 4 %   Basophils Relative 1 %    Neutro Abs 10.1 (H) 1.7 - 7.7 K/uL   Lymphs Abs 2.0 0.7 - 4.0 K/uL   Monocytes Absolute 0.4 0.1 - 1.0 K/uL   Eosinophils Absolute 0.5 0.0 - 0.7 K/uL   Basophils Absolute 0.1 0.0 - 0.1 K/uL   RBC Morphology ELLIPTOCYTES    WBC Morphology MILD LEFT SHIFT (1-5% METAS, OCC MYELO, OCC BANDS)   Urinalysis, Routine w reflex microscopic  Result Value Ref Range   Color, Urine YELLOW YELLOW   APPearance CLEAR CLEAR   Specific Gravity, Urine 1.013 1.005 - 1.030   pH 5.0 5.0 - 8.0   Glucose, UA NEGATIVE NEGATIVE mg/dL   Hgb urine dipstick NEGATIVE NEGATIVE   Bilirubin Urine NEGATIVE NEGATIVE   Ketones, ur NEGATIVE NEGATIVE mg/dL   Protein, ur NEGATIVE NEGATIVE mg/dL   Nitrite NEGATIVE NEGATIVE   Leukocytes, UA NEGATIVE NEGATIVE  I-Stat CG4 Lactic Acid, ED  (not at  [facility])  Result Value Ref Range   Lactic Acid, Venous 3.05 (HH) 0.5 - 1.9 mmol/L   Comment NOTIFIED PHYSICIAN    Dg Chest Port 1 View  Result Date: 08/18/2016 CLINICAL DATA:  Cough and weakness EXAM: PORTABLE CHEST 1 VIEW COMPARISON:  April 14, 2016 FINDINGS: There is a minimal left pleural effusion with mild left base atelectasis. Lungs elsewhere are clear. Heart is borderline enlarged with pulmonary vascularity within normal limits. There is aortic atherosclerosis. No adenopathy. No bone lesions. IMPRESSION: Minimal left pleural effusion with left base atelectasis. Lungs elsewhere clear. Borderline cardiac enlargement with aortic atherosclerosis. Electronically Signed   By: Lowella Grip III M.D.   On: 08/18/2016 13:42    EKG  EKG Interpretation None       Radiology Dg Chest Port 1 View  Result Date: 08/18/2016 CLINICAL DATA:  Cough and weakness EXAM: PORTABLE CHEST 1 VIEW COMPARISON:  April 14, 2016 FINDINGS: There is a minimal left pleural effusion  with mild left base atelectasis. Lungs elsewhere are clear. Heart is borderline enlarged with pulmonary vascularity within normal limits. There is aortic atherosclerosis. No  adenopathy. No bone lesions. IMPRESSION: Minimal left pleural effusion with left base atelectasis. Lungs elsewhere clear. Borderline cardiac enlargement with aortic atherosclerosis. Electronically Signed   By: Lowella Grip III M.D.   On: 08/18/2016 13:42    Procedures Procedures (including critical care time)  Medications Ordered in ED Medications - No data to display   Initial Impression / Assessment and Plan / ED Course  I have reviewed the triage vital signs and the nursing notes.  Pertinent labs & imaging results that were available during my care of the patient were reviewed by me and considered in my medical decision making (see chart for details).  Labs and cultures sent.   Pcxr.  Ua.    Reviewed nursing notes and prior charts for additional history.   Iv ns bolus.   Chronic foot wound is possible source of infection. cxr without acute pna, urine neg.  Cultures sent. Iv abx given.  Given fever, weakness, failure to thrive, elevated lactate, aki, anemia, will admit.   hgb lower than prior, will send for hemoccult.  Medicine service consulted for admission.     Final Clinical Impressions(s) / ED Diagnoses   Final diagnoses:  None    New Prescriptions New Prescriptions   No medications on file         Lajean Saver, MD 08/18/16 1525

## 2016-08-19 ENCOUNTER — Encounter (HOSPITAL_COMMUNITY): Payer: Self-pay | Admitting: General Practice

## 2016-08-19 ENCOUNTER — Observation Stay (HOSPITAL_COMMUNITY): Payer: Medicare Other

## 2016-08-19 DIAGNOSIS — N183 Chronic kidney disease, stage 3 (moderate): Secondary | ICD-10-CM | POA: Diagnosis not present

## 2016-08-19 DIAGNOSIS — S91309D Unspecified open wound, unspecified foot, subsequent encounter: Secondary | ICD-10-CM

## 2016-08-19 DIAGNOSIS — R569 Unspecified convulsions: Secondary | ICD-10-CM

## 2016-08-19 DIAGNOSIS — D72828 Other elevated white blood cell count: Secondary | ICD-10-CM | POA: Diagnosis not present

## 2016-08-19 DIAGNOSIS — R531 Weakness: Secondary | ICD-10-CM | POA: Diagnosis not present

## 2016-08-19 DIAGNOSIS — F17200 Nicotine dependence, unspecified, uncomplicated: Secondary | ICD-10-CM | POA: Diagnosis not present

## 2016-08-19 DIAGNOSIS — S91302A Unspecified open wound, left foot, initial encounter: Secondary | ICD-10-CM | POA: Diagnosis not present

## 2016-08-19 DIAGNOSIS — I10 Essential (primary) hypertension: Secondary | ICD-10-CM

## 2016-08-19 DIAGNOSIS — J440 Chronic obstructive pulmonary disease with acute lower respiratory infection: Secondary | ICD-10-CM | POA: Diagnosis not present

## 2016-08-19 DIAGNOSIS — I739 Peripheral vascular disease, unspecified: Secondary | ICD-10-CM | POA: Diagnosis not present

## 2016-08-19 DIAGNOSIS — E785 Hyperlipidemia, unspecified: Secondary | ICD-10-CM

## 2016-08-19 DIAGNOSIS — R7989 Other specified abnormal findings of blood chemistry: Secondary | ICD-10-CM | POA: Diagnosis not present

## 2016-08-19 DIAGNOSIS — N179 Acute kidney failure, unspecified: Secondary | ICD-10-CM | POA: Diagnosis not present

## 2016-08-19 LAB — BASIC METABOLIC PANEL
Anion gap: 8 (ref 5–15)
BUN: 34 mg/dL — ABNORMAL HIGH (ref 6–20)
CHLORIDE: 110 mmol/L (ref 101–111)
CO2: 22 mmol/L (ref 22–32)
CREATININE: 1.29 mg/dL — AB (ref 0.61–1.24)
Calcium: 8.2 mg/dL — ABNORMAL LOW (ref 8.9–10.3)
GFR, EST NON AFRICAN AMERICAN: 52 mL/min — AB (ref >60)
Glucose, Bld: 102 mg/dL — ABNORMAL HIGH (ref 65–99)
POTASSIUM: 4.6 mmol/L (ref 3.5–5.1)
Sodium: 140 mmol/L (ref 135–145)

## 2016-08-19 LAB — VITAMIN B12: Vitamin B-12: 722 pg/mL (ref 180–914)

## 2016-08-19 LAB — LACTIC ACID, PLASMA
Lactic Acid, Venous: 1 mmol/L (ref 0.5–1.9)
Lactic Acid, Venous: 1.2 mmol/L (ref 0.5–1.9)

## 2016-08-19 LAB — CBC
HCT: 23.1 % — ABNORMAL LOW (ref 39.0–52.0)
Hemoglobin: 7.7 g/dL — ABNORMAL LOW (ref 13.0–17.0)
MCH: 29.1 pg (ref 26.0–34.0)
MCHC: 33.3 g/dL (ref 30.0–36.0)
MCV: 87.2 fL (ref 78.0–100.0)
PLATELETS: 149 10*3/uL — AB (ref 150–400)
RBC: 2.65 MIL/uL — AB (ref 4.22–5.81)
RDW: 16.2 % — ABNORMAL HIGH (ref 11.5–15.5)
WBC: 11.7 10*3/uL — AB (ref 4.0–10.5)

## 2016-08-19 LAB — IRON AND TIBC
IRON: 22 ug/dL — AB (ref 45–182)
Saturation Ratios: 14 % — ABNORMAL LOW (ref 17.9–39.5)
TIBC: 160 ug/dL — ABNORMAL LOW (ref 250–450)
UIBC: 138 ug/dL

## 2016-08-19 LAB — TSH: TSH: 3.541 u[IU]/mL (ref 0.350–4.500)

## 2016-08-19 LAB — FERRITIN: Ferritin: 288 ng/mL (ref 24–336)

## 2016-08-19 MED ORDER — VANCOMYCIN HCL 500 MG IV SOLR
500.0000 mg | INTRAVENOUS | Status: DC
  Administered 2016-08-20: 500 mg via INTRAVENOUS
  Filled 2016-08-19 (×2): qty 500

## 2016-08-19 NOTE — ED Notes (Signed)
Attempted to call report

## 2016-08-19 NOTE — Care Management Obs Status (Signed)
[facility] NOTIFICATION   Patient Details  Name: [name] MRN: [id number] Date of Birth: 1940-09-12   Medicare Observation Status Notification Given:  Yes    Erenest Rasher, RN 08/19/2016, 4:59 PM

## 2016-08-19 NOTE — Progress Notes (Signed)
[name] [id number] Admission Data: 08/19/2016 1:28 PM Attending Provider: Damita Lack, MD  [id number], Carlos American, NP Consults/ Treatment Team:   [name] is a 76 y.o. male patient admitted from ED awake, alert  & orientated  X 3,  DNR, VSS - Blood pressure (!) 129/36, pulse 70, temperature 98.4 F (36.9 C), temperature source Oral, resp. rate 16, SpO2 98 %., . Tele # [id number] placed    IV site WDL:  forearm left, condition patent and no redness with a transparent dsg that's clean dry and intact.  Allergies:   Allergies  Allergen Reactions  . Diltiazem Rash and Other (See Comments)    Blisters  . Penicillins Other (See Comments)    Unknown Childhood allergy  Has patient had a PCN reaction causing immediate rash, facial/tongue/throat swelling, SOB or lightheadedness with hypotension: unknown Has patient had a PCN reaction causing severe rash involving mucus membranes or skin necrosis: unknown Has patient had a PCN reaction that required hospitalization unknown Has patient had a PCN reaction occurring within the last 10 years:no If all of the above answers are "NO", then may proceed with Cephalosporin use.      Past Medical History:  Diagnosis Date  . Alcohol abuse   . Aneurysm ([place])   . CKD (chronic kidney disease), stage III   . COPD (chronic obstructive pulmonary disease) ([place])   . Fever of unknown origin 08/2016  . Hyperchloremia   . Hypercholesteremia   . Hyperpotassemia   . Hypertension   . Hypertensive renal disease, benign   . Seizures ([place])    has not had a seizure in 15 yrs  . Tobacco use     History:  obtained from sibling.  Pt orientation to unit, room and routine. Information packet given to patient/family and safety video watched.  Admission INP armband ID verified with patient/family, and in place. SR up x 2, fall risk assessment complete with Patient and family verbalizing understanding of risks associated with falls. Patient has blister on R  plantar surface and an old L heel wound that is callused now.    Will cont to monitor and assist as needed.  Janyra Barillas Margaretha Sheffield, RN 08/19/2016 1:28 PM

## 2016-08-19 NOTE — Progress Notes (Signed)
Called to get report from [facility]

## 2016-08-19 NOTE — Consult Note (Signed)
[facility] Nurse wound consult note Reason for Consult: Consult requested for left heel wound.  Pt states he is followed by the outpatient wound care center and wound has greatly improved.  He has a foam protective cup to the site at this time without further topical treatment. Wound type: Left foot with generalized edema and erythremia. Previous full thickness wound is now closed, dry callous area.  Affected area to posterior heel is 1X1cm, dry peeling skin removes easily, surrounding slightly raised area, no open wound, drainage, or fluctuance.  Left plantar foot with dark purple deep tissue injury; 3X2cm with intact skin.  Pt and family member state they were not aware of this site Pressure Injury POA: Yes Dressing procedure/placement/frequency: No topical treatment is indicated at this time.  Float heel to reduce pressure.  Foam cup to protect from further injury.  Pt can resume follow-up at the outpatient wound care center after discharge.  Discussed plan of care with patient and family member at the bedside and they deny further questions. Please re-consult if further assistance is needed.  Thank-you,  Julien Girt MSN, [place], [place], [place], [place]

## 2016-08-19 NOTE — Progress Notes (Signed)
Pharmacy Antibiotic Note  [name] is a 76 y.o. male admitted on 08/18/2016 with wound infection.  Pharmacy consulted on 08/18/16 for vancomycin and cefepime dosing.  Tmax 100, Afebrile currently, WBC elevated, trending down 13.1> 11.7, lactic acid 3.05 trended down to 1.0.   He has h/o CKD stage 3:  SCr 1.83>improved to 1.29, CrCl ~ 37 ml/min Weight = 55 kg . However, yesterday the previous wt on 5/9 recorded as 160 lbs  =72.5kg.  On 5/1 the weight was 123 lbs 3.2oz =  55.8kg.  Thus, I will decrease IV Vancomycin from 750mg  q24h to 500 mg q24h. Since pt received a 1500 mg loading dose yesterday 5/14 @ 16:00, I will move next dose to tomorrow AM to give more time to clear loading dose.  Cultures are no growth to date.  TRH notes that they will likely give only 60more day of antibiotics.   Plan: Decrease Vancomycin  to 500 mg IV q24h -next due tomorrow AM  Continue Cefepime 2g IV every 24 hours Monitor renal function, clinical status, cultures, ability to de-escalate  Height: 5\' 6"  (167.6 cm) Weight: 122 lb 5.7 oz (55.5 kg) IBW/kg (Calculated) : 63.8  Temp (24hrs), Avg:99.4 F (37.4 C), Min:98.4 F (36.9 C), Max:100.4 F (38 C)   Recent Labs Lab 08/18/16 1348 08/18/16 1401 08/18/16 1708 08/18/16 2150 08/18/16 2355 08/19/16 0454  WBC 13.1*  --   --   --   --  11.7*  CREATININE 1.83*  --   --   --   --  1.29*  LATICACIDVEN  --  3.05* 1.13 2.1* 1.2 1.0    Estimated Creatinine Clearance: 37.6 mL/min (A) (by C-G formula based on SCr of 1.29 mg/dL (H)).    Allergies  Allergen Reactions  . Diltiazem Rash and Other (See Comments)    Blisters  . Penicillins Other (See Comments)    Unknown Childhood allergy  Has patient had a PCN reaction causing immediate rash, facial/tongue/throat swelling, SOB or lightheadedness with hypotension: unknown Has patient had a PCN reaction causing severe rash involving mucus membranes or skin necrosis: unknown Has patient had a PCN reaction that  required hospitalization unknown Has patient had a PCN reaction occurring within the last 10 years:no If all of the above answers are "NO", then may proceed with Cephalosporin use.     Antimicrobials this admission: 5/14 vanc>> 5/14 cefepime>>  Dose adjustments this admission: n/a  Microbiology results:  5 /14 BCx X2: ngtd <24 hr 5/14 Resp panel PCR: negative   Thank you for allowing pharmacy to be a part of this patient's care. Nicole Cella, RPh Clinical Pharmacist [phone] [place] [phone] 08/19/2016 3:28 PM

## 2016-08-19 NOTE — Care Management CC44 (Signed)
Condition Code 44 Documentation Completed  Patient Details  Name: DONLEY HARLAND MRN: [id number] Date of Birth: 11-28-1940   Condition Code 44 given:  Yes Patient signature on Condition Code 44 notice:  Yes Documentation of 2 MD's agreement:  Yes Code 44 added to claim:  Yes    Erenest Rasher, RN 08/19/2016, 4:59 PM

## 2016-08-19 NOTE — Progress Notes (Addendum)
PROGRESS NOTE    [name]  QIO:[id number] DOB: [birth year] DOA: 08/18/2016 PCP: [name], NP   Brief Narrative:  76 year old with past medical history of alcohol abuse, tobacco abuse, peripheral artery disease, aneurysm status post repair and seizures was sent to the ED by his PCP for evaluation of confusion and weakness. In the ER he was noted to have elevated lactate.   Assessment & Plan:   Active Problems:   Seizures ([place])   Tobacco use disorder   Essential hypertension, benign   Hyperlipidemia LDL goal <130   PVD (peripheral vascular disease) (HCC)   CKD (chronic kidney disease), stage III   Fever   Lactic acidosis  Altered mental status likely metabolic, may be from infection -Nonfocal in nature -Cultures have been ordered. Her negative at this time -Lactate levels have trended down. -Continue vancomycin and cefepime for likely 1 more day -Gentle IV fluids -Continue neurochecks. Order MRI Brain (addendum - unable to get MRI due to hx of aneurysm clipping without any details dating back >15 yrs ago, will order CT head instead for now)  Left lower extremity nonhealing ulcer -X-ray of the foot done show some scattered irregularities otherwise no findings suggestive of osteomyelitis  History of seizures -Continue Keppra 500 mg twice daily. Depakote 250 mg every 8 hours  Coronary artery disease/peripheral vascular disease -Continue statin, Coreg  Anemia -Likely secondary to chronic disease. No active signs of bleeding at this time -Check iron studies including B12 and folate. Check Occult Blood.   Chronic kidney disease stage III -Creatinine appears to be around baseline of 1.5 -Avoid nephrotoxic drugs and monitor urine output closely  Generalized weakness especially lower extremities -He will need physical therapy -Check TSH  Hx of Alcohol abuse- quit 5 months ago Hx of Tobacco Use - Quit about a week ago.   DVT prophylaxis: Hep Sub Q Code Status:  Full  Family Communication:  POA at bedside  Disposition Plan: Transfer to Med-Surg.   Consultants:   None  Procedures:   None  Antimicrobials:   Vanc 5/14  Cefepime 5/14   Subjective: Patient is pleasantly confused this morning. He is only oriented to person.  POA states patient lives alone with one other family member at home but doesn't think he will be able to care for himself. He is not able to ambulate much without any assistance. Per family his mental status is not yet all the way back to his baseline but some improvement from yesterday.   Objective: Vitals:   08/19/16 0915 08/19/16 0930 08/19/16 1033 08/19/16 1300  BP:  121/66 (!) 129/36   Pulse: 81 86 70   Resp:   16   Temp:   98.4 F (36.9 C)   TempSrc:   Oral   SpO2: 100% 100% 98%   Weight:    55.5 kg (122 lb 5.7 oz)  Height:    5\' 6"  (1.676 m)    Intake/Output Summary (Last 24 hours) at 08/19/16 1504 Last data filed at 08/19/16 0129  Gross per 24 hour  Intake             2050 ml  Output              150 ml  Net             1900 ml   Filed Weights   08/19/16 1300  Weight: 55.5 kg (122 lb 5.7 oz)    Examination:  General exam: Appears calm and comfortable  Respiratory system: Clear to auscultation. Respiratory effort normal. Cardiovascular system: S1 & S2 heard, RRR. No JVD, murmurs, rubs, gallops or clicks. No pedal edema. Gastrointestinal system: Abdomen is nondistended, soft and nontender. No organomegaly or masses felt. Normal bowel sounds heard. Central nervous system: Alert and oriented to only person.  Extremities: 5/5 strenght in UE but 3+/5 in b/l LE Skin: No rashes, lesions or ulcers Psychiatry: Judgement and insight appear normal. Mood & affect appropriate.     Data Reviewed:   CBC:  Recent Labs Lab 08/18/16 1348 08/19/16 0454  WBC 13.1* 11.7*  NEUTROABS 10.1*  --   HGB 7.9* 7.7*  HCT 24.1* 23.1*  MCV 87.0 87.2  PLT 189 341*   Basic Metabolic Panel:  Recent Labs Lab  08/18/16 1348 08/19/16 0454  NA 138 140  K 5.2* 4.6  CL 104 110  CO2 22 22  GLUCOSE 143* 102*  BUN 41* 34*  CREATININE 1.83* 1.29*  CALCIUM 8.4* 8.2*   GFR: Estimated Creatinine Clearance: 37.6 mL/min (A) (by C-G formula based on SCr of 1.29 mg/dL (H)). Liver Function Tests:  Recent Labs Lab 08/18/16 1348  AST 29  ALT 22  ALKPHOS 55  BILITOT 0.5  PROT 6.8  ALBUMIN 2.9*   No results for input(s): LIPASE, AMYLASE in the last 168 hours. No results for input(s): AMMONIA in the last 168 hours. Coagulation Profile: No results for input(s): INR, PROTIME in the last 168 hours. Cardiac Enzymes: No results for input(s): CKTOTAL, CKMB, CKMBINDEX, TROPONINI in the last 168 hours. BNP (last 3 results) No results for input(s): PROBNP in the last 8760 hours. HbA1C: No results for input(s): HGBA1C in the last 72 hours. CBG: No results for input(s): GLUCAP in the last 168 hours. Lipid Profile: No results for input(s): CHOL, HDL, LDLCALC, TRIG, CHOLHDL, LDLDIRECT in the last 72 hours. Thyroid Function Tests: No results for input(s): TSH, T4TOTAL, FREET4, T3FREE, THYROIDAB in the last 72 hours. Anemia Panel: No results for input(s): VITAMINB12, FOLATE, FERRITIN, TIBC, IRON, RETICCTPCT in the last 72 hours. Sepsis Labs:  Recent Labs Lab 08/18/16 1708 08/18/16 2150 08/18/16 2355 08/19/16 0454  LATICACIDVEN 1.13 2.1* 1.2 1.0    Recent Results (from the past 240 hour(s))  Blood Culture (routine x 2)     Status: None (Preliminary result)   Collection Time: 08/18/16  1:48 PM  Result Value Ref Range Status   Specimen Description BLOOD LEFT FOREARM  Final   Special Requests   Final    BOTTLES DRAWN AEROBIC AND ANAEROBIC Blood Culture adequate volume   Culture NO GROWTH < 24 HOURS  Final   Report Status PENDING  Incomplete  Blood Culture (routine x 2)     Status: None (Preliminary result)   Collection Time: 08/18/16  2:00 PM  Result Value Ref Range Status   Specimen Description  BLOOD RIGHT ANTECUBITAL  Final   Special Requests   Final    BOTTLES DRAWN AEROBIC AND ANAEROBIC Blood Culture adequate volume   Culture NO GROWTH < 24 HOURS  Final   Report Status PENDING  Incomplete  Respiratory Panel by PCR     Status: None   Collection Time: 08/18/16  8:04 PM  Result Value Ref Range Status   Adenovirus NOT DETECTED NOT DETECTED Final   Coronavirus 229E NOT DETECTED NOT DETECTED Final   Coronavirus HKU1 NOT DETECTED NOT DETECTED Final   Coronavirus NL63 NOT DETECTED NOT DETECTED Final   Coronavirus OC43 NOT DETECTED NOT DETECTED Final   Metapneumovirus  NOT DETECTED NOT DETECTED Final   Rhinovirus / Enterovirus NOT DETECTED NOT DETECTED Final   Influenza A NOT DETECTED NOT DETECTED Final   Influenza B NOT DETECTED NOT DETECTED Final   Parainfluenza Virus 1 NOT DETECTED NOT DETECTED Final   Parainfluenza Virus 2 NOT DETECTED NOT DETECTED Final   Parainfluenza Virus 3 NOT DETECTED NOT DETECTED Final   Parainfluenza Virus 4 NOT DETECTED NOT DETECTED Final   Respiratory Syncytial Virus NOT DETECTED NOT DETECTED Final   Bordetella pertussis NOT DETECTED NOT DETECTED Final   Chlamydophila pneumoniae NOT DETECTED NOT DETECTED Final   Mycoplasma pneumoniae NOT DETECTED NOT DETECTED Final         Radiology Studies: Dg Chest Port 1 View  Result Date: 08/18/2016 CLINICAL DATA:  Cough and weakness EXAM: PORTABLE CHEST 1 VIEW COMPARISON:  April 14, 2016 FINDINGS: There is a minimal left pleural effusion with mild left base atelectasis. Lungs elsewhere are clear. Heart is borderline enlarged with pulmonary vascularity within normal limits. There is aortic atherosclerosis. No adenopathy. No bone lesions. IMPRESSION: Minimal left pleural effusion with left base atelectasis. Lungs elsewhere clear. Borderline cardiac enlargement with aortic atherosclerosis. Electronically Signed   By: Lowella Grip III M.D.   On: 08/18/2016 13:42   Dg Foot 2 Views Left  Result Date:  08/18/2016 CLINICAL DATA:  Nonhealing open wound of heel. Left foot pain and swelling. EXAM: LEFT FOOT - 2 VIEW COMPARISON:  Calcaneal radiographs 06/26/2016 FINDINGS: Minimal skin irregularity overlies the posterior calcaneus. No radiopaque foreign body. No periosteal reaction or bony destructive change to suggest osteomyelitis. Minimal enthesopathic changes noted at the Achilles tendon insertion, stable from prior exam. No acute fracture or subluxation. Hammertoe deformity of the second through fifth digits noted. There is mild diffuse soft tissue edema of the foot. IMPRESSION: Skin irregularity of the posterior heel consistent with soft tissue ulcer. No radiopaque foreign body or findings to suggest osteomyelitis. Electronically Signed   By: Jeb Levering M.D.   On: 08/18/2016 21:35        Scheduled Meds: . atorvastatin  10 mg Oral Daily  . carvedilol  6.25 mg Oral BID WC  . divalproex  250 mg Oral Q8H  . heparin  5,000 Units Subcutaneous Q8H  . levETIRAcetam  500 mg Oral BID   Continuous Infusions: . sodium chloride 75 mL/hr at 08/19/16 0146  . ceFEPime (MAXIPIME) IV    . [START ON 08/20/2016] vancomycin       LOS: 1 day    Time spent: 35 mins     Janet Humphreys Arsenio Loader, MD [facility] Pager [phone]   If 7PM-7AM, please contact night-coverage [email or web address] Password [facility] 08/19/2016, 3:04 PM

## 2016-08-20 ENCOUNTER — Encounter (HOSPITAL_COMMUNITY)
Admission: EM | Disposition: A | Discharge status: Skilled Nursing Facility | Payer: Self-pay | Source: Home / Self Care | Attending: Internal Medicine

## 2016-08-20 ENCOUNTER — Ambulatory Visit (HOSPITAL_COMMUNITY): Admission: RE | Admit: 2016-08-20 | Payer: Medicare Other | Source: Ambulatory Visit | Admitting: Cardiovascular Disease

## 2016-08-20 ENCOUNTER — Observation Stay (HOSPITAL_COMMUNITY): Payer: Medicare Other

## 2016-08-20 DIAGNOSIS — L8990 Pressure ulcer of unspecified site, unspecified stage: Secondary | ICD-10-CM | POA: Insufficient documentation

## 2016-08-20 DIAGNOSIS — J440 Chronic obstructive pulmonary disease with acute lower respiratory infection: Secondary | ICD-10-CM | POA: Diagnosis not present

## 2016-08-20 DIAGNOSIS — I739 Peripheral vascular disease, unspecified: Secondary | ICD-10-CM | POA: Diagnosis not present

## 2016-08-20 DIAGNOSIS — D649 Anemia, unspecified: Secondary | ICD-10-CM | POA: Diagnosis not present

## 2016-08-20 DIAGNOSIS — G9341 Metabolic encephalopathy: Secondary | ICD-10-CM | POA: Diagnosis not present

## 2016-08-20 DIAGNOSIS — J189 Pneumonia, unspecified organism: Secondary | ICD-10-CM | POA: Diagnosis not present

## 2016-08-20 DIAGNOSIS — R531 Weakness: Secondary | ICD-10-CM | POA: Diagnosis not present

## 2016-08-20 DIAGNOSIS — R21 Rash and other nonspecific skin eruption: Secondary | ICD-10-CM | POA: Diagnosis not present

## 2016-08-20 DIAGNOSIS — N179 Acute kidney failure, unspecified: Secondary | ICD-10-CM | POA: Diagnosis not present

## 2016-08-20 DIAGNOSIS — R569 Unspecified convulsions: Secondary | ICD-10-CM | POA: Diagnosis not present

## 2016-08-20 DIAGNOSIS — F17200 Nicotine dependence, unspecified, uncomplicated: Secondary | ICD-10-CM | POA: Diagnosis not present

## 2016-08-20 DIAGNOSIS — D638 Anemia in other chronic diseases classified elsewhere: Secondary | ICD-10-CM | POA: Diagnosis not present

## 2016-08-20 LAB — BASIC METABOLIC PANEL
ANION GAP: 7 (ref 5–15)
BUN: 27 mg/dL — ABNORMAL HIGH (ref 6–20)
CALCIUM: 8.1 mg/dL — AB (ref 8.9–10.3)
CHLORIDE: 112 mmol/L — AB (ref 101–111)
CO2: 21 mmol/L — ABNORMAL LOW (ref 22–32)
CREATININE: 1.13 mg/dL (ref 0.61–1.24)
GFR calc non Af Amer: >60 mL/min (ref >60)
GLUCOSE: 101 mg/dL — AB (ref 65–99)
Potassium: 4.2 mmol/L (ref 3.5–5.1)
Sodium: 140 mmol/L (ref 135–145)

## 2016-08-20 LAB — FOLATE RBC
FOLATE, HEMOLYSATE: 400.6 ng/mL
Folate, RBC: 1642 ng/mL (ref >498)
HEMATOCRIT: 24.4 % — AB (ref 37.5–51.0)

## 2016-08-20 LAB — CBC
HEMATOCRIT: 21.9 % — AB (ref 39.0–52.0)
HEMOGLOBIN: 7.2 g/dL — AB (ref 13.0–17.0)
MCH: 28.8 pg (ref 26.0–34.0)
MCHC: 32.9 g/dL (ref 30.0–36.0)
MCV: 87.6 fL (ref 78.0–100.0)
Platelets: 178 10*3/uL (ref 150–400)
RBC: 2.5 MIL/uL — AB (ref 4.22–5.81)
RDW: 16.1 % — AB (ref 11.5–15.5)
WBC: 11.9 10*3/uL — ABNORMAL HIGH (ref 4.0–10.5)

## 2016-08-20 LAB — VITAMIN D 25 HYDROXY (VIT D DEFICIENCY, FRACTURES): VIT D 25 HYDROXY: 33.6 ng/mL (ref 30.0–100.0)

## 2016-08-20 SURGERY — LOWER EXTREMITY ANGIOGRAPHY
Anesthesia: LOCAL

## 2016-08-20 MED ORDER — CEFPODOXIME PROXETIL 200 MG PO TABS
200.0000 mg | ORAL_TABLET | Freq: Two times a day (BID) | ORAL | Status: DC
  Administered 2016-08-20 – 2016-08-23 (×6): 200 mg via ORAL
  Filled 2016-08-20 (×8): qty 1

## 2016-08-20 NOTE — Progress Notes (Addendum)
PROGRESS NOTE   [name]  WNI:[id number]    DOB: 05-21-1940    DOA: 08/18/2016  PCP: [name], NP   I have briefly reviewed patients previous medical records in [facility].  Brief Narrative:  76 year old male with PMH of COPD, HTN, HLD, tobacco and alcohol use, seizure disorder, brain aneurysm status post clipping remotely, PAF, not good candidate for anticoagulation due to alcohol use, underwent angiography on week prior to admission for PAD and slow healing left heel pressure ulcer which showed moderate heavy daily calcified iliac disease bilaterally with short occlusion of left distal SFA for which angioplasty was attempted but not successful, repeat procedure planned 5/16 but then patient presented to the hospital on 5/14 with low-grade fever, cough and cold, seen at urgent care and chest x-ray negative for pneumonia, then developed rash on both upper extremities, confusion and admitted for further evaluation and management.   Assessment & Plan:   Active Problems:   Seizures ([place])   Tobacco use disorder   Essential hypertension, benign   Hyperlipidemia LDL goal <130   PVD (peripheral vascular disease) (HCC)   CKD (chronic kidney disease), stage III   Fever   Lactic acidosis   Pressure injury of skin   1. Fever and weakness/? PNA: Highest fever since admission: 100.4. Mild leukocytosis/11 K. RSV panel negative. Urine microscopy negative. Blood cultures 2: Negative to date. Chest x-ray 5/14: Minimal left pleural effusion and left basilar atelectasis but otherwise clear. Left foot x-ray: No osteomyelitis. CT abdomen and pelvis without contrast 5/16: Shows left lower lobe process which may be an infiltrate. Treated empirically with vancomycin and cefepime-DC'd today and switch to oral Vantin (unknown allergy to penicillins listed) to complete total one-week treatment. 2. Acute encephalopathy:? Related to problem #1. Unable to obtain MRI brain due to aneurysm clips. CT  head without contrast 5/15: No acute abnormality.? Baseline mental status. Seems coherent today.? Resolved. 3. PAD: Discussed with cardiology. As per their follow-up, left heel ulcer does not appear to be infected. Attempted left SFA angioplasty last week was not successful. Plan was to reattempt today but given patient's multiple medical issues, he is not a good candidate. The procedure is not urgent and plan is to follow-up with cardiologist in 1-2 weeks after discharge. 4. Left heel slow healing ulcer: No acute findings. Does not seem the source of infection. 5. Seizure disorder: Continue Keppra and Depakote. 6. Acute kidney injury complicating stage III chronic kidney disease: Baseline creatinine is not entirely known. It may be in the 1.1-1.3 range. Improved to 1.13. 7. Skin rash: On both upper extremities. Constellation of skin rash, acute kidney injury, low-grade fever of unclear etiology >? Delayed contrast reaction versus cholesterol emboli. 8. Anemia: Hemoglobin dropped from 11.9 before vascular procedure to 7.2 on 5/16 (in the 7 range for the last 3 days). No external evidence of bleeding/hematoma. CT abdomen and pelvis without contrast also does not show evidence of bleeding or hematoma. FOBT negative. No history suggestive of GI bleed. Anemia panel: Folate, B12 unremarkable. Ferritin 288. Iron 22.? Anemia of chronic disease. Follow CBC in a.m. and transfuse if hemoglobin <7 g per DL. 9. CAD: No chest pain reported. 10. Alcohol abuse: States that he quit several months ago. 11. Tobacco abuse: States that he quit about a week ago.   DVT prophylaxis: Heparin Code Status: DO NOT RESUSCITATE Family Communication: None at bedside Disposition: DC home when medically improved   Consultants:  Cardiology   Procedures:  None  Antimicrobials:  IV cefepime and vancomycin-discontinued Oral Augmentin    Subjective: Seen this morning. Oriented to person and place. Denied dyspnea or cough.  Reported chronic back pain.   ROS: No bleeding reported no black stools.  Objective:  Vitals:   08/19/16 1300 08/19/16 2147 08/20/16 0552 08/20/16 1455  BP:  (!) 151/52 (!) 132/53 (!) 170/51  Pulse:  75 80 73  Resp:  18 18 20   Temp:  98.2 F (36.8 C) 99 F (37.2 C) 98 F (36.7 C)  TempSrc:   Oral Oral  SpO2:  99% 97% 93%  Weight: 55.5 kg (122 lb 5.7 oz)     Height: 5\' 6"  (1.676 m)       Examination:  General exam: Pleasant elderly male, chronically ill looking, lying comfortably propped up in bed. Respiratory system: Diminished breath sounds in the bases with occasional basal crackles but otherwise clear to auscultation. Respiratory effort normal. Cardiovascular system: S1 & S2 heard, RRR. No JVD, murmurs, rubs, gallops or clicks. 2+ pitting bilateral leg edema. Tele: SR Gastrointestinal system: Abdomen is nondistended, soft and nontender. No organomegaly or masses felt. Normal bowel sounds heard. Central nervous system: Alert and oriented to person and place. No focal neurological deficits. Extremities: Symmetric 5 x 5 power. Patchy erythematous skin rash over the dorsum of left wrist and hand and right dorsal proximal forearm without other acute findings Skin: No rashes, lesions or ulcers Psychiatry: Judgement and insight impaired. Mood & affect appropriate.     Data Reviewed: I have personally reviewed following labs and imaging studies  CBC:  Recent Labs Lab 08/18/16 1348 08/19/16 0454 08/19/16 1632 08/20/16 0417  WBC 13.1* 11.7*  --  11.9*  NEUTROABS 10.1*  --   --   --   HGB 7.9* 7.7*  --  7.2*  HCT 24.1* 23.1* 24.4* 21.9*  MCV 87.0 87.2  --  87.6  PLT 189 149*  --  948   Basic Metabolic Panel:  Recent Labs Lab 08/18/16 1348 08/19/16 0454 08/20/16 0417  NA 138 140 140  K 5.2* 4.6 4.2  CL 104 110 112*  CO2 22 22 21*  GLUCOSE 143* 102* 101*  BUN 41* 34* 27*  CREATININE 1.83* 1.29* 1.13  CALCIUM 8.4* 8.2* 8.1*   Liver Function Tests:  Recent  Labs Lab 08/18/16 1348  AST 29  ALT 22  ALKPHOS 55  BILITOT 0.5  PROT 6.8  ALBUMIN 2.9*     Recent Results (from the past 240 hour(s))  Blood Culture (routine x 2)     Status: None (Preliminary result)   Collection Time: 08/18/16  1:48 PM  Result Value Ref Range Status   Specimen Description BLOOD LEFT FOREARM  Final   Special Requests   Final    BOTTLES DRAWN AEROBIC AND ANAEROBIC Blood Culture adequate volume   Culture NO GROWTH 2 DAYS  Final   Report Status PENDING  Incomplete  Blood Culture (routine x 2)     Status: None (Preliminary result)   Collection Time: 08/18/16  2:00 PM  Result Value Ref Range Status   Specimen Description BLOOD RIGHT ANTECUBITAL  Final   Special Requests   Final    BOTTLES DRAWN AEROBIC AND ANAEROBIC Blood Culture adequate volume   Culture NO GROWTH 2 DAYS  Final   Report Status PENDING  Incomplete  Respiratory Panel by PCR     Status: None   Collection Time: 08/18/16  8:04 PM  Result Value Ref Range Status   Adenovirus  NOT DETECTED NOT DETECTED Final   Coronavirus 229E NOT DETECTED NOT DETECTED Final   Coronavirus HKU1 NOT DETECTED NOT DETECTED Final   Coronavirus NL63 NOT DETECTED NOT DETECTED Final   Coronavirus OC43 NOT DETECTED NOT DETECTED Final   Metapneumovirus NOT DETECTED NOT DETECTED Final   Rhinovirus / Enterovirus NOT DETECTED NOT DETECTED Final   Influenza A NOT DETECTED NOT DETECTED Final   Influenza B NOT DETECTED NOT DETECTED Final   Parainfluenza Virus 1 NOT DETECTED NOT DETECTED Final   Parainfluenza Virus 2 NOT DETECTED NOT DETECTED Final   Parainfluenza Virus 3 NOT DETECTED NOT DETECTED Final   Parainfluenza Virus 4 NOT DETECTED NOT DETECTED Final   Respiratory Syncytial Virus NOT DETECTED NOT DETECTED Final   Bordetella pertussis NOT DETECTED NOT DETECTED Final   Chlamydophila pneumoniae NOT DETECTED NOT DETECTED Final   Mycoplasma pneumoniae NOT DETECTED NOT DETECTED Final         Radiology Studies: Ct  Abdomen Pelvis Wo Contrast  Result Date: 08/20/2016 CLINICAL DATA:  Drop in hemoglobin/hematocrit. Recent aortogram and peripheral vascular angioplasty. EXAM: CT ABDOMEN AND PELVIS WITHOUT CONTRAST TECHNIQUE: Multidetector CT imaging of the abdomen and pelvis was performed following the standard protocol without IV contrast. COMPARISON:  None. FINDINGS: Lower chest: There is a small left pleural effusion hand left lower lobe infiltrate and atelectasis. The right lung base is clear. No pericardial effusion. Aortic calcifications are noted. Hepatobiliary: No focal hepatic lesions or intrahepatic biliary dilatation. Pancreas: No mass, inflammation or ductal dilatation. Spleen: Normal size.  No focal lesions. Adrenals/Urinary Tract: The adrenal glands plan kidneys are grossly normal. No renal, ureteral or bladder calculi or mass. Stomach/Bowel: The stomach, duodenum, small bowel and colon are grossly normal. No obvious inflammatory changes, mass lesions or obstructive findings. Vascular/Lymphatic: Severe atherosclerotic calcifications involving the aorta and branch vessels. There is tortuosity and ectasia of the no focal aneurysm. Similar findings involving the iliac arteries and branch vessels in the pelvis. An IVC filter is noted. No mesenteric or retroperitoneal mass or adenopathy. No hematoma is identified. Reproductive: The prostate gland and seminal vesicles are unremarkable. Other: No inguinal mass or adenopathy. Small left inguinal hernia containing fat. There is also a right sided flank hernia containing retroperitoneal/perirenal fat. Subcutaneous fluid/ edema in the gluteal regions bilaterally, left greater than right but no discrete hematoma. Musculoskeletal: No significant bony findings. IMPRESSION: No abdominal/ pelvic or groin hematoma to account for the patient's drop in hemoglobin. Extensive atherosclerotic calcifications involving the aorta and branch vessels. Left lower lobe process likely a  combination of effusion, atelectasis and infiltrate. Electronically Signed   By: Marijo Sanes M.D.   On: 08/20/2016 12:27   Ct Head Wo Contrast  Result Date: 08/19/2016 CLINICAL DATA:  Acute onset confusion and weakness EXAM: CT HEAD WITHOUT CONTRAST TECHNIQUE: Contiguous axial images were obtained from the base of the skull through the vertex without intravenous contrast. COMPARISON:  Head CT 04/14/2016 FINDINGS: Brain: No mass lesion, intraparenchymal hemorrhage or extra-axial collection. No evidence of acute cortical infarct. There is unchanged encephalomalacia at the right frontal pole. There is periventricular hypoattenuation compatible with chronic microvascular disease. Atrophy with ex vacuo dilatation of the ventricles is unchanged. Vascular: There is aneurysm clip at the left carotid terminus, unchanged. No hyperdense vessel. Calcification of the vertebral arteries at the skullbase. Skull: Status post right frontal craniectomy and left frontoparietal craniotomy. Sinuses/Orbits: Moderate opacification of the left ethmoid sinus. Normal orbits. IMPRESSION: 1. No acute abnormality. 2. Unchanged ventriculomegaly, generalized atrophy, right  frontal encephalomalacia and findings of chronic microvascular ischemia 3. 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 Electronically Signed   By: Ulyses Jarred M.D.   On: 08/19/2016 22:10   Dg Foot 2 Views Left  Result Date: 08/18/2016 CLINICAL DATA:  Nonhealing open wound of heel. Left foot pain and swelling. EXAM: LEFT FOOT - 2 VIEW COMPARISON:  Calcaneal radiographs 06/26/2016 FINDINGS: Minimal skin irregularity overlies the posterior calcaneus. No radiopaque foreign body. No periosteal reaction or bony destructive change to suggest osteomyelitis. Minimal enthesopathic changes noted at the Achilles tendon insertion, stable from prior exam. No acute fracture or subluxation. Hammertoe deformity of the second through fifth digits noted. There is mild  diffuse soft tissue edema of the foot. IMPRESSION: Skin irregularity of the posterior heel consistent with soft tissue ulcer. No radiopaque foreign body or findings to suggest osteomyelitis. Electronically Signed   By: Jeb Levering M.D.   On: 08/18/2016 21:35        Scheduled Meds: . atorvastatin  10 mg Oral Daily  . carvedilol  6.25 mg Oral BID WC  . divalproex  250 mg Oral Q8H  . heparin  5,000 Units Subcutaneous Q8H  . levETIRAcetam  500 mg Oral BID   Continuous Infusions: . sodium chloride 75 mL/hr at 08/20/16 1405  . ceFEPime (MAXIPIME) IV Stopped (08/20/16 1718)  . vancomycin Stopped (08/20/16 0900)     LOS: 1 day     Kaoru Rezendes, MD, FACP, FHM. [facility] Pager [phone]  If 7PM-7AM, please contact night-coverage [email or web address] Password [email or web address] 08/20/2016, 5:08 PM

## 2016-08-20 NOTE — Evaluation (Signed)
Physical Therapy Evaluation Patient Details Name: [name] MRN: [id number] DOB: [birth year] Today's Date: 08/20/2016   History of Present Illness  pt is a 76 y/o male with pmhof ETOH abuse, COPD, CKD, HTN, admitted with several days of generalized weakness, non productive cough, fever and urinary incontinence.  Family also noted erythermatous lesions to bil arms and trunk.  Pt scheduled for L LE stenting for 100% blockage to an artery (s) on 5/16, which will have to be delayed.  Clinical Impression  Pt admitted with/for weakness, fever.  Pt needs moderate assist for basic mobility and pt would not attempt ambulation.  Pt currently limited functionally due to the problems listed. ( See problems list.)   Pt will benefit from PT to maximize function and safety in order to get ready for next venue listed below.     Follow Up Recommendations SNF    Equipment Recommendations  Rolling walker with 5" wheels    Recommendations for Other Services       Precautions / Restrictions Precautions Precautions: Fall Restrictions Weight Bearing Restrictions: No      Mobility  Bed Mobility Overal bed mobility: Needs Assistance Bed Mobility: Supine to Sit     Supine to sit: Min assist     General bed mobility comments: min assist to scoot to EOB also.  Pt guarded at best and agitated overall and didn't want to be bothered.  Transfers Overall transfer level: Needs assistance   Transfers: Squat Pivot Transfers     Squat pivot transfers: Mod assist     General transfer comment: cues for hand placement: pt stopped assisting with UE's once assist started.  Ambulation/Gait             General Gait Details: not able today  Stairs            Wheelchair Mobility    Modified Rankin (Stroke Patients Only)       Balance Overall balance assessment: Needs assistance Sitting-balance support: No upper extremity supported Sitting balance-Leahy Scale: Fair       Standing  balance-Leahy Scale: Poor                               Pertinent Vitals/Pain Pain Assessment: Faces Faces Pain Scale: Hurts even more Pain Descriptors / Indicators: Aching;Sore Pain Intervention(s): Monitored during session;Limited activity within patient's tolerance    Home Living Family/patient expects to be discharged to:: Unsure Living Arrangements: Other relatives (sister who has dementia and 24 hour caregivers) Available Help at Discharge: Friend(s);Other (Comment) (sister's PCA's help some, but are not his caregivers.) Type of Home: House Home Access: Stairs to enter Entrance Stairs-Rails: Left;Right (soon will put in ramp) Entrance Stairs-Number of Steps: 2 Home Layout: One level Home Equipment: Shower seat      Prior Function Level of Independence: Needs assistance   Gait / Transfers Assistance Needed: usually independent ambulator in the home, lately unable to ambulate without RW, which he hates.  ADL's / Homemaking Assistance Needed: sponge bathes,  nephew help with shower ~once a week.  Comments: per cousin (POA) up until recently, pt was independent  "cruising" walls and furniture in the home, minimal sponge bath and with toileting.  But in the last ?week has been less steady, needing RW and unable to make it to the bathroom in time.     Hand Dominance   Dominant Hand: Right    Extremity/Trunk Assessment   Upper Extremity  Assessment Upper Extremity Assessment: Generalized weakness    Lower Extremity Assessment Lower Extremity Assessment: RLE deficits/detail;LLE deficits/detail RLE Deficits / Details: pt holds bil LE's ridgidly.  strength >=3/5 with tonal increase RLE Coordination: decreased fine motor LLE Deficits / Details: again increased tone/rigidity which could be pt's response to pain. LLE: Unable to fully assess due to pain LLE Coordination: decreased fine motor       Communication   Communication: HOH  Cognition Arousal/Alertness:  Awake/alert Behavior During Therapy: Agitated;WFL for tasks assessed/performed (suspect some part of agitation is has fiesty personality) Overall Cognitive Status: Impaired/Different from baseline (there is some confusion that cousin can see.)                                        General Comments      Exercises     Assessment/Plan    PT Assessment Patient needs continued PT services  PT Problem List Decreased strength;Decreased range of motion;Decreased activity tolerance;Decreased balance;Decreased mobility;Decreased coordination;Decreased knowledge of use of DME;Pain       PT Treatment Interventions      PT Goals (Current goals can be found in the Care Plan section)  Acute Rehab PT Goals Patient Stated Goal: go home PT Goal Formulation: With patient Time For Goal Achievement: 09/03/16 Potential to Achieve Goals: Fair    Frequency Min 3X/week   Barriers to discharge Decreased caregiver support      Co-evaluation               AM-PAC PT "6 Clicks" Daily Activity  Outcome Measure Difficulty turning over in bed (including adjusting bedclothes, sheets and blankets)?: A Little Difficulty moving from lying on back to sitting on the side of the bed? : A Little Difficulty sitting down on and standing up from a chair with arms (e.g., wheelchair, bedside commode, etc,.)?: Total Help needed moving to and from a bed to chair (including a wheelchair)?: A Lot Help needed walking in hospital room?: A Lot Help needed climbing 3-5 steps with a railing? : A Lot 6 Click Score: 13    End of Session   Activity Tolerance: Patient tolerated treatment well;Treatment limited secondary to agitation Patient left: in chair;with call bell/phone within reach;with chair alarm set;with family/visitor present Nurse Communication: Mobility status PT Visit Diagnosis: Other abnormalities of gait and mobility (R26.89);Muscle weakness (generalized) (M62.81)    Time:  5409-8119 PT Time Calculation (min) (ACUTE ONLY): 41 min   Charges:   PT Evaluation $PT Eval Moderate Complexity: 1 Procedure PT Treatments $Therapeutic Activity: 23-37 mins   PT G Codes:   PT G-Codes **NOT FOR INPATIENT CLASS** Functional Assessment Tool Used: AM-PAC 6 Clicks Basic Mobility;Clinical judgement Functional Limitation: Mobility: Walking and moving around Mobility: Walking and Moving Around Current Status (J4782): At least 40 percent but less than 60 percent impaired, limited or restricted Mobility: Walking and Moving Around Goal Status [id number]): At least 20 percent but less than 40 percent impaired, limited or restricted    08/20/2016  Donnella Sham, PT [phone]  (pager)  Tessie Fass Rosalynn Sergent 08/20/2016, 11:10 AM

## 2016-08-20 NOTE — Progress Notes (Signed)
Progress Note  Patient Name: [name] Date of Encounter: 08/20/2016  Primary Cardiologist: Dr. 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 He was not felt to be a good candidate for anticoagulation given history of heavy alcohol use. Echocardiogram showed normal LV systolic function. He had a rash with diltiazem and was subsequently switched to carvedilol. He was seen recently by me for peripheral arterial disease with slow healing pressure ulcer on the left heel. He underwent noninvasive vascular evaluation  showed an ABI of 0.52 on the left with significant left common and external iliac artery stenosis  and an occluded left mid SFA. I proceeded with angiography last week on Wednesday which showed moderate heavily calcified iliac disease bilaterally with short occlusion of the left distal SFA. I attempted left SFA angioplasty but was not successful due to inability to cross the heavily calcified occlusion. The patient was scheduled for repeat procedure to be done today via the retrograde anterior tibial access.  However, the patient has been hospitalized since the 14th after he presented with low-grade fever, confusion and symptoms of upper respiratory tract infection. The patient was found to be anemic, and mild acute renal failure and with mild leukocytosis. There has been no obvious source of bleeding.  Inpatient Medications    Scheduled Meds: . atorvastatin  10 mg Oral Daily  . carvedilol  6.25 mg Oral BID WC  . divalproex  250 mg Oral Q8H  . heparin  5,000 Units Subcutaneous Q8H  . levETIRAcetam  500 mg Oral BID   Continuous Infusions: . sodium chloride 75 mL/hr at 08/20/16 1405  . ceFEPime (MAXIPIME) IV Stopped (08/19/16 1621)  . vancomycin Stopped  (08/20/16 0900)   PRN Meds: acetaminophen **OR** acetaminophen, ondansetron **OR** ondansetron (ZOFRAN) IV   Vital Signs    Vitals:   08/19/16 1300 08/19/16 2147 08/20/16 0552 08/20/16 1455  BP:  (!) 151/52 (!) 132/53 (!) 170/51  Pulse:  75 80 73  Resp:  18 18 20   Temp:  98.2 F (36.8 C) 99 F (37.2 C) 98 F (36.7 C)  TempSrc:   Oral Oral  SpO2:  99% 97% 93%  Weight: 122 lb 5.7 oz (55.5 kg)     Height: 5\' 6"  (1.676 m)       Intake/Output Summary (Last 24 hours) at 08/20/16 1631 Last data filed at 08/20/16 1220  Gross per 24 hour  Intake           1317.5 ml  Output              500 ml  Net            817.5 ml   Filed Weights   08/19/16 1300  Weight: 122 lb 5.7 oz (55.5 kg)    Telemetry    Normal sinus rhythm - Personally Reviewed  ECG    Not done.  Physical Exam   GEN: No acute distress.   The patient appears critically ill and he is agitated at times. Neck: No JVD Cardiac: RRR, no murmurs, rubs, or gallops.  Respiratory: Clear to auscultation bilaterally. GI: Soft, nontender, non-distended  MS: No edema; No deformity. Neuro:  Nonfocal  Psych: confused Right groin is intact with no hematoma. There is no abdominal tenderness. Left thigh is also intact with no tenderness or swelling.  Labs  Chemistry Recent Labs Lab 08/18/16 1348 08/19/16 0454 08/20/16 0417  NA 138 140 140  K 5.2* 4.6 4.2  CL 104 110 112*  CO2 22 22 21*  GLUCOSE 143* 102* 101*  BUN 41* 34* 27*  CREATININE 1.83* 1.29* 1.13  CALCIUM 8.4* 8.2* 8.1*  PROT 6.8  --   --   ALBUMIN 2.9*  --   --   AST 29  --   --   ALT 22  --   --   ALKPHOS 55  --   --   BILITOT 0.5  --   --   GFRNONAA 34* 52* >60  GFRAA 39* >60 >60  ANIONGAP 12 8 7      Hematology Recent Labs Lab 08/18/16 1348 08/19/16 0454 08/20/16 0417  WBC 13.1* 11.7* 11.9*  RBC 2.77* 2.65* 2.50*  HGB 7.9* 7.7* 7.2*  HCT 24.1* 23.1* 21.9*  MCV 87.0 87.2 87.6  MCH 28.5 29.1 28.8  MCHC 32.8 33.3 32.9  RDW 16.1*  16.2* 16.1*  PLT 189 149* 178    Cardiac EnzymesNo results for input(s): TROPONINI in the last 168 hours. No results for input(s): TROPIPOC in the last 168 hours.   BNPNo results for input(s): BNP, PROBNP in the last 168 hours.   DDimer No results for input(s): DDIMER in the last 168 hours.   Radiology    Ct Abdomen Pelvis Wo Contrast  Result Date: 08/20/2016 CLINICAL DATA:  Drop in hemoglobin/hematocrit. Recent aortogram and peripheral vascular angioplasty. EXAM: CT ABDOMEN AND PELVIS WITHOUT CONTRAST TECHNIQUE: Multidetector CT imaging of the abdomen and pelvis was performed following the standard protocol without IV contrast. COMPARISON:  None. FINDINGS: Lower chest: There is a small left pleural effusion hand left lower lobe infiltrate and atelectasis. The right lung base is clear. No pericardial effusion. Aortic calcifications are noted. Hepatobiliary: No focal hepatic lesions or intrahepatic biliary dilatation. Pancreas: No mass, inflammation or ductal dilatation. Spleen: Normal size.  No focal lesions. Adrenals/Urinary Tract: The adrenal glands plan kidneys are grossly normal. No renal, ureteral or bladder calculi or mass. Stomach/Bowel: The stomach, duodenum, small bowel and colon are grossly normal. No obvious inflammatory changes, mass lesions or obstructive findings. Vascular/Lymphatic: Severe atherosclerotic calcifications involving the aorta and branch vessels. There is tortuosity and ectasia of the no focal aneurysm. Similar findings involving the iliac arteries and branch vessels in the pelvis. An IVC filter is noted. No mesenteric or retroperitoneal mass or adenopathy. No hematoma is identified. Reproductive: The prostate gland and seminal vesicles are unremarkable. Other: No inguinal mass or adenopathy. Small left inguinal hernia containing fat. There is also a right sided flank hernia containing retroperitoneal/perirenal fat. Subcutaneous fluid/ edema in the gluteal regions  bilaterally, left greater than right but no discrete hematoma. Musculoskeletal: No significant bony findings. IMPRESSION: No abdominal/ pelvic or groin hematoma to account for the patient's drop in hemoglobin. Extensive atherosclerotic calcifications involving the aorta and branch vessels. Left lower lobe process likely a combination of effusion, atelectasis and infiltrate. Electronically Signed   By: Marijo Sanes M.D.   On: 08/20/2016 12:27   Ct Head Wo Contrast  Result Date: 08/19/2016 CLINICAL DATA:  Acute onset confusion and weakness EXAM: CT HEAD WITHOUT CONTRAST TECHNIQUE: Contiguous axial images were obtained from the base of the skull through the vertex without intravenous contrast. COMPARISON:  Head CT 04/14/2016 FINDINGS: Brain: No mass lesion, intraparenchymal hemorrhage or extra-axial collection. No evidence of acute cortical infarct. There is unchanged encephalomalacia at the right frontal pole. There is  periventricular hypoattenuation compatible with chronic microvascular disease. Atrophy with ex vacuo dilatation of the ventricles is unchanged. Vascular: There is aneurysm clip at the left carotid terminus, unchanged. No hyperdense vessel. Calcification of the vertebral arteries at the skullbase. Skull: Status post right frontal craniectomy and left frontoparietal craniotomy. Sinuses/Orbits: Moderate opacification of the left ethmoid sinus. Normal orbits. IMPRESSION: 1. No acute abnormality. 2. Unchanged ventriculomegaly, generalized atrophy, right frontal encephalomalacia and findings of chronic microvascular ischemia 3. 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 Electronically Signed   By: Ulyses Jarred M.D.   On: 08/19/2016 22:10   Dg Foot 2 Views Left  Result Date: 08/18/2016 CLINICAL DATA:  Nonhealing open wound of heel. Left foot pain and swelling. EXAM: LEFT FOOT - 2 VIEW COMPARISON:  Calcaneal radiographs 06/26/2016 FINDINGS: Minimal skin irregularity overlies the  posterior calcaneus. No radiopaque foreign body. No periosteal reaction or bony destructive change to suggest osteomyelitis. Minimal enthesopathic changes noted at the Achilles tendon insertion, stable from prior exam. No acute fracture or subluxation. Hammertoe deformity of the second through fifth digits noted. There is mild diffuse soft tissue edema of the foot. IMPRESSION: Skin irregularity of the posterior heel consistent with soft tissue ulcer. No radiopaque foreign body or findings to suggest osteomyelitis. Electronically Signed   By: Jeb Levering M.D.   On: 08/18/2016 21:35    Cardiac Studies   none  Patient Profile     76 y.o. male see above  Assessment & Plan    1. Peripheral arterial disease: The patient has slow healing pressure ulcer on the left heel which does not appear to be infected. Attempted left SFA angioplasty last week was not successful due to inability to cross the occlusion antegrade due to heavy calcifications. The plan was to do a retrograde access today via the anterior tibial artery. However, given the patient's multiple medical issues at the present time and confusion, he is not a good candidate. The procedure is not urgent and I recommend that he follows up with me in one to 2 weeks after discharge.  2. Anemia: His hemoglobin dropped from 11.9 before the procedure to 7.2. He did have previous anemia with hemoglobin as low as 8.7 3 months ago. There is no evidence of groin hematoma. A CT scan of the abdomen and pelvis showed no evidence of retroperitoneal bleed. No clear evidence of GI bleed. The patient appears to be hemodynamically stable but should transfuse if hemoglobin goes below 7.  3. Fever and rash: Exact etiology is not entirely clear. So far cultures have been negative. A delayed contrast reaction is a possibility. Cholesterol emboli is less likely as there was not much aortic manipulations.  I discussed the case with Dr. Algis Liming.   Signed, Kathlyn Sacramento, MD  08/20/2016, 4:31 PM

## 2016-08-21 DIAGNOSIS — I739 Peripheral vascular disease, unspecified: Secondary | ICD-10-CM | POA: Diagnosis not present

## 2016-08-21 DIAGNOSIS — R531 Weakness: Secondary | ICD-10-CM | POA: Diagnosis not present

## 2016-08-21 DIAGNOSIS — D638 Anemia in other chronic diseases classified elsewhere: Secondary | ICD-10-CM

## 2016-08-21 DIAGNOSIS — D649 Anemia, unspecified: Secondary | ICD-10-CM | POA: Diagnosis not present

## 2016-08-21 DIAGNOSIS — J440 Chronic obstructive pulmonary disease with acute lower respiratory infection: Secondary | ICD-10-CM | POA: Diagnosis not present

## 2016-08-21 LAB — PREPARE RBC (CROSSMATCH)

## 2016-08-21 LAB — LACTATE DEHYDROGENASE: LDH: 234 U/L — ABNORMAL HIGH (ref 98–192)

## 2016-08-21 LAB — CBC
HCT: 21.9 % — ABNORMAL LOW (ref 39.0–52.0)
HCT: 25.1 % — ABNORMAL LOW (ref 39.0–52.0)
HEMOGLOBIN: 8.4 g/dL — AB (ref 13.0–17.0)
Hemoglobin: 7 g/dL — ABNORMAL LOW (ref 13.0–17.0)
MCH: 27.7 pg (ref 26.0–34.0)
MCH: 28.3 pg (ref 26.0–34.0)
MCHC: 31.5 g/dL (ref 30.0–36.0)
MCHC: 33.5 g/dL (ref 30.0–36.0)
MCV: 88 fL (ref 78.0–100.0)
MCV: 84.5 fL (ref 78.0–100.0)
PLATELETS: 236 10*3/uL (ref 150–400)
Platelets: 245 10*3/uL (ref 150–400)
RBC: 2.49 MIL/uL — AB (ref 4.22–5.81)
RBC: 2.97 MIL/uL — AB (ref 4.22–5.81)
RDW: 16 % — ABNORMAL HIGH (ref 11.5–15.5)
RDW: 17.4 % — ABNORMAL HIGH (ref 11.5–15.5)
WBC: 13.8 10*3/uL — ABNORMAL HIGH (ref 4.0–10.5)
WBC: 11.1 10*3/uL — ABNORMAL HIGH (ref 4.0–10.5)

## 2016-08-21 LAB — HEPATIC FUNCTION PANEL
ALT: 20 U/L (ref 17–63)
AST: 25 U/L (ref 15–41)
Albumin: 2.3 g/dL — ABNORMAL LOW (ref 3.5–5.0)
Alkaline Phosphatase: 56 U/L (ref 38–126)
Total Bilirubin: 0.5 mg/dL (ref 0.3–1.2)
Total Protein: 6.6 g/dL (ref 6.5–8.1)

## 2016-08-21 MED ORDER — SODIUM CHLORIDE 0.9 % IV SOLN
Freq: Once | INTRAVENOUS | Status: AC
  Administered 2016-08-21: 14:00:00 via INTRAVENOUS

## 2016-08-21 NOTE — Progress Notes (Signed)
PT Cancellation Note  Patient Details Name: [name] MRN: [id number] DOB: December 11, 1940   Cancelled Treatment:    Reason Eval/Treat Not Completed: (P) Medical issues which prohibited therapy (Pt with HGB 7.0 currently receiving blood tranfusion.  Will plan for PT session tomorrow.  )   Cristela Blue 08/21/2016, 3:17 PM  Governor Rooks, PTA pager [phone]

## 2016-08-21 NOTE — NC FL2 (Signed)
[place] MEDICAID FL2 LEVEL OF CARE SCREENING TOOL     IDENTIFICATION  Patient Name: [name] Birthdate: 1940-06-02 Sex: male Admission Date (Current Location): 08/18/2016  [facility] and [place] Number:  Herbalist and Address:  [facility] [place] [facility], [place] [address], [place], [place] [address]      Provider Number: [id number]  Attending Physician Name and Address:  Modena Jansky, MD  Relative Name and Phone Number:  Adelfa Koh, [phone]    Current Level of Care: Hospital Recommended Level of Care: [place] Prior Approval Number:    Date Approved/Denied:   PASRR Number: [id number]  Discharge Plan: SNF    Current Diagnoses: Patient Active Problem List   Diagnosis Date Noted  . Pressure injury of skin 08/20/2016  . Fever 08/18/2016  . Lactic acidosis 08/18/2016  . Atrial fibrillation with RVR ([place]) 04/14/2016  . Chronic diastolic heart failure ([place]) 04/14/2016  . Weakness 04/14/2016  . Elevated troponin 04/14/2016  . Fall 04/14/2016  . Alcohol abuse 04/14/2016  . CKD (chronic kidney disease), stage III   . PVD (peripheral vascular disease) ([place]) 03/07/2014  . Cognitive impairment 03/07/2014  . Essential hypertension, benign 02/02/2013  . Routine general medical examination at a health care facility 02/02/2013  . Need for prophylactic vaccination and inoculation against influenza 02/02/2013  . Hyperlipidemia LDL goal <130 02/02/2013  . Hypertensive renal disease 02/02/2013  . Seizures ([place]) 07/28/2012  . Tobacco use disorder 07/28/2012    Orientation RESPIRATION BLADDER Height & Weight     Self  Normal Incontinent Weight: 55.5 kg (122 lb 5.7 oz) Height:  5\' 6"  (167.6 cm)  BEHAVIORAL SYMPTOMS/MOOD NEUROLOGICAL BOWEL NUTRITION STATUS      Incontinent Diet (Please see DC Summary)  AMBULATORY STATUS COMMUNICATION OF NEEDS Skin   Extensive Assist Verbally PU Stage and Appropriate Care                        Personal Care Assistance Level of Assistance  Bathing, Feeding, Dressing Bathing Assistance: Maximum assistance Feeding assistance: Independent Dressing Assistance: Limited assistance     Functional Limitations Info             SPECIAL CARE FACTORS FREQUENCY  PT (By licensed PT)     PT Frequency: 5x/week              Contractures Contractures Info: Not present    Additional Factors Info  Code Status, Allergies Code Status Info: DNR Allergies Info: Diltiazem, Penicillins           Current Medications (08/21/2016):  This is the current hospital active medication list Current Facility-Administered Medications  Medication Dose Route Frequency Provider Last Rate Last Dose  . acetaminophen (TYLENOL) tablet 650 mg  650 mg Oral Q6H PRN Eulogio Bear U, DO       Or  . acetaminophen (TYLENOL) suppository 650 mg  650 mg Rectal Q6H PRN Eulogio Bear U, DO      . atorvastatin (LIPITOR) tablet 10 mg  10 mg Oral Daily Vann, Jessica U, DO   10 mg at 08/21/16 1015  . carvedilol (COREG) tablet 6.25 mg  6.25 mg Oral BID WC Vann, Jessica U, DO   6.25 mg at 08/21/16 1015  . cefpodoxime (VANTIN) tablet 200 mg  200 mg Oral Q12H Hongalgi, Lenis Dickinson, MD   200 mg at 08/21/16 1015  . divalproex (DEPAKOTE) DR tablet 250 mg  250 mg Oral Q8H Eulogio Bear  U, DO   250 mg at 08/21/16 0617  . heparin injection 5,000 Units  5,000 Units Subcutaneous Q8H Eulogio Bear U, DO   5,000 Units at 08/21/16 0617  . levETIRAcetam (KEPPRA) tablet 500 mg  500 mg Oral BID Eulogio Bear U, DO   500 mg at 08/21/16 1015  . ondansetron (ZOFRAN) tablet 4 mg  4 mg Oral Q6H PRN Eulogio Bear U, DO       Or  . ondansetron (ZOFRAN) injection 4 mg  4 mg Intravenous Q6H PRN Geradine Girt, DO         Discharge Medications: Please see discharge summary for a list of discharge medications.  Relevant Imaging Results:  Relevant Lab Results:   Additional Information [id number]  Benard Halsted, LCSWA

## 2016-08-21 NOTE — Progress Notes (Signed)
PROGRESS NOTE   [name]  WIO:[id number]    DOB: 1940/10/04    DOA: 08/18/2016  PCP: [name], NP   I have briefly reviewed patients previous medical records in [facility].  Brief Narrative:  76 year old male with PMH of COPD, HTN, HLD, tobacco and alcohol use, seizure disorder, brain aneurysm status post clipping remotely, PAF, not good candidate for anticoagulation due to alcohol use, underwent angiography on week prior to admission for PAD and slow healing left heel pressure ulcer which showed moderate heavy daily calcified iliac disease bilaterally with short occlusion of left distal SFA for which angioplasty was attempted but not successful, repeat procedure planned 5/16 but then patient presented to the hospital on 5/14 with low-grade fever, cough and cold, seen at urgent care and chest x-ray negative for pneumonia, then developed rash on both upper extremities, confusion and admitted for further evaluation and management.Treating for possible pneumonia. Confusion improved but mental status not yet at baseline. Progressively worsening anemia of unclear etiology, transfusing 1 PRBC 5/17.   Assessment & Plan:   Active Problems:   Seizures ([place])   Tobacco use disorder   Essential hypertension, benign   Hyperlipidemia LDL goal <130   PVD (peripheral vascular disease) (HCC)   CKD (chronic kidney disease), stage III   Fever   Lactic acidosis   Pressure injury of skin   1. Fever and weakness/? PNA: Highest fever since admission: 100.4. Mild leukocytosis/11 K. RSV panel negative. Urine microscopy negative. Blood cultures 2: Negative to date. Chest x-ray 5/14: Minimal left pleural effusion and left basilar atelectasis but otherwise clear. Left foot x-ray: No osteomyelitis. CT abdomen and pelvis without contrast 5/16: Shows left lower lobe process which may be an infiltrate. Treated empirically with vancomycin and cefepime-DC'd 5/16 and switch to oral Vantin (unknown allergy  to penicillins listed) to complete total one-week treatment. Infiltrate did not show up on x-ray so follow-up chest x-ray may not be helpful.? CT chest without contrast in 4 weeks. 2. Acute encephalopathy:? Related to problem #1. Unable to obtain MRI brain due to aneurysm clips. CT head without contrast 5/15: No acute abnormality. As per discussion with patient's cousin, mental status has improved by about 50%. May have underlying some degree of dementia. Start thiamine. 3. PAD: Discussed with cardiology 5/16. As per their follow-up, left heel ulcer does not appear to be infected. Attempted left SFA angioplasty last week was not successful. Plan was to reattempt 5/16 but given patient's multiple medical issues, he is not a good candidate. The procedure is not urgent and plan is to follow-up with cardiologist in 1-2 weeks after discharge. 4. Left heel slow healing ulcer: No acute findings. Does not seem the source of infection. 5. Seizure disorder: Continue Keppra and Depakote. 6. Acute kidney injury complicating stage III chronic kidney disease: Baseline creatinine is not entirely known. It may be in the 1.1-1.3 range. Improved to 1.13. 7. Skin rash: On both upper extremities. Constellation of skin rash, acute kidney injury, low-grade fever of unclear etiology >? Delayed contrast reaction versus cholesterol emboli. Right upper extremity rash has resolved. Left wrist site rash has significantly improved. 8. Anemia: Hemoglobin dropped from 11.9 before vascular procedure to 7.2 on 5/16 (in the 7 range for the last 3 days). No external evidence of bleeding/hematoma. CT abdomen and pelvis without contrast also does not show evidence of bleeding or hematoma. FOBT negative. No history suggestive of GI bleed. Anemia panel: Folate, B12 unremarkable. Ferritin 288. Iron 22.? Anemia  of chronic disease. Hemoglobin down to 7 g per DL. LFTs and LDH not indicative of hemolysis. Haptoglobin and peripheral smear pending.?  Related to acute illness complicating anemia of chronic disease. Follow CBC in a.m. If improved and stable, defer further evaluation to outpatient including GI workup. 9. CAD: No chest pain reported. 10. Alcohol abuse: As per family, quit January 2018 11. Tobacco abuse: As per family, had quit in January 2018 but resumed prior to this admission.   DVT prophylaxis: Heparin Code Status: DO NOT RESUSCITATE Family Communication: Discussed in detail with patient's cousin and healthcare decision maker on 5/17. Disposition: DC SNF possibly 5/18.   Consultants:  Cardiology   Procedures:  None  Antimicrobials:  IV cefepime and vancomycin-discontinued Oral Vantin 5/16 >   Subjective: Poor historian. States that he had colonoscopy several years ago but cannot recollect. Denies blood in stools. No abdominal pain, nausea or vomiting reported. Reports normal color stools which was confirmed by nursing.  ROS: No chest pain or dyspnea.  Objective:  Vitals:   08/20/16 2155 08/21/16 0524 08/21/16 1420 08/21/16 1455  BP:  121/89 (!) 171/73 (!) 146/50  Pulse: 69 85 87 81  Resp:  18 18 18   Temp: 98.6 F (37 C) 98.4 F (36.9 C) 100.1 F (37.8 C)   TempSrc: Oral Oral Oral   SpO2: 99% 97% 100% 98%  Weight:      Height:        Examination:  General exam: Pleasant elderly male, chronically ill looking, lying comfortably propped up in bed. Respiratory system: Diminished breath sounds in the bases with occasional basal crackles but otherwise clear to auscultation. No wheezing or rhonchi appreciated. Respiratory effort normal. Cardiovascular system: S1 & S2 heard, RRR. No JVD, murmurs, rubs, gallops or clicks. 2+ pitting bilateral leg edema.  Gastrointestinal system: Abdomen is nondistended, soft and nontender. No organomegaly or masses felt. Normal bowel sounds heard. Central nervous system: Alert and oriented to person and place. No focal neurological deficits. Extremities: Symmetric 5 x 5  power. Patchy erythematous skin rash over the dorsum of left wrist and hand and right dorsal proximal forearm without other acute findings Skin: No rashes, lesions or ulcers Psychiatry: Judgement and insight impaired. Mood & affect appropriate.     Data Reviewed: I have personally reviewed following labs and imaging studies  CBC:  Recent Labs Lab 08/18/16 1348 08/19/16 0454 08/19/16 1632 08/20/16 0417 08/21/16 0417  WBC 13.1* 11.7*  --  11.9* 13.8*  NEUTROABS 10.1*  --   --   --   --   HGB 7.9* 7.7*  --  7.2* 7.0*  HCT 24.1* 23.1* 24.4* 21.9* 21.9*  MCV 87.0 87.2  --  87.6 88.0  PLT 189 149*  --  178 660   Basic Metabolic Panel:  Recent Labs Lab 08/18/16 1348 08/19/16 0454 08/20/16 0417  NA 138 140 140  K 5.2* 4.6 4.2  CL 104 110 112*  CO2 22 22 21*  GLUCOSE 143* 102* 101*  BUN 41* 34* 27*  CREATININE 1.83* 1.29* 1.13  CALCIUM 8.4* 8.2* 8.1*   Liver Function Tests:  Recent Labs Lab 08/18/16 1348 08/21/16 1144  AST 29 25  ALT 22 20  ALKPHOS 55 56  BILITOT 0.5 0.5  PROT 6.8 6.6  ALBUMIN 2.9* 2.3*     Recent Results (from the past 240 hour(s))  Blood Culture (routine x 2)     Status: None (Preliminary result)   Collection Time: 08/18/16  1:48 PM  Result  Value Ref Range Status   Specimen Description BLOOD LEFT FOREARM  Final   Special Requests   Final    BOTTLES DRAWN AEROBIC AND ANAEROBIC Blood Culture adequate volume   Culture NO GROWTH 3 DAYS  Final   Report Status PENDING  Incomplete  Blood Culture (routine x 2)     Status: None (Preliminary result)   Collection Time: 08/18/16  2:00 PM  Result Value Ref Range Status   Specimen Description BLOOD RIGHT ANTECUBITAL  Final   Special Requests   Final    BOTTLES DRAWN AEROBIC AND ANAEROBIC Blood Culture adequate volume   Culture NO GROWTH 3 DAYS  Final   Report Status PENDING  Incomplete  Respiratory Panel by PCR     Status: None   Collection Time: 08/18/16  8:04 PM  Result Value Ref Range Status    Adenovirus NOT DETECTED NOT DETECTED Final   Coronavirus 229E NOT DETECTED NOT DETECTED Final   Coronavirus HKU1 NOT DETECTED NOT DETECTED Final   Coronavirus NL63 NOT DETECTED NOT DETECTED Final   Coronavirus OC43 NOT DETECTED NOT DETECTED Final   Metapneumovirus NOT DETECTED NOT DETECTED Final   Rhinovirus / Enterovirus NOT DETECTED NOT DETECTED Final   Influenza A NOT DETECTED NOT DETECTED Final   Influenza B NOT DETECTED NOT DETECTED Final   Parainfluenza Virus 1 NOT DETECTED NOT DETECTED Final   Parainfluenza Virus 2 NOT DETECTED NOT DETECTED Final   Parainfluenza Virus 3 NOT DETECTED NOT DETECTED Final   Parainfluenza Virus 4 NOT DETECTED NOT DETECTED Final   Respiratory Syncytial Virus NOT DETECTED NOT DETECTED Final   Bordetella pertussis NOT DETECTED NOT DETECTED Final   Chlamydophila pneumoniae NOT DETECTED NOT DETECTED Final   Mycoplasma pneumoniae NOT DETECTED NOT DETECTED Final         Radiology Studies: Ct Abdomen Pelvis Wo Contrast  Result Date: 08/20/2016 CLINICAL DATA:  Drop in hemoglobin/hematocrit. Recent aortogram and peripheral vascular angioplasty. EXAM: CT ABDOMEN AND PELVIS WITHOUT CONTRAST TECHNIQUE: Multidetector CT imaging of the abdomen and pelvis was performed following the standard protocol without IV contrast. COMPARISON:  None. FINDINGS: Lower chest: There is a small left pleural effusion hand left lower lobe infiltrate and atelectasis. The right lung base is clear. No pericardial effusion. Aortic calcifications are noted. Hepatobiliary: No focal hepatic lesions or intrahepatic biliary dilatation. Pancreas: No mass, inflammation or ductal dilatation. Spleen: Normal size.  No focal lesions. Adrenals/Urinary Tract: The adrenal glands plan kidneys are grossly normal. No renal, ureteral or bladder calculi or mass. Stomach/Bowel: The stomach, duodenum, small bowel and colon are grossly normal. No obvious inflammatory changes, mass lesions or obstructive findings.  Vascular/Lymphatic: Severe atherosclerotic calcifications involving the aorta and branch vessels. There is tortuosity and ectasia of the no focal aneurysm. Similar findings involving the iliac arteries and branch vessels in the pelvis. An IVC filter is noted. No mesenteric or retroperitoneal mass or adenopathy. No hematoma is identified. Reproductive: The prostate gland and seminal vesicles are unremarkable. Other: No inguinal mass or adenopathy. Small left inguinal hernia containing fat. There is also a right sided flank hernia containing retroperitoneal/perirenal fat. Subcutaneous fluid/ edema in the gluteal regions bilaterally, left greater than right but no discrete hematoma. Musculoskeletal: No significant bony findings. IMPRESSION: No abdominal/ pelvic or groin hematoma to account for the patient's drop in hemoglobin. Extensive atherosclerotic calcifications involving the aorta and branch vessels. Left lower lobe process likely a combination of effusion, atelectasis and infiltrate. Electronically Signed   By: Ricky Stabs.D.  On: 08/20/2016 12:27   Ct Head Wo Contrast  Result Date: 08/19/2016 CLINICAL DATA:  Acute onset confusion and weakness EXAM: CT HEAD WITHOUT CONTRAST TECHNIQUE: Contiguous axial images were obtained from the base of the skull through the vertex without intravenous contrast. COMPARISON:  Head CT 04/14/2016 FINDINGS: Brain: No mass lesion, intraparenchymal hemorrhage or extra-axial collection. No evidence of acute cortical infarct. There is unchanged encephalomalacia at the right frontal pole. There is periventricular hypoattenuation compatible with chronic microvascular disease. Atrophy with ex vacuo dilatation of the ventricles is unchanged. Vascular: There is aneurysm clip at the left carotid terminus, unchanged. No hyperdense vessel. Calcification of the vertebral arteries at the skullbase. Skull: Status post right frontal craniectomy and left frontoparietal craniotomy.  Sinuses/Orbits: Moderate opacification of the left ethmoid sinus. Normal orbits. IMPRESSION: 1. No acute abnormality. 2. Unchanged ventriculomegaly, generalized atrophy, right frontal encephalomalacia and findings of chronic microvascular ischemia 3. 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 Electronically Signed   By: Ulyses Jarred M.D.   On: 08/19/2016 22:10        Scheduled Meds: . atorvastatin  10 mg Oral Daily  . carvedilol  6.25 mg Oral BID WC  . cefpodoxime  200 mg Oral Q12H  . divalproex  250 mg Oral Q8H  . heparin  5,000 Units Subcutaneous Q8H  . levETIRAcetam  500 mg Oral BID   Continuous Infusions:    LOS: 1 day     Jonmarc Bodkin, MD, FACP, FHM. [facility] Pager [phone]  If 7PM-7AM, please contact night-coverage [email or web address] Password [facility] 08/21/2016, 4:45 PM

## 2016-08-22 ENCOUNTER — Observation Stay (HOSPITAL_COMMUNITY): Payer: Medicare Other

## 2016-08-22 DIAGNOSIS — R531 Weakness: Secondary | ICD-10-CM | POA: Diagnosis not present

## 2016-08-22 DIAGNOSIS — I739 Peripheral vascular disease, unspecified: Secondary | ICD-10-CM | POA: Diagnosis not present

## 2016-08-22 DIAGNOSIS — L97429 Non-pressure chronic ulcer of left heel and midfoot with unspecified severity: Secondary | ICD-10-CM

## 2016-08-22 DIAGNOSIS — N189 Chronic kidney disease, unspecified: Secondary | ICD-10-CM

## 2016-08-22 DIAGNOSIS — J449 Chronic obstructive pulmonary disease, unspecified: Secondary | ICD-10-CM

## 2016-08-22 DIAGNOSIS — G40909 Epilepsy, unspecified, not intractable, without status epilepticus: Secondary | ICD-10-CM | POA: Diagnosis not present

## 2016-08-22 DIAGNOSIS — F17200 Nicotine dependence, unspecified, uncomplicated: Secondary | ICD-10-CM | POA: Diagnosis not present

## 2016-08-22 DIAGNOSIS — J440 Chronic obstructive pulmonary disease with acute lower respiratory infection: Secondary | ICD-10-CM | POA: Diagnosis not present

## 2016-08-22 DIAGNOSIS — D649 Anemia, unspecified: Secondary | ICD-10-CM | POA: Diagnosis not present

## 2016-08-22 LAB — OCCULT BLOOD X 1 CARD TO LAB, STOOL: Fecal Occult Bld: NEGATIVE

## 2016-08-22 LAB — TYPE AND SCREEN
ABO/RH(D): B POS
Antibody Screen: NEGATIVE
UNIT DIVISION: 0

## 2016-08-22 LAB — CBC
HCT: 24 % — ABNORMAL LOW (ref 39.0–52.0)
Hemoglobin: 7.6 g/dL — ABNORMAL LOW (ref 13.0–17.0)
MCH: 27 pg (ref 26.0–34.0)
MCHC: 31.7 g/dL (ref 30.0–36.0)
MCV: 85.1 fL (ref 78.0–100.0)
Platelets: 253 10*3/uL (ref 150–400)
RBC: 2.82 MIL/uL — AB (ref 4.22–5.81)
RDW: 17.4 % — ABNORMAL HIGH (ref 11.5–15.5)
WBC: 11 10*3/uL — ABNORMAL HIGH (ref 4.0–10.5)

## 2016-08-22 LAB — BPAM RBC
Blood Product Expiration Date: 201806142359
ISSUE DATE / TIME: 201805171427
Unit Type and Rh: 7300

## 2016-08-22 LAB — RETICULOCYTES
RBC.: 3.17 MIL/uL — ABNORMAL LOW (ref 4.22–5.81)
Retic Count, Absolute: 57.1 10*3/uL (ref 19.0–186.0)
Retic Ct Pct: 1.8 % (ref 0.4–3.1)

## 2016-08-22 LAB — PATHOLOGIST SMEAR REVIEW

## 2016-08-22 LAB — HAPTOGLOBIN: Haptoglobin: 261 mg/dL — ABNORMAL HIGH (ref 34–200)

## 2016-08-22 NOTE — Consult Note (Signed)
New Hematology/Oncology Consult   Referral ZO:XWRUE Hongalgi     Reason for Referral: Anemia   HPI: [name] was admitted with a fever, rash, and confusional 08/18/2016. He had undergone an angiogram on 08/13/2016. An unsuccessful attempt at angioplasty was performed at the left SFA. He was treated for possible left lung pneumonia during this admission. He is undergoing treatment for a nonhealing left foot ulcer at the wound center.  He was noted to have severe anemia on hospital admission. The hemoglobin had dropped to 7.9 compared to a level of 11.9 prior to the angiogram. He was transfused with one unit of packed red blood cells on 08/21/2016.  [name] denies bleeding. He is a poor historian and became agitated when I was going through his review of systems. He was transfused with packed red blood cells when he was admitted in January of this year with atrial fibrillation. He was noted to have a right thigh hematoma after a fall.      Past Medical History:  Diagnosis Date  . Alcohol abuse   . Aneurysm ([place])   . CKD (chronic kidney disease), stage III   . COPD (chronic obstructive pulmonary disease) ([place])   . Fever of unknown origin 08/2016  . Hyperchloremia   . Hypercholesteremia   . Hyperpotassemia   . Hypertension   . Hypertensive renal disease, benign   . Seizures ([place])    has not had a seizure in 15 yrs  . Tobacco use   : . Peripheral vascular disease   Past Surgical History:  Procedure Laterality Date  . ABDOMINAL AORTOGRAM W/LOWER EXTREMITY N/A 08/13/2016   Procedure: Abdominal Aortogram w/Lower Extremity;  Surgeon: Wellington Hampshire, MD;  Location: [place] [facility];  Service: Cardiovascular;  Laterality: N/A;  . 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, 1996  . EYE SURGERY     December 2017  . PERIPHERAL VASCULAR BALLOON ANGIOPLASTY  08/13/2016   Procedure: Peripheral Vascular Balloon Angioplasty;  Surgeon: Wellington Hampshire, MD;  Location: [place] [facility];  Service:  Cardiovascular;;  Aborted  :   Current Facility-Administered Medications:  .  acetaminophen (TYLENOL) tablet 650 mg, 650 mg, Oral, Q6H PRN **OR** acetaminophen (TYLENOL) suppository 650 mg, 650 mg, Rectal, Q6H PRN, Vann, Jessica U, DO .  atorvastatin (LIPITOR) tablet 10 mg, 10 mg, Oral, Daily, Vann, Jessica U, DO, 10 mg at 08/22/16 1100 .  carvedilol (COREG) tablet 6.25 mg, 6.25 mg, Oral, BID WC, Vann, Jessica U, DO, 6.25 mg at 08/22/16 1100 .  cefpodoxime (VANTIN) tablet 200 mg, 200 mg, Oral, Q12H, Hongalgi, Anand D, MD, 200 mg at 08/22/16 1100 .  divalproex (DEPAKOTE) DR tablet 250 mg, 250 mg, Oral, Q8H, Vann, Jessica U, DO, 250 mg at 08/22/16 0639 .  heparin injection 5,000 Units, 5,000 Units, Subcutaneous, Q8H, Vann, Jessica U, DO, 5,000 Units at 08/22/16 4540 .  levETIRAcetam (KEPPRA) tablet 500 mg, 500 mg, Oral, BID, Vann, Jessica U, DO, 500 mg at 08/22/16 1100 .  ondansetron (ZOFRAN) tablet 4 mg, 4 mg, Oral, Q6H PRN **OR** ondansetron (ZOFRAN) injection 4 mg, 4 mg, Intravenous, Q6H PRN, Vann, Jessica U, DO:  . atorvastatin  10 mg Oral Daily  . carvedilol  6.25 mg Oral BID WC  . cefpodoxime  200 mg Oral Q12H  . divalproex  250 mg Oral Q8H  . heparin  5,000 Units Subcutaneous Q8H  . levETIRAcetam  500 mg Oral BID  :  Allergies  Allergen Reactions  . Diltiazem Rash and  Other (See Comments)    Blisters  . Penicillins Other (See Comments)    Unknown Childhood allergy  Has patient had a PCN reaction causing immediate rash, facial/tongue/throat swelling, SOB or lightheadedness with hypotension: unknown Has patient had a PCN reaction causing severe rash involving mucus membranes or skin necrosis: unknown Has patient had a PCN reaction that required hospitalization unknown Has patient had a PCN reaction occurring within the last 10 years:no If all of the above answers are "NO", then may proceed with Cephalosporin use.   :  FH:He had 10 siblings. His sister reports her mother died of  ovarian cancer. One sister had lung cancer. He has 2 children.  SOCIAL HISTORY: He lives with a sister in [place]. He quit drinking alcohol in January. He smokes cigarettes. He worked as a Training and development officer.   Review of Systems:  Positives include: Pain in the left foot, chronic exertional dyspnea  A complete ROS was otherwise negative.   Physical Exam:  Blood pressure (!) 159/61, pulse (!) 59, temperature 99.2 F (37.3 C), temperature source Oral, resp. rate 16, height 5\' 6"  (1.676 m), weight 122 lb 5.7 oz (55.5 kg), SpO2 100 %.  HEENT: Upper denture plate, oral cavity without visible mass Lungs: Distant breath sounds, no respiratory distress Cardiac: Regular rate and rhythm, 2/6 systolic murmur Abdomen: Nontender, no hepatosplenomegaly GU: Testes without mass  Vascular: Trace edema at the lower legs bilaterally Lymph nodes: No cervical, supraclavicular, or inguinal nodes. "Shotty "bilateral axillary nodes Neurologic: Alert, not oriented, follows commands, moves all extremities-limited movement of the left leg secondary to pain in the left foot Skin: No rash, gauze dressing covering the left foot Musculoskeletal: No spine tenderness  LABS:   Recent Labs  08/21/16 1813 08/22/16 0650  WBC 11.1* 11.0*  HGB 8.4* 7.6*  HCT 25.1* 24.0*  PLT 245 253  Peripheral blood smear: The platelets are normal in number. The white cell morphology is remarkable for hypolobated and hypogranular neutrophils. No blasts or other young forms are seen. No monotonous population. There is very a and red cell size and shape. The polychromasia is mildly increased. Moderate ovalocytes. A few teardrops, burr cells, and helmets. No nucleated red cells.   Recent Labs  08/20/16 0417  NA 140  K 4.2  CL 112*  CO2 21*  GLUCOSE 101*  BUN 27*  CREATININE 1.13  CALCIUM 8.1*      RADIOLOGY:  Ct Abdomen Pelvis Wo Contrast  Result Date: 08/20/2016 CLINICAL DATA:  Drop in hemoglobin/hematocrit. Recent  aortogram and peripheral vascular angioplasty. EXAM: CT ABDOMEN AND PELVIS WITHOUT CONTRAST TECHNIQUE: Multidetector CT imaging of the abdomen and pelvis was performed following the standard protocol without IV contrast. COMPARISON:  None. FINDINGS: Lower chest: There is a small left pleural effusion hand left lower lobe infiltrate and atelectasis. The right lung base is clear. No pericardial effusion. Aortic calcifications are noted. Hepatobiliary: No focal hepatic lesions or intrahepatic biliary dilatation. Pancreas: No mass, inflammation or ductal dilatation. Spleen: Normal size.  No focal lesions. Adrenals/Urinary Tract: The adrenal glands plan kidneys are grossly normal. No renal, ureteral or bladder calculi or mass. Stomach/Bowel: The stomach, duodenum, small bowel and colon are grossly normal. No obvious inflammatory changes, mass lesions or obstructive findings. Vascular/Lymphatic: Severe atherosclerotic calcifications involving the aorta and branch vessels. There is tortuosity and ectasia of the no focal aneurysm. Similar findings involving the iliac arteries and branch vessels in the pelvis. An IVC filter is noted. No mesenteric or retroperitoneal mass or adenopathy. No  hematoma is identified. Reproductive: The prostate gland and seminal vesicles are unremarkable. Other: No inguinal mass or adenopathy. Small left inguinal hernia containing fat. There is also a right sided flank hernia containing retroperitoneal/perirenal fat. Subcutaneous fluid/ edema in the gluteal regions bilaterally, left greater than right but no discrete hematoma. Musculoskeletal: No significant bony findings. IMPRESSION: No abdominal/ pelvic or groin hematoma to account for the patient's drop in hemoglobin. Extensive atherosclerotic calcifications involving the aorta and branch vessels. Left lower lobe process likely a combination of effusion, atelectasis and infiltrate. Electronically Signed   By: Marijo Sanes M.D.   On: 08/20/2016  12:27   Ct Head Wo Contrast  Result Date: 08/19/2016 CLINICAL DATA:  Acute onset confusion and weakness EXAM: CT HEAD WITHOUT CONTRAST TECHNIQUE: Contiguous axial images were obtained from the base of the skull through the vertex without intravenous contrast. COMPARISON:  Head CT 04/14/2016 FINDINGS: Brain: No mass lesion, intraparenchymal hemorrhage or extra-axial collection. No evidence of acute cortical infarct. There is unchanged encephalomalacia at the right frontal pole. There is periventricular hypoattenuation compatible with chronic microvascular disease. Atrophy with ex vacuo dilatation of the ventricles is unchanged. Vascular: There is aneurysm clip at the left carotid terminus, unchanged. No hyperdense vessel. Calcification of the vertebral arteries at the skullbase. Skull: Status post right frontal craniectomy and left frontoparietal craniotomy. Sinuses/Orbits: Moderate opacification of the left ethmoid sinus. Normal orbits. IMPRESSION: 1. No acute abnormality. 2. Unchanged ventriculomegaly, generalized atrophy, right frontal encephalomalacia and findings of chronic microvascular ischemia 3. 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 Electronically Signed   By: Ulyses Jarred M.D.   On: 08/19/2016 22:10   Dg Chest Port 1 View  Result Date: 08/18/2016 CLINICAL DATA:  Cough and weakness EXAM: PORTABLE CHEST 1 VIEW COMPARISON:  April 14, 2016 FINDINGS: There is a minimal left pleural effusion with mild left base atelectasis. Lungs elsewhere are clear. Heart is borderline enlarged with pulmonary vascularity within normal limits. There is aortic atherosclerosis. No adenopathy. No bone lesions. IMPRESSION: Minimal left pleural effusion with left base atelectasis. Lungs elsewhere clear. Borderline cardiac enlargement with aortic atherosclerosis. Electronically Signed   By: Lowella Grip III M.D.   On: 08/18/2016 13:42   Dg Foot 2 Views Left  Result Date: 08/18/2016 CLINICAL DATA:   Nonhealing open wound of heel. Left foot pain and swelling. EXAM: LEFT FOOT - 2 VIEW COMPARISON:  Calcaneal radiographs 06/26/2016 FINDINGS: Minimal skin irregularity overlies the posterior calcaneus. No radiopaque foreign body. No periosteal reaction or bony destructive change to suggest osteomyelitis. Minimal enthesopathic changes noted at the Achilles tendon insertion, stable from prior exam. No acute fracture or subluxation. Hammertoe deformity of the second through fifth digits noted. There is mild diffuse soft tissue edema of the foot. IMPRESSION: Skin irregularity of the posterior heel consistent with soft tissue ulcer. No radiopaque foreign body or findings to suggest osteomyelitis. Electronically Signed   By: Jeb Levering M.D.   On: 08/18/2016 21:35    Assessment and Plan:   1. Anemia 2. History of alcohol and tobacco abuse 3. Peripheral vascular disease 4. Left heel ulcer 5. Seizure disorder 6. COPD 7. Chronic renal failure 8. History of a cerebral aneurysm  [name] has normocytic anemia. There is no obvious explanation for the anemia. The anemia does not appear to be related to bleeding. The differential diagnosis includes a primary bone marrow process such as myelodysplasia, a hematopoietic malignancy, and anemia related to renal failure. The anemia may also be in  part related to chronic disease and malnutrition.  I agree with the plan for a red cell transfusion and close outpatient follow-up of the anemia.  Recommendations: 1. Serum protein electrophoresis/immunofixation 2. Erythropoietin level 3. Diagnostic bone marrow aspirate and biopsy 4. Consider full chest CT to further evaluate the left lung process,? Mass 5. Please call hematology as needed over the weekend. I will check on him 08/25/2016 if he remains in the hospital. We will schedule outpatient follow-up.    Betsy Coder, MD 08/22/2016, 4:16 PM

## 2016-08-22 NOTE — Progress Notes (Signed)
Physical Therapy Treatment Patient Details Name: [name] MRN: [id number] DOB: 28-Apr-1940 Today's Date: 08/22/2016    History of Present Illness pt is a 76 y/o male with pmhof ETOH abuse, COPD, CKD, HTN, admitted with several days of generalized weakness, non productive cough, fever and urinary incontinence.  Family also noted erythermatous lesions to bil arms and trunk.  Pt scheduled for L LE stenting for 100% blockage to an artery (s) on 5/16, which will have to be delayed.    PT Comments    Pt performed increased gait during session.  He presents with increased pain to R heel.  Sister present during tx and reports he uses an offloading boot at home.  RN paged MD to see if we can supply this for patient during admission to progress mobilities.  Pt resting comfortably in chair with LLE elevated.  Plan for short term SNF placement remains appropriate to improve strength and functional mobility.      Follow Up Recommendations  SNF     Equipment Recommendations  Rolling walker with 5" wheels    Recommendations for Other Services       Precautions / Restrictions Precautions Precautions: Fall Restrictions Weight Bearing Restrictions: No    Mobility  Bed Mobility Overal bed mobility: Needs Assistance Bed Mobility: Supine to Sit     Supine to sit: Supervision     General bed mobility comments: Increased time with self assistance to LLE.    Transfers Overall transfer level: Needs assistance Equipment used: Rolling walker (2 wheeled) Transfers: Sit to/from Stand Sit to Stand: Min assist         General transfer comment: Cues for hand placement to and from seated surface.    Ambulation/Gait Ambulation/Gait assistance: Min assist Ambulation Distance (Feet): 40 Feet Assistive device: Rolling walker (2 wheeled) Gait Pattern/deviations: Step-to pattern;Decreased stride length;Shuffle;Trunk flexed;Decreased stance time - left   Gait velocity interpretation: Below normal  speed for age/gender General Gait Details: Cues for distributing weight through RW to off set pain.  Pt's sister reports he wears and offloading shoe at home.  No order in chart to restrict weight bearing.    Stairs            Wheelchair Mobility    Modified Rankin (Stroke Patients Only)       Balance     Sitting balance-Leahy Scale: Fair       Standing balance-Leahy Scale: Poor                              Cognition Arousal/Alertness: Awake/alert Behavior During Therapy: Agitated;WFL for tasks assessed/performed (he is a [place] man) Overall Cognitive Status: Impaired/Different from baseline (per family)                                        Exercises      General Comments        Pertinent Vitals/Pain Pain Assessment: Faces Faces Pain Scale: Hurts even more Pain Location: L heel, L HS Pain Descriptors / Indicators: Aching;Sore Pain Intervention(s): Monitored during session;Repositioned    Home Living                      Prior Function            PT Goals (current goals can now be found in the care plan  section) Acute Rehab PT Goals Patient Stated Goal: go home Potential to Achieve Goals: Fair Progress towards PT goals: Progressing toward goals    Frequency    Min 3X/week      PT Plan Current plan remains appropriate    Co-evaluation              AM-PAC PT "6 Clicks" Daily Activity  Outcome Measure  Difficulty turning over in bed (including adjusting bedclothes, sheets and blankets)?: A Little Difficulty moving from lying on back to sitting on the side of the bed? : A Little Difficulty sitting down on and standing up from a chair with arms (e.g., wheelchair, bedside commode, etc,.)?: A Lot Help needed moving to and from a bed to chair (including a wheelchair)?: A Little Help needed walking in hospital room?: A Little Help needed climbing 3-5 steps with a railing? : A Lot 6 Click Score: 16     End of Session Equipment Utilized During Treatment: Gait belt Activity Tolerance: Patient tolerated treatment well;Treatment limited secondary to agitation Patient left: in chair;with call bell/phone within reach;with chair alarm set;with family/visitor present Nurse Communication: Mobility status PT Visit Diagnosis: Other abnormalities of gait and mobility (R26.89);Muscle weakness (generalized) (M62.81)     Time: 0569-7948 PT Time Calculation (min) (ACUTE ONLY): 39 min  Charges:  $Gait Training: 8-22 mins $Therapeutic Activity: 23-37 mins                    G CodesGovernor Rooks, PTA pager [place] 08/22/2016, 4:21 PM

## 2016-08-22 NOTE — Progress Notes (Signed)
PROGRESS NOTE   [name]  QVZ:[id number]    DOB: [birth year]    DOA: 08/18/2016  PCP: [name], NP   I have briefly reviewed patients previous medical records in [facility].  Brief Narrative:  76 year old male with PMH of COPD, HTN, HLD, tobacco and alcohol use, seizure disorder, brain aneurysm status post clipping remotely, PAF, not good candidate for anticoagulation due to alcohol use, underwent angiography on week prior to admission for PAD and slow healing left heel pressure ulcer which showed moderate heavy daily calcified iliac disease bilaterally with short occlusion of left distal SFA for which angioplasty was attempted but not successful, repeat procedure planned 5/16 but then patient presented to the hospital on 5/14 with low-grade fever, cough and cold, seen at urgent care and chest x-ray negative for pneumonia, then developed rash on both upper extremities, confusion and admitted for further evaluation and management.Treating for possible pneumonia. Confusion improved but mental status not yet at baseline. Progressively worsening anemia of unclear etiology, S/P 1 PRBC 5/17.Hemoglobin only mildly improved. Hematology consulted on 5/18 for anemia evaluation.   Assessment & Plan:   Active Problems:   Seizures ([place])   Tobacco use disorder   Essential hypertension, benign   Hyperlipidemia LDL goal <130   PVD (peripheral vascular disease) (HCC)   CKD (chronic kidney disease), stage III   Fever   Lactic acidosis   Pressure injury of skin   1. Fever and weakness/? PNA: Highest fever since admission: 100.4. Mild leukocytosis/11 K. RSV panel negative. Urine microscopy negative. Blood cultures 2: Negative to date. Chest x-ray 5/14: Minimal left pleural effusion and left basilar atelectasis but otherwise clear. Left foot x-ray: No osteomyelitis. CT abdomen and pelvis without contrast 5/16: Shows left lower lobe process which may be an infiltrate. Treated empirically with  vancomycin and cefepime-DC'd 5/16 and switch to oral Vantin (unknown allergy to penicillins listed) to complete total one-week treatment. Infiltrate did not show up on x-ray so follow-up chest x-ray may not be helpful. As per discussion with oncology, obtain CT chest without contrast to rule out mass lesions. 2. Acute encephalopathy:? Related to problem #1. Unable to obtain MRI brain due to aneurysm clips. CT head without contrast 5/15: No acute abnormality. As per discussion with patient's cousin, mental status has improved by about 50%. May have underlying some degree of dementia. Start thiamine. No significant change in mental status over the last couple days. 3. PAD: Discussed with cardiology 5/16. As per their follow-up, left heel ulcer does not appear to be infected. Attempted left SFA angioplasty last week was not successful. Plan was to reattempt 5/16 but given patient's multiple medical issues, he is not a good candidate. The procedure is not urgent and plan is to follow-up with cardiologist in 1-2 weeks after discharge. 4. Left heel slow healing ulcer: No acute findings. Does not seem the source of infection. 5. Seizure disorder: Continue Keppra and Depakote. 6. Acute kidney injury complicating stage III chronic kidney disease: Baseline creatinine is not entirely known. It may be in the 1.1-1.3 range. Improved to 1.13. 7. Skin rash: On both upper extremities. Constellation of skin rash, acute kidney injury, low-grade fever of unclear etiology >? Delayed contrast reaction versus cholesterol emboli. The rash has resolved. 8. Anemia: Hemoglobin dropped from 11.9 before vascular procedure to 7.2 on 5/16 (in the 7 range for the last 3 days). No external evidence of bleeding/hematoma. CT abdomen and pelvis without contrast also does not show evidence of bleeding  or hematoma. FOBT negative. No history suggestive of GI bleed. Anemia panel: Folate, B12 unremarkable. Ferritin 288. Iron 22.? Anemia of chronic  disease. Hemoglobin down to 7 g per DL. LFTs, LDH, haptoglobin not indicative of hemolysis. ? Related to acute illness complicating anemia of chronic disease.  Despite 1 unit PRBC, hemoglobin only up from 7-7.6. Repeat FOBT negative. Consulted oncology and input appreciated. Discussed with Dr. Benay Spice, reports abnormal peripheral smear, anemia less likely from any bleeding, suspects underlying bone marrow issue, okay with CT chest without contrast, bone marrow biopsy possibly 5/21 but if hemoglobin stable, can follow up as outpatient. Follow erythropoietin, protein electrophoresis results. 9. CAD: No chest pain reported. 10. Alcohol abuse: As per family, quit January 2018 11. Tobacco abuse: As per family, had quit in January 2018 but resumed prior to this admission.   DVT prophylaxis: Heparin Code Status: DO NOT RESUSCITATE Family Communication: Discussed in detail with patient's cousin and healthcare decision maker on 5/17. None at bedside today. Disposition: DC SNF when hemoglobin stable.   Consultants:  Cardiology  Oncology  Procedures:  None  Antimicrobials:  IV cefepime and vancomycin-discontinued Oral Vantin 5/16 >   Subjective: Poor historian. Seen this morning while he was being cleaned up. Soft green stools noted. Denies complaints.  ROS: No chest pain or dyspnea.  Objective:  Vitals:   08/21/16 1701 08/21/16 2136 08/22/16 0636 08/22/16 1535  BP: (!) 150/46 (!) 164/58 (!) 169/59 (!) 159/61  Pulse: 77 73 70 (!) 59  Resp: '18 18 18 16  ' Temp: 99.5 F (37.5 C) 98.3 F (36.8 C) 98.5 F (36.9 C) 99.2 F (37.3 C)  TempSrc: Oral   Oral  SpO2: 100% 100% 97% 100%  Weight:      Height:        Examination:  General exam: Pleasant elderly male, chronically ill looking, lying comfortably propped up in bed. Respiratory system: clear to auscultation. No wheezing or rhonchi appreciated. Respiratory effort normal. Cardiovascular system: S1 & S2 heard, RRR. No JVD, murmurs,  rubs, gallops or clicks. 2+ pitting bilateral leg edema.  Gastrointestinal system: Abdomen is nondistended, soft and nontender. No organomegaly or masses felt. Normal bowel sounds heard. Central nervous system: Alert and oriented to person and place. No focal neurological deficits. Extremities: Symmetric 5 x 5 power. Skin rash on both upper extremities has resolved. Skin: No rashes, lesions or ulcers Psychiatry: Judgement and insight impaired. Mood & affect appropriate.     Data Reviewed: I have personally reviewed following labs and imaging studies  CBC:  Recent Labs Lab 08/18/16 1348 08/19/16 0454 08/19/16 1632 08/20/16 0417 08/21/16 0417 08/21/16 1813 08/22/16 0650  WBC 13.1* 11.7*  --  11.9* 13.8* 11.1* 11.0*  NEUTROABS 10.1*  --   --   --   --   --   --   HGB 7.9* 7.7*  --  7.2* 7.0* 8.4* 7.6*  HCT 24.1* 23.1* 24.4* 21.9* 21.9* 25.1* 24.0*  MCV 87.0 87.2  --  87.6 88.0 84.5 85.1  PLT 189 149*  --  178 236 245 119   Basic Metabolic Panel:  Recent Labs Lab 08/18/16 1348 08/19/16 0454 08/20/16 0417  NA 138 140 140  K 5.2* 4.6 4.2  CL 104 110 112*  CO2 22 22 21*  GLUCOSE 143* 102* 101*  BUN 41* 34* 27*  CREATININE 1.83* 1.29* 1.13  CALCIUM 8.4* 8.2* 8.1*   Liver Function Tests:  Recent Labs Lab 08/18/16 1348 08/21/16 1144  AST 29 25  ALT  22 20  ALKPHOS 55 32  BILITOT 0.5 0.5  PROT 6.8 6.6  ALBUMIN 2.9* 2.3*     Recent Results (from the past 240 hour(s))  Blood Culture (routine x 2)     Status: None (Preliminary result)   Collection Time: 08/18/16  1:48 PM  Result Value Ref Range Status   Specimen Description BLOOD LEFT FOREARM  Final   Special Requests   Final    BOTTLES DRAWN AEROBIC AND ANAEROBIC Blood Culture adequate volume   Culture NO GROWTH 4 DAYS  Final   Report Status PENDING  Incomplete  Blood Culture (routine x 2)     Status: None (Preliminary result)   Collection Time: 08/18/16  2:00 PM  Result Value Ref Range Status   Specimen  Description BLOOD RIGHT ANTECUBITAL  Final   Special Requests   Final    BOTTLES DRAWN AEROBIC AND ANAEROBIC Blood Culture adequate volume   Culture NO GROWTH 4 DAYS  Final   Report Status PENDING  Incomplete  Respiratory Panel by PCR     Status: None   Collection Time: 08/18/16  8:04 PM  Result Value Ref Range Status   Adenovirus NOT DETECTED NOT DETECTED Final   Coronavirus 229E NOT DETECTED NOT DETECTED Final   Coronavirus HKU1 NOT DETECTED NOT DETECTED Final   Coronavirus NL63 NOT DETECTED NOT DETECTED Final   Coronavirus OC43 NOT DETECTED NOT DETECTED Final   Metapneumovirus NOT DETECTED NOT DETECTED Final   Rhinovirus / Enterovirus NOT DETECTED NOT DETECTED Final   Influenza A NOT DETECTED NOT DETECTED Final   Influenza B NOT DETECTED NOT DETECTED Final   Parainfluenza Virus 1 NOT DETECTED NOT DETECTED Final   Parainfluenza Virus 2 NOT DETECTED NOT DETECTED Final   Parainfluenza Virus 3 NOT DETECTED NOT DETECTED Final   Parainfluenza Virus 4 NOT DETECTED NOT DETECTED Final   Respiratory Syncytial Virus NOT DETECTED NOT DETECTED Final   Bordetella pertussis NOT DETECTED NOT DETECTED Final   Chlamydophila pneumoniae NOT DETECTED NOT DETECTED Final   Mycoplasma pneumoniae NOT DETECTED NOT DETECTED Final         Radiology Studies: No results found.      Scheduled Meds: . atorvastatin  10 mg Oral Daily  . carvedilol  6.25 mg Oral BID WC  . cefpodoxime  200 mg Oral Q12H  . divalproex  250 mg Oral Q8H  . heparin  5,000 Units Subcutaneous Q8H  . levETIRAcetam  500 mg Oral BID   Continuous Infusions:    LOS: 1 day     Jerin Franzel, MD, FACP, FHM. [facility] Pager [phone]  If 7PM-7AM, please contact night-coverage [email or web address] Password [email or web address] 08/22/2016, 5:07 PM

## 2016-08-23 DIAGNOSIS — N179 Acute kidney failure, unspecified: Secondary | ICD-10-CM | POA: Diagnosis not present

## 2016-08-23 DIAGNOSIS — D649 Anemia, unspecified: Secondary | ICD-10-CM | POA: Diagnosis not present

## 2016-08-23 DIAGNOSIS — J440 Chronic obstructive pulmonary disease with acute lower respiratory infection: Secondary | ICD-10-CM | POA: Diagnosis not present

## 2016-08-23 DIAGNOSIS — I739 Peripheral vascular disease, unspecified: Secondary | ICD-10-CM | POA: Diagnosis not present

## 2016-08-23 DIAGNOSIS — R918 Other nonspecific abnormal finding of lung field: Secondary | ICD-10-CM

## 2016-08-23 DIAGNOSIS — J189 Pneumonia, unspecified organism: Secondary | ICD-10-CM

## 2016-08-23 DIAGNOSIS — R569 Unspecified convulsions: Secondary | ICD-10-CM | POA: Diagnosis not present

## 2016-08-23 DIAGNOSIS — R531 Weakness: Secondary | ICD-10-CM | POA: Diagnosis not present

## 2016-08-23 LAB — CBC
HEMATOCRIT: 24.3 % — AB (ref 39.0–52.0)
HEMOGLOBIN: 7.7 g/dL — AB (ref 13.0–17.0)
MCH: 27.1 pg (ref 26.0–34.0)
MCHC: 31.7 g/dL (ref 30.0–36.0)
MCV: 85.6 fL (ref 78.0–100.0)
Platelets: 269 10*3/uL (ref 150–400)
RBC: 2.84 MIL/uL — ABNORMAL LOW (ref 4.22–5.81)
RDW: 17.3 % — AB (ref 11.5–15.5)
WBC: 11.3 10*3/uL — ABNORMAL HIGH (ref 4.0–10.5)

## 2016-08-23 LAB — CULTURE, BLOOD (ROUTINE X 2)
CULTURE: NO GROWTH
Culture: NO GROWTH
Special Requests: ADEQUATE
Special Requests: ADEQUATE

## 2016-08-23 LAB — ERYTHROPOIETIN: Erythropoietin: 22.3 m[IU]/mL — ABNORMAL HIGH (ref 2.6–18.5)

## 2016-08-23 MED ORDER — FUROSEMIDE 20 MG PO TABS: 20.0000 mg | ORAL_TABLET | ORAL | Status: DC

## 2016-08-23 MED ORDER — CEFPODOXIME PROXETIL 200 MG PO TABS: 200.0000 mg | ORAL_TABLET | Freq: Two times a day (BID) | ORAL | Status: DC

## 2016-08-23 NOTE — Discharge Summary (Signed)
Physician Discharge Summary  [name] ERD:[id number] DOB: 1941/03/05  PCP: [name], NP  Admit date: 08/18/2016 Discharge date: 08/23/2016  Recommendations for Outpatient Follow-up:  1. M.D. at SNF in 3 days with repeat labs (CBC & BMP). Please follow multiple myeloma panel results that were sent from the hospital. 2. Dr. Dominica Severin B. Benay Spice, Hematology/Oncology: SNF to coordinate on Monday 08/25/16 regarding follow-up appointment. An email message has also been sent to Dr. Benay Spice. 3. [facility] Radiology: Their offices will call SNF to arrange outpatient follow-up regarding bone marrow biopsy. SNF to coordinate. 4. Dr. Kathlyn Sacramento, Cardiology in 2 weeks. SNF to coordinate. 5. Dr. Marshell Garfinkel, Pulmonology: MDs office will arrange repeat CT chest and follow-up in 4 weeks. 6. [name], NP/PCP: Upon discharge from SNF. 7. Recommend chest PT, incentive spirometry and flutter valve at SNF.   Home Health: None Equipment/Devices: None    Discharge Condition: Improved and stable  CODE STATUS: DO NOT RESUSCITATE  Diet recommendation: Heart healthy diet.  Discharge Diagnoses:  Active Problems:   Seizures (HCC)   Tobacco use disorder   Essential hypertension, benign   Hyperlipidemia LDL goal <130   PVD (peripheral vascular disease) (HCC)   CKD (chronic kidney disease), stage III   Fever   Lactic acidosis   Pressure injury of skin   Brief Summary: 76 year old male with PMH of COPD, HTN, HLD, tobacco and alcohol use, seizure disorder, brain aneurysm status post clipping remotely, PAF, not good candidate for anticoagulation due to alcohol use, underwent angiography a week prior to admission for PAD and slow healing left heel pressure ulcer which showed moderate heavily calcified iliac disease bilaterally with short occlusion of left distal SFA for which angioplasty was attempted but not successful, repeat procedure planned 5/16 but then patient presented to the hospital  on 5/14 with low-grade fever, cough and cold, seen at urgent care and chest x-ray negative for pneumonia, then developed rash on both upper extremities, confusion and admitted for further evaluation and management.  Assessment & Plan:   1. Fever and weakness/community-acquired pneumonia: RSV panel negative. Urine microscopy negative. Blood cultures 2: Negative to date. Chest x-ray 5/14: Minimal left pleural effusion and left basilar atelectasis but otherwise clear. Left foot x-ray: No osteomyelitis. CT abdomen and pelvis without contrast 5/16: Showed left lower lobe process which may be an infiltrate. Treated empirically with vancomycin and cefepime-DC'd 5/16 and switched to oral Vantin (unknown allergy to penicillins listed) to complete total one-week treatment on 08/24/16. Given CT abnormality, smoking history, anemia of undetermined etiology, abnormal peripheral smear, CT chest without contrast obtained on 5/18 to rule out mass lesion/malignancy. CT showed ill-defined abnormal left perihilar soft tissue density. Pulmonology was consulted and suspect that this is likely secondary to pneumonia and mucus in the airways but will need follow-up as outpatient. Recommend chest PT, incentive spirometry and flutter valve at SNF. 2. CT chest abnormality: As stated above. Pulmonology was consulted and suspect that this is likely secondary to pneumonia and mucus in the airways. Discussed with pulmonology. Complete one-week course of antibiotics and pulmonology will arrange outpatient follow-up in 1 month for repeat CT chest in follow-up. If abnormalities persist, then we will need bronchoscopy. 3. Acute encephalopathy:? Related to problem #1. Unable to obtain MRI brain due to aneurysm clips. CT head without contrast 5/15: No acute abnormality. As per discussion with patient's cousin, mental status has improved by about 50%. May have underlying some degree of dementia. Continue thiamine. Slow to improve. 4. PAD:  as  per cardiology follow-up, left heel ulcer does not appear to be infected. Attempted left SFA angioplasty last week PTA which was not successful. Planned was to reattempt 5/16 but given patient's multiple medical issues, he is not a good candidate. The procedure is not urgent and plan is to follow-up with cardiologist in 1-2 weeks after discharge. 5. Left heel slow healing ulcer: No acute findings. Does not seem the source of infection. 6. Seizure disorder: Continue Keppra and Depakote. 7. Acute kidney injury complicating stage III chronic kidney disease: Baseline creatinine is not entirely known. It may be in the 1.1-1.3 range. Improved to 1.13. 8. Skin rash: On both upper extremities. Constellation of skin rash, acute kidney injury, low-grade fever of unclear etiology >? Delayed contrast reaction versus cholesterol emboli. The rash has resolved. 9. Anemia: Hemoglobin dropped from 11.9 before vascular procedure to 7.2 on 5/16. No external evidence of bleeding/hematoma. CT abdomen and pelvis without contrast also does not show evidence of bleeding or hematoma. FOBT negative 2. No history suggestive of GI bleed. Anemia panel: Folate, B12 unremarkable. Ferritin 288. Iron 22.? Anemia of chronic disease. Hemoglobin down to 7 g per DL. LFTs, LDH, haptoglobin not indicative of hemolysis. ? Related to acute illness complicating anemia of chronic disease.  Despite 1 unit PRBC, hemoglobin only up from 7-7.6. Consulted oncology and input appreciated. Oncology reported abnormal peripheral smear, anemia less likely from any bleeding, suspects underlying bone marrow issue, obtained CT chest without contrast with discussion as above, bone marrow biopsy possibly 5/21 but if hemoglobin stable, can follow up as outpatient. Hemoglobin stable at 7.7. Follow CBCs in a couple days at [facility]. Erythropoietin 22.3. Follow multiple myeloma panel-pending. As discussed with oncology, bone marrow biopsy may be pursued as outpatient. I  discussed with interventional radiologist who will coordinate this as an outpatient with SNF. I have also sent and in box message to the oncologist regarding outpatient follow-up. 10. CAD: No chest pain reported. Continue aspirin, beta blockers and statins. 11. Alcohol abuse: As per family, quit January 2018 12. Tobacco abuse: As per family, had quit in January 2018 but resumed prior to this admission. 51. Adult failure to thrive: Consider palliative care consultation for goals of care at the SNF. 14. Lower extremity edema: Likely related to hypoalbuminemia from poor nutrition. Reduce Lasix to 20 mg every other day. Periodically follow BMP. 15. Paroxysmal A. fib: Continue carvedilol. Not candidate for anticoagulation in the context of significant anemia and history of alcohol abuse.   Consultants:  Cardiology  Oncology Pulmonology Interventional radiology  Procedures:  None  Discharge Instructions  Discharge Instructions    (HEART FAILURE PATIENTS) Call MD:  Anytime you have any of the following symptoms: 1) 3 pound weight gain in 24 hours or 5 pounds in 1 week 2) shortness of breath, with or without a dry hacking cough 3) swelling in the hands, feet or stomach 4) if you have to sleep on extra pillows at night in order to breathe.    Complete by:  As directed    Call MD for:    Complete by:  As directed    Worsening confusion.   Call MD for:  difficulty breathing, headache or visual disturbances    Complete by:  As directed    Call MD for:  extreme fatigue    Complete by:  As directed    Call MD for:  persistant dizziness or light-headedness    Complete by:  As directed    Call MD  for:  redness, tenderness, or signs of infection (pain, swelling, redness, odor or green/yellow discharge around incision site)    Complete by:  As directed    Call MD for:  temperature >100.4    Complete by:  As directed    Diet - low sodium heart healthy    Complete by:  As directed    Increase  activity slowly    Complete by:  As directed        Medication List    STOP taking these medications   LORazepam 0.5 MG tablet Commonly known as:  ATIVAN     TAKE these medications   aspirin EC 81 MG tablet Take 1 tablet (81 mg total) by mouth daily.   atorvastatin 10 MG tablet Commonly known as:  LIPITOR TAKE 1 TABLET BY MOUTH EVERY DAY   carvedilol 6.25 MG tablet Commonly known as:  COREG TAKE 1 TABLET (6.25 MG TOTAL) BY MOUTH 2 (TWO) TIMES DAILY WITH A MEAL.   cefpodoxime 200 MG tablet Commonly known as:  VANTIN Take 1 tablet (200 mg total) by mouth every 12 (twelve) hours. Discontinue after 08/24/16 doses.   [employer] ARTIFICIAL TEARS 1-0.3 % Soln Generic drug:  Propylene Glycol-Glycerin PLACE 1 DROP IN EACHEYE EVERY 6 HOURS AS NEEDED FOR DRY EYES   [employer] B-1 100 MG tablet Generic drug:  thiamine TAKE 1 TABLET BY MOUTH EVERY DAY   divalproex 250 MG DR tablet Commonly known as:  DEPAKOTE TAKE 1 TABLET BY MOUTH 3 TIMES A DAY FOR SEIZURES   furosemide 20 MG tablet Commonly known as:  LASIX Take 1 tablet (20 mg total) by mouth every other day. What changed:  medication strength  See the new instructions.   levETIRAcetam 500 MG tablet Commonly known as:  KEPPRA Take one tablet by mouth twice daily   THEREMS-M Tabs Take 1 tablet by mouth daily.      Follow-up Information    M.D. at SNF. Schedule an appointment as soon as possible for a visit in 3 day(s).   Why:  To be seen with repeat labs (CBC & BMP).       [name], NP. Schedule an appointment as soon as possible for a visit.   Specialty:  Geriatric Medicine Why:  Upon discharge from SNF. Contact information: [place]. [place] [address] [phone]        Ladell Pier, MD Follow up.   Specialty:  Oncology Why:  SNF to coordinate on Monday 08/25/16 regarding follow-up appointment. An email message has been sent also to Dr. Benay Spice. Contact information: [place] [address] [phone]        Marshell Garfinkel, MD Follow up in 4 week(s).   Specialty:  Pulmonary Disease Why:  MD's office will arrange repeat CT chest and follow-up. Contact information: [address] [place] [address] [phone]        Wellington Hampshire, MD Follow up in 2 week(s).   Specialty:  Cardiology Why:  SNF to arrange follow-up. Contact information: [place] [address] [phone]        [facility] Radiology Follow up.   Why:  Their offices will call to arrange outpatient follow-up regarding bone marrow biopsy. SNF to coordinate.         Allergies  Allergen Reactions  . Diltiazem Rash and Other (See Comments)    Blisters  . Penicillins Other (See Comments)  Unknown Childhood allergy  Has patient had a PCN reaction causing immediate rash, facial/tongue/throat swelling, SOB or lightheadedness with hypotension: unknown Has patient had a PCN reaction causing severe rash involving mucus membranes or skin necrosis: unknown Has patient had a PCN reaction that required hospitalization unknown Has patient had a PCN reaction occurring within the last 10 years:no If all of the above answers are "NO", then may proceed with Cephalosporin use.       Procedures/Studies: Ct Abdomen Pelvis Wo Contrast  Result Date: 08/20/2016 CLINICAL DATA:  Drop in hemoglobin/hematocrit. Recent aortogram and peripheral vascular angioplasty. EXAM: CT ABDOMEN AND PELVIS WITHOUT CONTRAST TECHNIQUE: Multidetector CT imaging of the abdomen and pelvis was performed following the standard protocol without IV contrast. COMPARISON:  None. FINDINGS: Lower chest: There is a small left pleural effusion hand left lower lobe infiltrate and atelectasis. The right lung base is clear. No pericardial effusion. Aortic calcifications are noted. Hepatobiliary: No focal hepatic lesions or intrahepatic biliary dilatation. Pancreas: No  mass, inflammation or ductal dilatation. Spleen: Normal size.  No focal lesions. Adrenals/Urinary Tract: The adrenal glands plan kidneys are grossly normal. No renal, ureteral or bladder calculi or mass. Stomach/Bowel: The stomach, duodenum, small bowel and colon are grossly normal. No obvious inflammatory changes, mass lesions or obstructive findings. Vascular/Lymphatic: Severe atherosclerotic calcifications involving the aorta and branch vessels. There is tortuosity and ectasia of the no focal aneurysm. Similar findings involving the iliac arteries and branch vessels in the pelvis. An IVC filter is noted. No mesenteric or retroperitoneal mass or adenopathy. No hematoma is identified. Reproductive: The prostate gland and seminal vesicles are unremarkable. Other: No inguinal mass or adenopathy. Small left inguinal hernia containing fat. There is also a right sided flank hernia containing retroperitoneal/perirenal fat. Subcutaneous fluid/ edema in the gluteal regions bilaterally, left greater than right but no discrete hematoma. Musculoskeletal: No significant bony findings. IMPRESSION: No abdominal/ pelvic or groin hematoma to account for the patient's drop in hemoglobin. Extensive atherosclerotic calcifications involving the aorta and branch vessels. Left lower lobe process likely a combination of effusion, atelectasis and infiltrate. Electronically Signed   By: Marijo Sanes M.D.   On: 08/20/2016 12:27   Ct Head Wo Contrast  Result Date: 08/19/2016 CLINICAL DATA:  Acute onset confusion and weakness EXAM: CT HEAD WITHOUT CONTRAST TECHNIQUE: Contiguous axial images were obtained from the base of the skull through the vertex without intravenous contrast. COMPARISON:  Head CT 04/14/2016 FINDINGS: Brain: No mass lesion, intraparenchymal hemorrhage or extra-axial collection. No evidence of acute cortical infarct. There is unchanged encephalomalacia at the right frontal pole. There is periventricular hypoattenuation  compatible with chronic microvascular disease. Atrophy with ex vacuo dilatation of the ventricles is unchanged. Vascular: There is aneurysm clip at the left carotid terminus, unchanged. No hyperdense vessel. Calcification of the vertebral arteries at the skullbase. Skull: Status post right frontal craniectomy and left frontoparietal craniotomy. Sinuses/Orbits: Moderate opacification of the left ethmoid sinus. Normal orbits. IMPRESSION: 1. No acute abnormality. 2. Unchanged ventriculomegaly, generalized atrophy, right frontal encephalomalacia and findings of chronic microvascular ischemia 3. 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 Electronically Signed   By: Ulyses Jarred M.D.   On: 08/19/2016 22:10   Ct Chest Wo Contrast  Result Date: 08/23/2016 CLINICAL DATA:  Anemia. Former smoker. Left lower lobe process seen on recent abdominal CT. EXAM: CT CHEST WITHOUT CONTRAST TECHNIQUE: Multidetector CT imaging of the chest was performed following the standard protocol without IV contrast. COMPARISON:  Chest radiographs 08/19/2015.  Abdominal CT 08/20/2016 FINDINGS: Cardiovascular: Advanced aortic atherosclerosis with tortuosity. Multi chamber cardiomegaly. Mitral annulus and coronary artery calcifications. Small pericardial effusion. Mediastinum/Nodes: Prominent lower paratracheal node measures 10 mm short axis. There is an 8 mm high mediastinal node. Limited assessment for hilar adenopathy given lack of IV contrast. High-density in the distal esophagus is likely ingested material. Small bilateral axillary lymph nodes. Lungs/Pleura: Volume loss in the left lower lobe. There is abnormal left perihilar soft tissue density, ill-defined causing narrowing of the segmental left lower lobe bronchi. Complete occlusion of the posterior basal segmental bronchus. There is filling of the lateral basal lower lobe segmental and subsegmental branches. Moderate consolidation in the posteromedial left lower lobe. Small  left pleural effusion. Minimal patchy opacities in the lingula. Minimal emphysema. There is an irregular ground-glass opacity in the basal most right lower lobe measuring 6 mm. Likely adjacent atelectasis. No right pleural effusion. No evidence of pulmonary edema. Upper Abdomen: No adrenal nodule.  No discrete abnormality. Musculoskeletal: Mild body wall edema. Prominent Schmorl's node within superior endplate of E93 vertebral body with minimal loss of height. No blastic or destructive lytic lesions. IMPRESSION: 1. Ill-defined abnormal left perihilar soft tissue density causing bronchial narrowing and exclusion of the posterior basal left lower lobe segmental bronchus. Associated consolidation in the left lower lobe may be postobstructive atelectasis. There is bronchial filling of the lateral basal segment in the left lower lobe. Findings are suspicious for occult perihilar malignancy. Aspiration could produce similar bronchial filling. Bronchoscopy may be helpful. Alternatively, PET-CT could be considered. Small left pleural effusion. 2. Nonspecific irregular 6 mm ground-glass nodular opacity in the basal most right lower lobe, possibly simply atelectasis. 3. Prominent lower paratracheal node measuring 10 mm, this is nonspecific and may be reactive, neoplastic adenopathy is considered pending left lower lobe process. 4. Advanced aortic atherosclerosis.  Coronary artery calcifications. 5. Focal hyperdensity in the distal esophagus is likely ingested material, such as a pill. Electronically Signed   By: Jeb Levering M.D.   On: 08/23/2016 00:09   Dg Chest Port 1 View  Result Date: 08/18/2016 CLINICAL DATA:  Cough and weakness EXAM: PORTABLE CHEST 1 VIEW COMPARISON:  April 14, 2016 FINDINGS: There is a minimal left pleural effusion with mild left base atelectasis. Lungs elsewhere are clear. Heart is borderline enlarged with pulmonary vascularity within normal limits. There is aortic atherosclerosis. No  adenopathy. No bone lesions. IMPRESSION: Minimal left pleural effusion with left base atelectasis. Lungs elsewhere clear. Borderline cardiac enlargement with aortic atherosclerosis. Electronically Signed   By: Lowella Grip III M.D.   On: 08/18/2016 13:42   Dg Foot 2 Views Left  Result Date: 08/18/2016 CLINICAL DATA:  Nonhealing open wound of heel. Left foot pain and swelling. EXAM: LEFT FOOT - 2 VIEW COMPARISON:  Calcaneal radiographs 06/26/2016 FINDINGS: Minimal skin irregularity overlies the posterior calcaneus. No radiopaque foreign body. No periosteal reaction or bony destructive change to suggest osteomyelitis. Minimal enthesopathic changes noted at the Achilles tendon insertion, stable from prior exam. No acute fracture or subluxation. Hammertoe deformity of the second through fifth digits noted. There is mild diffuse soft tissue edema of the foot. IMPRESSION: Skin irregularity of the posterior heel consistent with soft tissue ulcer. No radiopaque foreign body or findings to suggest osteomyelitis. Electronically Signed   By: Jeb Levering M.D.   On: 08/18/2016 21:35      Subjective: Seen this morning. Denies complaints. No pain, dyspnea, chest pain, dizziness or lightheadedness reported. As per RN, no acute issues  reported.  Discharge Exam:  Vitals:   08/22/16 1535 08/22/16 2145 08/23/16 0627 08/23/16 1336  BP: (!) 159/61 (!) 155/53 (!) 162/45 (!) 155/62  Pulse: (!) 59 67 63 (!) 57  Resp: _0 Temp: 99.2 F (37.3 C) 98.4 F (36.9 C) 99.4 F (37.4 C) 98.8 F (37.1 C)  TempSrc: Oral  Oral Axillary  SpO2: 100% 100% 97% 97%  Weight:      Height:        General exam: Pleasant elderly male, chronically ill looking, lying comfortably supine in bed. Respiratory system: clear to auscultation. No wheezing or rhonchi appreciated. Respiratory effort normal. Cardiovascular system: S1 & S2 heard, RRR. No JVD, murmurs, rubs, gallops or clicks. 2+ pitting bilateral leg edema.   Gastrointestinal system: Abdomen is nondistended, soft and nontender. No organomegaly or masses felt. Normal bowel sounds heard. Central nervous system: Alert and oriented to person and place. No focal neurological deficits. Extremities: Symmetric 5 x 5 power. Skin rash on both upper extremities has resolved. Skin: No rashes, lesions or ulcers Psychiatry: Judgement and insight impaired. Mood & affect appropriate.     The results of significant diagnostics from this hospitalization (including imaging, microbiology, ancillary and laboratory) are listed below for reference.     Microbiology: Recent Results (from the past 240 hour(s))  Blood Culture (routine x 2)     Status: None (Preliminary result)   Collection Time: 08/18/16  1:48 PM  Result Value Ref Range Status   Specimen Description BLOOD LEFT FOREARM  Final   Special Requests   Final    BOTTLES DRAWN AEROBIC AND ANAEROBIC Blood Culture adequate volume   Culture NO GROWTH 4 DAYS  Final   Report Status PENDING  Incomplete  Blood Culture (routine x 2)     Status: None (Preliminary result)   Collection Time: 08/18/16  2:00 PM  Result Value Ref Range Status   Specimen Description BLOOD RIGHT ANTECUBITAL  Final   Special Requests   Final    BOTTLES DRAWN AEROBIC AND ANAEROBIC Blood Culture adequate volume   Culture NO GROWTH 4 DAYS  Final   Report Status PENDING  Incomplete  Respiratory Panel by PCR     Status: None   Collection Time: 08/18/16  8:04 PM  Result Value Ref Range Status   Adenovirus NOT DETECTED NOT DETECTED Final   Coronavirus 229E NOT DETECTED NOT DETECTED Final   Coronavirus HKU1 NOT DETECTED NOT DETECTED Final   Coronavirus NL63 NOT DETECTED NOT DETECTED Final   Coronavirus OC43 NOT DETECTED NOT DETECTED Final   Metapneumovirus NOT DETECTED NOT DETECTED Final   Rhinovirus / Enterovirus NOT DETECTED NOT DETECTED Final   Influenza A NOT DETECTED NOT DETECTED Final   Influenza B NOT DETECTED NOT DETECTED Final    Parainfluenza Virus 1 NOT DETECTED NOT DETECTED Final   Parainfluenza Virus 2 NOT DETECTED NOT DETECTED Final   Parainfluenza Virus 3 NOT DETECTED NOT DETECTED Final   Parainfluenza Virus 4 NOT DETECTED NOT DETECTED Final   Respiratory Syncytial Virus NOT DETECTED NOT DETECTED Final   Bordetella pertussis NOT DETECTED NOT DETECTED Final   Chlamydophila pneumoniae NOT DETECTED NOT DETECTED Final   Mycoplasma pneumoniae NOT DETECTED NOT DETECTED Final     Labs: CBC:  Recent Labs Lab 08/18/16 1348  08/20/16 0417 08/21/16 0417 08/21/16 1813 08/22/16 0650 08/23/16 0409  WBC 13.1*  < > 11.9* 13.8* 11.1* 11.0* 11.3*  NEUTROABS 10.1*  --   --   --   --   --   --  HGB 7.9*  < > 7.2* 7.0* 8.4* 7.6* 7.7*  HCT 24.1*  < > 21.9* 21.9* 25.1* 24.0* 24.3*  MCV 87.0  < > 87.6 88.0 84.5 85.1 85.6  PLT 189  < > 178 236 245 253 269  < > = values in this interval not displayed. Basic Metabolic Panel:  Recent Labs Lab 08/18/16 1348 08/19/16 0454 08/20/16 0417  NA 138 140 140  K 5.2* 4.6 4.2  CL 104 110 112*  CO2 22 22 21*  GLUCOSE 143* 102* 101*  BUN 41* 34* 27*  CREATININE 1.83* 1.29* 1.13  CALCIUM 8.4* 8.2* 8.1*   Liver Function Tests:  Recent Labs Lab 08/18/16 1348 08/21/16 1144  AST 29 25  ALT 22 20  ALKPHOS 55 56  BILITOT 0.5 0.5  PROT 6.8 6.6  ALBUMIN 2.9* 2.3*   BNP (last 3 results)  Recent Labs  04/14/16 1612  BNP 1,172.2*    Anemia work up  Recent Labs  08/22/16 1933  RETICCTPCT 1.8   Urinalysis    Component Value Date/Time   COLORURINE YELLOW 08/18/2016 [place] 08/18/2016 1420   LABSPEC 1.013 08/18/2016 1420   PHURINE 5.0 08/18/2016 [place] 08/18/2016 1420   HGBUR NEGATIVE 08/18/2016 [place] 08/18/2016 [place] 08/18/2016 1420   PROTEINUR NEGATIVE 08/18/2016 1420   NITRITE NEGATIVE 08/18/2016 [place] 08/18/2016 1420   Discussed in detail with  patient's cousin/healthcare power of attorney today. Updated care and answered all questions. Discussed with pulmonologist and interventional radiologist today. Also sent and in box message to hematologist/oncologist.   Time coordinating discharge: Over 30 minutes  SIGNED:  Vernell Leep, MD, FACP, [place]. [facility] Pager [phone]  If 7PM-7AM, please contact night-coverage [email or web address] Password [email or web address] 08/23/2016, 1:39 PM

## 2016-08-23 NOTE — Discharge Instructions (Signed)

## 2016-08-23 NOTE — Progress Notes (Signed)
Notified on call social worker patient is medically ready for discharge to SNF and will need a bone marrow biopsy on Monday. She will call me back once she find out if he can be accepted today. 

## 2016-08-23 NOTE — Progress Notes (Signed)
Attempted to call report at [phone] x 2 and not answer. Phone just keep ringing and then stops. Will attempt again shortly. PTAR has been arrange for 2:30 pm

## 2016-08-23 NOTE — Clinical Social Work Note (Signed)
Clinical Social Worker facilitated patient discharge including contacting patient family and facility [facility]) to confirm patient discharge plans. Clinical information faxed to facility and family agreeable with plan. CSW arranged ambulance transport via [facility] to [facility]. RN to call report [phone] prior to discharge.  Clinical Social Worker will sign off for now as social work intervention is no longer needed. Please consult [place] again if new need arises.  Gohan Collister B. Joline Maxcy Clinical Social Work Dept Weekend Social Worker [phone] 12:26 PM

## 2016-08-23 NOTE — H&P (Signed)
Chief Complaint: Anemia  Referring Physician(s): Betsy Coder  Supervising Physician: Arne Cleveland  Patient Status: [facility] - In-pt  History of Present Illness: [name] is a 76 y.o. male who was admitted with a fever, rash, and confusion on 08/18/2016.   He had undergone an angiogram on 08/13/2016. An unsuccessful attempt at angioplasty was performed at the left SFA.  He was treated for possible left lung pneumonia during this admission. He is undergoing treatment for a nonhealing left foot ulcer at the wound center.  He was noted to have severe anemia on hospital admission.   His hemoglobin had dropped to 7.9 compared to a level of 11.9 prior to the angiogram.   He was transfused with one unit of packed red blood cells on 08/21/2016.  He was transfused with packed red blood cells when he was admitted in January of this year with atrial fibrillation.  He is not anticoagulated for his Afib due to anemia.  Other medical issues include COPD, HTN, tobacco and alcohol use, seizures, and history of brain aneurysm with clipping.  We are asked to perform a bone marrow biopsy.  During my visit he was very uncooperative.  All history taken from chart review.   Past Medical History:  Diagnosis Date  . Alcohol abuse   . Aneurysm ([place])   . CKD (chronic kidney disease), stage III   . COPD (chronic obstructive pulmonary disease) ([place])   . Fever of unknown origin 08/2016  . Hyperchloremia   . Hypercholesteremia   . Hyperpotassemia   . Hypertension   . Hypertensive renal disease, benign   . Seizures ([place])    has not had a seizure in 15 yrs  . Tobacco use     Past Surgical History:  Procedure Laterality Date  . ABDOMINAL AORTOGRAM W/LOWER EXTREMITY N/A 08/13/2016   Procedure: Abdominal Aortogram w/Lower Extremity;  Surgeon: Wellington Hampshire, MD;  Location: [facility];  Service: Cardiovascular;  Laterality: N/A;  . 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, 1996  .  EYE SURGERY     December 2017  . PERIPHERAL VASCULAR BALLOON ANGIOPLASTY  08/13/2016   Procedure: Peripheral Vascular Balloon Angioplasty;  Surgeon: Wellington Hampshire, MD;  Location: [facility] [place];  Service: Cardiovascular;;  Aborted    Allergies: Diltiazem and Penicillins  Medications: Prior to Admission medications   Medication Sig Start Date End Date Taking? Authorizing Provider  aspirin EC 81 MG tablet Take 1 tablet (81 mg total) by mouth daily. 08/05/16  Yes Wellington Hampshire, MD  atorvastatin (LIPITOR) 10 MG tablet TAKE 1 TABLET BY MOUTH EVERY DAY 08/11/16  Yes [name], NP  carvedilol (COREG) 6.25 MG tablet TAKE 1 TABLET (6.25 MG TOTAL) BY MOUTH 2 (TWO) TIMES DAILY WITH A MEAL. 08/04/16  Yes Reed, Tiffany L, DO  [employer] ARTIFICIAL TEARS 1-0.3 % SOLN PLACE 1 DROP IN EACHEYE EVERY 6 HOURS AS NEEDED FOR DRY EYES 06/06/16  Yes [provider]  [facility] B-1 100 MG tablet TAKE 1 TABLET BY MOUTH EVERY DAY 08/06/16  Yes [name], NP  divalproex (DEPAKOTE) 250 MG DR tablet TAKE 1 TABLET BY MOUTH 3 TIMES A DAY FOR SEIZURES 07/03/16  Yes [name], NP  furosemide (LASIX) 40 MG tablet TAKE 1 TABLET BY MOUTH ONCE DAILY FOR EDEMA 08/06/16  Yes [name], NP  levETIRAcetam (KEPPRA) 500 MG tablet Take one tablet by mouth twice daily 07/03/16  Yes [name], NP  LORazepam (ATIVAN) 0.5 MG tablet TAKE 1 TABLET BY MOUTH EVERY 12 HOURS 08/04/16  Yes [name], NP  Multiple Vitamins-Minerals (THEREMS-M) TABS Take 1 tablet by mouth daily. 07/03/16  Yes [name], NP     Family History  Problem Relation Age of Onset  . Cancer Mother   . Congestive Heart Failure Father   . Alzheimer's disease Sister   . Congestive Heart Failure Sister   . Clotting disorder Sister   . Congestive Heart Failure Brother   . Clotting disorder Sister     Social History   Social History  . Marital status: Single    Spouse name: N/A  . Number of children: N/A  .  Years of education: N/A   Social History Main Topics  . Smoking status: Former Smoker    Years: 56.00  . Smokeless tobacco: Never Used     Comment: On average about 6 cig daily   . Alcohol use 0.0 oz/week     Comment: QUIT DRINKING  04/2016  . Drug use: No  . Sexual activity: Not Currently   Other Topics Concern  . None   Social History Narrative  . None    Review of Systems  Unable to perform ROS: Mental status change  And very uncooperative  Vital Signs: BP (!) 162/45 (BP Location: Left Arm)   Pulse 63   Temp 99.4 F (37.4 C) (Oral)   Resp 18   Ht '5\' 6"'  (1.676 m)   Wt 122 lb 5.7 oz (55.5 kg)   SpO2 97%   BMI 19.75 kg/m   Physical Exam  Constitutional:  Elderly, thin  HENT:  Cavity on top of head anteriorly from history of brain aneurysm clipping.  Eyes: EOM are normal.  Neck: Normal range of motion.  Cardiovascular: Normal rate and regular rhythm.   Murmur heard. Pulmonary/Chest: Effort normal and breath sounds normal. No respiratory distress. He has no wheezes.  Abdominal: Soft. He exhibits no distension. There is no tenderness.  Neurological:  Awake and alert Not orientend  Skin:  Rashes have resolved  Psychiatric:  Agitated and uncooperative  Vitals reviewed.   Mallampati Score:     Imaging: Ct Abdomen Pelvis Wo Contrast  Result Date: 08/20/2016 CLINICAL DATA:  Drop in hemoglobin/hematocrit. Recent aortogram and peripheral vascular angioplasty. EXAM: CT ABDOMEN AND PELVIS WITHOUT CONTRAST TECHNIQUE: Multidetector CT imaging of the abdomen and pelvis was performed following the standard protocol without IV contrast. COMPARISON:  None. FINDINGS: Lower chest: There is a small left pleural effusion hand left lower lobe infiltrate and atelectasis. The right lung base is clear. No pericardial effusion. Aortic calcifications are noted. Hepatobiliary: No focal hepatic lesions or intrahepatic biliary dilatation. Pancreas: No mass, inflammation or ductal  dilatation. Spleen: Normal size.  No focal lesions. Adrenals/Urinary Tract: The adrenal glands plan kidneys are grossly normal. No renal, ureteral or bladder calculi or mass. Stomach/Bowel: The stomach, duodenum, small bowel and colon are grossly normal. No obvious inflammatory changes, mass lesions or obstructive findings. Vascular/Lymphatic: Severe atherosclerotic calcifications involving the aorta and branch vessels. There is tortuosity and ectasia of the no focal aneurysm. Similar findings involving the iliac arteries and branch vessels in the pelvis. An IVC filter is noted. No mesenteric or retroperitoneal mass or adenopathy. No hematoma is identified. Reproductive: The prostate gland and seminal vesicles are unremarkable. Other: No inguinal mass or adenopathy. Small left inguinal hernia containing fat. There is also a right sided flank hernia containing retroperitoneal/perirenal fat. Subcutaneous fluid/ edema  in the gluteal regions bilaterally, left greater than right but no discrete hematoma. Musculoskeletal: No significant bony findings. IMPRESSION: No abdominal/ pelvic or groin hematoma to account for the patient's drop in hemoglobin. Extensive atherosclerotic calcifications involving the aorta and branch vessels. Left lower lobe process likely a combination of effusion, atelectasis and infiltrate. Electronically Signed   By: Marijo Sanes M.D.   On: 08/20/2016 12:27   Ct Head Wo Contrast  Result Date: 08/19/2016 CLINICAL DATA:  Acute onset confusion and weakness EXAM: CT HEAD WITHOUT CONTRAST TECHNIQUE: Contiguous axial images were obtained from the base of the skull through the vertex without intravenous contrast. COMPARISON:  Head CT 04/14/2016 FINDINGS: Brain: No mass lesion, intraparenchymal hemorrhage or extra-axial collection. No evidence of acute cortical infarct. There is unchanged encephalomalacia at the right frontal pole. There is periventricular hypoattenuation compatible with chronic  microvascular disease. Atrophy with ex vacuo dilatation of the ventricles is unchanged. Vascular: There is aneurysm clip at the left carotid terminus, unchanged. No hyperdense vessel. Calcification of the vertebral arteries at the skullbase. Skull: Status post right frontal craniectomy and left frontoparietal craniotomy. Sinuses/Orbits: Moderate opacification of the left ethmoid sinus. Normal orbits. IMPRESSION: 1. No acute abnormality. 2. Unchanged ventriculomegaly, generalized atrophy, right frontal encephalomalacia and findings of chronic microvascular ischemia 3. 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 Electronically Signed   By: Ulyses Jarred M.D.   On: 08/19/2016 22:10   Ct Chest Wo Contrast  Result Date: 08/23/2016 CLINICAL DATA:  Anemia. Former smoker. Left lower lobe process seen on recent abdominal CT. EXAM: CT CHEST WITHOUT CONTRAST TECHNIQUE: Multidetector CT imaging of the chest was performed following the standard protocol without IV contrast. COMPARISON:  Chest radiographs 08/19/2015.  Abdominal CT 08/20/2016 FINDINGS: Cardiovascular: Advanced aortic atherosclerosis with tortuosity. Multi chamber cardiomegaly. Mitral annulus and coronary artery calcifications. Small pericardial effusion. Mediastinum/Nodes: Prominent lower paratracheal node measures 10 mm short axis. There is an 8 mm high mediastinal node. Limited assessment for hilar adenopathy given lack of IV contrast. High-density in the distal esophagus is likely ingested material. Small bilateral axillary lymph nodes. Lungs/Pleura: Volume loss in the left lower lobe. There is abnormal left perihilar soft tissue density, ill-defined causing narrowing of the segmental left lower lobe bronchi. Complete occlusion of the posterior basal segmental bronchus. There is filling of the lateral basal lower lobe segmental and subsegmental branches. Moderate consolidation in the posteromedial left lower lobe. Small left pleural effusion.  Minimal patchy opacities in the lingula. Minimal emphysema. There is an irregular ground-glass opacity in the basal most right lower lobe measuring 6 mm. Likely adjacent atelectasis. No right pleural effusion. No evidence of pulmonary edema. Upper Abdomen: No adrenal nodule.  No discrete abnormality. Musculoskeletal: Mild body wall edema. Prominent Schmorl's node within superior endplate of F75 vertebral body with minimal loss of height. No blastic or destructive lytic lesions. IMPRESSION: 1. Ill-defined abnormal left perihilar soft tissue density causing bronchial narrowing and exclusion of the posterior basal left lower lobe segmental bronchus. Associated consolidation in the left lower lobe may be postobstructive atelectasis. There is bronchial filling of the lateral basal segment in the left lower lobe. Findings are suspicious for occult perihilar malignancy. Aspiration could produce similar bronchial filling. Bronchoscopy may be helpful. Alternatively, PET-CT could be considered. Small left pleural effusion. 2. Nonspecific irregular 6 mm ground-glass nodular opacity in the basal most right lower lobe, possibly simply atelectasis. 3. Prominent lower paratracheal node measuring 10 mm, this is nonspecific and may be reactive, neoplastic  adenopathy is considered pending left lower lobe process. 4. Advanced aortic atherosclerosis.  Coronary artery calcifications. 5. Focal hyperdensity in the distal esophagus is likely ingested material, such as a pill. Electronically Signed   By: Jeb Levering M.D.   On: 08/23/2016 00:09   Dg Chest Port 1 View  Result Date: 08/18/2016 CLINICAL DATA:  Cough and weakness EXAM: PORTABLE CHEST 1 VIEW COMPARISON:  April 14, 2016 FINDINGS: There is a minimal left pleural effusion with mild left base atelectasis. Lungs elsewhere are clear. Heart is borderline enlarged with pulmonary vascularity within normal limits. There is aortic atherosclerosis. No adenopathy. No bone lesions.  IMPRESSION: Minimal left pleural effusion with left base atelectasis. Lungs elsewhere clear. Borderline cardiac enlargement with aortic atherosclerosis. Electronically Signed   By: Lowella Grip III M.D.   On: 08/18/2016 13:42   Dg Foot 2 Views Left  Result Date: 08/18/2016 CLINICAL DATA:  Nonhealing open wound of heel. Left foot pain and swelling. EXAM: LEFT FOOT - 2 VIEW COMPARISON:  Calcaneal radiographs 06/26/2016 FINDINGS: Minimal skin irregularity overlies the posterior calcaneus. No radiopaque foreign body. No periosteal reaction or bony destructive change to suggest osteomyelitis. Minimal enthesopathic changes noted at the Achilles tendon insertion, stable from prior exam. No acute fracture or subluxation. Hammertoe deformity of the second through fifth digits noted. There is mild diffuse soft tissue edema of the foot. IMPRESSION: Skin irregularity of the posterior heel consistent with soft tissue ulcer. No radiopaque foreign body or findings to suggest osteomyelitis. Electronically Signed   By: Jeb Levering M.D.   On: 08/18/2016 21:35    Labs:  CBC:  Recent Labs  08/21/16 0417 08/21/16 1813 08/22/16 0650 08/23/16 0409  WBC 13.8* 11.1* 11.0* 11.3*  HGB 7.0* 8.4* 7.6* 7.7*  HCT 21.9* 25.1* 24.0* 24.3*  PLT 236 245 253 269    COAGS:  Recent Labs  04/14/16 1612 08/07/16 1200  INR 0.91 0.9    BMP:  Recent Labs  08/07/16 1200 08/18/16 1348 08/19/16 0454 08/20/16 0417  NA 138 138 140 140  K 4.7 5.2* 4.6 4.2  CL 104 104 110 112*  CO2 32* 22 22 21*  GLUCOSE 93 143* 102* 101*  BUN 25 41* 34* 27*  CALCIUM 9.8 8.4* 8.2* 8.1*  CREATININE 1.40* 1.83* 1.29* 1.13  GFRNONAA 48* 34* 52* >60  GFRAA 56* 39* >60 >60    LIVER FUNCTION TESTS:  Recent Labs  04/25/16 2009 06/19/16 1051 08/18/16 1348 08/21/16 1144  BILITOT 0.8 0.3 0.5 0.5  AST 33 '13 29 25  ' ALT 30 6* 22 20  ALKPHOS 81 64 55 56  PROT 5.5* 6.7 6.8 6.6  ALBUMIN 2.1* 3.3* 2.9* 2.3*    TUMOR  MARKERS: No results for input(s): AFPTM, CEA, CA199, CHROMGRNA in the last 8760 hours.  Assessment and Plan:  Anemia  Will plan for bone marrow biopsy on Monday. Will need to alert pathology on Monday.  Risks and Benefits discussed with the patient including, but not limited to bleeding, infection, damage to adjacent structures or low yield requiring additional tests.  Will need to obtain consent from a family member. I will attempt to get consent if family visits over the weekend.  Thank you for this interesting consult.  I greatly enjoyed meeting [name] and look forward to participating in their care.  A copy of this report was sent to the requesting provider on this date.  Electronically Signed: Murrell Redden, [name] 08/23/2016, 8:28 AM   I spent a  total of 20 Minutes in face to face in clinical consultation, greater than 50% of which was counseling/coordinating care for bone marrow biopsy      '

## 2016-08-23 NOTE — Progress Notes (Signed)
Report called to [facility] to [facility]. Iv removed and discharge instruction reviewed with the niece. Patient has denture with him. Room check and free of any personal belongings.

## 2016-08-23 NOTE — Clinical Social Work Placement (Signed)
   CLINICAL SOCIAL WORK PLACEMENT  NOTE  Date:  08/23/2016  Patient Details  Name: [name] MRN: [id number] Date of Birth: 10-Oct-1940  Clinical Social Work is seeking post-discharge placement for this patient at the [place] level of care (*CSW will initial, date and re-position this form in  chart as items are completed):  Yes   Patient/family provided with [facility] Work Department's list of facilities offering this level of care within the geographic area requested by the patient (or if unable, by the patient's family).  Yes   Patient/family informed of their freedom to choose among providers that offer the needed level of care, that participate in Medicare, Medicaid or managed care program needed by the patient, have an available bed and are willing to accept the patient.  Yes   Patient/family informed of [facility]'s ownership interest in [facility] and [facility], as well as of the fact that they are under no obligation to receive care at these facilities.  PASRR submitted to EDS on       PASRR number received on       Existing PASRR number confirmed on 08/21/16     FL2 transmitted to all facilities in geographic area requested by pt/family on 08/21/16     FL2 transmitted to all facilities within larger geographic area on       Patient informed that his/her managed care company has contracts with or will negotiate with certain facilities, including the following:        Yes   Patient/family informed of bed offers received.  Patient chooses bed at  [facility])     Physician recommends and patient chooses bed at      Patient to be transferred to  [facility]) on 08/23/16.  Patient to be transferred to facility by  [facility])     Patient family notified on 08/23/16 of transfer.  Name of family member notified:  Hollace Hayward [phone]     PHYSICIAN       Additional Comment:     _______________________________________________ Serafina Mitchell, [place] 08/23/2016, 12:33 PM

## 2016-08-23 NOTE — Consult Note (Addendum)
PULMONARY / CRITICAL CARE MEDICINE   Name: [name] MRN: [id number] DOB: 14-Mar-1941    ADMISSION DATE:  08/18/2016 CONSULTATION DATE:  08/23/16  REFERRING MD:  Tera Partridge, MD  CHIEF COMPLAINT:  Abnormal CT  HISTORY OF PRESENT ILLNESS:   76 year old with history of COPD, hypertension, hyperlipidemia, active tobacco and alcohol use, seizure disorder, brain aneurysm. He is undergoing workup for peripheral arterial disease, nonhealing left foot ulcer and underwent angiogram, had an unsuccessful attempt at angioplasty to the left SFA. He was scheudle for a repeat procedure on 5/16 but got got admitted before that.   His admitted on 08/18/16 with cough, fever, rash, confusion, elevated LA, AKI. He is being treated for LLL pneumonia with vanco, cefepime initially, This has been changed to oral vantin on 5/16. He is being evaluated for anemia with abnormal blood smear. He is scheduled for a bone marrow biopsy on 5/21 to evaluate myelodysplastic syndrome.   He had a CT abdomen pelvis and a CT chest which showed left lower lobe consolidation with narrowing of lower lobe atelectasis, mediastinal adenopathy. PCCM consulted for further management  PAST MEDICAL HISTORY :  He  has a past medical history of Alcohol abuse; Aneurysm ([place]); CKD (chronic kidney disease), stage III; COPD (chronic obstructive pulmonary disease) ([place]); Fever of unknown origin (08/2016); Hyperchloremia; Hypercholesteremia; Hyperpotassemia; Hypertension; Hypertensive renal disease, benign; Seizures ([place]); and Tobacco use.  PAST SURGICAL HISTORY: He  has a past surgical history that includes Cerebral aneurysm repair (Mar 23, 1996); Eye surgery; Abdominal Aortogram w/Lower Extremity (N/A, 08/13/2016); and Peripheral Vascular Balloon Angioplasty (08/13/2016).  Allergies  Allergen Reactions  . Diltiazem Rash and Other (See Comments)    Blisters  . Penicillins Other (See Comments)    Unknown Childhood allergy  Has patient had a PCN  reaction causing immediate rash, facial/tongue/throat swelling, SOB or lightheadedness with hypotension: unknown Has patient had a PCN reaction causing severe rash involving mucus membranes or skin necrosis: unknown Has patient had a PCN reaction that required hospitalization unknown Has patient had a PCN reaction occurring within the last 10 years:no If all of the above answers are "NO", then may proceed with Cephalosporin use.     No current facility-administered medications on file prior to encounter.    Current Outpatient Prescriptions on File Prior to Encounter  Medication Sig  . aspirin EC 81 MG tablet Take 1 tablet (81 mg total) by mouth daily.  Marland Kitchen atorvastatin (LIPITOR) 10 MG tablet TAKE 1 TABLET BY MOUTH EVERY DAY  . carvedilol (COREG) 6.25 MG tablet TAKE 1 TABLET (6.25 MG TOTAL) BY MOUTH 2 (TWO) TIMES DAILY WITH A MEAL.  . CVS ARTIFICIAL TEARS 1-0.3 % SOLN PLACE 1 DROP IN EACHEYE EVERY 6 HOURS AS NEEDED FOR DRY EYES  . CVS B-1 100 MG tablet TAKE 1 TABLET BY MOUTH EVERY DAY  . divalproex (DEPAKOTE) 250 MG DR tablet TAKE 1 TABLET BY MOUTH 3 TIMES A DAY FOR SEIZURES  . furosemide (LASIX) 40 MG tablet TAKE 1 TABLET BY MOUTH ONCE DAILY FOR EDEMA  . levETIRAcetam (KEPPRA) 500 MG tablet Take one tablet by mouth twice daily  . LORazepam (ATIVAN) 0.5 MG tablet TAKE 1 TABLET BY MOUTH EVERY 12 HOURS  . Multiple Vitamins-Minerals (THEREMS-M) TABS Take 1 tablet by mouth daily.    FAMILY HISTORY:  His indicated that his mother is deceased. He indicated that his father is deceased. He indicated that two of his five sisters are alive. He indicated that four of his five brothers  are alive. He indicated that his son is alive.    SOCIAL HISTORY: He  reports that he has quit smoking. He quit after 56.00 years of use. He has never used smokeless tobacco. He reports that he drinks alcohol. He reports that he does not use drugs.  REVIEW OF SYSTEMS:   Denies any cough, sputum production,  wheezing Denies any chest pain, palpitations Denies any nausea, vomiting, diarrhea, constipation. All other review of systems are negative. Patient is a poor historian.  SUBJECTIVE:    VITAL SIGNS: BP (!) 162/45 (BP Location: Left Arm)   Pulse 63   Temp 99.4 F (37.4 C) (Oral)   Resp 18   Ht '5\' 6"'  (1.676 m)   Wt 122 lb 5.7 oz (55.5 kg)   SpO2 97%   BMI 19.75 kg/m   HEMODYNAMICS:    VENTILATOR SETTINGS:    INTAKE / OUTPUT: I/O last 3 completed shifts: In: 320 [P.O.:320] Out: 150 [Urine:150]  PHYSICAL EXAMINATION: Gen:      No acute distress HEENT:  EOMI, sclera anicteric Neck:     No masses; no thyromegaly Lungs:    Clear to auscultation bilaterally; normal respiratory effort CV:         Regular rate and rhythm; no murmurs Abd:      + bowel sounds; soft, non-tender; no palpable masses, no distension Ext:    No edema; adequate peripheral perfusion Skin:      Warm and dry; no rash Neuro: alert and oriented x 3 Psych: normal mood and affect  LABS:  BMET  Recent Labs Lab 08/18/16 1348 08/19/16 0454 08/20/16 0417  NA 138 140 140  K 5.2* 4.6 4.2  CL 104 110 112*  CO2 22 22 21*  BUN 41* 34* 27*  CREATININE 1.83* 1.29* 1.13  GLUCOSE 143* 102* 101*    Electrolytes  Recent Labs Lab 08/18/16 1348 08/19/16 0454 08/20/16 0417  CALCIUM 8.4* 8.2* 8.1*    CBC  Recent Labs Lab 08/21/16 1813 08/22/16 0650 08/23/16 0409  WBC 11.1* 11.0* 11.3*  HGB 8.4* 7.6* 7.7*  HCT 25.1* 24.0* 24.3*  PLT 245 253 269    Coag's No results for input(s): APTT, INR in the last 168 hours.  Sepsis Markers  Recent Labs Lab 08/18/16 2150 08/18/16 2355 08/19/16 0454  LATICACIDVEN 2.1* 1.2 1.0    ABG No results for input(s): PHART, PCO2ART, PO2ART in the last 168 hours.  Liver Enzymes  Recent Labs Lab 08/18/16 1348 08/21/16 1144  AST 29 25  ALT 22 20  ALKPHOS 55 56  BILITOT 0.5 0.5  ALBUMIN 2.9* 2.3*    Cardiac Enzymes No results for input(s):  TROPONINI, PROBNP in the last 168 hours.  Glucose No results for input(s): GLUCAP in the last 168 hours.  Imaging CT abdomen pelvis 5/16> left lower lobe consolidation with small effusion. CT chest 5/18> ill-defined left perihilar soft tissue density with bronchial narrowing in posterobasal segments, left lower lobe consolidation.  I have reviewed all images personally  STUDIES:    CULTURES: Bcx 5/14 > negative  ANTIBIOTICS: Vanco 5/15 > 5/16 Cefepime  5/15 > 5/16 Cefpodoxime 5/16 >   SIGNIFICANT EVENTS:   LINES/TUBES:   DISCUSSION: 76 year old with left lower lobe pneumonia. CT scan shows possible left perihilar region with bronchial narrowing, mediastinal lymphadenopathy.  Given his smoking history he is at risk for cancer however this is likely secondary to pneumonia, mucus in the airways. I will continue treatment for pneumonia and repeat a CT in 1  month time to ensure improvement in CT. If abnormalities persist then we can schedule for bronchoscopy. No role for PET scan at this point   - Continue treatment with abx - Chest PT, incentive spirometer and flutter valve for clearance of mucus - Follow up CT scan in 4 weeks as outpt. We will order the scan and follow up.  PCCM will be available as needed. Discussed case and follow up with Dr. Margy Clarks MD  Pulmonary and Critical Care Pager [phone] If no answer or after 3pm call: [phone] 08/23/2016, 10:41 AM

## 2016-08-25 ENCOUNTER — Telehealth: Payer: Self-pay

## 2016-08-25 DIAGNOSIS — R918 Other nonspecific abnormal finding of lung field: Secondary | ICD-10-CM

## 2016-08-25 NOTE — Telephone Encounter (Signed)
-----   Message from Marshell Garfinkel, MD sent at 08/23/2016 10:46 AM EDT ----- Regarding: Hospital follow up Please make a follow up CT chest without contrast in 4-6 weeks for mr Schwall and then clinic visit with me. OK to use held 15 min slot. Thanks  PM

## 2016-08-25 NOTE — Telephone Encounter (Deleted)
toc

## 2016-08-25 NOTE — Telephone Encounter (Signed)
This is a patient of [place], who was admitted to SNF after hospitalization. [facility] F/U is needed. Hospital discharge from [facility] on 08/23/2016.

## 2016-08-25 NOTE — Telephone Encounter (Signed)
Pt has been scheduled for HFU on 10/21/16 @ 3:45. CT has been ordered. [facility]) is aware and voiced her understanding. Nothing further needed.

## 2016-08-26 LAB — MULTIPLE MYELOMA PANEL, SERUM
ALPHA2 GLOB SERPL ELPH-MCNC: 0.7 g/dL (ref 0.4–1.0)
Albumin SerPl Elph-Mcnc: 2.2 g/dL — ABNORMAL LOW (ref 2.9–4.4)
Albumin/Glob SerPl: 0.7 (ref 0.7–1.7)
Alpha 1: 0.3 g/dL (ref 0.0–0.4)
B-Globulin SerPl Elph-Mcnc: 1.1 g/dL (ref 0.7–1.3)
GAMMA GLOB SERPL ELPH-MCNC: 1.1 g/dL (ref 0.4–1.8)
GLOBULIN, TOTAL: 3.3 g/dL (ref 2.2–3.9)
IGM, SERUM: 67 mg/dL (ref 15–143)
IgA: 612 mg/dL — ABNORMAL HIGH (ref 61–437)
IgG (Immunoglobin G), Serum: 1134 mg/dL (ref 700–1600)
Total Protein ELP: 5.5 g/dL — ABNORMAL LOW (ref 6.0–8.5)

## 2016-09-01 ENCOUNTER — Other Ambulatory Visit: Payer: Self-pay | Admitting: Internal Medicine

## 2016-09-03 ENCOUNTER — Other Ambulatory Visit: Payer: Self-pay | Admitting: Oncology

## 2016-09-03 ENCOUNTER — Ambulatory Visit: Payer: Medicare Other | Admitting: Cardiology

## 2016-09-03 ENCOUNTER — Telehealth: Payer: Self-pay | Admitting: *Deleted

## 2016-09-03 DIAGNOSIS — D649 Anemia, unspecified: Secondary | ICD-10-CM

## 2016-09-03 NOTE — Telephone Encounter (Signed)
Discussed with Dr. Benay Spice. MD will enter order. Message sent to scheduler for office visit appt.

## 2016-09-03 NOTE — Telephone Encounter (Signed)
Call from IR- discharge summary stated pt needs bone marrow biopsy. They do not have an order. Message to MD.

## 2016-09-03 NOTE — Progress Notes (Deleted)
Cardiology Office Note    Date:  09/03/2016   ID:  [name], DOB Jan 25, 1941, MRN [id number]  PCP:  [name], NP  Cardiologist:  Fransico Him, MD   No chief complaint on file.   History of Present Illness:  [name] is male with PMH of COPD, HTN, HLD, Seizures, tobacco use, ETOH use, brain aneurysm s/p cerebral aneurysm clipping over 20 years ago. He was also recently diagnosed with atrial fibrillation. He presents to clinic today for post hospital f/u.   He was recently admitted to [facility] 04/15/16. He initially presented for evaluation for weakness resulting in a fall at home. On arrival, he was noted to be in atrial fibrillation w/ RVR. He was asymptomatic. He was placed on IV cardizem and converted to NSR. He was also started on IV heparin however he was felt not a candidate for long term oral anticoaguation given his heavy ETOH use and bleed risk. Per hospital records, his family informed that he "drinks ETOH on a daily basis, and drinks to the point he passes out."   Troponins were mildly abnormal x 3 at 0.35>>0.46>>0.72m, felt to be demand ischemia from rapid afib. He denied CP. 2D echo showed normal LVEF of 60-65% with grade 2DD and no WMA. No further ischemic w/u was recommended. He was discharged to a SNF for rehab and instructed to continue PO Cardizem.  Per family, he had an allergic reaction with a skin rash and blisters on his lower extremities. He was evaluated at [facility] and it was felt that his reaction was related to Diltiazem. This was discontinued and he was placed on Coreg, 6.25 mg BID. His condition improved. He is tolerating Coreg well w/o side effects. He denies any palpitations. Physical exam reveals RRR. HR is controlled in the upper 50s. BP is well controlled. He also denies CP and dyspnea. He continues to have mild LEE on exam but was started on HCTZ at [facility]. He is also wearing compression stockings. His rehab is not progressing as well as he  had hoped. He is still unable to walk.       Past Medical History:  Diagnosis Date  . Alcohol abuse   . Aneurysm ([place])   . CKD (chronic kidney disease), stage III   . COPD (chronic obstructive pulmonary disease) ([place])   . Fever of unknown origin 08/2016  . Hyperchloremia   . Hypercholesteremia   . Hyperpotassemia   . Hypertension   . Hypertensive renal disease, benign   . Seizures ([place])    has not had a seizure in 15 yrs  . Tobacco use     Past Surgical History:  Procedure Laterality Date  . ABDOMINAL AORTOGRAM W/LOWER EXTREMITY N/A 08/13/2016   Procedure: Abdominal Aortogram w/Lower Extremity;  Surgeon: Wellington Hampshire, MD;  Location: [place] [facility];  Service: Cardiovascular;  Laterality: N/A;  . 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, 1996  . EYE SURGERY     December 2017  . PERIPHERAL VASCULAR BALLOON ANGIOPLASTY  08/13/2016   Procedure: Peripheral Vascular Balloon Angioplasty;  Surgeon: Wellington Hampshire, MD;  Location: [place] [facility];  Service: Cardiovascular;;  Aborted    Current Medications: No outpatient prescriptions have been marked as taking for the 09/03/16 encounter (Appointment) with Sueanne Margarita, MD.    Allergies:   Diltiazem and Penicillins   Social History   Social History  . Marital status: Single    Spouse name: N/A  .  Number of children: N/A  . Years of education: N/A   Social History Main Topics  . Smoking status: Former Smoker    Years: 56.00  . Smokeless tobacco: Never Used     Comment: On average about 6 cig daily   . Alcohol use 0.0 oz/week     Comment: QUIT DRINKING  04/2016  . Drug use: No  . Sexual activity: Not Currently   Other Topics Concern  . Not on file   Social History Narrative  . No narrative on file     Family History:  The patient's ***family history includes Alzheimer's disease in his sister; Cancer in his mother; Clotting disorder in his sister and sister; Congestive Heart Failure in his brother, father, and  sister.   ROS:   Please see the history of present illness.    ROS All other systems reviewed and are negative.  No flowsheet data found.     PHYSICAL EXAM:   VS:  There were no vitals taken for this visit.   GEN: Well nourished, well developed, in no acute distress  HEENT: normal  Neck: no JVD, carotid bruits, or masses Cardiac: ***RRR; no murmurs, rubs, or gallops,no edema.  Intact distal pulses bilaterally.  Respiratory:  clear to auscultation bilaterally, normal work of breathing GI: soft, nontender, nondistended, + BS MS: no deformity or atrophy  Skin: warm and dry, no rash Neuro:  Alert and Oriented x 3, Strength and sensation are intact Psych: euthymic mood, full affect  Wt Readings from Last 3 Encounters:  08/19/16 122 lb 5.7 oz (55.5 kg)  08/13/16 160 lb (72.6 kg)  08/05/16 123 lb 3.2 oz (55.9 kg)      Studies/Labs Reviewed:   EKG:  EKG is*** ordered today.  The ekg ordered today demonstrates ***  Recent Labs: 04/14/2016: B Natriuretic Peptide 1,172.2; Magnesium 1.9 08/19/2016: TSH 3.541 08/20/2016: BUN 27; Creatinine, Ser 1.13; Potassium 4.2; Sodium 140 08/21/2016: ALT 20 08/23/2016: Hemoglobin 7.7; Platelets 269   Lipid Panel    Component Value Date/Time   CHOL 164 04/15/2016 0211   CHOL 206 (H) 07/13/2014 1130   TRIG 48 04/15/2016 0211   HDL 103 04/15/2016 0211   HDL 112 07/13/2014 1130   CHOLHDL 1.6 04/15/2016 0211   VLDL 10 04/15/2016 0211   LDLCALC 51 04/15/2016 0211   LDLCALC 83 07/13/2014 1130    Additional studies/ records that were reviewed today include:  ***    ASSESSMENT:    No diagnosis found.   PLAN:  In order of problems listed above:  1. ***    Medication Adjustments/Labs and Tests Ordered: Current medicines are reviewed at length with the patient today.  Concerns regarding medicines are outlined above.  Medication changes, Labs and Tests ordered today are listed in the Patient Instructions below.  There are no Patient  Instructions on file for this visit.   Signed, Fransico Him, MD  09/03/2016 8:01 AM    [facility] [address], [place], [place]  [address] Phone: [phone]; Fax: [phone]

## 2016-09-03 NOTE — Telephone Encounter (Signed)
Bone marrow ordered

## 2016-09-04 ENCOUNTER — Telehealth: Payer: Self-pay | Admitting: Nurse Practitioner

## 2016-09-04 ENCOUNTER — Encounter: Payer: Self-pay | Admitting: Nurse Practitioner

## 2016-09-04 ENCOUNTER — Encounter: Payer: Self-pay | Admitting: Cardiology

## 2016-09-04 NOTE — Telephone Encounter (Signed)
Hospital fu appt has been scheduled for the pt to see lisa on 6/12 at 245pm. Will mail the pt w/the appt date and time.

## 2016-09-05 ENCOUNTER — Ambulatory Visit: Payer: Medicare Other | Admitting: Physician Assistant

## 2016-09-06 ENCOUNTER — Other Ambulatory Visit: Payer: Self-pay | Admitting: Nurse Practitioner

## 2016-09-08 ENCOUNTER — Telehealth: Payer: Self-pay | Admitting: Oncology

## 2016-09-08 ENCOUNTER — Telehealth: Payer: Self-pay | Admitting: *Deleted

## 2016-09-08 NOTE — Telephone Encounter (Signed)
Call placed to patient's POA to review the CT bone marrow biopsy instructions per request of A. Brailsford.  Instructions reviewed with Hollace Hayward and teach back done.  No further questions at this time.

## 2016-09-08 NOTE — Telephone Encounter (Signed)
Spoke to Ms. Wallis to reschedule Mr. Arel's appt to see Dr. Benay Spice on 6/19 at 1230pm. [facility] has agreed to the appt date and time. Aware to arrive 30 minutes early.

## 2016-09-09 ENCOUNTER — Ambulatory Visit (INDEPENDENT_AMBULATORY_CARE_PROVIDER_SITE_OTHER): Payer: Medicare Other | Admitting: Cardiovascular Disease

## 2016-09-09 ENCOUNTER — Encounter: Payer: Self-pay | Admitting: Cardiovascular Disease

## 2016-09-09 VITALS — BP 119/73 | HR 77 | Ht 66.0 in | Wt 124.0 lb

## 2016-09-09 DIAGNOSIS — I480 Paroxysmal atrial fibrillation: Secondary | ICD-10-CM

## 2016-09-09 DIAGNOSIS — Z720 Tobacco use: Secondary | ICD-10-CM

## 2016-09-09 DIAGNOSIS — I739 Peripheral vascular disease, unspecified: Secondary | ICD-10-CM | POA: Diagnosis not present

## 2016-09-09 DIAGNOSIS — E785 Hyperlipidemia, unspecified: Secondary | ICD-10-CM | POA: Diagnosis not present

## 2016-09-09 NOTE — Patient Instructions (Signed)
Medication Instructions:  Your physician recommends that you continue on your current medications as directed. Please refer to the Current Medication list given to you today.  Labwork: No new orders.   Testing/Procedures: No new orders.   Follow-Up: Your physician recommends that you schedule a follow-up appointment in: 3 MONTHS with Dr Arida   Any Other Special Instructions Will Be Listed Below (If Applicable).     If you need a refill on your cardiac medications before your next appointment, please call your pharmacy.   

## 2016-09-09 NOTE — Progress Notes (Signed)
Cardiology Office Note   Date:  09/09/2016   ID:  [name], DOB Dec 17, 1940, MRN [id number]  PCP:  [name], NP  Cardiologist: Dr. Radford Pax  Chief Complaint  Patient presents with  . Follow-up      History of Present Illness: [name] is a 76 y.o. male who Is here today for a follow-up visit regarding peripheral arterial disease. 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 He was not felt to be a good candidate for anticoagulation given history of heavy alcohol use. Echocardiogram showed normal LV systolic function. He had a rash with diltiazem and was subsequently switched to carvedilol. He was seen for a pressure ulcer on the left heel.  He underwent noninvasive vascular evaluation in our office which showed an ABI of 0.52 on the left with significant left common and external iliac artery stenosis with velocity greater than 350 and an occluded left mid SFA. Angiography was performed last month which showed moderate heavily calcified iliac disease bilaterally with short occlusion of the left distal SFA with three-vessel runoff below the knee. Attempted angioplasty of the left SFA was not successful due to inability to cross the occlusion. He was scheduled for a staged procedure via the retrograde anterior tibial artery access. However, he was hospitalized for low-grade fever, cough and anemia. He was treated for pneumonia. No obvious source of bleeding was identified including CT scan of the abdomen and pelvis. He was seen by hematology and he is supposed. Bone marrow biopsy. He was discharged to rehabilitation and has been doing reasonably well there. The ulceration on the left foot is improving significantly and almost healed. He had problems with leg edema after the dose of Lasix was decreased. It was  increased again to 40 mg twice daily with improvement.    Past Medical History:  Diagnosis Date  . Alcohol abuse   . Aneurysm ([place])   . CKD (chronic kidney disease), stage III   . COPD (chronic obstructive pulmonary disease) ([place])   . Fever of unknown origin 08/2016  . Hyperchloremia   . Hypercholesteremia   . Hyperpotassemia   . Hypertension   . Hypertensive renal disease, benign   . Seizures ([place])    has not had a seizure in 15 yrs  . Tobacco use     Past Surgical History:  Procedure Laterality Date  . ABDOMINAL AORTOGRAM W/LOWER EXTREMITY N/A 08/13/2016   Procedure: Abdominal Aortogram w/Lower Extremity;  Surgeon: Wellington Hampshire, MD;  Location: [place] [facility];  Service: Cardiovascular;  Laterality: N/A;  . 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, 1996  . EYE SURGERY     December 2017  . PERIPHERAL VASCULAR BALLOON ANGIOPLASTY  08/13/2016   Procedure: Peripheral Vascular Balloon Angioplasty;  Surgeon: Wellington Hampshire, MD;  Location: [place] [facility];  Service: Cardiovascular;;  Aborted     Current Outpatient Prescriptions  Medication Sig Dispense Refill  . aspirin EC 81 MG tablet Take 1 tablet (81 mg total) by mouth daily. 90 tablet 3  . atorvastatin (LIPITOR) 10 MG tablet TAKE 1 TABLET BY MOUTH EVERY DAY 90 tablet 1  . carvedilol (COREG) 6.25 MG tablet TAKE 1 TABLET (6.25 MG TOTAL) BY MOUTH 2 (TWO) TIMES DAILY WITH A MEAL. 60 tablet 5  . cefpodoxime (VANTIN) 200 MG tablet Take 1 tablet (200 mg total) by  mouth every 12 (twelve) hours. Discontinue after 08/24/16 doses.    . CVS ARTIFICIAL TEARS 1-0.3 % SOLN PLACE 1 DROP IN EACHEYE EVERY 6 HOURS AS NEEDED FOR DRY EYES  0  . CVS B-1 100 MG tablet TAKE 1 TABLET BY MOUTH EVERY DAY 30 tablet 3  . divalproex (DEPAKOTE) 250 MG DR tablet TAKE 1 TABLET BY MOUTH 3 TIMES A DAY FOR SEIZURES 90 tablet 1  . furosemide (LASIX) 20 MG tablet Take 1 tablet (20 mg total) by mouth every other day.    . guaiFENesin (ROBITUSSIN) 100 MG/5ML liquid  Take 10 mg by mouth 3 (three) times daily as needed for cough.    . levETIRAcetam (KEPPRA) 500 MG tablet Take one tablet by mouth twice daily 180 tablet 1  . Multiple Vitamins-Minerals (THEREMS-M) TABS Take 1 tablet by mouth daily. 90 tablet 3   No current facility-administered medications for this visit.     Allergies:   Diltiazem and Penicillins    Social History:  The patient  reports that he has quit smoking. He quit after 56.00 years of use. He has never used smokeless tobacco. He reports that he drinks alcohol. He reports that he does not use drugs.   Family History:  The patient's family history includes Alzheimer's disease in his sister; Cancer in his mother; Clotting disorder in his sister and sister; Congestive Heart Failure in his brother, father, and sister.    ROS:  Please see the history of present illness.   Otherwise, review of systems are positive for none.   All other systems are reviewed and negative.    PHYSICAL EXAM: VS:  BP 119/73 (BP Location: Left Arm, Patient Position: Sitting, Cuff Size: Normal)   Pulse 77   Ht 5' 6" (1.676 m)   Wt 124 lb (56.2 kg)   BMI 20.01 kg/m  , BMI Body mass index is 20.01 kg/m. GEN: Well nourished, well developed, in no acute distress  HEENT: normal  Neck: no JVD, carotid bruits, or masses Cardiac: RRR; no ubs, or gall+2 edema . .6 systolic ejection murmur at the aortic area  Respiratory:  clear to auscultation bilaterally, normal work of breathing GI: soft, nontender, nondistended, + BS MS: no deformity or atrophy  Skin: warm and dry, no rash Neuro:  Strength and sensation are intact Psych: euthymic mood, full affect Vascular: Femoral pulses +1 bilaterally with bruits. Distal pulses are not palpable. T  EKG:  EKG is not ordered today.    Recent Labs: 04/14/2016: B Natriuretic Peptide 1,172.2; Magnesium 1.9 08/19/2016: TSH 3.541 08/20/2016: BUN 27; Creatinine, Ser 1.13; Potassium 4.2; Sodium 140 08/21/2016: ALT 20 08/23/2016:  Hemoglobin 7.7; Platelets 269    Lipid Panel    Component Value Date/Time   CHOL 164 04/15/2016 0211   CHOL 206 (H) 07/13/2014 1130   TRIG 48 04/15/2016 0211   HDL 103 04/15/2016 0211   HDL 112 07/13/2014 1130   CHOLHDL 1.6 04/15/2016 0211   VLDL 10 04/15/2016 0211   LDLCALC 51 04/15/2016 0211   LDLCALC 83 07/13/2014 1130      Wt Readings from Last 3 Encounters:  09/09/16 124 lb (56.2 kg)  08/19/16 122 lb 5.7 oz (55.5 kg)  08/13/16 160 lb (72.6 kg)       No flowsheet data found.    ASSESSMENT AND PLAN:  1.  Peripheral arterial disease: The patient has nonhealing pressure ulcer affecting the left heel. However, this has improved significantly and it's almost completely healed. Thus, there is   no indication for revascularization at the present time. Continue medical therapy.  I will have him follow-up with me in 3 months for reevaluation.   2. Hyperlipidemia: Continue treatment with atorvastatin.  3. [place] not been smoking since he was admitted to rehabilitation.  4. Paroxysmal atrial fibrillation: Currently in regular rhythm. Not a candidate for anticoagulation.    5. Anemia with significant weight loss: It appears that the patient lost at least 30 pounds since last month. The exact etiology is not entirely clear but underlying malignancy should be excluded.  6. Bilateral leg edema: There is likely a component of chronic venous insufficiency. He is currently on furosemide 40 mg twice daily. I advised him to elevate his legs 3 times daily.   Disposition:   FU with me in 3 months  Signed,  Kathlyn Sacramento, MD  09/09/2016 9:29 AM    [facility]

## 2016-09-16 ENCOUNTER — Inpatient Hospital Stay: Payer: Medicare Other | Admitting: Nurse Practitioner

## 2016-09-16 ENCOUNTER — Other Ambulatory Visit: Payer: Medicare Other

## 2016-09-17 ENCOUNTER — Other Ambulatory Visit: Payer: Self-pay | Admitting: Radiology

## 2016-09-18 ENCOUNTER — Ambulatory Visit (HOSPITAL_COMMUNITY)
Admission: RE | Admit: 2016-09-18 | Discharge: 2016-09-18 | Disposition: A | Discharge status: Home / Self Care | Payer: Medicare Other | Source: Ambulatory Visit | Attending: Oncology | Admitting: Oncology

## 2016-09-18 ENCOUNTER — Encounter: Payer: Medicare Other | Admitting: Nurse Practitioner

## 2016-09-18 ENCOUNTER — Ambulatory Visit: Payer: Medicare Other

## 2016-09-18 ENCOUNTER — Encounter (HOSPITAL_COMMUNITY): Payer: Self-pay

## 2016-09-18 DIAGNOSIS — Z880 Allergy status to penicillin: Secondary | ICD-10-CM | POA: Diagnosis not present

## 2016-09-18 DIAGNOSIS — Z79899 Other long term (current) drug therapy: Secondary | ICD-10-CM | POA: Insufficient documentation

## 2016-09-18 DIAGNOSIS — Z87891 Personal history of nicotine dependence: Secondary | ICD-10-CM | POA: Diagnosis not present

## 2016-09-18 DIAGNOSIS — Z7982 Long term (current) use of aspirin: Secondary | ICD-10-CM | POA: Insufficient documentation

## 2016-09-18 DIAGNOSIS — Z8249 Family history of ischemic heart disease and other diseases of the circulatory system: Secondary | ICD-10-CM | POA: Diagnosis not present

## 2016-09-18 DIAGNOSIS — N183 Chronic kidney disease, stage 3 (moderate): Secondary | ICD-10-CM | POA: Diagnosis not present

## 2016-09-18 DIAGNOSIS — E7800 Pure hypercholesterolemia, unspecified: Secondary | ICD-10-CM | POA: Diagnosis not present

## 2016-09-18 DIAGNOSIS — Z809 Family history of malignant neoplasm, unspecified: Secondary | ICD-10-CM | POA: Insufficient documentation

## 2016-09-18 DIAGNOSIS — Z888 Allergy status to other drugs, medicaments and biological substances status: Secondary | ICD-10-CM | POA: Insufficient documentation

## 2016-09-18 DIAGNOSIS — I129 Hypertensive chronic kidney disease with stage 1 through stage 4 chronic kidney disease, or unspecified chronic kidney disease: Secondary | ICD-10-CM | POA: Insufficient documentation

## 2016-09-18 DIAGNOSIS — D649 Anemia, unspecified: Secondary | ICD-10-CM | POA: Diagnosis present

## 2016-09-18 DIAGNOSIS — Z820 Family history of epilepsy and other diseases of the nervous system: Secondary | ICD-10-CM | POA: Diagnosis not present

## 2016-09-18 DIAGNOSIS — J449 Chronic obstructive pulmonary disease, unspecified: Secondary | ICD-10-CM | POA: Insufficient documentation

## 2016-09-18 DIAGNOSIS — Z9889 Other specified postprocedural states: Secondary | ICD-10-CM | POA: Insufficient documentation

## 2016-09-18 DIAGNOSIS — Z832 Family history of diseases of the blood and blood-forming organs and certain disorders involving the immune mechanism: Secondary | ICD-10-CM | POA: Diagnosis not present

## 2016-09-18 HISTORY — DX: Anemia, unspecified: D64.9

## 2016-09-18 HISTORY — DX: Abnormal weight loss: R63.4

## 2016-09-18 HISTORY — DX: Cardiac arrhythmia, unspecified: I49.9

## 2016-09-18 HISTORY — DX: Pneumonia, unspecified organism: J18.9

## 2016-09-18 HISTORY — DX: Localized edema: R60.0

## 2016-09-18 HISTORY — DX: Peripheral vascular disease, unspecified: I73.9

## 2016-09-18 HISTORY — DX: Personal history of other diseases of the circulatory system: Z86.79

## 2016-09-18 HISTORY — DX: Pressure ulcer of left heel, unspecified stage: L89.629

## 2016-09-18 LAB — CBC WITH DIFFERENTIAL/PLATELET
BASOS ABS: 0 10*3/uL (ref 0.0–0.1)
Basophils Relative: 0 %
EOS PCT: 6 %
Eosinophils Absolute: 0.3 10*3/uL (ref 0.0–0.7)
HEMATOCRIT: 28.7 % — AB (ref 39.0–52.0)
Hemoglobin: 9.2 g/dL — ABNORMAL LOW (ref 13.0–17.0)
LYMPHS ABS: 3.2 10*3/uL (ref 0.7–4.0)
LYMPHS PCT: 55 %
MCH: 27.1 pg (ref 26.0–34.0)
MCHC: 32.1 g/dL (ref 30.0–36.0)
MCV: 84.7 fL (ref 78.0–100.0)
MONO ABS: 0.4 10*3/uL (ref 0.1–1.0)
MONOS PCT: 7 %
NEUTROS ABS: 1.9 10*3/uL (ref 1.7–7.7)
Neutrophils Relative %: 32 %
PLATELETS: 193 10*3/uL (ref 150–400)
RBC: 3.39 MIL/uL — ABNORMAL LOW (ref 4.22–5.81)
RDW: 17.6 % — AB (ref 11.5–15.5)
WBC: 5.8 10*3/uL (ref 4.0–10.5)

## 2016-09-18 LAB — PROTIME-INR
INR: 1.05
Prothrombin Time: 13.8 seconds (ref 11.4–15.2)

## 2016-09-18 LAB — CHROMOSOME ANALYSIS, BONE MARROW

## 2016-09-18 LAB — APTT: aPTT: 35 seconds (ref 24–36)

## 2016-09-18 MED ORDER — SODIUM CHLORIDE 0.9 % IV SOLN
INTRAVENOUS | Status: DC
  Administered 2016-09-18: 09:00:00 via INTRAVENOUS

## 2016-09-18 MED ORDER — MIDAZOLAM HCL 2 MG/2ML IJ SOLN
INTRAMUSCULAR | Status: AC
  Filled 2016-09-18: qty 6

## 2016-09-18 MED ORDER — MIDAZOLAM HCL 2 MG/2ML IJ SOLN
INTRAMUSCULAR | Status: AC | PRN
  Administered 2016-09-18 (×2): 1 mg via INTRAVENOUS

## 2016-09-18 MED ORDER — FENTANYL CITRATE (PF) 100 MCG/2ML IJ SOLN
INTRAMUSCULAR | Status: AC | PRN
  Administered 2016-09-18 (×2): 50 ug via INTRAVENOUS

## 2016-09-18 MED ORDER — FENTANYL CITRATE (PF) 100 MCG/2ML IJ SOLN
INTRAMUSCULAR | Status: AC
  Filled 2016-09-18: qty 4

## 2016-09-18 NOTE — H&P (Signed)
Chief Complaint: Patient was seen in consultation today for bone marrow biopsy at the request of [name]  Referring Physician(s): Ladell Pier  Supervising Physician: Marybelle Killings  Patient Status: [name]  History of Present Illness: [name] is a 76 y.o. male being worked up for unexplained anemia. He is referred for bone marrow biopsy. PMHx, meds, labs, imaging, allergies reviewed. Has been NPO this am. Wife at bedside.  Past Medical History:  Diagnosis Date  . Alcohol abuse   . Aneurysm ([place])   . CKD (chronic kidney disease), stage III   . COPD (chronic obstructive pulmonary disease) ([place])   . Fever of unknown origin 08/2016  . Hyperchloremia   . Hypercholesteremia   . Hyperpotassemia   . Hypertension   . Hypertensive renal disease, benign   . Seizures ([place])    has not had a seizure in 15 yrs  . Tobacco use     Past Surgical History:  Procedure Laterality Date  . ABDOMINAL AORTOGRAM W/LOWER EXTREMITY N/A 08/13/2016   Procedure: Abdominal Aortogram w/Lower Extremity;  Surgeon: Jeremy Hampshire, MD;  Location: [facility];  Service: Cardiovascular;  Laterality: N/A;  . 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, 1996  . EYE SURGERY     December 2017  . PERIPHERAL VASCULAR BALLOON ANGIOPLASTY  08/13/2016   Procedure: Peripheral Vascular Balloon Angioplasty;  Surgeon: Jeremy Hampshire, MD;  Location: [facility];  Service: Cardiovascular;;  Aborted    Allergies: Diltiazem and Penicillins  Medications: Prior to Admission medications   Medication Sig Start Date End Date Taking? Authorizing Provider  aspirin EC 81 MG tablet Take 1 tablet (81 mg total) by mouth daily. 08/05/16   Jeremy Hampshire, MD  atorvastatin (LIPITOR) 10 MG tablet TAKE 1 TABLET BY MOUTH EVERY DAY 08/11/16   [name], NP  carvedilol (COREG) 6.25 MG tablet TAKE 1 TABLET (6.25 MG TOTAL) BY MOUTH 2 (TWO) TIMES DAILY WITH A MEAL. 09/02/16   [name], NP    cefpodoxime (VANTIN) 200 MG tablet Take 1 tablet (200 mg total) by mouth every 12 (twelve) hours. Discontinue after 08/24/16 doses. 08/23/16   Hongalgi, Jeremy Dickinson, MD  [facility] ARTIFICIAL TEARS 1-0.3 % SOLN PLACE 1 DROP IN EACHEYE EVERY 6 HOURS AS NEEDED FOR DRY EYES 06/06/16   [provider]  [name]-1 100 MG tablet TAKE 1 TABLET BY MOUTH EVERY DAY 08/06/16   [name], NP  divalproex (DEPAKOTE) 250 MG DR tablet TAKE 1 TABLET BY MOUTH 3 TIMES A DAY FOR SEIZURES 09/08/16   [name], NP  furosemide (LASIX) 20 MG tablet Take 1 tablet (20 mg total) by mouth every other day. Patient taking differently: Take 40 mg by mouth 2 (two) times daily.  08/23/16   Hongalgi, Jeremy Dickinson, MD  guaiFENesin (ROBITUSSIN) 100 MG/5ML liquid Take 10 mg by mouth 3 (three) times daily as needed for cough.    [provider]  levETIRAcetam (KEPPRA) 500 MG tablet Take one tablet by mouth twice daily 07/03/16   [name], NP  Multiple Vitamins-Minerals (THEREMS-M) TABS Take 1 tablet by mouth daily. 07/03/16   [name], NP     Family History  Problem Relation Age of Onset  . Cancer Mother   . Congestive Heart Failure Father   . Alzheimer's disease Sister   . Congestive Heart Failure Sister   . Clotting disorder Sister   . Congestive Heart Failure Brother   .  Clotting disorder Sister     Social History   Social History  . Marital status: Single    Spouse name: N/A  . Number of children: N/A  . Years of education: N/A   Social History Main Topics  . Smoking status: Former Smoker    Years: 56.00  . Smokeless tobacco: Never Used     Comment: On average about 6 cig daily   . Alcohol use 0.0 oz/week     Comment: QUIT DRINKING  04/2016  . Drug use: No  . Sexual activity: Not Currently   Other Topics Concern  . None   Social History Narrative  . None    Review of Systems: A 12 point ROS discussed and pertinent positives are indicated in the HPI above.  All other systems  are negative.  Review of Systems  Vital Signs: BP (!) 143/62 (BP Location: Right Arm)   Pulse (!) 45   Temp 97.9 F (36.6 C) (Oral)   Resp 18   SpO2 100%   Physical Exam  Constitutional: He is oriented to person, place, and time. He appears well-developed and well-nourished. No distress.  HENT:  Head: Normocephalic.  Mouth/Throat: Oropharynx is clear and moist.  Neck: Normal range of motion. No JVD present. No tracheal deviation present.  Cardiovascular: Normal rate, regular rhythm and normal heart sounds.   Pulmonary/Chest: Effort normal and breath sounds normal. No respiratory distress.  Abdominal: Soft. He exhibits no distension. There is no tenderness.  Neurological: He is alert and oriented to person, place, and time.  Skin: Skin is warm and dry.  Psychiatric: He has a normal mood and affect. Judgment normal.    Mallampati Score:  MD Evaluation Airway: WNL Heart: WNL Abdomen: WNL Chest/ Lungs: WNL ASA  Classification: 3 Mallampati/Airway Score: One  Imaging: Ct Abdomen Pelvis Wo Contrast  Result Date: 08/20/2016 CLINICAL DATA:  Drop in hemoglobin/hematocrit. Recent aortogram and peripheral vascular angioplasty. EXAM: CT ABDOMEN AND PELVIS WITHOUT CONTRAST TECHNIQUE: Multidetector CT imaging of the abdomen and pelvis was performed following the standard protocol without IV contrast. COMPARISON:  None. FINDINGS: Lower chest: There is a small left pleural effusion hand left lower lobe infiltrate and atelectasis. The right lung base is clear. No pericardial effusion. Aortic calcifications are noted. Hepatobiliary: No focal hepatic lesions or intrahepatic biliary dilatation. Pancreas: No mass, inflammation or ductal dilatation. Spleen: Normal size.  No focal lesions. Adrenals/Urinary Tract: The adrenal glands plan kidneys are grossly normal. No renal, ureteral or bladder calculi or mass. Stomach/Bowel: The stomach, duodenum, small bowel and colon are grossly normal. No obvious  inflammatory changes, mass lesions or obstructive findings. Vascular/Lymphatic: Severe atherosclerotic calcifications involving the aorta and branch vessels. There is tortuosity and ectasia of the no focal aneurysm. Similar findings involving the iliac arteries and branch vessels in the pelvis. An IVC filter is noted. No mesenteric or retroperitoneal mass or adenopathy. No hematoma is identified. Reproductive: The prostate gland and seminal vesicles are unremarkable. Other: No inguinal mass or adenopathy. Small left inguinal hernia containing fat. There is also a right sided flank hernia containing retroperitoneal/perirenal fat. Subcutaneous fluid/ edema in the gluteal regions bilaterally, left greater than right but no discrete hematoma. Musculoskeletal: No significant bony findings. IMPRESSION: No abdominal/ pelvic or groin hematoma to account for the patient's drop in hemoglobin. Extensive atherosclerotic calcifications involving the aorta and branch vessels. Left lower lobe process likely a combination of effusion, atelectasis and infiltrate. Electronically Signed   By: Marijo Sanes M.D.   On:  08/20/2016 12:27   Ct Head Wo Contrast  Result Date: 08/19/2016 CLINICAL DATA:  Acute onset confusion and weakness EXAM: CT HEAD WITHOUT CONTRAST TECHNIQUE: Contiguous axial images were obtained from the base of the skull through the vertex without intravenous contrast. COMPARISON:  Head CT 04/14/2016 FINDINGS: Brain: No mass lesion, intraparenchymal hemorrhage or extra-axial collection. No evidence of acute cortical infarct. There is unchanged encephalomalacia at the right frontal pole. There is periventricular hypoattenuation compatible with chronic microvascular disease. Atrophy with ex vacuo dilatation of the ventricles is unchanged. Vascular: There is aneurysm clip at the left carotid terminus, unchanged. No hyperdense vessel. Calcification of the vertebral arteries at the skullbase. Skull: Status post right  frontal craniectomy and left frontoparietal craniotomy. Sinuses/Orbits: Moderate opacification of the left ethmoid sinus. Normal orbits. IMPRESSION: 1. No acute abnormality. 2. Unchanged ventriculomegaly, generalized atrophy, right frontal encephalomalacia and findings of chronic microvascular ischemia 3. 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 Electronically Signed   By: Ulyses Jarred M.D.   On: 08/19/2016 22:10   Ct Chest Wo Contrast  Result Date: 08/23/2016 CLINICAL DATA:  Anemia. Former smoker. Left lower lobe process seen on recent abdominal CT. EXAM: CT CHEST WITHOUT CONTRAST TECHNIQUE: Multidetector CT imaging of the chest was performed following the standard protocol without IV contrast. COMPARISON:  Chest radiographs 08/19/2015.  Abdominal CT 08/20/2016 FINDINGS: Cardiovascular: Advanced aortic atherosclerosis with tortuosity. Multi chamber cardiomegaly. Mitral annulus and coronary artery calcifications. Small pericardial effusion. Mediastinum/Nodes: Prominent lower paratracheal node measures 10 mm short axis. There is an 8 mm high mediastinal node. Limited assessment for hilar adenopathy given lack of IV contrast. High-density in the distal esophagus is likely ingested material. Small bilateral axillary lymph nodes. Lungs/Pleura: Volume loss in the left lower lobe. There is abnormal left perihilar soft tissue density, ill-defined causing narrowing of the segmental left lower lobe bronchi. Complete occlusion of the posterior basal segmental bronchus. There is filling of the lateral basal lower lobe segmental and subsegmental branches. Moderate consolidation in the posteromedial left lower lobe. Small left pleural effusion. Minimal patchy opacities in the lingula. Minimal emphysema. There is an irregular ground-glass opacity in the basal most right lower lobe measuring 6 mm. Likely adjacent atelectasis. No right pleural effusion. No evidence of pulmonary edema. Upper Abdomen: No adrenal  nodule.  No discrete abnormality. Musculoskeletal: Mild body wall edema. Prominent Schmorl's node within superior endplate of K81 vertebral body with minimal loss of height. No blastic or destructive lytic lesions. IMPRESSION: 1. Ill-defined abnormal left perihilar soft tissue density causing bronchial narrowing and exclusion of the posterior basal left lower lobe segmental bronchus. Associated consolidation in the left lower lobe may be postobstructive atelectasis. There is bronchial filling of the lateral basal segment in the left lower lobe. Findings are suspicious for occult perihilar malignancy. Aspiration could produce similar bronchial filling. Bronchoscopy may be helpful. Alternatively, PET-CT could be considered. Small left pleural effusion. 2. Nonspecific irregular 6 mm ground-glass nodular opacity in the basal most right lower lobe, possibly simply atelectasis. 3. Prominent lower paratracheal node measuring 10 mm, this is nonspecific and may be reactive, neoplastic adenopathy is considered pending left lower lobe process. 4. Advanced aortic atherosclerosis.  Coronary artery calcifications. 5. Focal hyperdensity in the distal esophagus is likely ingested material, such as a pill. Electronically Signed   By: Jeb Levering M.D.   On: 08/23/2016 00:09    Labs:  CBC:  Recent Labs  08/21/16 0417 08/21/16 1813 08/22/16 0650 08/23/16 0409  WBC 13.8*  11.1* 11.0* 11.3*  HGB 7.0* 8.4* 7.6* 7.7*  HCT 21.9* 25.1* 24.0* 24.3*  PLT 236 245 253 269    COAGS:  Recent Labs  04/14/16 1612 08/07/16 1200  INR 0.91 0.9    BMP:  Recent Labs  08/07/16 1200 08/18/16 1348 08/19/16 0454 08/20/16 0417  NA 138 138 140 140  K 4.7 5.2* 4.6 4.2  CL 104 104 110 112*  CO2 32* 22 22 21*  GLUCOSE 93 143* 102* 101*  BUN 25 41* 34* 27*  CALCIUM 9.8 8.4* 8.2* 8.1*  CREATININE 1.40* 1.83* 1.29* 1.13  GFRNONAA 48* 34* 52* >60  GFRAA 56* 39* >60 >60    LIVER FUNCTION TESTS:  Recent Labs   04/25/16 2009 06/19/16 1051 08/18/16 1348 08/21/16 1144  BILITOT 0.8 0.3 0.5 0.5  AST 33 '13 29 25  ' ALT 30 6* 22 20  ALKPHOS 81 64 55 56  PROT 5.5* 6.7 6.8 6.6  ALBUMIN 2.1* 3.3* 2.9* 2.3*    TUMOR MARKERS: No results for input(s): AFPTM, CEA, CA199, CHROMGRNA in the last 8760 hours.  Assessment and Plan: Chronic anemia For CT guided bone marrow biopsy Labs pending Risks and Benefits discussed with the patient including, but not limited to bleeding, infection, damage to adjacent structures or low yield requiring additional tests. All of the patient's questions were answered, patient is agreeable to proceed. Consent signed and in chart.    Thank you for this interesting consult.  I greatly enjoyed meeting BENOIT MEECH and look forward to participating in their care.  A copy of this report was sent to the requesting provider on this date.  Electronically Signed: Ascencion Dike, [name] 09/18/2016, 9:19 AM   I spent a total of 20 Minutes  in face to face in clinical consultation, greater than 50% of which was counseling/coordinating care for bone marrow biopsy

## 2016-09-18 NOTE — Sedation Documentation (Signed)
This nurse spoke to pt's sister in the waiting room, she verbalized that pt can be confused at times.  She reports pt's cousin Currie Paris is the pt's POA.  Spoke to Ms Lauro Regulus and also verbalized that pt can be confused at times.  This nurse and Hoss MD obtained verbal consent from Ms Lauro Regulus to proceed with the procedure.  Ms Lauro Regulus verbalized understanding of the possible risks of the procedure.

## 2016-09-18 NOTE — Discharge Instructions (Signed)
Moderate Conscious Sedation, Adult, Care After °These instructions provide you with information about caring for yourself after your procedure. Your health care provider may also give you more specific instructions. Your treatment has been planned according to current medical practices, but problems sometimes occur. Call your health care provider if you have any problems or questions after your procedure. °What can I expect after the procedure? °After your procedure, it is common: °· To feel sleepy for several hours. °· To feel clumsy and have poor balance for several hours. °· To have poor judgment for several hours. °· To vomit if you eat too soon. ° °Follow these instructions at home: °For at least 24 hours after the procedure: ° °· Do not: °? Participate in activities where you could fall or become injured. °? Drive. °? Use heavy machinery. °? Drink alcohol. °? Take sleeping pills or medicines that cause drowsiness. °? Make important decisions or sign legal documents. °? Take care of children on your own. °· Rest. °Eating and drinking °· Follow the diet recommended by your health care provider. °· If you vomit: °? Drink water, juice, or soup when you can drink without vomiting. °? Make sure you have little or no nausea before eating solid foods. °General instructions °· Have a responsible adult stay with you until you are awake and alert. °· Take over-the-counter and prescription medicines only as told by your health care provider. °· If you smoke, do not smoke without supervision. °· Keep all follow-up visits as told by your health care provider. This is important. °Contact a health care provider if: °· You keep feeling nauseous or you keep vomiting. °· You feel light-headed. °· You develop a rash. °· You have a fever. °Get help right away if: °· You have trouble breathing. °This information is not intended to replace advice given to you by your health care provider. Make sure you discuss any questions you have  with your health care provider. °Document Released: 01/12/2013 Document Revised: 08/27/2015 Document Reviewed: 07/14/2015 °Elsevier Interactive Patient Education © 2018 [employer] °Bone Marrow Aspiration and Bone Marrow Biopsy, Adult, Care After °This sheet gives you information about how to care for yourself after your procedure. Your health care provider may also give you more specific instructions. If you have problems or questions, contact your health care provider. °What can I expect after the procedure? °After the procedure, it is common to have: °· Mild pain and tenderness. °· Swelling. °· Bruising. ° °Follow these instructions at home: °· Take over-the-counter or prescription medicines only as told by your health care provider. °· Do not take baths, swim, or use a hot tub until your health care provider approves. Ask if you can take a shower or have a sponge bath. °· Follow instructions from your health care provider about how to take care of the puncture site. Make sure you: °? Wash your hands with soap and water before you change your bandage (dressing). If soap and water are not available, use hand sanitizer. °? Change your dressing as told by your health care provider. °· Check your puncture site every day for signs of infection. Check for: °? More redness, swelling, or pain. °? More fluid or blood. °? Warmth. °? Pus or a bad smell. °· Return to your normal activities as told by your health care provider. Ask your health care provider what activities are safe for you. °· Do not drive for 24 hours if you were given a medicine to help you relax (sedative). °·   Keep all follow-up visits as told by your health care provider. This is important. °Contact a health care provider if: °· You have more redness, swelling, or pain around the puncture site. °· You have more fluid or blood coming from the puncture site. °· Your puncture site feels warm to the touch. °· You have pus or a bad smell coming from the  puncture site. °· You have a fever. °· Your pain is not controlled with medicine. °This information is not intended to replace advice given to you by your health care provider. Make sure you discuss any questions you have with your health care provider. °Document Released: 10/11/2004 Document Revised: 10/12/2015 Document Reviewed: 09/05/2015 °Elsevier Interactive Patient Education © 2018 [employer] ° °

## 2016-09-18 NOTE — Sedation Documentation (Addendum)
When assisting pt on the CT table, pt verbalized he is having his L leg operated on.  When notified that he is not having an operation today, he is having a bone marrow biopsy.  Pt became agitated and mad and started to curse at staff, but he eventually calmed down and preceded to get on the CT table.  Hoss MD aware that pt is not cognitively competent and he signed his consent.

## 2016-09-18 NOTE — Procedures (Signed)
R iliac BM aspirate and core EBL 0 Comp 0

## 2016-09-22 ENCOUNTER — Other Ambulatory Visit: Payer: Self-pay | Admitting: *Deleted

## 2016-09-22 DIAGNOSIS — D649 Anemia, unspecified: Secondary | ICD-10-CM

## 2016-09-23 ENCOUNTER — Telehealth: Payer: Self-pay | Admitting: *Deleted

## 2016-09-23 ENCOUNTER — Ambulatory Visit (HOSPITAL_BASED_OUTPATIENT_CLINIC_OR_DEPARTMENT_OTHER): Payer: Medicare Other | Admitting: Oncology

## 2016-09-23 ENCOUNTER — Telehealth: Payer: Self-pay | Admitting: Oncology

## 2016-09-23 ENCOUNTER — Other Ambulatory Visit (HOSPITAL_BASED_OUTPATIENT_CLINIC_OR_DEPARTMENT_OTHER): Payer: Medicare Other

## 2016-09-23 VITALS — BP 127/52 | HR 49 | Temp 97.7°F | Resp 17 | Ht 66.0 in | Wt 124.4 lb

## 2016-09-23 DIAGNOSIS — I739 Peripheral vascular disease, unspecified: Secondary | ICD-10-CM

## 2016-09-23 DIAGNOSIS — N189 Chronic kidney disease, unspecified: Secondary | ICD-10-CM | POA: Diagnosis not present

## 2016-09-23 DIAGNOSIS — J449 Chronic obstructive pulmonary disease, unspecified: Secondary | ICD-10-CM | POA: Diagnosis not present

## 2016-09-23 DIAGNOSIS — D649 Anemia, unspecified: Secondary | ICD-10-CM

## 2016-09-23 DIAGNOSIS — G40909 Epilepsy, unspecified, not intractable, without status epilepticus: Secondary | ICD-10-CM

## 2016-09-23 LAB — CBC WITH DIFFERENTIAL/PLATELET
BASO%: 0.7 % (ref 0.0–2.0)
Basophils Absolute: 0 10*3/uL (ref 0.0–0.1)
EOS%: 9.3 % — AB (ref 0.0–7.0)
Eosinophils Absolute: 0.5 10*3/uL (ref 0.0–0.5)
HCT: 29.7 % — ABNORMAL LOW (ref 38.4–49.9)
HGB: 9.6 g/dL — ABNORMAL LOW (ref 13.0–17.1)
LYMPH#: 2.1 10*3/uL (ref 0.9–3.3)
LYMPH%: 42.1 % (ref 14.0–49.0)
MCH: 27.5 pg (ref 27.2–33.4)
MCHC: 32.4 g/dL (ref 32.0–36.0)
MCV: 84.8 fL (ref 79.3–98.0)
MONO#: 0.4 10*3/uL (ref 0.1–0.9)
MONO%: 7.6 % (ref 0.0–14.0)
NEUT#: 2 10*3/uL (ref 1.5–6.5)
NEUT%: 40.3 % (ref 39.0–75.0)
Platelets: 180 10*3/uL (ref 140–400)
RBC: 3.5 10*6/uL — AB (ref 4.20–5.82)
RDW: 18.4 % — ABNORMAL HIGH (ref 11.0–14.6)
WBC: 4.9 10*3/uL (ref 4.0–10.3)

## 2016-09-23 NOTE — Telephone Encounter (Signed)
[name], caregiver called and stated that patient has an appointment on Thursday and she wants him tested for Dementia. She stated that yesterday patient took his Medication twice and she had to call [facility]. She stated that she wants a memory test done and his memory address at his appointment on Thursday.

## 2016-09-23 NOTE — Telephone Encounter (Signed)
Caregiver notified to bring patient 15-20 minutes earlier for the MMSE

## 2016-09-23 NOTE — Telephone Encounter (Signed)
Scheduled appt per 6/19 los - Gave patient AVS and calender per LOS .  

## 2016-09-23 NOTE — Telephone Encounter (Signed)
Pt with known memory impairment. See if they want to come at 9 to have a longer appt spot. It sounds like we will need the time. If not they need to get here at least 15 mins prior to appt time. (please put MMSE on appt notes as well) thanks

## 2016-09-23 NOTE — Progress Notes (Signed)
[place] OFFICE PROGRESS NOTE   Diagnosis: Anemia  INTERVAL HISTORY:   Mr. [name] returns as scheduled. I saw him when he was hospitalized last month. He has multiple medical issues including peripheral vascular disease, a healing left foot ulcer, seizure disorder, and anemia. He is followed at the wound clinic for the foot ulcer. He denies bleeding. He was transfused with a unit of packed red blood cells on 08/21/2016.  Mr. Loveall is accompanied by his cousin today. He has no specific complaint. Objective:  Vital signs in last 24 hours:  Blood pressure (!) 127/52, pulse (!) 49, temperature 97.7 F (36.5 C), temperature source Oral, resp. rate 17, height '5\' 6"'  (1.676 m), weight 124 lb 6.4 oz (56.4 kg), SpO2 100 %.   Resp: Lungs clear bilaterally Cardio: Regular rate and rhythm GI: No hepatosplenomegaly, nontender Vascular: Chronic stasis change at the low leg bilaterally, trace left greater than right low leg edema. Gauze wrapping over the left foot Neuro: Alert, follows commands, difficulty with memory recall, answers questions inappropriately      Lab Results:  Lab Results  Component Value Date   WBC 4.9 09/23/2016   HGB 9.6 (L) 09/23/2016   HCT 29.7 (L) 09/23/2016   MCV 84.8 09/23/2016   PLT 180 09/23/2016   NEUTROABS 2.0 09/23/2016    CMP     Component Value Date/Time   NA 140 08/20/2016 0417   NA 138 08/07/2016 1200   K 4.2 08/20/2016 0417   CL 112 (H) 08/20/2016 0417   CO2 21 (L) 08/20/2016 0417   GLUCOSE 101 (H) 08/20/2016 0417   BUN 27 (H) 08/20/2016 0417   BUN 25 08/07/2016 1200   CREATININE 1.13 08/20/2016 0417   CREATININE 1.10 06/19/2016 1051   CALCIUM 8.1 (L) 08/20/2016 0417   PROT 6.6 08/21/2016 1144   PROT 6.9 04/11/2015 0937   ALBUMIN 2.3 (L) 08/21/2016 1144   ALBUMIN 4.2 04/11/2015 0937   AST 25 08/21/2016 1144   ALT 20 08/21/2016 1144   ALKPHOS 56 08/21/2016 1144   BILITOT 0.5 08/21/2016 1144   BILITOT 0.3 04/11/2015 0937   GFRNONAA >60 08/20/2016 0417   GFRNONAA 64 06/19/2016 1051   GFRAA >60 08/20/2016 0417   GFRAA 74 06/19/2016 1051   Erythropoietin on 08/22/2016-22.3, serum protein electrophoresis 08/22/2016-polyclonal IgA kappa and lambda protein Medications: I have reviewed the patient's current medications.  Assessment/Plan:  1. Anemia-status post transfusion with 1 unit of packed red blood cells 08/21/2016  Bone marrow biopsy 09/18/2016-mildly hypercellular marrow with dyspoeisis of the megakaryocytes, hypogranular granulocytic precursors 2. History of alcohol and tobacco abuse 3. Peripheral vascular disease 4. Left heel ulcer 5. Seizure disorder 6. COPD 7. Chronic renal failure 8. History of a cerebral aneurysm    Disposition:  Mr. Borowiak has normocytic anemia. The hemoglobin is higher following the red cell transfusion last month. 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 I suspect the anemia is related to "chronic disease ", alcohol  use, or renal failure.  He does not appear symptomatic from the anemia at present. He will return for a CBC in one month and an office visit in 2 months. His cousin will contact us as needed in the interim. He continues follow-up at the wound clinic for the left foot ulcer.  We will consider a trial of erythropoietin if the hemoglobin falls. The erythropoietin level was inappropriately low while he was in the hospital last month.  We will follow-up on the  bone marrow cytogenetics and FISH panel.  25 minutes were spent with the patient today. The majority of the time was used for counseling and coordination of care.  Donneta Romberg, MD  09/23/2016  1:55 PM

## 2016-09-25 ENCOUNTER — Ambulatory Visit (INDEPENDENT_AMBULATORY_CARE_PROVIDER_SITE_OTHER): Payer: Medicare Other | Admitting: Nurse Practitioner

## 2016-09-25 ENCOUNTER — Encounter: Payer: Self-pay | Admitting: Nurse Practitioner

## 2016-09-25 VITALS — BP 132/74 | HR 60 | Temp 97.8°F | Resp 17 | Ht 66.0 in | Wt 124.8 lb

## 2016-09-25 DIAGNOSIS — D631 Anemia in chronic kidney disease: Secondary | ICD-10-CM

## 2016-09-25 DIAGNOSIS — N189 Chronic kidney disease, unspecified: Secondary | ICD-10-CM | POA: Diagnosis not present

## 2016-09-25 DIAGNOSIS — R531 Weakness: Secondary | ICD-10-CM | POA: Diagnosis not present

## 2016-09-25 DIAGNOSIS — I129 Hypertensive chronic kidney disease with stage 1 through stage 4 chronic kidney disease, or unspecified chronic kidney disease: Secondary | ICD-10-CM | POA: Diagnosis not present

## 2016-09-25 DIAGNOSIS — F0391 Unspecified dementia with behavioral disturbance: Secondary | ICD-10-CM | POA: Diagnosis not present

## 2016-09-25 DIAGNOSIS — L89620 Pressure ulcer of left heel, unstageable: Secondary | ICD-10-CM | POA: Diagnosis not present

## 2016-09-25 MED ORDER — DONEPEZIL HCL 5 MG PO TABS: 5.0000 mg | ORAL_TABLET | Freq: Every day | ORAL | 1 refills | Status: DC

## 2016-09-25 MED ORDER — ZOSTER VAC RECOMB ADJUVANTED 50 MCG/0.5ML IM SUSR: 0.5000 mL | Freq: Once | INTRAMUSCULAR | 1 refills | Status: AC

## 2016-09-25 NOTE — Progress Notes (Signed)
Careteam: Patient Care Team: [name], NP as PCP - General (Nurse Practitioner)  Advanced Directive information Does Patient Have a Medical Advance Directive?: Yes, Type of Advance Directive: Healthcare Power of Attorney  Allergies  Allergen Reactions  . Diltiazem Rash and Other (See Comments)    Blisters  . Penicillins Other (See Comments)    Unknown Childhood allergy  Has patient had a PCN reaction causing immediate rash, facial/tongue/throat swelling, SOB or lightheadedness with hypotension: unknown Has patient had a PCN reaction causing severe rash involving mucus membranes or skin necrosis: unknown Has patient had a PCN reaction that required hospitalization unknown Has patient had a PCN reaction occurring within the last 10 years:no If all of the above answers are "NO", then may proceed with Cephalosporin use.     Chief Complaint  Patient presents with  . Follow-up    Pt was recently discharged from [facility] on 09/12/16. Failed clock drawing.  . Other    Cousin (POA) in room.      HPI: Patient is a 76 y.o. male seen in the office today to follow up rehab stay. Pt was hospitalized due to severe weakness with fever. He is being worked up from multiple myeloma by Dr Benay Spice. Bone Marrow biopsy has been done. Follows up in another month for blood work. Possibly will get a trial of erythropoietin due to anemia.  Abnormal CT during hospitalization. Suspected that it was secondary to pneumonia and mucus in the airways. Completed one-week course of antibiotics and recommended follow up CT which has been scheduled for July.  conts to smoke but has quit drinking Left heel wound- improving but has not healed. Going to the wound care center. Now has another dark place on bottom of his foot which appeared while he was at [facility].  Nurse has been watching it and wants to wait on wound care (appt tomorrow).  Following with cardiology for PAD.  Cousin (who is caregiver)  most concerned with memory. Reports he took his pills twice  She reports memory was slowly declining and then after hospitalization there was a dramatic decline. Has people who come help him with bathing and other activities throughout the day.  Does not remember things from one day to the next.  Does not know where he has placed his wallet  Review of Systems:  Review of Systems  Unable to perform ROS: Dementia    Past Medical History:  Diagnosis Date  . Alcohol abuse   . Anemia   . Aneurysm ([place])   . CKD (chronic kidney disease), stage III   . COPD (chronic obstructive pulmonary disease) ([place])   . Dysrhythmia   . Fever of unknown origin 08/2016  . History of atrial fibrillation   . Hyperchloremia   . Hypercholesteremia   . Hyperpotassemia   . Hypertension   . Hypertensive renal disease, benign   . Leg edema   . PAD (peripheral artery disease) ([place])   . Pneumonia   . Pressure ulcer of left heel   . Seizures ([place])    has not had a seizure in 15 yrs  . Tobacco use   . Weight loss    Past Surgical History:  Procedure Laterality Date  . ABDOMINAL AORTOGRAM W/LOWER EXTREMITY N/A 08/13/2016   Procedure: Abdominal Aortogram w/Lower Extremity;  Surgeon: Wellington Hampshire, MD;  Location: [place] [facility];  Service: Cardiovascular;  Laterality: N/A;  . 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, 1996  . EYE SURGERY  December 2017  . PERIPHERAL VASCULAR BALLOON ANGIOPLASTY  08/13/2016   Procedure: Peripheral Vascular Balloon Angioplasty;  Surgeon: Wellington Hampshire, MD;  Location: [place] [facility];  Service: Cardiovascular;;  Aborted   Social History:   reports that he has been smoking Cigarettes.  He has a 14.00 pack-year smoking history. He has never used smokeless tobacco. He reports that he does not drink alcohol or use drugs.  Family History  Problem Relation Age of Onset  . Cancer Mother   . Congestive Heart Failure Father   . Alzheimer's disease Sister   . Congestive Heart  Failure Sister   . Clotting disorder Sister   . Congestive Heart Failure Brother   . Clotting disorder Sister     Medications: Patient's Medications  New Prescriptions   No medications on file  Previous Medications   ASPIRIN EC 81 MG TABLET    Take 1 tablet (81 mg total) by mouth daily.   ATORVASTATIN (LIPITOR) 10 MG TABLET    TAKE 1 TABLET BY MOUTH EVERY DAY   CARVEDILOL (COREG) 6.25 MG TABLET    TAKE 1 TABLET (6.25 MG TOTAL) BY MOUTH 2 (TWO) TIMES DAILY WITH A MEAL.   [facility] B-1 100 MG TABLET    TAKE 1 TABLET BY MOUTH EVERY DAY   DIVALPROEX (DEPAKOTE) 250 MG DR TABLET    TAKE 1 TABLET BY MOUTH 3 TIMES A DAY FOR SEIZURES   FUROSEMIDE (LASIX) 40 MG TABLET    Take 40 mg by mouth 2 (two) times daily.   GUAIFENESIN (ROBITUSSIN) 100 MG/5ML LIQUID    Take 10 mg by mouth 3 (three) times daily as needed for cough.   LEVETIRACETAM (KEPPRA) 500 MG TABLET    Take one tablet by mouth twice daily   MULTIPLE VITAMINS-MINERALS (THEREMS-M) TABS    Take 1 tablet by mouth daily.  Modified Medications   Modified Medication Previous Medication   ZOSTER VAC RECOMB ADJUVANTED (SHINGRIX) INJECTION Zoster Vac Recomb Adjuvanted (SHINGRIX) injection      Inject 0.5 mLs into the muscle once.    Inject 0.5 mLs into the muscle once.  Discontinued Medications   [facility] ARTIFICIAL TEARS 1-0.3 % SOLN    PLACE 1 DROP IN EACHEYE EVERY 6 HOURS AS NEEDED FOR DRY EYES   FUROSEMIDE (LASIX) 20 MG TABLET    Take 1 tablet (20 mg total) by mouth every other day.     Physical Exam:  Vitals:   09/25/16 0951  BP: 132/74  Pulse: 60  Resp: 17  Temp: 97.8 F (36.6 C)  TempSrc: Oral  SpO2: 97%  Weight: 124 lb 12.8 oz (56.6 kg)  Height: _0  (1.676 m)   Body mass index is 20.14 kg/m.  Physical Exam  Constitutional: No distress.  Frail thin male NAD.   HENT:  Mouth/Throat: Oropharynx is clear and moist. No oropharyngeal exudate.  Cardiovascular: Normal rate, regular rhythm and normal heart sounds.   Pulmonary/Chest:  Effort normal and breath sounds normal. No respiratory distress.  Abdominal: Soft. Bowel sounds are normal.  Musculoskeletal: Normal range of motion. He exhibits edema (2+ to left leg, 1+ to right leg).  Neurological: He is alert.  Skin: Skin is warm. No rash noted.  Pressure ulcer to right heel, dressing CDI Black eschar in irregular oval to plantar surface below 1st and 2nd metatarsal joint   Psychiatric: His affect is labile. Cognition and memory are impaired. He exhibits abnormal recent memory.    Labs reviewed: Basic Metabolic Panel:  Recent  Labs  04/14/16 1612  08/18/16 1348 08/19/16 0454 08/19/16 1623 08/20/16 0417  NA 140  < > 138 140  --  140  K 3.8  < > 5.2* 4.6  --  4.2  CL 103  < > 104 110  --  112*  CO2 26  < > 22 22  --  21*  GLUCOSE 156*  < > 143* 102*  --  101*  BUN 15  < > 41* 34*  --  27*  CREATININE 1.41*  < > 1.83* 1.29*  --  1.13  CALCIUM 9.3  < > 8.4* 8.2*  --  8.1*  MG 1.9  --   --   --   --   --   TSH 1.992  --   --   --  3.541  --   < > = values in this interval not displayed. Liver Function Tests:  Recent Labs  06/19/16 1051 08/18/16 1348 08/21/16 1144  AST _0 ALT 6* 22 20  ALKPHOS 64 55 56  BILITOT 0.3 0.5 0.5  PROT 6.7 6.8 6.6  ALBUMIN 3.3* 2.9* 2.3*   No results for input(s): LIPASE, AMYLASE in the last 8760 hours. No results for input(s): AMMONIA in the last 8760 hours. CBC:  Recent Labs  08/18/16 1348  08/23/16 0409 09/18/16 0920 09/23/16 1200  WBC 13.1*  < > 11.3* 5.8 4.9  NEUTROABS 10.1*  --   --  1.9 2.0  HGB 7.9*  < > 7.7* 9.2* 9.6*  HCT 24.1*  < > 24.3* 28.7* 29.7*  MCV 87.0  < > 85.6 84.7 84.8  PLT 189  < > 269 193 180  < > = values in this interval not displayed. Lipid Panel:  Recent Labs  10/15/15 1016 04/15/16 0211  CHOL 217* 164  HDL 107 103  LDLCALC 97 51  TRIG 64 48  CHOLHDL 2.0 1.6   TSH:  Recent Labs  04/14/16 1612 08/19/16 1623  TSH 1.992 3.541   A1C: Lab Results  Component Value  Date   HGBA1C 5.2 04/15/2016     Assessment/Plan 1. Dementia with behavioral disturbance, unspecified dementia type - donepezil (ARICEPT) 5 MG tablet; Take 1 tablet (5 mg total) by mouth at bedtime.  Dispense: 30 tablet; Refill: 1 - Ambulatory referral to [facility] for caregiver support and resources  2. Weakness -following with home health after rehab stay.  - Ambulatory referral to [facility] to help find resources.   3. Hypertensive renal disease, stage 1-4 or unspecified chronic kidney disease Blood pressure stable, will cont coreg. Also conts on lasix.   4. 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  5. Decubitus ulcer of left heel, unstageable ([place]) -following with home health and wound care.  - Ambulatory referral to [facility]  Follow up in 8 weeks Shanzay Hepworth K. Harle Battiest  [facility] [phone] 8 am - 5 pm) [phone] (after hours)

## 2016-09-25 NOTE — Patient Instructions (Addendum)
Start aricept daily with food for memory  36 hour day.

## 2016-09-26 ENCOUNTER — Encounter: Payer: Medicare Other | Attending: Physician Assistant | Admitting: Physician Assistant

## 2016-09-26 DIAGNOSIS — N189 Chronic kidney disease, unspecified: Secondary | ICD-10-CM | POA: Diagnosis not present

## 2016-09-26 DIAGNOSIS — F17210 Nicotine dependence, cigarettes, uncomplicated: Secondary | ICD-10-CM | POA: Insufficient documentation

## 2016-09-26 DIAGNOSIS — J449 Chronic obstructive pulmonary disease, unspecified: Secondary | ICD-10-CM | POA: Diagnosis not present

## 2016-09-26 DIAGNOSIS — I70245 Atherosclerosis of native arteries of left leg with ulceration of other part of foot: Secondary | ICD-10-CM | POA: Diagnosis present

## 2016-09-26 DIAGNOSIS — G40909 Epilepsy, unspecified, not intractable, without status epilepticus: Secondary | ICD-10-CM | POA: Diagnosis not present

## 2016-09-26 DIAGNOSIS — L97522 Non-pressure chronic ulcer of other part of left foot with fat layer exposed: Secondary | ICD-10-CM | POA: Insufficient documentation

## 2016-09-26 DIAGNOSIS — I4891 Unspecified atrial fibrillation: Secondary | ICD-10-CM | POA: Diagnosis not present

## 2016-09-26 DIAGNOSIS — F0390 Unspecified dementia without behavioral disturbance: Secondary | ICD-10-CM | POA: Insufficient documentation

## 2016-09-26 DIAGNOSIS — L89624 Pressure ulcer of left heel, stage 4: Secondary | ICD-10-CM | POA: Diagnosis not present

## 2016-09-26 DIAGNOSIS — Z880 Allergy status to penicillin: Secondary | ICD-10-CM | POA: Insufficient documentation

## 2016-09-26 DIAGNOSIS — D631 Anemia in chronic kidney disease: Secondary | ICD-10-CM | POA: Diagnosis not present

## 2016-09-26 DIAGNOSIS — I129 Hypertensive chronic kidney disease with stage 1 through stage 4 chronic kidney disease, or unspecified chronic kidney disease: Secondary | ICD-10-CM | POA: Insufficient documentation

## 2016-09-30 NOTE — Progress Notes (Signed)
[name], [name] ([id number]) Visit Report for 09/26/2016 Allergy List Details Patient Name: [name], [name] Date of Service: 09/26/2016 10:30 AM Medical Record Number: [id number] Patient Account Number: [id number] Date of Birth/Sex: 1940/07/27 (76 y.o. Male) Treating RN: Ahmed Prima Primary Care Germani Gavilanes: [name] Other Clinician: Referring India Jolin: [name] Treating Greggory Safranek/Extender: [name], HOYT Weeks in Treatment: 0 Allergies Active Allergies diltiazem penicillin Allergy Notes Electronic Signature(s) Signed: 09/29/2016 4:29:00 PM By: Alric Quan Entered By: Alric Quan on 09/26/2016 10:50:13 [name], [name] ([id number]) -------------------------------------------------------------------------------- Arrival Information Details Patient Name: Jeremy Gang D. Date of Service: 09/26/2016 10:30 AM Medical Record Number: [id number] Patient Account Number: [id number] Date of Birth/Sex: 1940/04/10 (76 y.o. Male) Treating RN: Ahmed Prima Primary Care Kenston Longton: [name] Other Clinician: Referring Jasmain Ahlberg: [name] Treating Rushawn Capshaw/Extender: Melburn Hake, HOYT Weeks in Treatment: 0 Visit Information Patient Arrived: Walker Arrival Time: 10:45 Accompanied By: niece Transfer Assistance: EasyPivot Patient Lift Patient Identification Verified: Yes Secondary Verification Process Yes Completed: Patient Requires Transmission- No Based Precautions: Patient Has Alerts: No History Since Last Visit All ordered tests and consults were completed: No Added or deleted any medications: No Any new allergies or adverse reactions: No Had a fall or experienced change in activities of daily living that may affect risk of falls: No Signs or symptoms of abuse/neglect since last visito No Hospitalized since last visit: No Electronic Signature(s) Signed: 09/29/2016 4:29:00 PM By: Alric Quan Entered By: Alric Quan on 09/26/2016 10:46:01 Ehrlich,  Keyondre D. ([id number]) -------------------------------------------------------------------------------- Clinic Level of Care Assessment Details Patient Name: Jeremy Gang D. Date of Service: 09/26/2016 10:30 AM Medical Record Number: [id number] Patient Account Number: [id number] Date of Birth/Sex: Jan 31, 1941 (76 y.o. Male) Treating RN: Ahmed Prima Primary Care Rhyanna Sorce: [name] Other Clinician: Referring Ebelin Dillehay: [name] Treating Tiffany Calmes/Extender: Melburn Hake, HOYT Weeks in Treatment: 0 Clinic Level of Care Assessment Items TOOL 1 Quantity Score X - Use when EandM and Procedure is performed on INITIAL visit 1 0 ASSESSMENTS - Nursing Assessment / Reassessment X - General Physical Exam (combine w/ comprehensive assessment (listed just 1 20 below) when performed on new pt. evals) X - Comprehensive Assessment (HX, ROS, Risk Assessments, Wounds Hx, etc.) 1 25 ASSESSMENTS - Wound and Skin Assessment / Reassessment []  - Dermatologic / Skin Assessment (not related to wound area) 0 ASSESSMENTS - Ostomy and/or Continence Assessment and Care []  - Incontinence Assessment and Management 0 []  - Ostomy Care Assessment and Management (repouching, etc.) 0 PROCESS - Coordination of Care []  - Simple Patient / Family Education for ongoing care 0 X - Complex (extensive) Patient / Family Education for ongoing care 1 20 X - Staff obtains Programmer, systems, Records, Test Results / Process Orders 1 10 X - Staff telephones HHA, Nursing Homes / Clarify orders / etc 1 10 []  - Routine Transfer to another Facility (non-emergent condition) 0 []  - Routine Hospital Admission (non-emergent condition) 0 X - New Admissions / Biomedical engineer / Ordering NPWT, Apligraf, etc. 1 15 []  - Emergency Hospital Admission (emergent condition) 0 PROCESS - Special Needs []  - Pediatric / Minor Patient Management 0 []  - Isolation Patient Management 0 [name], [name] ([id number]) []  - Hearing / Language / Visual  special needs 0 []  - Assessment of Community assistance (transportation, D/C planning, etc.) 0 []  - Additional assistance / Altered mentation 0 []  - Support Surface(s) Assessment (bed, cushion, seat, etc.) 0 INTERVENTIONS - Miscellaneous []  - External ear exam 0 []  - Patient Transfer (multiple staff / Civil Service fast streamer / Similar devices) 0 []  -  Simple Staple / Suture removal (25 or less) 0 []  - Complex Staple / Suture removal (26 or more) 0 []  - Hypo/Hyperglycemic Management (do not check if billed separately) 0 X - Ankle / Brachial Index (ABI) - do not check if billed separately 1 15 Has the patient been seen at the hospital within the last three years: Yes Total Score: 115 Level Of Care: New/Established - Level 3 Electronic Signature(s) Signed: 09/29/2016 4:29:00 PM By: Alric Quan Entered By: Alric Quan on 09/26/2016 13:05:22 [name], [name] ([id number]) -------------------------------------------------------------------------------- Encounter Discharge Information Details Patient Name: Jeremy Gang D. Date of Service: 09/26/2016 10:30 AM Medical Record Number: [id number] Patient Account Number: [id number] Date of Birth/Sex: 11-16-1940 (76 y.o. Male) Treating RN: Ahmed Prima Primary Care [place]: [name] Other Clinician: Referring Wisdom Seybold: [name] Treating Larsen Dungan/Extender: Melburn Hake, HOYT Weeks in Treatment: 0 Encounter Discharge Information Items Discharge Pain Level: 0 Discharge Condition: Stable Ambulatory Status: Walker Discharge Destination: Home Transportation: Private Auto Accompanied By: niece Schedule Follow-up Appointment: Yes Medication Reconciliation completed No and provided to Patient/Care Manual [place]: Provided on Clinical Summary of Care: 09/26/2016 Form Type Recipient Paper Patient [facility] Electronic Signature(s) Signed: 09/29/2016 4:29:00 PM By: Alric Quan Previous Signature: 09/26/2016 11:54:44 AM Version By: Ruthine Dose Entered By: Alric Quan on 09/26/2016 12:03:36 [name], [name] ([id number]) -------------------------------------------------------------------------------- Lower Extremity Assessment Details Patient Name: Jeremy Gang D. Date of Service: 09/26/2016 10:30 AM Medical Record Number: [id number] Patient Account Number: [id number] Date of Birth/Sex: January 29, 1941 (76 y.o. Male) Treating RN: Ahmed Prima Primary Care Ryn Peine: [name] Other Clinician: Referring Kalei Mckillop: [name] Treating Ivo Moga/Extender: Melburn Hake, HOYT Weeks in Treatment: 0 Vascular Assessment Pulses: Dorsalis Pedis Palpable: [Left:No] Doppler Audible: [Left:Yes] Posterior Tibial Extremity colors, hair growth, and conditions: Extremity Color: [Left:Hyperpigmented] Temperature of Extremity: [Left:Warm] Capillary Refill: [Left:< 3 seconds] Blood Pressure: Brachial: [Left:168] Dorsalis Pedis: 98 [Left:Dorsalis Pedis:] Ankle: Posterior Tibial: 100 [Left:Posterior Tibial: 0.60] Toe Nail Assessment Left: Right: Thick: No Discolored: No Deformed: No Improper Length and Hygiene: No Electronic Signature(s) Signed: 09/29/2016 4:29:00 PM By: Alric Quan Entered By: Alric Quan on 09/26/2016 11:15:00 [name], [name] ([id number]) -------------------------------------------------------------------------------- Multi Wound Chart Details Patient Name: Jeremy Gang D. Date of Service: 09/26/2016 10:30 AM Medical Record Number: [id number] Patient Account Number: [id number] Date of Birth/Sex: [birth year] (76 y.o. 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] [3:Atypical] [N/A:N/A] Comorbid History: [2:Anemia, Chronic Obstructive Pulmonary Disease (COPD), Arrhythmia, Hypertension, Arrhythmia, Hypertension, Dementia, Seizure Disorder] [3:Anemia, Chronic Obstructive Pulmonary Disease (COPD), Dementia, Seizure Disorder] [N/A:N/A] Date Acquired: [2:05/29/2016] [3:09/12/2016] [N/A:N/A] Weeks of Treatment: [2:0] [3:0] [N/A:N/A] Wound Status: [2:Open] [3:Open] [N/A:N/A] Measurements L x W x D 1.3x1.5x0.3 [3:2x1x0.1] [N/A:N/A] (cm) Area (cm) : [2:1.532] [3:1.571] [N/A:N/A] Volume (cm) : [2:0.459] [3:0.157] [N/A:N/A] Classification: [2:Category/Stage II] [3:Partial Thickness] [N/A:N/A] Exudate Amount: [2:Large] [3:Large] [N/A:N/A] Exudate Type: [2:Serosanguineous] [3:Serosanguineous] [N/A:N/A] Exudate Color: [2:red, brown] [3:red, brown] [N/A:N/A] Wound Margin: [2:Distinct, outline attached Distinct, outline attached N/A] Granulation Amount: [2:Medium (34-66%)] [3:None Present (0%)] [N/A:N/A] Granulation Quality: [2:Red, Pink] [3:N/A] [N/A:N/A] Necrotic Amount: [2:Medium (34-66%)] [3:Large (67-100%)] [N/A:N/A] Necrotic Tissue: [2:Adherent Slough] [3:Eschar, Adherent Slough N/A] Epithelialization: [2:None] [3:None] [N/A:N/A] Periwound Skin Texture: No Abnormalities Noted  No Abnormalities Noted N/A Periwound Skin [2:No Abnormalities Noted No Abnormalities Noted N/A] Moisture: [name], CONFER. ([id number]) Periwound Skin Color: No Abnormalities Noted No Abnormalities Noted N/A Temperature: No Abnormality No Abnormality N/A Tenderness on Yes Yes N/A Palpation: Wound Preparation: Ulcer Cleansing: Ulcer Cleansing: N/A Rinsed/Irrigated with Rinsed/Irrigated with Saline Saline Topical Anesthetic Topical Anesthetic Applied: Other: lidocaine Applied:  Other: lidocaine 4% 4% Treatment Notes Electronic Signature(s) Signed: 09/29/2016 4:29:00 PM By: Alric Quan Entered By: Alric Quan on 09/26/2016 11:24:04 [name], [name] ([id number]) -------------------------------------------------------------------------------- [place] Details Patient Name: Jeremy Gang D. Date of Service: 09/26/2016 10:30 AM Medical Record Number: [id number] Patient Account Number: [id number] Date of Birth/Sex: 1940-09-13 (76 y.o. Male) Treating RN: Ahmed Prima Primary Care Aitana Burry: [name] Other Clinician: Referring Sherwin Hollingshed: [name] Treating Sharicka Pogorzelski/Extender: Melburn Hake, HOYT Weeks in Treatment: 0 Active Inactive ` Abuse / Safety / Falls / Self Care Management Nursing Diagnoses: Potential for falls Goals: Patient will not experience any injury related to falls Date Initiated: 09/26/2016 Target Resolution Date: 01/10/2017 Goal Status: Active Interventions: Assess: immobility, friction, shearing, incontinence upon admission and as needed Assess impairment of mobility on admission and as needed per policy Notes: ` Nutrition Nursing Diagnoses: Imbalanced nutrition Potential for alteratiion in Nutrition/Potential for imbalanced nutrition Goals: Patient/caregiver agrees to and verbalizes understanding of need to use nutritional supplements and/or vitamins as prescribed Date Initiated: 09/26/2016 Target Resolution Date: 01/10/2017 Goal Status: Active Interventions: Assess patient nutrition upon admission and as needed per policy Notes: ` Orientation to the Wound Care Program [name], [name] ([id number]) Nursing Diagnoses: Knowledge deficit related to the wound healing center program Goals: Patient/caregiver will verbalize understanding of the [place] Date Initiated: 09/26/2016 Target Resolution Date: 10/11/2016 Goal Status: Active Interventions: Provide education on orientation to the  wound center Notes: ` Pain, Acute or Chronic Nursing Diagnoses: Pain, acute or chronic: actual or potential Potential alteration in comfort, pain Goals: Patient/caregiver will verbalize adequate pain control between visits Date Initiated: 09/26/2016 Target Resolution Date: 01/10/2017 Goal Status: Active Interventions: Complete pain assessment as per visit requirements Notes: ` Pressure Nursing Diagnoses: Knowledge deficit related to causes and risk factors for pressure ulcer development Knowledge deficit related to management of pressures ulcers Potential for impaired tissue integrity related to pressure, friction, moisture, and shear Goals: Patient will remain free from development of additional pressure ulcers Date Initiated: 09/26/2016 Target Resolution Date: 01/10/2017 Goal Status: Active Interventions: Assess: immobility, friction, shearing, incontinence upon admission and as needed Assess potential for pressure ulcer upon admission and as needed [name], [name] ([id number]) Provide education on pressure ulcers Notes: ` Wound/Skin Impairment Nursing Diagnoses: Impaired tissue integrity Knowledge deficit related to ulceration/compromised skin integrity Goals: Ulcer/skin breakdown will have a volume reduction of 80% by week 12 Date Initiated: 09/26/2016 Target Resolution Date: 01/03/2017 Goal Status: Active Interventions: Assess patient/caregiver ability to perform ulcer/skin care regimen upon admission and as needed Assess ulceration(s) every visit Notes: Electronic Signature(s) Signed: 09/29/2016 4:29:00 PM By: Alric Quan Entered By: Alric Quan on 09/26/2016 11:23:41 [name], [name] ([id number]) -------------------------------------------------------------------------------- Pain Assessment Details Patient Name: Jeremy Gang D. Date of Service: 09/26/2016 10:30 AM Medical Record Number: [id number] Patient Account Number: [id number] Date of Birth/Sex:  February 05, 1941 (76 y.o. Male) Treating RN: Ahmed Prima Primary Care Zyaira Vejar: [name] Other Clinician: Referring Patrick Sohm: [name] Treating Saranne Crislip/Extender: Melburn Hake, HOYT Weeks in Treatment: 0 Active Problems Location of Pain Severity and Description of Pain Patient Has Paino No Site Locations With Dressing Change: No Pain Management and  Medication Current Pain Management: Electronic Signature(s) Signed: 09/29/2016 4:29:00 PM By: Alric Quan Entered By: Alric Quan on 09/26/2016 10:46:11 [name], MOUNT[place] D. ([id number]) -------------------------------------------------------------------------------- Patient/Caregiver Education Details Patient Name: Jeremy Gang D. Date of Service: 09/26/2016 10:30 AM Medical Record Number: [id number] Patient Account Number: [id number] Date of Birth/Gender: 1940/05/26 (76 y.o. Male) Treating RN: Ahmed Prima Primary Care Physician: [name] Other Clinician: Referring Physician: [name] Treating Physician/Extender: Melburn Hake, HOYT Weeks in Treatment: 0 Education Assessment Education Provided To: Patient and Caregiver niece Education Topics Provided Pressure: Handouts: Pressure Ulcers: Care and Offloading Methods: Explain/Verbal Responses: State content correctly Welcome To [facility]: Handouts: Welcome To [facility] Methods: Explain/Verbal Responses: State content correctly Wound/Skin Impairment: Handouts: Other: change dressing as ordered Methods: Demonstration, Explain/Verbal Responses: State content correctly Electronic Signature(s) Signed: 09/29/2016 4:29:00 PM By: Alric Quan Entered By: Alric Quan on 09/26/2016 12:04:02 Blades, Kris D. ([id number]) -------------------------------------------------------------------------------- Wound Assessment Details Patient Name: Jeremy Gang D. Date of Service: 09/26/2016 10:30 AM Medical Record Number:  [id number] Patient Account Number: [id number] Date of Birth/Sex: [birth year] (76 y.o. Male) Treating RN: Ahmed Prima Primary Care [place]: [name] Other Clinician: Referring Lorriane Dehart: [name] Treating Antwone Capozzoli/Extender: [name], HOYT Weeks in Treatment: 0 Wound Status Wound Number: 2 Primary Pressure Ulcer Etiology: Wound Location: Left Calcaneus Wound Open Wounding Event: Pressure Injury Status: Date Acquired: 05/29/2016 Comorbid Anemia, Chronic Obstructive Pulmonary Weeks Of Treatment: 0 History: Disease (COPD), Arrhythmia, Clustered Wound: No Hypertension, Dementia, Seizure Disorder Photos Photo Uploaded By: Alric Quan on 09/26/2016 13:57:53 Wound Measurements Length: (cm) 1.3 Width: (cm) 1.5 Depth: (cm) 0.3 Area: (cm) 1.532 Volume: (cm) 0.459 % Reduction in Area: % Reduction in Volume: Epithelialization: None Tunneling: No Undermining: No Wound Description Classification: Category/Stage II Foul Odor Aft Wound Margin: Distinct, outline attached Slough/Fibrin Exudate Amount: Large Exudate Type: Serosanguineous Exudate Color: red, brown er Cleansing: No o Yes Wound Bed Granulation Amount: Medium (34-66%) Granulation Quality: Red, Pink Necrotic Amount: Medium (34-66%) Olberding, Izaak D. ([id number]) Necrotic Quality: Adherent Slough Periwound Skin Texture Texture Color No Abnormalities Noted: No No Abnormalities Noted: No Moisture Temperature / Pain No Abnormalities Noted: No Temperature: No Abnormality Tenderness on Palpation: Yes Wound Preparation Ulcer Cleansing: Rinsed/Irrigated with Saline Topical Anesthetic Applied: Other: lidocaine 4%, Treatment Notes Wound #2 (Left Calcaneus) 1. Cleansed with: Clean wound with Normal Saline 2. Anesthetic Topical Lidocaine 4% cream to wound bed prior to debridement 4. Dressing Applied: Santyl Ointment 5. Secondary Dressing Applied Dry Gauze Foam Kerlix/Conform 7. Secured  with Tape Notes heel cup Electronic Signature(s) Signed: 09/29/2016 4:29:00 PM By: Alric Quan Entered By: Alric Quan on 09/26/2016 11:11:01 Penland, Surya D. ([id number]) -------------------------------------------------------------------------------- Wound Assessment Details Patient Name: Jeremy Gang D. Date of Service: 09/26/2016 10:30 AM Medical Record Number: [id number] Patient Account Number: [id number] Date of Birth/Sex: 1940/08/07 (76 y.o. Male) Treating RN: Ahmed Prima Primary Care Nelda Luckey: [name] Other Clinician: Referring Tateanna Bach: [name] Treating Leanza Shepperson/Extender: [name], HOYT Weeks in Treatment: 0 Wound Status Wound Number: 3 Primary Atypical Etiology: Wound Location: Left Foot - Plantar Wound Open Wounding Event: Gradually Appeared Status: Date Acquired: 09/12/2016 Comorbid Anemia, Chronic Obstructive Pulmonary Weeks Of Treatment: 0 History: Disease (COPD), Arrhythmia, Clustered Wound: No Hypertension, Dementia, Seizure Disorder Photos Photo Uploaded By: Alric Quan on 09/26/2016 13:58:15 Wound Measurements Length: (cm) 2 Width: (cm) 1 Depth: (cm) 0.1 Area: (cm) 1.571 Volume: (cm) 0.157 % Reduction in Area: % Reduction in Volume: Epithelialization: None Tunneling: No Undermining: No Wound Description Classification: Partial Thickness Foul Odor Afte  Wound Margin: Distinct, outline attached Slough/Fibrino Exudate Amount: Large Exudate Type: Serosanguineous Exudate Color: red, brown r Cleansing: No Yes Wound Bed Granulation Amount: None Present (0%) Necrotic Amount: Large (67-100%) Necrotic Quality: Eschar, Adherent [address] Hiep D. ([id number]) Periwound Skin Texture Texture Color No Abnormalities Noted: No No Abnormalities Noted: No Moisture Temperature / Pain No Abnormalities Noted: No Temperature: No Abnormality Tenderness on Palpation: Yes Wound Preparation Ulcer Cleansing: Rinsed/Irrigated  with Saline Topical Anesthetic Applied: Other: lidocaine 4%, Electronic Signature(s) Signed: 09/29/2016 4:29:00 PM By: Alric Quan Entered By: Alric Quan on 09/26/2016 11:14:07 Woodhams, Barett D. ([id number]) -------------------------------------------------------------------------------- Vitals Details Patient Name: Jeremy Gang D. Date of Service: 09/26/2016 10:30 AM Medical Record Number: [id number] Patient Account Number: [id number] Date of Birth/Sex: 11/28/1940 (75 y.o. Male) Treating RN: Ahmed Prima Primary Care Shenea Giacobbe: [name] Other Clinician: Referring Milayna Rotenberg: [name] Treating Avonna Iribe/Extender: Melburn Hake, HOYT Weeks in Treatment: 0 Vital Signs Time Taken: 10:46 Temperature (F): 97.7 Weight (lbs): 123.8 Pulse (bpm): 44 Source: Measured Respiratory Rate (breaths/min): 18 Blood Pressure (mmHg): 173/60 Reference Range: 80 - 120 mg / dl Electronic Signature(s) Signed: 09/29/2016 4:29:00 PM By: Alric Quan Entered By: Alric Quan on 09/26/2016 10:49:47

## 2016-09-30 NOTE — Progress Notes (Signed)
DONTEL, HARSHBERGER ([id number]) Visit Report for 09/26/2016 Chief Complaint Document Details Patient Name: [name], [name] Date of Service: 09/26/2016 10:30 AM Medical Record Number: [id number] Patient Account Number: [id number] Date of Birth/Sex: Nov 15, 1940 (76 y.o. Male) Treating RN: Ahmed Prima Primary Care Provider: [name] Other Clinician: Referring Provider: [name] Treating Provider/Extender: Melburn Hake, Jailin Manocchio Weeks in Treatment: 0 Information Obtained from: Patient Chief Complaint Patient is at the clinic for treatment of an open pressure ulcer the left heel Electronic Signature(s) Signed: 09/26/2016 5:31:15 PM By: Worthy Keeler [name] Entered By: Worthy Keeler on 09/26/2016 12:52:58 Doten, Mahesh D. ([id number]) -------------------------------------------------------------------------------- Debridement Details Patient Name: Jeremy Gang D. Date of Service: 09/26/2016 10:30 AM Medical Record Number: [id number] Patient Account Number: [id number] Date of Birth/Sex: 1940/08/05 (76 y.o. Male) Treating RN: Ahmed Prima Primary Care Provider: [name] Other Clinician: Referring Provider: [name] Treating Provider/Extender: Melburn Hake, Flossie Wexler Weeks in Treatment: 0 Debridement Performed for Wound #3 Left,Plantar Foot Assessment: Performed By: Physician [name], Stpehen Petitjean E., [name] Debridement: Debridement Pre-procedure Verification/Time Out Yes - 11:24 Taken: Start Time: 11:25 Pain Control: Lidocaine 4% Topical Solution Level: Skin/Subcutaneous Tissue Total Area Debrided (L x 2 (cm) x 1 (cm) = 2 (cm) W): Tissue and other Viable, Non-Viable, Exudate, Fibrin/Slough, Subcutaneous material debrided: Instrument: Curette Bleeding: Minimum Hemostasis Achieved: Pressure End Time: 11:28 Procedural Pain: 0 Post Procedural Pain: 0 Response to Treatment: Procedure was tolerated well Post Debridement Measurements of Total Wound Length: (cm) 2 Width:  (cm) 1 Depth: (cm) 0.1 Volume: (cm) 0.157 Character of Wound/Ulcer Post Stable Debridement: Post Procedure Diagnosis Same as Pre-procedure Electronic Signature(s) Signed: 09/26/2016 5:31:15 PM By: Worthy Keeler [name] Signed: 09/29/2016 4:29:00 PM By: Alric Quan Entered By: Alric Quan on 09/26/2016 11:28:48 Shaker, Harden D. ([id number]) -------------------------------------------------------------------------------- Debridement Details Patient Name: Jeremy Gang D. Date of Service: 09/26/2016 10:30 AM Medical Record Number: [id number] Patient Account Number: [id number] Date of Birth/Sex: 1941-02-06 (76 y.o. Male) Treating RN: Ahmed Prima Primary Care Provider: [name] Other Clinician: Referring Provider: [name] Treating Provider/Extender: Melburn Hake, Zayah Keilman Weeks in Treatment: 0 Debridement Performed for Wound #2 Left Calcaneus Assessment: Performed By: Physician [name], Epifanio Labrador E., [name] Debridement: Debridement Pre-procedure Verification/Time Out Yes - 11:24 Taken: Start Time: 11:29 Pain Control: Lidocaine 4% Topical Solution Level: Skin/Subcutaneous Tissue Total Area Debrided (L x 1.3 (cm) x 1.5 (cm) = 1.95 (cm) W): Tissue and other Viable, Non-Viable, Exudate, Fibrin/Slough, Subcutaneous material debrided: Instrument: Curette Bleeding: Minimum Hemostasis Achieved: Pressure End Time: 11:32 Procedural Pain: 0 Post Procedural Pain: 0 Response to Treatment: Procedure was tolerated well Post Debridement Measurements of Total Wound Length: (cm) 1.3 Stage: Category/Stage II Width: (cm) 1.5 Depth: (cm) 0.3 Volume: (cm) 0.459 Character of Wound/Ulcer Post Requires Further Debridement: Debridement Post Procedure Diagnosis Same as Pre-procedure Electronic Signature(s) Signed: 09/26/2016 5:31:15 PM By: Worthy Keeler [name] Signed: 09/29/2016 4:29:00 PM By: Alric Quan Entered By: Alric Quan on 09/26/2016 11:32:05 Wajda, Lofton  D. ([id number]) -------------------------------------------------------------------------------- HPI Details Patient Name: Jeremy Gang D. Date of Service: 09/26/2016 10:30 AM Medical Record Number: [id number] Patient Account Number: [id number] Date of Birth/Sex: 07-31-1940 (76 y.o. Male) Treating RN: Ahmed Prima Primary Care Provider: [name] Other Clinician: Referring Provider: [name] Treating Provider/Extender: Melburn Hake, Glessie Eustice Weeks in Treatment: 0 History of Present Illness Location: left heel ulceration and left plantar foot ulcer Quality: Patient reports experiencing a dull pain to affected area(s). Severity: Patient states wound are getting worse. Duration: Patient has had the wound for > 2 months  prior to seeking treatment at the wound center Timing: Pain in wound is Intermittent (comes and goes Context: The wound appeared gradually over time Modifying Factors: Other treatment(s) tried include:local care and offloading Associated Signs and Symptoms: Patient reports having difficulty standing for long periods. HPI Description: 76 year old gentleman who is a reformed alcoholic for about 6 months now was referred to as by his PCP team for a left heel ulceration which she's had for about 2 months. He has a history of alcohol abuse, aneurysm, hypertensive renal disease, anemia of chronic kidney disease, atrial fibrillation with RVR, COPD, hypertension, seizure disorders, tobacco abuse, cerebral aneurysm repair in 1997 and some ice surgery. He continues to smoke about half a pack of cigarettes a day. the patient is not fully competent and has a part of attorney along with him today. 07/03/2016 -- x-ray of the left heel shows a soft tissue wound with no bony destruction or concerns for osteomyelitis. 07/10/2016 -- arterial studies are still pending. 07/30/2016 -- lower arterial examination done shows a right ABI of 0.85 and a left ABI of 0.59. 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 Bilateral CFA stenosis. Occluded left mid distal SFA. Three-vessel runoff bilaterally. The bilateral great toe pressures were also abnormal in the 10 digit toe PPGos are abnormal. PV consult suggested -- to see Dr. Fletcher Anon. appointment scheduled for this coming Tuesday 08/07/2016 -- was seen in the office by Dr. Annia Belt. He reviewed his arterial studies with multilevel disease including iliac and SFA and recommended an abdominal aortogram, lower extremity runoff and possible endovascular intervention. 09/26/16 patient is seen for evaluation today for his left heel wound which we were treating for him prior to his admission to the hospital and then subsequent admission to a skilled nursing facility. Unfortunately he also has a new wound on the plantar surface of his left foot which is new and appears to have been facility acquired. He is having some discomfort he tells me but then does not seem to experience any pain with palpation over the wound. Electronic Signature(s) Signed: 09/26/2016 5:31:15 PM By: Duanne Moron, Rod D. ([id number]) Entered By: Worthy Keeler on 09/26/2016 12:53:39 KEYSHON, STEIN D. ([id number]) -------------------------------------------------------------------------------- Physical Exam Details Patient Name: PIERS, BAADE D. Date of Service: 09/26/2016 10:30 AM Medical Record Number: [id number] Patient Account Number: [id number] Date of Birth/Sex: Feb 06, 1941 (76 y.o. Male) Treating RN: Ahmed Prima Primary Care Provider: [name] Other Clinician: Referring Provider: [name] Treating Provider/Extender: [name], Apollo Timothy Weeks in Treatment: 0 Constitutional Well-nourished and well-hydrated in no acute distress. Respiratory normal breathing without difficulty. clear to auscultation  bilaterally. Cardiovascular regular rate and rhythm with normal S1, S2. Psychiatric Patient is not able to cooperate in decision making regarding care. Patient is oriented to person only. pleasant and cooperative. Notes in regard to the plantar foot wound he has some eschar and Slough noted though there is no tenderness to palpation at this point. This did required debridement. The left heel wound also required debridement due to slough and he tolerated both well without complication. Electronic Signature(s) Signed: 09/26/2016 5:31:15 PM By: Worthy Keeler [name] Entered By: Worthy Keeler on 09/26/2016 12:54:29 BRODI, KARI D. ([id number]) -------------------------------------------------------------------------------- Physician Orders Details Patient Name: Jeremy Gang D. Date of Service: 09/26/2016 10:30 AM Medical Record Number: [id number] Patient Account Number: [id number] Date of Birth/Sex: December 08, 1940 (76 y.o. 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 o Cleanse wound with mild soap and water Wound #3 Left,Plantar Foot o Clean wound with Normal Saline. o Cleanse wound with mild soap and water Anesthetic Wound #2 Left Calcaneus o Topical Lidocaine 4% cream applied to wound bed prior to debridement - for clinic use Wound #3 Left,Plantar Foot o Topical Lidocaine 4% cream applied to wound bed prior to debridement - for clinic use Primary Wound Dressing Wound #2 Left  Calcaneus o Santyl Ointment o Plain packing gauze - moisten with saline and pack lightly in wound with the Santyl Wound #3 Left,Plantar Foot o Santyl Ointment Secondary Dressing Wound #2 Left Calcaneus o Dry Gauze o Gauze and Kerlix/Conform o Other - Allevyn Heel Cup Wound #3 Left,Plantar Foot Nessler, Mattheo D. ([id number]) o Dry Gauze o Kerlix and Coban o Foam - Restore Foam Dressing Change Frequency Wound #2 Left Calcaneus o Change dressing every day. Wound #3 Left,Plantar Foot o Change dressing every day. Follow-up Appointments Wound #2 Left Calcaneus o Return Appointment in 1 week. Wound #3 Left,Plantar Foot o Return Appointment in 1 week. Edema Control Wound #2 Left Calcaneus o Elevate legs to the level of the heart and pump ankles as often as possible Wound #3 Left,Plantar Foot o Elevate legs to the level of the heart and pump ankles as often as possible Off-Loading Wound #2 Left Calcaneus o Turn and reposition every 2 hours o Other: - Darco shoe Wound #3 Left,Plantar Foot o Turn and reposition every 2 hours o Other: - Darco shoe Additional Orders / Instructions Wound #2 Left Calcaneus o Increase protein intake. Wound #3 Left,Plantar Foot o Increase protein intake. Home Health Wound #2 Left Calcaneus o Continue Home Health Visits - [facility] o Home Health Nurse may visit PRN to address patientos wound care needs. KOLBE, DELMONACO ([id number]) o FACE TO FACE ENCOUNTER: MEDICARE and MEDICAID PATIENTS: I certify that this patient is under my care and that I had a face-to-face encounter that meets the physician face-to-face encounter requirements with this patient on this date. The encounter with the patient was in whole or in part for the following MEDICAL CONDITION: (primary reason for [place]) MEDICAL NECESSITY: I certify, that based on my findings, NURSING services are a medically necessary home health service.  HOME BOUND STATUS: I certify that my clinical findings support that this patient is homebound (i.e., Due to illness or injury, pt requires aid of supportive devices such as crutches, cane, wheelchairs, walkers, the use of special transportation or the assistance of another person to leave their place of residence. There is a normal inability to leave the home and doing so requires considerable and taxing effort. Other absences are for medical reasons / religious services and are infrequent or of short duration when for other reasons). o If current dressing causes regression in wound condition, may D/C ordered dressing product/s and apply Normal Saline Moist Dressing daily until next [place] / Other MD appointment. [place] of regression in wound condition at [phone]. o Please direct any NON-WOUND related issues/requests for orders to patient's  Primary Care Physician Wound #3 [place] Visits - [facility] o Home Health Nurse may visit PRN to address patientos wound care needs. o FACE TO FACE ENCOUNTER: MEDICARE and MEDICAID PATIENTS: I certify that this patient is under my care and that I had a face-to-face encounter that meets the physician face-to-face encounter requirements with this patient on this date. The encounter with the patient was in whole or in part for the following MEDICAL CONDITION: (primary reason for [place]) MEDICAL NECESSITY: I certify, that based on my findings, NURSING services are a medically necessary home health service. HOME BOUND STATUS: I certify that my clinical findings support that this patient is homebound (i.e., Due to illness or injury, pt requires aid of supportive devices such as crutches, cane, wheelchairs, walkers, the use of special transportation or the assistance of another person to leave their place of residence. There is a normal inability to leave the home and doing  so requires considerable and taxing effort. Other absences are for medical reasons / religious services and are infrequent or of short duration when for other reasons). o If current dressing causes regression in wound condition, may D/C ordered dressing product/s and apply Normal Saline Moist Dressing daily until next [place] / Other MD appointment. [place] of regression in wound condition at [phone]. o Please direct any NON-WOUND related issues/requests for orders to patient's Primary Care Physician Medications-please add to medication list. Wound #2 Left Calcaneus o Other: - Vitamin C, Zinc, MVI Wound #3 Left,Plantar Foot o Other: - Vitamin C, Zinc, MVI Notes Fryer, Askia D. ([id number]) We are going to treat both of these wounds with Santyl for the next week and then see how things stand following. If anything changes in the interim and specifically worsens his family will contact us for additional recommendations. Otherwise it was a pleasure caring for him today and hopefully we can fill these wounds quickly for him. Electronic Signature(s) Signed: 09/26/2016 5:31:15 PM By: Worthy Keeler [name] Entered By: Worthy Keeler on 09/26/2016 12:55:15 Stamour, Fredrik D. ([id number]) -------------------------------------------------------------------------------- Problem List Details Patient Name: JIGAR, ZIELKE D. Date of Service: 09/26/2016 10:30 AM Medical Record Number: [id number] Patient Account Number: [id number] Date of Birth/Sex: 1940/12/24 (76 y.o. Male) Treating RN: Ahmed Prima Primary Care Provider: [name] Other Clinician: Referring Provider: [name] Treating Provider/Extender: Melburn Hake, Kanijah Groseclose Weeks in Treatment: 0 Active Problems ICD-10 Encounter Code Description Active Date Diagnosis 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, stage 4 09/26/2016 Yes I70.245 Atherosclerosis of native arteries of left leg with ulceration  09/26/2016 Yes of other part of foot L97.522 Non-pressure chronic ulcer of other part of left foot with fat 09/26/2016 Yes layer exposed Inactive Problems Resolved Problems Electronic Signature(s) Signed: 09/26/2016 5:31:15 PM By: Worthy Keeler [name] Entered By: Worthy Keeler on 09/26/2016 11:35:09 Wedel, Nikolai D. ([id number]) -------------------------------------------------------------------------------- Progress Note Details Patient Name: Jeremy Gang D. Date of Service: 09/26/2016 10:30 AM Medical Record Number: [id number] Patient Account Number: [id number] Date of Birth/Sex: [birth year] (76 y.o. Male) Treating RN: Ahmed Prima Primary Care Provider: [name] Other Clinician: Referring Provider: [name] Treating Provider/Extender: Melburn Hake, Melinda Pottinger Weeks in Treatment: 0 Subjective Chief Complaint Information obtained from Patient Patient is at the clinic for treatment of an open pressure ulcer the left heel History of Present Illness (HPI) The following HPI elements were documented for the patient's wound: Location: left heel ulceration and left plantar foot ulcer Quality: Patient reports experiencing a dull  pain to affected area(s). Severity: Patient states wound are getting worse. Duration: Patient has had the wound for > 2 months prior to seeking treatment at the wound center Timing: Pain in wound is Intermittent (comes and goes Context: The wound appeared gradually over time Modifying Factors: Other treatment(s) tried include:local care and offloading Associated Signs and Symptoms: Patient reports having difficulty standing for long periods. 76 year old gentleman who is a reformed alcoholic for about 6 months now was referred to as by his PCP team for a left heel ulceration which she's had for about 2 months. He has a history of alcohol abuse, aneurysm, hypertensive renal disease, anemia of chronic kidney disease, atrial fibrillation with RVR,  COPD, hypertension, seizure disorders, tobacco abuse, cerebral aneurysm repair in 1997 and some ice surgery. He continues to smoke about half a pack of cigarettes a day. the patient is not fully competent and has a part of attorney along with him today. 07/03/2016 -- x-ray of the left heel shows a soft tissue wound with no bony destruction or concerns for osteomyelitis. 07/10/2016 -- arterial studies are still pending. 07/30/2016 -- lower arterial examination done shows a right ABI of 0.85 and a left ABI of 0.59. 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 Bilateral CFA stenosis. Occluded left mid distal SFA. Three-vessel runoff bilaterally. The bilateral great toe pressures were also abnormal in the 10 digit toe PPG s are abnormal. PV consult suggested -- to see Dr. Fletcher Anon. appointment scheduled for this coming Tuesday 08/07/2016 -- was seen in the office by Dr. Annia Belt. He reviewed his arterial studies with multilevel disease including iliac and SFA and recommended an abdominal aortogram, lower extremity runoff and possible endovascular intervention. 09/26/16 patient is seen for evaluation today for his left heel wound which we were treating for him prior to DEQUANDRE, CORDOVA. ([id number]) his admission to the hospital and then subsequent admission to a skilled nursing facility. Unfortunately he also has a new wound on the plantar surface of his left foot which is new and appears to have been facility acquired. He is having some discomfort he tells me but then does not seem to experience any pain with palpation over the wound. Wound History Patient presents with 2 open wounds that have been present for approximately 2 weeks. Patient has been treating wounds in the following manner: prisma ag. Laboratory tests have not been performed in the last month. Patient reportedly has not  tested positive for an antibiotic resistant organism. Patient reportedly has not tested positive for osteomyelitis. Patient reportedly has had testing performed to evaluate circulation in the legs. Patient experiences the following problems associated with their wounds: swelling. Patient History Information obtained from Patient. Allergies diltiazem, penicillin Family History Cancer - Mother, Heart Disease - Father, Siblings, Siblings. Social History Current some day smoker - 2-3 a day, Marital Status - Single, Drug Use - Prior History - 5 months clean, Caffeine Use - Daily. Medical History Hematologic/Lymphatic Patient has history of Anemia Neurologic Patient has history of Dementia Review of Systems (ROS) Constitutional Symptoms ([place]) The patient has no complaints or symptoms. Eyes Complains or has symptoms of Glasses / Contacts. Ear/Nose/Mouth/Throat The patient has no complaints or symptoms. Gastrointestinal The patient has no complaints or symptoms. Endocrine The patient has no complaints or symptoms. Genitourinary The patient has no complaints or symptoms. Immunological The patient has no complaints or symptoms. Integumentary (Skin) Complains  or has symptoms of Wounds. Musculoskeletal COLESTON, DIROSA ([id number]) The patient has no complaints or symptoms. Oncologic The patient has no complaints or symptoms. Psychiatric The patient has no complaints or symptoms. Objective Constitutional Well-nourished and well-hydrated in no acute distress. Vitals Time Taken: 10:46 AM, Weight: 123.8 lbs, Source: Measured, Temperature: 97.7 F, Pulse: 44 bpm, Respiratory Rate: 18 breaths/min, Blood Pressure: 173/60 mmHg. Respiratory normal breathing without difficulty. clear to auscultation bilaterally. Cardiovascular regular rate and rhythm with normal S1, S2. Psychiatric Patient is not able to cooperate in decision making regarding care. Patient is oriented to person  only. pleasant and cooperative. General Notes: in regard to the plantar foot wound he has some eschar and Slough noted though there is no tenderness to palpation at this point. This did required debridement. The left heel wound also required debridement due to slough and he tolerated both well without complication. Integumentary (Hair, Skin) Wound #2 status is Open. Original cause of wound was Pressure Injury. The wound is located on the Left Calcaneus. The wound measures 1.3cm length x 1.5cm width x 0.3cm depth; 1.532cm^2 area and 0.459cm^3 volume. There is no tunneling or undermining noted. There is a large amount of serosanguineous drainage noted. The wound margin is distinct with the outline attached to the wound base. There is medium (34-66%) red, pink granulation within the wound bed. There is a medium (34-66%) amount of necrotic tissue within the wound bed including Adherent Slough. Periwound temperature was noted as No Abnormality. The periwound has tenderness on palpation. Wound #3 status is Open. Original cause of wound was Gradually Appeared. The wound is located on the [place]. The wound measures 2cm length x 1cm width x 0.1cm depth; 1.571cm^2 area and 0.157cm^3 volume. There is no tunneling or undermining noted. There is a large amount of serosanguineous drainage noted. The wound margin is distinct with the outline attached to the wound base. There is no granulation within the wound bed. There is a large (67-100%) amount of necrotic tissue within the wound bed including Eschar and Adherent Slough. Periwound temperature was noted as No Swarthout, Mayford D. ([id number]) Abnormality. The periwound has tenderness on palpation. Assessment Active Problems ICD-10 L89.624 - Pressure ulcer of left heel, stage 4 I70.245 - Atherosclerosis of native arteries of left leg with ulceration of other part of foot L97.522 - Non-pressure chronic ulcer of other part of left foot with fat layer  exposed Procedures Wound #2 Pre-procedure diagnosis of Wound #2 is a Pressure Ulcer located on the Left Calcaneus . There was a Skin/Subcutaneous Tissue Debridement ([id number]) debridement with total area of 1.95 sq cm performed by [name], Whitney Bingaman E., [name]. with the following instrument(s): Curette to remove Viable and Non-Viable tissue/material including Exudate, Fibrin/Slough, and Subcutaneous after achieving pain control using Lidocaine 4% Topical Solution. A time out was conducted at 11:24, prior to the start of the procedure. A Minimum amount of bleeding was controlled with Pressure. The procedure was tolerated well with a pain level of 0 throughout and a pain level of 0 following the procedure. Post Debridement Measurements: 1.3cm length x 1.5cm width x 0.3cm depth; 0.459cm^3 volume. Post debridement Stage noted as Category/Stage II. Character of Wound/Ulcer Post Debridement requires further debridement. Post procedure Diagnosis Wound #2: Same as Pre-Procedure Wound #3 Pre-procedure diagnosis of Wound #3 is a To be determined located on the [place] . There was a Skin/Subcutaneous Tissue Debridement ([id number]) debridement with total area of 2 sq cm performed by [name], Kartik Fernando E., [name]. with  the following instrument(s): Curette to remove Viable and Non-Viable tissue/material including Exudate, Fibrin/Slough, and Subcutaneous after achieving pain control using Lidocaine 4% Topical Solution. A time out was conducted at 11:24, prior to the start of the procedure. A Minimum amount of bleeding was controlled with Pressure. The procedure was tolerated well with a pain level of 0 throughout and a pain level of 0 following the procedure. Post Debridement Measurements: 2cm length x 1cm width x 0.1cm depth; 0.157cm^3 volume. Character of Wound/Ulcer Post Debridement is stable. Post procedure Diagnosis Wound #3: Same as Pre-Procedure Kyne, Kentavious D. ([id number]) Plan Wound  Cleansing: Wound #2 Left Calcaneus: Clean wound with Normal Saline. Cleanse wound with mild soap and water Wound #3 Left,Plantar Foot: Clean wound with Normal Saline. Cleanse wound with mild soap and water Anesthetic: Wound #2 Left Calcaneus: Topical Lidocaine 4% cream applied to wound bed prior to debridement - for clinic use Wound #3 Left,Plantar Foot: Topical Lidocaine 4% cream applied to wound bed prior to debridement - for clinic use Primary Wound Dressing: Wound #2 Left Calcaneus: Santyl Ointment Plain packing gauze - moisten with saline and pack lightly in wound with the Santyl Wound #3 Left,Plantar Foot: Santyl Ointment Secondary Dressing: Wound #2 Left Calcaneus: Dry Gauze Gauze and Kerlix/Conform Other - Allevyn Heel Cup Wound #3 Left,Plantar Foot: Dry Gauze Kerlix and Coban Foam - Restore Foam Dressing Change Frequency: Wound #2 Left Calcaneus: Change dressing every day. Wound #3 Left,Plantar Foot: Change dressing every day. Follow-up Appointments: Wound #2 Left Calcaneus: Return Appointment in 1 week. Wound #3 Left,Plantar Foot: Return Appointment in 1 week. Edema Control: Wound #2 Left Calcaneus: Elevate legs to the level of the heart and pump ankles as often as possible Wound #3 Left,Plantar Foot: Elevate legs to the level of the heart and pump ankles as often as possible Off-Loading: Wound #2 Left Calcaneus: Turn and reposition every 2 hours Pertuit, Needham D. ([id number]) Other: - Darco shoe Wound #3 Left,Plantar Foot: Turn and reposition every 2 hours Other: - Darco shoe Additional Orders / Instructions: Wound #2 Left Calcaneus: Increase protein intake. Wound #3 Left,Plantar Foot: Increase protein intake. Home Health: Wound #2 Left Calcaneus: Continue Home Health Visits - [facility] Nurse may visit PRN to address patient s wound care needs. FACE TO FACE ENCOUNTER: MEDICARE and MEDICAID PATIENTS: I certify that this patient is under my  care and that I had a face-to-face encounter that meets the physician face-to-face encounter requirements with this patient on this date. The encounter with the patient was in whole or in part for the following MEDICAL CONDITION: (primary reason for [place]) MEDICAL NECESSITY: I certify, that based on my findings, NURSING services are a medically necessary home health service. HOME BOUND STATUS: I certify that my clinical findings support that this patient is homebound (i.e., Due to illness or injury, pt requires aid of supportive devices such as crutches, cane, wheelchairs, walkers, the use of special transportation or the assistance of another person to leave their place of residence. There is a normal inability to leave the home and doing so requires considerable and taxing effort. Other absences are for medical reasons / religious services and are infrequent or of short duration when for other reasons). If current dressing causes regression in wound condition, may D/C ordered dressing product/s and apply Normal Saline Moist Dressing daily until next [place] / Other MD appointment. [place] of regression in wound condition at [phone]. Please direct any NON-WOUND related issues/requests for  orders to patient's Primary Care Physician Wound #3 Left,Plantar Foot: [place] Nurse may visit PRN to address patient s wound care needs. FACE TO FACE ENCOUNTER: MEDICARE and MEDICAID PATIENTS: I certify that this patient is under my care and that I had a face-to-face encounter that meets the physician face-to-face encounter requirements with this patient on this date. The encounter with the patient was in whole or in part for the following MEDICAL CONDITION: (primary reason for [place]) MEDICAL NECESSITY: I certify, that based on my findings, NURSING services are a medically necessary home health service.  HOME BOUND STATUS: I certify that my clinical findings support that this patient is homebound (i.e., Due to illness or injury, pt requires aid of supportive devices such as crutches, cane, wheelchairs, walkers, the use of special transportation or the assistance of another person to leave their place of residence. There is a normal inability to leave the home and doing so requires considerable and taxing effort. Other absences are for medical reasons / religious services and are infrequent or of short duration when for other reasons). If current dressing causes regression in wound condition, may D/C ordered dressing product/s and apply Normal Saline Moist Dressing daily until next [place] / Other MD appointment. [place] of regression in wound condition at [phone]. Please direct any NON-WOUND related issues/requests for orders to patient's Primary Care Physician Medications-please add to medication list.: Wound #2 Left Calcaneus: Other: - Vitamin C, Zinc, MVI Wound #3 Left,Plantar Foot: Other: - Vitamin C, Zinc, MVI General Notes: We are going to treat both of these wounds with Santyl for the next week and then see how JABBAR, PALMERO D. ([id number]) things stand following. If anything changes in the interim and specifically worsens his family will contact us for additional recommendations. Otherwise it was a pleasure caring for him today and hopefully we can fill these wounds quickly for him. Electronic Signature(s) Signed: 09/26/2016 5:31:15 PM By: Worthy Keeler [name] Entered By: Worthy Keeler on 09/26/2016 12:55:29 KHAM, ZUCKERMAN D. ([id number]) -------------------------------------------------------------------------------- ROS/PFSH Details Patient Name: Jeremy Gang D. Date of Service: 09/26/2016 10:30 AM Medical Record Number: [id number] Patient Account Number: [id number] Date of Birth/Sex: 1940/07/11 (76 y.o. Male) Treating RN: Ahmed Prima Primary  Care Provider: [name] Other Clinician: Referring Provider: [name] Treating Provider/Extender: Melburn Hake, Dominiq Fontaine Weeks in Treatment: 0 Information Obtained From Patient Wound History Do you currently have one or more open woundso Yes How many open wounds do you currently haveo 2 Approximately how long have you had your woundso 2 weeks How have you been treating your wound(s) until nowo prisma ag Has your wound(s) ever healed and then re-openedo No Have you had any lab work done in the past montho No Have you tested positive for an antibiotic resistant organism (MRSA, VRE)o No Have you tested positive for osteomyelitis (bone infection)o No Have you had any tests for circulation on your legso Yes Where was the test doneo [facility] Have you had other problems associated with your woundso Swelling Eyes Complaints and Symptoms: Positive for: Glasses / Contacts Integumentary (Skin) Complaints and Symptoms: Positive for: Wounds Constitutional Symptoms (General Health) Complaints and Symptoms: No Complaints or Symptoms Ear/Nose/Mouth/Throat Complaints and Symptoms: No Complaints or Symptoms Hematologic/Lymphatic Medical History: Positive for: Anemia Respiratory Hauk, Kodie D. ([id number]) Medical History: Positive for: Chronic Obstructive Pulmonary Disease (COPD) Cardiovascular Medical History: Positive for: Arrhythmia - a-fib; Hypertension Gastrointestinal Complaints and Symptoms: No Complaints or  Symptoms Endocrine Complaints and Symptoms: No Complaints or Symptoms Genitourinary Complaints and Symptoms: No Complaints or Symptoms Immunological Complaints and Symptoms: No Complaints or Symptoms Musculoskeletal Complaints and Symptoms: No Complaints or Symptoms Neurologic Medical History: Positive for: Dementia; Seizure Disorder Oncologic Complaints and Symptoms: No Complaints or Symptoms Psychiatric Complaints and Symptoms: No Complaints or  Symptoms Immunizations Pneumococcal Vaccine: Received Pneumococcal Vaccination: Yes VILIAMI, BRACCO ([id number]) Family and Social History Cancer: Yes - Mother; Heart Disease: Yes - Father, Siblings, Siblings; Current some day smoker - 2-3 a day; Marital Status - Single; 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 ([id number]) -------------------------------------------------------------------------------- SuperBill Details Patient Name: Jeremy Gang D. Date of Service: 09/26/2016 Medical Record Number: [id number] Patient Account Number: [id number] Date of Birth/Sex: 1940-10-10 (76 y.o. 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 Facility Procedures CPT4 Code Description: [id number] [place] VISIT-LEV 3 EST PT Modifier: Quantity: 1 CPT4 Code Description: [id number] 11042 - DEB SUBQ TISSUE 20 SQ CM/< ICD-10 Description Diagnosis 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, 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

## 2016-09-30 NOTE — Progress Notes (Signed)
[name] ([id number]) Visit Report for 09/26/2016 Abuse/Suicide Risk Screen Details Patient Name: [name], [name] Date of Service: 09/26/2016 10:30 AM Medical Record Number: [id number] Patient Account Number: [id number] Date of Birth/Sex: May 04, 1940 (76 y.o. Male) Treating RN: Ahmed Prima Primary Care Paytience Bures: [name] Other Clinician: Referring Ericha Whittingham: [name] Treating Kallen Mccrystal/Extender: [name], HOYT Weeks in Treatment: 0 Abuse/Suicide Risk Screen Items Answer ABUSE/SUICIDE RISK SCREEN: Has anyone close to you tried to hurt or harm you recentlyo No Do you feel uncomfortable with anyone in your familyo No Has anyone forced you do things that you didnot want to doo No Do you have any thoughts of harming yourselfo No Patient displays signs or symptoms of abuse and/or neglect. No Electronic Signature(s) Signed: 09/29/2016 4:29:00 PM By: Alric Quan Entered By: Alric Quan on 09/26/2016 10:53:38 Dunagan, Markice D. ([id number]) -------------------------------------------------------------------------------- Activities of Daily Living Details Patient Name: [name], [name] Date of Service: 09/26/2016 10:30 AM Medical Record Number: [id number] Patient Account Number: [id number] Date of Birth/Sex: [birth year] (76 y.o. Male) Treating RN: Ahmed Prima Primary Care Ameriah Lint: [name] Other Clinician: Referring Moraima Burd: [name] Treating Shalom Mcguiness/Extender: Melburn Hake, HOYT Weeks in Treatment: 0 Activities of Daily Living Items Answer Activities of Daily Living (Please select one for each item) Drive Automobile Not Able Take Medications Need Assistance Use Telephone Completely Able Care for Appearance Completely Able Use Toilet Completely Able Bath / Shower Need Assistance Dress Self Completely Able Feed Self Completely Able Walk Need Assistance Get In / Out Bed Need Assistance Housework Not Able Prepare Meals Need Assistance Handle  Money Need Assistance Shop for Self Need Assistance Electronic Signature(s) Signed: 09/29/2016 4:29:00 PM By: Alric Quan Entered By: Alric Quan on 09/26/2016 10:54:27 Komperda, Giovanie D. ([id number]) -------------------------------------------------------------------------------- Education Assessment Details Patient Name: Jeremy Gang D. Date of Service: 09/26/2016 10:30 AM Medical Record Number: [id number] Patient Account Number: [id number] Date of Birth/Sex: 03-Jun-1940 (76 y.o. Male) Treating RN: Ahmed Prima Primary Care Mariaisabel Bodiford: [name] Other Clinician: Referring Semone Orlov: [name] Treating Naman Spychalski/Extender: Melburn Hake, HOYT Weeks in Treatment: 0 Primary Learner Assessed: Patient Learning Preferences/Education Level/Primary Language Learning Preference: Explanation, Printed Material Highest Education Level: High School Preferred Language: English Cognitive Barrier Assessment/Beliefs Language Barrier: No Translator Needed: No Memory Deficit: No Emotional Barrier: No Cultural/Religious Beliefs Affecting Medical No Care: Physical Barrier Assessment Impaired Vision: Yes Glasses Impaired Hearing: No Decreased Hand dexterity: No Knowledge/Comprehension Assessment Knowledge Level: Medium Comprehension Level: Medium Ability to understand written Medium instructions: Ability to understand verbal Medium instructions: Motivation Assessment Anxiety Level: Calm Cooperation: Cooperative Education Importance: Acknowledges Need Interest in Health Problems: Asks Questions Perception: Coherent Willingness to Engage in Self- Medium Management Activities: Readiness to Engage in Self- Medium Management Activities: Electronic Signature(s) DISHAWN, BHARGAVA ([id number]) Signed: 09/29/2016 4:29:00 PM By: Alric Quan Entered By: Alric Quan on 09/26/2016 10:54:48 AARIAN, GRIFFIE D.  ([id number]) -------------------------------------------------------------------------------- Fall Risk Assessment Details Patient Name: Jeremy Gang D. Date of Service: 09/26/2016 10:30 AM Medical Record Number: [id number] Patient Account Number: [id number] Date of Birth/Sex: 07-13-1940 (76 y.o. Male) Treating RN: Ahmed Prima Primary Care Luwana Butrick: [name] Other Clinician: Referring Lambros Cerro: [name] Treating Thomos Domine/Extender: Melburn Hake, HOYT Weeks in Treatment: 0 Fall Risk Assessment Items Have you had 2 or more falls in the last 12 monthso 0 No Have you had any fall that resulted in injury in the last 12 monthso 0 No FALL RISK ASSESSMENT: History of falling - immediate or within 3 months 0 No Secondary diagnosis 15 Yes Ambulatory aid None/bed rest/wheelchair/nurse 0  No Crutches/cane/walker 0 No Furniture 0 No IV Access/Saline Lock 0 No Gait/Training Normal/bed rest/immobile 0 No Weak 0 No Impaired 20 Yes Mental Status Oriented to own ability 0 No Electronic Signature(s) Signed: 09/29/2016 4:29:00 PM By: Alric Quan Entered By: Alric Quan on 09/26/2016 10:55:13 Contino, Salvador D. ([id number]) -------------------------------------------------------------------------------- Foot Assessment Details Patient Name: Jeremy Gang D. Date of Service: 09/26/2016 10:30 AM Medical Record Number: [id number] Patient Account Number: [id number] Date of Birth/Sex: 1940-12-26 (76 y.o. Male) Treating RN: Ahmed Prima Primary Care Mei Suits: [name] Other Clinician: Referring Zayli Villafuerte: [name] Treating Myrna Vonseggern/Extender: Melburn Hake, HOYT Weeks in Treatment: 0 Foot Assessment Items Site Locations + = Sensation present, - = Sensation absent, C = Callus, U = Ulcer R = Redness, W = Warmth, M = Maceration, PU = Pre-ulcerative lesion F = Fissure, S = Swelling, D = Dryness Assessment Right: Left: Other Deformity: No No Prior Foot Ulcer: No No Prior  Amputation: No No Charcot Joint: No No Ambulatory Status: Ambulatory With Help Assistance Device: Walker Gait: Steady Electronic Signature(s) Signed: 09/29/2016 4:29:00 PM By: Alric Quan Entered By: Alric Quan on 09/26/2016 11:07:01 Marsico, Kaikoa D. ([id number]) -------------------------------------------------------------------------------- Nutrition Risk Assessment Details Patient Name: Jeremy Gang D. Date of Service: 09/26/2016 10:30 AM Medical Record Number: [id number] Patient Account Number: [id number] Date of Birth/Sex: [birth year] (76 y.o. Male) Treating RN: Ahmed Prima Primary Care Welton Bord: [name] Other Clinician: Referring Lemya Greenwell: [name] Treating Jules Baty/Extender: [name], HOYT Weeks in Treatment: 0 Height (in): Weight (lbs): 123.8 Body Mass Index (BMI): Nutrition Risk Assessment Items NUTRITION RISK SCREEN: I have an illness or condition that made me change the kind and/or 0 No amount of food I eat I eat fewer than two meals per day 0 No I eat few fruits and vegetables, or milk products 0 No I have three or more drinks of beer, liquor or wine almost every day 0 No I have tooth or mouth problems that make it hard for me to eat 0 No I don't always have enough money to buy the food I need 0 No I eat alone most of the time 0 No I take three or more different prescribed or over-the-counter drugs a 1 Yes day Without wanting to, I have lost or gained 10 pounds in the last six 0 No months I am not always physically able to shop, cook and/or feed myself 0 No Nutrition Protocols Good Risk Protocol Moderate Risk Protocol Electronic Signature(s) Signed: 09/29/2016 4:29:00 PM By: Alric Quan Entered By: Alric Quan on 09/26/2016 10:55:18

## 2016-10-03 ENCOUNTER — Encounter: Payer: Medicare Other | Admitting: Surgery

## 2016-10-03 DIAGNOSIS — I70245 Atherosclerosis of native arteries of left leg with ulceration of other part of foot: Secondary | ICD-10-CM | POA: Diagnosis not present

## 2016-10-05 NOTE — Progress Notes (Addendum)
[name] ([id number]) Visit Report for 10/03/2016 Chief Complaint Document Details Patient Name: [name], [name] Date of Service: 10/03/2016 9:45 AM Medical Record Number: [id number] Patient Account Number: [id number] Date of Birth/Sex: 1940/07/14 (76 y.o. Male) Treating RN: Ahmed Prima Primary Care Provider: [name] Other Clinician: Referring Provider: [name] Treating Provider/Extender: Frann Rider in Treatment: 1 Information Obtained from: Patient Chief Complaint Patient is at the clinic for treatment of an open pressure ulcer the left heel and most recently has also developed a ulcer on the plantar aspect of his left foot near the fourth metatarsal head. Electronic Signature(s) Signed: 10/03/2016 10:50:11 AM By: Christin Fudge MD, FACS Entered By: Christin Fudge on 10/03/2016 10:50:11 [place], PECHACEK D. ([id number]) -------------------------------------------------------------------------------- Debridement Details Patient Name: Jeremy Gang D. Date of Service: 10/03/2016 9:45 AM Medical Record Number: [id number] Patient Account Number: [id number] Date of Birth/Sex: 1940-10-24 (76 y.o. Male) Treating RN: Ahmed Prima Primary Care Provider: [name] Other Clinician: Referring Provider: [name] Treating Provider/Extender: Frann Rider in Treatment: 1 Debridement Performed for Wound #2 Left Calcaneus Assessment: Performed By: Physician Christin Fudge, MD Debridement: Debridement Pre-procedure Verification/Time Out Yes - 10:19 Taken: Start Time: 10:20 Pain Control: Lidocaine 4% Topical Solution Level: Skin/Subcutaneous Tissue Total Area Debrided (L x 0.7 (cm) x 1.4 (cm) = 0.98 (cm) W): Tissue and other Viable, Non-Viable, Exudate, Fibrin/Slough, Subcutaneous material debrided: Instrument: Curette Bleeding: Minimum Hemostasis Achieved: Pressure End Time: 10:22 Procedural Pain: 0 Post Procedural Pain: 0 Response to  Treatment: Procedure was tolerated well Post Debridement Measurements of Total Wound Length: (cm) 0.7 Stage: Category/Stage II Width: (cm) 1.4 Depth: (cm) 0.5 Volume: (cm) 0.385 Character of Wound/Ulcer Post Requires Further Debridement: Debridement Post Procedure Diagnosis Same as Pre-procedure Electronic Signature(s) Signed: 10/03/2016 10:49:27 AM By: Christin Fudge MD, FACS Signed: 10/03/2016 4:49:46 PM By: Alric Quan Entered By: Christin Fudge on 10/03/2016 10:49:27 Princip[place], Mylon D. ([id number]) -------------------------------------------------------------------------------- Debridement Details Patient Name: Jeremy Gang D. Date of Service: 10/03/2016 9:45 AM Medical Record Number: [id number] Patient Account Number: [id number] Date of Birth/Sex: Jun 09, 1940 (76 y.o. Male) Treating RN: Ahmed Prima Primary Care Provider: [name] Other Clinician: Referring Provider: [name] Treating Provider/Extender: Frann Rider in Treatment: 1 Debridement Performed for Wound #3 Left,Plantar Foot Assessment: Performed By: Physician Christin Fudge, MD Debridement: Debridement Pre-procedure Verification/Time Out Yes - 10:19 Taken: Start Time: 10:22 Pain Control: Lidocaine 4% Topical Solution Level: Skin/Subcutaneous Tissue Total Area Debrided (L x 1.8 (cm) x 0.7 (cm) = 1.26 (cm) W): Tissue and other Viable, Non-Viable, Exudate, Fibrin/Slough, Subcutaneous material debrided: Instrument: Curette Bleeding: Minimum Hemostasis Achieved: Pressure End Time: 10:24 Procedural Pain: 0 Post Procedural Pain: 0 Response to Treatment: Procedure was tolerated well Post Debridement Measurements of Total Wound Length: (cm) 1.8 Width: (cm) 0.7 Depth: (cm) 0.3 Volume: (cm) 0.297 Character of Wound/Ulcer Post Requires Further Debridement Debridement: Post Procedure Diagnosis Same as Pre-procedure Electronic Signature(s) Signed: 10/03/2016 10:49:36 AM By: Christin Fudge MD, FACS Signed: 10/03/2016 4:49:46 PM By: Alric Quan Entered By: Christin Fudge on 10/03/2016 10:49:36 Basil, Hadyn D. ([id number]) -------------------------------------------------------------------------------- HPI Details Patient Name: Jeremy Gang D. Date of Service: 10/03/2016 9:45 AM Medical Record Number: [id number] Patient Account Number: [id number] Date of Birth/Sex: [birth year] (76 y.o. Male) Treating RN: Ahmed Prima Primary Care Provider: [name] Other Clinician: Referring Provider: [name] Treating Provider/Extender: Frann Rider in Treatment: 1 History of Present Illness Location: left heel ulceration and left plantar foot ulcer Quality: Patient reports experiencing a dull pain to affected area(s). Severity: Patient states  wound are getting worse. Duration: Patient has had the wound for > 2 months prior to seeking treatment at the wound center Timing: Pain in wound is Intermittent (comes and goes Context: The wound appeared gradually over time Modifying Factors: Other treatment(s) tried include:local care and offloading Associated Signs and Symptoms: Patient reports having difficulty standing for long periods. HPI Description: 76 year old gentleman who is a reformed alcoholic for about 6 months now was referred to as by his PCP team for a left heel ulceration which she's had for about 2 months. He has a history of alcohol abuse, aneurysm, hypertensive renal disease, anemia of chronic kidney disease, atrial fibrillation with RVR, COPD, hypertension, seizure disorders, tobacco abuse, cerebral aneurysm repair in 1997 and some ice surgery. He continues to smoke about half a pack of cigarettes a day. the patient is not fully competent and has a part of attorney along with him today. 07/03/2016 -- x-ray of the left heel shows a soft tissue wound with no bony destruction or concerns for osteomyelitis. 07/10/2016 -- arterial studies are still  pending. 07/30/2016 -- lower arterial examination done shows a right ABI of 0.85 and a left ABI of 0.59. 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 Bilateral CFA stenosis. Occluded left mid distal SFA. Three-vessel runoff bilaterally. The bilateral great toe pressures were also abnormal in the 10 digit toe PPGos are abnormal. PV consult suggested -- to see Dr. Fletcher Anon. appointment scheduled for this coming Tuesday 08/07/2016 -- was seen in the office by Dr. Annia Belt. He reviewed his arterial studies with multilevel disease including iliac and SFA and recommended an abdominal aortogram, lower extremity runoff and possible endovascular intervention. 09/26/16 patient is seen for evaluation today for his left heel wound which we were treating for him prior to his admission to the hospital and then subsequent admission to a skilled nursing facility. Unfortunately he also has a new wound on the plantar surface of his left foot which is new and appears to have been facility acquired. He is having some discomfort he tells me but then does not seem to experience any pain with palpation over the wound. 10/03/2016 -- since I saw him last in early May, he had a vascular balloon angioplasty with an angiogram done by Dr. Fletcher Anon on 08/13/2016. he was found to have a short occlusion of the left distal SFA with heavy calcification and a 3 vessel runoff below the knee. Unsuccessful attempt at angioplasty of the left SFA due Goffredo, Camarion D. ([id number]) to inability to cross the occlusion. He had recommended an angioplasty of the left SFA via retrograde anterior tibial axis if the patient is agreeable. The patient was later admitted between May 14 to May 19, with the discharge diagnosis of seizures, tobacco use disorder, essential hypertension, peripheral vascular disease, chronic kidney disease,  lactic acidosis and pressure injury of skin. He was treated for a community-acquired pneumonia and then discharged to a skilled nursing facility. Oncology was working him up for follow-up of multiple myeloma. At follow-up Dr. 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 He would give him appropriate symptomatic treatment and follow-up in 2 months. Dr. Fletcher Anon saw him again on 09/09/2016 and increased his dose of Lasix to 40 mg twice daily. Since the ulcer was progressing well he did not plan to perform revascularization and would follow him up in  3 months. Electronic Signature(s) Signed: 10/03/2016 10:52:25 AM By: Christin Fudge MD, FACS Previous Signature: 10/03/2016 10:28:00 AM Version By: Christin Fudge MD, FACS Previous Signature: 10/03/2016 10:15:28 AM Version By: Christin Fudge MD, FACS Entered By: Christin Fudge on 10/03/2016 10:52:24 GARDINER, ESPANA ([id number]) -------------------------------------------------------------------------------- Physical Exam Details Patient Name: SUTTER, AHLGREN D. Date of Service: 10/03/2016 9:45 AM Medical Record Number: [id number] Patient Account Number: [id number] Date of Birth/Sex: 1940/04/10 (76 y.o. Male) Treating RN: Ahmed Prima Primary Care Provider: [name] Other Clinician: Referring Provider: [name] Treating Provider/Extender: Frann Rider in Treatment: 1 Constitutional . Pulse regular. Respirations normal and unlabored. Afebrile. . Eyes Nonicteric. Reactive to light. Ears, Nose, Mouth, and Throat Lips, teeth, and gums WNL.Marland Kitchen Moist mucosa without lesions. Neck supple and nontender. No palpable supraclavicular or cervical adenopathy. Normal sized without goiter. Respiratory WNL. No retractions.. Cardiovascular Pedal Pulses WNL. No clubbing, cyanosis or edema. Lymphatic No adneopathy. No adenopathy. No adenopathy. Musculoskeletal Adexa without  tenderness or enlargement.. Digits and nails w/o clubbing, cyanosis, infection, petechiae, ischemia, or inflammatory conditions.. Integumentary (Hair, Skin) No suspicious lesions. No crepitus or fluctuance. No peri-wound warmth or erythema. No masses.Marland Kitchen Psychiatric Judgement and insight Intact.. No evidence of depression, anxiety, or agitation.. Notes the ulcer on the left heel continues to have much more depth and debris and I sharply remove this with a #3 curet and bleeding controlled with pressure. The new plantar foot ulcer in the region of the fourth metatarsal head also has a lot of subcutaneous debris and I removed as much of it with a #3 curet and bleeding controlled with pressure Electronic Signature(s) Signed: 10/03/2016 10:53:10 AM By: Christin Fudge MD, FACS Entered By: Christin Fudge on 10/03/2016 10:53:10 [name] ([id number]) -------------------------------------------------------------------------------- Physician Orders Details Patient Name: Jeremy Gang D. Date of Service: 10/03/2016 9:45 AM Medical Record Number: [id number] Patient Account Number: [id number] Date of Birth/Sex: [birth year] (76 y.o. Male) Treating RN: Ahmed Prima Primary Care Provider: [name] Other Clinician: Referring Provider: [name] Treating Provider/Extender: Frann Rider in Treatment: 1 Verbal / Phone Orders: Yes Clinician: Carolyne Fiscal, Debi Read Back and Verified: Yes Diagnosis Coding Wound Cleansing Wound #2 Left Calcaneus o Clean wound with Normal Saline. o Cleanse wound with mild soap and water Wound #3 Left,Plantar Foot o Clean wound with Normal Saline. o Cleanse wound with mild soap and water Anesthetic Wound #2 Left Calcaneus o Topical Lidocaine 4% cream applied to wound bed prior to debridement - for clinic use Wound #3 Left,Plantar Foot o Topical Lidocaine 4% cream applied to wound bed prior to debridement - for clinic use Primary Wound  Dressing Wound #2 Left Calcaneus o Santyl Ointment o Plain packing gauze - moisten with saline and pack lightly in wound with the Santyl Wound #3 Left,Plantar Foot o Santyl Ointment Secondary Dressing Wound #2 Left Calcaneus o Dry Gauze o Gauze and Kerlix/Conform o Foam - Allevyn Heel Cup o Other - stretch netting #4 Wound #3 Left,Plantar Foot o Dry Gauze o Kerlix and Coban o Foam - Restore Foam o Other - stretch netting #4 Evitts, Sebasthian D. ([id number]) Dressing Change Frequency Wound #2 Left Calcaneus o Change dressing every day. Wound #3 Left,Plantar Foot o Change dressing every day. Follow-up Appointments Wound #2 Left Calcaneus o Return Appointment in 1 week. Wound #3 Left,Plantar Foot o Return Appointment in 1 week. Edema Control Wound #2 Left Calcaneus o Elevate legs to the level of the heart and pump ankles as often as possible Wound #3 Left,Plantar Foot o Elevate  legs to the level of the heart and pump ankles as often as possible Off-Loading Wound #2 Left Calcaneus o Turn and reposition every 2 hours o Other: - Darco shoe Wound #3 Left,Plantar Foot o Turn and reposition every 2 hours o Other: - Darco shoe Additional Orders / Instructions Wound #2 Left Calcaneus o Increase protein intake. Wound #3 Left,Plantar Foot o Increase protein intake. Home Health Wound #2 Left Calcaneus o Continue Home Health Visits - [facility] o Home Health Nurse may visit PRN to address patientos wound care needs. o FACE TO FACE ENCOUNTER: MEDICARE and MEDICAID PATIENTS: I certify that this patient is under my care and that I had a face-to-face encounter that meets the physician face-to-face encounter requirements with this patient on this date. The encounter with the patient was in whole or in part for the following MEDICAL CONDITION: (primary reason for [place]) MEDICAL NECESSITY: I certify, that based on my findings,  NURSING services are a medically PRINCETON, NABOR. ([id number]) necessary home health service. HOME BOUND STATUS: I certify that my clinical findings support that this patient is homebound (i.e., Due to illness or injury, pt requires aid of supportive devices such as crutches, cane, wheelchairs, walkers, the use of special transportation or the assistance of another person to leave their place of residence. There is a normal inability to leave the home and doing so requires considerable and taxing effort. Other absences are for medical reasons / religious services and are infrequent or of short duration when for other reasons). o If current dressing causes regression in wound condition, may D/C ordered dressing product/s and apply Normal Saline Moist Dressing daily until next [place] / Other MD appointment. [place] of regression in wound condition at [phone]. o Please direct any NON-WOUND related issues/requests for orders to patient's Primary Care Physician Wound #3 [place] Nurse may visit PRN to address patientos wound care needs. o FACE TO FACE ENCOUNTER: MEDICARE and MEDICAID PATIENTS: I certify that this patient is under my care and that I had a face-to-face encounter that meets the physician face-to-face encounter requirements with this patient on this date. The encounter with the patient was in whole or in part for the following MEDICAL CONDITION: (primary reason for [place]) MEDICAL NECESSITY: I certify, that based on my findings, NURSING services are a medically necessary home health service. HOME BOUND STATUS: I certify that my clinical findings support that this patient is homebound (i.e., Due to illness or injury, pt requires aid of supportive devices such as crutches, cane, wheelchairs, walkers, the use of special transportation or the assistance of another  person to leave their place of residence. There is a normal inability to leave the home and doing so requires considerable and taxing effort. Other absences are for medical reasons / religious services and are infrequent or of short duration when for other reasons). o If current dressing causes regression in wound condition, may D/C ordered dressing product/s and apply Normal Saline Moist Dressing daily until next [place] / Other MD appointment. [place] of regression in wound condition at [phone]. o Please direct any NON-WOUND related issues/requests for orders to patient's Primary Care Physician Medications-please add to medication list. Wound #2 Left Calcaneus o Other: - Vitamin C, Zinc, MVI Wound #3 Left,Plantar Foot o Other: - Vitamin C, Zinc, MVI Radiology o X-ray, foot - left Electronic Signature(s) Signed: 10/03/2016 4:14:17 PM  By: Christin Fudge MD, FACS Signed: 10/03/2016 4:49:46 PM By: Karren Burly ([id number]) Entered By: Alric Quan on 10/03/2016 10:42:37 [name] ([id number]) -------------------------------------------------------------------------------- Problem List Details Patient Name: HILMER, ALIBERTI D. Date of Service: 10/03/2016 9:45 AM Medical Record Number: [id number] Patient Account Number: [id number] Date of Birth/Sex: May 29, 1940 (76 y.o. Male) Treating RN: Ahmed Prima Primary Care Provider: [name] Other Clinician: Referring Provider: [name] Treating Provider/Extender: Frann Rider in Treatment: 1 Active Problems ICD-10 Encounter Code Description Active Date Diagnosis [phone] Pressure ulcer of left heel, stage 4 09/26/2016 Yes I70.245 Atherosclerosis of native arteries of left leg with ulceration 09/26/2016 Yes of other part of foot L97.522 Non-pressure chronic ulcer of other part of left foot with fat 09/26/2016 Yes layer exposed F17.218 Nicotine  dependence, cigarettes, with other nicotine- 10/03/2016 Yes induced disorders Inactive Problems Resolved Problems Electronic Signature(s) Signed: 10/03/2016 10:49:13 AM By: Christin Fudge MD, FACS Previous Signature: 10/03/2016 10:47:59 AM Version By: Christin Fudge MD, FACS Entered By: Christin Fudge on 10/03/2016 10:49:13 [place]vo, Caeleb D. ([id number]) -------------------------------------------------------------------------------- Progress Note Details Patient Name: Jeremy Gang D. Date of Service: 10/03/2016 9:45 AM Medical Record Number: [id number] Patient Account Number: [id number] Date of Birth/Sex: [birth year] (76 y.o. Male) Treating RN: Ahmed Prima Primary Care Provider: [name] Other Clinician: Referring Provider: [name] Treating Provider/Extender: Frann Rider in Treatment: 1 Subjective Chief Complaint Information obtained from Patient Patient is at the clinic for treatment of an open pressure ulcer the left heel and most recently has also developed a ulcer on the plantar aspect of his left foot near the fourth metatarsal head. History of Present Illness (HPI) The following HPI elements were documented for the patient's wound: Location: left heel ulceration and left plantar foot ulcer Quality: Patient reports experiencing a dull pain to affected area(s). Severity: Patient states wound are getting worse. Duration: Patient has had the wound for > 2 months prior to seeking treatment at the wound center Timing: Pain in wound is Intermittent (comes and goes Context: The wound appeared gradually over time Modifying Factors: Other treatment(s) tried include:local care and offloading Associated Signs and Symptoms: Patient reports having difficulty standing for long periods. 76 year old gentleman who is a reformed alcoholic for about 6 months now was referred to as by his PCP team for a left heel ulceration which she's had for about 2 months. He has a history of  alcohol abuse, aneurysm, hypertensive renal disease, anemia of chronic kidney disease, atrial fibrillation with RVR, COPD, hypertension, seizure disorders, tobacco abuse, cerebral aneurysm repair in 1997 and some ice surgery. He continues to smoke about half a pack of cigarettes a day. the patient is not fully competent and has a part of attorney along with him today. 07/03/2016 -- x-ray of the left heel shows a soft tissue wound with no bony destruction or concerns for osteomyelitis. 07/10/2016 -- arterial studies are still pending. 07/30/2016 -- lower arterial examination done shows a right ABI of 0.85 and a left ABI of 0.59. 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 Bilateral CFA stenosis. Occluded left mid distal SFA. Three-vessel runoff bilaterally. The bilateral great toe pressures were also abnormal in the 10 digit toe PPG s are abnormal. PV consult suggested -- to see Dr. Fletcher Anon. appointment scheduled for this coming Tuesday 08/07/2016 -- was seen in the office by Dr. Annia Belt. He reviewed his arterial studies with multilevel disease including  iliac and SFA and recommended an abdominal aortogram, lower extremity runoff and possible endovascular intervention. YOUNES, DEGEORGE ([id number]) 09/26/16 patient is seen for evaluation today for his left heel wound which we were treating for him prior to his admission to the hospital and then subsequent admission to a skilled nursing facility. Unfortunately he also has a new wound on the plantar surface of his left foot which is new and appears to have been facility acquired. He is having some discomfort he tells me but then does not seem to experience any pain with palpation over the wound. 10/03/2016 -- since I saw him last in early May, he had a vascular balloon angioplasty with an angiogram done by Dr. Fletcher Anon on 08/13/2016. he  was found to have a short occlusion of the left distal SFA with heavy calcification and a 3 vessel runoff below the knee. Unsuccessful attempt at angioplasty of the left SFA due to inability to cross the occlusion. He had recommended an angioplasty of the left SFA via retrograde anterior tibial axis if the patient is agreeable. The patient was later admitted between May 14 to May 19, with the discharge diagnosis of seizures, tobacco use disorder, essential hypertension, peripheral vascular disease, chronic kidney disease, lactic acidosis and pressure injury of skin. He was treated for a community-acquired pneumonia and then discharged to a skilled nursing facility. Oncology was working him up for follow-up of multiple myeloma. At follow-up Dr. 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 He would give him appropriate symptomatic treatment and follow-up in 2 months. Dr. Fletcher Anon saw him again on 09/09/2016 and increased his dose of Lasix to 40 mg twice daily. Since the ulcer was progressing well he did not plan to perform revascularization and would follow him up in 3 months. Objective Constitutional Pulse regular. Respirations normal and unlabored. Afebrile. Vitals Time Taken: 9:50 AM, Weight: 123.8 lbs, Source: Measured, Pulse: 47 bpm, Respiratory Rate: 18 breaths/min, Blood Pressure: 179/84 mmHg. Eyes Nonicteric. Reactive to light. Ears, Nose, Mouth, and Throat Lips, teeth, and gums WNL.Marland Kitchen Moist mucosa without lesions. Neck supple and nontender. No palpable supraclavicular or cervical adenopathy. Normal sized without goiter. Respiratory Mattioli, Quintel D. ([id number]) WNL. No retractions.. Cardiovascular Pedal Pulses WNL. No clubbing, cyanosis or edema. Lymphatic No adneopathy. No adenopathy. No adenopathy. Musculoskeletal Adexa without tenderness or enlargement.. Digits and nails w/o clubbing, cyanosis, infection,  petechiae, ischemia, or inflammatory conditions.Marland Kitchen Psychiatric Judgement and insight Intact.. No evidence of depression, anxiety, or agitation.. General Notes: the ulcer on the left heel continues to have much more depth and debris and I sharply remove this with a #3 curet and bleeding controlled with pressure. The new plantar foot ulcer in the region of the fourth metatarsal head also has a lot of subcutaneous debris and I removed as much of it with a #3 curet and bleeding controlled with pressure Integumentary (Hair, Skin) No suspicious lesions. No crepitus or fluctuance. No peri-wound warmth or erythema. No masses.. Wound #2 status is Open. Original cause of wound was Pressure Injury. The wound is located on the Left Calcaneus. The wound measures 0.7cm length x 1.4cm width x 0.5cm depth; 0.77cm^2 area and 0.385cm^3 volume. There is no tunneling noted. There is a large amount of serosanguineous drainage noted. The wound margin is distinct with the outline attached to the wound base. There is large (67-100%) red, pink granulation within the wound bed. There is a small (1-33%) amount of necrotic tissue within  the wound bed including Adherent Slough. Periwound temperature was noted as No Abnormality. The periwound has tenderness on palpation. Wound #3 status is Open. Original cause of wound was Gradually Appeared. The wound is located on the [place]. The wound measures 1.8cm length x 0.7cm width x 0.3cm depth; 0.99cm^2 area and 0.297cm^3 volume. There is no tunneling or undermining noted. There is a large amount of serosanguineous drainage noted. The wound margin is distinct with the outline attached to the wound base. There is no granulation within the wound bed. There is a large (67-100%) amount of necrotic tissue within the wound bed including Adherent Slough. Periwound temperature was noted as No Abnormality. The periwound has tenderness on palpation. 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 At the present time he is going to be under observation as far as interventional cardiology goes and then not proceeding with any attempts of further revascularization via his tibial vessels. After review today I have recommended: 1. Santyl ointment to both wounds and offloading has been discussed in great detail. 2. x-ray of the left foot 3. Reiterated the need to completely give up smoking 4. Adequate protein, vitamin A, vitamin C and zinc 5. Regular visits to the wound center Procedures Wound #2 Pre-procedure diagnosis of Wound #2 is a Pressure Ulcer located on the Left Calcaneus . There was a Skin/Subcutaneous Tissue Debridement ([id number]) debridement with total area of 0.98 sq cm performed by Christin Fudge, MD. with the following instrument(s): Curette to remove Viable and Non-Viable tissue/material including Exudate, Fibrin/Slough, and Subcutaneous after achieving pain control using Lidocaine 4% Topical Solution. A time out was conducted at 10:19, prior to the start of the procedure. A Minimum amount of bleeding was controlled with Pressure. The procedure was tolerated well with a pain level of 0 throughout and a pain level of 0 following the procedure. Post Debridement Measurements: 0.7cm length x 1.4cm width x 0.5cm depth; 0.385cm^3 volume. Post debridement Stage noted as Category/Stage II. Character of Wound/Ulcer Post  Debridement requires further debridement. Post procedure Diagnosis Wound #2: Same as Pre-Procedure Wound #3 Pre-procedure diagnosis of Wound #3 is a To be determined located on the [place] . There was a Skin/Subcutaneous Tissue Debridement ([id number]) debridement with total area of 1.26 sq cm performed by Christin Fudge, MD. with the following instrument(s): Curette to remove Viable and Non-Viable tissue/material including Exudate, Fibrin/Slough, and Subcutaneous after achieving pain control using Lidocaine 4% Topical Solution. A time out was conducted at 10:19, prior to the start of the procedure. A Minimum amount of bleeding was controlled with Pressure. The procedure was tolerated well with a pain level of 0 throughout and a pain level of 0 following the procedure. Post Debridement Measurements: 1.8cm length x 0.7cm width x 0.3cm depth; 0.297cm^3 volume. Character of Wound/Ulcer Post Debridement requires further debridement. Post procedure Diagnosis Wound #3: Same as Pre-Procedure Weimann, Pawel D. ([id number]) Plan Wound Cleansing: Wound #2 Left Calcaneus: Clean wound with Normal Saline. Cleanse wound with mild soap and water Wound #3  Left,Plantar Foot: Clean wound with Normal Saline. Cleanse wound with mild soap and water Anesthetic: Wound #2 Left Calcaneus: Topical Lidocaine 4% cream applied to wound bed prior to debridement - for clinic use Wound #3 Left,Plantar Foot: Topical Lidocaine 4% cream applied to wound bed prior to debridement - for clinic use Primary Wound Dressing: Wound #2 Left Calcaneus: Santyl Ointment Plain packing gauze - moisten with saline and pack lightly in wound with the Santyl Wound #3 Left,Plantar Foot: Santyl Ointment Secondary Dressing: Wound #2 Left Calcaneus: Dry Gauze Gauze and Kerlix/Conform Foam - Allevyn Heel Cup Other - stretch netting #4 Wound #3 Left,Plantar Foot: Dry Gauze Kerlix and Coban Foam - Restore Foam Other - stretch  netting #4 Dressing Change Frequency: Wound #2 Left Calcaneus: Change dressing every day. Wound #3 Left,Plantar Foot: Change dressing every day. Follow-up Appointments: Wound #2 Left Calcaneus: Return Appointment in 1 week. Wound #3 Left,Plantar Foot: Return Appointment in 1 week. Edema Control: Wound #2 Left Calcaneus: Elevate legs to the level of the heart and pump ankles as often as possible Wound #3 Left,Plantar Foot: Elevate legs to the level of the heart and pump ankles as often as possible Off-Loading: Labate, Llewellyn D. ([id number]) Wound #2 Left Calcaneus: Turn and reposition every 2 hours Other: - Darco shoe Wound #3 Left,Plantar Foot: Turn and reposition every 2 hours Other: - Darco shoe Additional Orders / Instructions: Wound #2 Left Calcaneus: Increase protein intake. Wound #3 Left,Plantar Foot: Increase protein intake. Home Health: Wound #2 Left Calcaneus: Continue Home Health Visits - [facility] Nurse may visit PRN to address patient s wound care needs. FACE TO FACE ENCOUNTER: MEDICARE and MEDICAID PATIENTS: I certify that this patient is under my care and that I had a face-to-face encounter that meets the physician face-to-face encounter requirements with this patient on this date. The encounter with the patient was in whole or in part for the following MEDICAL CONDITION: (primary reason for [place]) MEDICAL NECESSITY: I certify, that based on my findings, NURSING services are a medically necessary home health service. HOME BOUND STATUS: I certify that my clinical findings support that this patient is homebound (i.e., Due to illness or injury, pt requires aid of supportive devices such as crutches, cane, wheelchairs, walkers, the use of special transportation or the assistance of another person to leave their place of residence. There is a normal inability to leave the home and doing so requires considerable and taxing effort. Other absences  are for medical reasons / religious services and are infrequent or of short duration when for other reasons). If current dressing causes regression in wound condition, may D/C ordered dressing product/s and apply Normal Saline Moist Dressing daily until next [place] / Other MD appointment. [place] of regression in wound condition at [phone]. Please direct any NON-WOUND related issues/requests for orders to patient's Primary Care Physician Wound #3 Left,Plantar Foot: [place] Nurse may visit PRN to address patient s wound care needs. FACE TO FACE ENCOUNTER: MEDICARE and MEDICAID PATIENTS: I certify that this patient is under my care and that I had a face-to-face encounter that meets the physician face-to-face encounter requirements with this patient on this date. The encounter with the patient was in whole or in part for the following MEDICAL CONDITION: (primary reason for [place]) MEDICAL NECESSITY: I certify, that based on my findings, NURSING services are a medically necessary home health service. HOME BOUND STATUS: I certify that  my clinical findings support that this patient is homebound (i.e., Due to illness or injury, pt requires aid of supportive devices such as crutches, cane, wheelchairs, walkers, the use of special transportation or the assistance of another person to leave their place of residence. There is a normal inability to leave the home and doing so requires considerable and taxing effort. Other absences are for medical reasons / religious services and are infrequent or of short duration when for other reasons). If current dressing causes regression in wound condition, may D/C ordered dressing product/s and apply Normal Saline Moist Dressing daily until next [place] / Other MD appointment. [place] of regression in wound condition at [phone]. Please  direct any NON-WOUND related issues/requests for orders to patient's Primary Care Physician Medications-please add to medication list.: Wound #2 Left Calcaneus: Other: - Vitamin C, Zinc, MVI Wound #3 Left,Plantar Foot: Other: - Vitamin C, Zinc, MVI Marasigan, Kendal D. 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 At the present time he is going to be under observation as far as interventional cardiology goes and then not proceeding with any attempts of further revascularization via his tibial vessels. After review today I have recommended: 1. Santyl ointment to both wounds and offloading has been discussed in great detail. 2. x-ray of the left foot 3. Reiterated the need to completely give up smoking 4. Adequate protein, vitamin A, vitamin C and zinc 5. Regular visits to the wound center Electronic Signature(s) Signed: 10/03/2016 10:55:32 AM By: Christin Fudge MD, FACS Entered By: Christin Fudge on 10/03/2016 10:55:32 Zara, Presley D. ([id number]) -------------------------------------------------------------------------------- SuperBill Details Patient Name: Jeremy Gang D. Date of Service: 10/03/2016 Medical Record Number: [id number] Patient Account Number: [id number] Date of Birth/Sex: [birth year] (76 y.o. 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 F17.218  Nicotine dependence, cigarettes, with other nicotine-induced disorders Facility Procedures CPT4: Description Modifier Quantity Code [id number] 11042 - DEB SUBQ TISSUE 20 SQ CM/< 1 ICD-10 Description Diagnosis 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, 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  L97.522 Non-pressure chronic ulcer of other part of left foot with fat layer exposed F17.218 Nicotine dependence, cigarettes, with other nicotine-induced disorders Physician Procedures CPT4: Description Modifier Quantity Code [id number] 18299 - WC PHYS LEVEL 3 - EST PT 25 1 ICD-10 Description Diagnosis 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 ([id number]) Electronic Signature(s) Signed: 10/03/2016 10:55:51 AM By: Christin Fudge MD, FACS Entered By: Christin Fudge on 10/03/2016 10:55:51

## 2016-10-06 ENCOUNTER — Encounter (HOSPITAL_COMMUNITY): Payer: Self-pay

## 2016-10-06 DIAGNOSIS — L89890 Pressure ulcer of other site, unstageable: Secondary | ICD-10-CM | POA: Diagnosis not present

## 2016-10-06 DIAGNOSIS — I130 Hypertensive heart and chronic kidney disease with heart failure and stage 1 through stage 4 chronic kidney disease, or unspecified chronic kidney disease: Secondary | ICD-10-CM | POA: Diagnosis not present

## 2016-10-06 DIAGNOSIS — J449 Chronic obstructive pulmonary disease, unspecified: Secondary | ICD-10-CM

## 2016-10-06 DIAGNOSIS — L89620 Pressure ulcer of left heel, unstageable: Secondary | ICD-10-CM | POA: Diagnosis not present

## 2016-10-06 DIAGNOSIS — I5032 Chronic diastolic (congestive) heart failure: Secondary | ICD-10-CM

## 2016-10-06 DIAGNOSIS — N183 Chronic kidney disease, stage 3 (moderate): Secondary | ICD-10-CM

## 2016-10-06 DIAGNOSIS — F0390 Unspecified dementia without behavioral disturbance: Secondary | ICD-10-CM | POA: Diagnosis not present

## 2016-10-06 DIAGNOSIS — I4891 Unspecified atrial fibrillation: Secondary | ICD-10-CM

## 2016-10-06 NOTE — Progress Notes (Signed)
ANAY, RATHE ([id number]) Visit Report for 10/03/2016 Arrival Information Details Patient Name: [name] Date of Service: 10/03/2016 9:45 AM Medical Record Number: [id number] Patient Account Number: [id number] Date of Birth/Sex: [birth year] (76 y.o. Male) Treating RN: Ahmed Prima Primary Care Icesis Renn: [name] Other Clinician: Referring Jeanean Hollett: [name] Treating Lizzie An/Extender: Frann Rider in Treatment: 1 Visit Information History Since Last Visit All ordered tests and consults were completed: No Patient Arrived: Ambulatory Added or deleted any medications: No Arrival Time: 09:50 Any new allergies or adverse reactions: No Accompanied By: niece Had a fall or experienced change in No Transfer Assistance: None activities of daily living that may affect Patient Identification Verified: Yes risk of falls: Secondary Verification Process Yes Signs or symptoms of abuse/neglect since last No Completed: visito Patient Requires Transmission-Based No Hospitalized since last visit: No Precautions: Has Dressing in Place as Prescribed: Yes Patient Has Alerts: No Pain Present Now: No Electronic Signature(s) Signed: 10/03/2016 4:49:46 PM By: Alric Quan Entered By: Alric Quan on 10/03/2016 09:50:29 Mazo, [name] ([id number]) -------------------------------------------------------------------------------- Encounter Discharge Information Details Patient Name: [name] D. Date of Service: 10/03/2016 9:45 AM Medical Record Number: [id number] Patient Account Number: [id number] Date of Birth/Sex: 01-10-1941 (76 y.o. Male) Treating RN: Ahmed Prima Primary Care Jeremias Broyhill: [name] Other Clinician: Referring Myra Weng: [name] Treating Gabryella Murfin/Extender: Frann Rider in Treatment: 1 Encounter Discharge Information Items Discharge Pain Level: 0 Discharge Condition: Stable Ambulatory Status: Ambulatory Discharge  Destination: Home Transportation: Private Auto Accompanied By: niece Schedule Follow-up Appointment: Yes Medication Reconciliation completed and provided to Patient/Care No Sehaj Mcenroe: Provided on Clinical Summary of Care: 10/03/2016 Form Type Recipient Paper Patient [facility] Electronic Signature(s) Signed: 10/03/2016 10:37:10 AM By: Ruthine Dose Entered By: Ruthine Dose on 10/03/2016 10:37:10 [name], [name] ([id number]) -------------------------------------------------------------------------------- Lower Extremity Assessment Details Patient Name: [name] D. Date of Service: 10/03/2016 9:45 AM Medical Record Number: [id number] Patient Account Number: [id number] Date of Birth/Sex: 02-25-1941 (76 y.o. Male) Treating RN: Ahmed Prima Primary Care Avishai Reihl: [name] Other Clinician: Referring Steve Gregg: [name] Treating Najib Colmenares/Extender: Frann Rider in Treatment: 1 Vascular Assessment Pulses: Dorsalis Pedis Palpable: [Left:No] Doppler Audible: [Left:Yes] Posterior Tibial Extremity colors, hair growth, and conditions: Extremity Color: [Left:Hyperpigmented] Temperature of Extremity: [Left:Warm] Capillary Refill: [Left:< 3 seconds] Toe Nail Assessment Left: Right: Thick: No Discolored: No Deformed: No Improper Length and Hygiene: No Electronic Signature(s) Signed: 10/03/2016 4:49:46 PM By: Alric Quan Entered By: Alric Quan on 10/03/2016 09:58:20 [name], [name] ([id number]) -------------------------------------------------------------------------------- Multi Wound Chart Details Patient Name: [name] D. Date of Service: 10/03/2016 9:45 AM Medical Record Number: [id number] Patient Account Number: [id number] Date of Birth/Sex: January 15, 1941 (76 y.o. 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] [3:To be determined] [N/A:N/A] Comorbid History: [2:Anemia, Chronic Obstructive Pulmonary Disease (COPD), Arrhythmia, Hypertension, Arrhythmia, Hypertension, Dementia, Seizure Disorder] [3:Anemia, Chronic Obstructive Pulmonary Disease (COPD), Dementia, Seizure Disorder] [N/A:N/A] Date Acquired: [2:05/29/2016] [3:09/12/2016] [N/A:N/A] Weeks of Treatment: [2:1] [3:1] [N/A:N/A] Wound Status: [2:Open] [3:Open] [N/A:N/A] Pending Amputation on No [3:Yes] [N/A:N/A] Presentation: Measurements L x W x D 0.7x1.4x0.5 [3:1.8x0.7x0.3] [N/A:N/A] (cm) Area (cm) : [2:0.77] [3:0.99] [N/A:N/A] Volume (cm) : [2:0.385] [3:0.297] [N/A:N/A] % Reduction  in Area: [2:49.70%] [3:37.00%] [N/A:N/A] % Reduction in Volume: 16.10% [3:-89.20%] [N/A:N/A] Classification: [2:Category/Stage II] [3:Partial Thickness] [N/A:N/A] Exudate Amount: [2:Large] [3:Large] [N/A:N/A] Exudate Type: [2:Serosanguineous] [3:Serosanguineous] [N/A:N/A] Exudate Color: [2:red, brown] [3:red, brown] [N/A:N/A] Wound Margin: [2:Distinct, outline attached Distinct, outline attached N/A] Granulation Amount: [2:Large (67-100%)] [3:None Present (0%)] [N/A:N/A] Granulation Quality: [2:Red, Pink] [3:N/A] [N/A:N/A] Necrotic Amount: [2:Small (1-33%)] [3:Large (67-100%)] [N/A:N/A] Epithelialization: [2:None] [3:None] [N/A:N/A] Periwound Skin Texture: No Abnormalities Noted No Abnormalities Noted N/A Periwound Skin No Abnormalities Noted No Abnormalities Noted N/A Moisture: Periwound Skin Color: No Abnormalities Noted No Abnormalities Noted N/A Temperature: No  Abnormality No Abnormality N/A Tenderness on Yes Yes N/A Palpation: Wound Preparation: Ulcer Cleansing: Ulcer Cleansing: N/A Rinsed/Irrigated with Rinsed/Irrigated with Saline Saline Topical Anesthetic Topical Anesthetic Applied: Other: lidocaine Applied: Other: lidocaine 4% 4% Treatment Notes Electronic Signature(s) Signed: 10/03/2016 4:49:46 PM By: Alric Quan Entered By: Alric Quan on 10/03/2016 09:58:44 ERIKSON, DANZY D. ([id number]) -------------------------------------------------------------------------------- Multi-Disciplinary Care Plan Details Patient Name: [name] D. Date of Service: 10/03/2016 9:45 AM Medical Record Number: [id number] Patient Account Number: [id number] Date of Birth/Sex: 01-15-1941 (76 y.o. Male) Treating RN: Ahmed Prima Primary Care Maisa Bedingfield: [name] Other Clinician: Referring Colie Fugitt: [name] Treating Rebeccah Ivins/Extender: Frann Rider in Treatment: 1 Active Inactive ` Abuse / Safety / Falls / Self Care Management Nursing Diagnoses: Potential for falls Goals: Patient will not experience any injury related to falls Date Initiated: 09/26/2016 Target Resolution Date: 01/10/2017 Goal Status: Active Interventions: Assess: immobility, friction, shearing, incontinence upon admission and as needed Assess impairment of mobility on admission and as needed per policy Notes: ` Nutrition Nursing Diagnoses: Imbalanced nutrition Potential for alteratiion in Nutrition/Potential for imbalanced nutrition Goals: Patient/caregiver agrees to and verbalizes understanding of need to use nutritional supplements and/or vitamins as prescribed Date Initiated: 09/26/2016 Target Resolution Date: 01/10/2017 Goal Status: Active Interventions: Assess patient nutrition upon admission and as needed per policy Notes: ` Orientation to the Wound Care Program ROBIN, PAFFORD ([id number]) Nursing Diagnoses: Knowledge deficit related to  the wound healing center program Goals: Patient/caregiver will verbalize understanding of the [place] Date Initiated: 09/26/2016 Target Resolution Date: 10/11/2016 Goal Status: Active Interventions: Provide education on orientation to the wound center Notes: ` Pain, Acute or Chronic Nursing Diagnoses: Pain, acute or chronic: actual or potential Potential alteration in comfort, pain Goals: Patient/caregiver will verbalize adequate pain control between visits Date Initiated: 09/26/2016 Target Resolution Date: 01/10/2017 Goal Status: Active Interventions: Complete pain assessment as per visit requirements Notes: ` Pressure Nursing Diagnoses: Knowledge deficit related to causes and risk factors for pressure ulcer development Knowledge deficit related to management of pressures ulcers Potential for impaired tissue integrity related to pressure, friction, moisture, and shear Goals: Patient will remain free from development of additional pressure ulcers Date Initiated: 09/26/2016 Target Resolution Date: 01/10/2017 Goal Status: Active Interventions: Assess: immobility, friction, shearing, incontinence upon admission and as needed Assess potential for pressure ulcer upon admission and as needed SKIP, LITKE ([id number]) Provide education on pressure ulcers Notes: ` Wound/Skin Impairment Nursing Diagnoses: Impaired tissue integrity Knowledge deficit related to ulceration/compromised skin integrity Goals: Ulcer/skin breakdown will have a volume reduction of 80% by week 12 Date Initiated: 09/26/2016 Target Resolution Date: 01/03/2017 Goal Status: Active Interventions: Assess patient/caregiver ability to perform ulcer/skin care regimen upon admission and as needed Assess ulceration(s) every visit Notes: Electronic Signature(s) Signed: 10/03/2016 4:49:46 PM By: Alric Quan Entered By: Alric Quan on 10/03/2016 09:58:37 Mcbrearty, Eliel D.  ([id number]) -------------------------------------------------------------------------------- Pain Assessment Details Patient Name: Helene Kelp,  Tracker D. Date of Service: 10/03/2016 9:45 AM Medical Record Number: [id number] Patient Account Number: [id number] Date of Birth/Sex: [birth year] (76 y.o. Male) Treating RN: Ahmed Prima Primary Care Delynn Pursley: [name] Other Clinician: Referring Brianah Hopson: [name] Treating Latrease Kunde/Extender: Frann Rider in Treatment: 1 Active Problems Location of Pain Severity and Description of Pain Patient Has Paino No Site Locations With Dressing Change: No Pain Management and Medication Current Pain Management: Electronic Signature(s) Signed: 10/03/2016 4:49:46 PM By: Alric Quan Entered By: Alric Quan on 10/03/2016 09:50:36 Wnuk, Dymond D. ([id number]) -------------------------------------------------------------------------------- Patient/Caregiver Education Details Patient Name: [name] D. Date of Service: 10/03/2016 9:45 AM Medical Record Number: [id number] Patient Account Number: [id number] Date of Birth/Gender: September 21, 1940 (76 y.o. Male) Treating RN: Ahmed Prima Primary Care Physician: [name] Other Clinician: Referring Physician: [name] Treating Physician/Extender: Frann Rider in Treatment: 1 Education Assessment Education Provided To: Patient and Caregiver Education Topics Provided Wound/Skin Impairment: Handouts: Other: change dressing as ordered Methods: Demonstration, Explain/Verbal Responses: State content correctly Electronic Signature(s) Signed: 10/03/2016 4:49:46 PM By: Alric Quan Entered By: Alric Quan on 10/03/2016 10:12:26 Sheer, Alease Medina D. ([id number]) -------------------------------------------------------------------------------- Wound Assessment Details Patient Name: [name] D. Date of Service: 10/03/2016 9:45 AM Medical Record Number:  [id number] Patient Account Number: [id number] Date of Birth/Sex: 14-May-1940 (76 y.o. Male) Treating RN: Ahmed Prima Primary Care Lutisha Knoche: [name] Other Clinician: Referring Lional Icenogle: [name] Treating Lenix Benoist/Extender: Frann Rider in Treatment: 1 Wound Status Wound Number: 2 Primary Pressure Ulcer Etiology: Wound Location: Left Calcaneus Wound Open Wounding Event: Pressure Injury Status: Date Acquired: 05/29/2016 Comorbid Anemia, Chronic Obstructive Pulmonary Weeks Of Treatment: 1 History: Disease (COPD), Arrhythmia, Clustered Wound: No Hypertension, Dementia, Seizure Disorder Photos Photo Uploaded By: Alric Quan on 10/03/2016 10:55:14 Wound Measurements Length: (cm) 0.7 Width: (cm) 1.4 Depth: (cm) 0.5 Area: (cm) 0.77 Volume: (cm) 0.385 % Reduction in Area: 49.7% % Reduction in Volume: 16.1% Epithelialization: None Tunneling: No Wound Description Classification: Category/Stage II Foul Odor Aft Wound Margin: Distinct, outline attached Slough/Fibrin Exudate Amount: Large Exudate Type: Serosanguineous Exudate Color: red, brown er Cleansing: No o Yes Wound Bed Granulation Amount: Large (67-100%) Granulation Quality: Red, Pink Necrotic Amount: Small (1-33%) Swann, Guinn D. ([id number]) Necrotic Quality: Adherent Slough Periwound Skin Texture Texture Color No Abnormalities Noted: No No Abnormalities Noted: No Moisture Temperature / Pain No Abnormalities Noted: No Temperature: No Abnormality Tenderness on Palpation: Yes Wound Preparation Ulcer Cleansing: Rinsed/Irrigated with Saline Topical Anesthetic Applied: Other: lidocaine 4%, Treatment Notes Wound #2 (Left Calcaneus) 1. Cleansed with: Clean wound with Normal Saline 2. Anesthetic Topical Lidocaine 4% cream to wound bed prior to debridement 4. Dressing Applied: Santyl Ointment 5. Secondary Dressing Applied Dry Gauze Foam Kerlix/Conform 7. Secured  with Tape Notes heel cup, stretch netting Electronic Signature(s) Signed: 10/03/2016 4:49:46 PM By: Alric Quan Entered By: Alric Quan on 10/03/2016 09:57:04 Friis, Gaddiel D. ([id number]) -------------------------------------------------------------------------------- Wound Assessment Details Patient Name: [name] D. Date of Service: 10/03/2016 9:45 AM Medical Record Number: [id number] Patient Account Number: [id number] Date of Birth/Sex: 1940-08-18 (76 y.o. Male) Treating RN: Ahmed Prima Primary Care Akeia Perot: [name] Other Clinician: Referring Kynnadi Dicenso: [name] Treating Gerhart Ruggieri/Extender: Frann Rider in Treatment: 1 Wound Status Wound Number: 3 Primary To be determined Etiology: Wound Location: Left Foot - Plantar Wound Open Wounding Event: Gradually Appeared Status: Date Acquired: 09/12/2016 Comorbid Anemia, Chronic Obstructive Pulmonary Weeks Of Treatment: 1 History: Disease (COPD), Arrhythmia, Clustered Wound: No Hypertension, Dementia, Seizure Pending Amputation On Presentation Disorder Photos Photo Uploaded By: Alric Quan on  10/03/2016 10:55:15 Wound Measurements Length: (cm) 1.8 Width: (cm) 0.7 Depth: (cm) 0.3 Area: (cm) 0.99 Volume: (cm) 0.297 % Reduction in Area: 37% % Reduction in Volume: -89.2% Epithelialization: None Tunneling: No Undermining: No Wound Description Classification: Partial Thickness Foul Odor Afte Wound Margin: Distinct, outline attached Slough/Fibrino Exudate Amount: Large Exudate Type: Serosanguineous Exudate Color: red, brown r Cleansing: No Yes Wound Bed Granulation Amount: None Present (0%) Necrotic Amount: Large (67-100%) Necrotic Quality: Adherent [address], Riel D. ([id number]) Periwound Skin Texture Texture Color No Abnormalities Noted: No No Abnormalities Noted: No Moisture Temperature / Pain No Abnormalities Noted: No Temperature: No Abnormality Tenderness on  Palpation: Yes Wound Preparation Ulcer Cleansing: Rinsed/Irrigated with Saline Topical Anesthetic Applied: Other: lidocaine 4%, Treatment Notes Wound #3 (Left, Plantar Foot) 1. Cleansed with: Clean wound with Normal Saline 2. Anesthetic Topical Lidocaine 4% cream to wound bed prior to debridement 4. Dressing Applied: Santyl Ointment 5. Secondary Dressing Applied Dry Gauze Foam Kerlix/Conform 7. Secured with Tape Notes stretch netting Electronic Signature(s) Signed: 10/03/2016 4:49:46 PM By: Alric Quan Entered By: Alric Quan on 10/03/2016 09:57:50 Vankirk, Joziyah D. ([id number]) -------------------------------------------------------------------------------- Vitals Details Patient Name: [name] D. Date of Service: 10/03/2016 9:45 AM Medical Record Number: [id number] Patient Account Number: [id number] Date of Birth/Sex: 07/16/1940 (76 y.o. Male) Treating RN: Ahmed Prima Primary Care Flay Ghosh: [name] Other Clinician: Referring Ranie Chinchilla: [name] Treating Dayshia Ballinas/Extender: Frann Rider in Treatment: 1 Vital Signs Time Taken: 09:50 Pulse (bpm): 47 Weight (lbs): 123.8 Respiratory Rate (breaths/min): 18 Source: Measured Blood Pressure (mmHg): 179/84 Reference Range: 80 - 120 mg / dl Electronic Signature(s) Signed: 10/03/2016 4:49:46 PM By: Alric Quan Entered By: Alric Quan on 10/03/2016 09:52:28

## 2016-10-10 ENCOUNTER — Encounter: Payer: Medicare Other | Attending: Physician Assistant | Admitting: Physician Assistant

## 2016-10-10 ENCOUNTER — Ambulatory Visit
Admission: RE | Admit: 2016-10-10 | Discharge: 2016-10-10 | Disposition: A | Discharge status: Home / Self Care | Payer: Medicare Other | Source: Ambulatory Visit | Attending: Surgery | Admitting: Surgery

## 2016-10-10 ENCOUNTER — Other Ambulatory Visit: Payer: Self-pay | Admitting: Surgery

## 2016-10-10 DIAGNOSIS — F0390 Unspecified dementia without behavioral disturbance: Secondary | ICD-10-CM | POA: Diagnosis not present

## 2016-10-10 DIAGNOSIS — F17218 Nicotine dependence, cigarettes, with other nicotine-induced disorders: Secondary | ICD-10-CM | POA: Diagnosis not present

## 2016-10-10 DIAGNOSIS — I129 Hypertensive chronic kidney disease with stage 1 through stage 4 chronic kidney disease, or unspecified chronic kidney disease: Secondary | ICD-10-CM | POA: Insufficient documentation

## 2016-10-10 DIAGNOSIS — G40909 Epilepsy, unspecified, not intractable, without status epilepticus: Secondary | ICD-10-CM | POA: Insufficient documentation

## 2016-10-10 DIAGNOSIS — L89624 Pressure ulcer of left heel, stage 4: Secondary | ICD-10-CM | POA: Diagnosis not present

## 2016-10-10 DIAGNOSIS — Z880 Allergy status to penicillin: Secondary | ICD-10-CM | POA: Diagnosis not present

## 2016-10-10 DIAGNOSIS — D631 Anemia in chronic kidney disease: Secondary | ICD-10-CM | POA: Insufficient documentation

## 2016-10-10 DIAGNOSIS — N189 Chronic kidney disease, unspecified: Secondary | ICD-10-CM | POA: Diagnosis not present

## 2016-10-10 DIAGNOSIS — B999 Unspecified infectious disease: Secondary | ICD-10-CM | POA: Insufficient documentation

## 2016-10-10 DIAGNOSIS — I4891 Unspecified atrial fibrillation: Secondary | ICD-10-CM | POA: Insufficient documentation

## 2016-10-10 DIAGNOSIS — I70245 Atherosclerosis of native arteries of left leg with ulceration of other part of foot: Secondary | ICD-10-CM | POA: Diagnosis not present

## 2016-10-10 DIAGNOSIS — L97522 Non-pressure chronic ulcer of other part of left foot with fat layer exposed: Secondary | ICD-10-CM | POA: Insufficient documentation

## 2016-10-11 ENCOUNTER — Other Ambulatory Visit: Payer: Self-pay | Admitting: Internal Medicine

## 2016-10-11 ENCOUNTER — Other Ambulatory Visit: Payer: Self-pay | Admitting: Nurse Practitioner

## 2016-10-11 NOTE — Progress Notes (Signed)
TYLOR, COURTWRIGHT ([id number]) Visit Report for 10/10/2016 Chief Complaint Document Details Patient Name: [name], [name] Date of Service: 10/10/2016 2:45 PM Medical Record Number: [id number] Patient Account Number: [id number] Date of Birth/Sex: [birth year] (76 y.o. Male) Treating RN: Baruch Gouty, RN, BSN, Velva Harman Primary Care Provider: [name] Other Clinician: Referring Provider: [name] Treating Provider/Extender: Melburn Hake, Joscelin Fray Weeks in Treatment: 2 Information Obtained from: Patient Chief Complaint Patient is at the clinic for treatment of an open pressure ulcer the left heel and most recently has also developed a ulcer on the plantar aspect of his left foot near the fourth metatarsal head. Electronic Signature(s) Signed: 10/10/2016 4:47:40 PM By: Worthy Keeler [name] Entered By: Worthy Keeler on 10/10/2016 15:03:08 TEAGEN, MCLEARY D. ([id number]) -------------------------------------------------------------------------------- Debridement Details Patient Name: Jeremy Gang D. Date of Service: 10/10/2016 2:45 PM Medical Record Number: [id number] Patient Account Number: [id number] Date of Birth/Sex: Oct 11, 1940 (76 y.o. Male) Treating RN: Baruch Gouty, RN, BSN, [place] Primary Care Provider: [name] Other Clinician: Referring Provider: [name] Treating Provider/Extender: Melburn Hake, Kritika Stukes Weeks in Treatment: 2 Debridement Performed for Wound #3 Left,Plantar Foot Assessment: Performed By: Physician [name], Wright Gravely E., [name] Debridement: Debridement Pre-procedure Verification/Time Out Yes - 15:05 Taken: Start Time: 15:05 Pain Control: Lidocaine 4% Topical Solution Level: Skin/Subcutaneous Tissue Total Area Debrided (L x 1.7 (cm) x 0.5 (cm) = 0.85 (cm) W): Tissue and other Non-Viable, Exudate, Fat, Fibrin/Slough, Subcutaneous material debrided: Instrument: Curette Bleeding: Minimum Hemostasis Achieved: Pressure End Time: 15:07 Procedural Pain: 0 Post Procedural Pain:  0 Response to Treatment: Procedure was tolerated well Post Debridement Measurements of Total Wound Length: (cm) 1.7 Width: (cm) 0.5 Depth: (cm) 0.3 Volume: (cm) 0.2 Character of Wound/Ulcer Post Stable Debridement: Post Procedure Diagnosis Same as Pre-procedure Electronic Signature(s) Signed: 10/10/2016 4:15:16 PM By: Regan Lemming BSN, RN Signed: 10/10/2016 4:47:40 PM By: Worthy Keeler [name] Entered By: Regan Lemming on 10/10/2016 15:07:47 Rebel, Leviathan D. ([id number]) -------------------------------------------------------------------------------- Debridement Details Patient Name: Jeremy Gang D. Date of Service: 10/10/2016 2:45 PM Medical Record Number: [id number] Patient Account Number: [id number] Date of Birth/Sex: Mar 03, 1941 (76 y.o. Male) Treating RN: Baruch Gouty, RN, BSN, [place] Primary Care Provider: [name] Other Clinician: Referring Provider: [name] Treating Provider/Extender: Melburn Hake, Deakon Frix Weeks in Treatment: 2 Debridement Performed for Wound #2 Left Calcaneus Assessment: Performed By: Physician [name], Haroon Shatto E., [name] Debridement: Debridement Pre-procedure Verification/Time Out Yes - 15:07 Taken: Start Time: 15:07 Pain Control: Lidocaine 4% Topical Solution Level: Skin/Subcutaneous Tissue Total Area Debrided (L x 0.8 (cm) x 1.8 (cm) = 1.44 (cm) W): Tissue and other Non-Viable, Exudate, Fat, Fibrin/Slough, Subcutaneous material debrided: Instrument: Curette Bleeding: Minimum Hemostasis Achieved: Pressure End Time: 15:09 Procedural Pain: 0 Post Procedural Pain: 0 Response to Treatment: Procedure was tolerated well Post Debridement Measurements of Total Wound Length: (cm) 0.8 Stage: Category/Stage II Width: (cm) 1.8 Depth: (cm) 0.4 Volume: (cm) 0.452 Character of Wound/Ulcer Post Stable Debridement: Post Procedure Diagnosis Same as Pre-procedure Electronic Signature(s) Signed: 10/10/2016 4:15:16 PM By: Regan Lemming BSN, RN Signed: 10/10/2016  4:47:40 PM By: Worthy Keeler [name] Entered By: Regan Lemming on 10/10/2016 15:08:41 Lotts, Assad D. ([id number]) -------------------------------------------------------------------------------- HPI Details Patient Name: Jeremy Gang D. Date of Service: 10/10/2016 2:45 PM Medical Record Number: [id number] Patient Account Number: [id number] Date of Birth/Sex: Nov 20, 1940 (76 y.o. Male) Treating RN: Baruch Gouty, RN, BSN, [place] Primary Care Provider: [name] Other Clinician: Referring Provider: [name] Treating Provider/Extender: Melburn Hake, Jemel Ono Weeks in Treatment: 2 History of Present Illness Location: left heel ulceration and  left plantar foot ulcer Quality: Patient reports experiencing a dull pain to affected area(s). Severity: Patient states wound are getting worse. Duration: Patient has had the wound for > 2 months prior to seeking treatment at the wound center Timing: Pain in wound is Intermittent (comes and goes Context: The wound appeared gradually over time Modifying Factors: Other treatment(s) tried include:local care and offloading Associated Signs and Symptoms: Patient reports having difficulty standing for long periods. HPI Description: 76 year old gentleman who is a reformed alcoholic for about 6 months now was referred to as by his PCP team for a left heel ulceration which she's had for about 2 months. He has a history of alcohol abuse, aneurysm, hypertensive renal disease, anemia of chronic kidney disease, atrial fibrillation with RVR, COPD, hypertension, seizure disorders, tobacco abuse, cerebral aneurysm repair in 1997 and some ice surgery. He continues to smoke about half a pack of cigarettes a day. the patient is not fully competent and has a part of attorney along with him today. 07/03/2016 -- x-ray of the left heel shows a soft tissue wound with no bony destruction or concerns for osteomyelitis. 07/10/2016 -- arterial studies are still pending. 07/30/2016 -- lower  arterial examination done shows a right ABI of 0.85 and a left ABI of 0.59. 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 Bilateral CFA stenosis. Occluded left mid distal SFA. Three-vessel runoff bilaterally. The bilateral great toe pressures were also abnormal in the 10 digit toe PPGos are abnormal. PV consult suggested -- to see Dr. Fletcher Anon. appointment scheduled for this coming Tuesday 08/07/2016 -- was seen in the office by Dr. Annia Belt. He reviewed his arterial studies with multilevel disease including iliac and SFA and recommended an abdominal aortogram, lower extremity runoff and possible endovascular intervention. 09/26/16 patient is seen for evaluation today for his left heel wound which we were treating for him prior to his admission to the hospital and then subsequent admission to a skilled nursing facility. Unfortunately he also has a new wound on the plantar surface of his left foot which is new and appears to have been facility acquired. He is having some discomfort he tells me but then does not seem to experience any pain with palpation over the wound. 10/03/2016 -- since I saw him last in early May, he had a vascular balloon angioplasty with an angiogram done by Dr. Fletcher Anon on 08/13/2016. he was found to have a short occlusion of the left distal SFA with heavy calcification and a 3 vessel runoff below the knee. Unsuccessful attempt at angioplasty of the left SFA due Cara, Matyas D. ([id number]) to inability to cross the occlusion. He had recommended an angioplasty of the left SFA via retrograde anterior tibial axis if the patient is agreeable. The patient was later admitted between May 14 to May 19, with the discharge diagnosis of seizures, tobacco use disorder, essential hypertension, peripheral vascular disease, chronic kidney disease, lactic acidosis and pressure injury  of skin. He was treated for a community-acquired pneumonia and then discharged to a skilled nursing facility. Oncology was working him up for follow-up of multiple myeloma. At follow-up Dr. 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 He would give him appropriate symptomatic treatment and follow-up in 2 months. Dr. Fletcher Anon saw him again on 09/09/2016 and increased his dose of Lasix to 40 mg twice daily. Since the  ulcer was progressing well he did not plan to perform revascularization and would follow him up in 3 months. 10/10/16 on evaluation today patient appears to be doing better in regard to his left foot wounds. Both are improved compared to when I last saw him on evaluation. There is no evidence of infection Electronic Signature(s) Signed: 10/10/2016 4:47:40 PM By: Worthy Keeler [name] Entered By: Worthy Keeler on 10/10/2016 15:16:32 Lengacher, Ulis D. ([id number]) -------------------------------------------------------------------------------- Physical Exam Details Patient Name: Jeremy Gang D. Date of Service: 10/10/2016 2:45 PM Medical Record Number: [id number] Patient Account Number: [id number] Date of Birth/Sex: September 16, 1940 (75 y.o. Male) Treating RN: Baruch Gouty, RN, BSN, [place] Primary Care Provider: [name] Other Clinician: Referring Provider: [name] Treating Provider/Extender: [name], Lawrie Tunks Weeks in Treatment: 2 Constitutional Well-nourished and well-hydrated in no acute distress. Respiratory normal breathing without difficulty. Psychiatric Patient is not able to cooperate in decision making regarding care. Patient is oriented to person only. patient is confused but pleasant. Notes Both of patient's wounds are slough covered on evaluation. They did require sharp debridement which he tolerated with good result. Electronic Signature(s) Signed: 10/10/2016 4:47:40 PM By: Worthy Keeler [name] Entered  By: Worthy Keeler on 10/10/2016 15:17:30 RUDOLPH, DAOUST D. ([id number]) -------------------------------------------------------------------------------- Physician Orders Details Patient Name: Jeremy Gang D. Date of Service: 10/10/2016 2:45 PM Medical Record Number: [id number] Patient Account Number: [id number] Date of Birth/Sex: 05/21/1940 (76 y.o. Male) Treating RN: Baruch Gouty, RN, BSN, Velva Harman Primary Care Provider: [name] Other Clinician: Referring Provider: [name] Treating Provider/Extender: Melburn Hake, Brelynn Wheller Weeks in Treatment: 2 Verbal / Phone Orders: No Diagnosis Coding Wound Cleansing Wound #2 Left Calcaneus o Clean wound with Normal Saline. o Cleanse wound with mild soap and water Wound #3 Left,Plantar Foot o Clean wound with Normal Saline. o Cleanse wound with mild soap and water Anesthetic Wound #2 Left Calcaneus o Topical Lidocaine 4% cream applied to wound bed prior to debridement - for clinic use Wound #3 Left,Plantar Foot o Topical Lidocaine 4% cream applied to wound bed prior to debridement - for clinic use Primary Wound Dressing Wound #2 Left Calcaneus o Santyl Ointment o Plain packing gauze - moisten with saline and pack lightly in wound with the Santyl Wound #3 Left,Plantar Foot o Santyl Ointment Secondary Dressing Wound #2 Left Calcaneus o Dry Gauze o Gauze and Kerlix/Conform o Foam - Allevyn Heel Cup o Other - stretch netting #4 Wound #3 Left,Plantar Foot o Dry Gauze o Kerlix and Cob[place] o Foam - Restore Foam o Other - stretch netting #4 Brossard, Kutler D. ([id number]) Dressing Change Frequency Wound #2 Left Calcaneus o Change dressing every day. Wound #3 Left,Plantar Foot o Change dressing every day. Follow-up Appointments Wound #2 Left Calcaneus o Return Appointment in 1 week. Wound #3 Left,Plantar Foot o Return Appointment in 1 week. Edema Control Wound #2 Left Calcaneus o Elevate legs to the  level of the heart and pump ankles as often as possible Wound #3 Left,Plantar Foot o Elevate legs to the level of the heart and pump ankles as often as possible Off-Loading Wound #2 Left Calcaneus o Turn and reposition every 2 hours o Other: - Darco shoe Wound #3 Left,Plantar Foot o Turn and reposition every 2 hours o Other: - Darco shoe Additional Orders / Instructions Wound #2 Left Calcaneus o Increase protein intake. Wound #3 Left,Plantar Foot o Increase protein intake. Home Health Wound #2 Left Calcaneus o Continue Home Health Visits - [facility] o Home Health Nurse may visit PRN to  address patientos wound care needs. o FACE TO FACE ENCOUNTER: MEDICARE and MEDICAID PATIENTS: I certify that this patient is under my care and that I had a face-to-face encounter that meets the physician face-to-face encounter requirements with this patient on this date. The encounter with the patient was in whole or in part for the following MEDICAL CONDITION: (primary reason for [place]) MEDICAL NECESSITY: I certify, that based on my findings, NURSING services are a medically BENUEL, LY. ([id number]) necessary home health service. HOME BOUND STATUS: I certify that my clinical findings support that this patient is homebound (i.e., Due to illness or injury, pt requires aid of supportive devices such as crutches, cane, wheelchairs, walkers, the use of special transportation or the assistance of another person to leave their place of residence. There is a normal inability to leave the home and doing so requires considerable and taxing effort. Other absences are for medical reasons / religious services and are infrequent or of short duration when for other reasons). o If current dressing causes regression in wound condition, may D/C ordered dressing product/s and apply Normal Saline Moist Dressing daily until next [place] / Other MD appointment. [place] of regression in wound condition at [phone]. o Please direct any NON-WOUND related issues/requests for orders to patient's Primary Care Physician Wound #3 [place] Nurse may visit PRN to address patientos wound care needs. o FACE TO FACE ENCOUNTER: MEDICARE and MEDICAID PATIENTS: I certify that this patient is under my care and that I had a face-to-face encounter that meets the physician face-to-face encounter requirements with this patient on this date. The encounter with the patient was in whole or in part for the following MEDICAL CONDITION: (primary reason for [place]) MEDICAL NECESSITY: I certify, that based on my findings, NURSING services are a medically necessary home health service. HOME BOUND STATUS: I certify that my clinical findings support that this patient is homebound (i.e., Due to illness or injury, pt requires aid of supportive devices such as crutches, cane, wheelchairs, walkers, the use of special transportation or the assistance of another person to leave their place of residence. There is a normal inability to leave the home and doing so requires considerable and taxing effort. Other absences are for medical reasons / religious services and are infrequent or of short duration when for other reasons). o If current dressing causes regression in wound condition, may D/C ordered dressing product/s and apply Normal Saline Moist Dressing daily until next [place] / Other MD appointment. [place] of regression in wound condition at [phone]. o Please direct any NON-WOUND related issues/requests for orders to patient's Primary Care Physician Medications-please add to medication list. Wound #2 Left Calcaneus o Other: - Vitamin C, Zinc, MVI Wound #3 Left,Plantar Foot o Other: - Vitamin C, Zinc, MVI Notes We will continue with the  Santyl for the next week for both of patient's left lower extremity wounds. We will see were things stand at that point. If anything worsens in the interim he will contact our office or rather had his family for additional recommendations. Electronic Signature(s) Signed: 10/10/2016 4:47:40 PM By: Duanne Moron, Allah D. ([id number]) Entered By: Worthy Keeler on 10/10/2016 15:18:32 NEVAN, CREIGHTON D. ([id number]) -------------------------------------------------------------------------------- Problem List Details Patient Name: IMAAD, REUSS D. Date of Service: 10/10/2016 2:45 PM Medical Record Number: [id number] Patient Account Number: [id number] Date of Birth/Sex:  June 14, 1940 (76 y.o. 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 ([id number]) -------------------------------------------------------------------------------- Progress Note Details Patient Name: Jeremy Gang D. Date of Service: 10/10/2016 2:45 PM Medical Record Number: [id number] Patient Account Number: [id number] Date of Birth/Sex: 21-Mar-1941 (76 y.o. Male) Treating RN: Baruch Gouty, RN, BSN, Velva Harman Primary Care Provider: [name] Other Clinician: Referring  Provider: [name] Treating Provider/Extender: Melburn Hake, Toyia Jelinek Weeks in Treatment: 2 Subjective Chief Complaint Information obtained from Patient Patient is at the clinic for treatment of an open pressure ulcer the left heel and most recently has also developed a ulcer on the plantar aspect of his left foot near the fourth metatarsal head. History of Present Illness (HPI) The following HPI elements were documented for the patient's wound: Location: left heel ulceration and left plantar foot ulcer Quality: Patient reports experiencing a dull pain to affected area(s). Severity: Patient states wound are getting worse. Duration: Patient has had the wound for > 2 months prior to seeking treatment at the wound center Timing: Pain in wound is Intermittent (comes and goes Context: The wound appeared gradually over time Modifying Factors: Other treatment(s) tried include:local care and offloading Associated Signs and Symptoms: Patient reports having difficulty standing for long periods. 76 year old gentleman who is a reformed alcoholic for about 6 months now was referred to as by his PCP team for a left heel ulceration which she's had for about 2 months. He has a history of alcohol abuse, aneurysm, hypertensive renal disease, anemia of chronic kidney disease, atrial fibrillation with RVR, COPD, hypertension, seizure disorders, tobacco abuse, cerebral aneurysm repair in 1997 and some ice surgery. He continues to smoke about half a pack of cigarettes a day. the patient is not fully competent and has a part of attorney along with him today. 07/03/2016 -- x-ray of the left heel shows a soft tissue wound with no bony destruction or concerns for osteomyelitis. 07/10/2016 -- arterial studies are still pending. 07/30/2016 -- lower arterial examination done shows a right ABI of 0.85 and a left ABI of 0.59. Right TBI 0.61 and left ABI 0.24 More than 50% bilateral common and external iliac artery  stenosis with diffuse femoral and popliteal disease bilaterally. Bilateral CFA stenosis. Occluded left mid distal SFA. Three-vessel runoff bilaterally. The bilateral great toe pressures were also abnormal in the 10 digit toe PPG s are abnormal. PV consult suggested -- to see Dr. Fletcher Anon. appointment scheduled for this coming Tuesday 08/07/2016 -- was seen in the office by Dr. Annia Belt. He reviewed his arterial studies with multilevel disease including iliac and SFA and recommended an abdominal aortogram, lower extremity runoff and possible endovascular intervention. DINARI, STGERMAINE ([id number]) 09/26/16 patient is seen for evaluation today for his left heel wound which we were treating for him prior to his admission to the hospital and then subsequent admission to a skilled nursing facility. Unfortunately he also has  a new wound on the plantar surface of his left foot which is new and appears to have been facility acquired. He is having some discomfort he tells me but then does not seem to experience any pain with palpation over the wound. 10/03/2016 -- since I saw him last in early May, he had a vascular balloon angioplasty with an angiogram done by Dr. Fletcher Anon on 08/13/2016. he was found to have a short occlusion of the left distal SFA with heavy calcification and a 3 vessel runoff below the knee. Unsuccessful attempt at angioplasty of the left SFA due to inability to cross the occlusion. He had recommended an angioplasty of the left SFA via retrograde anterior tibial axis if the patient is agreeable. The patient was later admitted between May 14 to May 19, with the discharge diagnosis of seizures, tobacco use disorder, essential hypertension, peripheral vascular disease, chronic kidney disease, lactic acidosis and pressure injury of skin. He was treated for a community-acquired pneumonia and then discharged to a skilled nursing facility. Oncology was working him up for follow-up of multiple  myeloma. At follow-up Dr. 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 He would give him appropriate symptomatic treatment and follow-up in 2 months. Dr. Fletcher Anon saw him again on 09/09/2016 and increased his dose of Lasix to 40 mg twice daily. Since the ulcer was progressing well he did not plan to perform revascularization and would follow him up in 3 months. 10/10/16 on evaluation today patient appears to be doing better in regard to his left foot wounds. Both are improved compared to when I last saw him on evaluation. There is no evidence of infection Objective Constitutional Well-nourished and well-hydrated in no acute distress. Vitals Time Taken: 2:51 PM, Weight: 123.8 lbs, Temperature: 98.1 F, Pulse: 55 bpm, Respiratory Rate: 17 breaths/min, Blood Pressure: 157/48 mmHg. Respiratory normal breathing without difficulty. Psychiatric Patient is not able to cooperate in decision making regarding care. Patient is oriented to person only. patient is confused but pleasant. MATAI, CARPENITO ([id number]) General Notes: Both of patient's wounds are slough covered on evaluation. They did require sharp debridement which he tolerated with good result. Integumentary (Hair, Skin) Wound #2 status is Open. Original cause of wound was Pressure Injury. The wound is located on the Left Calcaneus. The wound measures 0.8cm length x 1.8cm width x 0.4cm depth; 1.131cm^2 area and 0.452cm^3 volume. There is Fat Layer (Subcutaneous Tissue) Exposed exposed. There is no tunneling or undermining noted. There is a large amount of serosanguineous drainage noted. The wound margin is distinct with the outline attached to the wound base. There is large (67-100%) red, pink granulation within the wound bed. There is a small (1-33%) amount of necrotic tissue within the wound bed including Adherent Slough. The periwound skin appearance exhibited: Callus.  The periwound skin appearance did not exhibit: Crepitus, Excoriation, Induration, Rash, Scarring, Dry/Scaly, Maceration, Atrophie Blanche, Cyanosis, Ecchymosis, Hemosiderin Staining, Mottled, Pallor, Rubor, Erythema. Periwound temperature was noted as No Abnormality. The periwound has tenderness on palpation. Wound #3 status is Open. Original cause of wound was Gradually Appeared. The wound is located on the [place]. The wound measures 1.7cm length x 0.5cm width x 0.3cm depth; 0.668cm^2 area and 0.2cm^3 volume. There is Fat Layer (Subcutaneous Tissue) Exposed exposed. There is no tunneling or undermining noted. There is a large amount of serosanguineous drainage noted. The wound margin is distinct with the outline attached to the wound base. There is  no granulation within the wound bed. There is a large (67-100%) amount of necrotic tissue within the wound bed including Adherent Slough. The periwound skin appearance exhibited: Callus. The periwound skin appearance did not exhibit: Crepitus, Excoriation, Induration, Rash, Scarring, Dry/Scaly, Maceration, Atrophie Blanche, Cyanosis, Ecchymosis, Hemosiderin Staining, Mottled, Pallor, Rubor, Erythema. Periwound temperature was noted as No Abnormality. The periwound has tenderness on palpation. 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 There was a Skin/Subcutaneous Tissue Debridement ([id number]) debridement with total area of 1.44 sq cm Broadwell, Ansel D. ([id number]) performed by [name], Markis Langland E., [name]. with the following  instrument(s): Curette to remove Non-Viable tissue/material including Exudate, Fat Layer (and Subcutaneous Tissue) Exposed, Fibrin/Slough, and Subcutaneous after achieving pain control using Lidocaine 4% Topical Solution. A time out was conducted at 15:07, prior to the start of the procedure. A Minimum amount of bleeding was controlled with Pressure. The procedure was tolerated well with a pain level of 0 throughout and a pain level of 0 following the procedure. Post Debridement Measurements: 0.8cm length x 1.8cm width x 0.4cm depth; 0.452cm^3 volume. Post debridement Stage noted as Category/Stage II. Character of Wound/Ulcer Post Debridement is stable. Post procedure Diagnosis Wound #2: Same as Pre-Procedure Wound #3 Pre-procedure diagnosis of Wound #3 is a Trauma, Other located on the Francis . There was a Skin/Subcutaneous Tissue Debridement ([id number]) debridement with total area of 0.85 sq cm performed by [name], Ginnifer Creelman E., [name]. with the following instrument(s): Curette to remove Non-Viable tissue/material including Exudate, Fat Layer (and Subcutaneous Tissue) Exposed, Fibrin/Slough, and Subcutaneous after achieving pain control using Lidocaine 4% Topical Solution. A time out was conducted at 15:05, prior to the start of the procedure. A Minimum amount of bleeding was controlled with Pressure. The procedure was tolerated well with a pain level of 0 throughout and a pain level of 0 following the procedure. Post Debridement Measurements: 1.7cm length x 0.5cm width x 0.3cm depth; 0.2cm^3 volume. Character of Wound/Ulcer Post Debridement is stable. Post procedure Diagnosis Wound #3: Same as Pre-Procedure Plan Wound Cleansing: Wound #2 Left Calcaneus: Clean wound with Normal Saline. Cleanse wound with mild soap and water Wound #3 Left,Plantar Foot: Clean wound with Normal Saline. Cleanse wound with mild soap and water Anesthetic: Wound #2 Left Calcaneus: Topical Lidocaine  4% cream applied to wound bed prior to debridement - for clinic use Wound #3 Left,Plantar Foot: Topical Lidocaine 4% cream applied to wound bed prior to debridement - for clinic use Primary Wound Dressing: Wound #2 Left Calcaneus: Santyl Ointment Plain packing gauze - moisten with saline and pack lightly in wound with the Santyl Wound #3 Left,Plantar Foot: Santyl Ointment Secondary Dressing: Wound #2 Left Calcaneus: STEFANO, TRULSON D. ([id number]) Dry Gauze Gauze and Kerlix/Conform Foam - Allevyn Heel Cup Other - stretch netting #4 Wound #3 Left,Plantar Foot: Dry Gauze Kerlix and Coban Foam - Restore Foam Other - stretch netting #4 Dressing Change Frequency: Wound #2 Left Calcaneus: Change dressing every day. Wound #3 Left,Plantar Foot: Change dressing every day. Follow-up Appointments: Wound #2 Left Calcaneus: Return Appointment in 1 week. Wound #3 Left,Plantar Foot: Return Appointment in 1 week. Edema Control:  Wound #2 Left Calcaneus: Elevate legs to the level of the heart and pump ankles as often as possible Wound #3 Left,Plantar Foot: Elevate legs to the level of the heart and pump ankles as often as possible Off-Loading: Wound #2 Left Calcaneus: Turn and reposition every 2 hours Other: - Darco shoe Wound #3 Left,Plantar Foot: Turn and reposition every 2 hours Other: - Darco shoe Additional Orders / Instructions: Wound #2 Left Calcaneus: Increase protein intake. Wound #3 Left,Plantar Foot: Increase protein intake. Home Health: Wound #2 Left Calcaneus: Continue Home Health Visits - [facility] Nurse may visit PRN to address patient s wound care needs. FACE TO FACE ENCOUNTER: MEDICARE and MEDICAID PATIENTS: I certify that this patient is under my care and that I had a face-to-face encounter that meets the physician face-to-face encounter requirements with this patient on this date. The encounter with the patient was in whole or in part for the following  MEDICAL CONDITION: (primary reason for [place]) MEDICAL NECESSITY: I certify, that based on my findings, NURSING services are a medically necessary home health service. HOME BOUND STATUS: I certify that my clinical findings support that this patient is homebound (i.e., Due to illness or injury, pt requires aid of supportive devices such as crutches, cane, wheelchairs, walkers, the use of special transportation or the assistance of another person to leave their place of residence. There is a normal inability to leave the home and doing so requires considerable and taxing effort. Other absences are for medical reasons / religious services and are infrequent or of short duration when for other reasons). If current dressing causes regression in wound condition, may D/C ordered dressing product/s and apply DEMITRIOUS, MCCANNON D. ([id number]) Normal Saline Moist Dressing daily until next [place] / Other MD appointment. [place] of regression in wound condition at [phone]. Please direct any NON-WOUND related issues/requests for orders to patient's Primary Care Physician Wound #3 Left,Plantar Foot: [place] Nurse may visit PRN to address patient s wound care needs. FACE TO FACE ENCOUNTER: MEDICARE and MEDICAID PATIENTS: I certify that this patient is under my care and that I had a face-to-face encounter that meets the physician face-to-face encounter requirements with this patient on this date. The encounter with the patient was in whole or in part for the following MEDICAL CONDITION: (primary reason for [place]) MEDICAL NECESSITY: I certify, that based on my findings, NURSING services are a medically necessary home health service. HOME BOUND STATUS: I certify that my clinical findings support that this patient is homebound (i.e., Due to illness or injury, pt requires aid of supportive devices such as crutches, cane,  wheelchairs, walkers, the use of special transportation or the assistance of another person to leave their place of residence. There is a normal inability to leave the home and doing so requires considerable and taxing effort. Other absences are for medical reasons / religious services and are infrequent or of short duration when for other reasons). If current dressing causes regression in wound condition, may D/C ordered dressing product/s and apply Normal Saline Moist Dressing daily until next [place] / Other MD appointment. [place] of regression in wound condition at [phone]. Please direct any NON-WOUND related issues/requests for orders to patient's Primary Care Physician Medications-please add to medication list.: Wound #2 Left Calcaneus: Other: - Vitamin C, Zinc, MVI Wound #3 Left,Plantar Foot: Other: - Vitamin C, Zinc, MVI General Notes: We will  continue with the Santyl for the next week for both of patient's left lower extremity wounds. We will see were things stand at that point. If anything worsens in the interim he will contact our office or rather had his family for additional recommendations. Electronic Signature(s) Signed: 10/10/2016 4:47:40 PM By: Worthy Keeler [name] Entered By: Worthy Keeler on 10/10/2016 15:18:42 Sabbagh, Nicky D. ([id number]) -------------------------------------------------------------------------------- SuperBill Details Patient Name: Jeremy Gang D. Date of Service: 10/10/2016 Medical Record Number: [id number] Patient Account Number: [id number] Date of Birth/Sex: May 22, 1940 (75 y.o. Male) Treating RN: Baruch Gouty, RN, BSN, [place] Primary Care Provider: [name] Other Clinician: Referring Provider: [name] Treating Provider/Extender: Melburn Hake, [place] Weeks in Treatment: 2 Diagnosis Coding ICD-10 Codes Code Description [phone] Pressure ulcer of left heel, stage 4 I70.245 Atherosclerosis of native arteries of  left leg with ulceration of other part of foot L97.522 Non-pressure chronic ulcer of other part of left foot with fat layer exposed F17.218 Nicotine dependence, cigarettes, with other nicotine-induced disorders Facility Procedures CPT4 Code Description: [id number] 11042 - DEB SUBQ TISSUE 20 SQ CM/< ICD-10 Description Diagnosis 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

## 2016-10-11 NOTE — Progress Notes (Signed)
[name], [name] ([id number]) Visit Report for 10/10/2016 Arrival Information Details Patient Name: [name], [name] Date of Service: 10/10/2016 2:45 PM Medical Record Number: [id number] Patient Account Number: [id number] Date of Birth/Sex: 01-15-1941 (76 y.o. Male) Treating RN: Jeremy Gouty, RN, BSN, Velva Harman Primary Care [name]: [name] Other Clinician: Referring [name]: [name] Treating [name]/Extender: [name] Weeks in Treatment: 2 Visit Information History Since Last Visit All ordered tests and consults were completed: No Patient Arrived: Ambulatory Added or deleted any medications: No Arrival Time: 14:49 Any new allergies or adverse reactions: No Accompanied By: niece Had a fall or experienced change in No Transfer Assistance: None activities of daily living that may affect Patient Identification Verified: Yes risk of falls: Secondary Verification Process Yes Signs or symptoms of abuse/neglect since last No Completed: visito Patient Requires Transmission-Based No Hospitalized since last visit: No Precautions: Has Dressing in Place as Prescribed: Yes Patient Has Alerts: No Pain Present Now: No Electronic Signature(s) Signed: 10/10/2016 4:15:16 PM By: [name] BSN, RN Entered By: [name] on 10/10/2016 14:50:25 [place], Jeremy Medina D. ([id number]) -------------------------------------------------------------------------------- Encounter Discharge Information Details Patient Name: Jeremy Gang D. Date of Service: 10/10/2016 2:45 PM Medical Record Number: [id number] Patient Account Number: [id number] Date of Birth/Sex: 02-27-1941 (76 y.o. Male) Treating RN: Jeremy Gouty, RN, BSN, Velva Harman Primary Care Mykiah Schmuck: [name] Other Clinician: Referring Janayia Burggraf: [name] Treating Alexsandra Shontz/Extender: [name] Weeks in Treatment: 2 Encounter Discharge Information Items Discharge Pain Level: 0 Discharge Condition: Stable Ambulatory Status:  Ambulatory Discharge Destination: Home Transportation: Private Auto Accompanied By: niece Schedule Follow-up Appointment: No Medication Reconciliation completed and provided to Patient/Care No [name]: Provided on Clinical Summary of Care: 10/10/2016 Form Type Recipient Paper Patient [facility] Electronic Signature(s) Signed: 10/10/2016 4:15:16 PM By: [name] BSN, RN Previous Signature: 10/10/2016 3:15:00 PM Version By: Ruthine Dose Entered By: [name] on 10/10/2016 15:15:23 Yohe, [name] ([id number]) -------------------------------------------------------------------------------- Lower Extremity Assessment Details Patient Name: Jeremy Gang D. Date of Service: 10/10/2016 2:45 PM Medical Record Number: [id number] Patient Account Number: [id number] Date of Birth/Sex: [birth year] (76 y.o. Male) Treating RN: Jeremy Gouty, RN, BSN, Velva Harman Primary Care Damont Balles: [name] Other Clinician: Referring Zvi Duplantis: [name] Treating Akhilesh Sassone/Extender: [name] Weeks in Treatment: 2 Vascular Assessment Claudication: Claudication Assessment [Left:None] Pulses: Dorsalis Pedis Palpable: [Left:Yes] Posterior Tibial Extremity colors, hair growth, and conditions: Extremity Color: [Left:Normal] Hair Growth on Extremity: [Left:Yes] Temperature of Extremity: [Left:Warm] Capillary Refill: [Left:< 3 seconds] Electronic Signature(s) Signed: 10/10/2016 4:15:16 PM By: [name] BSN, RN Entered By: [name] on 10/10/2016 14:52:00 Strassman, [name] ([id number]) -------------------------------------------------------------------------------- Multi Wound Chart Details Patient Name: Jeremy Gang D. Date of Service: 10/10/2016 2:45 PM Medical Record Number: [id number] Patient Account Number: [id number] Date of Birth/Sex: Aug 27, 1940 (76 y.o. 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] [3:Trauma, Other] [N/A:N/A] Comorbid History: [2:Anemia, Chronic Obstructive Pulmonary Disease (COPD), Arrhythmia, Hypertension, Arrhythmia, Hypertension, Dementia, Seizure Disorder] [3:Anemia, Chronic Obstructive Pulmonary Disease (COPD), Dementia, Seizure Disorder] [N/A:N/A] Date Acquired: [2:05/29/2016] [3:09/12/2016] [N/A:N/A] Weeks of Treatment: [2:2] [3:2] [N/A:N/A] Wound Status: [2:Open] [3:Open] [N/A:N/A] Pending Amputation on No [3:Yes] [N/A:N/A] Presentation: Measurements L x W x D 0.8x1.8x0.4 [  3:1.7x0.5x0.3] [N/A:N/A] (cm) Area (cm) : [2:1.131] [3:0.668] [N/A:N/A] Volume (cm) : [2:0.452] [3:0.2] [N/A:N/A] % Reduction in Area: [2:26.20%] [3:57.50%] [N/A:N/A] % Reduction in Volume: 1.50% [3:-27.40%] [N/A:N/A] Classification: [2:Category/Stage II] [3:Partial Thickness] [N/A:N/A] Exudate Amount: [2:Large] [3:Large] [N/A:N/A] Exudate Type: [2:Serosanguineous] [3:Serosanguineous] [N/A:N/A] Exudate Color: [2:red, brown] [3:red, brown] [N/A:N/A] Wound Margin: [2:Distinct, outline attached Distinct, outline attached N/A] Granulation Amount: [2:Large (67-100%)] [3:None Present (0%)] [N/A:N/A] Granulation Quality: [2:Red, Pink] [3:N/A] [N/A:N/A] Necrotic Amount: [2:Small (1-33%)] [3:Large (67-100%)] [N/A:N/A] Exposed Structures: [N/A:N/A] Fat Layer (Subcutaneous Fat Layer (Subcutaneous Tissue) Exposed: Yes Tissue) Exposed: Yes Fascia: No Fascia: No Tendon: No Tendon: No Muscle: No Muscle: No Joint: No Joint: No Bone: No Bone:  No Epithelialization: None None N/A Periwound Skin Texture: Callus: Yes Callus: Yes N/A Excoriation: No Excoriation: No Induration: No Induration: No Crepitus: No Crepitus: No Rash: No Rash: No Scarring: No Scarring: No Periwound Skin Maceration: No Maceration: No N/A Moisture: Dry/Scaly: No Dry/Scaly: No Periwound Skin Color: Atrophie Blanche: No Atrophie Blanche: No N/A Cyanosis: No Cyanosis: No Ecchymosis: No Ecchymosis: No Erythema: No Erythema: No Hemosiderin Staining: No Hemosiderin Staining: No Mottled: No Mottled: No Pallor: No Pallor: No Rubor: No Rubor: No Temperature: No Abnormality No Abnormality N/A Tenderness on Yes Yes N/A Palpation: Wound Preparation: Ulcer Cleansing: Ulcer Cleansing: N/A Rinsed/Irrigated with Rinsed/Irrigated with Saline Saline Topical Anesthetic Topical Anesthetic Applied: Other: lidocaine Applied: Other: lidocaine 4% 4% Treatment Notes Electronic Signature(s) Signed: 10/10/2016 4:15:16 PM By: [name] BSN, RN Entered By: [name] on 10/10/2016 15:00:43 AUGUSTO, DECKMAN ([id number]) -------------------------------------------------------------------------------- [place] Details Patient Name: Jeremy Gang D. Date of Service: 10/10/2016 2:45 PM Medical Record Number: [id number] Patient Account Number: [id number] Date of Birth/Sex: May 15, 1940 (76 y.o. Male) Treating RN: Jeremy Gouty, RN, BSN, [place] Primary Care Ilo Beamon: [name] Other Clinician: Referring Connor Foxworthy: [name] Treating Erie Sica/Extender: [name] Weeks in Treatment: 2 Active Inactive ` Abuse / Safety / Falls / Self Care Management Nursing Diagnoses: Potential for falls Goals: Patient will not experience any injury related to falls Date Initiated: 09/26/2016 Target Resolution Date: 01/10/2017 Goal Status: Active Interventions: Assess: immobility, friction, shearing, incontinence upon admission and as needed Assess  impairment of mobility on admission and as needed per policy Notes: ` Nutrition Nursing Diagnoses: Imbalanced nutrition Potential for alteratiion in Nutrition/Potential for imbalanced nutrition Goals: Patient/caregiver agrees to and verbalizes understanding of need to use nutritional supplements and/or vitamins as prescribed Date Initiated: 09/26/2016 Target Resolution Date: 01/10/2017 Goal Status: Active Interventions: Assess patient nutrition upon admission and as needed per policy Notes: ` Orientation to the Wound Care Program STARLIN, STEIB ([id number]) Nursing Diagnoses: Knowledge deficit related to the wound healing center program Goals: Patient/caregiver will verbalize understanding of the [facility] Date Initiated: 09/26/2016 Target Resolution Date: 10/11/2016 Goal Status: Active Interventions: Provide education on orientation to the wound center Notes: ` Pain, Acute or Chronic Nursing Diagnoses: Pain, acute or chronic: actual or potential Potential alteration in comfort, pain Goals: Patient/caregiver will verbalize adequate pain control between visits Date Initiated: 09/26/2016 Target Resolution Date: 01/10/2017 Goal Status: Active Interventions: Complete pain assessment as per visit requirements Notes: ` Pressure Nursing Diagnoses: Knowledge deficit related to causes and risk factors for pressure ulcer development Knowledge deficit related to management of pressures ulcers Potential for impaired tissue integrity related to pressure, friction, moisture, and shear Goals: Patient will remain free from development of additional pressure ulcers Date Initiated: 09/26/2016 Target Resolution Date: 01/10/2017 Goal Status: Active Interventions: Assess: immobility, friction, shearing, incontinence upon admission and  as needed Assess potential for pressure ulcer upon admission and as needed LINKON, SIVERSON. ([id number]) Provide education on pressure  ulcers Notes: ` Wound/Skin Impairment Nursing Diagnoses: Impaired tissue integrity Knowledge deficit related to ulceration/compromised skin integrity Goals: Ulcer/skin breakdown will have a volume reduction of 80% by week 12 Date Initiated: 09/26/2016 Target Resolution Date: 01/03/2017 Goal Status: Active Interventions: Assess patient/caregiver ability to perform ulcer/skin care regimen upon admission and as needed Assess ulceration(s) every visit Notes: Electronic Signature(s) Signed: 10/10/2016 4:15:16 PM By: [name] BSN, RN Entered By: [name] on 10/10/2016 15:00:32 DARVIS, CROFT D. ([id number]) -------------------------------------------------------------------------------- Pain Assessment Details Patient Name: Jeremy Gang D. Date of Service: 10/10/2016 2:45 PM Medical Record Number: [id number] Patient Account Number: [id number] Date of Birth/Sex: May 03, 1940 (76 y.o. Male) Treating RN: Jeremy Gouty, RN, BSN, Velva Harman Primary Care Lindaann Gradilla: [name] Other Clinician: Referring Rubert Frediani: [name] Treating Kenyah Luba/Extender: [name] Weeks in Treatment: 2 Active Problems Location of Pain Severity and Description of Pain Patient Has Paino No Site Locations With Dressing Change: No Pain Management and Medication Current Pain Management: Electronic Signature(s) Signed: 10/10/2016 4:15:16 PM By: [name] BSN, RN Entered By: [name] on 10/10/2016 14:50:34 Lizaola, Charlett Lango ([id number]) -------------------------------------------------------------------------------- Patient/Caregiver Education Details Patient Name: Jeremy Gang D. Date of Service: 10/10/2016 2:45 PM Medical Record Number: [id number] Patient Account Number: [id number] Date of Birth/Gender: Apr 02, 1941 (76 y.o. Male) Treating RN: Jeremy Gouty, RN, BSN, Velva Harman Primary Care Physician: [name] Other Clinician: Referring Physician: [name] Treating Physician/Extender: [name] Weeks in  Treatment: 2 Education Assessment Education Provided To: Patient Education Topics Provided Pressure: Methods: Explain/Verbal Responses: State content correctly Welcome To [facility]: Methods: Explain/Verbal Responses: State content correctly Wound Debridement: Methods: Explain/Verbal Responses: State content correctly Wound/Skin Impairment: Methods: Explain/Verbal Responses: State content correctly Electronic Signature(s) Signed: 10/10/2016 4:15:16 PM By: [name] BSN, RN Entered By: [name] on 10/10/2016 15:15:51 Godwin, Karel D. ([id number]) -------------------------------------------------------------------------------- Wound Assessment Details Patient Name: Jeremy Gang D. Date of Service: 10/10/2016 2:45 PM Medical Record Number: [id number] Patient Account Number: [id number] Date of Birth/Sex: [birth year] (76 y.o. Male) Treating RN: Jeremy Gouty, RN, BSN, [place] Primary Care Byron Peacock: [name] Other Clinician: Referring Krissia Schreier: [name] Treating Haruto Demaria/Extender: [name] Weeks in Treatment: 2 Wound Status Wound Number: 2 Primary Pressure Ulcer Etiology: Wound Location: Left Calcaneus Wound Open Wounding Event: Pressure Injury Status: Date Acquired: 05/29/2016 Comorbid Anemia, Chronic Obstructive Pulmonary Weeks Of Treatment: 2 History: Disease (COPD), Arrhythmia, Clustered Wound: No Hypertension, Dementia, Seizure Disorder Photos Photo Uploaded By: [name] on 10/10/2016 16:21:33 Wound Measurements Length: (cm) 0.8 Width: (cm) 1.8 Depth: (cm) 0.4 Area: (cm) 1.131 Volume: (cm) 0.452 % Reduction in Area: 26.2% % Reduction in Volume: 1.5% Epithelialization: None Tunneling: No Undermining: No Wound Description Classification: Category/Stage II Foul Odor Aft Wound Margin: Distinct, outline attached Slough/Fibrin Exudate Amount: Large Exudate Type: Serosanguineous Exudate Color: red, brown er Cleansing: No o Yes Wound  Bed Granulation Amount: Large (67-100%) Exposed Structure Granulation Quality: Red, Pink Fascia Exposed: No Necrotic Amount: Small (1-33%) Fat Layer (Subcutaneous Tissue) Exposed: Yes Maille, Tycen D. ([id number]) Necrotic Quality: Adherent Slough Tendon Exposed: No Muscle Exposed: No Joint Exposed: No Bone Exposed: No Periwound Skin Texture Texture Color No Abnormalities Noted: No No Abnormalities Noted: No Callus: Yes Atrophie Blanche: No Crepitus: No Cyanosis: No Excoriation: No Ecchymosis: No Induration: No Erythema: No Rash: No Hemosiderin Staining: No Scarring: No Mottled: No Pallor: No Moisture Rubor: No No Abnormalities Noted: No Dry / Scaly: No Temperature /  Pain Maceration: No Temperature: No Abnormality Tenderness on Palpation: Yes Wound Preparation Ulcer Cleansing: Rinsed/Irrigated with Saline Topical Anesthetic Applied: Other: lidocaine 4%, Treatment Notes Wound #2 (Left Calcaneus) 1. Cleansed with: Clean wound with Normal Saline 2. Anesthetic Topical Lidocaine 4% cream to wound bed prior to debridement 4. Dressing Applied: Santyl Ointment 5. Secondary Dressing Applied Dry Gauze Foam Kerlix/Conform 7. Secured with Tape Notes heel cup, stretch netting Electronic Signature(s) Signed: 10/10/2016 4:15:16 PM By: [name] BSN, RN Entered By: [name] on 10/10/2016 14:59:42 Bas, Laiken D. ([id number]) -------------------------------------------------------------------------------- Wound Assessment Details Patient Name: Jeremy Gang D. Date of Service: 10/10/2016 2:45 PM Medical Record Number: [id number] Patient Account Number: [id number] Date of Birth/Sex: [birth year] (76 y.o. Male) Treating RN: Jeremy Gouty, RN, BSN, [place] Primary Care Robecca Fulgham: [name] Other Clinician: Referring Jarod Bozzo: [name] Treating Gwendolin Briel/Extender: [name] Weeks in Treatment: 2 Wound Status Wound Number: 3 Primary Trauma, Other Etiology: Wound  Location: Left Foot - Plantar Wound Open Wounding Event: Gradually Appeared Status: Date Acquired: 09/12/2016 Comorbid Anemia, Chronic Obstructive Pulmonary Weeks Of Treatment: 2 History: Disease (COPD), Arrhythmia, Clustered Wound: No Hypertension, Dementia, Seizure Pending Amputation On Presentation Disorder Photos Photo Uploaded By: [name] on 10/10/2016 16:21:51 Wound Measurements Length: (cm) 1.7 Width: (cm) 0.5 Depth: (cm) 0.3 Area: (cm) 0.668 Volume: (cm) 0.2 % Reduction in Area: 57.5% % Reduction in Volume: -27.4% Epithelialization: None Tunneling: No Undermining: No Wound Description Classification: Partial Thickness Foul Odor Aft Wound Margin: Distinct, outline attached Slough/Fibrin Exudate Amount: Large Exudate Type: Serosanguineous Exudate Color: red, brown er Cleansing: No o Yes Wound Bed Granulation Amount: None Present (0%) Exposed Structure Necrotic Amount: Large (67-100%) Fascia Exposed: No Necrotic Quality: Adherent Slough Fat Layer (Subcutaneous Tissue) Exposed: Yes Tarkowski, Ismael D. ([id number]) Tendon Exposed: No Muscle Exposed: No Joint Exposed: No Bone Exposed: No Periwound Skin Texture Texture Color No Abnormalities Noted: No No Abnormalities Noted: No Callus: Yes Atrophie Blanche: No Crepitus: No Cyanosis: No Excoriation: No Ecchymosis: No Induration: No Erythema: No Rash: No Hemosiderin Staining: No Scarring: No Mottled: No Pallor: No Moisture Rubor: No No Abnormalities Noted: No Dry / Scaly: No Temperature / Pain Maceration: No Temperature: No Abnormality Tenderness on Palpation: Yes Wound Preparation Ulcer Cleansing: Rinsed/Irrigated with Saline Topical Anesthetic Applied: Other: lidocaine 4%, Treatment Notes Wound #3 (Left, Plantar Foot) 1. Cleansed with: Clean wound with Normal Saline 2. Anesthetic Topical Lidocaine 4% cream to wound bed prior to debridement 4. Dressing Applied: Santyl Ointment 5.  Secondary Dressing Applied Dry Gauze Foam Kerlix/Conform 7. Secured with Tape Notes heel cup, stretch netting Electronic Signature(s) Signed: 10/10/2016 4:15:16 PM By: [name] BSN, RN Entered By: [name] on 10/10/2016 15:00:11 MANASES, ETCHISON D. ([id number]) -------------------------------------------------------------------------------- Vitals Details Patient Name: Jeremy Gang D. Date of Service: 10/10/2016 2:45 PM Medical Record Number: [id number] Patient Account Number: [id number] Date of Birth/Sex: 1941/01/03 (76 y.o. Male) Treating RN: Afful, RN, BSN, [place] Primary Care Jurnei Latini: [name] Other Clinician: Referring Brysan Mcevoy: [name] Treating Kryssa Risenhoover/Extender: [name] Weeks in Treatment: 2 Vital Signs Time Taken: 14:51 Temperature (F): 98.1 Weight (lbs): 123.8 Pulse (bpm): 55 Respiratory Rate (breaths/min): 17 Blood Pressure (mmHg): 157/48 Reference Range: 80 - 120 mg / dl Electronic Signature(s) Signed: 10/10/2016 4:15:16 PM By: [name] BSN, RN Entered By: [name] on 10/10/2016 14:53:04

## 2016-10-13 MED ORDER — FUROSEMIDE 40 MG PO TABS: 40.0000 mg | ORAL_TABLET | Freq: Two times a day (BID) | ORAL | 0 refills | Status: DC

## 2016-10-13 NOTE — Telephone Encounter (Signed)
Please advise if patient should be taking lasix once daily or twice daily?

## 2016-10-13 NOTE — Telephone Encounter (Signed)
Cardiologist increase him to 40 mg by mouth twice daily. Lets see if he can come in later this week to follow up edema and labs  If he is completely out of medication may refill and make appt

## 2016-10-13 NOTE — Addendum Note (Signed)
Addended by: Logan Bores on: 10/13/2016 12:27 PM   Modules accepted: Orders

## 2016-10-13 NOTE — Telephone Encounter (Signed)
Spoke with Hollace Hayward, scheduled appointment for Wednesday @ 10:45 am.

## 2016-10-13 NOTE — Telephone Encounter (Signed)
I called [facility] on [address] and cancelled rx for Lasix 40 mg once daily. Sent new rx for 40 mg twice daily

## 2016-10-13 NOTE — Telephone Encounter (Signed)
Left message on voicemail for patient to return call when available   

## 2016-10-15 ENCOUNTER — Ambulatory Visit (INDEPENDENT_AMBULATORY_CARE_PROVIDER_SITE_OTHER): Payer: Medicare Other | Admitting: Nurse Practitioner

## 2016-10-15 ENCOUNTER — Encounter: Payer: Self-pay | Admitting: Nurse Practitioner

## 2016-10-15 VITALS — BP 128/76 | HR 68 | Temp 97.6°F | Resp 17 | Ht 66.0 in | Wt 121.6 lb

## 2016-10-15 DIAGNOSIS — L89620 Pressure ulcer of left heel, unstageable: Secondary | ICD-10-CM | POA: Diagnosis not present

## 2016-10-15 DIAGNOSIS — F0391 Unspecified dementia with behavioral disturbance: Secondary | ICD-10-CM | POA: Diagnosis not present

## 2016-10-15 DIAGNOSIS — R634 Abnormal weight loss: Secondary | ICD-10-CM | POA: Diagnosis not present

## 2016-10-15 DIAGNOSIS — R600 Localized edema: Secondary | ICD-10-CM

## 2016-10-15 LAB — BASIC METABOLIC PANEL WITH GFR
BUN: 43 mg/dL — ABNORMAL HIGH (ref 7–25)
CHLORIDE: 106 mmol/L (ref 98–110)
CO2: 22 mmol/L (ref 20–31)
CREATININE: 1.35 mg/dL — AB (ref 0.70–1.18)
Calcium: 9.2 mg/dL (ref 8.6–10.3)
GFR, EST AFRICAN AMERICAN: 58 mL/min — AB (ref >=60)
GFR, Est Non African American: 50 mL/min — ABNORMAL LOW (ref >=60)
Glucose, Bld: 96 mg/dL (ref 65–99)
POTASSIUM: 5.1 mmol/L (ref 3.5–5.3)
SODIUM: 140 mmol/L (ref 135–146)

## 2016-10-15 MED ORDER — DONEPEZIL HCL 10 MG PO TABS: 10.0000 mg | ORAL_TABLET | Freq: Every day | ORAL | 3 refills | Status: DC

## 2016-10-15 MED ORDER — FUROSEMIDE 40 MG PO TABS: 40.0000 mg | ORAL_TABLET | Freq: Every day | ORAL | 3 refills | Status: DC

## 2016-10-15 NOTE — Patient Instructions (Addendum)
Decrease lasix to 40 mg daily Notify for increase swelling or fluid retention  Increase Aricept (donepezil) to 10 mg daily with next refill

## 2016-10-15 NOTE — Progress Notes (Signed)
Careteam: Patient Care Team: [name], NP as PCP - General (Nurse Practitioner)  Advanced Directive information Does Patient Have a Medical Advance Directive?: No  Allergies  Allergen Reactions  . Diltiazem Rash and Other (See Comments)    Blisters  . Penicillins Other (See Comments)    Unknown Childhood allergy  Has patient had a PCN reaction causing immediate rash, facial/tongue/throat swelling, SOB or lightheadedness with hypotension: unknown Has patient had a PCN reaction causing severe rash involving mucus membranes or skin necrosis: unknown Has patient had a PCN reaction that required hospitalization unknown Has patient had a PCN reaction occurring within the last 10 years:no If all of the above answers are "NO", then may proceed with Cephalosporin use.     Chief Complaint  Patient presents with  . Follow-up    Follow up on edema and labs. Pt reports swelling has gotten better.   Evette Doffing, in room with pt     HPI: Patient is a 76 y.o. male seen in the office today to follow up edema. Question has to what dose of lasix he should be taking. This was increased by cardiologist due to bilateral LE edema. He was previously taking 20 mg daily and then increased to 40 mg BID. Edema has gone down.  conts to go to wound care for left heel, healing good per caregiver Drinking muscle milk and boost to help with his protein  Eating a lot of little meals only if his caregiver or someone brings it to him.  Eating sugary sweet snacks and then wont eat his meal.   Review of Systems:  Review of Systems  Unable to perform ROS: Dementia    Past Medical History:  Diagnosis Date  . Alcohol abuse   . Anemia   . Aneurysm ([place])   . CKD (chronic kidney disease), stage III   . COPD (chronic obstructive pulmonary disease) ([place])   . Dysrhythmia   . Fever of unknown origin 08/2016  . History of atrial fibrillation   . Hyperchloremia   . Hypercholesteremia    . Hyperpotassemia   . Hypertension   . Hypertensive renal disease, benign   . Leg edema   . PAD (peripheral artery disease) ([place])   . Pneumonia   . Pressure ulcer of left heel   . Seizures ([place])    has not had a seizure in 15 yrs  . Tobacco use   . Weight loss    Past Surgical History:  Procedure Laterality Date  . ABDOMINAL AORTOGRAM W/LOWER EXTREMITY N/A 08/13/2016   Procedure: Abdominal Aortogram w/Lower Extremity;  Surgeon: Wellington Hampshire, MD;  Location: [facility];  Service: Cardiovascular;  Laterality: N/A;  . 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, 1996  . EYE SURGERY     December 2017  . PERIPHERAL VASCULAR BALLOON ANGIOPLASTY  08/13/2016   Procedure: Peripheral Vascular Balloon Angioplasty;  Surgeon: Wellington Hampshire, MD;  Location: [facility];  Service: Cardiovascular;;  Aborted   Social History:   reports that he has been smoking Cigarettes.  He has a 14.00 pack-year smoking history. He has never used smokeless tobacco. He reports that he does not drink alcohol or use drugs.  Family History  Problem Relation Age of Onset  . Cancer Mother   . Congestive Heart Failure Father   . Alzheimer's disease Sister   . Congestive Heart Failure Sister   . Clotting disorder Sister   .  Congestive Heart Failure Brother   . Clotting disorder Sister     Medications: Patient's Medications  New Prescriptions   No medications on file  Previous Medications   ASPIRIN EC 81 MG TABLET    Take 1 tablet (81 mg total) by mouth daily.   ATORVASTATIN (LIPITOR) 10 MG TABLET    TAKE 1 TABLET BY MOUTH EVERY DAY   CARVEDILOL (COREG) 6.25 MG TABLET    TAKE 1 TABLET (6.25 MG TOTAL) BY MOUTH 2 (TWO) TIMES DAILY WITH A MEAL.   [facility] B-1 100 MG TABLET    TAKE 1 TABLET BY MOUTH EVERY DAY   DIVALPROEX (DEPAKOTE) 250 MG DR TABLET    TAKE 1 TABLET BY MOUTH 3 TIMES A DAY FOR SEIZURES   DONEPEZIL (ARICEPT) 5 MG TABLET    Take 1 tablet (5 mg total) by mouth at bedtime.   FUROSEMIDE (LASIX) 40  MG TABLET    Take 1 tablet (40 mg total) by mouth 2 (two) times daily.   GUAIFENESIN (ROBITUSSIN) 100 MG/5ML LIQUID    Take 10 mg by mouth 3 (three) times daily as needed for cough.   LEVETIRACETAM (KEPPRA) 500 MG TABLET    Take one tablet by mouth twice daily   MULTIPLE VITAMINS-MINERALS (THEREMS-M) TABS    Take 1 tablet by mouth daily.  Modified Medications   No medications on file  Discontinued Medications   No medications on file     Physical Exam:  Vitals:   10/15/16 1046  BP: 128/76  Pulse: 68  Resp: 17  Temp: 97.6 F (36.4 C)  TempSrc: Oral  SpO2: 96%  Weight: 121 lb 9.6 oz (55.2 kg)  Height: '5\' 6"'  (1.676 m)   Body mass index is 19.63 kg/m.  Physical Exam  Constitutional: No distress.  Frail thin male NAD.   HENT:  Mouth/Throat: Oropharynx is clear and moist. No oropharyngeal exudate.  Cardiovascular: Normal rate, regular rhythm and normal heart sounds.   Pulmonary/Chest: Effort normal and breath sounds normal. No respiratory distress.  Abdominal: Soft. Bowel sounds are normal.  Musculoskeletal: Normal range of motion. He exhibits no edema.  Neurological: He is alert.  Skin: Skin is warm. No rash noted.  dressing CDI to left foot wounds  Psychiatric: His affect is labile. Cognition and memory are impaired. He exhibits abnormal recent memory.    Labs reviewed: Basic Metabolic Panel:  Recent Labs  04/14/16 1612  08/18/16 1348 08/19/16 0454 08/19/16 1623 08/20/16 0417  NA 140  < > 138 140  --  140  K 3.8  < > 5.2* 4.6  --  4.2  CL 103  < > 104 110  --  112*  CO2 26  < > 22 22  --  21*  GLUCOSE 156*  < > 143* 102*  --  101*  BUN 15  < > 41* 34*  --  27*  CREATININE 1.41*  < > 1.83* 1.29*  --  1.13  CALCIUM 9.3  < > 8.4* 8.2*  --  8.1*  MG 1.9  --   --   --   --   --   TSH 1.992  --   --   --  3.541  --   < > = values in this interval not displayed. Liver Function Tests:  Recent Labs  06/19/16 1051 08/18/16 1348 08/21/16 1144  AST '13 29 25    ' ALT 6* 22 20  ALKPHOS 64 55 56  BILITOT 0.3 0.5 0.5  PROT  6.7 6.8 6.6  ALBUMIN 3.3* 2.9* 2.3*   No results for input(s): LIPASE, AMYLASE in the last 8760 hours. No results for input(s): AMMONIA in the last 8760 hours. CBC:  Recent Labs  08/18/16 1348  08/23/16 0409 09/18/16 0920 09/23/16 1200  WBC 13.1*  < > 11.3* 5.8 4.9  NEUTROABS 10.1*  --   --  1.9 2.0  HGB 7.9*  < > 7.7* 9.2* 9.6*  HCT 24.1*  < > 24.3* 28.7* 29.7*  MCV 87.0  < > 85.6 84.7 84.8  PLT 189  < > 269 193 180  < > = values in this interval not displayed. Lipid Panel:  Recent Labs  04/15/16 0211  CHOL 164  HDL 103  LDLCALC 51  TRIG 48  CHOLHDL 1.6   TSH:  Recent Labs  04/14/16 1612 08/19/16 1623  TSH 1.992 3.541   A1C: Lab Results  Component Value Date   HGBA1C 5.2 04/15/2016     Assessment/Plan 1. Dementia with behavioral disturbance, unspecified dementia type Doing well on aricept. Will increase to 10 mg with refill.  - donepezil (ARICEPT) 10 MG tablet; Take 1 tablet (10 mg total) by mouth at bedtime.  Dispense: 30 tablet; Refill: 3  2. Decubitus ulcer of left heel, unstageable ([place]) Following with wound care, stressed importance of , proper nutrition and increase in protein as well as off loading.   3. Leg edema Improved, will decrease lasix to 40 mg daily, to notify if increase edema or fluid retention.  - BMP with eGFR - furosemide (LASIX) 40 MG tablet; Take 1 tablet (40 mg total) by mouth daily.  Dispense: 30 tablet; Refill: 3  4. Loss of weight Ongoing weight loss, discussed eating proper meals and snacks/sweets afterwards. Cont protein supplements.   To keep follow up [place]. Harle Battiest  [facility] [phone] 8 am - 5 pm) [phone] (after hours)

## 2016-10-17 ENCOUNTER — Ambulatory Visit (INDEPENDENT_AMBULATORY_CARE_PROVIDER_SITE_OTHER)
Admission: RE | Admit: 2016-10-17 | Discharge: 2016-10-17 | Disposition: A | Discharge status: Home / Self Care | Payer: Medicare Other | Source: Ambulatory Visit | Attending: Pulmonary Disease | Admitting: Pulmonary Disease

## 2016-10-17 ENCOUNTER — Ambulatory Visit: Payer: Medicare Other | Admitting: Physician Assistant

## 2016-10-17 DIAGNOSIS — R918 Other nonspecific abnormal finding of lung field: Secondary | ICD-10-CM

## 2016-10-21 ENCOUNTER — Encounter: Payer: Self-pay | Admitting: Pulmonary Disease

## 2016-10-21 ENCOUNTER — Ambulatory Visit (INDEPENDENT_AMBULATORY_CARE_PROVIDER_SITE_OTHER): Payer: Medicare Other | Admitting: Pulmonary Disease

## 2016-10-21 VITALS — BP 112/56 | HR 52 | Ht 66.0 in | Wt 119.8 lb

## 2016-10-21 DIAGNOSIS — F17210 Nicotine dependence, cigarettes, uncomplicated: Secondary | ICD-10-CM | POA: Diagnosis not present

## 2016-10-21 DIAGNOSIS — R0602 Shortness of breath: Secondary | ICD-10-CM | POA: Diagnosis not present

## 2016-10-21 DIAGNOSIS — J439 Emphysema, unspecified: Secondary | ICD-10-CM | POA: Diagnosis not present

## 2016-10-21 NOTE — Patient Instructions (Signed)
We will schedule you for pulmonary function tests Return to clinic in 3 months. Continue working on smoking cessation.

## 2016-10-21 NOTE — Progress Notes (Signed)
[facility]    [id number]    [birth year]  Primary Care Physician:Eubanks, Carlos American, NP  Referring Physician: [name], NP [place], [place] [address]  Chief complaint:   Follow-up for left lower lobe consolidation  HPI: 76 year old with history of PVD, COPD, hypertension, hyperlipidemia, active tobacco and alcohol use, seizure disorder, brain aneurysm.   He was admitted to hospital on 08/18/16 with cough, fever, rash, confusion, elevated LA, AKI. He was treated for LLL pneumonia with vanco, cefepime initially, This has been changed to oral vantin on 5/16. 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  During the admisiona  CT abdomen pelvis and a CT chest which showed left lower lobe consolidation with narrowing of lower lobe atelectasis, mediastinal adenopathy. PCCM was consulted for further management and recommended to treat PNA and repeat CT scan. He returns to the clinic today after having completed antibiotic course. His repeat CT scan shows resolution of the abnormalities. He feels well with no cough, sputum production, fevers, chills. He has mild dyspnea on exertion and is not on any inhalers.   Outpatient Encounter Prescriptions as of 10/21/2016  Medication Sig  . aspirin EC 81 MG tablet Take 1 tablet (81 mg total) by mouth daily.  Marland Kitchen atorvastatin (LIPITOR) 10 MG tablet TAKE 1 TABLET BY MOUTH EVERY DAY  . carvedilol (COREG) 6.25 MG tablet TAKE 1 TABLET (6.25 MG TOTAL) BY MOUTH 2 (TWO) TIMES DAILY WITH A MEAL.  . CVS B-1 100 MG tablet TAKE 1 TABLET BY MOUTH EVERY DAY  . divalproex (DEPAKOTE) 250 MG DR tablet TAKE 1 TABLET BY MOUTH 3 TIMES A DAY FOR SEIZURES  . donepezil (ARICEPT) 10 MG tablet Take 1 tablet (10 mg total) by mouth at bedtime.  . furosemide (LASIX) 40 MG tablet Take 1 tablet (40 mg total) by mouth daily.  Marland Kitchen guaiFENesin (ROBITUSSIN) 100 MG/5ML liquid Take 10  mg by mouth 3 (three) times daily as needed for cough.  . levETIRAcetam (KEPPRA) 500 MG tablet Take one tablet by mouth twice daily  . Multiple Vitamins-Minerals (THEREMS-M) TABS Take 1 tablet by mouth daily.   No facility-administered encounter medications on file as of 10/21/2016.     Allergies as of 10/21/2016 - Review Complete 10/21/2016  Allergen Reaction Noted  . Diltiazem Rash and Other (See Comments) 05/08/2016  . Penicillins Other (See Comments) 07/28/2012    Past Medical History:  Diagnosis Date  . Alcohol abuse   . Anemia   . Aneurysm ([place])   . CKD (chronic kidney disease), stage III   . COPD (chronic obstructive pulmonary disease) ([place])   . Dysrhythmia   . Fever of unknown origin 08/2016  . History of atrial fibrillation   . Hyperchloremia   . Hypercholesteremia   . Hyperpotassemia   . Hypertension   . Hypertensive renal disease, benign   . Leg edema   . PAD (peripheral artery disease) ([place])   . Pneumonia   . Pressure ulcer of left heel   . Seizures ([place])    has not had a seizure in 15 yrs  . Tobacco use   . Weight loss     Past Surgical History:  Procedure Laterality Date  . ABDOMINAL AORTOGRAM W/LOWER EXTREMITY N/A 08/13/2016   Procedure: Abdominal Aortogram w/Lower Extremity;  Surgeon: Wellington Hampshire, MD;  Location: [facility];  Service: Cardiovascular;  Laterality: N/A;  . 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, 1996  . EYE SURGERY     December 2017  . PERIPHERAL VASCULAR BALLOON ANGIOPLASTY  08/13/2016   Procedure: Peripheral Vascular Balloon Angioplasty;  Surgeon: Wellington Hampshire, MD;  Location: [facility];  Service: Cardiovascular;;  Aborted    Family History  Problem Relation Age of Onset  . Cancer Mother   . Congestive Heart Failure Father   . Alzheimer's disease Sister   . Congestive Heart Failure Sister   . Clotting disorder Sister   . Congestive Heart Failure Brother   . Clotting disorder Sister     Social History    Social History  . Marital status: Single    Spouse name: N/A  . Number of children: N/A  . Years of education: N/A   Occupational History  . Not on file.   Social History Main Topics  . Smoking status: Current Every Day Smoker    Packs/day: 0.25    Years: 56.00    Types: Cigarettes  . Smokeless tobacco: Never Used     Comment: On average about 6 cig daily   . Alcohol use No     Comment: QUIT DRINKING  04/2016  . Drug use: No  . Sexual activity: Not Currently   Other Topics Concern  . Not on file   Social History Narrative  . No narrative on file    Review of systems: Review of Systems  Constitutional: Negative for fever and chills.  HENT: Negative.   Eyes: Negative for blurred vision.  Respiratory: as per HPI  Cardiovascular: Negative for chest pain and palpitations.  Gastrointestinal: Negative for vomiting, diarrhea, blood per rectum. Genitourinary: Negative for dysuria, urgency, frequency and hematuria.  Musculoskeletal: Negative for myalgias, back pain and joint pain.  Skin: Negative for itching and rash.  Neurological: Negative for dizziness, tremors, focal weakness, seizures and loss of consciousness.  Endo/Heme/Allergies: Negative for environmental allergies.  Psychiatric/Behavioral: Negative for depression, suicidal ideas and hallucinations.  All other systems reviewed and are negative.  Physical Exam: Blood pressure (!) 112/56, pulse (!) 52, height '5\' 6"'  (1.676 m), weight 119 lb 12.8 oz (54.3 kg), SpO2 100 %. Gen:      No acute distress HEENT:  EOMI, sclera anicteric Neck:     No masses; no thyromegaly Lungs:    Clear to auscultation bilaterally; normal respiratory effort CV:         Regular rate and rhythm; no murmurs Abd:      + bowel sounds; soft, non-tender; no palpable masses, no distension Ext:    No edema; adequate peripheral perfusion Skin:      Warm and dry; no rash Neuro: alert and oriented x 3 Psych: normal mood and affect  Data  Reviewed: CT abdomen pelvis 08/20/16- left lower lobe consolidation with small effusion. CT chest 08/22/16- ill-defined left perihilar soft tissue density with bronchial narrowing in posterobasal segments, left lower lobe consolidation. CT chest scan 10/17/16- resolution of left lower lobe consolidation, pleural effusion. Aortic and coronary atherosclerosis. Mild centrilobular and paraseptal emphysema. I have reviewed images personally.  Assessment:  Follow up for LLL pneumonia He has finished his antibiotic course. A follow-up CT shows resolution of the pneumonia with no residual lung infiltrate, mass, nodule.    Emphysema He does not need inhalers at present and we will continue to observe him off treatment. Will schedule PFTs for further evaluation.  Active smoker Discussed need to stop smoking give his PVD and  emphysema. Time spent counseling- 5 mins  Plan/Recommendations: - PFTs - Smoking cessation.   Marshell Garfinkel MD [facility] Pager [phone] 10/21/2016, 4:41 PM  CC: [name], NP

## 2016-10-22 ENCOUNTER — Other Ambulatory Visit (HOSPITAL_BASED_OUTPATIENT_CLINIC_OR_DEPARTMENT_OTHER): Payer: Medicare Other

## 2016-10-22 DIAGNOSIS — D649 Anemia, unspecified: Secondary | ICD-10-CM | POA: Diagnosis not present

## 2016-10-22 LAB — CBC WITH DIFFERENTIAL/PLATELET
BASO%: 0.7 % (ref 0.0–2.0)
Basophils Absolute: 0 10*3/uL (ref 0.0–0.1)
EOS ABS: 0.7 10*3/uL — AB (ref 0.0–0.5)
EOS%: 11.3 % — ABNORMAL HIGH (ref 0.0–7.0)
HEMATOCRIT: 34.6 % — AB (ref 38.4–49.9)
HGB: 11.1 g/dL — ABNORMAL LOW (ref 13.0–17.1)
LYMPH%: 50.9 % — ABNORMAL HIGH (ref 14.0–49.0)
MCH: 28.2 pg (ref 27.2–33.4)
MCHC: 32.1 g/dL (ref 32.0–36.0)
MCV: 88 fL (ref 79.3–98.0)
MONO#: 0.6 10*3/uL (ref 0.1–0.9)
MONO%: 9.9 % (ref 0.0–14.0)
NEUT%: 27.2 % — ABNORMAL LOW (ref 39.0–75.0)
NEUTROS ABS: 1.6 10*3/uL (ref 1.5–6.5)
PLATELETS: 150 10*3/uL (ref 140–400)
RBC: 3.93 10*6/uL — ABNORMAL LOW (ref 4.20–5.82)
RDW: 18.8 % — ABNORMAL HIGH (ref 11.0–14.6)
WBC: 5.9 10*3/uL (ref 4.0–10.3)
lymph#: 3 10*3/uL (ref 0.9–3.3)

## 2016-10-24 ENCOUNTER — Encounter: Payer: Medicare Other | Admitting: Surgery

## 2016-10-24 DIAGNOSIS — I70245 Atherosclerosis of native arteries of left leg with ulceration of other part of foot: Secondary | ICD-10-CM | POA: Diagnosis not present

## 2016-10-27 NOTE — Progress Notes (Signed)
AQUILA, DELAUGHTER ([id number]) Visit Report for 10/24/2016 Arrival Information Details Patient Name: [name], [name] Date of Service: 10/24/2016 12:30 PM Medical Record Number: [id number] Patient Account Number: [id number] Date of Birth/Sex: 25-Jun-1940 (76 y.o. Male) Treating RN: Cornell Barman Primary Care Isa Hitz: [name] Other Clinician: Referring Deyci Gesell: [name] Treating Tawny Raspberry/Extender: Frann Rider in Treatment: 4 Visit Information History Since Last Visit Added or deleted any medications: No Patient Arrived: Ambulatory Any new allergies or adverse reactions: No Arrival Time: 12:46 Had a fall or experienced change in No Accompanied By: daughter activities of daily living that may affect Transfer Assistance: None risk of falls: Patient Requires Transmission-Based No Signs or symptoms of abuse/neglect since last No Precautions: visito Patient Has Alerts: No Hospitalized since last visit: No Has Dressing in Place as Prescribed: Yes Pain Present Now: No Electronic Signature(s) Signed: 10/24/2016 4:53:37 PM By: Gretta Cool, BSN, RN, CWS, Kim RN, BSN Entered By: Gretta Cool, BSN, RN, CWS, Kim on 10/24/2016 12:46:28 [name] ([id number]) -------------------------------------------------------------------------------- Encounter Discharge Information Details Patient Name: Jeremy Gang D. Date of Service: 10/24/2016 12:30 PM Medical Record Number: [id number] Patient Account Number: [id number] Date of Birth/Sex: Jul 06, 1940 (76 y.o. Male) Treating RN: Cornell Barman Primary Care Jaquasia Doscher: [name] Other Clinician: Referring Arelly Whittenberg: [name] Treating Edwar Coe/Extender: Frann Rider in Treatment: 4 Encounter Discharge Information Items Discharge Pain Level: 0 Discharge Condition: Stable Ambulatory Status: Ambulatory Discharge Destination: Home Transportation: Private Auto Accompanied By: cousin Schedule Follow-up Appointment: Yes Medication  Reconciliation completed and provided to Patient/Care Yes [place] Hair: Provided on Clinical Summary of Care: 10/24/2016 Form Type Recipient Paper Patient EM Electronic Signature(s) Signed: 10/24/2016 4:53:37 PM By: Gretta Cool, BSN, RN, CWS, Kim RN, BSN Previous Signature: 10/24/2016 1:18:56 PM Version By: Ruthine Dose Entered By: Gretta Cool BSN, RN, CWS, Kim on 10/24/2016 13:22:55 Tijerina, Charlett Lango ([id number]) -------------------------------------------------------------------------------- Lower Extremity Assessment Details Patient Name: [name], [name] Date of Service: 10/24/2016 12:30 PM Medical Record Number: [id number] Patient Account Number: [id number] Date of Birth/Sex: 1940/12/04 (76 y.o. Male) Treating RN: Cornell Barman Primary Care Elliott Quade: [name] Other Clinician: Referring Hokulani Rogel: [name] Treating Brayleigh Rybacki/Extender: Frann Rider in Treatment: 4 Vascular Assessment Pulses: Dorsalis Pedis Palpable: [Left:Yes] Posterior Tibial Extremity colors, hair growth, and conditions: Extremity Color: [Left:Normal] Hair Growth on Extremity: [Left:No] Temperature of Extremity: [Left:Warm] Capillary Refill: [Left:< 3 seconds] Dependent Rubor: [Left:No] Blanched when Elevated: [Left:No] Lipodermatosclerosis: [Left:No] Toe Nail Assessment Left: Right: Thick: No Discolored: No Deformed: No Improper Length and Hygiene: No Electronic Signature(s) Signed: 10/24/2016 4:53:37 PM By: Gretta Cool, BSN, RN, CWS, Kim RN, BSN Entered By: Gretta Cool, BSN, RN, CWS, Kim on 10/24/2016 12:54:04 Estell, Jeremy Medina D. ([id number]) -------------------------------------------------------------------------------- Multi Wound Chart Details Patient Name: Jeremy Gang D. Date of Service: 10/24/2016 12:30 PM Medical Record Number: [id number] Patient Account Number: [id number] Date of Birth/Sex: 12-14-1940 (76 y.o. Male) Treating RN: Cornell Barman Primary Care Jerrye Seebeck: [name] Other Clinician: Referring  Valory Wetherby: [name] Treating Ettamae Barkett/Extender: Frann Rider in Treatment: 4 Vital Signs Height(in): Pulse(bpm): 40 Weight(lbs): 123.8 Blood Pressure 143/53 (mmHg): Body Mass Index(BMI): Temperature(F): 97.9 Respiratory Rate 16 (breaths/min): Photos: [N/A:N/A] Wound Location: Left Calcaneus Left Foot - Plantar N/A Wounding Event: Pressure Injury Gradually Appeared N/A Primary Etiology: Pressure Ulcer Trauma, Other N/A Comorbid History: Anemia, Chronic Anemia, Chronic N/A Obstructive Pulmonary Obstructive Pulmonary Disease (COPD), Disease (COPD), Arrhythmia, Hypertension, Arrhythmia, Hypertension, Dementia, Seizure Dementia, Seizure Disorder Disorder Date Acquired: 05/29/2016 09/12/2016 N/A Weeks of Treatment: 4 4 N/A Wound Status: Open Open N/A Pending Amputation on No Yes N/A Presentation: Measurements  L x W x D 0.5x1.5x0.5 1.5x0.2x1 N/A (cm) Area (cm) : 0.589 0.236 N/A Volume (cm) : 0.295 0.236 N/A % Reduction in Area: 61.60% 85.00% N/A % Reduction in Volume: 35.70% -50.30% N/A Starting Position 1 12 (o'clock): Ending Position 1 12 (o'clock): 0.8 [name], [name] ([id number]) Maximum Distance 1 (cm): Undermining: No Yes N/A Classification: Category/Stage II Partial Thickness N/A Exudate Amount: Large Large N/A Exudate Type: Serosanguineous Serosanguineous N/A Exudate Color: red, brown red, brown N/A Wound Margin: Distinct, outline attached Distinct, outline attached N/A Granulation Amount: Large (67-100%) None Present (0%) N/A Granulation Quality: Red, Pink N/A N/A Necrotic Amount: Small (1-33%) Large (67-100%) N/A Exposed Structures: Fat Layer (Subcutaneous Fat Layer (Subcutaneous N/A Tissue) Exposed: Yes Tissue) Exposed: Yes Fascia: No Fascia: No Tendon: No Tendon: No Muscle: No Muscle: No Joint: No Joint: No Bone: No Bone: No Epithelialization: None None N/A Debridement: Debridement ([id number]- Debridement (11042- N/A 11047)  11047) Pre-procedure 13:00 13:00 N/A Verification/Time Out Taken: Pain Control: Other Other N/A Tissue Debrided: Callus, Subcutaneous Fibrin/Slough, Callus, N/A Subcutaneous Level: Skin/Subcutaneous Skin/Subcutaneous N/A Tissue Tissue Debridement Area (sq 0.75 0.45 N/A cm): Instrument: Curette Curette N/A Bleeding: Minimum Minimum N/A Hemostasis Achieved: Pressure Pressure N/A Procedural Pain: 3 3 N/A Post Procedural Pain: 0 0 N/A Debridement Treatment Procedure was tolerated Procedure was tolerated N/A Response: well well Post Debridement 0.5x1.5x0.5 1.5x0.2x1 N/A Measurements L x W x D (cm) Post Debridement 0.295 0.236 N/A Volume: (cm) Post Debridement Category/Stage II N/A N/A Stage: Periwound Skin Texture: Callus: Yes Callus: Yes N/A Excoriation: No Excoriation: No Induration: No Induration: No Crepitus: No Crepitus: No [name], RANDO. ([id number]) Rash: No Rash: No Scarring: No Scarring: No Periwound Skin Maceration: No Maceration: No N/A Moisture: Dry/Scaly: No Dry/Scaly: No Periwound Skin Color: Atrophie Blanche: No Atrophie Blanche: No N/A Cyanosis: No Cyanosis: No Ecchymosis: No Ecchymosis: No Erythema: No Erythema: No Hemosiderin Staining: No Hemosiderin Staining: No Mottled: No Mottled: No Pallor: No Pallor: No Rubor: No Rubor: No Temperature: No Abnormality No Abnormality N/A Tenderness on Yes Yes N/A Palpation: Wound Preparation: Ulcer Cleansing: Ulcer Cleansing: N/A Rinsed/Irrigated with Rinsed/Irrigated with Saline Saline Topical Anesthetic Topical Anesthetic Applied: Other: lidocaine Applied: Other: lidocaine 4% 4% Procedures Performed: Debridement N/A N/A Treatment Notes Wound #2 (Left Calcaneus) 1. Cleansed with: Clean wound with Normal Saline 2. Anesthetic Topical Lidocaine 4% cream to wound bed prior to debridement 4. Dressing Applied: Aquacel Ag 5. Secondary Dressing Applied Foam ABD and Kerlix/Conform Notes heel  cup Wound #3 (Left, Plantar Foot) 1. Cleansed with: Clean wound with Normal Saline 2. Anesthetic Topical Lidocaine 4% cream to wound bed prior to debridement 4. Dressing Applied: Aquacel Ag 5. Secondary Dressing Applied Foam ABD and Kerlix/Conform [name], CARN. ([id number]) Notes heel cup Electronic Signature(s) Signed: 10/24/2016 1:57:01 PM By: Christin Fudge MD, FACS Entered By: Christin Fudge on 10/24/2016 13:57:00 [name], [name] ([id number]) -------------------------------------------------------------------------------- [place] Details Patient Name: [name], [name] Date of Service: 10/24/2016 12:30 PM Medical Record Number: [id number] Patient Account Number: [id number] Date of Birth/Sex: 01/18/1941 (76 y.o. Male) Treating RN: Cornell Barman Primary Care Amor Hyle: [name] Other Clinician: Referring Vee Bahe: [name] Treating Sarann Tregre/Extender: Frann Rider in Treatment: 4 Active Inactive ` Abuse / Safety / Falls / Self Care Management Nursing Diagnoses: Potential for falls Goals: Patient will not experience any injury related to falls Date Initiated: 09/26/2016 Target Resolution Date: 01/10/2017 Goal Status: Active Interventions: Assess: immobility, friction, shearing, incontinence upon admission and as needed Assess impairment of mobility on admission and as  needed per policy Notes: ` Nutrition Nursing Diagnoses: Imbalanced nutrition Potential for alteratiion in Nutrition/Potential for imbalanced nutrition Goals: Patient/caregiver agrees to and verbalizes understanding of need to use nutritional supplements and/or vitamins as prescribed Date Initiated: 09/26/2016 Target Resolution Date: 01/10/2017 Goal Status: Active Interventions: Assess patient nutrition upon admission and as needed per policy Notes: ` Orientation to the Wound Care Program [name], [name] ([id number]) Nursing Diagnoses: Knowledge deficit related to the  wound healing center program Goals: Patient/caregiver will verbalize understanding of the [facility] Date Initiated: 09/26/2016 Target Resolution Date: 10/11/2016 Goal Status: Active Interventions: Provide education on orientation to the wound center Notes: ` Pain, Acute or Chronic Nursing Diagnoses: Pain, acute or chronic: actual or potential Potential alteration in comfort, pain Goals: Patient/caregiver will verbalize adequate pain control between visits Date Initiated: 09/26/2016 Target Resolution Date: 01/10/2017 Goal Status: Active Interventions: Complete pain assessment as per visit requirements Notes: ` Pressure Nursing Diagnoses: Knowledge deficit related to causes and risk factors for pressure ulcer development Knowledge deficit related to management of pressures ulcers Potential for impaired tissue integrity related to pressure, friction, moisture, and shear Goals: Patient will remain free from development of additional pressure ulcers Date Initiated: 09/26/2016 Target Resolution Date: 01/10/2017 Goal Status: Active Interventions: Assess: immobility, friction, shearing, incontinence upon admission and as needed Assess potential for pressure ulcer upon admission and as needed [name], [name] ([id number]) Provide education on pressure ulcers Notes: ` Wound/Skin Impairment Nursing Diagnoses: Impaired tissue integrity Knowledge deficit related to ulceration/compromised skin integrity Goals: Ulcer/skin breakdown will have a volume reduction of 80% by week 12 Date Initiated: 09/26/2016 Target Resolution Date: 01/03/2017 Goal Status: Active Interventions: Assess patient/caregiver ability to perform ulcer/skin care regimen upon admission and as needed Assess ulceration(s) every visit Notes: Electronic Signature(s) Signed: 10/24/2016 4:53:37 PM By: Gretta Cool, BSN, RN, CWS, Kim RN, BSN Entered By: Gretta Cool, BSN, RN, CWS, Kim on 10/24/2016 13:00:30 [name], [name]  ([id number]) -------------------------------------------------------------------------------- Pain Assessment Details Patient Name: Jeremy Gang D. Date of Service: 10/24/2016 12:30 PM Medical Record Number: [id number] Patient Account Number: [id number] Date of Birth/Sex: 22-Jul-1940 (76 y.o. Male) Treating RN: Cornell Barman Primary Care Takeesha Isley: [name] Other Clinician: Referring Landan Fedie: [name] Treating Mischa Brittingham/Extender: Frann Rider in Treatment: 4 Active Problems Location of Pain Severity and Description of Pain Patient Has Paino No Site Locations With Dressing Change: No Pain Management and Medication Current Pain Management: Goals for Pain Management Topical or injectable lidocaine is offered to patient for acute pain when surgical debridement is performed. If needed, Patient is instructed to use over the counter pain medication for the following 24-48 hours after debridement. Wound care MDs do not prescribed pain medications. Patient has chronic pain or uncontrolled pain. Patient has been instructed to make an appointment with their Primary Care Physician for pain management. Electronic Signature(s) Signed: 10/24/2016 4:53:37 PM By: Gretta Cool, BSN, RN, CWS, Kim RN, BSN Entered By: Gretta Cool, BSN, RN, CWS, Kim on 10/24/2016 12:48:17 [name] ([id number]) -------------------------------------------------------------------------------- Patient/Caregiver Education Details Patient Name: MINH, ROANHORSE D. Date of Service: 10/24/2016 12:30 PM Medical Record Number: [id number] Patient Account Number: [id number] Date of Birth/Gender: 27-Apr-1940 (76 y.o. Male) Treating RN: Cornell Barman Primary Care Physician: [name] Other Clinician: Referring Physician: [name] Treating Physician/Extender: Frann Rider in Treatment: 4 Education Assessment Education Provided To: Patient Education Topics Provided Wound/Skin Impairment: Handouts: Caring for  Your Ulcer Methods: Demonstration Responses: State content correctly Electronic Signature(s) Signed: 10/24/2016 4:53:37 PM By: Gretta Cool, BSN, RN, CWS, Kim RN, BSN  Entered By: Gretta Cool, BSN, RN, CWS, Kim on 10/24/2016 13:23:06 [name], [name] Kitchen ([id number]) -------------------------------------------------------------------------------- Wound Assessment Details Patient Name: ADVAIT, BUICE D. Date of Service: 10/24/2016 12:30 PM Medical Record Number: [id number] Patient Account Number: [id number] Date of Birth/Sex: 1940/07/06 (76 y.o. Male) Treating RN: Cornell Barman Primary Care Moyses Pavey: [name] Other Clinician: Referring Shamon Lobo: [name] Treating Braxden Lovering/Extender: Frann Rider in Treatment: 4 Wound Status Wound Number: 2 Primary Pressure Ulcer Etiology: Wound Location: Left Calcaneus Wound Open Wounding Event: Pressure Injury Status: Date Acquired: 05/29/2016 Comorbid Anemia, Chronic Obstructive Pulmonary Weeks Of Treatment: 4 History: Disease (COPD), Arrhythmia, Clustered Wound: No Hypertension, Dementia, Seizure Disorder Photos Wound Measurements Length: (cm) 0.5 Width: (cm) 1.5 Depth: (cm) 0.5 Area: (cm) 0.589 Volume: (cm) 0.295 % Reduction in Area: 61.6% % Reduction in Volume: 35.7% Epithelialization: None Tunneling: No Undermining: No Wound Description Classification: Category/Stage II Foul Odor Aft Wound Margin: Distinct, outline attached Slough/Fibrin Exudate Amount: Large Exudate Type: Serosanguineous Exudate Color: red, brown er Cleansing: No o Yes Wound Bed Granulation Amount: Large (67-100%) Exposed Structure Granulation Quality: Red, Pink Fascia Exposed: No Necrotic Amount: Small (1-33%) Fat Layer (Subcutaneous Tissue) Exposed: Yes Necrotic Quality: Adherent Slough Tendon Exposed: No Muscle Exposed: No Joint Exposed: No [name], [name] ([id number]) Bone Exposed: No Periwound Skin Texture Texture Color No Abnormalities Noted:  No No Abnormalities Noted: No Callus: Yes Atrophie Blanche: No Crepitus: No Cyanosis: No Excoriation: No Ecchymosis: No Induration: No Erythema: No Rash: No Hemosiderin Staining: No Scarring: No Mottled: No Pallor: No Moisture Rubor: No No Abnormalities Noted: No Dry / Scaly: No Temperature / Pain Maceration: No Temperature: No Abnormality Tenderness on Palpation: Yes Wound Preparation Ulcer Cleansing: Rinsed/Irrigated with Saline Topical Anesthetic Applied: Other: lidocaine 4%, Treatment Notes Wound #2 (Left Calcaneus) 1. Cleansed with: Clean wound with Normal Saline 2. Anesthetic Topical Lidocaine 4% cream to wound bed prior to debridement 4. Dressing Applied: Aquacel Ag 5. Secondary Dressing Applied Foam ABD and Kerlix/Conform Notes heel cup Electronic Signature(s) Signed: 10/24/2016 4:53:37 PM By: Gretta Cool, BSN, RN, CWS, Kim RN, BSN Entered By: Gretta Cool, BSN, RN, CWS, Kim on 10/24/2016 13:01:47 [name] ([id number]) -------------------------------------------------------------------------------- Wound Assessment Details Patient Name: DRUE, HARR D. Date of Service: 10/24/2016 12:30 PM Medical Record Number: [id number] Patient Account Number: [id number] Date of Birth/Sex: [birth year] (76 y.o. Male) Treating RN: Cornell Barman Primary Care Ger Ringenberg: [name] Other Clinician: Referring Alaycia Eardley: [name] Treating Kyreese Chio/Extender: Frann Rider in Treatment: 4 Wound Status Wound Number: 3 Primary Trauma, Other Etiology: Wound Location: Left Foot - Plantar Wound Open Wounding Event: Gradually Appeared Status: Date Acquired: 09/12/2016 Comorbid Anemia, Chronic Obstructive Pulmonary Weeks Of Treatment: 4 History: Disease (COPD), Arrhythmia, Clustered Wound: No Hypertension, Dementia, Seizure Pending Amputation On Presentation Disorder Photos Wound Measurements Length: (cm) 1.5 Width: (cm) 0.2 Depth: (cm) 1 Area: (cm) 0.236 Volume:  (cm) 0.236 % Reduction in Area: 85% % Reduction in Volume: -50.3% Epithelialization: None Tunneling: No Undermining: Yes Starting Position (o'clock): 12 Ending Position (o'clock): 12 Maximum Distance: (cm) 0.8 Wound Description Classification: Partial Thickness Foul Odor Afte Wound Margin: Distinct, outline attached Slough/Fibrino Exudate Amount: Large Exudate Type: Serosanguineous Exudate Color: red, brown r Cleansing: No Yes Wound Bed Granulation Amount: None Present (0%) Exposed Structure Necrotic Amount: Large (67-100%) Fascia Exposed: No Saulter, Majour D. ([id number]) Necrotic Quality: Adherent Slough Fat Layer (Subcutaneous Tissue) Exposed: Yes Tendon Exposed: No Muscle Exposed: No Joint Exposed: No Bone Exposed: No Periwound Skin Texture Texture Color No Abnormalities Noted: No No Abnormalities Noted: No Callus: Yes  Atrophie Blanche: No Crepitus: No Cyanosis: No Excoriation: No Ecchymosis: No Induration: No Erythema: No Rash: No Hemosiderin Staining: No Scarring: No Mottled: No Pallor: No Moisture Rubor: No No Abnormalities Noted: No Dry / Scaly: No Temperature / Pain Maceration: No Temperature: No Abnormality Tenderness on Palpation: Yes Wound Preparation Ulcer Cleansing: Rinsed/Irrigated with Saline Topical Anesthetic Applied: Other: lidocaine 4%, Treatment Notes Wound #3 (Left, Plantar Foot) 1. Cleansed with: Clean wound with Normal Saline 2. Anesthetic Topical Lidocaine 4% cream to wound bed prior to debridement 4. Dressing Applied: Aquacel Ag 5. Secondary Dressing Applied Foam ABD and Kerlix/Conform Notes heel cup Electronic Signature(s) Signed: 10/24/2016 4:53:37 PM By: Gretta Cool, BSN, RN, CWS, Kim RN, BSN Entered By: Gretta Cool, BSN, RN, CWS, Kim on 10/24/2016 13:02:27 [name] ([id number]) -------------------------------------------------------------------------------- Vitals Details Patient Name: Jeremy Gang D. Date of Service:  10/24/2016 12:30 PM Medical Record Number: [id number] Patient Account Number: [id number] Date of Birth/Sex: November 24, 1940 (76 y.o. Male) Treating RN: Cornell Barman Primary Care Winefred Hillesheim: [name] Other Clinician: Referring Terisha Losasso: [name] Treating Denise Bramblett/Extender: Frann Rider in Treatment: 4 Vital Signs Time Taken: 12:48 Temperature (F): 97.9 Weight (lbs): 123.8 Pulse (bpm): 40 Respiratory Rate (breaths/min): 16 Blood Pressure (mmHg): 143/53 Reference Range: 80 - 120 mg / dl Electronic Signature(s) Signed: 10/24/2016 4:53:37 PM By: Gretta Cool, BSN, RN, CWS, Kim RN, BSN Entered By: Gretta Cool, BSN, RN, CWS, Kim on 10/24/2016 12:48:47

## 2016-10-27 NOTE — Progress Notes (Addendum)
[name], Jeremy ([id number]) Visit Report for 10/24/2016 Chief Complaint Document Details Patient Name: [name], [name] Date of Service: 10/24/2016 12:30 PM Medical Record Number: [id number] Patient Account Number: [id number] Date of Birth/Sex: 1940/05/08 (76 y.o. Male) Treating RN: Cornell Barman Primary Care Provider: [name] Other Clinician: Referring Provider: [name] Treating Provider/Extender: Frann Rider in Treatment: 4 Information Obtained from: Patient Chief Complaint Patient is at the clinic for treatment of an open pressure ulcer the left heel and most recently has also developed a ulcer on the plantar aspect of his left foot near the fourth metatarsal head. Electronic Signature(s) Signed: 10/24/2016 1:58:09 PM By: Christin Fudge MD, FACS Entered By: Christin Fudge on 10/24/2016 13:58:09 AMAR, SIPPEL D. ([id number]) -------------------------------------------------------------------------------- Debridement Details Patient Name: Jeremy Gang D. Date of Service: 10/24/2016 12:30 PM Medical Record Number: [id number] Patient Account Number: [id number] Date of Birth/Sex: [birth year] (76 y.o. Male) Treating RN: Cornell Barman Primary Care Provider: [name] Other Clinician: Referring Provider: [name] Treating Provider/Extender: Frann Rider in Treatment: 4 Debridement Performed for Wound #2 Left Calcaneus Assessment: Performed By: Physician Christin Fudge, MD Debridement: Debridement Pre-procedure Verification/Time Out Yes - 13:00 Taken: Start Time: 13:01 Pain Control: Other : lidocaine 4% Level: Skin/Subcutaneous Tissue Total Area Debrided (L x 0.5 (cm) x 1.5 (cm) = 0.75 (cm) W): Tissue and other Viable, Non-Viable, Callus, Subcutaneous material debrided: Instrument: Curette Bleeding: Minimum Hemostasis Achieved: Pressure End Time: 13:04 Procedural Pain: 3 Post Procedural Pain: 0 Response to Treatment: Procedure was tolerated  well Post Debridement Measurements of Total Wound Length: (cm) 0.5 Stage: Category/Stage II Width: (cm) 1.5 Depth: (cm) 0.5 Volume: (cm) 0.295 Character of Wound/Ulcer Post Requires Further Debridement: Debridement Post Procedure Diagnosis Same as Pre-procedure Electronic Signature(s) Signed: 10/24/2016 1:57:19 PM By: Christin Fudge MD, FACS Signed: 10/24/2016 4:53:37 PM By: Gretta Cool, BSN, RN, CWS, Kim RN, BSN Entered By: Christin Fudge on 10/24/2016 13:57:19 Kneece, Nina D. ([id number]) -------------------------------------------------------------------------------- Debridement Details Patient Name: Jeremy Gang D. Date of Service: 10/24/2016 12:30 PM Medical Record Number: [id number] Patient Account Number: [id number] Date of Birth/Sex: 1941-01-05 (76 y.o. Male) Treating RN: Cornell Barman Primary Care Provider: [name] Other Clinician: Referring Provider: [name] Treating Provider/Extender: Frann Rider in Treatment: 4 Debridement Performed for Wound #3 Left,Plantar Foot Assessment: Performed By: Physician Christin Fudge, MD Debridement: Debridement Pre-procedure Verification/Time Out Yes - 13:00 Taken: Start Time: 13:01 Pain Control: Other : lidocaine 4% Level: Skin/Subcutaneous Tissue Total Area Debrided (L x 1.5 (cm) x 0.3 (cm) = 0.45 (cm) W): Tissue and other Viable, Non-Viable, Callus, Fibrin/Slough, Subcutaneous material debrided: Instrument: Curette Bleeding: Minimum Hemostasis Achieved: Pressure End Time: 13:04 Procedural Pain: 3 Post Procedural Pain: 0 Response to Treatment: Procedure was tolerated well Post Debridement Measurements of Total Wound Length: (cm) 1.5 Width: (cm) 0.2 Depth: (cm) 1 Volume: (cm) 0.236 Character of Wound/Ulcer Post Requires Further Debridement Debridement: Post Procedure Diagnosis Same as Pre-procedure Electronic Signature(s) Signed: 10/24/2016 1:58:02 PM By: Christin Fudge MD, FACS Signed: 10/24/2016  4:53:37 PM By: Gretta Cool, BSN, RN, CWS, Kim RN, BSN Entered By: Christin Fudge on 10/24/2016 13:58:01 Vanaken, Zach D. ([id number]) -------------------------------------------------------------------------------- HPI Details Patient Name: Jeremy Gang D. Date of Service: 10/24/2016 12:30 PM Medical Record Number: [id number] Patient Account Number: [id number] Date of Birth/Sex: 28-Jul-1940 (76 y.o. Male) Treating RN: Cornell Barman Primary Care Provider: [name] Other Clinician: Referring Provider: [name] Treating Provider/Extender: Frann Rider in Treatment: 4 History of Present Illness Location: left heel ulceration and left plantar foot ulcer Quality: Patient reports experiencing  a dull pain to affected area(s). Severity: Patient states wound are getting worse. Duration: Patient has had the wound for > 2 months prior to seeking treatment at the wound center Timing: Pain in wound is Intermittent (comes and goes Context: The wound appeared gradually over time Modifying Factors: Other treatment(s) tried include:local care and offloading Associated Signs and Symptoms: Patient reports having difficulty standing for long periods. HPI Description: 76 year old gentleman who is a reformed alcoholic for about 6 months now was referred to as by his PCP team for a left heel ulceration which she's had for about 2 months. He has a history of alcohol abuse, aneurysm, hypertensive renal disease, anemia of chronic kidney disease, atrial fibrillation with RVR, COPD, hypertension, seizure disorders, tobacco abuse, cerebral aneurysm repair in 1997 and some ice surgery. He continues to smoke about half a pack of cigarettes a day. the patient is not fully competent and has a part of attorney along with him today. 07/03/2016 -- x-ray of the left heel shows a soft tissue wound with no bony destruction or concerns for osteomyelitis. 07/10/2016 -- arterial studies are still pending. 07/30/2016 --  lower arterial examination done shows a right ABI of 0.85 and a left ABI of 0.59. 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 Bilateral CFA stenosis. Occluded left mid distal SFA. Three-vessel runoff bilaterally. The bilateral great toe pressures were also abnormal in the 10 digit toe PPGos are abnormal. PV consult suggested -- to see Dr. Fletcher Anon. appointment scheduled for this coming Tuesday 08/07/2016 -- was seen in the office by Dr. Annia Belt. He reviewed his arterial studies with multilevel disease including iliac and SFA and recommended an abdominal aortogram, lower extremity runoff and possible endovascular intervention. 09/26/16 patient is seen for evaluation today for his left heel wound which we were treating for him prior to his admission to the hospital and then subsequent admission to a skilled nursing facility. Unfortunately he also has a new wound on the plantar surface of his left foot which is new and appears to have been facility acquired. He is having some discomfort he tells me but then does not seem to experience any pain with palpation over the wound. 10/03/2016 -- since I saw him last in early May, he had a vascular balloon angioplasty with an angiogram done by Dr. Fletcher Anon on 08/13/2016. he was found to have a short occlusion of the left distal SFA with heavy calcification and a 3 vessel runoff below the knee. Unsuccessful attempt at angioplasty of the left SFA due Johnsen, Coltyn D. ([id number]) to inability to cross the occlusion. He had recommended an angioplasty of the left SFA via retrograde anterior tibial axis if the patient is agreeable. The patient was later admitted between May 14 to May 19, with the discharge diagnosis of seizures, tobacco use disorder, essential hypertension, peripheral vascular disease, chronic kidney disease, lactic acidosis and pressure  injury of skin. He was treated for a community-acquired pneumonia and then discharged to a skilled nursing facility. Oncology was working him up for follow-up of multiple myeloma. At follow-up Dr. 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 He would give him appropriate symptomatic treatment and follow-up in 2 months. Dr. Fletcher Anon saw him again on 09/09/2016 and increased his dose of Lasix to 40 mg twice daily. Since the ulcer was progressing well he did not plan  to perform revascularization and would follow him up in 3 months. 10/10/16 on evaluation today patient appears to be doing better in regard to his left foot wounds. Both are improved compared to when I last saw him on evaluation. There is no evidence of infection 10/24/2016 -- had a left foot x-ray which showed soft tissue changes without acute bony abnormality. Electronic Signature(s) Signed: 10/24/2016 1:58:17 PM By: Christin Fudge MD, FACS Previous Signature: 10/24/2016 12:56:50 PM Version By: Christin Fudge MD, FACS Entered By: Christin Fudge on 10/24/2016 13:58:17 JEOVANNI, HEURING DMarland Kitchen ([id number]) -------------------------------------------------------------------------------- Physical Exam Details Patient Name: DYQUAN, MINKS D. Date of Service: 10/24/2016 12:30 PM Medical Record Number: [id number] Patient Account Number: [id number] Date of Birth/Sex: 02-13-1941 (76 y.o. Male) Treating RN: Cornell Barman Primary Care Provider: [name] Other Clinician: Referring Provider: [name] Treating Provider/Extender: Frann Rider in Treatment: 4 Constitutional . Pulse regular. Respirations normal and unlabored. Afebrile. . Eyes Nonicteric. Reactive to light. Ears, Nose, Mouth, and Throat Lips, teeth, and gums WNL.Marland Kitchen Moist mucosa without lesions. Neck supple and nontender. No palpable supraclavicular or cervical adenopathy. Normal sized without  goiter. Respiratory WNL. No retractions.. Breath sounds WNL, No rubs, rales, rhonchi, or wheeze.. Cardiovascular Heart rhythm and rate regular, no murmur or gallop.. Pedal Pulses WNL. No clubbing, cyanosis or edema. Chest Breasts symmetical and no nipple discharge.. Breast tissue WNL, no masses, lumps, or tenderness.. Lymphatic No adneopathy. No adenopathy. No adenopathy. Musculoskeletal Adexa without tenderness or enlargement.. Digits and nails w/o clubbing, cyanosis, infection, petechiae, ischemia, or inflammatory conditions.. Integumentary (Hair, Skin) No suspicious lesions. No crepitus or fluctuance. No peri-wound warmth or erythema. No masses.Marland Kitchen Psychiatric Judgement and insight Intact.. No evidence of depression, anxiety, or agitation.. Notes sharp debridement is required for both wounds the one on the heel was done with a #3 curet and the one on the plantar aspect was done with forceps and scissors and bleeding controlled with pressure Electronic Signature(s) Signed: 10/24/2016 1:58:49 PM By: Christin Fudge MD, FACS Entered By: Christin Fudge on 10/24/2016 13:58:48 Sane, Charlett Lango ([id number]) -------------------------------------------------------------------------------- Physician Orders Details Patient Name: Jeremy Gang D. Date of Service: 10/24/2016 12:30 PM Medical Record Number: [id number] Patient Account Number: [id number] Date of Birth/Sex: 22-Sep-1940 (76 y.o. Male) Treating RN: Cornell Barman Primary Care Provider: [name] Other Clinician: Referring Provider: [name] Treating Provider/Extender: Frann Rider in Treatment: 4 Verbal / Phone Orders: No Diagnosis Coding Wound Cleansing Wound #2 Left Calcaneus o Clean wound with Normal Saline. o Cleanse wound with mild soap and water Wound #3 Left,Plantar Foot o Clean wound with Normal Saline. o Cleanse wound with mild soap and water Anesthetic Wound #2 Left Calcaneus o Topical Lidocaine  4% cream applied to wound bed prior to debridement - for clinic use Wound #3 Left,Plantar Foot o Topical Lidocaine 4% cream applied to wound bed prior to debridement - for clinic use Primary Wound Dressing Wound #2 Left Calcaneus o Aquacel Ag Wound #3 Left,Plantar Foot o Aquacel Ag Secondary Dressing Wound #2 Left Calcaneus o ABD and Kerlix/Conform o Foam - Allevyn Heel Cup Wound #3 Left,Plantar Foot o ABD and Kerlix/Conform o Foam - Allevyn Heel Cup Dressing Change Frequency Wound #2 Left Calcaneus o Change Dressing Monday, Wednesday, Friday KVON, MCILHENNY ([id number]) Wound #3 Left,Plantar Foot o Change Dressing Monday, Wednesday, Friday Follow-up Appointments Wound #2 Left Calcaneus o Return Appointment in 1 week. Wound #3 Left,Plantar Foot o Return Appointment in 1 week. Edema Control Wound #2 Left Calcaneus o Elevate legs to the level of  the heart and pump ankles as often as possible Wound #3 Left,Plantar Foot o Elevate legs to the level of the heart and pump ankles as often as possible Off-Loading Wound #2 Left Calcaneus o Turn and reposition every 2 hours o Other: - Darco shoe Wound #3 Left,Plantar Foot o Turn and reposition every 2 hours o Other: - Darco shoe Additional Orders / Instructions Wound #2 Left Calcaneus o Increase protein intake. Wound #3 Left,Plantar Foot o Increase protein intake. Home Health Wound #2 Left Calcaneus o Continue Home Health Visits - [facility] o Home Health Nurse may visit PRN to address patientos wound care needs. o FACE TO FACE ENCOUNTER: MEDICARE and MEDICAID PATIENTS: I certify that this patient is under my care and that I had a face-to-face encounter that meets the physician face-to-face encounter requirements with this patient on this date. The encounter with the patient was in whole or in part for the following MEDICAL CONDITION: (primary reason for [place]) MEDICAL  NECESSITY: I certify, that based on my findings, NURSING services are a medically necessary home health service. HOME BOUND STATUS: I certify that my clinical findings support that this patient is homebound (i.e., Due to illness or injury, pt requires aid of supportive devices such as crutches, cane, wheelchairs, walkers, the use of special transportation or the assistance of another person to leave their place of residence. There is a [place], Kyuss D. ([id number]) normal inability to leave the home and doing so requires considerable and taxing effort. Other absences are for medical reasons / religious services and are infrequent or of short duration when for other reasons). o If current dressing causes regression in wound condition, may D/C ordered dressing product/s and apply Normal Saline Moist Dressing daily until next [place] / Other MD appointment. [place] of regression in wound condition at [phone]. o Please direct any NON-WOUND related issues/requests for orders to patient's Primary Care Physician Wound #3 [place] Nurse may visit PRN to address patientos wound care needs. o FACE TO FACE ENCOUNTER: MEDICARE and MEDICAID PATIENTS: I certify that this patient is under my care and that I had a face-to-face encounter that meets the physician face-to-face encounter requirements with this patient on this date. The encounter with the patient was in whole or in part for the following MEDICAL CONDITION: (primary reason for [place]) MEDICAL NECESSITY: I certify, that based on my findings, NURSING services are a medically necessary home health service. HOME BOUND STATUS: I certify that my clinical findings support that this patient is homebound (i.e., Due to illness or injury, pt requires aid of supportive devices such as crutches, cane, wheelchairs, walkers, the use of  special transportation or the assistance of another person to leave their place of residence. There is a normal inability to leave the home and doing so requires considerable and taxing effort. Other absences are for medical reasons / religious services and are infrequent or of short duration when for other reasons). o If current dressing causes regression in wound condition, may D/C ordered dressing product/s and apply Normal Saline Moist Dressing daily until next [place] / Other MD appointment. [place] of regression in wound condition at [phone]. o Please direct any NON-WOUND related issues/requests for orders to patient's Primary Care Physician Medications-please add to medication list. Wound #2 Left Calcaneus o Other: - Vitamin C, Zinc, MVI Wound #3 Left,Plantar Foot o Other: - Vitamin  C, Zinc, MVI Electronic Signature(s) Signed: 10/24/2016 4:13:09 PM By: Christin Fudge MD, FACS Signed: 10/24/2016 4:53:37 PM By: Gretta Cool, BSN, RN, CWS, Kim RN, BSN Entered By: Gretta Cool, BSN, RN, CWS, Kim on 10/24/2016 13:17:40 YERACHMIEL, SPINNEY DMarland Kitchen ([id number]) -------------------------------------------------------------------------------- Problem List Details Patient Name: TALIESIN, HARTLAGE D. Date of Service: 10/24/2016 12:30 PM Medical Record Number: [id number] Patient Account Number: [id number] Date of Birth/Sex: 1940/09/28 (76 y.o. Male) Treating RN: Cornell Barman Primary Care Provider: [name] Other Clinician: Referring Provider: [name] Treating Provider/Extender: Frann Rider in Treatment: 4 Active Problems ICD-10 Encounter Code Description Active Date Diagnosis [phone] Pressure ulcer of left heel, stage 4 09/26/2016 Yes I70.245 Atherosclerosis of native arteries of left leg with ulceration 09/26/2016 Yes of other part of foot L97.522 Non-pressure chronic ulcer of other part of left foot with fat 09/26/2016 Yes layer exposed F17.218 Nicotine  dependence, cigarettes, with other nicotine- 10/03/2016 Yes induced disorders Inactive Problems Resolved Problems Electronic Signature(s) Signed: 10/24/2016 1:56:55 PM By: Christin Fudge MD, FACS Entered By: Christin Fudge on 10/24/2016 13:56:55 Borel, Raphael D. ([id number]) -------------------------------------------------------------------------------- Progress Note Details Patient Name: Jeremy Gang D. Date of Service: 10/24/2016 12:30 PM Medical Record Number: [id number] Patient Account Number: [id number] Date of Birth/Sex: 1940/05/22 (76 y.o. Male) Treating RN: Cornell Barman Primary Care Provider: [name] Other Clinician: Referring Provider: [name] Treating Provider/Extender: Frann Rider in Treatment: 4 Subjective Chief Complaint Information obtained from Patient Patient is at the clinic for treatment of an open pressure ulcer the left heel and most recently has also developed a ulcer on the plantar aspect of his left foot near the fourth metatarsal head. History of Present Illness (HPI) The following HPI elements were documented for the patient's wound: Location: left heel ulceration and left plantar foot ulcer Quality: Patient reports experiencing a dull pain to affected area(s). Severity: Patient states wound are getting worse. Duration: Patient has had the wound for > 2 months prior to seeking treatment at the wound center Timing: Pain in wound is Intermittent (comes and goes Context: The wound appeared gradually over time Modifying Factors: Other treatment(s) tried include:local care and offloading Associated Signs and Symptoms: Patient reports having difficulty standing for long periods. 76 year old gentleman who is a reformed alcoholic for about 6 months now was referred to as by his PCP team for a left heel ulceration which she's had for about 2 months. He has a history of alcohol abuse, aneurysm, hypertensive renal disease, anemia of chronic kidney  disease, atrial fibrillation with RVR, COPD, hypertension, seizure disorders, tobacco abuse, cerebral aneurysm repair in 1997 and some ice surgery. He continues to smoke about half a pack of cigarettes a day. the patient is not fully competent and has a part of attorney along with him today. 07/03/2016 -- x-ray of the left heel shows a soft tissue wound with no bony destruction or concerns for osteomyelitis. 07/10/2016 -- arterial studies are still pending. 07/30/2016 -- lower arterial examination done shows a right ABI of 0.85 and a left ABI of 0.59. 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 Bilateral CFA stenosis. Occluded left mid distal SFA. Three-vessel runoff bilaterally. The bilateral great toe pressures were also abnormal in the 10 digit toe PPG s are abnormal. PV consult suggested -- to see Dr. Fletcher Anon. appointment scheduled for this coming Tuesday 08/07/2016 -- was seen in the office by Dr. Annia Belt. He reviewed his arterial studies with multilevel  disease including iliac and SFA and recommended an abdominal aortogram, lower extremity runoff and possible endovascular intervention. GRAYDON, FOFANA ([id number]) 09/26/16 patient is seen for evaluation today for his left heel wound which we were treating for him prior to his admission to the hospital and then subsequent admission to a skilled nursing facility. Unfortunately he also has a new wound on the plantar surface of his left foot which is new and appears to have been facility acquired. He is having some discomfort he tells me but then does not seem to experience any pain with palpation over the wound. 10/03/2016 -- since I saw him last in early May, he had a vascular balloon angioplasty with an angiogram done by Dr. Fletcher Anon on 08/13/2016. he was found to have a short occlusion of the left distal SFA with  heavy calcification and a 3 vessel runoff below the knee. Unsuccessful attempt at angioplasty of the left SFA due to inability to cross the occlusion. He had recommended an angioplasty of the left SFA via retrograde anterior tibial axis if the patient is agreeable. The patient was later admitted between May 14 to May 19, with the discharge diagnosis of seizures, tobacco use disorder, essential hypertension, peripheral vascular disease, chronic kidney disease, lactic acidosis and pressure injury of skin. He was treated for a community-acquired pneumonia and then discharged to a skilled nursing facility. Oncology was working him up for follow-up of multiple myeloma. At follow-up Dr. 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 He would give him appropriate symptomatic treatment and follow-up in 2 months. Dr. Fletcher Anon saw him again on 09/09/2016 and increased his dose of Lasix to 40 mg twice daily. Since the ulcer was progressing well he did not plan to perform revascularization and would follow him up in 3 months. 10/10/16 on evaluation today patient appears to be doing better in regard to his left foot wounds. Both are improved compared to when I last saw him on evaluation. There is no evidence of infection 10/24/2016 -- had a left foot x-ray which showed soft tissue changes without acute bony abnormality. Objective Constitutional Pulse regular. Respirations normal and unlabored. Afebrile. Vitals Time Taken: 12:48 PM, Weight: 123.8 lbs, Temperature: 97.9 F, Pulse: 40 bpm, Respiratory Rate: 16 breaths/min, Blood Pressure: 143/53 mmHg. Eyes Nonicteric. Reactive to light. Ears, Nose, Mouth, and Throat Lips, teeth, and gums WNL.Marland Kitchen Moist mucosa without lesions. GARRIT, MARROW D. ([id number]) Neck supple and nontender. No palpable supraclavicular or cervical adenopathy. Normal sized without goiter. Respiratory WNL. No  retractions.. Breath sounds WNL, No rubs, rales, rhonchi, or wheeze.. Cardiovascular Heart rhythm and rate regular, no murmur or gallop.. Pedal Pulses WNL. No clubbing, cyanosis or edema. Chest Breasts symmetical and no nipple discharge.. Breast tissue WNL, no masses, lumps, or tenderness.. Lymphatic No adneopathy. No adenopathy. No adenopathy. Musculoskeletal Adexa without tenderness or enlargement.. Digits and nails w/o clubbing, cyanosis, infection, petechiae, ischemia, or inflammatory conditions.Marland Kitchen Psychiatric Judgement and insight Intact.. No evidence of depression, anxiety, or agitation.. General Notes: sharp debridement is required for both wounds the one on the heel was done with a #3 curet and the one on the plantar aspect was done with forceps and scissors and bleeding controlled with pressure Integumentary (Hair, Skin) No suspicious lesions. No crepitus or fluctuance. No peri-wound warmth or erythema. No masses.. Wound #2 status is Open. Original cause of wound was Pressure Injury. The wound is located on the Left Calcaneus. The wound measures 0.5cm length  x 1.5cm width x 0.5cm depth; 0.589cm^2 area and 0.295cm^3 volume. There is Fat Layer (Subcutaneous Tissue) Exposed exposed. There is no tunneling or undermining noted. There is a large amount of serosanguineous drainage noted. The wound margin is distinct with the outline attached to the wound base. There is large (67-100%) red, pink granulation within the wound bed. There is a small (1-33%) amount of necrotic tissue within the wound bed including Adherent Slough. The periwound skin appearance exhibited: Callus. The periwound skin appearance did not exhibit: Crepitus, Excoriation, Induration, Rash, Scarring, Dry/Scaly, Maceration, Atrophie Blanche, Cyanosis, Ecchymosis, Hemosiderin Staining, Mottled, Pallor, Rubor, Erythema. Periwound temperature was noted as No Abnormality. The periwound has tenderness on palpation. Wound #3  status is Open. Original cause of wound was Gradually Appeared. The wound is located on the [place]. The wound measures 1.5cm length x 0.2cm width x 1cm depth; 0.236cm^2 area and 0.236cm^3 volume. There is Fat Layer (Subcutaneous Tissue) Exposed exposed. There is no tunneling noted, however, there is undermining starting at 12:00 and ending at 12:00 with a maximum distance of 0.8cm. There is a large amount of serosanguineous drainage noted. The wound margin is distinct with the outline attached to the wound base. There is no granulation within the wound bed. There is a large (67- 100%) amount of necrotic tissue within the wound bed including Adherent Slough. The periwound skin appearance exhibited: Callus. The periwound skin appearance did not exhibit: Crepitus, Excoriation, Induration, Rash, Scarring, Dry/Scaly, Maceration, Atrophie Blanche, Cyanosis, Ecchymosis, Hemosiderin Riche, Kaymon D. ([id number]) Staining, Mottled, Pallor, Rubor, Erythema. Periwound temperature was noted as No Abnormality. The periwound has tenderness on palpation. 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 There was a Skin/Subcutaneous Tissue Debridement ([id number]) debridement with total area of 0.75 sq cm performed by Christin Fudge, MD. with the following instrument(s): Curette to remove Viable and Non-Viable tissue/material including Callus and Subcutaneous after achieving pain control using Other (lidocaine 4%). A time out was conducted at 13:00, prior to the start of the procedure. A Minimum amount  of bleeding was controlled with Pressure. The procedure was tolerated well with a pain level of 3 throughout and a pain level of 0 following the procedure. Post Debridement Measurements: 0.5cm length x 1.5cm width x 0.5cm depth; 0.295cm^3 volume. Post debridement Stage noted as Category/Stage II. Character of Wound/Ulcer Post Debridement requires further debridement. Post procedure Diagnosis Wound #2: Same as Pre-Procedure Wound #3 Pre-procedure diagnosis of Wound #3 is a Trauma, Other located on the [place] . There was a Skin/Subcutaneous Tissue Debridement ([id number]) debridement with total area of 0.45 sq cm performed by Christin Fudge, MD. with the following instrument(s): Curette to remove Viable and Non-Viable tissue/material including Fibrin/Slough, Callus, and Subcutaneous after achieving pain control using Other (lidocaine 4%). A time out was conducted at 13:00, prior to the start of the procedure. A Minimum amount of bleeding was controlled with Pressure. The procedure was tolerated well with a pain level of 3 throughout and a pain level of 0 following the procedure. Post Debridement Measurements: 1.5cm length x 0.2cm width x 1cm depth; 0.236cm^3 volume. Character of Wound/Ulcer Post Debridement requires further debridement.  Post procedure Diagnosis Wound #3: Same as Pre-Procedure Felber, Mory D. ([id number]) Plan Wound Cleansing: Wound #2 Left Calcaneus: Clean wound with Normal Saline. Cleanse wound with mild soap and water Wound #3 Left,Plantar Foot: Clean wound with Normal Saline. Cleanse wound with mild soap and water Anesthetic: Wound #2 Left Calcaneus: Topical Lidocaine 4% cream applied to wound bed prior to debridement - for clinic use Wound #3 Left,Plantar Foot: Topical Lidocaine 4% cream applied to wound bed prior to debridement - for clinic use Primary Wound Dressing: Wound #2 Left Calcaneus: Aquacel Ag Wound #3 Left,Plantar Foot: Aquacel Ag Secondary  Dressing: Wound #2 Left Calcaneus: ABD and Kerlix/Conform Foam - Allevyn Heel Cup Wound #3 Left,Plantar Foot: ABD and Kerlix/Conform Foam - Allevyn Heel Cup Dressing Change Frequency: Wound #2 Left Calcaneus: Change Dressing Monday, Wednesday, Friday Wound #3 Left,Plantar Foot: Change Dressing Monday, Wednesday, Friday Follow-up Appointments: Wound #2 Left Calcaneus: Return Appointment in 1 week. Wound #3 Left,Plantar Foot: Return Appointment in 1 week. Edema Control: Wound #2 Left Calcaneus: Elevate legs to the level of the heart and pump ankles as often as possible Wound #3 Left,Plantar Foot: Elevate legs to the level of the heart and pump ankles as often as possible Off-Loading: Wound #2 Left Calcaneus: Turn and reposition every 2 hours Other: - Darco shoe Wound #3 Left,Plantar Foot: Turn and reposition every 2 hours Vea, Auden D. ([id number]) Other: - Darco shoe Additional Orders / Instructions: Wound #2 Left Calcaneus: Increase protein intake. Wound #3 Left,Plantar Foot: Increase protein intake. Home Health: Wound #2 Left Calcaneus: Continue Home Health Visits - [facility] Nurse may visit PRN to address patient s wound care needs. FACE TO FACE ENCOUNTER: MEDICARE and MEDICAID PATIENTS: I certify that this patient is under my care and that I had a face-to-face encounter that meets the physician face-to-face encounter requirements with this patient on this date. The encounter with the patient was in whole or in part for the following MEDICAL CONDITION: (primary reason for [place]) MEDICAL NECESSITY: I certify, that based on my findings, NURSING services are a medically necessary home health service. HOME BOUND STATUS: I certify that my clinical findings support that this patient is homebound (i.e., Due to illness or injury, pt requires aid of supportive devices such as crutches, cane, wheelchairs, walkers, the use of special transportation or the  assistance of another person to leave their place of residence. There is a normal inability to leave the home and doing so requires considerable and taxing effort. Other absences are for medical reasons / religious services and are infrequent or of short duration when for other reasons). If current dressing causes regression in wound condition, may D/C ordered dressing product/s and apply Normal Saline Moist Dressing daily until next [place] / Other MD appointment. [place] of regression in wound condition at [phone]. Please direct any NON-WOUND related issues/requests for orders to patient's Primary Care Physician Wound #3 Left,Plantar Foot: [place] Nurse may visit PRN to address patient s wound care needs. FACE TO FACE ENCOUNTER: MEDICARE and MEDICAID PATIENTS: I certify that this patient is under my care and that I had a face-to-face encounter that meets the physician face-to-face encounter requirements with this patient on this date. The encounter with the patient was in whole or in part for the following MEDICAL CONDITION: (primary reason for [place]) MEDICAL NECESSITY: I certify, that based on my findings, NURSING services are a medically necessary home health  service. HOME BOUND STATUS: I certify that my clinical findings support that this patient is homebound (i.e., Due to illness or injury, pt requires aid of supportive devices such as crutches, cane, wheelchairs, walkers, the use of special transportation or the assistance of another person to leave their place of residence. There is a normal inability to leave the home and doing so requires considerable and taxing effort. Other absences are for medical reasons / religious services and are infrequent or of short duration when for other reasons). If current dressing causes regression in wound condition, may D/C ordered dressing product/s and  apply Normal Saline Moist Dressing daily until next [place] / Other MD appointment. [place] of regression in wound condition at [phone]. Please direct any NON-WOUND related issues/requests for orders to patient's Primary Care Physician Medications-please add to medication list.: Wound #2 Left Calcaneus: Other: - Vitamin C, Zinc, MVI Wound #3 Left,Plantar Foot: Other: - Vitamin C, Zinc, MVI Gagen, Garvey D. ([id number]) After review today I have recommended: 1. silver alginate to both wounds and offloading has been discussed in great detail. 2. x-ray of the left foot -- results discussed with them 3. Reiterated the need to completely give up smoking 4. Adequate protein, vitamin A, vitamin C and zinc 5. Regular visits to the wound center Electronic Signature(s) Signed: 10/24/2016 1:59:43 PM By: Christin Fudge MD, FACS Entered By: Christin Fudge on 10/24/2016 13:59:43 Debruler, Areg D. ([id number]) -------------------------------------------------------------------------------- SuperBill Details Patient Name: Jeremy Gang D. Date of Service: 10/24/2016 Medical Record Number: [id number] Patient Account Number: [id number] Date of Birth/Sex: 1940-11-24 (76 y.o. 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 Nicotine dependence, cigarettes, with other nicotine-induced disorders Facility Procedures CPT4: Description Modifier Quantity Code [id number] 11042 - DEB SUBQ TISSUE 20 SQ CM/< 1 ICD-10 Description Diagnosis 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,  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  L97.522 Non-pressure chronic ulcer of other part of left foot with fat layer exposed Physician Procedures CPT4: Description Modifier Quantity Code [id number] - WC PHYS LEVEL 3 - EST PT 1 ICD-10 Description Diagnosis 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

## 2016-10-31 ENCOUNTER — Encounter: Payer: Medicare Other | Admitting: Surgery

## 2016-10-31 DIAGNOSIS — I70245 Atherosclerosis of native arteries of left leg with ulceration of other part of foot: Secondary | ICD-10-CM | POA: Diagnosis not present

## 2016-11-02 NOTE — Progress Notes (Signed)
Jeremy, [name] ([id number]) Visit Report for 10/31/2016 Arrival Information Details Patient Name: [name], [name] Date of Service: 10/31/2016 9:45 AM Medical Record Number: [id number] Patient Account Number: [id number] Date of Birth/Sex: [birth year] (76 y.o. Male) Treating RN: [name] Primary Care Shantel Wesely: [name] Other Clinician: Referring [name]: [name] Treating [name]/Extender: [name] in Treatment: 5 Visit Information History Since Last Visit Added or deleted any medications: No Patient Arrived: Ambulatory Any new allergies or adverse reactions: No Arrival Time: 09:44 Had a fall or experienced change in No Accompanied By: cousin activities of daily living that may affect Transfer Assistance: None risk of falls: Patient Identification Verified: Yes Signs or symptoms of abuse/neglect No Secondary Verification Process Yes since last visito Completed: Hospitalized since last visit: No Patient Requires Transmission-Based No Has Dressing in Place as Prescribed: Yes Precautions: Has Footwear/Offloading in Place as Yes Patient Has Alerts: No Prescribed: Left: Surgical Shoe with Pressure Relief Insole Pain Present Now: No Electronic Signature(s) Signed: 10/31/2016 4:29:04 PM By: [name], BSN, RN, CWS, Kim RN, BSN Entered By: [name], BSN, RN, CWS, [name] on 10/31/2016 09:45:27 [name] ([id number]) -------------------------------------------------------------------------------- Encounter Discharge Information Details Patient Name: Jeremy Gang [name]. Date of Service: 10/31/2016 9:45 AM Medical Record Number: [id number] Patient Account Number: [id number] Date of Birth/Sex: 04/11/1940 (76 y.o. Male) Treating RN: [name] Primary Care Judianne Seiple: [name] Other Clinician: Referring [name]: [name] Treating Ameah Chanda/Extender: [name] in Treatment: 5 Encounter Discharge Information Items Discharge Pain Level:  0 Discharge Condition: Stable Ambulatory Status: Ambulatory Discharge Destination: Home Transportation: Private Auto Accompanied By: cousin Schedule Follow-up Appointment: Yes Medication Reconciliation completed and provided to Patient/Care Yes [name]: Provided on Clinical Summary of Care: 10/31/2016 Form Type Recipient Paper Patient [facility] Electronic Signature(s) Signed: 10/31/2016 4:29:04 PM By: [name], BSN, RN, CWS, Kim RN, BSN Previous Signature: 10/31/2016 10:21:53 AM Version By: Ruthine Dose Entered By: [name] BSN, RN, CWS, [name] on 10/31/2016 10:27:13 Jeremy, [name] Kitchen ([id number]) -------------------------------------------------------------------------------- Lower Extremity Assessment Details Patient Name: [name], [name] Date of Service: 10/31/2016 9:45 AM Medical Record Number: [id number] Patient Account Number: [id number] Date of Birth/Sex: June 25, 1940 (76 y.o. Male) Treating RN: [name] Primary Care Hero Mccathern: [name] Other Clinician: Referring Karina Lenderman: [name] Treating Jodye Scali/Extender: [name] in Treatment: 5 Vascular Assessment Pulses: Dorsalis Pedis Palpable: [Left:Yes] Posterior Tibial Extremity colors, hair growth, and conditions: Extremity Color: [Left:Normal] Hair Growth on Extremity: [Left:No] Temperature of Extremity: [Left:Warm] Capillary Refill: [Left:< 3 seconds] Dependent Rubor: [Left:No] Blanched when Elevated: [Left:No] Lipodermatosclerosis: [Left:No] Toe Nail Assessment Left: Right: Thick: Yes Discolored: Yes Deformed: Yes Improper Length and Hygiene: No Electronic Signature(s) Signed: 10/31/2016 4:29:04 PM By: [name], BSN, RN, CWS, Kim RN, BSN Entered By: [name], BSN, RN, CWS, [name] on 10/31/2016 09:57:49 Jeremy, Iva [name]. ([id number]) -------------------------------------------------------------------------------- Multi Wound Chart Details Patient Name: Jeremy Gang [name]. Date of Service: 10/31/2016 9:45 AM Medical Record  Number: [id number] Patient Account Number: [id number] Date of Birth/Sex: 15-Jun-1940 (76 y.o. Male) Treating RN: [name] Primary Care Anely Spiewak: [name] Other Clinician: Referring Copper Kirtley: [name] Treating Lameshia Hypolite/Extender: [name] in Treatment: 5 Vital Signs Height(in): Pulse(bpm): 42 Weight(lbs): 123.8 Blood Pressure 175/60 (mmHg): Body Mass Index(BMI): Temperature(F): 97.6 Respiratory Rate 16 (breaths/min): Photos: [N/A:N/A] Wound Location: Left Calcaneus Left Foot - Plantar N/A Wounding Event: Pressure Injury Gradually Appeared N/A Primary Etiology: Pressure Ulcer Trauma, Other N/A Comorbid History: Anemia, Chronic Anemia, Chronic N/A Obstructive Pulmonary Obstructive Pulmonary Disease (COPD), Disease (COPD), Arrhythmia, Hypertension, Arrhythmia, Hypertension, Dementia, Seizure Dementia, Seizure Disorder Disorder Date  Acquired: 05/29/2016 09/12/2016 N/A Weeks of Treatment: 5 5 N/A Wound Status: Open Open N/A Pending Amputation on No Yes N/A Presentation: Measurements L x W x [name] 1.5x0.2x0.3 0.7x0.2x0.5 N/A (cm) Area (cm) : 0.236 0.11 N/A Volume (cm) : 0.071 0.055 N/A % Reduction in Area: 84.60% 93.00% N/A % Reduction in Volume: 84.50% 65.00% N/A Classification: Category/Stage II Partial Thickness N/A Exudate Amount: Large Large N/A Exudate Type: Serosanguineous Serosanguineous N/A Exudate Color: red, brown red, brown N/A [name], [name] ([id number]) Wound Margin: Distinct, outline attached Distinct, outline attached N/A Granulation Amount: Large (67-100%) None Present (0%) N/A Granulation Quality: Red, Pink N/A N/A Necrotic Amount: Small (1-33%) Large (67-100%) N/A Exposed Structures: Fat Layer (Subcutaneous Fat Layer (Subcutaneous N/A Tissue) Exposed: Yes Tissue) Exposed: Yes Fascia: No Fascia: No Tendon: No Tendon: No Muscle: No Muscle: No Joint: No Joint: No Bone: No Bone: No Epithelialization: None None  N/A Debridement: Debridement ([id number] Debridement (11042- N/A 11047) 11047) Pre-procedure 10:03 10:03 N/A Verification/Time Out Taken: Pain Control: Other Other N/A Tissue Debrided: Callus, Subcutaneous Callus, Subcutaneous N/A Level: Skin/Subcutaneous Skin/Subcutaneous N/A Tissue Tissue Debridement Area (sq 0.45 0.14 N/A cm): Instrument: Curette Curette N/A Bleeding: Minimum Minimum N/A Hemostasis Achieved: Pressure Pressure N/A Procedural Pain: 0 0 N/A Post Procedural Pain: 0 0 N/A Debridement Treatment Procedure was tolerated Procedure was tolerated N/A Response: well well Post Debridement 1.5x0.3x0.4 0.7x0.2x0.7 N/A Measurements L x W x [name] (cm) Post Debridement 0.141 0.077 N/A Volume: (cm) Post Debridement Category/Stage II N/A N/A Stage: Periwound Skin Texture: Callus: Yes Callus: Yes N/A Excoriation: No Excoriation: No Induration: No Induration: No Crepitus: No Crepitus: No Rash: No Rash: No Scarring: No Scarring: No Periwound Skin Maceration: No Maceration: No N/A Moisture: Dry/Scaly: No Dry/Scaly: No Periwound Skin Color: Atrophie Blanche: No Atrophie Blanche: No N/A Cyanosis: No Cyanosis: No Ecchymosis: No Ecchymosis: No Erythema: No Erythema: No Hemosiderin Staining: No Hemosiderin Staining: No [place], Tru [name]. ([id number]) Mottled: No Mottled: No Pallor: No Pallor: No Rubor: No Rubor: No Temperature: No Abnormality No Abnormality N/A Tenderness on Yes Yes N/A Palpation: Wound Preparation: Ulcer Cleansing: Ulcer Cleansing: N/A Rinsed/Irrigated with Rinsed/Irrigated with Saline Saline Topical Anesthetic Topical Anesthetic Applied: Other: lidocaine Applied: Other: lidocaine 4% 4% Procedures Performed: Debridement Debridement N/A Treatment Notes Electronic Signature(s) Signed: 10/31/2016 10:12:07 AM By: Christin Fudge MD, FACS Entered By: Christin Fudge on 10/31/2016 10:12:07 [name], [name]  ([id number]) -------------------------------------------------------------------------------- [place] Details Patient Name: ERRICK, SALTS [name]. Date of Service: 10/31/2016 9:45 AM Medical Record Number: [id number] Patient Account Number: [id number] Date of Birth/Sex: [birth year] (76 y.o. Male) Treating RN: [name] Primary Care Wanda Rideout: [name] Other Clinician: Referring Fontella Shan: [name] Treating Doniesha Landau/Extender: [name] in Treatment: 5 Active Inactive ` Abuse / Safety / Falls / Self Care Management Nursing Diagnoses: Potential for falls Goals: Patient will not experience any injury related to falls Date Initiated: 09/26/2016 Target Resolution Date: 01/10/2017 Goal Status: Active Interventions: Assess: immobility, friction, shearing, incontinence upon admission and as needed Assess impairment of mobility on admission and as needed per policy Notes: ` Nutrition Nursing Diagnoses: Imbalanced nutrition Potential for alteratiion in Nutrition/Potential for imbalanced nutrition Goals: Patient/caregiver agrees to and verbalizes understanding of need to use nutritional supplements and/or vitamins as prescribed Date Initiated: 09/26/2016 Target Resolution Date: 01/10/2017 Goal Status: Active Interventions: Assess patient nutrition upon admission and as needed per policy Notes: ` Orientation to the [name]. ([id number]) Nursing Diagnoses: Knowledge deficit related to the wound healing center program Goals: Patient/caregiver will  verbalize understanding of the [facility] Date Initiated: 09/26/2016 Target Resolution Date: 10/11/2016 Goal Status: Active Interventions: Provide education on orientation to the wound center Notes: ` Pain, Acute or Chronic Nursing Diagnoses: Pain, acute or chronic: actual or potential Potential alteration in comfort, pain Goals: Patient/caregiver will verbalize  adequate pain control between visits Date Initiated: 09/26/2016 Target Resolution Date: 01/10/2017 Goal Status: Active Interventions: Complete pain assessment as per visit requirements Notes: ` Pressure Nursing Diagnoses: Knowledge deficit related to causes and risk factors for pressure ulcer development Knowledge deficit related to management of pressures ulcers Potential for impaired tissue integrity related to pressure, friction, moisture, and shear Goals: Patient will remain free from development of additional pressure ulcers Date Initiated: 09/26/2016 Target Resolution Date: 01/10/2017 Goal Status: Active Interventions: Assess: immobility, friction, shearing, incontinence upon admission and as needed Assess potential for pressure ulcer upon admission and as needed HERMILO, DUTTER ([id number]) Provide education on pressure ulcers Notes: ` Wound/Skin Impairment Nursing Diagnoses: Impaired tissue integrity Knowledge deficit related to ulceration/compromised skin integrity Goals: Ulcer/skin breakdown will have a volume reduction of 80% by week 12 Date Initiated: 09/26/2016 Target Resolution Date: 01/03/2017 Goal Status: Active Interventions: Assess patient/caregiver ability to perform ulcer/skin care regimen upon admission and as needed Assess ulceration(s) every visit Notes: Electronic Signature(s) Signed: 10/31/2016 4:29:04 PM By: [name], BSN, RN, CWS, Kim RN, BSN Entered By: [name], BSN, RN, CWS, [name] on 10/31/2016 09:57:56 [name], [name] ([id number]) -------------------------------------------------------------------------------- Pain Assessment Details Patient Name: Jeremy Gang [name]. Date of Service: 10/31/2016 9:45 AM Medical Record Number: [id number] Patient Account Number: [id number] Date of Birth/Sex: 02-16-1941 (76 y.o. Male) Treating RN: [name] Primary Care Neytiri Asche: [name] Other Clinician: Referring Shiesha Jahn: [name] Treating Cacie Gaskins/Extender:  [name] in Treatment: 5 Active Problems Location of Pain Severity and Description of Pain Patient Has Paino No Site Locations With Dressing Change: No Pain Management and Medication Current Pain Management: Goals for Pain Management Topical or injectable lidocaine is offered to patient for acute pain when surgical debridement is performed. If needed, Patient is instructed to use over the counter pain medication for the following 24-48 hours after debridement. Wound care MDs do not prescribed pain medications. Patient has chronic pain or uncontrolled pain. Patient has been instructed to make an appointment with their Primary Care Physician for pain management. Electronic Signature(s) Signed: 10/31/2016 4:29:04 PM By: [name], BSN, RN, CWS, Kim RN, BSN Entered By: [name], BSN, RN, CWS, [name] on 10/31/2016 09:45:52 [name] ([id number]) -------------------------------------------------------------------------------- Patient/Caregiver Education Details Patient Name: TALAL, FRITCHMAN [name]. Date of Service: 10/31/2016 9:45 AM Medical Record Number: [id number] Patient Account Number: [id number] Date of Birth/Gender: 1940-07-22 (76 y.o. Male) Treating RN: [name] Primary Care Physician: [name] Other Clinician: Referring Physician: Sherrie [name] Treating Physician/Extender: [name] in Treatment: 5 Education Assessment Education Provided To: Patient Education Topics Provided Pressure: Handouts: Pressure Ulcers: Care and Offloading Methods: Demonstration Responses: State content correctly Electronic Signature(s) Signed: 10/31/2016 4:29:04 PM By: [name], BSN, RN, CWS, Kim RN, BSN Entered By: [name], BSN, RN, CWS, [name] on 10/31/2016 10:27:28 [name] ([id number]) -------------------------------------------------------------------------------- Wound Assessment Details Patient Name: [name], [name] Date of Service: 10/31/2016 9:45 AM Medical Record Number:  [id number] Patient Account Number: [id number] Date of Birth/Sex: 1940-11-19 (76 y.o. Male) Treating RN: [name] Primary Care Denae Zulueta: [name] Other Clinician: Referring Zaydon Kinser: [name] Treating Aniesha Haughn/Extender: [name] in Treatment: 5 Wound Status Wound Number: 2 Primary Pressure Ulcer Etiology: Wound Location: Left Calcaneus Wound Open  Wounding Event: Pressure Injury Status: Date Acquired: 05/29/2016 Comorbid Anemia, Chronic Obstructive Pulmonary Weeks Of Treatment: 5 History: Disease (COPD), Arrhythmia, Clustered Wound: No Hypertension, Dementia, Seizure Disorder Photos Wound Measurements Length: (cm) 1.5 Width: (cm) 0.2 Depth: (cm) 0.3 Area: (cm) 0.236 Volume: (cm) 0.071 % Reduction in Area: 84.6% % Reduction in Volume: 84.5% Epithelialization: None Tunneling: No Undermining: No Wound Description Classification: Category/Stage II Foul Odor Aft Wound Margin: Distinct, outline attached Slough/Fibrin Exudate Amount: Large Exudate Type: Serosanguineous Exudate Color: red, brown er Cleansing: No o Yes Wound Bed Granulation Amount: Large (67-100%) Exposed Structure Granulation Quality: Red, Pink Fascia Exposed: No Necrotic Amount: Small (1-33%) Fat Layer (Subcutaneous Tissue) Exposed: Yes Necrotic Quality: Adherent Slough Tendon Exposed: No Muscle Exposed: No Joint Exposed: No Burdin, Jomel [name]. ([id number]) Bone Exposed: No Periwound Skin Texture Texture Color No Abnormalities Noted: No No Abnormalities Noted: No Callus: Yes Atrophie Blanche: No Crepitus: No Cyanosis: No Excoriation: No Ecchymosis: No Induration: No Erythema: No Rash: No Hemosiderin Staining: No Scarring: No Mottled: No Pallor: No Moisture Rubor: No No Abnormalities Noted: No Dry / Scaly: No Temperature / Pain Maceration: No Temperature: No Abnormality Tenderness on Palpation: Yes Wound Preparation Ulcer Cleansing: Rinsed/Irrigated with  Saline Topical Anesthetic Applied: Other: lidocaine 4%, Treatment Notes Wound #2 (Left Calcaneus) 1. Cleansed with: Clean wound with Normal Saline 2. Anesthetic Topical Lidocaine 4% cream to wound bed prior to debridement 4. Dressing Applied: Prisma Ag 5. Secondary Dressing Applied ABD and Kerlix/Conform 6. Footwear/Offloading device applied Surgical shoe 7. Secured with Recruitment consultant) Signed: 10/31/2016 4:29:04 PM By: [name], BSN, RN, CWS, Kim RN, BSN Entered By: [name], BSN, RN, CWS, [name] on 10/31/2016 09:56:02 [name] ([id number]) -------------------------------------------------------------------------------- Wound Assessment Details Patient Name: [name], [name] Date of Service: 10/31/2016 9:45 AM Medical Record Number: [id number] Patient Account Number: [id number] Date of Birth/Sex: 23-Feb-1941 (76 y.o. Male) Treating RN: [name] Primary Care Zacharius Funari: [name] Other Clinician: Referring Mavi Un: [name] Treating Natalya Domzalski/Extender: [name] in Treatment: 5 Wound Status Wound Number: 3 Primary Trauma, Other Etiology: Wound Location: Left Foot - Plantar Wound Open Wounding Event: Gradually Appeared Status: Date Acquired: 09/12/2016 Comorbid Anemia, Chronic Obstructive Pulmonary Weeks Of Treatment: 5 History: Disease (COPD), Arrhythmia, Clustered Wound: No Hypertension, Dementia, Seizure Pending Amputation On Presentation Disorder Photos Wound Measurements Length: (cm) 0.7 Width: (cm) 0.2 Depth: (cm) 0.5 Area: (cm) 0.11 Volume: (cm) 0.055 % Reduction in Area: 93% % Reduction in Volume: 65% Epithelialization: None Wound Description Classification: Partial Thickness Foul Odor Afte Wound Margin: Distinct, outline attached Slough/Fibrino Exudate Amount: Large Exudate Type: Serosanguineous Exudate Color: red, brown r Cleansing: No Yes Wound Bed Granulation Amount: None Present (0%) Exposed  Structure Necrotic Amount: Large (67-100%) Fascia Exposed: No Necrotic Quality: Adherent Slough Fat Layer (Subcutaneous Tissue) Exposed: Yes Tendon Exposed: No Muscle Exposed: No Joint Exposed: No [name], [name] ([id number]) Bone Exposed: No Periwound Skin Texture Texture Color No Abnormalities Noted: No No Abnormalities Noted: No Callus: Yes Atrophie Blanche: No Crepitus: No Cyanosis: No Excoriation: No Ecchymosis: No Induration: No Erythema: No Rash: No Hemosiderin Staining: No Scarring: No Mottled: No Pallor: No Moisture Rubor: No No Abnormalities Noted: No Dry / Scaly: No Temperature / Pain Maceration: No Temperature: No Abnormality Tenderness on Palpation: Yes Wound Preparation Ulcer Cleansing: Rinsed/Irrigated with Saline Topical Anesthetic Applied: Other: lidocaine 4%, Treatment Notes Wound #3 (Left, Plantar Foot) 1. Cleansed with: Clean wound with Normal Saline 2. Anesthetic Topical Lidocaine 4% cream to wound bed prior to debridement 4. Dressing Applied:  Prisma Ag 5. Secondary Dressing Applied ABD and Kerlix/Conform 6. Footwear/Offloading device applied Surgical shoe 7. Secured with Recruitment consultant) Signed: 10/31/2016 4:29:04 PM By: [name], BSN, RN, CWS, Kim RN, BSN Entered By: [name], BSN, RN, CWS, [name] on 10/31/2016 09:56:24 [name] ([id number]) -------------------------------------------------------------------------------- Vitals Details Patient Name: [name], [name] Date of Service: 10/31/2016 9:45 AM Medical Record Number: [id number] Patient Account Number: [id number] Date of Birth/Sex: June 23, 1940 (76 y.o. Male) Treating RN: [name] Primary Care Estefania Kamiya: [name] Other Clinician: Referring Tracie Dore: [name] Treating Mailyn Steichen/Extender: [name] in Treatment: 5 Vital Signs Time Taken: 09:45 Temperature (F): 97.6 Weight (lbs): 123.8 Pulse (bpm): 42 Respiratory Rate (breaths/min): 16 Blood  Pressure (mmHg): 175/60 Reference Range: 80 - 120 mg / dl Electronic Signature(s) Signed: 10/31/2016 4:29:04 PM By: [name], BSN, RN, CWS, Kim RN, BSN Entered By: [name], BSN, RN, CWS, [name] on 10/31/2016 09:48:33

## 2016-11-03 NOTE — Progress Notes (Signed)
AIJALON, KIRTZ ([id number]) Visit Report for 10/31/2016 Chief Complaint Document Details Patient Name: [name], [name] Date of Service: 10/31/2016 9:45 AM Medical Record Number: [id number] Patient Account Number: [id number] Date of Birth/Sex: 1940/04/27 (76 y.o. Male) Treating RN: Cornell Barman Primary Care Provider: [name] Other Clinician: Referring Provider: [name] Treating Provider/Extender: Frann Rider in Treatment: 5 Information Obtained from: Patient Chief Complaint Patient is at the clinic for treatment of an open pressure ulcer the left heel and most recently has also developed a ulcer on the plantar aspect of his left foot near the fourth metatarsal head. Electronic Signature(s) Signed: 10/31/2016 10:12:39 AM By: Christin Fudge MD, FACS Entered By: Christin Fudge on 10/31/2016 10:12:39 [name], [name] ([id number]) -------------------------------------------------------------------------------- Debridement Details Patient Name: Jeremy Gang D. Date of Service: 10/31/2016 9:45 AM Medical Record Number: [id number] Patient Account Number: [id number] Date of Birth/Sex: 26-Aug-1940 (76 y.o. Male) Treating RN: Cornell Barman Primary Care Provider: [name] Other Clinician: Referring Provider: [name] Treating Provider/Extender: Frann Rider in Treatment: 5 Debridement Performed for Wound #2 Left Calcaneus Assessment: Performed By: Physician Christin Fudge, MD Debridement: Debridement Pre-procedure Verification/Time Out Yes - 10:03 Taken: Start Time: 10:04 Pain Control: Other : lidocaine 4% Level: Skin/Subcutaneous Tissue Total Area Debrided (L x 1.5 (cm) x 0.3 (cm) = 0.45 (cm) W): Tissue and other Viable, Non-Viable, Callus, Subcutaneous material debrided: Instrument: Curette Bleeding: Minimum Hemostasis Achieved: Pressure End Time: 10:07 Procedural Pain: 0 Post Procedural Pain: 0 Response to Treatment: Procedure was tolerated  well Post Debridement Measurements of Total Wound Length: (cm) 1.5 Stage: Category/Stage II Width: (cm) 0.3 Depth: (cm) 0.4 Volume: (cm) 0.141 Character of Wound/Ulcer Post Improved Debridement: Post Procedure Diagnosis Same as Pre-procedure Electronic Signature(s) Signed: 10/31/2016 10:12:19 AM By: Christin Fudge MD, FACS Signed: 10/31/2016 4:29:04 PM By: Gretta Cool, BSN, RN, CWS, Kim RN, BSN Entered By: Christin Fudge on 10/31/2016 10:12:18 [name], [name] ([id number]) -------------------------------------------------------------------------------- Debridement Details Patient Name: [name], [name] Date of Service: 10/31/2016 9:45 AM Medical Record Number: [id number] Patient Account Number: [id number] Date of Birth/Sex: 30-Jun-1940 (76 y.o. Male) Treating RN: Cornell Barman Primary Care Provider: [name] Other Clinician: Referring Provider: [name] Treating Provider/Extender: Frann Rider in Treatment: 5 Debridement Performed for Wound #3 Left,Plantar Foot Assessment: Performed By: Physician Christin Fudge, MD Debridement: Debridement Pre-procedure Verification/Time Out Yes - 10:03 Taken: Start Time: 10:04 Pain Control: Other : lidocaine 4% Level: Skin/Subcutaneous Tissue Total Area Debrided (L x 0.7 (cm) x 0.2 (cm) = 0.14 (cm) W): Tissue and other Viable, Non-Viable, Callus, Subcutaneous material debrided: Instrument: Curette Bleeding: Minimum Hemostasis Achieved: Pressure End Time: 10:07 Procedural Pain: 0 Post Procedural Pain: 0 Response to Treatment: Procedure was tolerated well Post Debridement Measurements of Total Wound Length: (cm) 0.7 Width: (cm) 0.2 Depth: (cm) 0.7 Volume: (cm) 0.077 Character of Wound/Ulcer Post Improved Debridement: Post Procedure Diagnosis Same as Pre-procedure Electronic Signature(s) Signed: 10/31/2016 10:12:27 AM By: Christin Fudge MD, FACS Signed: 10/31/2016 4:29:04 PM By: Gretta Cool, BSN, RN, CWS, Kim RN,  BSN Entered By: Christin Fudge on 10/31/2016 10:12:27 [name], [name] ([id number]) -------------------------------------------------------------------------------- HPI Details Patient Name: [name], [name] Date of Service: 10/31/2016 9:45 AM Medical Record Number: [id number] Patient Account Number: [id number] Date of Birth/Sex: Jul 31, 1940 (76 y.o. Male) Treating RN: Cornell Barman Primary Care Provider: [name] Other Clinician: Referring Provider: [name] Treating Provider/Extender: Frann Rider in Treatment: 5 History of Present Illness Location: left heel ulceration and left plantar foot ulcer Quality: Patient reports experiencing a dull pain to affected  area(s). Severity: Patient states wound are getting worse. Duration: Patient has had the wound for > 2 months prior to seeking treatment at the wound center Timing: Pain in wound is Intermittent (comes and goes Context: The wound appeared gradually over time Modifying Factors: Other treatment(s) tried include:local care and offloading Associated Signs and Symptoms: Patient reports having difficulty standing for long periods. HPI Description: 76 year old gentleman who is a reformed alcoholic for about 6 months now was referred to as by his PCP team for a left heel ulceration which she's had for about 2 months. He has a history of alcohol abuse, aneurysm, hypertensive renal disease, anemia of chronic kidney disease, atrial fibrillation with RVR, COPD, hypertension, seizure disorders, tobacco abuse, cerebral aneurysm repair in 1997 and some ice surgery. He continues to smoke about half a pack of cigarettes a day. the patient is not fully competent and has a part of attorney along with him today. 07/03/2016 -- x-ray of the left heel shows a soft tissue wound with no bony destruction or concerns for osteomyelitis. 07/10/2016 -- arterial studies are still pending. 07/30/2016 -- lower arterial examination done shows a right  ABI of 0.85 and a left ABI of 0.59. 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 Bilateral CFA stenosis. Occluded left mid distal SFA. Three-vessel runoff bilaterally. The bilateral great toe pressures were also abnormal in the 10 digit toe PPGos are abnormal. PV consult suggested -- to see Dr. Fletcher Anon. appointment scheduled for this coming Tuesday 08/07/2016 -- was seen in the office by Dr. Annia Belt. He reviewed his arterial studies with multilevel disease including iliac and SFA and recommended an abdominal aortogram, lower extremity runoff and possible endovascular intervention. 09/26/16 patient is seen for evaluation today for his left heel wound which we were treating for him prior to his admission to the hospital and then subsequent admission to a skilled nursing facility. Unfortunately he also has a new wound on the plantar surface of his left foot which is new and appears to have been facility acquired. He is having some discomfort he tells me but then does not seem to experience any pain with palpation over the wound. 10/03/2016 -- since I saw him last in early May, he had a vascular balloon angioplasty with an angiogram done by Dr. Fletcher Anon on 08/13/2016. he was found to have a short occlusion of the left distal SFA with heavy calcification and a 3 vessel runoff below the knee. Unsuccessful attempt at angioplasty of the left SFA due Blaydes, Micha D. ([id number]) to inability to cross the occlusion. He had recommended an angioplasty of the left SFA via retrograde anterior tibial axis if the patient is agreeable. The patient was later admitted between May 14 to May 19, with the discharge diagnosis of seizures, tobacco use disorder, essential hypertension, peripheral vascular disease, chronic kidney disease, lactic acidosis and pressure injury of skin. He was treated for a  community-acquired pneumonia and then discharged to a skilled nursing facility. Oncology was working him up for follow-up of multiple myeloma. At follow-up Dr. 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 He would give him appropriate symptomatic treatment and follow-up in 2 months. Dr. Fletcher Anon saw him again on 09/09/2016 and increased his dose of Lasix to 40 mg twice daily. Since the ulcer was progressing well he did not plan to perform revascularization and would  follow him up in 3 months. 10/10/16 on evaluation today patient appears to be doing better in regard to his left foot wounds. Both are improved compared to when I last saw him on evaluation. There is no evidence of infection 10/24/2016 -- had a left foot x-ray which showed soft tissue changes without acute bony abnormality. Electronic Signature(s) Signed: 10/31/2016 10:12:46 AM By: Christin Fudge MD, FACS Entered By: Christin Fudge on 10/31/2016 10:12:46 [name], Jeremy DMarland Kitchen ([id number]) -------------------------------------------------------------------------------- Physical Exam Details Patient Name: [name], [name] Date of Service: 10/31/2016 9:45 AM Medical Record Number: [id number] Patient Account Number: [id number] Date of Birth/Sex: 1940/11/22 (75 y.o. Male) Treating RN: Cornell Barman Primary Care Provider: [name] Other Clinician: Referring Provider: [name] Treating Provider/Extender: Frann Rider in Treatment: 5 Constitutional . Pulse regular. Respirations normal and unlabored. Afebrile. . Eyes Nonicteric. Reactive to light. Ears, Nose, Mouth, and Throat Lips, teeth, and gums WNL.Marland Kitchen Moist mucosa without lesions. Neck supple and nontender. No palpable supraclavicular or cervical adenopathy. Normal sized without goiter. Respiratory WNL. No retractions.. Cardiovascular Pedal Pulses WNL. No clubbing, cyanosis or edema. Lymphatic No  adneopathy. No adenopathy. No adenopathy. Musculoskeletal Adexa without tenderness or enlargement.. Digits and nails w/o clubbing, cyanosis, infection, petechiae, ischemia, or inflammatory conditions.. Integumentary (Hair, Skin) No suspicious lesions. No crepitus or fluctuance. No peri-wound warmth or erythema. No masses.Marland Kitchen Psychiatric Judgement and insight Intact.. No evidence of depression, anxiety, or agitation.. Notes sharp debridement was done for the wounds on the left plantar foot and the left heel and both of them are looking extremely good today after removing eschar and subcutaneous debris. Electronic Signature(s) Signed: 10/31/2016 10:13:32 AM By: Christin Fudge MD, FACS Entered By: Christin Fudge on 10/31/2016 10:13:32 [name], [name] ([id number]) -------------------------------------------------------------------------------- Physician Orders Details Patient Name: Jeremy Gang D. Date of Service: 10/31/2016 9:45 AM Medical Record Number: [id number] Patient Account Number: [id number] Date of Birth/Sex: 07-15-1940 (76 y.o. Male) Treating RN: Cornell Barman Primary Care Provider: [name] Other Clinician: Referring Provider: [name] Treating Provider/Extender: Frann Rider in Treatment: 5 Verbal / Phone Orders: No Diagnosis Coding Wound Cleansing Wound #2 Left Calcaneus o Clean wound with Normal Saline. o Cleanse wound with mild soap and water Wound #3 Left,Plantar Foot o Clean wound with Normal Saline. o Cleanse wound with mild soap and water Anesthetic Wound #2 Left Calcaneus o Topical Lidocaine 4% cream applied to wound bed prior to debridement Wound #3 Left,Plantar Foot o Topical Lidocaine 4% cream applied to wound bed prior to debridement Primary Wound Dressing Wound #2 Left Calcaneus o Prisma Ag Wound #3 Left,Plantar Foot o Prisma Ag Secondary Dressing Wound #2 Left Calcaneus o ABD and Kerlix/Conform Wound #3 Left,Plantar  Foot o ABD and Kerlix/Conform Dressing Change Frequency Wound #2 Left Calcaneus o Three times weekly Wound #3 Left,Plantar Foot o Three times weekly [name], [name] ([id number]) Follow-up Appointments o Return Appointment in 1 week. Edema Control Wound #2 Left Calcaneus o Elevate legs to the level of the heart and pump ankles as often as possible Wound #3 Left,Plantar Foot o Elevate legs to the level of the heart and pump ankles as often as possible Off-Loading Wound #2 Left Calcaneus o Open toe surgical shoe to: Wound #3 Left,Plantar Foot o Open toe surgical shoe to: Home Health Wound #2 Left [place] Nurse may visit PRN to address patientos wound care needs. o FACE TO FACE ENCOUNTER: MEDICARE and MEDICAID PATIENTS: I certify that this patient is under  my care and that I had a face-to-face encounter that meets the physician face-to-face encounter requirements with this patient on this date. The encounter with the patient was in whole or in part for the following MEDICAL CONDITION: (primary reason for [place]) MEDICAL NECESSITY: I certify, that based on my findings, NURSING services are a medically necessary home health service. HOME BOUND STATUS: I certify that my clinical findings support that this patient is homebound (i.e., Due to illness or injury, pt requires aid of supportive devices such as crutches, cane, wheelchairs, walkers, the use of special transportation or the assistance of another person to leave their place of residence. There is a normal inability to leave the home and doing so requires considerable and taxing effort. Other absences are for medical reasons / religious services and are infrequent or of short duration when for other reasons). o If current dressing causes regression in wound condition, may D/C ordered dressing product/s and apply Normal Saline Moist Dressing  daily until next [place] / Other MD appointment. [place] of regression in wound condition at [phone]. o Please direct any NON-WOUND related issues/requests for orders to patient's Primary Care Physician Wound #3 [place] Nurse may visit PRN to address patientos wound care needs. o FACE TO FACE ENCOUNTER: MEDICARE and MEDICAID PATIENTS: I certify that this patient is under my care and that I had a face-to-face encounter that meets the physician face-to-face encounter requirements with this patient on this date. The encounter with the patient was in whole or in part for the following MEDICAL CONDITION: (primary reason for [place]) MEDICAL NECESSITY: I certify, that based on my findings, NURSING services are a medically necessary home health service. HOME BOUND STATUS: I certify that my clinical findings LAYSON, BERTSCH. ([id number]) support that this patient is homebound (i.e., Due to illness or injury, pt requires aid of supportive devices such as crutches, cane, wheelchairs, walkers, the use of special transportation or the assistance of another person to leave their place of residence. There is a normal inability to leave the home and doing so requires considerable and taxing effort. Other absences are for medical reasons / religious services and are infrequent or of short duration when for other reasons). o If current dressing causes regression in wound condition, may D/C ordered dressing product/s and apply Normal Saline Moist Dressing daily until next [place] / Other MD appointment. [place] of regression in wound condition at [phone]. o Please direct any NON-WOUND related issues/requests for orders to patient's Primary Care Physician Electronic Signature(s) Signed: 10/31/2016 3:58:43 PM By: Christin Fudge MD, FACS Signed: 10/31/2016  4:29:04 PM By: Gretta Cool, BSN, RN, CWS, Kim RN, BSN Entered By: Gretta Cool, BSN, RN, CWS, Kim on 10/31/2016 10:11:10 JERREN, FLINCHBAUGH ([id number]) -------------------------------------------------------------------------------- Problem List Details Patient Name: DARELLE, KINGS. Date of Service: 10/31/2016 9:45 AM Medical Record Number: [id number] Patient Account Number: [id number] Date of Birth/Sex: 18-May-1940 (76 y.o. Male) Treating RN: Cornell Barman Primary Care Provider: [name] Other Clinician: Referring Provider: [name] Treating Provider/Extender: Frann Rider in Treatment: 5 Active Problems ICD-10 Encounter Code Description Active Date Diagnosis [phone] Pressure ulcer of left heel, stage 4 09/26/2016 Yes I70.245 Atherosclerosis of native arteries of left leg with ulceration 09/26/2016 Yes of other part of foot L97.522 Non-pressure chronic ulcer of other part of left foot with fat 09/26/2016 Yes layer exposed F17.218 Nicotine dependence, cigarettes, with other  nicotine- 10/03/2016 Yes induced disorders Inactive Problems Resolved Problems Electronic Signature(s) Signed: 10/31/2016 10:11:52 AM By: Christin Fudge MD, FACS Entered By: Christin Fudge on 10/31/2016 10:11:52 WESLIE, PRETLOW D. ([id number]) -------------------------------------------------------------------------------- Progress Note Details Patient Name: Jeremy Gang D. Date of Service: 10/31/2016 9:45 AM Medical Record Number: [id number] Patient Account Number: [id number] Date of Birth/Sex: 09-28-1940 (76 y.o. Male) Treating RN: Cornell Barman Primary Care Provider: [name] Other Clinician: Referring Provider: [name] Treating Provider/Extender: Frann Rider in Treatment: 5 Subjective Chief Complaint Information obtained from Patient Patient is at the clinic for treatment of an open pressure ulcer the left heel and most recently has also developed a ulcer on the plantar aspect of his left  foot near the fourth metatarsal head. History of Present Illness (HPI) The following HPI elements were documented for the patient's wound: Location: left heel ulceration and left plantar foot ulcer Quality: Patient reports experiencing a dull pain to affected area(s). Severity: Patient states wound are getting worse. Duration: Patient has had the wound for > 2 months prior to seeking treatment at the wound center Timing: Pain in wound is Intermittent (comes and goes Context: The wound appeared gradually over time Modifying Factors: Other treatment(s) tried include:local care and offloading Associated Signs and Symptoms: Patient reports having difficulty standing for long periods. 76 year old gentleman who is a reformed alcoholic for about 6 months now was referred to as by his PCP team for a left heel ulceration which she's had for about 2 months. He has a history of alcohol abuse, aneurysm, hypertensive renal disease, anemia of chronic kidney disease, atrial fibrillation with RVR, COPD, hypertension, seizure disorders, tobacco abuse, cerebral aneurysm repair in 1997 and some ice surgery. He continues to smoke about half a pack of cigarettes a day. the patient is not fully competent and has a part of attorney along with him today. 07/03/2016 -- x-ray of the left heel shows a soft tissue wound with no bony destruction or concerns for osteomyelitis. 07/10/2016 -- arterial studies are still pending. 07/30/2016 -- lower arterial examination done shows a right ABI of 0.85 and a left ABI of 0.59. 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 Bilateral CFA stenosis. Occluded left mid distal SFA. Three-vessel runoff bilaterally. The bilateral great toe pressures were also abnormal in the 10 digit toe PPG s are abnormal. PV consult suggested -- to see Dr. Fletcher Anon. appointment scheduled for this  coming Tuesday 08/07/2016 -- was seen in the office by Dr. Annia Belt. He reviewed his arterial studies with multilevel disease including iliac and SFA and recommended an abdominal aortogram, lower extremity runoff and possible endovascular intervention. RONTRELL, MOQUIN ([id number]) 09/26/16 patient is seen for evaluation today for his left heel wound which we were treating for him prior to his admission to the hospital and then subsequent admission to a skilled nursing facility. Unfortunately he also has a new wound on the plantar surface of his left foot which is new and appears to have been facility acquired. He is having some discomfort he tells me but then does not seem to experience any pain with palpation over the wound. 10/03/2016 -- since I saw him last in early May, he had a vascular balloon angioplasty with an angiogram done by Dr. Fletcher Anon on 08/13/2016. he was found to have a short occlusion of the left distal SFA with heavy calcification and a 3 vessel runoff below the knee.  Unsuccessful attempt at angioplasty of the left SFA due to inability to cross the occlusion. He had recommended an angioplasty of the left SFA via retrograde anterior tibial axis if the patient is agreeable. The patient was later admitted between May 14 to May 19, with the discharge diagnosis of seizures, tobacco use disorder, essential hypertension, peripheral vascular disease, chronic kidney disease, lactic acidosis and pressure injury of skin. He was treated for a community-acquired pneumonia and then discharged to a skilled nursing facility. Oncology was working him up for follow-up of multiple myeloma. At follow-up Dr. 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 He would give him appropriate symptomatic treatment and follow-up in 2 months. Dr. Fletcher Anon saw him again on 09/09/2016 and increased his dose of Lasix to 40 mg twice daily.  Since the ulcer was progressing well he did not plan to perform revascularization and would follow him up in 3 months. 10/10/16 on evaluation today patient appears to be doing better in regard to his left foot wounds. Both are improved compared to when I last saw him on evaluation. There is no evidence of infection 10/24/2016 -- had a left foot x-ray which showed soft tissue changes without acute bony abnormality. Objective Constitutional Pulse regular. Respirations normal and unlabored. Afebrile. Vitals Time Taken: 9:45 AM, Weight: 123.8 lbs, Temperature: 97.6 F, Pulse: 42 bpm, Respiratory Rate: 16 breaths/min, Blood Pressure: 175/60 mmHg. Eyes Nonicteric. Reactive to light. Ears, Nose, Mouth, and Throat Lips, teeth, and gums WNL.Marland Kitchen Moist mucosa without lesions. [name], [name] ([id number]) Neck supple and nontender. No palpable supraclavicular or cervical adenopathy. Normal sized without goiter. Respiratory WNL. No retractions.. Cardiovascular Pedal Pulses WNL. No clubbing, cyanosis or edema. Lymphatic No adneopathy. No adenopathy. No adenopathy. Musculoskeletal Adexa without tenderness or enlargement.. Digits and nails w/o clubbing, cyanosis, infection, petechiae, ischemia, or inflammatory conditions.Marland Kitchen Psychiatric Judgement and insight Intact.. No evidence of depression, anxiety, or agitation.. General Notes: sharp debridement was done for the wounds on the left plantar foot and the left heel and both of them are looking extremely good today after removing eschar and subcutaneous debris. Integumentary (Hair, Skin) No suspicious lesions. No crepitus or fluctuance. No peri-wound warmth or erythema. No masses.. Wound #2 status is Open. Original cause of wound was Pressure Injury. The wound is located on the Left Calcaneus. The wound measures 1.5cm length x 0.2cm width x 0.3cm depth; 0.236cm^2 area and 0.071cm^3 volume. There is Fat Layer (Subcutaneous Tissue) Exposed exposed. There is  no tunneling or undermining noted. There is a large amount of serosanguineous drainage noted. The wound margin is distinct with the outline attached to the wound base. There is large (67-100%) red, pink granulation within the wound bed. There is a small (1-33%) amount of necrotic tissue within the wound bed including Adherent Slough. The periwound skin appearance exhibited: Callus. The periwound skin appearance did not exhibit: Crepitus, Excoriation, Induration, Rash, Scarring, Dry/Scaly, Maceration, Atrophie Blanche, Cyanosis, Ecchymosis, Hemosiderin Staining, Mottled, Pallor, Rubor, Erythema. Periwound temperature was noted as No Abnormality. The periwound has tenderness on palpation. Wound #3 status is Open. Original cause of wound was Gradually Appeared. The wound is located on the [place]. The wound measures 0.7cm length x 0.2cm width x 0.5cm depth; 0.11cm^2 area and 0.055cm^3 volume. There is Fat Layer (Subcutaneous Tissue) Exposed exposed. There is a large amount of serosanguineous drainage noted. The wound margin is distinct with the outline attached to the wound base. There is no granulation within  the wound bed. There is a large (67-100%) amount of necrotic tissue within the wound bed including Adherent Slough. The periwound skin appearance exhibited: Callus. The periwound skin appearance did not exhibit: Crepitus, Excoriation, Induration, Rash, Scarring, Dry/Scaly, Maceration, Atrophie Blanche, Cyanosis, Ecchymosis, Hemosiderin Staining, Mottled, Pallor, Rubor, Erythema. Periwound temperature was noted as No Abnormality. The periwound has tenderness on palpation. 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 There was a Skin/Subcutaneous Tissue Debridement ([id number]) debridement with total area of 0.45 sq cm performed by Christin Fudge, MD. with the following instrument(s): Curette to remove Viable and Non-Viable tissue/material including Callus and Subcutaneous after achieving pain control using Other (lidocaine 4%). A time out was conducted at 10:03, prior to the start of the procedure. A Minimum amount of bleeding was controlled with Pressure. The procedure was tolerated well with a pain level of 0 throughout and a pain level of 0 following the procedure. Post Debridement Measurements: 1.5cm length x 0.3cm width x 0.4cm depth; 0.141cm^3 volume. Post debridement Stage noted as Category/Stage II. Character of Wound/Ulcer Post Debridement is improved. Post procedure Diagnosis Wound #2: Same as Pre-Procedure Wound #3 Pre-procedure diagnosis of Wound #3 is a Trauma, Other located on the [place] . There was a Skin/Subcutaneous Tissue Debridement ([id number]) debridement with total area of 0.14 sq cm performed by Christin Fudge, MD. with the following instrument(s): Curette to remove Viable and Non-Viable tissue/material including Callus and Subcutaneous after achieving pain control using Other (lidocaine 4%). A time out was conducted at 10:03, prior to the start of the procedure. A Minimum amount of bleeding was controlled with Pressure. The procedure was tolerated well with a pain level of 0 throughout and a pain level of 0 following the procedure. Post Debridement Measurements: 0.7cm length x 0.2cm width x 0.7cm depth; 0.077cm^3 volume. Character of Wound/Ulcer Post Debridement is improved. Post procedure Diagnosis Wound #3: Same as Pre-Procedure Tillis, Ridley D. ([id number]) Plan Wound Cleansing: Wound #2 Left  Calcaneus: Clean wound with Normal Saline. Cleanse wound with mild soap and water Wound #3 Left,Plantar Foot: Clean wound with Normal Saline. Cleanse wound with mild soap and water Anesthetic: Wound #2 Left Calcaneus: Topical Lidocaine 4% cream applied to wound bed prior to debridement Wound #3 Left,Plantar Foot: Topical Lidocaine 4% cream applied to wound bed prior to debridement Primary Wound Dressing: Wound #2 Left Calcaneus: Prisma Ag Wound #3 Left,Plantar Foot: Prisma Ag Secondary Dressing: Wound #2 Left Calcaneus: ABD and Kerlix/Conform Wound #3 Left,Plantar Foot: ABD and Kerlix/Conform Dressing Change Frequency: Wound #2 Left Calcaneus: Three times weekly Wound #3 Left,Plantar Foot: Three times weekly Follow-up Appointments: Return Appointment in 1 week. Edema Control: Wound #2 Left Calcaneus: Elevate legs to the level of the heart and pump ankles as often as possible Wound #3 Left,Plantar Foot: Elevate legs to the level of the heart and pump ankles as often as possible Off-Loading: Wound #2 Left Calcaneus: Open toe surgical shoe to: Wound #3 Left,Plantar Foot: Open toe surgical shoe to: Home Health: Wound #2 Left Calcaneus: Continue Home Health Visits - [facility] Nurse may visit PRN to address patient s wound  care needs. FACE TO FACE ENCOUNTER: MEDICARE and MEDICAID PATIENTS: I certify that this patient is under my care and that I had a face-to-face encounter that meets the physician face-to-face encounter requirements with this patient on this date. The encounter with the patient was in whole or in part for the following MEDICAL CONDITION: (primary reason for [place]) MEDICAL NECESSITY: I certify, TRAVARIUS, LANGE ([id number]) that based on my findings, NURSING services are a medically necessary home health service. HOME BOUND STATUS: I certify that my clinical findings support that this patient is homebound (i.e., Due to illness or injury,  pt requires aid of supportive devices such as crutches, cane, wheelchairs, walkers, the use of special transportation or the assistance of another person to leave their place of residence. There is a normal inability to leave the home and doing so requires considerable and taxing effort. Other absences are for medical reasons / religious services and are infrequent or of short duration when for other reasons). If current dressing causes regression in wound condition, may D/C ordered dressing product/s and apply Normal Saline Moist Dressing daily until next [place] / Other MD appointment. [place] of regression in wound condition at [phone]. Please direct any NON-WOUND related issues/requests for orders to patient's Primary Care Physician Wound #3 Left,Plantar Foot: [place] Nurse may visit PRN to address patient s wound care needs. FACE TO FACE ENCOUNTER: MEDICARE and MEDICAID PATIENTS: I certify that this patient is under my care and that I had a face-to-face encounter that meets the physician face-to-face encounter requirements with this patient on this date. The encounter with the patient was in whole or in part for the following MEDICAL CONDITION: (primary reason for [place]) MEDICAL NECESSITY: I certify, that based on my findings, NURSING services are a medically necessary home health service. HOME BOUND STATUS: I certify that my clinical findings support that this patient is homebound (i.e., Due to illness or injury, pt requires aid of supportive devices such as crutches, cane, wheelchairs, walkers, the use of special transportation or the assistance of another person to leave their place of residence. There is a normal inability to leave the home and doing so requires considerable and taxing effort. Other absences are for medical reasons / religious services and are infrequent or of short duration when  for other reasons). If current dressing causes regression in wound condition, may D/C ordered dressing product/s and apply Normal Saline Moist Dressing daily until next [place] / Other MD appointment. [place] of regression in wound condition at [phone]. Please direct any NON-WOUND related issues/requests for orders to patient's Primary Care Physician After sharp debridement and review today I have recommended: 1. silver collagen to both wounds and offloading has been discussed in great detail. 2. Adequate protein, vitamin A, vitamin C and zinc 3. Regular visits to the wound center Electronic Signature(s) Signed: 10/31/2016 10:15:13 AM By: Christin Fudge MD, FACS Entered By: Christin Fudge on 10/31/2016 10:15:13 ARLIE, RIKER D. ([id number]) -------------------------------------------------------------------------------- SuperBill Details Patient Name: Jeremy Gang D. Date of Service: 10/31/2016 Medical Record Number: [id number] Patient Account Number: [id number] Date of Birth/Sex: [birth year] (75 y.o. Male) Treating RN: Cornell Barman Primary Care Provider: [name] Other Clinician: Referring Provider: [name] Treating Provider/Extender: Frann Rider in Treatment: 5 Diagnosis Coding ICD-10 Codes Code Description [phone] Pressure ulcer of left heel, stage 4 I70.245 Atherosclerosis of native arteries of left leg with ulceration of other  part of foot L97.522 Non-pressure chronic ulcer of other part of left foot with fat layer exposed F17.218 Nicotine dependence, cigarettes, with other nicotine-induced disorders Facility Procedures CPT4: Description Modifier Quantity Code [id number] 11042 - DEB SUBQ TISSUE 20 SQ CM/< 1 ICD-10 Description Diagnosis 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, 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  L97.522 Non-pressure chronic ulcer of other part of left  foot with fat layer exposed F17.218 Nicotine dependence, cigarettes, with other nicotine-induced disorders Physician Procedures CPT4: Description Modifier Quantity Code [id number] 11042 - WC PHYS SUBQ TISS 20 SQ CM 1 ICD-10 Description Diagnosis 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, 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 ([id number]) Entered By: Christin Fudge on 10/31/2016 10:15:34

## 2016-11-07 ENCOUNTER — Ambulatory Visit: Payer: Medicare Other | Admitting: Surgery

## 2016-11-13 ENCOUNTER — Other Ambulatory Visit: Payer: Self-pay | Admitting: Nurse Practitioner

## 2016-11-14 ENCOUNTER — Encounter: Payer: Medicare Other | Attending: Surgery | Admitting: Physician Assistant

## 2016-11-14 DIAGNOSIS — L89624 Pressure ulcer of left heel, stage 4: Secondary | ICD-10-CM | POA: Insufficient documentation

## 2016-11-14 DIAGNOSIS — I4891 Unspecified atrial fibrillation: Secondary | ICD-10-CM | POA: Insufficient documentation

## 2016-11-14 DIAGNOSIS — D631 Anemia in chronic kidney disease: Secondary | ICD-10-CM | POA: Diagnosis not present

## 2016-11-14 DIAGNOSIS — I70245 Atherosclerosis of native arteries of left leg with ulceration of other part of foot: Secondary | ICD-10-CM | POA: Diagnosis present

## 2016-11-14 DIAGNOSIS — L97522 Non-pressure chronic ulcer of other part of left foot with fat layer exposed: Secondary | ICD-10-CM | POA: Diagnosis not present

## 2016-11-14 DIAGNOSIS — Z880 Allergy status to penicillin: Secondary | ICD-10-CM | POA: Diagnosis not present

## 2016-11-14 DIAGNOSIS — I129 Hypertensive chronic kidney disease with stage 1 through stage 4 chronic kidney disease, or unspecified chronic kidney disease: Secondary | ICD-10-CM | POA: Diagnosis not present

## 2016-11-14 DIAGNOSIS — N189 Chronic kidney disease, unspecified: Secondary | ICD-10-CM | POA: Insufficient documentation

## 2016-11-14 DIAGNOSIS — G40909 Epilepsy, unspecified, not intractable, without status epilepticus: Secondary | ICD-10-CM | POA: Diagnosis not present

## 2016-11-14 DIAGNOSIS — F17218 Nicotine dependence, cigarettes, with other nicotine-induced disorders: Secondary | ICD-10-CM | POA: Insufficient documentation

## 2016-11-14 DIAGNOSIS — F0390 Unspecified dementia without behavioral disturbance: Secondary | ICD-10-CM | POA: Diagnosis not present

## 2016-11-20 ENCOUNTER — Other Ambulatory Visit: Payer: Self-pay

## 2016-11-20 DIAGNOSIS — R600 Localized edema: Secondary | ICD-10-CM

## 2016-11-20 MED ORDER — FUROSEMIDE 40 MG PO TABS: 40.0000 mg | ORAL_TABLET | Freq: Every day | ORAL | 3 refills | Status: DC

## 2016-11-20 NOTE — Progress Notes (Signed)
[name] ([id number]) Visit Report for 11/14/2016 Chief Complaint Document Details Patient Name: [name], [name] Date of Service: 11/14/2016 11:00 AM Medical Record Number: [id number] Patient Account Number: [id number] Date of Birth/Sex: 1940/12/22 (76 y.o. Male) Treating RN: Montey Hora Primary Care Provider: [name] Other Clinician: Referring Provider: [name] Treating Provider/Extender: Melburn Hake,  Weeks in Treatment: 7 Information Obtained from: Patient Chief Complaint Patient is at the clinic for treatment of an open pressure ulcer the left heel and most recently has also developed a ulcer on the plantar aspect of his left foot near the fourth metatarsal head. Electronic Signature(s) Signed: 11/18/2016 5:30:37 PM By: Gretta Cool, BSN, RN, CWS, Kim RN, BSN Signed: 11/19/2016 1:25:22 AM By: Worthy Keeler [name] Previous Signature: 11/17/2016 8:34:49 AM Version By: Worthy Keeler [name] Entered By: Gretta Cool BSN, RN, CWS, Kim on 11/18/2016 17:29:02 [name], [name] ([id number]) -------------------------------------------------------------------------------- Debridement Details Patient Name: [name], [name] Date of Service: 11/14/2016 11:00 AM Medical Record Number: [id number] Patient Account Number: [id number] Date of Birth/Sex: June 06, 1940 (76 y.o. Male) Treating RN: Cornell Barman Primary Care Provider: [name] Other Clinician: Referring Provider: [name] Treating Provider/Extender: Melburn Hake,  Weeks in Treatment: 7 Debridement Performed for Wound #3 Left,Plantar Foot Assessment: Performed By: Physician [name],  E., [name] Debridement: Open Wound/Selective Debridement Selective Description: Pre-procedure Verification/Time Out Yes - 11:23 Taken: Start Time: 11:24 Pain Control: Lidocaine 4% Topical Solution Level: Non-Viable Tissue Total Area Debrided (L x 0.2 (cm) x 0.2 (cm) = 0.04 (cm) W): Tissue and other Viable, Non-Viable,  Exudate, Fibrin/Slough, Subcutaneous material debrided: Instrument: Curette Bleeding: Minimum Hemostasis Achieved: Pressure End Time: 11:28 Procedural Pain: 0 Post Procedural Pain: 0 Response to Treatment: Procedure was tolerated well Post Debridement Measurements of Total Wound Length: (cm) 0.3 Width: (cm) 0.2 Depth: (cm) 0.2 Volume: (cm) 0.009 Character of Wound/Ulcer Post Requires Further Debridement Debridement: Post Procedure Diagnosis Same as Pre-procedure Electronic Signature(s) Signed: 11/18/2016 5:30:37 PM By: Gretta Cool, BSN, RN, CWS, Kim RN, BSN Signed: 11/19/2016 1:25:22 AM By: Worthy Keeler [name] Previous Signature: 11/14/2016 4:51:41 PM Version By: Karren Burly ([id number]) Entered By: Gretta Cool, BSN, RN, CWS, Kim on 11/18/2016 17:28:52 [name], [name] ([id number]) -------------------------------------------------------------------------------- HPI Details Patient Name: [name], [name] Date of Service: 11/14/2016 11:00 AM Medical Record Number: [id number] Patient Account Number: [id number] Date of Birth/Sex: 1940-07-30 (76 y.o. Male) Treating RN: Montey Hora Primary Care Provider: [name] Other Clinician: Referring Provider: [name] Treating Provider/Extender: Melburn Hake,  Weeks in Treatment: 7 History of Present Illness Location: left heel ulceration and left plantar foot ulcer Quality: Patient reports experiencing a dull pain to affected area(s). Severity: Patient states wound are getting worse. Duration: Patient has had the wound for > 2 months prior to seeking treatment at the wound center Timing: Pain in wound is Intermittent (comes and goes Context: The wound appeared gradually over time Modifying Factors: Other treatment(s) tried include:local care and offloading Associated Signs and Symptoms: Patient reports having difficulty standing for long periods. HPI Description: 76 year old gentleman who is a reformed alcoholic for  about 6 months now was referred to as by his PCP team for a left heel ulceration which she's had for about 2 months. He has a history of alcohol abuse, aneurysm, hypertensive renal disease, anemia of chronic kidney disease, atrial fibrillation with RVR, COPD, hypertension, seizure disorders, tobacco abuse, cerebral aneurysm repair in 1997 and some ice surgery. He continues to smoke about half a pack of cigarettes a day. the patient is  not fully competent and has a part of attorney along with him today. 07/03/2016 -- x-ray of the left heel shows a soft tissue wound with no bony destruction or concerns for osteomyelitis. 07/10/2016 -- arterial studies are still pending. 07/30/2016 -- lower arterial examination done shows a right ABI of 0.85 and a left ABI of 0.59. 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 Bilateral CFA stenosis. Occluded left mid distal SFA. Three-vessel runoff bilaterally. The bilateral great toe pressures were also abnormal in the 10 digit toe PPGos are abnormal. PV consult suggested -- to see Dr. Fletcher Anon. appointment scheduled for this coming Tuesday 08/07/2016 -- was seen in the office by Dr. Annia Belt. He reviewed his arterial studies with multilevel disease including iliac and SFA and recommended an abdominal aortogram, lower extremity runoff and possible endovascular intervention. 09/26/16 patient is seen for evaluation today for his left heel wound which we were treating for him prior to his admission to the hospital and then subsequent admission to a skilled nursing facility. Unfortunately he also has a new wound on the plantar surface of his left foot which is new and appears to have been facility acquired. He is having some discomfort he tells me but then does not seem to experience any pain with palpation over the wound. 10/03/2016 -- since I saw him  last in early May, he had a vascular balloon angioplasty with an angiogram done by Dr. Fletcher Anon on 08/13/2016. he was found to have a short occlusion of the left distal SFA with heavy calcification and a 3 vessel runoff below the knee. Unsuccessful attempt at angioplasty of the left SFA due Marchetti, Destin D. ([id number]) to inability to cross the occlusion. He had recommended an angioplasty of the left SFA via retrograde anterior tibial axis if the patient is agreeable. The patient was later admitted between May 14 to May 19, with the discharge diagnosis of seizures, tobacco use disorder, essential hypertension, peripheral vascular disease, chronic kidney disease, lactic acidosis and pressure injury of skin. He was treated for a community-acquired pneumonia and then discharged to a skilled nursing facility. Oncology was working him up for follow-up of multiple myeloma. At follow-up Dr. 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 He would give him appropriate symptomatic treatment and follow-up in 2 months. Dr. Fletcher Anon saw him again on 09/09/2016 and increased his dose of Lasix to 40 mg twice daily. Since the ulcer was progressing well he did not plan to perform revascularization and would follow him up in 3 months. 10/10/16 on evaluation today patient appears to be doing better in regard to his left foot wounds. Both are improved compared to when I last saw him on evaluation. There is no evidence of infection 10/24/2016 -- had a left foot x-ray which showed soft tissue changes without acute bony abnormality. 11/14/16 on evaluation today patient appears to be doing very well in regard to his left heel which is actually healed at this point. Subsequently the plantar surface of his forefoot on the left headlight callus surrounding the opening appears to be doing well and is improving. Fortunately patient does not seem to be having  any significant pain in regard to the forefoot location. No fevers, chills, nausea, or vomiting noted at this time. Electronic Signature(s) Signed: 11/18/2016 5:30:37 PM By: Gretta Cool, BSN, RN, CWS, Kim RN, BSN Signed: 11/19/2016  1:25:22 AM By: Worthy Keeler [name] Previous Signature: 11/17/2016 8:34:49 AM Version By: Worthy Keeler [name] Entered By: Gretta Cool BSN, RN, CWS, Kim on 11/18/2016 17:29:08 [name] ([id number]) -------------------------------------------------------------------------------- Physical Exam Details Patient Name: [name], SPIKER D. Date of Service: 11/14/2016 11:00 AM Medical Record Number: [id number] Patient Account Number: [id number] Date of Birth/Sex: Aug 10, 1940 (76 y.o. Male) Treating RN: Montey Hora Primary Care Provider: [name] Other Clinician: Referring Provider: [name] Treating Provider/Extender: [name],  Weeks in Treatment: 7 Constitutional Well-nourished and well-hydrated in no acute distress. Respiratory normal breathing without difficulty. Psychiatric Patient is not able to cooperate in decision making regarding care. Patient is oriented to person only. pleasant and cooperative. Notes Patient has callus noted around the 4 foot wound which I did do a procedure on two pair ways some of this today. He tolerated this without complication. No bleeding was noted during procedure. Electronic Signature(s) Signed: 11/18/2016 5:30:37 PM By: Gretta Cool, BSN, RN, CWS, Kim RN, BSN Signed: 11/19/2016 1:25:22 AM By: Worthy Keeler [name] Previous Signature: 11/17/2016 8:34:49 AM Version By: Worthy Keeler [name] Entered By: Gretta Cool BSN, RN, CWS, Kim on 11/18/2016 17:29:17 [name], [name] ([id number]) -------------------------------------------------------------------------------- Physician Orders Details Patient Name: TRESTAN, VAHLE D. Date of Service: 11/14/2016 11:00 AM Medical Record Number: [id number] Patient Account Number: [id number] Date of  Birth/Sex: February 18, 1941 (76 y.o. 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 o Cleanse wound with mild soap and water Anesthetic Wound #3 Left,Plantar Foot o Topical Lidocaine 4% cream applied to wound bed prior to debridement Primary Wound Dressing Wound #3 Left,Plantar Foot o Prisma Ag - moisten with saline Secondary Dressing Wound #3 Left,Plantar Foot o ABD and Kerlix/Conform o Dry Gauze o Foam - for offloading (sent with pt) Dressing Change Frequency Wound #3 Left,Plantar Foot o Three times weekly Follow-up Appointments o Return Appointment in 1 week. Edema Control Wound #3 Left,Plantar Foot Goble, Hasani D. ([id number]) o Elevate legs to the level of the heart and pump ankles as often as possible Off-Loading Wound #3 Left,Plantar Foot o Open toe surgical shoe to: [place] #3 [place] Nurse may visit PRN to address patientos wound care needs. o FACE TO FACE ENCOUNTER: MEDICARE and MEDICAID PATIENTS: I certify that this patient is under my care and that I had a face-to-face encounter that meets the physician face-to-face encounter requirements with this patient on this date. The encounter with the patient was in whole or  in part for the following MEDICAL CONDITION: (primary reason for [place]) MEDICAL NECESSITY: I certify, that based on my findings, NURSING services are a medically necessary home health service. HOME BOUND STATUS: I certify that my clinical findings support that this patient is homebound (i.e., Due to illness or injury, pt requires aid of supportive devices such as crutches, cane, wheelchairs, walkers, the use of special transportation or the assistance of another person to leave  their place of residence. There is a normal inability to leave the home and doing so requires considerable and taxing effort. Other absences are for medical reasons / religious services and are infrequent or of short duration when for other reasons). o If current dressing causes regression in wound condition, may D/C ordered dressing product/s and apply Normal Saline Moist Dressing daily until next [place] / Other MD appointment. [place] of regression in wound condition at [phone]. o Please direct any NON-WOUND related issues/requests for orders to patient's Primary Care Physician Notes I'm gonna recommend that we continue with the silver collagen for the left forefoot location. We will see were things stand in one weeks time. If anything worsened significantly he will contact the office or rather his family will for additional recommendations. Electronic Signature(s) Signed: 11/18/2016 5:30:37 PM By: Gretta Cool, BSN, RN, CWS, Kim RN, BSN Signed: 11/19/2016 1:25:22 AM By: Worthy Keeler [name] Previous Signature: 11/14/2016 4:51:41 PM Version By: Alric Quan Entered By: Gretta Cool BSN, RN, CWS, Kim on 11/18/2016 17:29:31 [name], [name] ([id number]) -------------------------------------------------------------------------------- Problem List Details Patient Name: VERONICA, GUERRANT D. Date of Service: 11/14/2016 11:00 AM Medical Record Number: [id number] Patient Account Number:  [id number] Date of Birth/Sex: 1940-05-01 (76 y.o. Male) Treating RN: Montey Hora Primary Care Provider: [name] Other Clinician: Referring Provider: [name] Treating Provider/Extender: Melburn Hake,  Weeks in Treatment: 7 Active Problems ICD-10 Encounter Code Description Active Date Diagnosis 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, stage 4 09/26/2016 Yes I70.245 Atherosclerosis of native arteries of left leg with ulceration 09/26/2016 Yes of other part of foot L97.522 Non-pressure chronic ulcer of other part of left foot with fat 09/26/2016 Yes layer exposed F17.218 Nicotine dependence, cigarettes, with other nicotine- 10/03/2016 Yes induced disorders Inactive Problems Resolved Problems Electronic Signature(s) Signed: 11/18/2016 5:30:37 PM By: Gretta Cool, BSN, RN, CWS, Kim RN, BSN Signed: 11/19/2016 1:25:22 AM By: Worthy Keeler [name] Previous Signature: 11/17/2016 8:34:49 AM Version By: Worthy Keeler [name] Entered By: Gretta Cool, BSN, RN, CWS, Kim on 11/18/2016 17:28:31 [name], [name] D. ([id number]) -------------------------------------------------------------------------------- Progress Note Details Patient Name: [name], [name] D. Date of Service: 11/14/2016 11:00 AM Medical Record Number: [id number] Patient Account Number: [id number] Date of Birth/Sex: 03-22-1941 (76 y.o. Male) Treating RN: Montey Hora Primary Care Provider: [name] Other Clinician: Referring Provider: [name] Treating Provider/Extender: Melburn Hake,  Weeks in Treatment: 7 Subjective Chief Complaint Information obtained from Patient Patient is at the clinic for treatment of an open pressure ulcer the left heel and most recently has also developed a ulcer on the plantar aspect of his left foot near the fourth metatarsal head. History of Present Illness (HPI) The following HPI elements were documented for the patient's wound: Location: left heel ulceration and left plantar foot  ulcer Quality: Patient reports experiencing a dull pain to affected area(s). Severity: Patient states wound are getting worse. Duration: Patient has had the wound for > 2 months prior to seeking treatment at the wound center Timing: Pain in wound is Intermittent (comes and goes Context: The wound appeared gradually over time Modifying Factors: Other treatment(s) tried include:local care and offloading Associated Signs and Symptoms: Patient reports having difficulty standing for long periods. 76 year old gentleman who is a reformed alcoholic for about 6 months now was referred to as by his PCP team for a left heel ulceration which she's had for about 2 months. He has a history of alcohol abuse, aneurysm, hypertensive renal disease, anemia of chronic kidney disease, atrial fibrillation with  RVR, COPD, hypertension, seizure disorders, tobacco abuse, cerebral aneurysm repair in 1997 and some ice surgery. He continues to smoke about half a pack of cigarettes a day. the patient is not fully competent and has a part of attorney along with him today. 07/03/2016 -- x-ray of the left heel shows a soft tissue wound with no bony destruction or concerns for osteomyelitis. 07/10/2016 -- arterial studies are still pending. 07/30/2016 -- lower arterial examination done shows a right ABI of 0.85 and a left ABI of 0.59. 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 Bilateral CFA stenosis. Occluded left mid distal SFA. Three-vessel runoff bilaterally. The bilateral great toe pressures were also abnormal in the 10 digit toe PPG s are abnormal. PV consult suggested -- to see Dr. Fletcher Anon. appointment scheduled for this coming Tuesday 08/07/2016 -- was seen in the office by Dr. Annia Belt. He reviewed his arterial studies with multilevel disease including iliac and SFA and recommended an abdominal  aortogram, lower extremity runoff and possible endovascular intervention. [name], [name] ([id number]) 09/26/16 patient is seen for evaluation today for his left heel wound which we were treating for him prior to his admission to the hospital and then subsequent admission to a skilled nursing facility. Unfortunately he also has a new wound on the plantar surface of his left foot which is new and appears to have been facility acquired. He is having some discomfort he tells me but then does not seem to experience any pain with palpation over the wound. 10/03/2016 -- since I saw him last in early May, he had a vascular balloon angioplasty with an angiogram done by Dr. Fletcher Anon on 08/13/2016. he was found to have a short occlusion of the left distal SFA with heavy calcification and a 3 vessel runoff below the knee. Unsuccessful attempt at angioplasty of the left SFA due to inability to cross the occlusion. He had recommended an angioplasty of the left SFA via retrograde anterior tibial axis if the patient is agreeable. The patient was later admitted between May 14 to May 19, with the discharge diagnosis of seizures, tobacco use disorder, essential hypertension, peripheral vascular disease, chronic kidney disease, lactic acidosis and pressure injury of skin. He was treated for a community-acquired pneumonia and then discharged to a skilled nursing facility. Oncology was working him up for follow-up of multiple myeloma. At follow-up Dr. 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 He would give him appropriate symptomatic treatment and follow-up in 2 months. Dr. Fletcher Anon saw him again on 09/09/2016 and increased his dose of Lasix to 40 mg twice daily. Since the ulcer was progressing well he did not plan to perform revascularization and would follow him up in 3 months. 10/10/16 on evaluation today patient appears to be doing better in  regard to his left foot wounds. Both are improved compared to when I last saw him on evaluation. There is no evidence of infection 10/24/2016 -- had a left foot x-ray which showed soft tissue changes without acute bony abnormality. 11/14/16 on evaluation today patient appears to be doing very well in regard to his left heel which is actually healed at this point. Subsequently the plantar surface of his forefoot on the left headlight callus surrounding the opening appears to be doing well and is improving. Fortunately patient does not seem to be having any significant pain  in regard to the forefoot location. No fevers, chills, nausea, or vomiting noted at this time. Objective Constitutional Well-nourished and well-hydrated in no acute distress. Vitals Time Taken: 10:59 AM, Weight: 123.8 lbs, Pulse: 44 bpm, Respiratory Rate: 16 breaths/min, Blood Pressure: 170/64 mmHg. General Notes: Veneda Melter, PA aware of pts BP. [name], [name] ([id number]) Respiratory normal breathing without difficulty. Psychiatric Patient is not able to cooperate in decision making regarding care. Patient is oriented to person only. pleasant and cooperative. General Notes: Patient has callus noted around the 4 foot wound which I did do a procedure on two pair ways some of this today. He tolerated this without complication. No bleeding was noted during procedure. Integumentary (Hair, Skin) Wound #2 status is Healed - Epithelialized. Original cause of wound was Pressure Injury. The wound is located on the Left Calcaneus. The wound measures 0cm length x 0cm width x 0cm depth; 0cm^2 area and 0cm^3 volume. There is no tunneling or undermining noted. There is a none present amount of drainage noted. The wound margin is flat and intact. There is no granulation within the wound bed. There is no necrotic tissue within the wound bed. Wound #3 status is Open. Original cause of wound was Gradually Appeared. The wound is located on  the [place]. The wound measures 0.4cm length x 0.2cm width x 0.2cm depth; 0.063cm^2 area and 0.013cm^3 volume. There is Fat Layer (Subcutaneous Tissue) Exposed exposed. There is no tunneling or undermining noted. There is a large amount of serosanguineous drainage noted. The wound margin is distinct with the outline attached to the wound base. There is no granulation within the wound bed. There is a large (67-100%) amount of necrotic tissue within the wound bed including Eschar and Adherent Slough. The periwound skin appearance exhibited: Callus. The periwound skin appearance did not exhibit: Crepitus, Excoriation, Induration, Rash, Scarring, Dry/Scaly, Maceration, Atrophie Blanche, Cyanosis, Ecchymosis, Hemosiderin Staining, Mottled, Pallor, Rubor, Erythema. Periwound temperature was noted as No Abnormality. The periwound has tenderness on palpation. Assessment Active Problems ICD-10 L89.624 - Pressure ulcer of left heel, stage 4 I70.245 - Atherosclerosis of native arteries of left leg with ulceration of other part of foot L97.522 - Non-pressure chronic ulcer of other part of left foot with fat layer exposed F17.218 - Nicotine dependence, cigarettes, with other nicotine-induced disorders Procedures [name], [name] ([id number]) Wound #3 Pre-procedure diagnosis of Wound #3 is a Trauma, Other located on the [place] . There was a Non-Viable Tissue Open Wound/Selective [phone]) debridement with total area of 0.04 sq cm performed by [name],  E., [name]. with the following instrument(s): Curette to remove Viable and Non-Viable tissue/material including Exudate, Fibrin/Slough, and Subcutaneous after achieving pain control using Lidocaine 4% Topical Solution. A time out was conducted at 11:23, prior to the start of the procedure. A Minimum amount of bleeding was controlled with Pressure. The procedure was tolerated well with a pain level of 0 throughout and a pain level  of 0 following the procedure. Post Debridement Measurements: 0.3cm length x 0.2cm width x 0.2cm depth; 0.009cm^3 volume. Character of Wound/Ulcer Post Debridement requires further debridement. Post procedure Diagnosis Wound #3: Same as Pre-Procedure Plan Wound Cleansing: Wound #3 Left,Plantar Foot: Clean wound with Normal Saline. Cleanse wound with mild soap and water Anesthetic: Wound #3 Left,Plantar Foot: Topical Lidocaine 4% cream applied to wound bed prior to debridement Primary Wound Dressing: Wound #3 Left,Plantar Foot: Prisma Ag - moisten with saline Secondary Dressing: Wound #3 Left,Plantar Foot: ABD and Kerlix/Conform Dry Gauze Foam -  for offloading (sent with pt) Dressing Change Frequency: Wound #3 Left,Plantar Foot: Three times weekly Follow-up Appointments: Return Appointment in 1 week. Edema Control: Wound #3 Left,Plantar Foot: Elevate legs to the level of the heart and pump ankles as often as possible Off-Loading: Wound #3 Left,Plantar Foot: Open toe surgical shoe to: Home Health: Wound #3 Left,Plantar Foot: [place] Nurse may visit PRN to address patient s wound care needs. [name], [name] ([id number]) FACE TO FACE ENCOUNTER: MEDICARE and MEDICAID PATIENTS: I certify that this patient is under my care and that I had a face-to-face encounter that meets the physician face-to-face encounter requirements with this patient on this date. The encounter with the patient was in whole or in part for the following MEDICAL CONDITION: (primary reason for [place]) MEDICAL NECESSITY: I certify, that based on my findings, NURSING services are a medically necessary home health service. HOME BOUND STATUS: I certify that my clinical findings support that this patient is homebound (i.e., Due to illness or injury, pt requires aid of supportive devices such as crutches, cane, wheelchairs, walkers, the use of special transportation  or the assistance of another person to leave their place of residence. There is a normal inability to leave the home and doing so requires considerable and taxing effort. Other absences are for medical reasons / religious services and are infrequent or of short duration when for other reasons). If current dressing causes regression in wound condition, may D/C ordered dressing product/s and apply Normal Saline Moist Dressing daily until next [place] / Other MD appointment. [place] of regression in wound condition at [phone]. Please direct any NON-WOUND related issues/requests for orders to patient's Primary Care Physician General Notes: I'm gonna recommend that we continue with the silver collagen for the left forefoot location. We will see were things stand in one weeks time. If anything worsened significantly he will contact the office or rather his family will for additional recommendations. Electronic Signature(s) Signed: 11/18/2016 5:30:37 PM By: Gretta Cool, BSN, RN, CWS, Kim RN, BSN Signed: 11/19/2016 1:25:22 AM By: Worthy Keeler [name] Previous Signature: 11/17/2016 8:34:49 AM Version By: Worthy Keeler [name] Entered By: Gretta Cool BSN, RN, CWS, Kim on 11/18/2016 17:29:43 CID, AGENA ([id number]) -------------------------------------------------------------------------------- SuperBill Details Patient Name: KASHUS, KARLEN D. Date of Service: 11/14/2016 Medical Record Number: [id number] Patient Account Number: [id number] Date of Birth/Sex: 09-07-1940 (76 y.o. 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 F17.218 Nicotine dependence, cigarettes, with other nicotine-induced disorders Facility Procedures CPT4 Code Description: [id number] - DEBRIDE WOUND 1ST 20 SQ CM OR < ICD-10 Description Diagnosis L97.522 Non-pressure chronic ulcer of other part of left foot wi Modifier: th fat layer exp Quantity: 1 osed Physician Procedures CPT4 Code Description: [id number] - WC PHYS DEBR WO ANESTH 20 SQ CM ICD-10 Description Diagnosis L97.522 Non-pressure chronic ulcer of other part of left foot wit Modifier: h fat layer exp Quantity: 1 osed Electronic Signature(s) Signed: 11/18/2016 5:30:37 PM By: Gretta Cool, BSN, RN, CWS, Kim RN, BSN Signed: 11/19/2016 1:25:22 AM By: Worthy Keeler [name] Previous Signature: 11/17/2016 8:34:49 AM Version By:  Melburn Hake,  [name] Entered By: Gretta Cool, BSN, RN, CWS, Kim on 11/18/2016 17:29:51

## 2016-11-20 NOTE — Progress Notes (Signed)
[name] ([id number]) Visit Report for 11/14/2016 Arrival Information Details Patient Name: Jeremy [name] Date of Service: 11/14/2016 11:00 AM Medical Record Number: [id number] Patient Account Number: [id number] Date of Birth/Sex: 1940-08-03 (76 y.o. Male) Treating RN: Ahmed Prima Primary Care Infant Zink: [name] Other Clinician: Referring Janyla Biscoe: [name] Treating Joscelynn Brutus/Extender: Frann Rider in Treatment: 7 Visit Information History Since Last Visit All ordered tests and consults were No Patient Arrived: Ambulatory completed: Arrival Time: 10:58 Added or deleted any medications: No Accompanied By: niece Any new allergies or adverse reactions: No Transfer Assistance: None Had [name] fall or experienced change in No Patient Identification Verified: Yes activities of daily living that may affect Secondary Verification Process Yes risk of falls: Completed: Signs or symptoms of abuse/neglect No Patient Requires Transmission-Based No since last visito Precautions: Hospitalized since last visit: No Patient Has Alerts: No Has Dressing in Place as Prescribed: Yes Has Footwear/Offloading in Place as Yes Prescribed: Left: Surgical Shoe with Pressure Relief Insole Pain Present Now: No Electronic Signature(s) Signed: 11/14/2016 4:51:41 PM By: Alric Quan Entered By: Alric Quan on 11/14/2016 10:58:44 [name] ([id number]) -------------------------------------------------------------------------------- Encounter Discharge Information Details Patient Name: [name] Date of Service: 11/14/2016 11:00 AM Medical Record Number: [id number] Patient Account Number: [id number] Date of Birth/Sex: Feb 03, 1941 (76 y.o. Male) Treating RN: Ahmed Prima Primary Care Andi [place]field: [name] Other Clinician: Referring Landrie Beale: [name] Treating Tanaysia Bhardwaj/Extender: Frann Rider in Treatment: 7 Encounter Discharge Information  Items Discharge Pain Level: 0 Discharge Condition: Stable Ambulatory Status: Ambulatory Discharge Destination: Home Private Transportation: Auto Accompanied By: niece Schedule Follow-up Appointment: Yes Medication Reconciliation completed and No provided to Patient/Care Keyuna Cuthrell: Clinical Summary of Care: Electronic Signature(s) Signed: 11/14/2016 4:51:41 PM By: Alric Quan Entered By: Alric Quan on 11/14/2016 11:49:52 [name] ([id number]) -------------------------------------------------------------------------------- Lower Extremity Assessment Details Patient Name: [name] Date of Service: 11/14/2016 11:00 AM Medical Record Number: [id number] Patient Account Number: [id number] Date of Birth/Sex: May 02, 1940 (76 y.o. Male) Treating RN: Ahmed Prima Primary Care Allsion [place]: [name] Other Clinician: Referring Tyrez Berrios: [name] Treating Feleica Fulmore/Extender: Frann Rider in Treatment: 7 Vascular Assessment Pulses: Dorsalis Pedis Palpable: [Left:Yes] Posterior Tibial Extremity colors, hair growth, and conditions: Extremity Color: [Left:Normal] Temperature of Extremity: [Left:Warm] Capillary Refill: [Left:< 3 seconds] Electronic Signature(s) Signed: 11/14/2016 4:51:41 PM By: Alric Quan Entered By: Alric Quan on 11/14/2016 11:23:43 [name] ([id number]) -------------------------------------------------------------------------------- Multi Jeremy Chart Details Patient Name: [name] Date of Service: 11/14/2016 11:00 AM Medical Record Number: [id number] Patient Account Number: [id number] Date of Birth/Sex: 12-21-1940 (76 y.o. Male) Treating RN: Ahmed Prima Primary Care Treonna Klee: [name] Other Clinician: Referring Jeidy Hoerner: [name] Treating Haylyn Halberg/Extender: [name], HOYT Weeks in Treatment: 7 Vital Signs Height(in): Pulse(bpm): 44 Weight(lbs): 123.8 Blood  Pressure 170/64 (mmHg): Body Mass Index(BMI): Temperature(F): Respiratory Rate 16 (breaths/min): Photos: [[name]:[name]] Jeremy Location: Left Calcaneus Left Foot - Plantar [name] Wounding Event: Pressure Injury Gradually Appeared [name] Primary Etiology: Pressure Ulcer Trauma, Other [name] Comorbid History: Anemia, Chronic Anemia, Chronic [name] Obstructive Pulmonary Obstructive Pulmonary Disease (COPD), Disease (COPD), Arrhythmia, Hypertension, Arrhythmia, Hypertension, Dementia, Seizure Dementia, Seizure Disorder Disorder Date Acquired: 05/29/2016 09/12/2016 [name] Weeks of Treatment: 7 7 [name] Jeremy Status: Healed - Epithelialized Open [name] Pending Amputation on No Yes [name] Presentation: Measurements L x W x [name] 0x0x0 0.4x0.2x0.2 [name] (cm) [name], [name]. ([id number]) Area (cm) : 0 0.063 [name] Volume (cm) : 0 0.013 [name] % Reduction in Area: 100.00% 96.00% [name] % Reduction in Volume: 100.00% 91.70% [name]  Classification: Category/Stage II Partial Thickness [name] Exudate Amount: None Present Large [name] Exudate Type: [name] Serosanguineous [name] Exudate Color: [name] red, brown [name] Jeremy Margin: Flat and Intact Distinct, outline attached [name] Granulation Amount: None Present (0%) None Present (0%) [name] Necrotic Amount: None Present (0%) Large (67-100%) [name] Necrotic Tissue: [name] Eschar, Adherent Slough [name] Epithelialization: Large (67-100%) Medium (34-66%) [name] Debridement: [name] Open Jeremy/Selective [name] ([id number]) - Selective Pre-procedure [name] 11:23 [name] Verification/Time Out Taken: Pain Control: [name] Lidocaine 4% Topical [name] Solution Tissue Debrided: [name] Fibrin/Slough, Exudates, [name] Subcutaneous Level: [name] Non-Viable Tissue [name] Debridement Area (sq [name] 0.04 [name] cm): Instrument: [name] Curette [name] Bleeding: [name] Minimum [name] Hemostasis Achieved: [name] Pressure [name] Procedural Pain: [name] 0 [name] Post Procedural Pain: [name] 0 [name] Debridement Treatment [name] Procedure was tolerated [name] Response: well Post Debridement [name]  0.3x0.2x0.2 [name] Measurements L x W x [name] (cm) Post Debridement [name] 0.009 [name] Volume: (cm) Periwound Skin Texture: No Abnormalities Noted Callus: Yes [name] Excoriation: No Induration: No Crepitus: No Rash: No Scarring: No Periwound Skin No Abnormalities Noted Maceration: No [name] Moisture: Dry/Scaly: No Periwound Skin Color: No Abnormalities Noted Atrophie Blanche: No [name] Cyanosis: No Ecchymosis: No Erythema: No [name] ([id number]) Hemosiderin Staining: No Mottled: No Pallor: No Rubor: No Temperature: [name] No Abnormality [name] Tenderness on No Yes [name] Palpation: Jeremy Preparation: Ulcer Cleansing: Ulcer Cleansing: [name] Rinsed/Irrigated with Rinsed/Irrigated with Saline Saline Topical Anesthetic Topical Anesthetic Applied: None Applied: Other: lidocaine 4% Procedures Performed: [name] Debridement [name] Treatment Notes Jeremy #3 (Left, Plantar Foot) 1. Cleansed with: Clean Jeremy with Normal Saline 2. Anesthetic Topical Lidocaine 4% cream to Jeremy bed prior to debridement 4. Dressing Applied: Prisma Ag 5. Secondary Dressing Applied ABD Pad Dry Gauze Foam Kerlix/Conform 7. Secured with Recruitment consultant) Signed: 11/18/2016 5:30:37 PM By: Gretta Cool, BSN, RN, CWS, Kim RN, BSN Previous Signature: 11/14/2016 4:51:41 PM Version By: Alric Quan Entered By: Gretta Cool BSN, RN, CWS, Kim on 11/18/2016 17:28:40 Jeremy [name] ([id number]) -------------------------------------------------------------------------------- Multi-Disciplinary Care Plan Details Patient Name: Jeremy [name] Date of Service: 11/14/2016 11:00 AM Medical Record Number: [id number] Patient Account Number: [id number] Date of Birth/Sex: 11-01-1940 (76 y.o. Male) Treating RN: Ahmed Prima Primary Care Shauntae Reitman: [name] Other Clinician: Referring Annibelle Brazie: [name] Treating Myrlene Riera/Extender: Frann Rider in Treatment: 7 Active Inactive ` Abuse / Safety / Falls / Self Care  Management Nursing Diagnoses: Potential for falls Goals: Patient will not experience any injury related to falls Date Initiated: 09/26/2016 Target Resolution Date: 01/10/2017 Goal Status: Active Interventions: Assess: immobility, friction, shearing, incontinence upon admission and as needed Assess impairment of mobility on admission and as needed per policy Notes: ` Nutrition Nursing Diagnoses: Imbalanced nutrition Potential for alteratiion in Nutrition/Potential for imbalanced nutrition Goals: Patient/caregiver agrees to and verbalizes understanding of need to use nutritional supplements and/or vitamins as prescribed Date Initiated: 09/26/2016 Target Resolution Date: 01/10/2017 Goal Status: Active Interventions: Assess patient nutrition upon admission and as needed per policy Notes: ` Orientation to the [facility] MAJD, TISSUE ([id number]) Nursing Diagnoses: Knowledge deficit related to the [facility] program Goals: Patient/caregiver will verbalize understanding of the [place] Date Initiated: 09/26/2016 Target Resolution Date: 10/11/2016 Goal Status: Active Interventions: Provide education on orientation to the [facility] center Notes: ` Pain, Acute or Chronic Nursing Diagnoses: Pain, acute or chronic: actual or potential Potential alteration in comfort, pain Goals: Patient/caregiver will verbalize adequate pain control between visits Date Initiated: 09/26/2016 Target Resolution Date: 01/10/2017 Goal Status: Active Interventions: Complete pain  assessment as per visit requirements Notes: ` Pressure Nursing Diagnoses: Knowledge deficit related to causes and risk factors for pressure ulcer development Knowledge deficit related to management of pressures ulcers Potential for impaired tissue integrity related to pressure, friction, moisture, and shear Goals: Patient will remain free from development of additional pressure ulcers Date  Initiated: 09/26/2016 Target Resolution Date: 01/10/2017 Goal Status: Active Interventions: Assess: immobility, friction, shearing, incontinence upon admission and as needed Assess potential for pressure ulcer upon admission and as needed ABBOTT, JASINSKI ([id number]) Provide education on pressure ulcers Notes: ` Jeremy/Skin Impairment Nursing Diagnoses: Impaired tissue integrity Knowledge deficit related to ulceration/compromised skin integrity Goals: Ulcer/skin breakdown will have [name] volume reduction of 80% by week 12 Date Initiated: 09/26/2016 Target Resolution Date: 01/03/2017 Goal Status: Active Interventions: Assess patient/caregiver ability to perform ulcer/skin care regimen upon admission and as needed Assess ulceration(s) every visit Notes: Electronic Signature(s) Signed: 11/14/2016 4:51:41 PM By: Alric Quan Entered By: Alric Quan on 11/14/2016 11:24:06 [name] ([id number]) -------------------------------------------------------------------------------- Pain Assessment Details Patient Name: [name] Date of Service: 11/14/2016 11:00 AM Medical Record Number: [id number] Patient Account Number: [id number] Date of Birth/Sex: January 06, 1941 (76 y.o. Male) Treating RN: Ahmed Prima Primary Care Tynetta Bachmann: [name] Other Clinician: Referring Moya Duan: [name] Treating Makinsley Schiavi/Extender: Frann Rider in Treatment: 7 Active Problems Location of Pain Severity and Description of Pain Patient Has Paino No Site Locations With Dressing Change: No Pain Management and Medication Current Pain Management: Electronic Signature(s) Signed: 11/14/2016 4:51:41 PM By: Alric Quan Entered By: Alric Quan on 11/14/2016 10:59:15 [name], Jeremy Medina [name]. ([id number]) -------------------------------------------------------------------------------- Patient/Caregiver Education Details Patient Name: [name] Date of Service: 11/14/2016 11:00  AM Medical Record Number: [id number] Patient Account Number: [id number] Date of Birth/Gender: May 14, 1940 (76 y.o. Male) Treating RN: Ahmed Prima Primary Care Physician: [name] Other Clinician: Referring Physician: [name] Treating Physician/Extender: Frann Rider in Treatment: 7 Education Assessment Education Provided To: Patient Education Topics Provided Jeremy/Skin Impairment: Handouts: Other: change dressing as ordered Methods: Demonstration, Explain/Verbal Responses: State content correctly Electronic Signature(s) Signed: 11/14/2016 4:51:41 PM By: Alric Quan Entered By: Alric Quan on 11/14/2016 11:50:02 Jeremy [name] ([id number]) -------------------------------------------------------------------------------- Jeremy Assessment Details Patient Name: [name] Date of Service: 11/14/2016 11:00 AM Medical Record Number: [id number] Patient Account Number: [id number] Date of Birth/Sex: [birth year] (76 y.o. Male) Treating RN: Ahmed Prima Primary Care Joesiah Lonon: [name] Other Clinician: Referring Uriel Horkey: [name] Treating Manda Holstad/Extender: Frann Rider in Treatment: 7 Jeremy Status Jeremy Number: 2 Primary Pressure Ulcer Etiology: Jeremy Location: Left Calcaneus Jeremy Healed - Epithelialized Wounding Event: Pressure Injury Status: Date Acquired: 05/29/2016 Comorbid Anemia, Chronic Obstructive Pulmonary Weeks Of Treatment: 7 History: Disease (COPD), Arrhythmia, Clustered Jeremy: No Hypertension, Dementia, Seizure Disorder Photos Photo Uploaded By: Alric Quan on 11/14/2016 13:13:44 Jeremy Measurements Length: (cm) 0 % Reduction in Width: (cm) 0 % Reduction in Depth: (cm) 0 Epithelializati Area: (cm) 0 Tunneling: Volume: (cm) 0 Undermining: Area: 100% Volume: 100% on: Large (67-100%) No No Jeremy Description Classification: Category/Stage II Jeremy Margin: Flat and Intact Exudate Amount:  None Present Foul Odor After Cleansing: No Slough/Fibrino No Jeremy Bed Granulation Amount: None Present (0%) Necrotic Amount: None Present (0%) Periwound Skin Texture Texture Color Jeremy [name] ([id number]) No Abnormalities Noted: No No Abnormalities Noted: No Moisture No Abnormalities Noted: No Jeremy Preparation Ulcer Cleansing: Rinsed/Irrigated with Saline Topical Anesthetic Applied: None Electronic Signature(s) Signed: 11/14/2016 4:51:41 PM By: Alric Quan Entered By: Alric Quan on 11/14/2016 11:23:14 Jeremy [name] ([id number]) --------------------------------------------------------------------------------  Jeremy Assessment Details Patient Name: AQUILES, RUFFINI. Date of Service: 11/14/2016 11:00 AM Medical Record Number: [id number] Patient Account Number: [id number] Date of Birth/Sex: 05/31/1940 (76 y.o. Male) Treating RN: Ahmed Prima Primary Care Lindsey Hommel: [name] Other Clinician: Referring Deandrea Rion: [name] Treating Raffael Bugarin/Extender: Frann Rider in Treatment: 7 Jeremy Status Jeremy Number: 3 Primary Trauma, Other Etiology: Jeremy Location: Left Foot - Plantar Jeremy Open Wounding Event: Gradually Appeared Status: Date Acquired: 09/12/2016 Comorbid Anemia, Chronic Obstructive Pulmonary Weeks Of Treatment: 7 History: Disease (COPD), Arrhythmia, Clustered Jeremy: No Hypertension, Dementia, Seizure Pending Amputation On Presentation Disorder Photos Photo Uploaded By: Alric Quan on 11/14/2016 13:14:26 Jeremy Measurements Length: (cm) 0.4 Width: (cm) 0.2 Depth: (cm) 0.2 Area: (cm) 0.063 Volume: (cm) 0.013 % Reduction in Area: 96% % Reduction in Volume: 91.7% Epithelialization: Medium (34-66%) Tunneling: No Undermining: No Jeremy Description Classification: Partial Thickness Foul Odor Afte Jeremy Margin: Distinct, outline attached Slough/Fibrino Exudate Amount: Large Exudate Type: Serosanguineous Exudate Color: red,  brown r Cleansing: No Yes Jeremy Bed Granulation Amount: None Present (0%) Exposed Structure Necrotic Amount: Large (67-100%) Fascia Exposed: No Necrotic Quality: Eschar, Adherent Slough Fat Layer (Subcutaneous Tissue) Exposed: Yes Aja, Kalib [name]. ([id number]) Tendon Exposed: No Muscle Exposed: No Joint Exposed: No Bone Exposed: No Periwound Skin Texture Texture Color No Abnormalities Noted: No No Abnormalities Noted: No Callus: Yes Atrophie Blanche: No Crepitus: No Cyanosis: No Excoriation: No Ecchymosis: No Induration: No Erythema: No Rash: No Hemosiderin Staining: No Scarring: No Mottled: No Pallor: No Moisture Rubor: No No Abnormalities Noted: No Dry / Scaly: No Temperature / Pain Maceration: No Temperature: No Abnormality Tenderness on Palpation: Yes Jeremy Preparation Ulcer Cleansing: Rinsed/Irrigated with Saline Topical Anesthetic Applied: Other: lidocaine 4%, Treatment Notes Jeremy #3 (Left, Plantar Foot) 1. Cleansed with: Clean Jeremy with Normal Saline 2. Anesthetic Topical Lidocaine 4% cream to Jeremy bed prior to debridement 4. Dressing Applied: Prisma Ag 5. Secondary Dressing Applied ABD Pad Dry Gauze Foam Kerlix/Conform 7. Secured with Recruitment consultant) Signed: 11/14/2016 4:51:41 PM By: Alric Quan Entered By: Alric Quan on 11/14/2016 11:19:26 Petruzzi, Salih [name]. ([id number]) -------------------------------------------------------------------------------- Vitals Details Patient Name: [name] Date of Service: 11/14/2016 11:00 AM Medical Record Number: [id number] Patient Account Number: [id number] Date of Birth/Sex: [birth year] (76 y.o. Male) Treating RN: Ahmed Prima Primary Care Kaya Klausing: [name] Other Clinician: Referring Everet Flagg: [name] Treating Elisa Kutner/Extender: Frann Rider in Treatment: 7 Vital Signs Time Taken: 10:59 Pulse (bpm): 44 Weight (lbs): 123.8 Respiratory Rate  (breaths/min): 16 Blood Pressure (mmHg): 170/64 Reference Range: 80 - 120 mg / dl Notes Veneda Melter, PA aware of pts BP. Electronic Signature(s) Signed: 11/14/2016 4:51:41 PM By: Alric Quan Entered By: Alric Quan on 11/14/2016 11:07:32

## 2016-11-20 NOTE — Telephone Encounter (Signed)
Rx was sent to pharmacy electronically. 

## 2016-11-21 ENCOUNTER — Ambulatory Visit: Payer: Medicare Other | Admitting: Physician Assistant

## 2016-11-21 ENCOUNTER — Ambulatory Visit (HOSPITAL_BASED_OUTPATIENT_CLINIC_OR_DEPARTMENT_OTHER): Payer: Medicare Other | Admitting: Oncology

## 2016-11-21 ENCOUNTER — Other Ambulatory Visit (HOSPITAL_BASED_OUTPATIENT_CLINIC_OR_DEPARTMENT_OTHER): Payer: Medicare Other

## 2016-11-21 ENCOUNTER — Telehealth: Payer: Self-pay | Admitting: Oncology

## 2016-11-21 VITALS — BP 143/63 | HR 60 | Temp 97.8°F | Resp 18 | Ht 66.0 in | Wt 121.2 lb

## 2016-11-21 DIAGNOSIS — I739 Peripheral vascular disease, unspecified: Secondary | ICD-10-CM | POA: Diagnosis not present

## 2016-11-21 DIAGNOSIS — J449 Chronic obstructive pulmonary disease, unspecified: Secondary | ICD-10-CM

## 2016-11-21 DIAGNOSIS — G40909 Epilepsy, unspecified, not intractable, without status epilepticus: Secondary | ICD-10-CM

## 2016-11-21 DIAGNOSIS — D649 Anemia, unspecified: Secondary | ICD-10-CM

## 2016-11-21 DIAGNOSIS — N189 Chronic kidney disease, unspecified: Secondary | ICD-10-CM

## 2016-11-21 LAB — CBC WITH DIFFERENTIAL/PLATELET
BASO%: 0.8 % (ref 0.0–2.0)
BASOS ABS: 0 10*3/uL (ref 0.0–0.1)
EOS%: 8.7 % — ABNORMAL HIGH (ref 0.0–7.0)
Eosinophils Absolute: 0.6 10*3/uL — ABNORMAL HIGH (ref 0.0–0.5)
HEMATOCRIT: 35.8 % — AB (ref 38.4–49.9)
HEMOGLOBIN: 11.7 g/dL — AB (ref 13.0–17.1)
LYMPH#: 3 10*3/uL (ref 0.9–3.3)
LYMPH%: 47.6 % (ref 14.0–49.0)
MCH: 28.8 pg (ref 27.2–33.4)
MCHC: 32.8 g/dL (ref 32.0–36.0)
MCV: 87.7 fL (ref 79.3–98.0)
MONO#: 0.5 10*3/uL (ref 0.1–0.9)
MONO%: 8.5 % (ref 0.0–14.0)
NEUT#: 2.2 10*3/uL (ref 1.5–6.5)
NEUT%: 34.4 % — AB (ref 39.0–75.0)
PLATELETS: 146 10*3/uL (ref 140–400)
RBC: 4.08 10*6/uL — ABNORMAL LOW (ref 4.20–5.82)
RDW: 18.5 % — AB (ref 11.0–14.6)
WBC: 6.4 10*3/uL (ref 4.0–10.3)

## 2016-11-21 NOTE — Telephone Encounter (Signed)
Scheduled appt per 8/17 los - Gave patient AVS and calender per los  

## 2016-11-21 NOTE — Progress Notes (Signed)
  [facility] OFFICE PROGRESS NOTE   Diagnosis: Anemia  INTERVAL HISTORY:   Mr. Schurman returns as scheduled. No complaint. His caretaker reports he continues to smoke. He continues follow-up at the wound clinic for management of the left foot ulcer.  Objective:  Vital signs in last 24 hours:  Blood pressure (!) 143/63, pulse 60, temperature 97.8 F (36.6 C), temperature source Oral, resp. rate 18, height '5\' 6"'$  (1.676 m), weight 121 lb 3.2 oz (55 kg), SpO2 100 %.    HEENT: Neck without mass Lymphatics: No cervical or supraclavicular nodes Resp: Distant breath sounds, no respiratory distress Cardio: Regular rate and rhythm GI: No hepatosplenomegaly Vascular: No leg edema  Lab Results:  Lab Results  Component Value Date   WBC 6.4 11/21/2016   HGB 11.7 (L) 11/21/2016   HCT 35.8 (L) 11/21/2016   MCV 87.7 11/21/2016   PLT 146 11/21/2016   NEUTROABS 2.2 11/21/2016    Medications: I have reviewed the patient's current medications.  Assessment/Plan: 1. 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 History of alcohol and tobacco abuse 3. Peripheral vascular disease 4. Left heel ulcer 5. Seizure disorder 6. COPD 7. Chronic renal failure 8. History of a cerebral aneurysm    Disposition:  [name] appears stable. The hemoglobin has improved over the past 2 months. He may have early myelodysplasia or anemia related to alcohol, chronic disease, or renal insufficiency. The plan is to follow him with observation. He will return for an office visit and CBC in 4 months. A peripheral blood sample was submitted for germline DNA analysis to rule out Klinefelter syndrome.  15 minutes were spent with the patient today. The  majority of the time was used for counseling and coordination of care.  Donneta Romberg, MD  11/21/2016  12:33 PM

## 2016-11-26 ENCOUNTER — Ambulatory Visit (INDEPENDENT_AMBULATORY_CARE_PROVIDER_SITE_OTHER): Payer: Medicare Other | Admitting: Nurse Practitioner

## 2016-11-26 ENCOUNTER — Encounter: Payer: Self-pay | Admitting: Nurse Practitioner

## 2016-11-26 VITALS — BP 118/62 | HR 52 | Temp 97.7°F | Ht 66.0 in | Wt 122.0 lb

## 2016-11-26 DIAGNOSIS — N189 Chronic kidney disease, unspecified: Secondary | ICD-10-CM

## 2016-11-26 DIAGNOSIS — L89620 Pressure ulcer of left heel, unstageable: Secondary | ICD-10-CM

## 2016-11-26 DIAGNOSIS — R634 Abnormal weight loss: Secondary | ICD-10-CM

## 2016-11-26 DIAGNOSIS — Z23 Encounter for immunization: Secondary | ICD-10-CM | POA: Diagnosis not present

## 2016-11-26 DIAGNOSIS — F0391 Unspecified dementia with behavioral disturbance: Secondary | ICD-10-CM

## 2016-11-26 DIAGNOSIS — D631 Anemia in chronic kidney disease: Secondary | ICD-10-CM

## 2016-11-26 DIAGNOSIS — R600 Localized edema: Secondary | ICD-10-CM | POA: Diagnosis not present

## 2016-11-26 DIAGNOSIS — I480 Paroxysmal atrial fibrillation: Secondary | ICD-10-CM

## 2016-11-26 MED ORDER — ZOSTER VAC RECOMB ADJUVANTED 50 MCG/0.5ML IM SUSR: 0.5000 mL | Freq: Once | INTRAMUSCULAR | 1 refills | Status: AC

## 2016-11-26 MED ORDER — MEMANTINE HCL-DONEPEZIL HCL ER 28-10 MG PO CP24: 1.0000 | ORAL_CAPSULE | Freq: Every day | ORAL | 1 refills | Status: DC

## 2016-11-26 MED ORDER — CARVEDILOL 3.125 MG PO TABS: 3.1250 mg | ORAL_TABLET | Freq: Two times a day (BID) | ORAL | 1 refills | Status: DC

## 2016-11-26 MED ORDER — ZOSTER VAC RECOMB ADJUVANTED 50 MCG/0.5ML IM SUSR: 0.5000 mL | Freq: Once | INTRAMUSCULAR | 1 refills | Status: DC

## 2016-11-26 NOTE — Patient Instructions (Signed)
Will decrease coreg to 3.125 mg by mouth twice daily  To start namzaric titration pack (this is a combination of Aricept and namenda- if namzaric is too much money call us and we will separate the medication)  Stop aricept tablet (this is in the [place])

## 2016-11-26 NOTE — Progress Notes (Signed)
Careteam: Patient Care Team: [name], NP as PCP - General (Nurse Practitioner)  Advanced Directive information    Allergies  Allergen Reactions  . Diltiazem Rash and Other (See Comments)    Blisters  . Penicillins Other (See Comments)    Unknown Childhood allergy  Has patient had a PCN reaction causing immediate rash, facial/tongue/throat swelling, SOB or lightheadedness with hypotension: unknown Has patient had a PCN reaction causing severe rash involving mucus membranes or skin necrosis: unknown Has patient had a PCN reaction that required hospitalization unknown Has patient had a PCN reaction occurring within the last 10 years:no If all of the above answers are "NO", then may proceed with Cephalosporin use.     Chief Complaint  Patient presents with  . Medical Management of Chronic Issues    2 month follow-up, seen oncology on Friday needs to f/u on breathing,   . Immunizations    Flu Vaccine   . Medication Refill    Flu vaccine to be give today      HPI: Patient is a 76 y.o. male seen in the office today for routine follow up. 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 The plan is to follow him with observation.  Home health nursing and wound care following wounds on right heel and plantar surface foot- areas has almost completely healed per caregiver.  No shortness of breath noted.  No swelling in LE at this time.  No palpitations or chest pains.   Dementia- No increase in confusion; doing well on Aricept. MMSE 17/30 in 09/2016 Very particular about what he eats.  Previously eating a lot of cookies/candies/snacky food vs food with nutritional value.  Cousin who is  caregiver is trying to get him to eat foods with better nutritional value.  Drinking muscle milk in the morning and raisin brand.      Review of Systems:  Review of Systems  Unable to perform ROS: Dementia    Past Medical History:  Diagnosis Date  . Alcohol abuse   . Anemia   . Aneurysm ([place])   . CKD (chronic kidney disease), stage III   . COPD (chronic obstructive pulmonary disease) ([place])   . Dysrhythmia   . Fever of unknown origin 08/2016  . History of atrial fibrillation   . Hyperchloremia   . Hypercholesteremia   . Hyperpotassemia   . Hypertension   . Hypertensive renal disease, benign   . Leg edema   . PAD (peripheral artery disease) ([place])   . Pneumonia   . Pressure ulcer of left heel   . Seizures ([place])    has not had a seizure in 15 yrs  . Tobacco use   . Weight loss    Past Surgical History:  Procedure Laterality Date  . ABDOMINAL AORTOGRAM W/LOWER EXTREMITY N/A 08/13/2016   Procedure: Abdominal Aortogram w/Lower Extremity;  Surgeon: Wellington Hampshire, MD;  Location: [facility];  Service: Cardiovascular;  Laterality: N/A;  . 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, 1996  . EYE SURGERY     December 2017  . PERIPHERAL VASCULAR BALLOON ANGIOPLASTY  08/13/2016   Procedure: Peripheral Vascular Balloon Angioplasty;  Surgeon: Wellington Hampshire, MD;  Location: [facility];  Service: Cardiovascular;;  Aborted   Social History:   reports that he has been smoking Cigarettes.  He has a 14.00 pack-year smoking history. He has never used smokeless tobacco. He reports that he does not drink alcohol or use drugs.  Family History  Problem Relation Age of Onset  . Cancer Mother   . Congestive Heart Failure Father   . Alzheimer's disease Sister   . Congestive Heart Failure Sister   . Clotting disorder Sister   . Congestive Heart Failure Brother   . Clotting disorder Sister     Medications: Patient's Medications  New Prescriptions   No medications on file    Previous Medications   ASPIRIN EC 81 MG TABLET    Take 1 tablet (81 mg total) by mouth daily.   ATORVASTATIN (LIPITOR) 10 MG TABLET    TAKE 1 TABLET BY MOUTH EVERY DAY   CARVEDILOL (COREG) 6.25 MG TABLET    TAKE 1 TABLET (6.25 MG TOTAL) BY MOUTH 2 (TWO) TIMES DAILY WITH A MEAL.   [facility] B-1 100 MG TABLET    TAKE 1 TABLET BY MOUTH EVERY DAY   DIVALPROEX (DEPAKOTE) 250 MG DR TABLET    TAKE 1 TABLET BY MOUTH 3 TIMES A DAY FOR SEIZURES   DONEPEZIL (ARICEPT) 10 MG TABLET    Take 1 tablet (10 mg total) by mouth at bedtime.   FUROSEMIDE (LASIX) 40 MG TABLET    Take 1 tablet (40 mg total) by mouth daily.   GUAIFENESIN (ROBITUSSIN) 100 MG/5ML LIQUID    Take 10 mg by mouth 3 (three) times daily as needed for cough.   LEVETIRACETAM (KEPPRA) 500 MG TABLET    Take one tablet by mouth twice daily   MULTIPLE VITAMINS-MINERALS (THEREMS-M) TABS    Take 1 tablet by mouth daily.  Modified Medications   Modified Medication Previous Medication   ZOSTER VAC RECOMB ADJUVANTED (SHINGRIX) INJECTION Zoster Vac Recomb Adjuvanted (SHINGRIX) injection      Inject 0.5 mLs into the muscle once.    Inject 0.5 mLs into the muscle once.  Discontinued Medications   No medications on file     Physical Exam:  Vitals:   11/26/16 1009  BP: 118/62  Pulse: (!) 52  Temp: 97.7 F (36.5 C)  TempSrc: Oral  SpO2: 97%  Weight: 122 lb (55.3 kg)  Height: _0  (1.676 m)   Body mass index is 19.69 kg/m.  Physical Exam  Constitutional: No distress.  Frail thin male NAD.   HENT:  Mouth/Throat: Oropharynx is clear and moist. No oropharyngeal exudate.  Cardiovascular: Regular rhythm.  Bradycardia present.   Pulmonary/Chest: Effort normal. No respiratory distress. He has decreased breath sounds.  Abdominal: Soft. Bowel sounds are normal.  Musculoskeletal: Normal range of motion. He exhibits no edema or tenderness.  Neurological: He is alert.  Skin: Skin is warm. No rash noted.  dressing CDI to left foot wounds  Psychiatric:  He has a normal mood and affect. Cognition and memory are impaired. He exhibits abnormal recent memory.    Labs reviewed: Basic Metabolic Panel:  Recent Labs  04/14/16 1612  08/19/16 0454 08/19/16 1623 08/20/16 0417 10/15/16 1112  NA 140  < > 140  --  140 140  K 3.8  < > 4.6  --  4.2 5.1  CL 103  < > 110  --  112* 106  CO2 26  < > 22  --  21* 22  GLUCOSE 156*  < > 102*  --  101* 96  BUN  15  < > 34*  --  27* 43*  CREATININE 1.41*  < > 1.29*  --  1.13 1.35*  CALCIUM 9.3  < > 8.2*  --  8.1* 9.2  MG 1.9  --   --   --   --   --   TSH 1.992  --   --  3.541  --   --   < > = values in this interval not displayed. Liver Function Tests:  Recent Labs  06/19/16 1051 08/18/16 1348 08/21/16 1144  AST _0 ALT 6* 22 20  ALKPHOS 64 55 56  BILITOT 0.3 0.5 0.5  PROT 6.7 6.8 6.6  ALBUMIN 3.3* 2.9* 2.3*   No results for input(s): LIPASE, AMYLASE in the last 8760 hours. No results for input(s): AMMONIA in the last 8760 hours. CBC:  Recent Labs  09/23/16 1200 10/22/16 0940 11/21/16 1126  WBC 4.9 5.9 6.4  NEUTROABS 2.0 1.6 2.2  HGB 9.6* 11.1* 11.7*  HCT 29.7* 34.6* 35.8*  MCV 84.8 88.0 87.7  PLT 180 150 146   Lipid Panel:  Recent Labs  04/15/16 0211  CHOL 164  HDL 103  LDLCALC 51  TRIG 48  CHOLHDL 1.6   TSH:  Recent Labs  04/14/16 1612 08/19/16 1623  TSH 1.992 3.541   A1C: Lab Results  Component Value Date   HGBA1C 5.2 04/15/2016     Assessment/Plan 1. Dementia with behavioral disturbance, unspecified dementia type Tolerating aricept and doing well. Will add namenda titration with namzaric  - Memantine HCl-Donepezil HCl (NAMZARIC) 28-10 MG CP24; Take 1 capsule by mouth daily.  Dispense: 30 capsule; Refill: 1  2. Leg edema Resolved. conts on lasix, will follow up BMP, may be able to reduce dose  - BMP with eGFR  3. Loss of weight Weight stable. conts to work on getting food with proper nutritional value with supplement  4. Decubitus ulcer of  left heel, unstageable ([place]) Healing, following with wound care center and [facility] nursing weekly  5. Anemia in chronic kidney disease, unspecified CKD stage Following with oncology, hgb stable   6. Paroxysmal atrial fibrillation ([place]) Bradycardiac today in office with BP on the low side, HR has been low, will decrease coreg to 3.125 mg BID at this time and monitor.  - carvedilol (COREG) 3.125 MG tablet; Take 1 tablet (3.125 mg total) by mouth 2 (two) times daily with a meal.  Dispense: 60 tablet; Refill: 1  7. Need for influenza vaccination - Flu vaccine HIGH DOSE PF (Fluzone High dose)  Next appt: 4 weeks for BP and HR check. Carlos American. Harle Battiest  [facility] [phone] 8 am - 5 pm) [phone] (after hours)

## 2016-11-27 ENCOUNTER — Telehealth: Payer: Self-pay

## 2016-11-27 LAB — BASIC METABOLIC PANEL WITH GFR
BUN: 34 mg/dL — ABNORMAL HIGH (ref 7–25)
CHLORIDE: 103 mmol/L (ref 98–110)
CO2: 24 mmol/L (ref 20–32)
CREATININE: 1.38 mg/dL — AB (ref 0.70–1.18)
Calcium: 9.3 mg/dL (ref 8.6–10.3)
GFR, EST AFRICAN AMERICAN: 56 mL/min — AB (ref >=60)
GFR, Est Non African American: 49 mL/min — ABNORMAL LOW (ref >=60)
Glucose, Bld: 84 mg/dL (ref 65–99)
POTASSIUM: 4.7 mmol/L (ref 3.5–5.3)
SODIUM: 140 mmol/L (ref 135–146)

## 2016-11-27 MED ORDER — FUROSEMIDE 20 MG PO TABS: 20.0000 mg | ORAL_TABLET | Freq: Every day | ORAL | 2 refills | Status: DC

## 2016-11-27 NOTE — Telephone Encounter (Signed)
Discussed results with [name] verbalized understanding of results. Medication list updated and new rx sent to pharmacy.   Copy of labs mailed

## 2016-11-27 NOTE — Telephone Encounter (Signed)
-----   Message from [name], NP sent at 11/27/2016  9:07 AM EDT ----- Lab work looks okay, kidney function is decreased, Lets decrease lasix to 20 mg daily at this time. To keep 4 week follow up as scheduled

## 2016-11-28 ENCOUNTER — Other Ambulatory Visit: Payer: Self-pay | Admitting: *Deleted

## 2016-11-28 ENCOUNTER — Encounter: Payer: Medicare Other | Admitting: Surgery

## 2016-11-28 DIAGNOSIS — D649 Anemia, unspecified: Secondary | ICD-10-CM

## 2016-11-28 DIAGNOSIS — I70245 Atherosclerosis of native arteries of left leg with ulceration of other part of foot: Secondary | ICD-10-CM | POA: Diagnosis not present

## 2016-11-29 ENCOUNTER — Other Ambulatory Visit: Payer: Self-pay | Admitting: Nurse Practitioner

## 2016-11-29 DIAGNOSIS — F0391 Unspecified dementia with behavioral disturbance: Secondary | ICD-10-CM

## 2016-12-09 ENCOUNTER — Ambulatory Visit (INDEPENDENT_AMBULATORY_CARE_PROVIDER_SITE_OTHER): Payer: Medicare Other | Admitting: Cardiovascular Disease

## 2016-12-09 ENCOUNTER — Encounter: Payer: Self-pay | Admitting: Cardiovascular Disease

## 2016-12-09 VITALS — BP 173/63 | HR 51 | Ht 68.0 in | Wt 124.4 lb

## 2016-12-09 DIAGNOSIS — I480 Paroxysmal atrial fibrillation: Secondary | ICD-10-CM

## 2016-12-09 DIAGNOSIS — E785 Hyperlipidemia, unspecified: Secondary | ICD-10-CM

## 2016-12-09 DIAGNOSIS — I739 Peripheral vascular disease, unspecified: Secondary | ICD-10-CM | POA: Diagnosis not present

## 2016-12-09 DIAGNOSIS — I10 Essential (primary) hypertension: Secondary | ICD-10-CM

## 2016-12-09 NOTE — Progress Notes (Signed)
Cardiology Office Note   Date:  12/09/2016   ID:  [name], DOB 1940/04/19, MRN [id number]  PCP:  [name], NP  Cardiologist: Dr. Radford Pax  No chief complaint on file.     History of Present Illness: [name] is a 76 y.o. male who Is here today for a follow-up visit regarding peripheral arterial disease. 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 He was not felt to be a good candidate for anticoagulation given history of heavy alcohol use. Echocardiogram showed normal LV systolic function. He had a rash with diltiazem and was subsequently switched to carvedilol. He was seen for a pressure ulcer on the left heel.  He underwent noninvasive vascular evaluation in our office which showed an ABI of 0.52 on the left with significant left common and external iliac artery stenosis with velocity greater than 350 and an occluded left mid SFA. Angiography in May, 2018 showed moderate heavily calcified iliac disease bilaterally with short occlusion of the left distal SFA with three-vessel runoff below the knee. Attempted angioplasty of the left SFA was not successful due to inability to cross the occlusion. He was scheduled for a staged procedure via the retrograde anterior tibial artery access. However, he was hospitalized for low-grade fever, cough and anemia. He was treated for pneumonia. No obvious source of bleeding was identified including CT scan of the abdomen and pelvis. 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  The patient's left heel ulcer improved with wound care and is almost completely healed. He has no leg claudication but doesn't walk much. He denies chest pain or shortness of breath.  Past Medical History:  Diagnosis Date  . Alcohol  abuse   . Anemia   . Aneurysm ([place])   . CKD (chronic kidney disease), stage III   . COPD (chronic obstructive pulmonary disease) ([place])   . Dysrhythmia   . Fever of unknown origin 08/2016  . History of atrial fibrillation   . Hyperchloremia   . Hypercholesteremia   . Hyperpotassemia   . Hypertension   . Hypertensive renal disease, benign   . Leg edema   . PAD (peripheral artery disease) ([place])   . Pneumonia   . Pressure ulcer of left heel   . Seizures ([employer])    has not had a seizure in 15 yrs  . Tobacco use   . Weight loss     Past Surgical History:  Procedure Laterality Date  . ABDOMINAL AORTOGRAM W/LOWER EXTREMITY N/A 08/13/2016   Procedure: Abdominal Aortogram w/Lower Extremity;  Surgeon: Wellington Hampshire, MD;  Location: [facility];  Service: Cardiovascular;  Laterality: N/A;  . 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, 1996  . EYE SURGERY     December 2017  . PERIPHERAL VASCULAR BALLOON ANGIOPLASTY  08/13/2016   Procedure: Peripheral Vascular Balloon Angioplasty;  Surgeon: Wellington Hampshire, MD;  Location: [facility];  Service: Cardiovascular;;  Aborted     Current Outpatient Prescriptions  Medication Sig Dispense Refill  . aspirin EC 81 MG tablet Take 1 tablet (81 mg total) by mouth daily. 90 tablet 3  . atorvastatin (LIPITOR) 10 MG tablet TAKE 1 TABLET BY MOUTH EVERY DAY 90 tablet 1  . carvedilol (COREG) 3.125 MG tablet Take 1 tablet (3.125 mg total) by mouth 2 (  two) times daily with a meal. 60 tablet 1  . [facility] B-1 100 MG tablet TAKE 1 TABLET BY MOUTH EVERY DAY 30 tablet 3  . divalproex (DEPAKOTE) 250 MG DR tablet TAKE 1 TABLET BY MOUTH 3 TIMES A DAY FOR SEIZURES 90 tablet 1  . furosemide (LASIX) 20 MG tablet Take 1 tablet (20 mg total) by mouth daily. 30 tablet 2  . guaiFENesin (ROBITUSSIN) 100 MG/5ML liquid Take 10 mg by mouth 3 (three) times daily as needed for cough.    . levETIRAcetam (KEPPRA) 500 MG tablet Take one tablet by mouth twice daily 180 tablet 1  .  Memantine HCl-Donepezil HCl (NAMZARIC) 28-10 MG CP24 Take 1 capsule by mouth daily. 30 capsule 1  . Multiple Vitamins-Minerals (THEREMS-M) TABS Take 1 tablet by mouth daily. 90 tablet 3   No current facility-administered medications for this visit.     Allergies:   Diltiazem and Penicillins    Social History:  The patient  reports that he has been smoking Cigarettes.  He has a 14.00 pack-year smoking history. He has never used smokeless tobacco. He reports that he does not drink alcohol or use drugs.   Family History:  The patient's family history includes Alzheimer's disease in his sister; Cancer in his mother; Clotting disorder in his sister and sister; Congestive Heart Failure in his brother, father, and sister.    ROS:  Please see the history of present illness.   Otherwise, review of systems are positive for none.   All other systems are reviewed and negative.    PHYSICAL EXAM: VS:  BP (!) 173/63 (BP Location: Right Arm)   Pulse (!) 51   Ht 5' 8" (1.727 m)   Wt 124 lb 6.4 oz (56.4 kg)   BMI 18.91 kg/m  , BMI Body mass index is 18.91 kg/m. GEN: Well nourished, well developed, in no acute distress  HEENT: normal  Neck: no JVD, carotid bruits, or masses Cardiac: RRR; no ubs, or gall+2 edema . 2/6 systolic ejection murmur at the aortic area  Respiratory:  clear to auscultation bilaterally, normal work of breathing GI: soft, nontender, nondistended, + BS MS: no deformity or atrophy  Skin: warm and dry, no rash Neuro:  Strength and sensation are intact Psych: euthymic mood, full affect Vascular: Femoral pulses +1 bilaterally with bruits. Distal pulses are not palpable. T  EKG:  EKG is not ordered today.    Recent Labs: 04/14/2016: B Natriuretic Peptide 1,172.2; Magnesium 1.9 08/19/2016: TSH 3.541 08/21/2016: ALT 20 11/21/2016: HGB 11.7; Platelets 146 11/26/2016: BUN 34; Creat 1.38; Potassium 4.7; Sodium 140    Lipid Panel    Component Value Date/Time   CHOL 164 04/15/2016  0211   CHOL 206 (H) 07/13/2014 1130   TRIG 48 04/15/2016 0211   HDL 103 04/15/2016 0211   HDL 112 07/13/2014 1130   CHOLHDL 1.6 04/15/2016 0211   VLDL 10 04/15/2016 0211   LDLCALC 51 04/15/2016 0211   LDLCALC 83 07/13/2014 1130      Wt Readings from Last 3 Encounters:  12/09/16 124 lb 6.4 oz (56.4 kg)  11/26/16 122 lb (55.3 kg)  11/21/16 121 lb 3.2 oz (55 kg)       No flowsheet data found.    ASSESSMENT AND PLAN:  1.  Peripheral arterial disease:  Left heel ulceration healed without revascularization. Continue medical therapy as he is asymptomatic at the present time. Continue low-dose aspirin.  2. Hyperlipidemia: Continue treatment with atorvastatin.  3. Previous tobacco use:  He quit smoking recently.  4. Paroxysmal atrial fibrillation: Currently in regular rhythm. Not a candidate for anticoagulation although he might become a candidate with regular follow-up, no excessive alcohol use and no falls. That will depend on his anemia situation.  5. Bilateral leg edema: Improved significantly. Currently on small dose furosemide.   6. Essential hypertension: Blood pressure is elevated but usually his blood pressure is in the normal range. He had ham from breakfast this morning. Continue to monitor blood pressure for now.    Disposition:   FU with me in 6 months  Signed,  Kathlyn Sacramento, MD  12/09/2016 9:14 AM    [place]

## 2016-12-09 NOTE — Patient Instructions (Signed)
Your physician wants you to follow-up in: [place] will receive a reminder letter in the mail two months in advance. If you don't receive a letter, please call our office to schedule the follow-up appointment.   If you need a refill on your cardiac medications before your next appointment, please call your pharmacy.

## 2016-12-22 NOTE — Progress Notes (Signed)
[name], [name] ([id number]) Visit Report for 11/28/2016 Chief Complaint Document Details Patient Name: [name], [name] Date of Service: 11/28/2016 9:45 AM Medical Record Number: [id number] Patient Account Number: [id number] Date of Birth/Sex: Mar 17, 1941 (76 y.o. Male) Treating RN: Ahmed Prima Primary Care Provider: [name] Other Clinician: Referring Provider: [name] Treating Provider/Extender: Frann Rider in Treatment: 9 Information Obtained from: Patient Chief Complaint Patient is at the clinic for treatment of an open pressure ulcer the left heel and most recently has also developed a ulcer on the plantar aspect of his left foot near the fourth metatarsal head. Electronic Signature(s) Signed: 12/04/2016 4:28:32 PM By: Christin Fudge MD, FACS Signed: 12/22/2016 1:03:30 PM By: Sharon Mt Previous Signature: 11/28/2016 11:54:07 AM Version By: Christin Fudge MD, FACS Entered By: Sharon Mt on 12/04/2016 16:05:24 [name], [name] ([id number]) -------------------------------------------------------------------------------- HPI Details Patient Name: ACY, ORSAK D. Date of Service: 11/28/2016 9:45 AM Medical Record Number: [id number] Patient Account Number: [id number] Date of Birth/Sex: [birth year] (76 y.o. Male) Treating RN: Ahmed Prima Primary Care Provider: [name] Other Clinician: Referring Provider: [name] Treating Provider/Extender: Frann Rider in Treatment: 9 History of Present Illness Location: left heel ulceration and left plantar foot ulcer Quality: Patient reports experiencing a dull pain to affected area(s). Severity: Patient states wound are getting worse. Duration: Patient has had the wound for > 2 months prior to seeking treatment at the wound center Timing: Pain in wound is Intermittent (comes and goes Context: The wound appeared gradually over time Modifying Factors: Other treatment(s) tried include:local care and  offloading Associated Signs and Symptoms: Patient reports having difficulty standing for long periods. HPI Description: 76 year old gentleman who is a reformed alcoholic for about 6 months now was referred to as by his PCP team for a left heel ulceration which she's had for about 2 months. He has a history of alcohol abuse, aneurysm, hypertensive renal disease, anemia of chronic kidney disease, atrial fibrillation with RVR, COPD, hypertension, seizure disorders, tobacco abuse, cerebral aneurysm repair in 1997 and some ice surgery. He continues to smoke about half a pack of cigarettes a day. the patient is not fully competent and has a part of attorney along with him today. 07/03/2016 -- x-ray of the left heel shows a soft tissue wound with no bony destruction or concerns for osteomyelitis. 07/10/2016 -- arterial studies are still pending. 07/30/2016 -- lower arterial examination done shows a right ABI of 0.85 and a left ABI of 0.59. 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 Bilateral CFA stenosis. Occluded left mid distal SFA. Three-vessel runoff bilaterally. The bilateral great toe pressures were also abnormal in the 10 digit toe PPGos are abnormal. PV consult suggested -- to see Dr. Fletcher Anon. appointment scheduled for this coming Tuesday 08/07/2016 -- was seen in the office by Dr. Annia Belt. He reviewed his arterial studies with multilevel disease including iliac and SFA and recommended an abdominal aortogram, lower extremity runoff and possible endovascular intervention. 09/26/16 patient is seen for evaluation today for his left heel wound which we were treating for him prior to his admission to the hospital and then subsequent admission to a skilled nursing facility. Unfortunately he also has a new wound on the plantar surface of his left foot which is new and appears to have  been facility acquired. He is having some discomfort he tells me but then does not seem to experience any pain  with palpation over the wound. 10/03/2016 -- since I saw him last in early May, he had a vascular balloon angioplasty with an angiogram done by Dr. Fletcher Anon on 08/13/2016. he was found to have a short occlusion of the left distal SFA with heavy calcification and a 3 vessel runoff below the knee. Unsuccessful attempt at angioplasty of the left SFA due Harms, Zaidyn D. ([id number]) to inability to cross the occlusion. He had recommended an angioplasty of the left SFA via retrograde anterior tibial axis if the patient is agreeable. The patient was later admitted between May 14 to May 19, with the discharge diagnosis of seizures, tobacco use disorder, essential hypertension, peripheral vascular disease, chronic kidney disease, lactic acidosis and pressure injury of skin. He was treated for a community-acquired pneumonia and then discharged to a skilled nursing facility. Oncology was working him up for follow-up of multiple myeloma. At follow-up Dr. 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 He would give him appropriate symptomatic treatment and follow-up in 2 months. Dr. Fletcher Anon saw him again on 09/09/2016 and increased his dose of Lasix to 40 mg twice daily. Since the ulcer was progressing well he did not plan to perform revascularization and would follow him up in 3 months. 10/10/16 on evaluation today patient appears to be doing better in regard to his left foot wounds. Both are improved compared to when I last saw him on evaluation. There is no evidence of infection 10/24/2016 -- had a left foot x-ray which showed soft tissue changes without acute bony abnormality. 11/14/16 on evaluation today patient appears to be doing very well in regard to his left heel which is actually healed at this point. Subsequently the plantar  surface of his forefoot on the left headlight callus surrounding the opening appears to be doing well and is improving. Fortunately patient does not seem to be having any significant pain in regard to the forefoot location. No fevers, chills, nausea, or vomiting noted at this time. Electronic Signature(s) Signed: 12/04/2016 4:28:32 PM By: Christin Fudge MD, FACS Signed: 12/22/2016 1:03:30 PM By: Sharon Mt Previous Signature: 11/28/2016 11:54:07 AM Version By: Christin Fudge MD, FACS Entered By: Sharon Mt on 12/04/2016 16:05:31 [name], [name] ([id number]) -------------------------------------------------------------------------------- Physical Exam Details Patient Name: [name], [name] Date of Service: 11/28/2016 9:45 AM Medical Record Number: [id number] Patient Account Number: [id number] Date of Birth/Sex: [birth year] (76 y.o. Male) Treating RN: Ahmed Prima Primary Care Provider: [name] Other Clinician: Referring Provider: [name] Treating Provider/Extender: Frann Rider in Treatment: 9 Constitutional . Pulse regular. Respirations normal and unlabored. Afebrile. . Eyes Nonicteric. Reactive to light. Ears, Nose, Mouth, and Throat Lips, teeth, and gums WNL.Marland Kitchen Moist mucosa without lesions. Neck supple and nontender. No palpable supraclavicular or cervical adenopathy. Normal sized without goiter. Respiratory WNL. No retractions.. Cardiovascular Pedal Pulses WNL. No clubbing, cyanosis or edema. Lymphatic No adneopathy. No adenopathy. No adenopathy. Musculoskeletal Adexa without tenderness or enlargement.. Digits and nails w/o clubbing, cyanosis, infection, petechiae, ischemia, or inflammatory conditions.. Integumentary (Hair, Skin) No suspicious lesions. No crepitus or fluctuance. No peri-wound warmth or erythema. No masses.Marland Kitchen Psychiatric Judgement and insight Intact.. No evidence of depression, anxiety, or agitation.. Notes the wound/callous on the left heel  was carefully removed with a curette and there was no open wound. The wound on the plantar aspect was also carefully examined and there was no open wound. Electronic Signature(s) Signed: 12/04/2016 4:28:32 PM By: Christin Fudge MD, FACS Signed:  12/22/2016 1:03:30 PM By: Sharon Mt Previous Signature: 11/28/2016 11:54:07 AM Version By: Christin Fudge MD, FACS Entered By: Sharon Mt on 12/04/2016 16:05:45 [name] ([id number]) -------------------------------------------------------------------------------- Physician Orders Details Patient Name: Jeremy Gang D. Date of Service: 11/28/2016 9:45 AM Medical Record Number: [id number] Patient Account Number: [id number] Date of Birth/Sex: 02-22-1941 (76 y.o. Male) Treating RN: Ahmed Prima Primary Care Provider: [name] Other Clinician: Referring Provider: [name] Treating Provider/Extender: Frann Rider in Treatment: 9 Verbal / Phone Orders: Yes Clinician: Carolyne Fiscal, Debi Read Back and Verified: Yes Diagnosis [place] Discharge From [facility] o Discharge from [place] areas clean and dry. Protect areas from pressure. Please call us if you have any questions or concerns. Electronic Signature(s) Signed: 12/04/2016 4:28:32 PM By: Christin Fudge MD, FACS Signed: 12/22/2016 1:03:30 PM By: Sharon Mt Previous Signature: 11/28/2016 4:07:04 PM Version By: Alric Quan Entered By: Sharon Mt on 12/04/2016 16:04:54 Bigos, Samel D. ([id number]) -------------------------------------------------------------------------------- Problem List Details Patient Name: TAVON, MAGNUSSEN D. Date of Service: 11/28/2016 9:45 AM Medical Record Number: [id number] Patient Account Number: [id number] Date of Birth/Sex: November 12, 1940 (76 y.o. 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 ([id number]) -------------------------------------------------------------------------------- Progress Note Details Patient Name: Jeremy Gang D. Date of Service: 11/28/2016 9:45 AM Medical Record Number: [id number] Patient Account Number: [id number] Date of Birth/Sex: June 14, 1940 (76 y.o. Male) Treating RN: Ahmed Prima Primary Care Provider: [name] Other Clinician: Referring Provider: [name] Treating Provider/Extender: Frann Rider in Treatment: 9 Subjective Chief Complaint Information obtained from Patient Patient is at the clinic for treatment of an open pressure ulcer the left heel and most recently has also developed a ulcer on the plantar aspect of his left foot near the fourth metatarsal head. History of Present Illness (HPI) The following HPI elements were documented for the patient's wound: Location: left heel ulceration and left plantar foot ulcer Quality: Patient reports experiencing a dull pain to affected area(s). Severity: Patient states wound are getting worse. Duration: Patient has had the wound for > 2 months prior to seeking treatment at the wound  center Timing: Pain in wound is Intermittent (comes and goes Context: The wound appeared gradually over time Modifying Factors: Other treatment(s) tried include:local care and offloading Associated Signs and Symptoms: Patient reports having difficulty standing for long periods. 76 year old gentleman who is a reformed alcoholic for about 6 months now was referred to as by his PCP team for a left heel ulceration which she's had for about 2 months. He has a history of alcohol abuse, aneurysm, hypertensive renal disease, anemia of chronic kidney disease, atrial fibrillation with RVR, COPD, hypertension, seizure disorders, tobacco abuse, cerebral aneurysm repair in 1997 and some ice surgery. He continues to smoke about half a pack  of cigarettes a day. the patient is not fully competent and has a part of attorney along with him today. 07/03/2016 -- x-ray of the left heel shows a soft tissue wound with no bony destruction or concerns for osteomyelitis. 07/10/2016 -- arterial studies are still pending. 07/30/2016 -- lower arterial examination done shows a right ABI of 0.85 and a left ABI of 0.59. 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 Bilateral CFA stenosis. Occluded left mid distal SFA. Three-vessel runoff bilaterally. The bilateral great toe pressures were also abnormal in the 10 digit toe PPG s are abnormal. PV consult suggested -- to see Dr. Fletcher Anon. appointment scheduled for this coming Tuesday 08/07/2016 -- was seen in the office by Dr. Annia Belt. He reviewed his arterial studies with multilevel disease including iliac and SFA and recommended an abdominal aortogram, lower extremity runoff and possible endovascular intervention. [name], [name] ([id number]) 09/26/16 patient is seen for evaluation today for his left heel wound which we were treating for him prior to his admission  to the hospital and then subsequent admission to a skilled nursing facility. Unfortunately he also has a new wound on the plantar surface of his left foot which is new and appears to have been facility acquired. He is having some discomfort he tells me but then does not seem to experience any pain with palpation over the wound. 10/03/2016 -- since I saw him last in early May, he had a vascular balloon angioplasty with an angiogram done by Dr. Fletcher Anon on 08/13/2016. he was found to have a short occlusion of the left distal SFA with heavy calcification and a 3 vessel runoff below the knee. Unsuccessful attempt at angioplasty of the left SFA due to inability to cross the occlusion. He had recommended an angioplasty of the left SFA via retrograde anterior tibial axis if the patient is agreeable. The patient was later admitted between May 14 to May 19, with the discharge diagnosis of seizures, tobacco use disorder, essential hypertension, peripheral vascular disease, chronic kidney disease, lactic acidosis and pressure injury of skin. He was treated for a community-acquired pneumonia and then discharged to a skilled nursing facility. Oncology was working him up for follow-up of multiple myeloma. At follow-up Dr. 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 He would give him appropriate symptomatic treatment and follow-up in 2 months. Dr. Fletcher Anon saw him again on 09/09/2016 and increased his dose of Lasix to 40 mg twice daily. Since the ulcer was progressing well he did not plan to perform revascularization and would follow him up in 3 months. 10/10/16 on evaluation today patient appears to be doing better in regard to his left foot wounds. Both are improved compared to when I last saw him on evaluation. There is no evidence of infection 10/24/2016 -- had a left foot x-ray which showed soft tissue changes without acute bony  abnormality. 11/14/16 on evaluation today patient appears to be doing very well in regard to his left heel which is actually healed at this point. Subsequently the plantar surface of his forefoot on the left headlight callus surrounding the opening appears to be doing well and is improving. Fortunately patient does not seem to be having any significant pain in regard to the forefoot location. No fevers, chills, nausea, or vomiting noted at this time. Objective Constitutional Pulse regular. Respirations normal and unlabored.  Afebrile. Vitals Time Taken: 9:57 AM, Weight: 123.8 lbs, Pulse: 41 bpm, Respiratory Rate: 16 breaths/min, Blood Pressure: 197/60 mmHg. General Notes: Made Dr. Con Memos aware of BP. [name], [name] ([id number]) Eyes Nonicteric. Reactive to light. Ears, Nose, Mouth, and Throat Lips, teeth, and gums WNL.Marland Kitchen Moist mucosa without lesions. Neck supple and nontender. No palpable supraclavicular or cervical adenopathy. Normal sized without goiter. Respiratory WNL. No retractions.. Cardiovascular Pedal Pulses WNL. No clubbing, cyanosis or edema. Lymphatic No adneopathy. No adenopathy. No adenopathy. Musculoskeletal Adexa without tenderness or enlargement.. Digits and nails w/o clubbing, cyanosis, infection, petechiae, ischemia, or inflammatory conditions.Marland Kitchen Psychiatric Judgement and insight Intact.. No evidence of depression, anxiety, or agitation.. General Notes: the wound/callous on the left heel was carefully removed with a curette and there was no open wound. The wound on the plantar aspect was also carefully examined and there was no open wound. Integumentary (Hair, Skin) No suspicious lesions. No crepitus or fluctuance. No peri-wound warmth or erythema. No masses.. Wound #3 status is Healed - Epithelialized. Original cause of wound was Gradually Appeared. The wound is located on the [place]. The wound measures 0cm length x 0cm width x 0cm depth; 0cm^2 area and  0cm^3 volume. There is Fat Layer (Subcutaneous Tissue) Exposed exposed. There is no tunneling or undermining noted. There is a none present amount of drainage noted. The wound margin is distinct with the outline attached to the wound base. There is no granulation within the wound bed. There is no necrotic tissue within the wound bed. The periwound skin appearance exhibited: Callus. The periwound skin appearance did not exhibit: Crepitus, Excoriation, Induration, Rash, Scarring, Dry/Scaly, Maceration, Atrophie Blanche, Cyanosis, Ecchymosis, Hemosiderin Staining, Mottled, Pallor, Rubor, Erythema. Periwound temperature was noted as No Abnormality. The periwound has tenderness on palpation. Assessment Active Problems ICD-10 [name], [name] ([id number]) [phone] - Pressure ulcer of left heel, stage 4 I70.245 - Atherosclerosis of native arteries of left leg with ulceration of other part of foot L97.522 - Non-pressure chronic ulcer of other part of left foot with fat layer exposed F17.218 - Nicotine dependence, cigarettes, with other nicotine-induced disorders Plan Home Health: D/C [place] Discharge From [facility] Services: Discharge from [place] areas clean and dry. Protect areas from pressure. Please call us if you have any questions or concerns. the patient's wounds have completely healed and I have discharged him from the wound care services with clear instructions to offload at least for another few weeks and completely give up smoking. 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 ([id number]) -------------------------------------------------------------------------------- SuperBill Details Patient Name: BRENTT, FREAD  D. Date of Service: 11/28/2016 Medical Record Number: [id number] Patient Account Number: [id number] Date of Birth/Sex: [birth year] (76 y.o. Male) Treating RN: Ahmed Prima Primary Care Provider: [name] Other Clinician: Referring Provider: [name] Treating Provider/Extender: Frann Rider in Treatment: 9 Diagnosis Coding ICD-10 Codes Code Description [phone] Pressure ulcer of left heel, 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 L97.522 Non-pressure chronic ulcer of other part of left foot with fat layer exposed F17.218 Nicotine dependence, cigarettes, with other nicotine-induced disorders Facility Procedures CPT4 Code: [id number] Description: 43329 - WOUND CARE VISIT-LEV 2 EST PT Modifier: Quantity: 1 Physician Procedures CPT4: Description  Modifier Quantity Code [id number] - WC PHYS LEVEL 2 - EST PT 1 ICD-10 Description Diagnosis 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, 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  L97.522 Non-pressure chronic ulcer of other part of left foot with fat layer exposed Electronic Signature(s) Signed: 12/04/2016 4:28:32 PM By: Christin Fudge MD, FACS Signed: 12/22/2016 1:03:30 PM By: Sharon Mt Previous Signature: 11/28/2016 4:07:04 PM Version By: Alric Quan Entered By: Sharon Mt on 12/04/2016 16:05:07

## 2016-12-22 NOTE — Progress Notes (Signed)
Jeremy, [name] ([id number]) Visit Report for 11/28/2016 Arrival Information Details Patient Name: Jeremy, [name]. Date of Service: 11/28/2016 9:45 AM Medical Record Number: [id number] Patient Account Number: [id number] Date of Birth/Sex: [birth year] (76 y.o. Male) Treating RN: Ahmed Prima Primary Care Agapita Savarino: [name] Other Clinician: Referring Tobby Fawcett: [name] Treating Kalix Meinecke/Extender: Frann Rider in Treatment: 9 Visit Information History Since Last Visit All ordered tests and consults were completed: No Patient Arrived: Ambulatory Added or deleted any medications: No Arrival Time: 09:55 Any new allergies or adverse reactions: No Accompanied By: caregiver Had a fall or experienced change in No Transfer Assistance: None activities of daily living that may affect Patient Identification Verified: Yes risk of falls: Secondary Verification Process Yes Signs or symptoms of abuse/neglect since last No Completed: visito Patient Requires Transmission-Based No Hospitalized since last visit: No Precautions: Has Dressing in Place as Prescribed: Yes Patient Has Alerts: No Pain Present Now: No Electronic Signature(s) Signed: 12/22/2016 1:03:30 PM By: Sharon Mt Previous Signature: 11/28/2016 4:07:04 PM Version By: Alric Quan Entered By: Sharon Mt on 12/04/2016 16:03:47 [name], [name] ([id number]) -------------------------------------------------------------------------------- Clinic Level of Care Assessment Details Patient Name: [name] Date of Service: 11/28/2016 9:45 AM Medical Record Number: [id number] Patient Account Number: [id number] Date of Birth/Sex: 1940-11-26 (76 y.o. Male) Treating RN: Ahmed Prima Primary Care Ayelet Gruenewald: [name] Other Clinician: Referring Finnlee Guarnieri: [name] Treating Pascale Maves/Extender: Frann Rider in Treatment: 9 Clinic Level of Care Assessment Items TOOL 4 Quantity Score X - Use when  only an EandM is performed on FOLLOW-UP visit 1 0 ASSESSMENTS - Nursing Assessment / Reassessment X - Reassessment of Co-morbidities (includes updates in patient status) 1 10 X - Reassessment of Adherence to Treatment Plan 1 5 ASSESSMENTS - Wound and Skin Assessment / Reassessment X - Simple Wound Assessment / Reassessment - one wound 1 5 []  - Complex Wound Assessment / Reassessment - multiple wounds 0 []  - Dermatologic / Skin Assessment (not related to wound area) 0 ASSESSMENTS - Focused Assessment []  - Circumferential Edema Measurements - multi extremities 0 []  - Nutritional Assessment / Counseling / Intervention 0 []  - Lower Extremity Assessment (monofilament, tuning fork, pulses) 0 []  - Peripheral Arterial Disease Assessment (using hand [name] doppler) 0 ASSESSMENTS - Ostomy and/or Continence Assessment and Care []  - Incontinence Assessment and Management 0 []  - Ostomy Care Assessment and Management (repouching, etc.) 0 PROCESS - Coordination of Care X - Simple Patient / Family Education for ongoing care 1 15 []  - Complex (extensive) Patient / Family Education for ongoing care 0 []  - Staff obtains Programmer, systems, Records, Test Results / Process Orders 0 []  - Staff telephones HHA, Nursing Homes / Clarify orders / etc 0 []  - Routine Transfer to another Facility (non-emergent condition) 0 [name], [name] ([id number]) []  - Routine Hospital Admission (non-emergent condition) 0 []  - New Admissions / Biomedical engineer / Ordering NPWT, Apligraf, etc. 0 []  - Emergency Hospital Admission (emergent condition) 0 X - Simple Discharge Coordination 1 10 []  - Complex (extensive) Discharge Coordination 0 PROCESS - Special Needs []  - Pediatric / Minor Patient Management 0 []  - Isolation Patient Management 0 []  - Hearing / Language / Visual special needs 0 []  - Assessment of Community assistance (transportation, [name]/C planning, etc.) 0 []  - Additional assistance / Altered mentation 0 []  - Support  Surface(s) Assessment (bed, cushion, seat, etc.) 0 INTERVENTIONS - Wound Cleansing / Measurement X - Simple Wound Cleansing - one wound 1 5 []  - Complex Wound Cleansing -  multiple wounds 0 X - Wound Imaging (photographs - any number of wounds) 1 5 []  - Wound Tracing (instead of photographs) 0 []  - Simple Wound Measurement - one wound 0 []  - Complex Wound Measurement - multiple wounds 0 INTERVENTIONS - Wound Dressings []  - Small Wound Dressing one or multiple wounds 0 []  - Medium Wound Dressing one or multiple wounds 0 []  - Large Wound Dressing one or multiple wounds 0 []  - Application of Medications - topical 0 []  - Application of Medications - injection 0 INTERVENTIONS - Miscellaneous []  - External ear exam 0 Jeremy, [name] ([id number]) []  - Specimen Collection (cultures, biopsies, blood, body fluids, etc.) 0 []  - Specimen(s) / Culture(s) sent or taken to Lab for analysis 0 []  - Patient Transfer (multiple staff / Harrel Lemon Lift / Similar devices) 0 []  - Simple Staple / Suture removal (25 or less) 0 []  - Complex Staple / Suture removal (26 or more) 0 []  - Hypo / Hyperglycemic Management (close monitor of Blood Glucose) 0 []  - Ankle / Brachial Index (ABI) - do not check if billed separately 0 X - Vital Signs 1 5 Has the patient been seen at the hospital within the last three years: Yes Total Score: 60 Level Of Care: New/Established - Level 2 Electronic Signature(s) Signed: 11/28/2016 4:07:04 PM By: Alric Quan Entered By: Alric Quan on 11/28/2016 11:30:57 [name], [name] ([id number]) -------------------------------------------------------------------------------- Encounter Discharge Information Details Patient Name: [name] Date of Service: 11/28/2016 9:45 AM Medical Record Number: [id number] Patient Account Number: [id number] Date of Birth/Sex: 1941/03/21 (76 y.o. Male) Treating RN: Ahmed Prima Primary Care Avry Roedl: [name] Other Clinician: Referring  Margy Sumler: [name] Treating Tiffanyann Deroo/Extender: Frann Rider in Treatment: 9 Encounter Discharge Information Items Discharge Pain Level: 0 Discharge Condition: Stable Ambulatory Status: Ambulatory Discharge Destination: Home Transportation: Private Auto Accompanied By: neice Schedule Follow-up Appointment: No Medication Reconciliation completed and provided to Patient/Care No Sanari Offner: Provided on Clinical Summary of Care: 11/28/2016 Form Type Recipient Paper Patient [facility] Electronic Signature(s) Signed: 12/22/2016 1:03:30 PM By: Sharon Mt Previous Signature: 12/01/2016 9:40:42 AM Version By: Ruthine Dose Entered By: Sharon Mt on 12/04/2016 64:40:34 [name], [name] ([id number]) -------------------------------------------------------------------------------- Lower Extremity Assessment Details Patient Name: [name] Date of Service: 11/28/2016 9:45 AM Medical Record Number: [id number] Patient Account Number: [id number] Date of Birth/Sex: [birth year] (76 y.o. Male) Treating RN: Ahmed Prima Primary Care Dara Beidleman: [name] Other Clinician: Referring Jemarion Roycroft: [name] Treating Benson Porcaro/Extender: Frann Rider in Treatment: 9 Vascular Assessment Pulses: Dorsalis Pedis Palpable: [Right:Yes] Posterior Tibial Extremity colors, hair growth, and conditions: Extremity Color: [Right:Normal] Temperature of Extremity: [Right:Warm] Capillary Refill: [Right:< 3 seconds] Toe Nail Assessment Left: Right: Thick: Yes Discolored: Yes Deformed: No Improper Length and Hygiene: No Electronic Signature(s) Signed: 12/09/2016 5:28:39 PM By: Alric Quan Signed: 12/22/2016 1:03:30 PM By: Sharon Mt Previous Signature: 11/28/2016 4:07:04 PM Version By: Alric Quan Entered By: Sharon Mt on 12/04/2016 16:04:14 [name], [name] ([id number]) -------------------------------------------------------------------------------- Multi Wound Chart  Details Patient Name: [name] Date of Service: 11/28/2016 9:45 AM Medical Record Number: [id number] Patient Account Number: [id number] Date of Birth/Sex: [birth year] (76 y.o. Male) Treating RN: Ahmed Prima Primary Care Rodd Heft: [name] Other Clinician: Referring Donyetta Ogletree: [name] Treating Cagney Degrace/Extender: Frann Rider in Treatment: 9 Vital Signs Height(in): Pulse(bpm): 41 Weight(lbs): 123.8 Blood Pressure 197/60 (mmHg): Body Mass Index(BMI): Temperature(F): Respiratory Rate 16 (breaths/min): Photos: [N/A:N/A] Wound Location: Left Foot - Plantar N/A N/A Wounding Event: Gradually Appeared N/A N/A Primary Etiology: Trauma, Other  N/A N/A Comorbid History: Anemia, Chronic N/A N/A Obstructive Pulmonary Disease (COPD), Arrhythmia, Hypertension, Dementia, Seizure Disorder Date Acquired: 09/12/2016 N/A N/A Weeks of Treatment: 9 N/A N/A Wound Status: Healed - Epithelialized N/A N/A Pending Amputation on Yes N/A N/A Presentation: Measurements L x W x [name] 0x0x0 N/A N/A (cm) Area (cm) : 0 N/A N/A Volume (cm) : 0 N/A N/A % Reduction in Area: 100.00% N/A N/A % Reduction in Volume: 100.00% N/A N/A Classification: Partial Thickness N/A N/A Exudate Amount: None Present N/A N/A Wound Margin: Distinct, outline attached N/A N/A Varas, Hillary [name]. ([id number]) Granulation Amount: None Present (0%) N/A N/A Necrotic Amount: None Present (0%) N/A N/A Exposed Structures: Fat Layer (Subcutaneous N/A N/A Tissue) Exposed: Yes Fascia: No Tendon: No Muscle: No Joint: No Bone: No Epithelialization: Large (67-100%) N/A N/A Periwound Skin Texture: Callus: Yes N/A N/A Excoriation: No Induration: No Crepitus: No Rash: No Scarring: No Periwound Skin Maceration: No N/A N/A Moisture: Dry/Scaly: No Periwound Skin Color: Atrophie Blanche: No N/A N/A Cyanosis: No Ecchymosis: No Erythema: No Hemosiderin Staining: No Mottled: No Pallor: No Rubor:  No Temperature: No Abnormality N/A N/A Tenderness on Yes N/A N/A Palpation: Wound Preparation: Ulcer Cleansing: N/A N/A Rinsed/Irrigated with Saline Topical Anesthetic Applied: None Treatment Notes Electronic Signature(s) Signed: 12/22/2016 1:03:30 PM By: Sharon Mt Previous Signature: 11/28/2016 11:54:07 AM Version By: Christin Fudge MD, FACS Entered By: Sharon Mt on 12/04/2016 16:04:40 ANGAD, [name] ([id number]) -------------------------------------------------------------------------------- Multi-Disciplinary Care Plan Details Patient Name: [name] Date of Service: 11/28/2016 9:45 AM Medical Record Number: [id number] Patient Account Number: [id number] Date of Birth/Sex: Jun 24, 1940 (76 y.o. Male) Treating RN: Ahmed Prima Primary Care Tekelia Kareem: [name] Other Clinician: Referring Kendahl Bumgardner: [name] Treating Shuree Brossart/Extender: Frann Rider in Treatment: 9 Active Inactive Electronic Signature(s) Signed: 12/09/2016 5:28:39 PM By: Alric Quan Signed: 12/22/2016 1:03:30 PM By: Sharon Mt Previous Signature: 11/28/2016 4:07:04 PM Version By: Alric Quan Entered By: Sharon Mt on 12/04/2016 16:04:23 [name], [name] ([id number]) -------------------------------------------------------------------------------- Pain Assessment Details Patient Name: [name], [name] Date of Service: 11/28/2016 9:45 AM Medical Record Number: [id number] Patient Account Number: [id number] Date of Birth/Sex: Jul 20, 1940 (76 y.o. Male) Treating RN: Ahmed Prima Primary Care Harce Volden: [name] Other Clinician: Referring Floyed Masoud: [name] Treating Quamere Mussell/Extender: Frann Rider in Treatment: 9 Active Problems Location of Pain Severity and Description of Pain Patient Has Paino No Site Locations With Dressing Change: No Pain Management and Medication Current Pain Management: Electronic Signature(s) Signed: 12/09/2016 5:28:39 PM By: Alric Quan Signed: 12/22/2016 1:03:30 PM By: Sharon Mt Previous Signature: 11/28/2016 4:07:04 PM Version By: Alric Quan Entered By: Sharon Mt on 12/04/2016 16:03:57 Sublette, Lason [name]. ([id number]) -------------------------------------------------------------------------------- Patient/Caregiver Education Details Patient Name: [name] Date of Service: 11/28/2016 9:45 AM Medical Record Number: [id number] Patient Account Number: [id number] Date of Birth/Gender: [birth year] (76 y.o. Male) Treating RN: Ahmed Prima Primary Care Physician: [name] Other Clinician: Referring Physician: [name] Treating Physician/Extender: Frann Rider in Treatment: 9 Education Assessment Education Provided To: Patient and Caregiver Education Topics Provided Wound/Skin Impairment: Handouts: Other: Please call us if you have any questions or concerns. Methods: Explain/Verbal Responses: State content correctly Electronic Signature(s) Signed: 12/22/2016 1:03:30 PM By: Sharon Mt Previous Signature: 11/28/2016 4:07:04 PM Version By: Alric Quan Entered By: Sharon Mt on 12/04/2016 16:06:26 [name], [name] ([id number]) -------------------------------------------------------------------------------- Wound Assessment Details Patient Name: [name] Date of Service: 11/28/2016 9:45 AM Medical Record Number: [id number] Patient Account Number: [id number] Date of Birth/Sex: [birth year] (76 y.o. Male) Treating RN:  Carolyne Fiscal, Debi Primary Care Dmya Long: [name] Other Clinician: Referring Biff Rutigliano: [name] Treating Karis Emig/Extender: Melburn Hake, HOYT Weeks in Treatment: 9 Wound Status Wound Number: 3 Primary Trauma, Other Etiology: Wound Location: Left Foot - Plantar Wound Healed - Epithelialized Wounding Event: Gradually Appeared Status: Date Acquired: 09/12/2016 Comorbid Anemia, Chronic Obstructive Pulmonary Weeks Of Treatment: 9 History: Disease (COPD),  Arrhythmia, Clustered Wound: No Hypertension, Dementia, Seizure Pending Amputation On Presentation Disorder Photos Photo Uploaded By: Alric Quan on 11/28/2016 16:05:15 Wound Measurements Length: (cm) 0 % Reductio Width: (cm) 0 % Reductio Depth: (cm) 0 Epithelial Area: (cm) 0 Tunneling Volume: (cm) 0 Undermini n in Area: 100% n in Volume: 100% ization: Large (67-100%) : No ng: No Wound Description Classification: Partial Thickness Wound Margin: Distinct, outline attached Exudate Amount: None Present Foul Odor After Cleansing: No Slough/Fibrino No Wound Bed Granulation Amount: None Present (0%) Exposed Structure Necrotic Amount: None Present (0%) Fascia Exposed: No Fat Layer (Subcutaneous Tissue) Exposed: Yes Tendon Exposed: No Muscle Exposed: No [name], [name] ([id number]) Joint Exposed: No Bone Exposed: No Periwound Skin Texture Texture Color No Abnormalities Noted: No No Abnormalities Noted: No Callus: Yes Atrophie Blanche: No Crepitus: No Cyanosis: No Excoriation: No Ecchymosis: No Induration: No Erythema: No Rash: No Hemosiderin Staining: No Scarring: No Mottled: No Pallor: No Moisture Rubor: No No Abnormalities Noted: No Dry / Scaly: No Temperature / Pain Maceration: No Temperature: No Abnormality Tenderness on Palpation: Yes Wound Preparation Ulcer Cleansing: Rinsed/Irrigated with Saline Topical Anesthetic Applied: None Electronic Signature(s) Signed: 11/28/2016 4:07:04 PM By: Alric Quan Entered By: Alric Quan on 11/28/2016 10:13:29 [name], [name] ([id number]) -------------------------------------------------------------------------------- Vitals Details Patient Name: [name] Date of Service: 11/28/2016 9:45 AM Medical Record Number: [id number] Patient Account Number: [id number] Date of Birth/Sex: 1940/08/10 (76 y.o. Male) Treating RN: Ahmed Prima Primary Care Keelee Yankey: [name] Other  Clinician: Referring Darshay Deupree: [name] Treating Osmany Azer/Extender: Frann Rider in Treatment: 9 Vital Signs Time Taken: 09:57 Pulse (bpm): 41 Weight (lbs): 123.8 Respiratory Rate (breaths/min): 16 Blood Pressure (mmHg): 197/60 Reference Range: 80 - 120 mg / dl Notes Made Dr. Con Memos aware of BP. Electronic Signature(s) Signed: 12/22/2016 1:03:30 PM By: Sharon Mt Previous Signature: 11/28/2016 4:07:04 PM Version By: Alric Quan Entered By: Sharon Mt on 12/04/2016 16:04:05

## 2016-12-24 ENCOUNTER — Ambulatory Visit: Payer: Medicare Other | Admitting: Nurse Practitioner

## 2016-12-29 ENCOUNTER — Other Ambulatory Visit: Payer: Self-pay | Admitting: *Deleted

## 2016-12-29 DIAGNOSIS — F0391 Unspecified dementia with behavioral disturbance: Secondary | ICD-10-CM

## 2016-12-29 MED ORDER — MEMANTINE HCL-DONEPEZIL HCL ER 28-10 MG PO CP24: 1.0000 | ORAL_CAPSULE | Freq: Every day | ORAL | 3 refills | Status: DC

## 2016-12-29 NOTE — Telephone Encounter (Signed)
[name] called and requested Rx to be sent to pharmacy for Namzaric. Stated that the pharmacy did not receive Rx. Refaxed.

## 2017-01-23 ENCOUNTER — Ambulatory Visit: Payer: Medicare Other | Admitting: Pulmonary Disease

## 2017-01-27 ENCOUNTER — Other Ambulatory Visit: Payer: Self-pay | Admitting: Nurse Practitioner

## 2017-01-27 DIAGNOSIS — I480 Paroxysmal atrial fibrillation: Secondary | ICD-10-CM

## 2017-01-29 ENCOUNTER — Other Ambulatory Visit: Payer: Self-pay | Admitting: Nurse Practitioner

## 2017-01-29 ENCOUNTER — Telehealth: Payer: Self-pay | Admitting: *Deleted

## 2017-01-29 MED ORDER — MEMANTINE HCL 10 MG PO TABS: 10.0000 mg | ORAL_TABLET | Freq: Two times a day (BID) | ORAL | 3 refills | Status: DC

## 2017-01-29 MED ORDER — THIAMINE HCL 100 MG PO TABS: 100.0000 mg | ORAL_TABLET | Freq: Every day | ORAL | 3 refills | Status: DC

## 2017-01-29 MED ORDER — DONEPEZIL HCL 10 MG PO TABS: 10.0000 mg | ORAL_TABLET | Freq: Every day | ORAL | 3 refills | Status: DC

## 2017-01-29 NOTE — Telephone Encounter (Signed)
Can use namenda 10 mg by mouth twice daily and aricept 10 mg daily as a substitute if he would like to try this.

## 2017-01-29 NOTE — Telephone Encounter (Signed)
Beverely Low called and stated that Namzaric is too expensive and patient wants something cheaper called in. Stated that $30.00/month is too much. Please Advise.

## 2017-01-29 NOTE — Telephone Encounter (Signed)
Tried calling caregiver back but mailbox is full and cannot leave a VM. Will try again later.

## 2017-01-29 NOTE — Telephone Encounter (Signed)
Beverely Low Notified and agreed. Faxed to pharmacy. Caregiver also requested a Rx sent to pharmacy for Vitamin B1. Faxed.

## 2017-02-02 ENCOUNTER — Ambulatory Visit (INDEPENDENT_AMBULATORY_CARE_PROVIDER_SITE_OTHER): Payer: Medicare Other | Admitting: Nurse Practitioner

## 2017-02-02 ENCOUNTER — Encounter: Payer: Self-pay | Admitting: Nurse Practitioner

## 2017-02-02 VITALS — BP 142/78 | HR 63 | Temp 97.9°F | Resp 18 | Ht 68.0 in | Wt 126.4 lb

## 2017-02-02 DIAGNOSIS — N183 Chronic kidney disease, stage 3 unspecified: Secondary | ICD-10-CM

## 2017-02-02 DIAGNOSIS — F0391 Unspecified dementia with behavioral disturbance: Secondary | ICD-10-CM | POA: Diagnosis not present

## 2017-02-02 DIAGNOSIS — R634 Abnormal weight loss: Secondary | ICD-10-CM | POA: Diagnosis not present

## 2017-02-02 DIAGNOSIS — R600 Localized edema: Secondary | ICD-10-CM

## 2017-02-02 DIAGNOSIS — F1010 Alcohol abuse, uncomplicated: Secondary | ICD-10-CM | POA: Diagnosis not present

## 2017-02-02 DIAGNOSIS — I480 Paroxysmal atrial fibrillation: Secondary | ICD-10-CM

## 2017-02-02 DIAGNOSIS — I10 Essential (primary) hypertension: Secondary | ICD-10-CM

## 2017-02-02 LAB — COMPLETE METABOLIC PANEL WITH GFR
AG RATIO: 1.2 (calc) (ref 1.0–2.5)
ALKALINE PHOSPHATASE (APISO): 72 U/L (ref 40–115)
ALT: 15 U/L (ref 9–46)
AST: 19 U/L (ref 10–35)
Albumin: 3.8 g/dL (ref 3.6–5.1)
BILIRUBIN TOTAL: 0.3 mg/dL (ref 0.2–1.2)
BUN / CREAT RATIO: 17 (calc) (ref 6–22)
BUN: 29 mg/dL — ABNORMAL HIGH (ref 7–25)
CO2: 28 mmol/L (ref 20–32)
Calcium: 9 mg/dL (ref 8.6–10.3)
Chloride: 104 mmol/L (ref 98–110)
Creat: 1.74 mg/dL — ABNORMAL HIGH (ref 0.70–1.18)
GFR, EST AFRICAN AMERICAN: 43 mL/min/{1.73_m2} — AB (ref >=60)
GFR, Est Non African American: 37 mL/min/{1.73_m2} — ABNORMAL LOW (ref >=60)
GLOBULIN: 3.2 g/dL (ref 1.9–3.7)
Glucose, Bld: 89 mg/dL (ref 65–99)
Potassium: 4.6 mmol/L (ref 3.5–5.3)
SODIUM: 139 mmol/L (ref 135–146)
Total Protein: 7 g/dL (ref 6.1–8.1)

## 2017-02-02 NOTE — Patient Instructions (Addendum)
STOP ARICEPT (this may help diarrhea as well)   May use benefiber to help bulk up stool  Do not restart drinking beer.   Follow up in 4 weeks If you can take blood pressure at heart rate at home this would be beneficial and bring numbers to next visit.

## 2017-02-02 NOTE — Progress Notes (Signed)
Careteam: Patient Care Team: [name], NP as PCP - General (Nurse Practitioner)  Advanced Directive information Does Patient Have a Medical Advance Directive?: No  Allergies  Allergen Reactions  . Diltiazem Rash and Other (See Comments)    Blisters  . Penicillins Other (See Comments)    Unknown Childhood allergy  Has patient had a PCN reaction causing immediate rash, facial/tongue/throat swelling, SOB or lightheadedness with hypotension: unknown Has patient had a PCN reaction causing severe rash involving mucus membranes or skin necrosis: unknown Has patient had a PCN reaction that required hospitalization unknown Has patient had a PCN reaction occurring within the last 10 years:no If all of the above answers are "NO", then may proceed with Cephalosporin use.     Chief Complaint  Patient presents with  . Follow-up    Pt is being seen for a follow up. Pt has had sinus congestion, cough, loose stool, and pt does not want to eat much for several weeks.   . Other    caregiver, Manya Silvas, in room.      HPI: Patient is a 76 y.o. male seen in the office today for follow up on blood pressure and HR. At last visit coreg was decreased due to bradycardia.  Noted to have elevated blood pressure at cardiologist appt however had eaten diet high in sodium.  Caregiver reports he does not want to eat much but has gained 2 lbs. No edema noted.  Having loose stools but does not eat a well balanced diet.  somedays he eats better than others.  Reports he is stubborn and just will not eat things at time.  Some days he wants just fried foods Some days he will not eat anything but cereal. Has tried encouraging healthy and well balanced and he states "that is wrong for me" namzaric was too expensive therefore it was separated and most more affordable.   After last visit his BMP was obtained and BUN/Cr was increased therefore lasix was decreased to 20 mg daily, no increase in  edema  Reports he would like to start drinking beer again.   Review of Systems:  Review of Systems  Unable to perform ROS: Dementia    Past Medical History:  Diagnosis Date  . Alcohol abuse   . Anemia   . Aneurysm ([place])   . CKD (chronic kidney disease), stage III ([place])   . COPD (chronic obstructive pulmonary disease) ([place])   . Dysrhythmia   . Fever of unknown origin 08/2016  . History of atrial fibrillation   . Hyperchloremia   . Hypercholesteremia   . Hyperpotassemia   . Hypertension   . Hypertensive renal disease, benign   . Leg edema   . PAD (peripheral artery disease) ([place])   . Pneumonia   . Pressure ulcer of left heel   . Seizures ([place])    has not had a seizure in 15 yrs  . Tobacco use   . Weight loss    Past Surgical History:  Procedure Laterality Date  . ABDOMINAL AORTOGRAM W/LOWER EXTREMITY N/A 08/13/2016   Procedure: Abdominal Aortogram w/Lower Extremity;  Surgeon: Wellington Hampshire, MD;  Location: [facility];  Service: Cardiovascular;  Laterality: N/A;  . 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, 1996  . EYE SURGERY     December 2017  . PERIPHERAL VASCULAR BALLOON ANGIOPLASTY  08/13/2016   Procedure: Peripheral Vascular Balloon Angioplasty;  Surgeon: Wellington Hampshire, MD;  Location: [facility];  Service:  Cardiovascular;;  Aborted   Social History:   reports that he has quit smoking. His smoking use included Cigarettes. He has a 14.00 pack-year smoking history. He has never used smokeless tobacco. He reports that he does not drink alcohol or use drugs.  Family History  Problem Relation Age of Onset  . Cancer Mother   . Congestive Heart Failure Father   . Alzheimer's disease Sister   . Congestive Heart Failure Sister   . Clotting disorder Sister   . Congestive Heart Failure Brother   . Clotting disorder Sister     Medications: Patient's Medications  New Prescriptions   No medications on file  Previous Medications   ASPIRIN EC 81 MG TABLET     Take 1 tablet (81 mg total) by mouth daily.   ATORVASTATIN (LIPITOR) 10 MG TABLET    TAKE 1 TABLET BY MOUTH EVERY DAY   CARVEDILOL (COREG) 3.125 MG TABLET    TAKE 1 TABLET (3.125 MG TOTAL) BY MOUTH 2 (TWO) TIMES DAILY WITH A MEAL.   DIVALPROEX (DEPAKOTE) 250 MG DR TABLET    TAKE 1 TABLET BY MOUTH 3 TIMES A DAY FOR SEIZURES   DONEPEZIL (ARICEPT) 10 MG TABLET    Take 1 tablet (10 mg total) by mouth at bedtime.   FUROSEMIDE (LASIX) 20 MG TABLET    Take 1 tablet (20 mg total) by mouth daily.   GUAIFENESIN (ROBITUSSIN) 100 MG/5ML LIQUID    Take 10 mg by mouth 3 (three) times daily as needed for cough.   LEVETIRACETAM (KEPPRA) 500 MG TABLET    Take one tablet by mouth twice daily   MEMANTINE (NAMENDA) 10 MG TABLET    Take 1 tablet (10 mg total) by mouth 2 (two) times daily.   MULTIPLE VITAMINS-MINERALS (THEREMS-M) TABS    Take 1 tablet by mouth daily.   THIAMINE (CVS B-1) 100 MG TABLET    Take 1 tablet (100 mg total) by mouth daily.  Modified Medications   No medications on file  Discontinued Medications   No medications on file     Physical Exam:  Vitals:   02/02/17 0949  BP: (!) 142/78  Pulse: 63  Resp: 18  Temp: 97.9 F (36.6 C)  TempSrc: Oral  SpO2: 96%  Weight: 126 lb 6.4 oz (57.3 kg)  Height: '5\' 8"'  (1.727 m)   Body mass index is 19.22 kg/m.  Physical Exam  Constitutional: No distress.  Frail thin male NAD.   HENT:  Mouth/Throat: Oropharynx is clear and moist. No oropharyngeal exudate.  Cardiovascular: Normal rate and regular rhythm.   Pulmonary/Chest: Effort normal and breath sounds normal. No respiratory distress.  Abdominal: Soft. Bowel sounds are normal.  Musculoskeletal: Normal range of motion. He exhibits no edema or tenderness.  Neurological: He is alert.  Skin: Skin is warm. No rash noted.  Psychiatric: His affect is labile. Cognition and memory are impaired. He exhibits abnormal recent memory.       Labs reviewed: Basic Metabolic Panel:  Recent Labs   04/14/16 1612  08/19/16 1623 08/20/16 0417 10/15/16 1112 11/26/16 1050  NA 140  < >  --  140 140 140  K 3.8  < >  --  4.2 5.1 4.7  CL 103  < >  --  112* 106 103  CO2 26  < >  --  21* 22 24  GLUCOSE 156*  < >  --  101* 96 84  BUN 15  < >  --  27* 43* 34*  CREATININE 1.41*  < >  --  1.13 1.35* 1.38*  CALCIUM 9.3  < >  --  8.1* 9.2 9.3  MG 1.9  --   --   --   --   --   TSH 1.992  --  3.541  --   --   --   < > = values in this interval not displayed. Liver Function Tests:  Recent Labs  06/19/16 1051 08/18/16 1348 08/21/16 1144  AST '13 29 25  ' ALT 6* 22 20  ALKPHOS 64 55 56  BILITOT 0.3 0.5 0.5  PROT 6.7 6.8 6.6  ALBUMIN 3.3* 2.9* 2.3*   No results for input(s): LIPASE, AMYLASE in the last 8760 hours. No results for input(s): AMMONIA in the last 8760 hours. CBC:  Recent Labs  09/23/16 1200 10/22/16 0940 11/21/16 1126  WBC 4.9 5.9 6.4  NEUTROABS 2.0 1.6 2.2  HGB 9.6* 11.1* 11.7*  HCT 29.7* 34.6* 35.8*  MCV 84.8 88.0 87.7  PLT 180 150 146   Lipid Panel:  Recent Labs  04/15/16 0211  CHOL 164  HDL 103  LDLCALC 51  TRIG 48  CHOLHDL 1.6   TSH:  Recent Labs  04/14/16 1612 08/19/16 1623  TSH 1.992 3.541   A1C: Lab Results  Component Value Date   HGBA1C 5.2 04/15/2016     Assessment/Plan 1. Loss of weight Weight is trending up at this time despite pts poor appetite at times. Caregiver encouraging proper nutrition but he states he will eat what he wants. Dementia is playing a role in this.  - CMP with eGFR  2. Dementia with behavioral disturbance, unspecified dementia type -will stop aricept due to bradycardia and diarrhea  -cont on namanda only at this time.   3. Paroxysmal atrial fibrillation (HCC) Stable. HR 62 today will DC aricept as this could be adding to bradycardia.  Not a candidate for anticoagulation   4. Alcohol abuse Stated he would like to restart drinking beer, strongly discouraged against this.   5. Essential  hypertension Stable, will cont current regimen, encouraged low sodium diet.   6. Leg edema -lasix was cut back and without edema. Will follow up labs today - CMP with eGFR  7. CKD (chronic kidney disease) stage 3, GFR 30-59 ml/min (HCC) -Encourage proper hydration and to avoid NSAIDS (Aleve, Advil, Motrin, Ibuprofen)  - CMP with eGFR  Next appt: 4 weeks Genella Bas K. Harle Battiest  [facility] [phone] 8 am - 5 pm) [phone] (after hours)

## 2017-02-03 ENCOUNTER — Encounter: Payer: Self-pay | Admitting: Pulmonary Disease

## 2017-02-03 ENCOUNTER — Ambulatory Visit (INDEPENDENT_AMBULATORY_CARE_PROVIDER_SITE_OTHER): Payer: Medicare Other | Admitting: Pulmonary Disease

## 2017-02-03 ENCOUNTER — Other Ambulatory Visit: Payer: Self-pay | Admitting: Nurse Practitioner

## 2017-02-03 VITALS — BP 134/76 | HR 74 | Ht 68.0 in | Wt 125.0 lb

## 2017-02-03 DIAGNOSIS — R0602 Shortness of breath: Secondary | ICD-10-CM

## 2017-02-03 DIAGNOSIS — J449 Chronic obstructive pulmonary disease, unspecified: Secondary | ICD-10-CM | POA: Diagnosis not present

## 2017-02-03 LAB — PULMONARY FUNCTION TEST
DL/VA % pred: 75 %
DL/VA: 3.27 ml/min/mmHg/L
DLCO COR % PRED: 32 %
DLCO UNC: 7.93 ml/min/mmHg
DLCO cor: 8.77 ml/min/mmHg
DLCO unc % pred: 29 %
FEF 25-75 POST: 0.9 L/s
FEF 25-75 Pre: 0.52 L/sec
FEF2575-%CHANGE-POST: 71 %
FEF2575-%Pred-Post: 50 %
FEF2575-%Pred-Pre: 29 %
FEV1-%Change-Post: 17 %
FEV1-%PRED-POST: 40 %
FEV1-%PRED-PRE: 34 %
FEV1-POST: 0.91 L
FEV1-Pre: 0.77 L
FEV1FVC-%CHANGE-POST: 1 %
FEV1FVC-%Pred-Pre: 92 %
FEV6-%Change-Post: 21 %
FEV6-%PRED-PRE: 36 %
FEV6-%Pred-Post: 44 %
FEV6-PRE: 1.05 L
FEV6-Post: 1.28 L
FEV6FVC-%PRED-PRE: 106 %
FEV6FVC-%Pred-Post: 106 %
FVC-%CHANGE-POST: 15 %
FVC-%PRED-PRE: 35 %
FVC-%Pred-Post: 41 %
FVC-POST: 1.28 L
FVC-PRE: 1.1 L
POST FEV1/FVC RATIO: 71 %
POST FEV6/FVC RATIO: 100 %
PRE FEV1/FVC RATIO: 70 %
Pre FEV6/FVC Ratio: 100 %

## 2017-02-03 NOTE — Progress Notes (Signed)
[name]    [id number]    [birth year]  Primary Care Physician:Eubanks, Carlos American, NP  Referring Physician: [name], NP [address], [place] [address]  Chief complaint:   Follow-up for left lower lobe consolidation COPD GOLD B  HPI: 76 year old with history of PVD, COPD GOLD B (CAT score 10, 0 exacerbations), hypertension, hyperlipidemia, active tobacco and alcohol use, seizure disorder, brain aneurysm.   He was admitted to hospital on 08/18/16 with cough, fever, rash, confusion, elevated LA, AKI. He was treated for LLL pneumonia with vanco, cefepime initially, This has been changed to oral vantin on 5/16. 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  During the admission a  CT abdomen pelvis and a CT chest which showed left lower lobe consolidation with narrowing of lower lobe atelectasis, mediastinal adenopathy. PCCM was consulted for further management and recommended to treat PNA and repeat CT scan. He returns to the clinic today after having completed antibiotic course. His repeat CT scan shows resolution of the abnormalities. He feels well with no cough, sputum production, fevers, chills. He has mild dyspnea on exertion and is not on any inhalers.   Interim history: He has quit smoking since our last visit.  He is doing well with no respiratory complaints and does not require any inhalers.  Outpatient Encounter Prescriptions as of 02/03/2017  Medication Sig  . aspirin EC 81 MG tablet Take 1 tablet (81 mg total) by mouth daily.  Marland Kitchen atorvastatin (LIPITOR) 10 MG tablet TAKE 1 TABLET BY MOUTH EVERY DAY  . carvedilol (COREG) 3.125 MG tablet TAKE 1 TABLET (3.125 MG TOTAL) BY MOUTH 2 (TWO) TIMES DAILY WITH A MEAL.  Marland Kitchen divalproex (DEPAKOTE) 250 MG DR tablet TAKE 1 TABLET BY MOUTH 3 TIMES A DAY FOR SEIZURES  . guaiFENesin (ROBITUSSIN) 100 MG/5ML liquid Take 10 mg by mouth 3 (three)  times daily as needed for cough.  . levETIRAcetam (KEPPRA) 500 MG tablet Take one tablet by mouth twice daily  . memantine (NAMENDA) 10 MG tablet Take 1 tablet (10 mg total) by mouth 2 (two) times daily.  . Multiple Vitamins-Minerals (THEREMS-M) TABS Take 1 tablet by mouth daily.  Marland Kitchen thiamine (CVS B-1) 100 MG tablet Take 1 tablet (100 mg total) by mouth daily.  . [DISCONTINUED] donepezil (ARICEPT) 10 MG tablet Take 1 tablet (10 mg total) by mouth at bedtime.  . [DISCONTINUED] furosemide (LASIX) 20 MG tablet Take 1 tablet (20 mg total) by mouth daily.   No facility-administered encounter medications on file as of 02/03/2017.     Allergies as of 02/03/2017 - Review Complete 02/03/2017  Allergen Reaction Noted  . Diltiazem Rash and Other (See Comments) 05/08/2016  . Penicillins Other (See Comments) 07/28/2012    Past Medical History:  Diagnosis Date  . Alcohol abuse   . Anemia   . Aneurysm ([place])   . CKD (chronic kidney disease), stage III ([place])   . COPD (chronic obstructive pulmonary disease) ([place])   . Dysrhythmia   . Fever of unknown origin 08/2016  . History of atrial fibrillation   . Hyperchloremia   . Hypercholesteremia   . Hyperpotassemia   . Hypertension   . Hypertensive renal disease, benign   . Leg edema   . PAD (peripheral artery disease) ([place])   . Pneumonia   . Pressure ulcer of left heel   . Seizures ([place])  has not had a seizure in 15 yrs  . Tobacco use   . Weight loss     Past Surgical History:  Procedure Laterality Date  . ABDOMINAL AORTOGRAM W/LOWER EXTREMITY N/A 08/13/2016   Procedure: Abdominal Aortogram w/Lower Extremity;  Surgeon: Wellington Hampshire, MD;  Location: [facility];  Service: Cardiovascular;  Laterality: N/A;  . 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, 1996  . EYE SURGERY     December 2017  . PERIPHERAL VASCULAR BALLOON ANGIOPLASTY  08/13/2016   Procedure: Peripheral Vascular Balloon Angioplasty;  Surgeon: Wellington Hampshire, MD;  Location: [facility];  Service: Cardiovascular;;  Aborted    Family History  Problem Relation Age of Onset  . Cancer Mother   . Congestive Heart Failure Father   . Alzheimer's disease Sister   . Congestive Heart Failure Sister   . Clotting disorder Sister   . Congestive Heart Failure Brother   . Clotting disorder Sister     Social History   Social History  . Marital status: Single    Spouse name: N/A  . Number of children: N/A  . Years of education: N/A   Occupational History  . Not on file.   Social History Main Topics  . Smoking status: Former Smoker    Packs/day: 0.25    Years: 56.00    Types: Cigarettes  . Smokeless tobacco: Never Used     Comment: quit smoking 2 months ago  . Alcohol use No     Comment: QUIT DRINKING  04/2016  . Drug use: No  . Sexual activity: Not Currently   Other Topics Concern  . Not on file   Social History Narrative  . No narrative on file    Review of systems: Review of Systems  Constitutional: Negative for fever and chills.  HENT: Negative.   Eyes: Negative for blurred vision.  Respiratory: as per HPI  Cardiovascular: Negative for chest pain and palpitations.  Gastrointestinal: Negative for vomiting, diarrhea, blood per rectum. Genitourinary: Negative for dysuria, urgency, frequency and hematuria.  Musculoskeletal: Negative for myalgias, back pain and joint pain.  Skin: Negative for itching and rash.  Neurological: Negative for dizziness, tremors, focal weakness, seizures and loss of consciousness.  Endo/Heme/Allergies: Negative for environmental allergies.  Psychiatric/Behavioral: Negative for depression, suicidal ideas and hallucinations.  All other systems reviewed and are negative.  Physical Exam: Blood pressure 134/76, pulse 74, height '5\' 8"'  (1.727 m), weight 125 lb (56.7 kg), SpO2 100 %. Gen:      No acute distress HEENT:  EOMI, sclera anicteric Neck:     No masses; no thyromegaly Lungs:    Clear to auscultation  bilaterally; normal respiratory effort CV:         Regular rate and rhythm; no murmurs Abd:      + bowel sounds; soft, non-tender; no palpable masses, no distension Ext:    No edema; adequate peripheral perfusion Skin:      Warm and dry; no rash Neuro: alert and oriented x 3 Psych: normal mood and affect  Data Reviewed: CT abdomen pelvis 08/20/16- left lower lobe consolidation with small effusion. CT chest 08/22/16- ill-defined left perihilar soft tissue density with bronchial narrowing in posterobasal segments, left lower lobe consolidation. CT chest scan 10/17/16- resolution of left lower lobe consolidation, pleural effusion. Aortic and coronary atherosclerosis. Mild centrilobular and paraseptal emphysema. I have reviewed images personally.  PFTs 02/03/17 FVC 1.28 [41%], FEV1 0.91 (40%], F/F 71, TLC 47, RV/TLC 174, DLCO  29% Severe obstruction with restriction and diffusion impairment, air trapping  Assessment:  Follow up for LLL pneumonia Follow-up CT shows resolution of the pneumonia with no residual lung infiltrate, mass, nodule.    COPD GOLD B PFTs reviewed which show significant obstruction with restriction and diffusion impairment but he continues to be asymptomatic. We discussed inhalers but since he is doing well we will continue to observe him off treatment. He will need oxygen assessment on exertion.  He is in a hurry to leave today and will assess at return visit.    Ex smoker Quit smoking 2 months ago.   Plan/Recommendations: -Monitor symptoms off inhalers   Marshell Garfinkel MD [facility] Pager [phone] 02/03/2017, 4:26 PM  CC: [name], NP

## 2017-02-03 NOTE — Patient Instructions (Signed)
PFT done today. 

## 2017-02-03 NOTE — Patient Instructions (Addendum)
I am glad that you stopped smoking We will continue to observe you off all inhalers Follow-up in 6 months.  Please call us if there is any change in your symptoms.

## 2017-02-12 ENCOUNTER — Other Ambulatory Visit: Payer: Self-pay | Admitting: Nurse Practitioner

## 2017-03-09 ENCOUNTER — Ambulatory Visit: Payer: Medicare Other | Admitting: Nurse Practitioner

## 2017-03-09 ENCOUNTER — Other Ambulatory Visit: Payer: Medicare Other

## 2017-03-10 ENCOUNTER — Encounter: Payer: Self-pay | Admitting: Nurse Practitioner

## 2017-03-10 ENCOUNTER — Ambulatory Visit: Payer: Medicare Other | Admitting: Nurse Practitioner

## 2017-03-10 VITALS — BP 132/80 | HR 61 | Temp 98.6°F | Resp 17 | Ht 68.0 in | Wt 131.8 lb

## 2017-03-10 DIAGNOSIS — I10 Essential (primary) hypertension: Secondary | ICD-10-CM | POA: Diagnosis not present

## 2017-03-10 DIAGNOSIS — F03918 Unspecified dementia, unspecified severity, with other behavioral disturbance: Secondary | ICD-10-CM | POA: Insufficient documentation

## 2017-03-10 DIAGNOSIS — F0391 Unspecified dementia with behavioral disturbance: Secondary | ICD-10-CM

## 2017-03-10 DIAGNOSIS — N183 Chronic kidney disease, stage 3 unspecified: Secondary | ICD-10-CM

## 2017-03-10 DIAGNOSIS — I5032 Chronic diastolic (congestive) heart failure: Secondary | ICD-10-CM | POA: Diagnosis not present

## 2017-03-10 DIAGNOSIS — R634 Abnormal weight loss: Secondary | ICD-10-CM

## 2017-03-10 DIAGNOSIS — E785 Hyperlipidemia, unspecified: Secondary | ICD-10-CM | POA: Diagnosis not present

## 2017-03-10 LAB — BASIC METABOLIC PANEL WITH GFR
BUN/Creatinine Ratio: 19 (calc) (ref 6–22)
BUN: 28 mg/dL — AB (ref 7–25)
CO2: 30 mmol/L (ref 20–32)
Calcium: 9.2 mg/dL (ref 8.6–10.3)
Chloride: 110 mmol/L (ref 98–110)
Creat: 1.48 mg/dL — ABNORMAL HIGH (ref 0.70–1.18)
GFR, EST AFRICAN AMERICAN: 52 mL/min/{1.73_m2} — AB (ref >=60)
GFR, Est Non African American: 45 mL/min/{1.73_m2} — ABNORMAL LOW (ref >=60)
GLUCOSE: 91 mg/dL (ref 65–99)
Potassium: 4.5 mmol/L (ref 3.5–5.3)
Sodium: 145 mmol/L (ref 135–146)

## 2017-03-10 NOTE — Progress Notes (Signed)
Careteam: Patient Care Team: [name], NP as PCP - General (Nurse Practitioner)  Advanced Directive information Does Patient Have a Medical Advance Directive?: No  Allergies  Allergen Reactions  . Diltiazem Rash and Other (See Comments)    Blisters  . Penicillins Other (See Comments)    Unknown Childhood allergy  Has patient had a PCN reaction causing immediate rash, facial/tongue/throat swelling, SOB or lightheadedness with hypotension: unknown Has patient had a PCN reaction causing severe rash involving mucus membranes or skin necrosis: unknown Has patient had a PCN reaction that required hospitalization unknown Has patient had a PCN reaction occurring within the last 10 years:no If all of the above answers are "NO", then may proceed with Cephalosporin use.     Chief Complaint  Patient presents with  . Follow-up    Pt is being seen for a 4 week follow up on weight loss and dementia.   [name], in room     HPI: Patient is a 76 y.o. male seen in the office today to follow up HR and diarrhea. Pt was previously on aricept but due to bradycardia and diarrhea this was stopped. No significant changes in memory since Aricept stopped. conts on namenda 10 mg by mouth twice daily  Diarrhea has improved. Not using benefiber Weight up to 131 lbs from 125 lbs 1 month ago. Eating better.  HR remains unchanged at visit today, did not check at home.  Lasix was stopped because renal function had significantly worsen.  Reports he is only drinking 1-2 16 oz bottles of water a day No increase in swelling.  No shortness of breath or chest pains.    mini-mental status exam MMSE - Mini Mental State Exam 09/25/2016 04/12/2015 03/07/2014  Not completed: - (No Data) -  Orientation to time _0 Orientation to Place _1 Registration _2 Attention/ Calculation 0 4 3  Recall _3 Language- name 2 objects _4 Language- repeat _5 Language- follow 3  step command _6 Language- read & follow direction _7 Write a sentence 0 1 1  Copy design 1 0 1  Total score _8 Review of Systems:  Review of Systems  Unable to perform ROS: Dementia    Past Medical History:  Diagnosis Date  . Alcohol abuse   . Anemia   . Aneurysm ([place])   . CKD (chronic kidney disease), stage III ([place])   . COPD (chronic obstructive pulmonary disease) ([place])   . Dysrhythmia   . Fever of unknown origin 08/2016  . History of atrial fibrillation   . Hyperchloremia   . Hypercholesteremia   . Hyperpotassemia   . Hypertension   . Hypertensive renal disease, benign   . Leg edema   . PAD (peripheral artery disease) ([place])   . Pneumonia   . Pressure ulcer of left heel   . Seizures ([place])    has not had a seizure in 15 yrs  . Tobacco use   . Weight loss    Past Surgical History:  Procedure Laterality Date  . ABDOMINAL AORTOGRAM W/LOWER EXTREMITY N/A 08/13/2016   Procedure: Abdominal Aortogram w/Lower Extremity;  Surgeon: Wellington Hampshire, MD;  Location: [facility];  Service: Cardiovascular;  Laterality: N/A;  . 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, 1996  . EYE SURGERY  December 2017  . PERIPHERAL VASCULAR BALLOON ANGIOPLASTY  08/13/2016   Procedure: Peripheral Vascular Balloon Angioplasty;  Surgeon: Wellington Hampshire, MD;  Location: [facility];  Service: Cardiovascular;;  Aborted   Social History:   reports that he has quit smoking. His smoking use included cigarettes. He has a 14.00 pack-year smoking history. he has never used smokeless tobacco. He reports that he does not drink alcohol or use drugs.  Family History  Problem Relation Age of Onset  . Cancer Mother   . Congestive Heart Failure Father   . Alzheimer's disease Sister   . Congestive Heart Failure Sister   . Clotting disorder Sister   . Congestive Heart Failure Brother   . Clotting disorder Sister     Medications:   Medication List        Accurate as of  03/10/17  9:57 AM. Always use your most recent med list.          aspirin EC 81 MG tablet Take 1 tablet (81 mg total) by mouth daily.   atorvastatin 10 MG tablet Commonly known as:  LIPITOR TAKE 1 TABLET BY MOUTH EVERY DAY   carvedilol 3.125 MG tablet Commonly known as:  COREG TAKE 1 TABLET (3.125 MG TOTAL) BY MOUTH 2 (TWO) TIMES DAILY WITH A MEAL.   DELSYM COUGH RELIEF MT   divalproex 250 MG DR tablet Commonly known as:  DEPAKOTE TAKE 1 TABLET BY MOUTH 3 TIMES A DAY FOR SEIZURES   levETIRAcetam 500 MG tablet Commonly known as:  KEPPRA TAKE 1 TABLET BY MOUTH TWICE A DAY   memantine 10 MG tablet Commonly known as:  NAMENDA Take 1 tablet (10 mg total) by mouth 2 (two) times daily.   THEREMS-M Tabs Take 1 tablet by mouth daily.   thiamine 100 MG tablet Commonly known as:  CVS B-1 Take 1 tablet (100 mg total) by mouth daily.        Physical Exam:  Vitals:   03/10/17 0954  BP: 132/80  Pulse: 61  Resp: 17  Temp: 98.6 F (37 C)  TempSrc: Oral  SpO2: 98%  Weight: 131 lb 12.8 oz (59.8 kg)  Height: _0  (1.727 m)   Body mass index is 20.04 kg/m.  Physical Exam  Constitutional: No distress.  Frail thin male NAD.   HENT:  Mouth/Throat: Oropharynx is clear and moist. No oropharyngeal exudate.  Cardiovascular: Normal rate and regular rhythm.  Pulmonary/Chest: Effort normal and breath sounds normal. No respiratory distress.  Abdominal: Soft. Bowel sounds are normal.  Musculoskeletal: Normal range of motion. He exhibits no edema or tenderness.  Neurological: He is alert.  Skin: Skin is warm. No rash noted.  Psychiatric: His affect is labile. Cognition and memory are impaired. He exhibits abnormal recent memory.       Labs reviewed: Basic Metabolic Panel: Recent Labs    04/14/16 1612  08/19/16 1623  10/15/16 1112 11/26/16 1050 02/02/17 1012  NA 140   < >  --    < > 140 140 139  K 3.8   < >  --    < > 5.1 4.7 4.6  CL 103   < >  --    < > 106 103 104    CO2 26   < >  --    < > _1 GLUCOSE 156*   < >  --    < > 96 84 89  BUN 15   < >  --    < >  43* 34* 29*  CREATININE 1.41*   < >  --    < > 1.35* 1.38* 1.74*  CALCIUM 9.3   < >  --    < > 9.2 9.3 9.0  MG 1.9  --   --   --   --   --   --   TSH 1.992  --  3.541  --   --   --   --    < > = values in this interval not displayed.   Liver Function Tests: Recent Labs    06/19/16 1051 08/18/16 1348 08/21/16 1144 02/02/17 1012  AST _0 ALT 6* _1 ALKPHOS 64 55 56  --   BILITOT 0.3 0.5 0.5 0.3  PROT 6.7 6.8 6.6 7.0  ALBUMIN 3.3* 2.9* 2.3*  --    No results for input(s): LIPASE, AMYLASE in the last 8760 hours. No results for input(s): AMMONIA in the last 8760 hours. CBC: Recent Labs    09/23/16 1200 10/22/16 0940 11/21/16 1126  WBC 4.9 5.9 6.4  NEUTROABS 2.0 1.6 2.2  HGB 9.6* 11.1* 11.7*  HCT 29.7* 34.6* 35.8*  MCV 84.8 88.0 87.7  PLT 180 150 146   Lipid Panel: Recent Labs    04/15/16 0211  CHOL 164  HDL 103  LDLCALC 51  TRIG 48  CHOLHDL 1.6   TSH: Recent Labs    04/14/16 1612 08/19/16 1623  TSH 1.992 3.541   A1C: Lab Results  Component Value Date   HGBA1C 5.2 04/15/2016     Assessment/Plan 1. Essential hypertension, benign Blood pressure stable, will cont current regimen.   2. Hyperlipidemia LDL goal <130 LDL at goal, conts on lipitor 10 mg daily   3. CKD (chronic kidney disease), stage III ([place]) Had worsen on last lab due to poor oral intake. Encouraged to increase fluid. To avoid NSAIDS. - BMP with eGFR  4. Chronic diastolic heart failure (HCC) Stable, lasix has been stopped due to poor PO intake and decrease renal function. euvolemic today. conts on coreg.   5. Loss of weight Improved, overall he is eating better  6. Dementia with behavioral disturbance, unspecified dementia type Stable, diarrhea has improved off aricept. Will cont on namenda. No significant changes noted by caregivers.   Next appt: 3 months, sooner if  needed  Jessica K. Harle Battiest  [facility] [phone] 8 am - 5 pm) [phone] (after hours)

## 2017-03-19 ENCOUNTER — Other Ambulatory Visit: Payer: Self-pay | Admitting: *Deleted

## 2017-03-19 ENCOUNTER — Other Ambulatory Visit: Payer: Self-pay | Admitting: Nurse Practitioner

## 2017-03-19 MED ORDER — DIVALPROEX SODIUM 250 MG PO DR TAB: DELAYED_RELEASE_TABLET | ORAL | 0 refills | Status: DC

## 2017-03-19 NOTE — Telephone Encounter (Signed)
[facility] 

## 2017-03-23 ENCOUNTER — Other Ambulatory Visit: Payer: Self-pay | Admitting: Nurse Practitioner

## 2017-03-23 DIAGNOSIS — I480 Paroxysmal atrial fibrillation: Secondary | ICD-10-CM

## 2017-03-23 MED ORDER — DIVALPROEX SODIUM 250 MG PO DR TAB: DELAYED_RELEASE_TABLET | ORAL | 0 refills | Status: DC

## 2017-03-23 NOTE — Telephone Encounter (Signed)
[facility] 

## 2017-05-15 ENCOUNTER — Other Ambulatory Visit: Payer: Self-pay | Admitting: Nurse Practitioner

## 2017-05-23 IMAGING — CT CT ABD-PELV W/O CM
2 of 4 series · 15 of 46 positions shown, 17 images · non-contrast
Comparison: None.

CLINICAL DATA: Drop in hemoglobin/hematocrit. Recent aortogram and
peripheral vascular angioplasty.

EXAM:
CT ABDOMEN AND PELVIS WITHOUT CONTRAST
TECHNIQUE: Multidetector CT imaging of the abdomen and pelvis was performed
following the standard protocol without IV contrast.

[Series 5: a/p w/o 5mm · axial · non-contrast · 0.63mm/px · z∈[+924,+1274]mm · 12 of 84 slices shown, 14 images]
[im 7/84  soft-tissue]
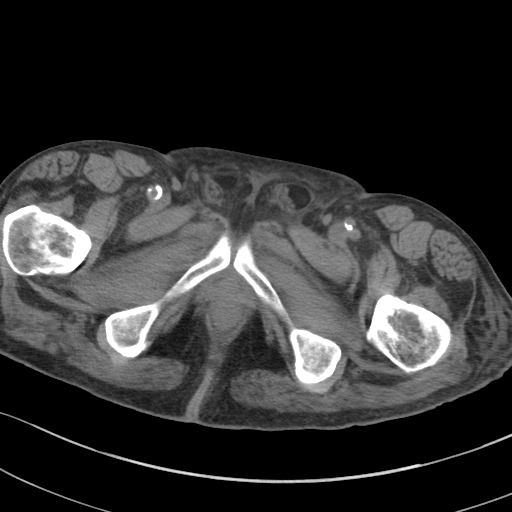
[im 7/84  bone]
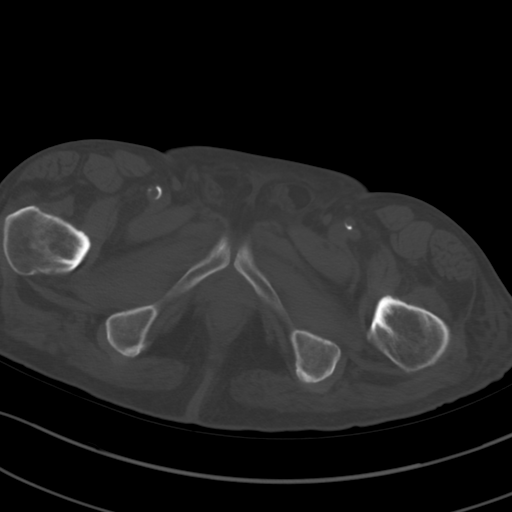
[im 13/84  soft-tissue]
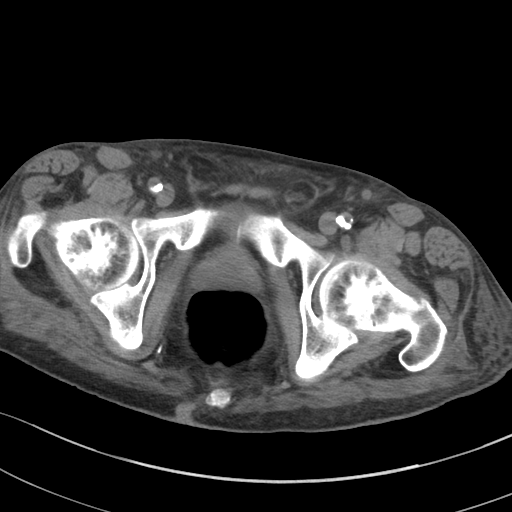
[im 20/84  soft-tissue]
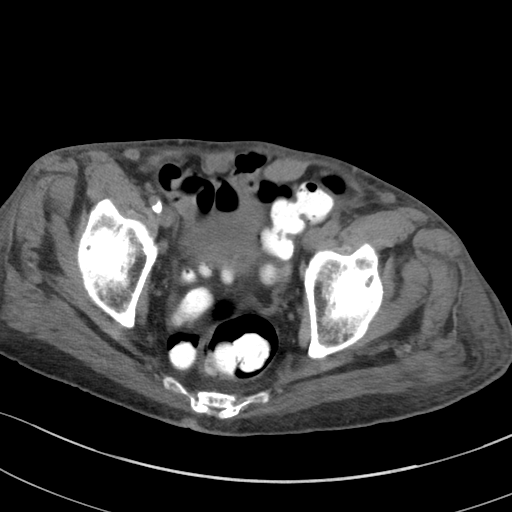
[im 26/84  soft-tissue]
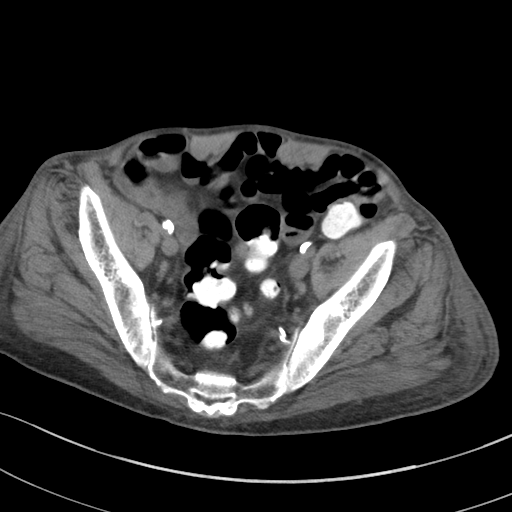
[im 32/84  soft-tissue]
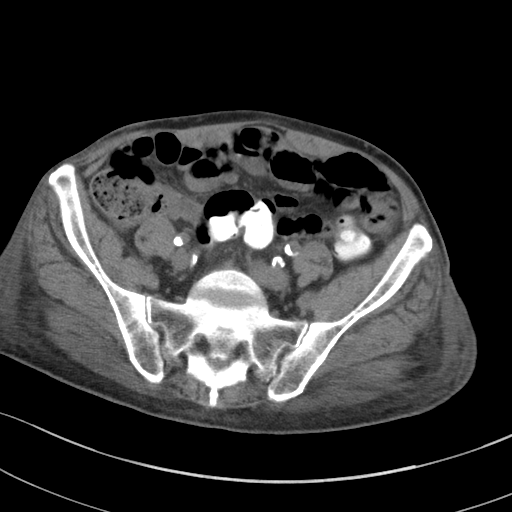
[im 39/84  soft-tissue]
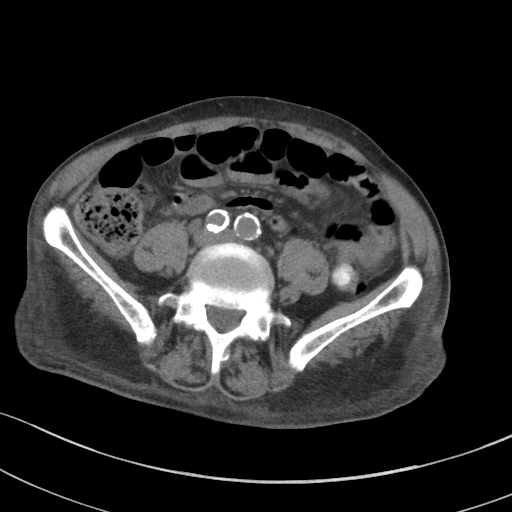
[im 45/84  soft-tissue]
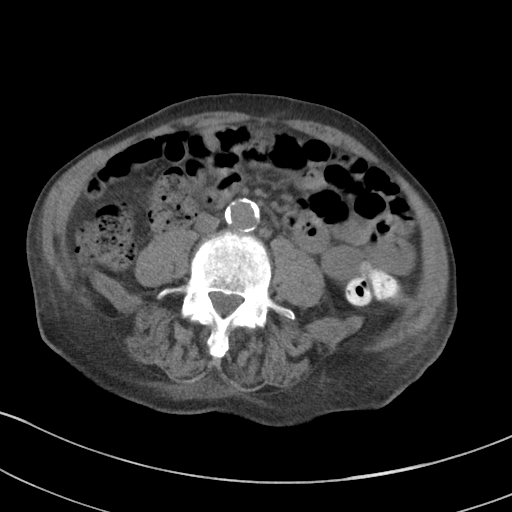
[im 52/84  soft-tissue]
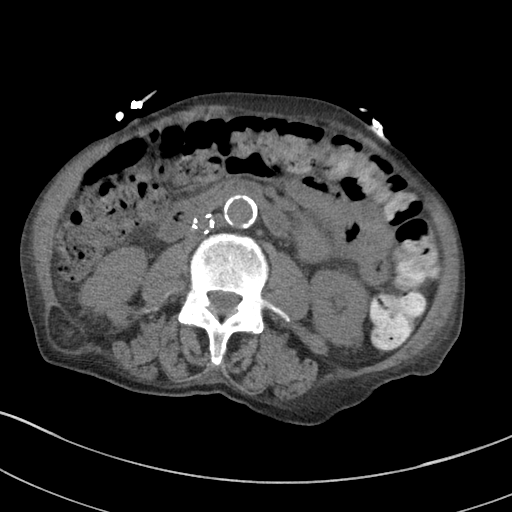
[im 58/84  soft-tissue]
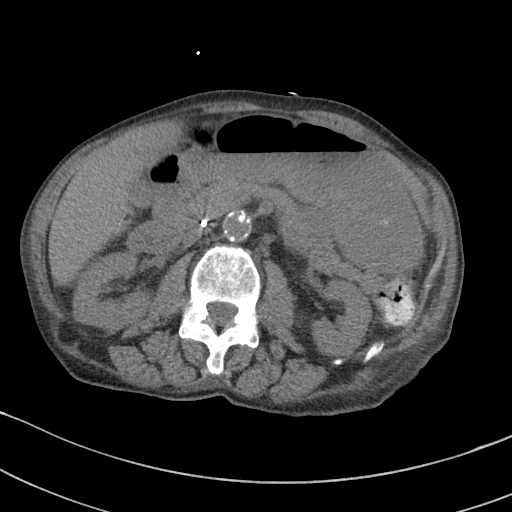
[im 58/84  bone]
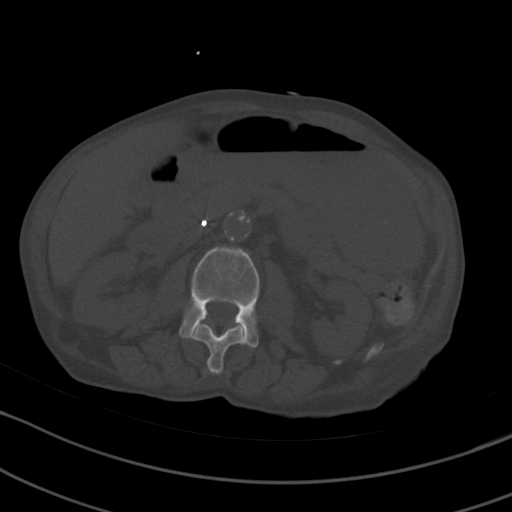
[im 64/84  soft-tissue]
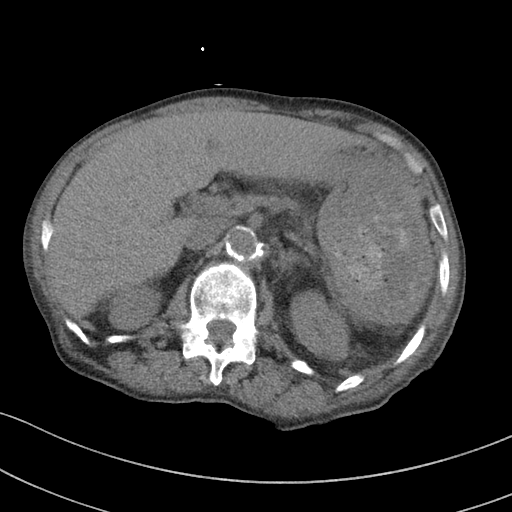
[im 71/84  soft-tissue]
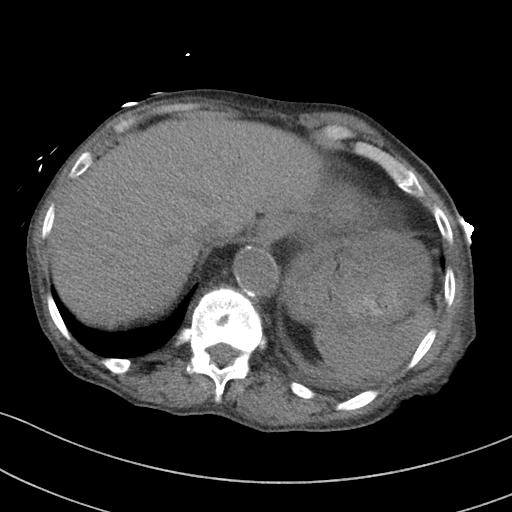
[im 77/84  soft-tissue]
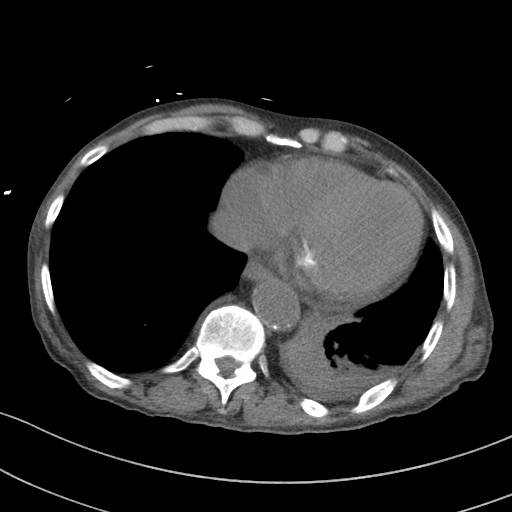

[Series 8: a/p w/o cor · coronal · non-contrast · 0.62mm/px · 3 of 151 slices shown]
[im 51/151  soft-tissue]
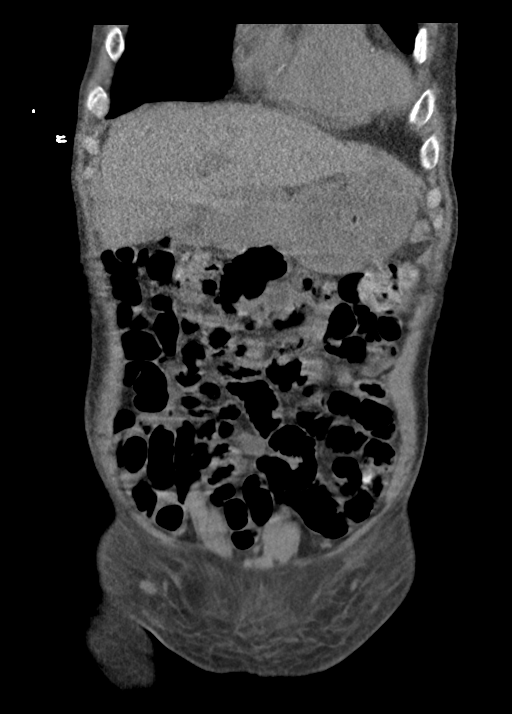
[im 67/151  soft-tissue]
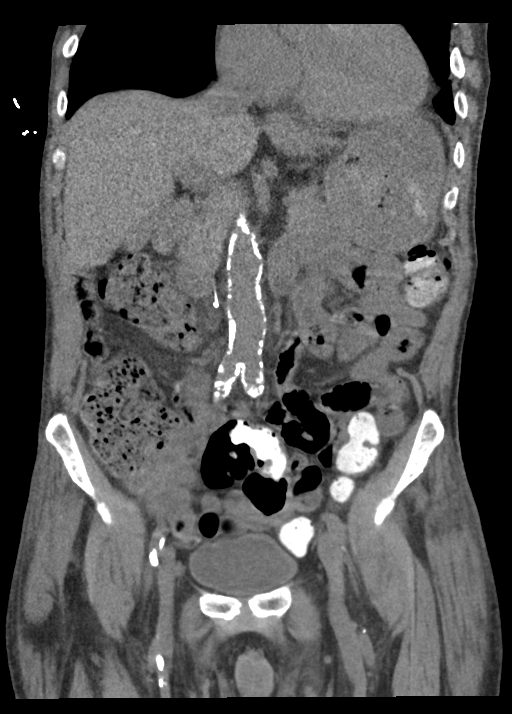
[im 84/151  soft-tissue]
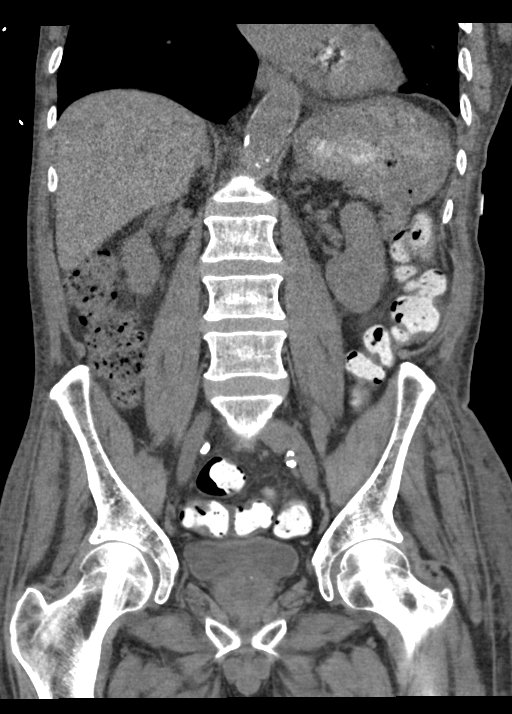

[15 of 46 positions shown; findings below may reference images not displayed]

FINDINGS: Lower chest: There is a small left pleural effusion hand left lower
lobe infiltrate and atelectasis. The right lung base is clear. No
pericardial effusion. Aortic calcifications are noted.

Hepatobiliary: No focal hepatic lesions or intrahepatic biliary
dilatation.

Pancreas: No mass, inflammation or ductal dilatation.

Spleen: Normal size.  No focal lesions.

Adrenals/Urinary Tract: The adrenal glands plan kidneys are grossly
normal. No renal, ureteral or bladder calculi or mass.

Stomach/Bowel: The stomach, duodenum, small bowel and colon are
grossly normal. No obvious inflammatory changes, mass lesions or
obstructive findings.

Vascular/Lymphatic: Severe atherosclerotic calcifications involving
the aorta and branch vessels. There is tortuosity and ectasia of the
no focal aneurysm. Similar findings involving the iliac arteries and
branch vessels in the pelvis. An IVC filter is noted.

No mesenteric or retroperitoneal mass or adenopathy. No hematoma is
identified.

Reproductive: The prostate gland and seminal vesicles are
unremarkable.

Other: No inguinal mass or adenopathy. Small left inguinal hernia
containing fat. There is also a right sided flank hernia containing
retroperitoneal/perirenal fat.

Subcutaneous fluid/ edema in the gluteal regions bilaterally, left
greater than right but no discrete hematoma.

Musculoskeletal: No significant bony findings.
IMPRESSION: No abdominal/ pelvic or groin hematoma to account for the patient's
drop in hemoglobin.

Extensive atherosclerotic calcifications involving the aorta and
branch vessels.

Left lower lobe process likely a combination of effusion,
atelectasis and infiltrate.

## 2017-06-01 ENCOUNTER — Other Ambulatory Visit: Payer: Self-pay | Admitting: Nurse Practitioner

## 2017-06-09 ENCOUNTER — Ambulatory Visit: Payer: Medicare Other | Admitting: Nurse Practitioner

## 2017-06-09 ENCOUNTER — Encounter: Payer: Self-pay | Admitting: Nurse Practitioner

## 2017-06-09 ENCOUNTER — Ambulatory Visit (INDEPENDENT_AMBULATORY_CARE_PROVIDER_SITE_OTHER): Payer: Medicare Other

## 2017-06-09 VITALS — BP 185/78 | HR 91 | Temp 97.5°F | Ht 68.0 in | Wt 138.0 lb

## 2017-06-09 VITALS — BP 189/70 | HR 91 | Temp 97.5°F | Ht 68.0 in | Wt 138.0 lb

## 2017-06-09 DIAGNOSIS — I10 Essential (primary) hypertension: Secondary | ICD-10-CM

## 2017-06-09 DIAGNOSIS — Z0000 Encounter for general adult medical examination without abnormal findings: Secondary | ICD-10-CM

## 2017-06-09 DIAGNOSIS — I5032 Chronic diastolic (congestive) heart failure: Secondary | ICD-10-CM | POA: Diagnosis not present

## 2017-06-09 DIAGNOSIS — R569 Unspecified convulsions: Secondary | ICD-10-CM | POA: Diagnosis not present

## 2017-06-09 DIAGNOSIS — F0391 Unspecified dementia with behavioral disturbance: Secondary | ICD-10-CM | POA: Diagnosis not present

## 2017-06-09 DIAGNOSIS — N183 Chronic kidney disease, stage 3 unspecified: Secondary | ICD-10-CM

## 2017-06-09 DIAGNOSIS — J449 Chronic obstructive pulmonary disease, unspecified: Secondary | ICD-10-CM | POA: Diagnosis not present

## 2017-06-09 DIAGNOSIS — E782 Mixed hyperlipidemia: Secondary | ICD-10-CM

## 2017-06-09 LAB — COMPLETE METABOLIC PANEL WITH GFR
AG RATIO: 1.4 (calc) (ref 1.0–2.5)
ALT: 9 U/L (ref 9–46)
AST: 19 U/L (ref 10–35)
Albumin: 4 g/dL (ref 3.6–5.1)
Alkaline phosphatase (APISO): 71 U/L (ref 40–115)
BUN/Creatinine Ratio: 14 (calc) (ref 6–22)
BUN: 18 mg/dL (ref 7–25)
CALCIUM: 9.2 mg/dL (ref 8.6–10.3)
CO2: 30 mmol/L (ref 20–32)
Chloride: 106 mmol/L (ref 98–110)
Creat: 1.32 mg/dL — ABNORMAL HIGH (ref 0.70–1.18)
GFR, EST NON AFRICAN AMERICAN: 51 mL/min/{1.73_m2} — AB (ref >=60)
GFR, Est African American: 59 mL/min/{1.73_m2} — ABNORMAL LOW (ref >=60)
Globulin: 2.9 g/dL (calc) (ref 1.9–3.7)
Glucose, Bld: 81 mg/dL (ref 65–99)
POTASSIUM: 4.6 mmol/L (ref 3.5–5.3)
Sodium: 142 mmol/L (ref 135–146)
Total Bilirubin: 0.3 mg/dL (ref 0.2–1.2)
Total Protein: 6.9 g/dL (ref 6.1–8.1)

## 2017-06-09 LAB — CBC WITH DIFFERENTIAL/PLATELET
BASOS ABS: 59 {cells}/uL (ref 0–200)
Basophils Relative: 1 %
Eosinophils Absolute: 360 cells/uL (ref 15–500)
Eosinophils Relative: 6.1 %
HEMATOCRIT: 37 % — AB (ref 38.5–50.0)
Hemoglobin: 12.2 g/dL — ABNORMAL LOW (ref 13.2–17.1)
LYMPHS ABS: 3404 {cells}/uL (ref 850–3900)
MCH: 29.4 pg (ref 27.0–33.0)
MCHC: 33 g/dL (ref 32.0–36.0)
MCV: 89.2 fL (ref 80.0–100.0)
MPV: 11.3 fL (ref 7.5–12.5)
Monocytes Relative: 11.1 %
NEUTROS PCT: 24.1 %
Neutro Abs: 1422 cells/uL — ABNORMAL LOW (ref 1500–7800)
PLATELETS: 170 10*3/uL (ref 140–400)
RBC: 4.15 10*6/uL — ABNORMAL LOW (ref 4.20–5.80)
RDW: 13.6 % (ref 11.0–15.0)
TOTAL LYMPHOCYTE: 57.7 %
WBC: 5.9 10*3/uL (ref 3.8–10.8)
WBCMIX: 655 {cells}/uL (ref 200–950)

## 2017-06-09 LAB — LIPID PANEL
Cholesterol: 219 mg/dL — ABNORMAL HIGH (ref <200)
HDL: 69 mg/dL (ref >40)
LDL CHOLESTEROL (CALC): 135 mg/dL — AB
Non-HDL Cholesterol (Calc): 150 mg/dL (calc) — ABNORMAL HIGH (ref <130)
TRIGLYCERIDES: 63 mg/dL (ref <150)
Total CHOL/HDL Ratio: 3.2 (calc) (ref <5.0)

## 2017-06-09 MED ORDER — HYDRALAZINE HCL 25 MG PO TABS: 25.0000 mg | ORAL_TABLET | Freq: Two times a day (BID) | ORAL | 1 refills | Status: DC

## 2017-06-09 MED ORDER — ZOSTER VAC RECOMB ADJUVANTED 50 MCG/0.5ML IM SUSR: 0.5000 mL | Freq: Once | INTRAMUSCULAR | 1 refills | Status: DC

## 2017-06-09 NOTE — Patient Instructions (Signed)
[name] , Thank you for taking time to come for your Medicare Wellness Visit. I appreciate your ongoing commitment to your health goals. Please review the following plan we discussed and let me know if I can assist you in the future.   Screening recommendations/referrals: Colonoscopy excluded, you are over age 79 Recommended yearly ophthalmology/optometry visit for glaucoma screening and checkup Recommended yearly dental visit for hygiene and checkup  Vaccinations: Influenza vaccine up to date, due 2019 fall season Pneumococcal vaccine up to date Tdap vaccine up to date, due 04/07/2018 Shingles vaccine due, prescription sent to pharmacy    Advanced directives: Advance directive discussed with you today. I have provided a copy for you to complete at home and have notarized. Once this is complete please bring a copy in to our office so we can scan it into your chart.  Conditions/risks identified: none  Next appointment: Tyson Dense, RN 06/11/2018 @ 8:30am  Preventive Care 69 Years and Older, Male Preventive care refers to lifestyle choices and visits with your health care provider that can promote health and wellness. What does preventive care include?  A yearly physical exam. This is also called an annual well check.  Dental exams once or twice a year.  Routine eye exams. Ask your health care provider how often you should have your eyes checked.  Personal lifestyle choices, including:  Daily care of your teeth and gums.  Regular physical activity.  Eating a healthy diet.  Avoiding tobacco and drug use.  Limiting alcohol use.  Practicing safe sex.  Taking low doses of aspirin every day.  Taking vitamin and mineral supplements as recommended by your health care provider. What happens during an annual well check? The services and screenings done by your health care provider during your annual well check will depend on your age, overall health, lifestyle risk factors, and  family history of disease. Counseling  Your health care provider may ask you questions about your:  Alcohol use.  Tobacco use.  Drug use.  Emotional well-being.  Home and relationship well-being.  Sexual activity.  Eating habits.  History of falls.  Memory and ability to understand (cognition).  Work and work Statistician. Screening  You may have the following tests or measurements:  Height, weight, and BMI.  Blood pressure.  Lipid and cholesterol levels. These may be checked every 5 years, or more frequently if you are over 64 years old.  Skin check.  Lung cancer screening. You may have this screening every year starting at age 31 if you have a 30-pack-year history of smoking and currently smoke or have quit within the past 15 years.  Fecal occult blood test (FOBT) of the stool. You may have this test every year starting at age 33.  Flexible sigmoidoscopy or colonoscopy. You may have a sigmoidoscopy every 5 years or a colonoscopy every 10 years starting at age 88.  Prostate cancer screening. Recommendations will vary depending on your family history and other risks.  Hepatitis C blood test.  Hepatitis B blood test.  Sexually transmitted disease (STD) testing.  Diabetes screening. This is done by checking your blood sugar (glucose) after you have not eaten for a while (fasting). You may have this done every 1-3 years.  Abdominal aortic aneurysm (AAA) screening. You may need this if you are a current or former smoker.  Osteoporosis. You may be screened starting at age 29 if you are at high risk. Talk with your health care provider about your test results, treatment  options, and if necessary, the need for more tests. Vaccines  Your health care provider may recommend certain vaccines, such as:  Influenza vaccine. This is recommended every year.  Tetanus, diphtheria, and acellular pertussis (Tdap, Td) vaccine. You may need a Td booster every 10 years.  Zoster  vaccine. You may need this after age 70.  Pneumococcal 13-valent conjugate (PCV13) vaccine. One dose is recommended after age 53.  Pneumococcal polysaccharide (PPSV23) vaccine. One dose is recommended after age 34. Talk to your health care provider about which screenings and vaccines you need and how often you need them. This information is not intended to replace advice given to you by your health care provider. Make sure you discuss any questions you have with your health care provider. Document Released: 04/20/2015 Document Revised: 12/12/2015 Document Reviewed: 01/23/2015 Elsevier Interactive Patient Education  2017 [employer] Prevention in the Home Falls can cause injuries. They can happen to people of all ages. There are many things you can do to make your home safe and to help prevent falls. What can I do on the outside of my home?  Regularly fix the edges of walkways and driveways and fix any cracks.  Remove anything that might make you trip as you walk through a door, such as a raised step or threshold.  Trim any bushes or trees on the path to your home.  Use bright outdoor lighting.  Clear any walking paths of anything that might make someone trip, such as rocks or tools.  Regularly check to see if handrails are loose or broken. Make sure that both sides of any steps have handrails.  Any raised decks and porches should have guardrails on the edges.  Have any leaves, snow, or ice cleared regularly.  Use sand or salt on walking paths during winter.  Clean up any spills in your garage right away. This includes oil or grease spills. What can I do in the bathroom?  Use night lights.  Install grab bars by the toilet and in the tub and shower. Do not use towel bars as grab bars.  Use non-skid mats or decals in the tub or shower.  If you need to sit down in the shower, use a plastic, non-slip stool.  Keep the floor dry. Clean up any water that spills on the  floor as soon as it happens.  Remove soap buildup in the tub or shower regularly.  Attach bath mats securely with double-sided non-slip rug tape.  Do not have throw rugs and other things on the floor that can make you trip. What can I do in the bedroom?  Use night lights.  Make sure that you have a light by your bed that is easy to reach.  Do not use any sheets or blankets that are too big for your bed. They should not hang down onto the floor.  Have a firm chair that has side arms. You can use this for support while you get dressed.  Do not have throw rugs and other things on the floor that can make you trip. What can I do in the kitchen?  Clean up any spills right away.  Avoid walking on wet floors.  Keep items that you use a lot in easy-to-reach places.  If you need to reach something above you, use a strong step stool that has a grab bar.  Keep electrical cords out of the way.  Do not use floor polish or wax that makes floors slippery.  If you must use wax, use non-skid floor wax.  Do not have throw rugs and other things on the floor that can make you trip. What can I do with my stairs?  Do not leave any items on the stairs.  Make sure that there are handrails on both sides of the stairs and use them. Fix handrails that are broken or loose. Make sure that handrails are as long as the stairways.  Check any carpeting to make sure that it is firmly attached to the stairs. Fix any carpet that is loose or worn.  Avoid having throw rugs at the top or bottom of the stairs. If you do have throw rugs, attach them to the floor with carpet tape.  Make sure that you have a light switch at the top of the stairs and the bottom of the stairs. If you do not have them, ask someone to add them for you. What else can I do to help prevent falls?  Wear shoes that:  Do not have high heels.  Have rubber bottoms.  Are comfortable and fit you well.  Are closed at the toe. Do not wear  sandals.  If you use a stepladder:  Make sure that it is fully opened. Do not climb a closed stepladder.  Make sure that both sides of the stepladder are locked into place.  Ask someone to hold it for you, if possible.  Clearly mark and make sure that you can see:  Any grab bars or handrails.  First and last steps.  Where the edge of each step is.  Use tools that help you move around (mobility aids) if they are needed. These include:  Canes.  Walkers.  Scooters.  Crutches.  Turn on the lights when you go into a dark area. Replace any light bulbs as soon as they burn out.  Set up your furniture so you have a clear path. Avoid moving your furniture around.  If any of your floors are uneven, fix them.  If there are any pets around you, be aware of where they are.  Review your medicines with your doctor. Some medicines can make you feel dizzy. This can increase your chance of falling. Ask your doctor what other things that you can do to help prevent falls. This information is not intended to replace advice given to you by your health care provider. Make sure you discuss any questions you have with your health care provider. Document Released: 01/18/2009 Document Revised: 08/30/2015 Document Reviewed: 04/28/2014 Elsevier Interactive Patient Education  2017 [employer].

## 2017-06-09 NOTE — Patient Instructions (Signed)
Start hydralazine 25 mg by mouth twice daily for blood pressure  Follow up in 2 weeks for blood pressure check.

## 2017-06-09 NOTE — Progress Notes (Signed)
Subjective:   [name] is a 77 y.o. male who presents for Medicare Annual/Subsequent preventive examination. Last AWV-04/12/2015       Objective:    Vitals: BP (!) 185/78 (BP Location: Left Arm, Patient Position: Sitting)   Pulse 91   Temp (!) 97.5 F (36.4 C) (Oral)   Ht 5\' 8"  (1.727 m)   Wt 138 lb (62.6 kg)   SpO2 98%   BMI 20.98 kg/m   Body mass index is 20.98 kg/m.  Provider notified of BP  Advanced Directives 06/09/2017 03/10/2017 02/02/2017 10/15/2016 09/25/2016 09/23/2016 09/18/2016  Does Patient Have a Medical Advance Directive? Yes No No No Yes Yes Yes  Type of Paramedic of [place];Living will - - - Special educational needs teacher of [place];Living will [place]  Does patient want to make changes to medical advance directive? No - Patient declined - - - - - No - Patient declined  Copy of [place] in Chart? No - copy requested - - - No - copy requested No - copy requested No - copy requested  Would patient like information on creating a medical advance directive? - - - - - - -    Tobacco Social History   Tobacco Use  Smoking Status Former Smoker  . Packs/day: 0.25  . Years: 56.00  . Pack years: 14.00  . Types: Cigarettes  Smokeless Tobacco Never Used  Tobacco Comment   quit smoking 2 months ago     Counseling given: Not Answered Comment: quit smoking 2 months ago   Clinical Intake:  Pre-visit preparation completed: No  Pain : No/denies pain     Nutritional Risks: None Diabetes: No     Interpreter Needed?: No  Information entered by :: Tyson Dense, RN  Past Medical History:  Diagnosis Date  . Alcohol abuse   . Anemia   . Aneurysm ([place])   . CKD (chronic kidney disease), stage III ([place])   . COPD (chronic obstructive pulmonary disease) ([place])   . Dysrhythmia   . Fever of unknown origin 08/2016  . History of atrial fibrillation   . Hyperchloremia   .  Hypercholesteremia   . Hyperpotassemia   . Hypertension   . Hypertensive renal disease, benign   . Leg edema   . PAD (peripheral artery disease) ([place])   . Pneumonia   . Pressure ulcer of left heel   . Seizures ([place])    has not had a seizure in 15 yrs  . Tobacco use   . Weight loss    Past Surgical History:  Procedure Laterality Date  . ABDOMINAL AORTOGRAM W/LOWER EXTREMITY N/A 08/13/2016   Procedure: Abdominal Aortogram w/Lower Extremity;  Surgeon: Wellington Hampshire, MD;  Location: [facility];  Service: Cardiovascular;  Laterality: N/A;  . 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, 1996  . EYE SURGERY     December 2017  . PERIPHERAL VASCULAR BALLOON ANGIOPLASTY  08/13/2016   Procedure: Peripheral Vascular Balloon Angioplasty;  Surgeon: Wellington Hampshire, MD;  Location: [facility];  Service: Cardiovascular;;  Aborted   Family History  Problem Relation Age of Onset  . Cancer Mother   . Congestive Heart Failure Father   . Alzheimer's disease Sister   . Congestive Heart Failure Sister   . Clotting disorder Sister   . Congestive Heart Failure Brother   . Clotting disorder Sister    Social History   Socioeconomic History  .  Marital status: Single    Spouse name: None  . Number of children: None  . Years of education: None  . Highest education level: None  Social Needs  . Financial resource strain: Not hard at all  . Food insecurity - worry: Never true  . Food insecurity - inability: Never true  . Transportation needs - medical: No  . Transportation needs - non-medical: No  Occupational History  . None  Tobacco Use  . Smoking status: Former Smoker    Packs/day: 0.25    Years: 56.00    Pack years: 14.00    Types: Cigarettes  . Smokeless tobacco: Never Used  . Tobacco comment: quit smoking 2 months ago  Substance and Sexual Activity  . Alcohol use: No    Alcohol/week: 0.0 oz    Comment: QUIT DRINKING  04/2016  . Drug use: No  . Sexual activity: Not Currently    Other Topics Concern  . None  Social History Narrative  . None    Outpatient Encounter Medications as of 06/09/2017  Medication Sig  . aspirin EC 81 MG tablet Take 1 tablet (81 mg total) by mouth daily.  Marland Kitchen atorvastatin (LIPITOR) 10 MG tablet TAKE 1 TABLET BY MOUTH EVERY DAY  . carvedilol (COREG) 3.125 MG tablet TAKE 1 TABLET (3.125 MG TOTAL) BY MOUTH 2 (TWO) TIMES DAILY WITH A MEAL.  Marland Kitchen Dextromethorphan-Menthol (DELSYM COUGH RELIEF MT) Use as directed 10 mLs in the mouth or throat daily as needed.  . divalproex (DEPAKOTE) 250 MG DR tablet Take one tablet by mouth three times daily for seizures  . levETIRAcetam (KEPPRA) 500 MG tablet TAKE 1 TABLET BY MOUTH TWICE A DAY  . memantine (NAMENDA) 10 MG tablet TAKE 1 TABLET BY MOUTH TWICE A DAY  . Multiple Vitamins-Minerals (THEREMS-M) TABS Take 1 tablet by mouth daily.  Marland Kitchen Zoster Vaccine Adjuvanted [facility]) injection Inject 0.5 mLs into the muscle once for 1 dose.  . [DISCONTINUED] Zoster Vaccine Adjuvanted [facility]) injection Inject 0.5 mLs into the muscle once.  . thiamine ([facility]S B-1) 100 MG tablet Take 1 tablet (100 mg total) by mouth daily.  . [DISCONTINUED] carvedilol (COREG) 6.25 MG tablet TAKE 1 TABLET (6.25 MG TOTAL) BY MOUTH 2 (TWO) TIMES DAILY WITH A MEAL. (Patient not taking: Reported on 06/09/2017)   No facility-administered encounter medications on file as of 06/09/2017.     Activities of Daily Living In your present state of health, do you have any difficulty performing the following activities: 06/09/2017 09/18/2016  Hearing? N N  Vision? Y N  Difficulty concentrating or making decisions? Y Y  Comment - occas STM loss pt does become agitated at times   Walking or climbing stairs? N Y  Dressing or bathing? N Y  Doing errands, shopping? Y -  Conservation officer, nature and eating ? N -  Using the Toilet? N -  In the past six months, have you accidently leaked urine? N -  Do you have problems with loss of bowel control? N -  Managing your  Medications? Y -  Managing your Finances? Y -  Housekeeping or managing your Housekeeping? Y -  Some recent data might be hidden    Patient Care Team: [name], NP as PCP - General (Nurse Practitioner)   Assessment:   This is a routine wellness examination for [name].  Exercise Activities and Dietary recommendations Current Exercise Habits: The patient does not participate in regular exercise at present, Exercise limited by: None identified  Goals  None      Fall Risk Fall Risk  06/09/2017 03/10/2017 02/02/2017 10/15/2016 09/25/2016  Falls in the past year? No No No No No  Number falls in past yr: - - - - -  Comment - - - - -  Injury with Fall? - - - - -  Risk for fall due to : - - - - -   Is the patient's home free of loose throw rugs in walkways, pet beds, electrical cords, etc?   yes      Grab bars in the bathroom? no      Handrails on the stairs?   yes      Adequate lighting?   yes   Depression Screen PHQ 2/9 Scores 06/09/2017 12/13/2015 11/14/2014 07/13/2014  PHQ - 2 Score 0 0 0 0    Cognitive Function: MMSE within last year MMSE - Mini Mental State Exam 09/25/2016 04/12/2015 03/07/2014  Not completed: - (No Data) -  Orientation to time 2 5 4   Orientation to Place 3 5 5   Registration 3 3 3   Attention/ Calculation 0 4 3  Recall 1 1 3   Language- name 2 objects 2 2 2   Language- repeat 1 1 1   Language- follow 3 step command 3 3 3   Language- read & follow direction 1 1 1   Write a sentence 0 1 1  Copy design 1 0 1  Total score 17 26 27         Immunization History  Administered Date(s) Administered  . Influenza, High Dose Seasonal PF 11/26/2016  . Influenza,inj,Quad PF,6+ Mos 01/19/2014, 04/12/2015, 12/13/2015  . Pneumococcal Conjugate-13 04/12/2015  . Pneumococcal Polysaccharide-23 02/02/2013  . Td 04/07/2008    Qualifies for Shingles Vaccine? Yes, educated and prescription sent to pharmacy  Screening Tests Health Maintenance  Topic Date Due  .  TETANUS/TDAP  04/07/2018  . INFLUENZA VACCINE  Completed  . PNA vac Low Risk Adult  Completed   Cancer Screenings: Lung: Low Dose CT Chest recommended if Age 73-80 years, 30 pack-year currently smoking OR have quit w/in 15years. Patient does not qualify. Colorectal: up to date  Additional Screenings:  Hepatitis C Screening: declined    Plan:    I have personally reviewed and addressed the Medicare Annual Wellness questionnaire and have noted the following in the patient's chart:  A. Medical and social history B. Use of alcohol, tobacco or illicit drugs  C. Current medications and supplements D. Functional ability and status E.  Nutritional status F.  Physical activity G. Advance directives H. List of other physicians I.  Hospitalizations, surgeries, and ER visits in previous 12 months J.  [place] to include hearing, vision, cognitive, depression L. Referrals and appointments - none  In addition, I have reviewed and discussed with patient certain preventive protocols, quality metrics, and best practice recommendations. A written personalized care plan for preventive services as well as general preventive health recommendations were provided to patient.  See attached scanned questionnaire for additional information.   Signed,   Tyson Dense, RN Nurse Health Advisor  Patient Concerns: None

## 2017-06-09 NOTE — Progress Notes (Signed)
Careteam: Patient Care Team: [name], NP as PCP - General (Nurse Practitioner)  Advanced Directive information    Allergies  Allergen Reactions  . Diltiazem Rash and Other (See Comments)    Blisters  . Penicillins Other (See Comments)    Unknown Childhood allergy  Has patient had a PCN reaction causing immediate rash, facial/tongue/throat swelling, SOB or lightheadedness with hypotension: unknown Has patient had a PCN reaction causing severe rash involving mucus membranes or skin necrosis: unknown Has patient had a PCN reaction that required hospitalization unknown Has patient had a PCN reaction occurring within the last 10 years:no If all of the above answers are "NO", then may proceed with Cephalosporin use.     Chief Complaint  Patient presents with  . Medical Management of Chronic Issues    Pt is being seen for a 3 month routine visit.   . Other    Pt cousin, Hollace Hayward, in room     HPI: Patient is a 77 y.o. male seen in the office today for routine follow up.  Htn/CHF/PAD- elevated today, they do not take blood pressure at home.  Somehow coreg was increased to 6.25 but caregiver has continued to give 3.125mg  twice daily. No shortness of breath noted. No edema Following with cardiologist Cont on low dose ASA  Dementia- short term memory continues to decline but overall functioning has been stable. Agreeable to help. Eating what he wants. Weight actually up- does eat some junk but overall caregiver trying to make sure he is getting food with good nutritional value.   ETOH abuse- not on supplement, has not been drinking alcohol   No skin issues. Without complaints. Caregivers does not have any concerns today.   Review of Systems:  Review of Systems  Unable to perform ROS: Dementia    Past Medical History:  Diagnosis Date  . Alcohol abuse   . Anemia   . Aneurysm ([place])   . CKD (chronic kidney disease), stage III ([place])   . COPD (chronic obstructive  pulmonary disease) ([place])   . Dysrhythmia   . Fever of unknown origin 08/2016  . History of atrial fibrillation   . Hyperchloremia   . Hypercholesteremia   . Hyperpotassemia   . Hypertension   . Hypertensive renal disease, benign   . Leg edema   . PAD (peripheral artery disease) ([place])   . Pneumonia   . Pressure ulcer of left heel   . Seizures ([place])    has not had a seizure in 15 yrs  . Tobacco use   . Weight loss    Past Surgical History:  Procedure Laterality Date  . ABDOMINAL AORTOGRAM W/LOWER EXTREMITY N/A 08/13/2016   Procedure: Abdominal Aortogram w/Lower Extremity;  Surgeon: Wellington Hampshire, MD;  Location: [facility];  Service: Cardiovascular;  Laterality: N/A;  . 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, 1996  . EYE SURGERY     December 2017  . PERIPHERAL VASCULAR BALLOON ANGIOPLASTY  08/13/2016   Procedure: Peripheral Vascular Balloon Angioplasty;  Surgeon: Wellington Hampshire, MD;  Location: [facility];  Service: Cardiovascular;;  Aborted   Social History:   reports that he has quit smoking. His smoking use included cigarettes. He has a 14.00 pack-year smoking history. he has never used smokeless tobacco. He reports that he does not drink alcohol or use drugs.  Family History  Problem Relation Age of Onset  . Cancer Mother   . Congestive Heart Failure Father   .  Alzheimer's disease Sister   . Congestive Heart Failure Sister   . Clotting disorder Sister   . Congestive Heart Failure Brother   . Clotting disorder Sister     Medications: Patient's Medications  New Prescriptions   No medications on file  Previous Medications   ASPIRIN EC 81 MG TABLET    Take 1 tablet (81 mg total) by mouth daily.   ATORVASTATIN (LIPITOR) 10 MG TABLET    TAKE 1 TABLET BY MOUTH EVERY DAY   CARVEDILOL (COREG) 3.125 MG TABLET    TAKE 1 TABLET (3.125 MG TOTAL) BY MOUTH 2 (TWO) TIMES DAILY WITH A MEAL.   DEXTROMETHORPHAN-MENTHOL (DELSYM COUGH RELIEF MT)    Use as directed 10 mLs in  the mouth or throat daily as needed.   DIVALPROEX (DEPAKOTE) 250 MG DR TABLET    Take one tablet by mouth three times daily for seizures   LEVETIRACETAM (KEPPRA) 500 MG TABLET    TAKE 1 TABLET BY MOUTH TWICE A DAY   MEMANTINE (NAMENDA) 10 MG TABLET    TAKE 1 TABLET BY MOUTH TWICE A DAY   MULTIPLE VITAMINS-MINERALS (THEREMS-M) TABS    Take 1 tablet by mouth daily.   THIAMINE ([employer] B-1) 100 MG TABLET    Take 1 tablet (100 mg total) by mouth daily.  Modified Medications   No medications on file  Discontinued Medications   CARVEDILOL (COREG) 6.25 MG TABLET    TAKE 1 TABLET (6.25 MG TOTAL) BY MOUTH 2 (TWO) TIMES DAILY WITH A MEAL.   ZOSTER VACCINE ADJUVANTED (SHINGRIX) INJECTION    Inject 0.5 mLs into the muscle once for 1 dose.     Physical Exam:  Vitals:   06/09/17 0948  BP: (!) 189/70  Pulse: 91  Temp: (!) 97.5 F (36.4 C)  SpO2: 98%  Weight: 138 lb (62.6 kg)  Height: 5\' 8"  (1.727 m)   Body mass index is 20.98 kg/m.  Physical Exam  Constitutional: No distress.  Frail thin male NAD.   HENT:  Mouth/Throat: Oropharynx is clear and moist. No oropharyngeal exudate.  Cardiovascular: Normal rate and regular rhythm.  Pulmonary/Chest: Effort normal and breath sounds normal. No respiratory distress.  Abdominal: Soft. Bowel sounds are normal.  Musculoskeletal: Normal range of motion. He exhibits no edema or tenderness.  Neurological: He is alert.  Skin: Skin is warm. No rash noted.  Psychiatric: His affect is labile. Cognition and memory are impaired. He exhibits abnormal recent memory.     *  Labs reviewed: Basic Metabolic Panel: Recent Labs    08/19/16 1623  11/26/16 1050 02/02/17 1012 03/10/17 1018  NA  --    < > 140 139 145  K  --    < > 4.7 4.6 4.5  CL  --    < > 103 104 110  CO2  --    < > 24 28 30   GLUCOSE  --    < > 84 89 91  BUN  --    < > 34* 29* 28*  CREATININE  --    < > 1.38* 1.74* 1.48*  CALCIUM  --    < > 9.3 9.0 9.2  TSH 3.541  --   --   --   --    < >  = values in this interval not displayed.   Liver Function Tests: Recent Labs    06/19/16 1051 08/18/16 1348 08/21/16 1144 02/02/17 1012  AST 13 29 25 19   ALT 6* 22 20 15  ALKPHOS 64 55 56  --   BILITOT 0.3 0.5 0.5 0.3  PROT 6.7 6.8 6.6 7.0  ALBUMIN 3.3* 2.9* 2.3*  --    No results for input(s): LIPASE, AMYLASE in the last 8760 hours. No results for input(s): AMMONIA in the last 8760 hours. CBC: Recent Labs    09/23/16 1200 10/22/16 0940 11/21/16 1126  WBC 4.9 5.9 6.4  NEUTROABS 2.0 1.6 2.2  HGB 9.6* 11.1* 11.7*  HCT 29.7* 34.6* 35.8*  MCV 84.8 88.0 87.7  PLT 180 150 146   Lipid Panel: No results for input(s): CHOL, HDL, LDLCALC, TRIG, CHOLHDL, LDLDIRECT in the last 8760 hours. TSH: Recent Labs    08/19/16 1623  TSH 3.541   A1C: Lab Results  Component Value Date   HGBA1C 5.2 04/15/2016     Assessment/Plan 1. Essential hypertension, benign Not controlled, will start hydralazine 25 mg by mouth BID, low sodium diet encouraged.  - CBC with Differential/Platelets - hydrALAZINE (APRESOLINE) 25 MG tablet; Take 1 tablet (25 mg total) by mouth 2 (two) times daily.  Dispense: 60 tablet; Refill: 1  2. Chronic diastolic heart failure (HCC) Euvolemic, minimal PO intake therefore diuretic was stopped, no signs of fluid overload. Continues on coreg.  3. COPD, group B, by GOLD 2017 classification ([place]) Stable at this time. Not currently on routine inhalers  4. Dementia with behavioral disturbance, unspecified dementia type -progressive decline noted on MMSE however doing well functionally at home per caregiver. Sister has dementia and they are living together receiving care at home. Unable to tolerate aricept due to diarrhea and bradycardia. Continues on namenda.   5. CKD (chronic kidney disease), stage III ([place]) -encouraged proper hydration, to avoid NSAIDS - COMPLETE METABOLIC PANEL WITH GFR  6. Seizures (HCC) No recent seizures continues on keppra 500 mg BID and  depakote TID  7. Mixed hyperlipidemia -currently on lipitor - Lipid Panel  Next appt: 2 weeks for blood pressure follow up [place]. Harle Battiest  [facility] [phone] 8 am - 5 pm) [phone] (after hours)

## 2017-06-10 ENCOUNTER — Telehealth: Payer: Self-pay

## 2017-06-10 MED ORDER — ATORVASTATIN CALCIUM 20 MG PO TABS: 20.0000 mg | ORAL_TABLET | Freq: Every day | ORAL | 1 refills | Status: DC

## 2017-06-10 NOTE — Telephone Encounter (Signed)
-----   Message from [name], NP sent at 06/10/2017  9:03 AM EST ----- Kidney function stable, actually improvement over the last 4 months and now back to baseline from 6 months ago. Blood counts stable. Cholesterol is worse, probably due to diet. Let increase Lipitor to 20 mg daily

## 2017-06-10 NOTE — Telephone Encounter (Signed)
Discussed results with Hollace Hayward (relative), [place] verbalized understanding of results.  Patient will take 2 of Lipitor 10 until he runs out of current supply. New RX sent to local pharmacy fr increased dose

## 2017-06-21 IMAGING — CT CT BIOPSY
1 of 2 series · 15 of 31 positions shown, 19 images · non-contrast
Comparison: none

INDICATION: Anemia

[Series 2: i-spiral 5.0 b40f · axial · 0.74mm/px · z∈[+1009,+1090]mm · 15 of 27 slices shown, 19 images]
[im 2/27  mediastinal]
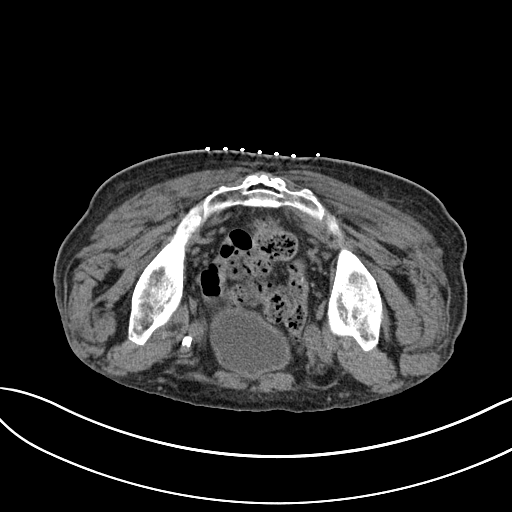
[im 2/27  lung]
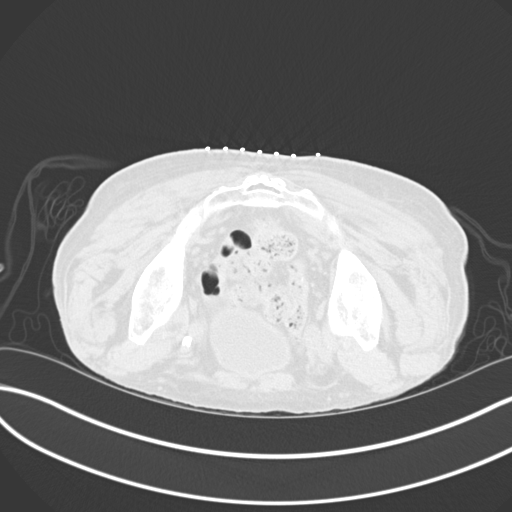
[im 4/27  lung]
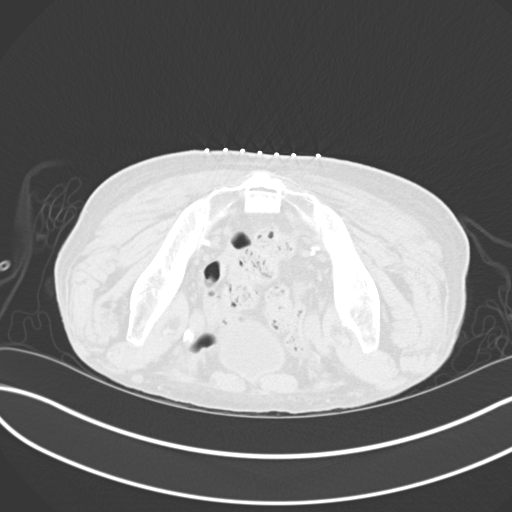
[im 7/27  lung]
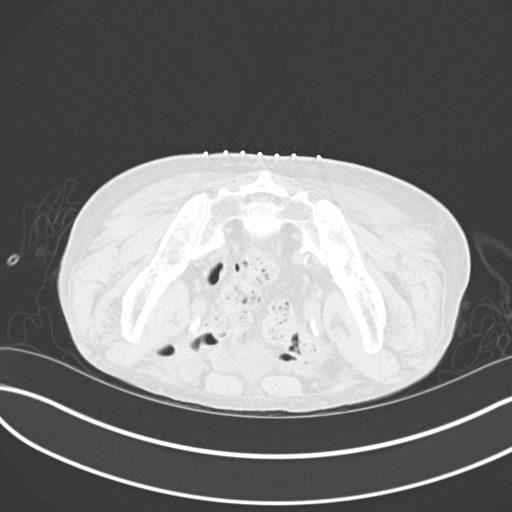
[im 8/27  lung]
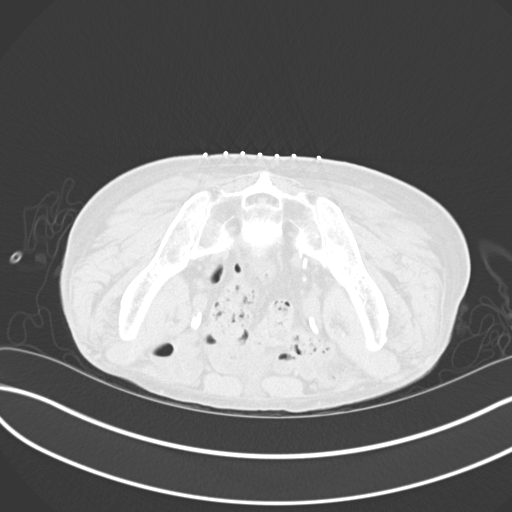
[im 9/27  mediastinal]
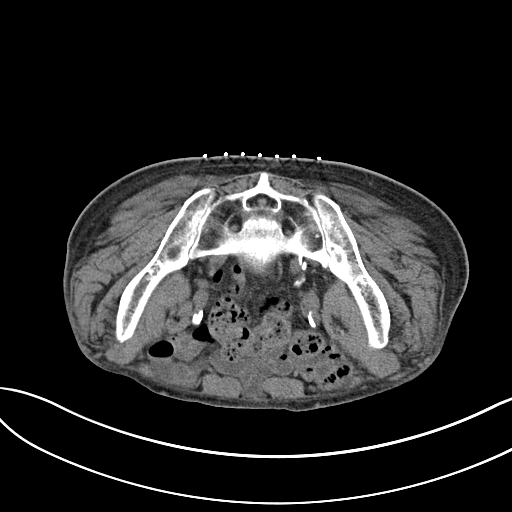
[im 9/27  lung]
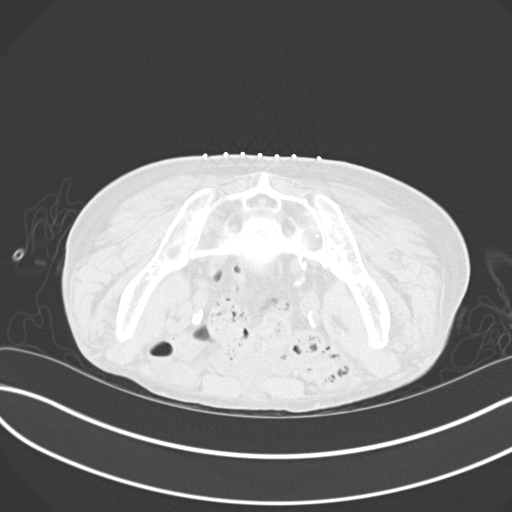
[im 10/27  lung]
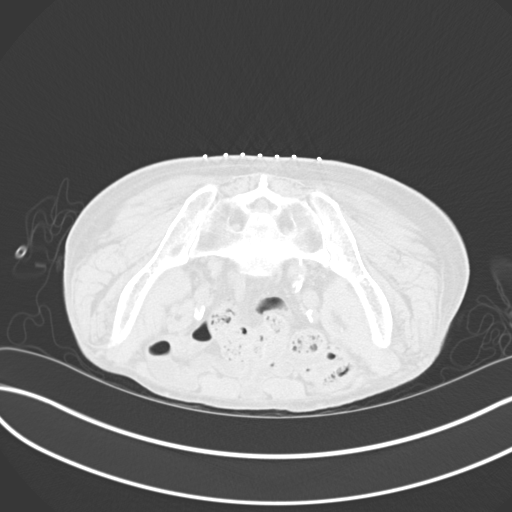
[im 12/27  lung]
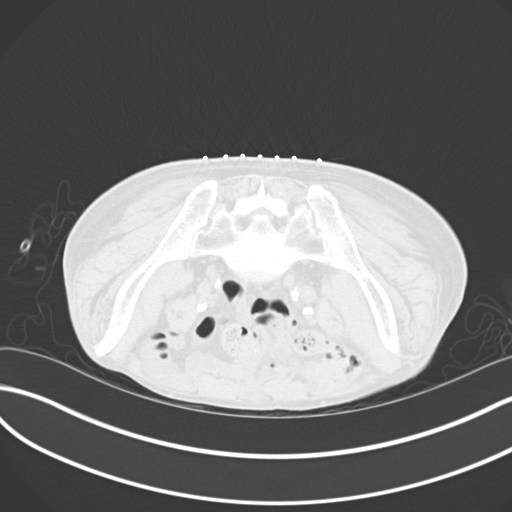
[im 13/27  lung]
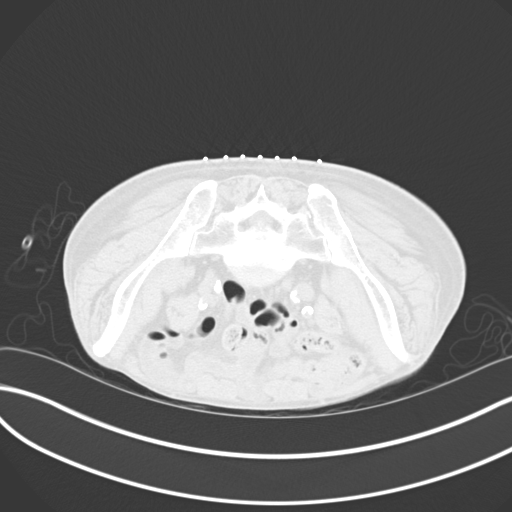
[im 14/27  mediastinal]
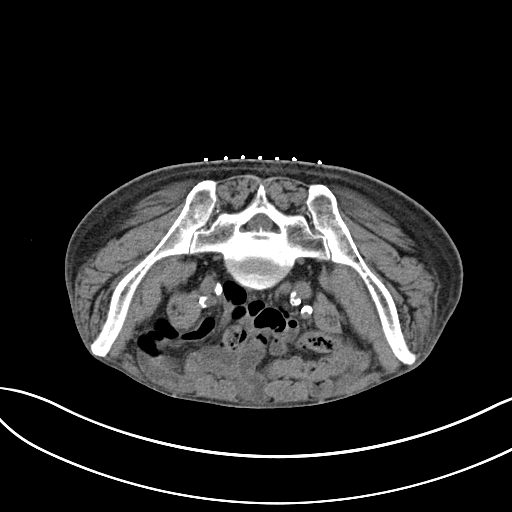
[im 14/27  lung]
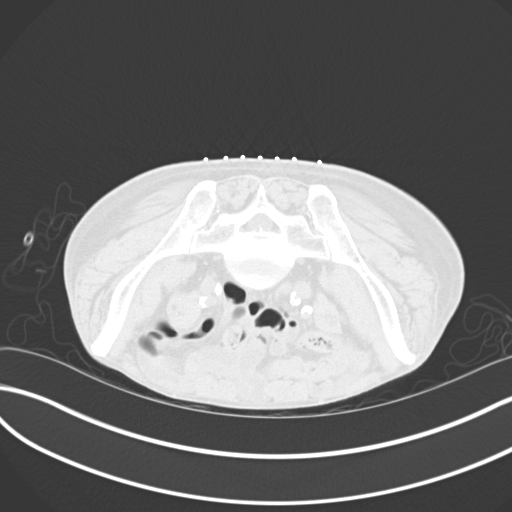
[im 17/27  lung]
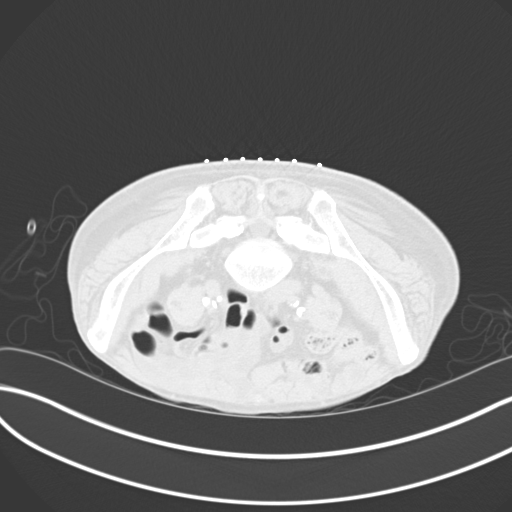
[im 18/27  lung]
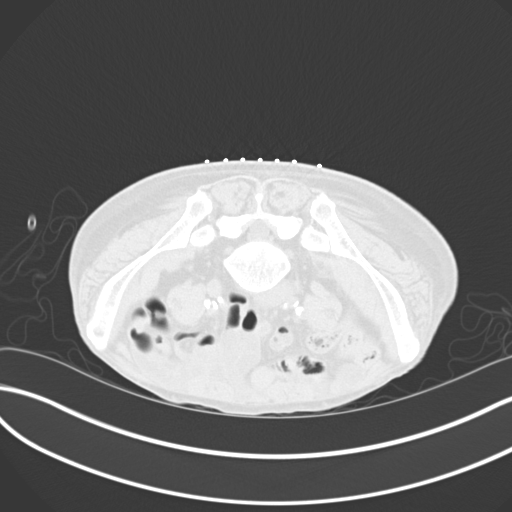
[im 19/27  lung]
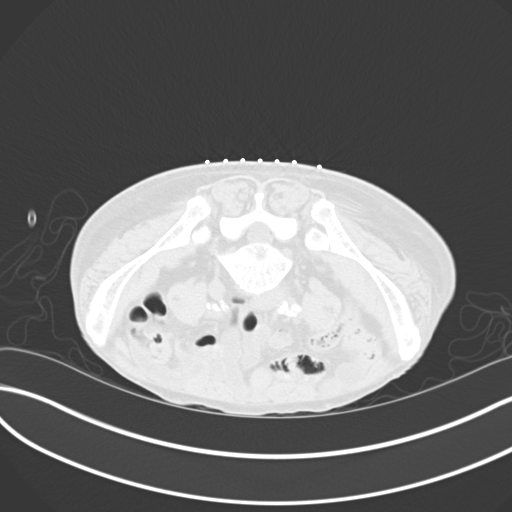
[im 20/27  mediastinal]
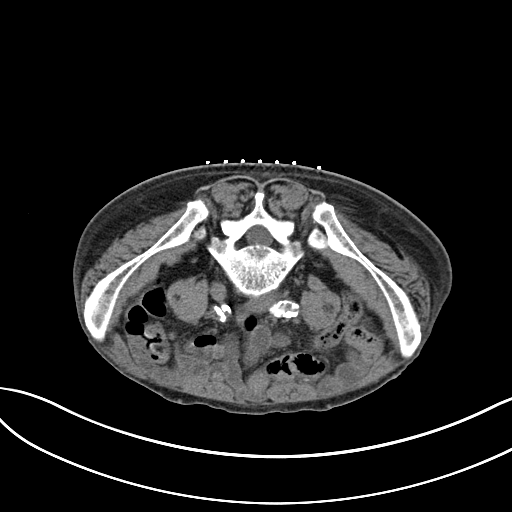
[im 20/27  lung]
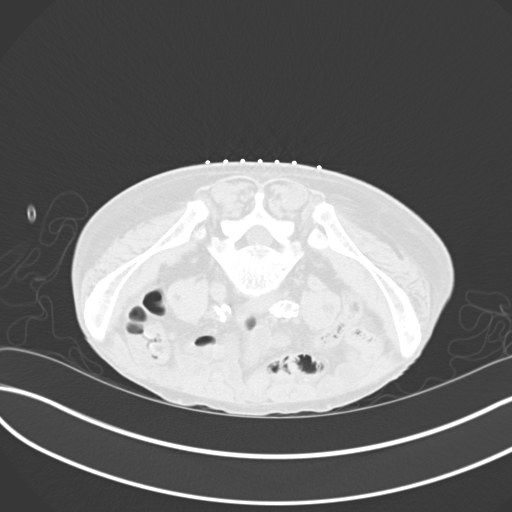
[im 23/27  lung]
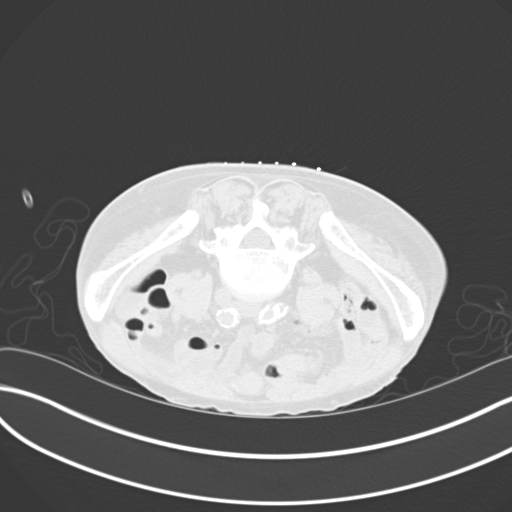
[im 25/27  lung]
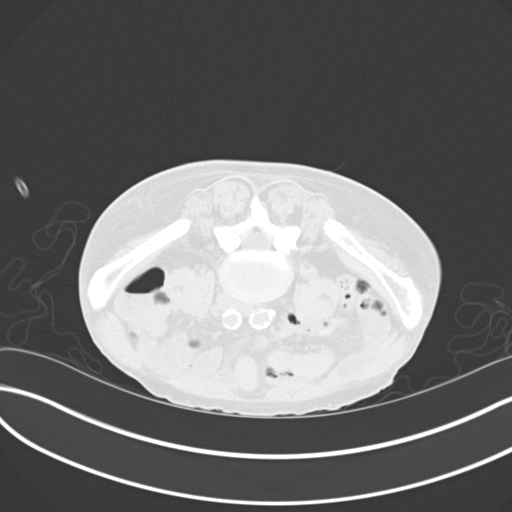

[15 of 31 positions shown; findings below may reference images not displayed]

EXAM:
CT BIOPSY; CT BONE MARROW BIOPSY AND ASPIRATION

MEDICATIONS:
None.

ANESTHESIA/SEDATION:
Fentanyl 100 mcg IV; Versed 2 mg IV

Moderate Sedation Time:  12

The patient was continuously monitored during the procedure by the
interventional radiology nurse under my direct supervision.

FLUOROSCOPY TIME:  None

COMPLICATIONS:
None immediate.

PROCEDURE:
Informed written consent was obtained from the patient after a
thorough discussion of the procedural risks, benefits and
alternatives. All questions were addressed. Maximal Sterile Barrier
Technique was utilized including caps, mask, sterile gowns, sterile
gloves, sterile drape, hand hygiene and skin antiseptic. A timeout
was performed prior to the initiation of the procedure.

Under CT guidance, a(n) 11 gauge guide needle was advanced into the
right iliac bone. Aspirates and a core were obtained. Post biopsy
images demonstrate no hemorrhage.

Patient tolerated the procedure well without complication. Vital
sign monitoring by nursing staff during the procedure will continue
as patient is in the special procedures unit for post procedure
observation.
FINDINGS: The images document guide needle placement within the right iliac
bone. Post biopsy images demonstrate no hemorrhage.
IMPRESSION: Successful CT-guided bone marrow aspirate and core.

## 2017-06-25 ENCOUNTER — Encounter: Payer: Self-pay | Admitting: Nurse Practitioner

## 2017-06-25 ENCOUNTER — Ambulatory Visit: Payer: Medicare Other | Admitting: Nurse Practitioner

## 2017-06-25 ENCOUNTER — Other Ambulatory Visit: Payer: Self-pay | Admitting: Nurse Practitioner

## 2017-06-25 VITALS — BP 160/78 | HR 70 | Temp 98.2°F | Ht 68.0 in | Wt 136.0 lb

## 2017-06-25 DIAGNOSIS — I10 Essential (primary) hypertension: Secondary | ICD-10-CM

## 2017-06-25 DIAGNOSIS — I480 Paroxysmal atrial fibrillation: Secondary | ICD-10-CM

## 2017-06-25 MED ORDER — HYDRALAZINE HCL 50 MG PO TABS: 50.0000 mg | ORAL_TABLET | Freq: Two times a day (BID) | ORAL | 1 refills | Status: DC

## 2017-06-25 MED ORDER — CARVEDILOL 3.125 MG PO TABS: 3.1250 mg | ORAL_TABLET | Freq: Two times a day (BID) | ORAL | 3 refills | Status: DC

## 2017-06-25 NOTE — Progress Notes (Signed)
Careteam: Patient Care Team: [name], NP as PCP - General (Nurse Practitioner)  Advanced Directive information    Allergies  Allergen Reactions  . Diltiazem Rash and Other (See Comments)    Blisters  . Penicillins Other (See Comments)    Unknown Childhood allergy  Has patient had a PCN reaction causing immediate rash, facial/tongue/throat swelling, SOB or lightheadedness with hypotension: unknown Has patient had a PCN reaction causing severe rash involving mucus membranes or skin necrosis: unknown Has patient had a PCN reaction that required hospitalization unknown Has patient had a PCN reaction occurring within the last 10 years:no If all of the above answers are "NO", then may proceed with Cephalosporin use.     Chief Complaint  Patient presents with  . Follow-up    Pt is being seen for a 2 week follow up on blood pressure.   Stann Ore, Hollace Hayward, is in room     HPI: Patient is a 77 y.o. male seen in the office today to follow up blood pressure, pt was started on hydralazine at last OV due to elevated BP. Currently taking hydralazine and coreg. No adverse effects from hydralazine Does not take blood pressure at home.  Does not follow low sodium diet due to dementia and weight loss hard to get him to eat but caregiver/cousin does attempts to get him to eat well balance nutritious diet.     Review of Systems:  Review of Systems  Unable to perform ROS: Dementia    Past Medical History:  Diagnosis Date  . Alcohol abuse   . Anemia   . Aneurysm ([place])   . CKD (chronic kidney disease), stage III ([place])   . COPD (chronic obstructive pulmonary disease) ([place])   . Dysrhythmia   . Fever of unknown origin 08/2016  . History of atrial fibrillation   . Hyperchloremia   . Hypercholesteremia   . Hyperpotassemia   . Hypertension   . Hypertensive renal disease, benign   . Leg edema   . PAD (peripheral artery disease) ([place])   . Pneumonia   . Pressure  ulcer of left heel   . Seizures ([place])    has not had a seizure in 15 yrs  . Tobacco use   . Weight loss    Past Surgical History:  Procedure Laterality Date  . ABDOMINAL AORTOGRAM W/LOWER EXTREMITY N/A 08/13/2016   Procedure: Abdominal Aortogram w/Lower Extremity;  Surgeon: Wellington Hampshire, MD;  Location: [facility];  Service: Cardiovascular;  Laterality: N/A;  . 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, 1996  . EYE SURGERY     December 2017  . PERIPHERAL VASCULAR BALLOON ANGIOPLASTY  08/13/2016   Procedure: Peripheral Vascular Balloon Angioplasty;  Surgeon: Wellington Hampshire, MD;  Location: [facility];  Service: Cardiovascular;;  Aborted   Social History:   reports that he has quit smoking. His smoking use included cigarettes. He has a 14.00 pack-year smoking history. He has never used smokeless tobacco. He reports that he does not drink alcohol or use drugs.  Family History  Problem Relation Age of Onset  . Cancer Mother   . Congestive Heart Failure Father   . Alzheimer's disease Sister   . Congestive Heart Failure Sister   . Clotting disorder Sister   . Congestive Heart Failure Brother   . Clotting disorder Sister     Medications: Patient's Medications  New Prescriptions   No medications on file  Previous Medications  ASPIRIN EC 81 MG TABLET    Take 1 tablet (81 mg total) by mouth daily.   ATORVASTATIN (LIPITOR) 20 MG TABLET    Take 1 tablet (20 mg total) by mouth daily.   CARVEDILOL (COREG) 3.125 MG TABLET    TAKE 1 TABLET (3.125 MG TOTAL) BY MOUTH 2 (TWO) TIMES DAILY WITH A MEAL.   DEXTROMETHORPHAN-MENTHOL (DELSYM COUGH RELIEF MT)    Use as directed 10 mLs in the mouth or throat daily as needed.   HYDRALAZINE (APRESOLINE) 25 MG TABLET    Take 1 tablet (25 mg total) by mouth 2 (two) times daily.   LEVETIRACETAM (KEPPRA) 500 MG TABLET    TAKE 1 TABLET BY MOUTH TWICE A DAY   MEMANTINE (NAMENDA) 10 MG TABLET    TAKE 1 TABLET BY MOUTH TWICE A DAY   MULTIPLE  VITAMINS-MINERALS (THEREMS-M) TABS    Take 1 tablet by mouth daily.   THIAMINE ([employer] B-1) 100 MG TABLET    Take 1 tablet (100 mg total) by mouth daily.  Modified Medications   Modified Medication Previous Medication   DIVALPROEX (DEPAKOTE) 250 MG DR TABLET divalproex (DEPAKOTE) 250 MG DR tablet      TAKE ONE TABLET BY MOUTH THREE TIMES DAILY FOR SEIZURES    Take one tablet by mouth three times daily for seizures  Discontinued Medications   No medications on file     Physical Exam:  Vitals:   06/25/17 0831  BP: (!) 160/78  Pulse: 70  Temp: 98.2 F (36.8 C)  TempSrc: Oral  SpO2: 99%  Weight: 136 lb (61.7 kg)  Height: 5\' 8"  (1.727 m)   Body mass index is 20.68 kg/m.  Physical Exam  Constitutional: No distress.  Frail thin male NAD.   HENT:  Mouth/Throat: Oropharynx is clear and moist. No oropharyngeal exudate.  Cardiovascular: Normal rate, regular rhythm and normal heart sounds.  Pulmonary/Chest: Effort normal and breath sounds normal. No respiratory distress.  Abdominal: Soft. Bowel sounds are normal.  Musculoskeletal: Normal range of motion. He exhibits no edema or tenderness.  Neurological: He is alert.  Skin: Skin is warm. No rash noted.  Psychiatric: His affect is labile. Cognition and memory are impaired. He exhibits abnormal recent memory.       Labs reviewed: Basic Metabolic Panel: Recent Labs    08/19/16 1623  02/02/17 1012 03/10/17 1018 06/09/17 1033  NA  --    < > 139 145 142  K  --    < > 4.6 4.5 4.6  CL  --    < > 104 110 106  CO2  --    < > 28 30 30   GLUCOSE  --    < > 89 91 81  BUN  --    < > 29* 28* 18  CREATININE  --    < > 1.74* 1.48* 1.32*  CALCIUM  --    < > 9.0 9.2 9.2  TSH 3.541  --   --   --   --    < > = values in this interval not displayed.   Liver Function Tests: Recent Labs    08/18/16 1348 08/21/16 1144 02/02/17 1012 06/09/17 1033  AST 29 25 19 19   ALT 22 20 15 9   ALKPHOS 55 56  --   --   BILITOT 0.5 0.5 0.3 0.3  PROT  6.8 6.6 7.0 6.9  ALBUMIN 2.9* 2.3*  --   --    No results for input(s): LIPASE,  AMYLASE in the last 8760 hours. No results for input(s): AMMONIA in the last 8760 hours. CBC: Recent Labs    10/22/16 0940 11/21/16 1126 06/09/17 1033  WBC 5.9 6.4 5.9  NEUTROABS 1.6 2.2 1,422*  HGB 11.1* 11.7* 12.2*  HCT 34.6* 35.8* 37.0*  MCV 88.0 87.7 89.2  PLT 150 146 170   Lipid Panel: Recent Labs    06/09/17 1033  CHOL 219*  HDL 69  LDLCALC 135*  TRIG 63  CHOLHDL 3.2   TSH: Recent Labs    08/19/16 1623  TSH 3.541   A1C: Lab Results  Component Value Date   HGBA1C 5.2 04/15/2016     Assessment/Plan 1. Paroxysmal atrial fibrillation (HCC) Rate controlled, continues on ASA only for anticoagulation due to hx of ETOH abuse - carvedilol (COREG) 3.125 MG tablet; Take 1 tablet (3.125 mg total) by mouth 2 (two) times daily with a meal.  Dispense: 180 tablet; Refill: 3  2. Essential hypertension, benign Not controlled, will continue coreg and increased hydralazine to 50 mg daily  - hydrALAZINE (APRESOLINE) 50 MG tablet; Take 1 tablet (50 mg total) by mouth 2 (two) times daily.  Dispense: 60 tablet; Refill: 1 -if blood pressure remains elevated consider adding ACE/ARB as renal function has improvede  Next appt: 07/28/2017 Carlos American. [place] [phone]

## 2017-06-25 NOTE — Patient Instructions (Signed)
increase hydralazine to 50 mg daily for blood pressure   DASH Eating Plan DASH stands for "Dietary Approaches to Stop Hypertension." The DASH eating plan is a healthy eating plan that has been shown to reduce high blood pressure (hypertension). It may also reduce your risk for type 2 diabetes, heart disease, and stroke. The DASH eating plan may also help with weight loss. What are tips for following this plan? General guidelines  Avoid eating more than 2,300 mg (milligrams) of salt (sodium) a day. If you have hypertension, you may need to reduce your sodium intake to 1,500 mg a day.  Limit alcohol intake to no more than 1 drink a day for nonpregnant women and 2 drinks a day for men. One drink equals 12 oz of beer, 5 oz of wine, or 1 oz of hard liquor.  Work with your health care provider to maintain a healthy body weight or to lose weight. Ask what an ideal weight is for you.  Get at least 30 minutes of exercise that causes your heart to beat faster (aerobic exercise) most days of the week. Activities may include walking, swimming, or biking.  Work with your health care provider or diet and nutrition specialist (dietitian) to adjust your eating plan to your individual calorie needs. Reading food labels  Check food labels for the amount of sodium per serving. Choose foods with less than 5 percent of the Daily Value of sodium. Generally, foods with less than 300 mg of sodium per serving fit into this eating plan.  To find whole grains, look for the word "whole" as the first word in the ingredient list. Shopping  Buy products labeled as "low-sodium" or "no salt added."  Buy fresh foods. Avoid canned foods and premade or frozen meals. Cooking  Avoid adding salt when cooking. Use salt-free seasonings or herbs instead of table salt or sea salt. Check with your health care provider or pharmacist before using salt substitutes.  Do not fry foods. Cook foods using healthy methods such as  baking, boiling, grilling, and broiling instead.  Cook with heart-healthy oils, such as olive, canola, soybean, or sunflower oil. Meal planning   Eat a balanced diet that includes: ? 5 or more servings of fruits and vegetables each day. At each meal, try to fill half of your plate with fruits and vegetables. ? Up to 6-8 servings of whole grains each day. ? Less than 6 oz of lean meat, poultry, or fish each day. A 3-oz serving of meat is about the same size as a deck of cards. One egg equals 1 oz. ? 2 servings of low-fat dairy each day. ? A serving of nuts, seeds, or beans 5 times each week. ? Heart-healthy fats. Healthy fats called Omega-3 fatty acids are found in foods such as flaxseeds and coldwater fish, like sardines, salmon, and mackerel.  Limit how much you eat of the following: ? Canned or prepackaged foods. ? Food that is high in trans fat, such as fried foods. ? Food that is high in saturated fat, such as fatty meat. ? Sweets, desserts, sugary drinks, and other foods with added sugar. ? Full-fat dairy products.  Do not salt foods before eating.  Try to eat at least 2 vegetarian meals each week.  Eat more home-cooked food and less restaurant, buffet, and fast food.  When eating at a restaurant, ask that your food be prepared with less salt or no salt, if possible. What foods are recommended? The items listed may  not be a complete list. Talk with your dietitian about what dietary choices are best for you. Grains Whole-grain or whole-wheat bread. Whole-grain or whole-wheat pasta. Brown rice. [place]row. Bulgur. Whole-grain and low-sodium cereals. Pita bread. Low-fat, low-sodium crackers. Whole-wheat flour tortillas. Vegetables Fresh or frozen vegetables (raw, steamed, roasted, or grilled). Low-sodium or reduced-sodium tomato and vegetable juice. Low-sodium or reduced-sodium tomato sauce and tomato paste. Low-sodium or reduced-sodium canned vegetables. Fruits All fresh,  dried, or frozen fruit. Canned fruit in natural juice (without added sugar). Meat and other protein foods Skinless chicken or [place]. Ground chicken or [place]. Pork with fat trimmed off. Fish and seafood. Egg whites. Dried beans, peas, or lentils. Unsalted nuts, nut butters, and seeds. Unsalted canned beans. Lean cuts of beef with fat trimmed off. Low-sodium, lean deli meat. Dairy Low-fat (1%) or fat-free (skim) milk. Fat-free, low-fat, or reduced-fat cheeses. Nonfat, low-sodium ricotta or cottage cheese. Low-fat or nonfat yogurt. Low-fat, low-sodium cheese. Fats and oils Soft margarine without trans fats. Vegetable oil. Low-fat, reduced-fat, or light mayonnaise and salad dressings (reduced-sodium). Canola, safflower, olive, soybean, and sunflower oils. Avocado. Seasoning and other foods Herbs. Spices. Seasoning mixes without salt. Unsalted popcorn and pretzels. Fat-free sweets. What foods are not recommended? The items listed may not be a complete list. Talk with your dietitian about what dietary choices are best for you. Grains Baked goods made with fat, such as croissants, muffins, or some breads. Dry pasta or rice meal packs. Vegetables Creamed or fried vegetables. Vegetables in a cheese sauce. Regular canned vegetables (not low-sodium or reduced-sodium). Regular canned tomato sauce and paste (not low-sodium or reduced-sodium). Regular tomato and vegetable juice (not low-sodium or reduced-sodium). Angie Fava. Olives. Fruits Canned fruit in a light or heavy syrup. Fried fruit. Fruit in cream or butter sauce. Meat and other protein foods Fatty cuts of meat. Ribs. Fried meat. Berniece Salines. Sausage. Bologna and other processed lunch meats. Salami. Fatback. Hotdogs. Bratwurst. Salted nuts and seeds. Canned beans with added salt. Canned or smoked fish. Whole eggs or egg yolks. Chicken or [place] with skin. Dairy Whole or 2% milk, cream, and half-and-half. Whole or full-fat cream cheese. Whole-fat or sweetened  yogurt. Full-fat cheese. Nondairy creamers. Whipped toppings. Processed cheese and cheese spreads. Fats and oils Butter. Stick margarine. Lard. Shortening. Ghee. Bacon fat. Tropical oils, such as coconut, palm kernel, or palm oil. Seasoning and other foods Salted popcorn and pretzels. Onion salt, garlic salt, seasoned salt, table salt, and sea salt. Worcestershire sauce. Tartar sauce. Barbecue sauce. Teriyaki sauce. Soy sauce, including reduced-sodium. Steak sauce. Canned and packaged gravies. Fish sauce. Oyster sauce. Cocktail sauce. Horseradish that you find on the shelf. Ketchup. Mustard. Meat flavorings and tenderizers. Bouillon cubes. Hot sauce and Tabasco sauce. Premade or packaged marinades. Premade or packaged taco seasonings. Relishes. Regular salad dressings. Where to find more information:  National Heart, Lung, and [place]: https://wilson-eaton.com/  American Heart Association: www.heart.org Summary  The DASH eating plan is a healthy eating plan that has been shown to reduce high blood pressure (hypertension). It may also reduce your risk for type 2 diabetes, heart disease, and stroke.  With the DASH eating plan, you should limit salt (sodium) intake to 2,300 mg a day. If you have hypertension, you may need to reduce your sodium intake to 1,500 mg a day.  When on the DASH eating plan, aim to eat more fresh fruits and vegetables, whole grains, lean proteins, low-fat dairy, and heart-healthy fats.  Work with your health care provider or diet and  nutrition specialist (dietitian) to adjust your eating plan to your individual calorie needs. This information is not intended to replace advice given to you by your health care provider. Make sure you discuss any questions you have with your health care provider. Document Released: 03/13/2011 Document Revised: 03/17/2016 Document Reviewed: 03/17/2016 Elsevier Interactive Patient Education  [employer].

## 2017-07-28 ENCOUNTER — Encounter: Payer: Self-pay | Admitting: Nurse Practitioner

## 2017-07-28 ENCOUNTER — Ambulatory Visit: Payer: Medicare Other | Admitting: Nurse Practitioner

## 2017-07-28 VITALS — BP 148/82 | HR 63 | Temp 98.4°F | Ht 68.0 in | Wt 137.0 lb

## 2017-07-28 DIAGNOSIS — E782 Mixed hyperlipidemia: Secondary | ICD-10-CM | POA: Diagnosis not present

## 2017-07-28 DIAGNOSIS — I10 Essential (primary) hypertension: Secondary | ICD-10-CM | POA: Diagnosis not present

## 2017-07-28 DIAGNOSIS — N183 Chronic kidney disease, stage 3 unspecified: Secondary | ICD-10-CM

## 2017-07-28 MED ORDER — LOSARTAN POTASSIUM 25 MG PO TABS: 25.0000 mg | ORAL_TABLET | Freq: Every day | ORAL | 1 refills | Status: DC

## 2017-07-28 NOTE — Patient Instructions (Signed)
Start losartan 25 mg by mouth daily for better blood pressure control To follow up in 2 weeks- make sure to take all medication prior to visit.

## 2017-07-28 NOTE — Progress Notes (Signed)
Careteam: Patient Care Team: [name], NP as PCP - General (Nurse Practitioner)  Advanced Directive information    Allergies  Allergen Reactions  . Diltiazem Rash and Other (See Comments)    Blisters  . Penicillins Other (See Comments)    Unknown Childhood allergy  Has patient had a PCN reaction causing immediate rash, facial/tongue/throat swelling, SOB or lightheadedness with hypotension: unknown Has patient had a PCN reaction causing severe rash involving mucus membranes or skin necrosis: unknown Has patient had a PCN reaction that required hospitalization unknown Has patient had a PCN reaction occurring within the last 10 years:no If all of the above answers are "NO", then may proceed with Cephalosporin use.     Chief Complaint  Patient presents with  . Follow-up    Pt is being seen for a 1 month follow up on blood pressure.   Thom Chimes, in room     HPI: Patient is a 77 y.o. male seen in the office today to follow up on blood pressure.  hydralazine was increased to 50 mg BID due to elevated blood pressure last month. He also continues on coreg 3.125 mg BID.  No side effects noted from medications.  Not compliant with low sodium diet due to poor PO intake.   Review of Systems:  Review of Systems  Unable to perform ROS: Dementia     Past Medical History:  Diagnosis Date  . Alcohol abuse   . Anemia   . Aneurysm ([place])   . CKD (chronic kidney disease), stage III ([place])   . COPD (chronic obstructive pulmonary disease) ([place])   . Dysrhythmia   . Fever of unknown origin 08/2016  . History of atrial fibrillation   . Hyperchloremia   . Hypercholesteremia   . Hyperpotassemia   . Hypertension   . Hypertensive renal disease, benign   . Leg edema   . PAD (peripheral artery disease) ([place])   . Pneumonia   . Pressure ulcer of left heel   . Seizures ([place])    has not had a seizure in 15 yrs  . Tobacco use   . Weight loss    Past Surgical  History:  Procedure Laterality Date  . ABDOMINAL AORTOGRAM W/LOWER EXTREMITY N/A 08/13/2016   Procedure: Abdominal Aortogram w/Lower Extremity;  Surgeon: Wellington Hampshire, MD;  Location: [facility];  Service: Cardiovascular;  Laterality: N/A;  . 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, 1996  . EYE SURGERY     December 2017  . PERIPHERAL VASCULAR BALLOON ANGIOPLASTY  08/13/2016   Procedure: Peripheral Vascular Balloon Angioplasty;  Surgeon: Wellington Hampshire, MD;  Location: [facility];  Service: Cardiovascular;;  Aborted   Social History:   reports that he has quit smoking. His smoking use included cigarettes. He has a 14.00 pack-year smoking history. He has never used smokeless tobacco. He reports that he does not drink alcohol or use drugs.  Family History  Problem Relation Age of Onset  . Cancer Mother   . Congestive Heart Failure Father   . Alzheimer's disease Sister   . Congestive Heart Failure Sister   . Clotting disorder Sister   . Congestive Heart Failure Brother   . Clotting disorder Sister     Medications: Patient's Medications  New Prescriptions   No medications on file  Previous Medications   ASPIRIN EC 81 MG TABLET    Take 1 tablet (81 mg total) by mouth daily.  ATORVASTATIN (LIPITOR) 20 MG TABLET    Take 1 tablet (20 mg total) by mouth daily.   CARVEDILOL (COREG) 3.125 MG TABLET    Take 1 tablet (3.125 mg total) by mouth 2 (two) times daily with a meal.   DEXTROMETHORPHAN-MENTHOL (DELSYM COUGH RELIEF MT)    Use as directed 10 mLs in the mouth or throat daily as needed.   DIVALPROEX (DEPAKOTE) 250 MG DR TABLET    TAKE ONE TABLET BY MOUTH THREE TIMES DAILY FOR SEIZURES   HYDRALAZINE (APRESOLINE) 50 MG TABLET    Take 1 tablet (50 mg total) by mouth 2 (two) times daily.   LEVETIRACETAM (KEPPRA) 500 MG TABLET    TAKE 1 TABLET BY MOUTH TWICE A DAY   MEMANTINE (NAMENDA) 10 MG TABLET    TAKE 1 TABLET BY MOUTH TWICE A DAY   MULTIPLE VITAMINS-MINERALS (THEREMS-M) TABS     Take 1 tablet by mouth daily.   THIAMINE ([facility] B-1) 100 MG TABLET    Take 1 tablet (100 mg total) by mouth daily.  Modified Medications   No medications on file  Discontinued Medications   No medications on file     Physical Exam:  Vitals:   07/28/17 0940  BP: (!) 148/82  Pulse: 63  Temp: 98.4 F (36.9 C)  TempSrc: Oral  SpO2: 99%  Weight: 137 lb (62.1 kg)  Height: 5\' 8"  (1.727 m)   Body mass index is 20.83 kg/m.  Physical Exam  Constitutional: No distress.  Frail thin male NAD.   HENT:  Mouth/Throat: Oropharynx is clear and moist. No oropharyngeal exudate.  Cardiovascular: Normal rate, regular rhythm and normal heart sounds.  Pulmonary/Chest: Effort normal and breath sounds normal. No respiratory distress.  Abdominal: Soft. Bowel sounds are normal.  Musculoskeletal: Normal range of motion. He exhibits no edema or tenderness.  Neurological: He is alert.  Skin: Skin is warm. No rash noted.  Psychiatric: His affect is labile. Cognition and memory are impaired. He exhibits abnormal recent memory.       Labs reviewed: Basic Metabolic Panel: Recent Labs    08/19/16 1623  02/02/17 1012 03/10/17 1018 06/09/17 1033  NA  --    < > 139 145 142  K  --    < > 4.6 4.5 4.6  CL  --    < > 104 110 106  CO2  --    < > 28 30 30   GLUCOSE  --    < > 89 91 81  BUN  --    < > 29* 28* 18  CREATININE  --    < > 1.74* 1.48* 1.32*  CALCIUM  --    < > 9.0 9.2 9.2  TSH 3.541  --   --   --   --    < > = values in this interval not displayed.   Liver Function Tests: Recent Labs    08/18/16 1348 08/21/16 1144 02/02/17 1012 06/09/17 1033  AST 29 25 19 19   ALT 22 20 15 9   ALKPHOS 55 56  --   --   BILITOT 0.5 0.5 0.3 0.3  PROT 6.8 6.6 7.0 6.9  ALBUMIN 2.9* 2.3*  --   --    No results for input(s): LIPASE, AMYLASE in the last 8760 hours. No results for input(s): AMMONIA in the last 8760 hours. CBC: Recent Labs    10/22/16 0940 11/21/16 1126 06/09/17 1033  WBC 5.9 6.4  5.9  NEUTROABS 1.6 2.2 1,422*  HGB  11.1* 11.7* 12.2*  HCT 34.6* 35.8* 37.0*  MCV 88.0 87.7 89.2  PLT 150 146 170   Lipid Panel: Recent Labs    06/09/17 1033  CHOL 219*  HDL 69  LDLCALC 135*  TRIG 63  CHOLHDL 3.2   TSH: Recent Labs    08/19/16 1623  TSH 3.541   A1C: Lab Results  Component Value Date   HGBA1C 5.2 04/15/2016     Assessment/Plan 1. Essential hypertension -not at goal, to continue current regimen and will add losartan 25 mg daily, follow up labs at next OV, encouraged to decrease sodium intake when able.  - losartan (COZAAR) 25 MG tablet; Take 1 tablet (25 mg total) by mouth daily.  Dispense: 90 tablet; Refill: 1  2. CKD (chronic kidney disease), stage III (HCC) Stable, will start losartan and follow up labs in 2 weeks.   3. Mixed hyperlipidemia Lipitor increased due to increased to 20 mg in March due to in LDL,  Will follow up CMP with next OV  Next appt: 2 weeks, blood pressure check and CMP Torie Priebe K. [place], [facility] [phone]

## 2017-07-30 ENCOUNTER — Other Ambulatory Visit: Payer: Self-pay | Admitting: Nurse Practitioner

## 2017-07-31 ENCOUNTER — Other Ambulatory Visit: Payer: Self-pay | Admitting: Nurse Practitioner

## 2017-07-31 ENCOUNTER — Other Ambulatory Visit: Payer: Self-pay

## 2017-07-31 DIAGNOSIS — I10 Essential (primary) hypertension: Secondary | ICD-10-CM

## 2017-07-31 MED ORDER — HYDRALAZINE HCL 50 MG PO TABS: 50.0000 mg | ORAL_TABLET | Freq: Two times a day (BID) | ORAL | 1 refills | Status: DC

## 2017-07-31 NOTE — Telephone Encounter (Signed)
A [facility] mg, however, pt has had a dosage increase to 50 mg. The request for 25 mg was denied and a prescription for the correct dosage was went to the pharmacy electronically.

## 2017-08-03 ENCOUNTER — Ambulatory Visit (INDEPENDENT_AMBULATORY_CARE_PROVIDER_SITE_OTHER)
Admission: RE | Admit: 2017-08-03 | Discharge: 2017-08-03 | Disposition: A | Discharge status: Home / Self Care | Payer: Medicare Other | Source: Ambulatory Visit | Attending: Pulmonary Disease | Admitting: Pulmonary Disease

## 2017-08-03 ENCOUNTER — Encounter: Payer: Self-pay | Admitting: Pulmonary Disease

## 2017-08-03 ENCOUNTER — Ambulatory Visit: Payer: Medicare Other | Admitting: Pulmonary Disease

## 2017-08-03 VITALS — BP 118/80 | HR 63 | Ht 68.0 in | Wt 136.8 lb

## 2017-08-03 DIAGNOSIS — J449 Chronic obstructive pulmonary disease, unspecified: Secondary | ICD-10-CM | POA: Diagnosis not present

## 2017-08-03 NOTE — Patient Instructions (Signed)
Your oxygen levels were good today We will continue to observe you off inhalers Congratulations on not smoking again.  Please ensure that you stay off cigarettes You can follow-up in the pulmonary clinic as needed.  Please call if there is any worsening of shortness of breath.

## 2017-08-03 NOTE — Progress Notes (Signed)
[name]    [id number]    [birth year]  Primary Care Physician:Eubanks, Carlos American, NP  Referring Physician: [name], NP [place], [place] [address]  Chief complaint:   Follow-up for left lower lobe consolidation COPD GOLD B  HPI: 77 year old with history of PVD, COPD GOLD B (CAT score 10, 0 exacerbations), hypertension, hyperlipidemia, active tobacco and alcohol use, seizure disorder, brain aneurysm.   He was admitted to hospital on 08/18/16 with cough, fever, rash, confusion, elevated LA, AKI. He was treated for LLL pneumonia with vanco, cefepime initially, This has been changed to oral vantin on 5/16. 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  During the admission a  CT abdomen pelvis and a CT chest which showed left lower lobe consolidation with narrowing of lower lobe atelectasis, mediastinal adenopathy. PCCM was consulted for further management and recommended to treat PNA and repeat CT scan. He returns to the clinic today after having completed antibiotic course. His repeat CT scan shows resolution of the abnormalities. He feels well with no cough, sputum production, fevers, chills. He has mild dyspnea on exertion and is not on any inhalers.   Interim history: Continues to be off cigarettes.  States that dyspnea on exertion is at baseline.  Denies any symptoms at rest, cough, sputum production, wheezing. He is here with his sister.  They are grieving as they lost their eldest sister to Alzheimer's last week.  Outpatient Encounter Medications as of 08/03/2017  Medication Sig  . aspirin EC 81 MG tablet Take 1 tablet (81 mg total) by mouth daily.  Marland Kitchen atorvastatin (LIPITOR) 20 MG tablet Take 1 tablet (20 mg total) by mouth daily.  . carvedilol (COREG) 3.125 MG tablet Take 1 tablet (3.125 mg total) by mouth 2 (two) times daily with a meal.  . Dextromethorphan-Menthol  (DELSYM COUGH RELIEF MT) Use as directed 10 mLs in the mouth or throat daily as needed.  . divalproex (DEPAKOTE) 250 MG DR tablet TAKE ONE TABLET BY MOUTH THREE TIMES DAILY FOR SEIZURES  . hydrALAZINE (APRESOLINE) 50 MG tablet Take 1 tablet (50 mg total) by mouth 2 (two) times daily.  Marland Kitchen levETIRAcetam (KEPPRA) 500 MG tablet TAKE 1 TABLET BY MOUTH TWICE A DAY  . losartan (COZAAR) 25 MG tablet Take 1 tablet (25 mg total) by mouth daily.  . memantine (NAMENDA) 10 MG tablet TAKE 1 TABLET BY MOUTH TWICE A DAY  . Multiple Vitamins-Minerals (THEREMS-M) TABS TAKE 1 TABLET BY MOUTH EVERY DAY  . thiamine ([facility] B-1) 100 MG tablet Take 1 tablet (100 mg total) by mouth daily.   No facility-administered encounter medications on file as of 08/03/2017.     Allergies as of 08/03/2017 - Review Complete 08/03/2017  Allergen Reaction Noted  . Diltiazem Rash and Other (See Comments) 05/08/2016  . Penicillins Other (See Comments) 07/28/2012    Past Medical History:  Diagnosis Date  . Alcohol abuse   . Anemia   . Aneurysm ([place])   . CKD (chronic kidney disease), stage III ([place])   . COPD (chronic obstructive pulmonary disease) ([place])   . Dysrhythmia   . Fever of unknown origin 08/2016  . History of atrial fibrillation   . Hyperchloremia   . Hypercholesteremia   . Hyperpotassemia   . Hypertension   . Hypertensive renal disease, benign   . Leg edema   . PAD (peripheral  artery disease) ([place])   . Pneumonia   . Pressure ulcer of left heel   . Seizures ([place])    has not had a seizure in 15 yrs  . Tobacco use   . Weight loss     Past Surgical History:  Procedure Laterality Date  . ABDOMINAL AORTOGRAM W/LOWER EXTREMITY N/A 08/13/2016   Procedure: Abdominal Aortogram w/Lower Extremity;  Surgeon: Wellington Hampshire, MD;  Location: [facility];  Service: Cardiovascular;  Laterality: N/A;  . 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, 1996  . EYE SURGERY     December 2017  . PERIPHERAL VASCULAR BALLOON ANGIOPLASTY   08/13/2016   Procedure: Peripheral Vascular Balloon Angioplasty;  Surgeon: Wellington Hampshire, MD;  Location: [facility];  Service: Cardiovascular;;  Aborted    Family History  Problem Relation Age of Onset  . Cancer Mother   . Congestive Heart Failure Father   . Alzheimer's disease Sister   . Congestive Heart Failure Sister   . Clotting disorder Sister   . Congestive Heart Failure Brother   . Clotting disorder Sister     Social History   Socioeconomic History  . Marital status: Single    Spouse name: Not on file  . Number of children: Not on file  . Years of education: Not on file  . Highest education level: Not on file  Occupational History  . Not on file  Social Needs  . Financial resource strain: Not hard at all  . Food insecurity:    Worry: Never true    Inability: Never true  . Transportation needs:    Medical: No    Non-medical: No  Tobacco Use  . Smoking status: Former Smoker    Packs/day: 0.25    Years: 56.00    Pack years: 14.00    Types: Cigarettes    Last attempt to quit: 2018    Years since quitting: 1.3  . Smokeless tobacco: Never Used  Substance and Sexual Activity  . Alcohol use: No    Alcohol/week: 0.0 oz    Comment: QUIT DRINKING  04/2016  . Drug use: No  . Sexual activity: Not Currently  Lifestyle  . Physical activity:    Days per week: 0 days    Minutes per session: 0 min  . Stress: Not at all  Relationships  . Social connections:    Talks on phone: More than three times a week    Gets together: More than three times a week    Attends religious service: Never    Active member of club or organization: No    Attends meetings of clubs or organizations: Never    Relationship status: Never married  . Intimate partner violence:    Fear of current or ex partner: No    Emotionally abused: No    Physically abused: No    Forced sexual activity: No  Other Topics Concern  . Not on file  Social History Narrative  . Not on file    Review of systems: Review of Systems  Constitutional: Negative for fever and chills.  HENT: Negative.   Eyes: Negative for blurred vision.  Respiratory: as per HPI  Cardiovascular: Negative for chest pain and palpitations.  Gastrointestinal: Negative for vomiting, diarrhea, blood per rectum. Genitourinary: Negative for dysuria, urgency, frequency and hematuria.  Musculoskeletal: Negative for myalgias, back pain and joint pain.  Skin: Negative for itching and rash.  Neurological: Negative for dizziness, tremors, focal weakness, seizures and loss  of consciousness.  Endo/Heme/Allergies: Negative for environmental allergies.  Psychiatric/Behavioral: Negative for depression, suicidal ideas and hallucinations.  All other systems reviewed and are negative.  Physical Exam: Blood pressure 118/80, pulse 63, height '5\' 8"'  (1.727 m), weight 136 lb 12.8 oz (62.1 kg), SpO2 97 %. Gen:      No acute distress HEENT:  EOMI, sclera anicteric Neck:     No masses; no thyromegaly Lungs:    Clear to auscultation bilaterally; normal respiratory effort CV:         Regular rate and rhythm; no murmurs Abd:      + bowel sounds; soft, non-tender; no palpable masses, no distension Ext:    No edema; adequate peripheral perfusion Skin:      Warm and dry; no rash Neuro: alert and oriented x 3 Psych: normal mood and affect  Data Reviewed: CT abdomen pelvis 08/20/16- left lower lobe consolidation with small effusion. CT chest 08/22/16- ill-defined left perihilar soft tissue density with bronchial narrowing in posterobasal segments, left lower lobe consolidation. CT chest scan 10/17/16- resolution of left lower lobe consolidation, pleural effusion. Aortic and coronary atherosclerosis. Mild centrilobular and paraseptal emphysema. I have reviewed images personally.  PFTs 02/03/17 FVC 1.28 [41%], FEV1 0.91 (40%], F/F 71, TLC 47, RV/TLC 174, DLCO 29% Severe obstruction with restriction and diffusion impairment, air  trapping  Assessment:  Follow up for LLL pneumonia Follow-up CT shows resolution of the pneumonia with no residual lung infiltrate, mass, nodule.     COPD GOLD B PFTs reviewed which show significant obstruction with restriction and diffusion impairment but he continues to be asymptomatic. He is doing well off inhalers and does not want to try any therapy Did not desat on exertion. We will check chest x-ray. He can return to pulmonary clinic as needed.  Ex smoker Quit smoking in June 2018   Health maintenance 11/26/2016- Influenza vaccine 04/12/2015- Prenvar 13 01/28/2018-Pneumovax 23.  Plan/Recommendations: - Monitor symptoms off inhalers - Return to pulmonary clinic as needed - Chest x-ray and check oxygen levels on exertion.   Marshell Garfinkel MD [facility] Pager [phone] 08/03/2017, 9:47 AM  CC: [name], NP

## 2017-08-11 ENCOUNTER — Ambulatory Visit: Payer: Medicare Other | Admitting: Nurse Practitioner

## 2017-08-11 ENCOUNTER — Encounter: Payer: Self-pay | Admitting: Nurse Practitioner

## 2017-08-11 VITALS — BP 122/78 | HR 62 | Temp 98.7°F | Ht 68.0 in | Wt 138.2 lb

## 2017-08-11 DIAGNOSIS — I10 Essential (primary) hypertension: Secondary | ICD-10-CM | POA: Diagnosis not present

## 2017-08-11 DIAGNOSIS — R569 Unspecified convulsions: Secondary | ICD-10-CM | POA: Diagnosis not present

## 2017-08-11 DIAGNOSIS — R634 Abnormal weight loss: Secondary | ICD-10-CM | POA: Diagnosis not present

## 2017-08-11 DIAGNOSIS — F0391 Unspecified dementia with behavioral disturbance: Secondary | ICD-10-CM | POA: Diagnosis not present

## 2017-08-11 DIAGNOSIS — J449 Chronic obstructive pulmonary disease, unspecified: Secondary | ICD-10-CM | POA: Diagnosis not present

## 2017-08-11 DIAGNOSIS — N183 Chronic kidney disease, stage 3 unspecified: Secondary | ICD-10-CM

## 2017-08-11 LAB — BASIC METABOLIC PANEL WITH GFR
BUN / CREAT RATIO: 13 (calc) (ref 6–22)
BUN: 16 mg/dL (ref 7–25)
CO2: 29 mmol/L (ref 20–32)
CREATININE: 1.27 mg/dL — AB (ref 0.70–1.18)
Calcium: 8.8 mg/dL (ref 8.6–10.3)
Chloride: 106 mmol/L (ref 98–110)
GFR, EST AFRICAN AMERICAN: 62 mL/min/{1.73_m2} (ref >=60)
GFR, EST NON AFRICAN AMERICAN: 54 mL/min/{1.73_m2} — AB (ref >=60)
Glucose, Bld: 86 mg/dL (ref 65–99)
POTASSIUM: 4.4 mmol/L (ref 3.5–5.3)
SODIUM: 142 mmol/L (ref 135–146)

## 2017-08-11 NOTE — Progress Notes (Signed)
Careteam: Patient Care Team: [name], NP as PCP - General (Nurse Practitioner)  Advanced Directive information    Allergies  Allergen Reactions  . Diltiazem Rash and Other (See Comments)    Blisters  . Penicillins Other (See Comments)    Unknown Childhood allergy  Has patient had a PCN reaction causing immediate rash, facial/tongue/throat swelling, SOB or lightheadedness with hypotension: unknown Has patient had a PCN reaction causing severe rash involving mucus membranes or skin necrosis: unknown Has patient had a PCN reaction that required hospitalization unknown Has patient had a PCN reaction occurring within the last 10 years:no If all of the above answers are "NO", then may proceed with Cephalosporin use.     Chief Complaint  Patient presents with  . Follow-up    Pt is being seen for a 2 week follow up on blood pressure.      HPI: Patient is a 77 y.o. male seen in the office today for blood pressure follow up.  Pt was taking hydralazine and coreg for blood pressure however blood pressure was not at goal and losartan was added.  Pt does not maintain a low sodium diet. Niece who is caregiver has liberalized diet due to poor intake. He has gained weight.   His sister recently died, they lived together. Caregivers still coming in to check on [name] Pt does not like women to help him bath but they have family friend that comes to assist him with that.   Planning to have cataract surgery soon, now that blood pressure is better they plan on scheduling.   COPD- off inhalers doing well. Saw pulmonologist 08/03/17 and now doing much better- following PRN at this time.   Seizures- no recent seizures- very compliant with medication  - taking both keppra and Depakote  Review of Systems:  Review of Systems  Unable to perform ROS: Dementia    Past Medical History:  Diagnosis Date  . Alcohol abuse   . Anemia   . Aneurysm ([place])   . CKD (chronic kidney disease),  stage III ([place])   . COPD (chronic obstructive pulmonary disease) ([place])   . Dysrhythmia   . Fever of unknown origin 08/2016  . History of atrial fibrillation   . Hyperchloremia   . Hypercholesteremia   . Hyperpotassemia   . Hypertension   . Hypertensive renal disease, benign   . Leg edema   . PAD (peripheral artery disease) ([place])   . Pneumonia   . Pressure ulcer of left heel   . Seizures ([place])    has not had a seizure in 15 yrs  . Tobacco use   . Weight loss    Past Surgical History:  Procedure Laterality Date  . ABDOMINAL AORTOGRAM W/LOWER EXTREMITY N/A 08/13/2016   Procedure: Abdominal Aortogram w/Lower Extremity;  Surgeon: Wellington Hampshire, MD;  Location: [facility];  Service: Cardiovascular;  Laterality: N/A;  . 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, 1996  . EYE SURGERY     December 2017  . PERIPHERAL VASCULAR BALLOON ANGIOPLASTY  08/13/2016   Procedure: Peripheral Vascular Balloon Angioplasty;  Surgeon: Wellington Hampshire, MD;  Location: [facility];  Service: Cardiovascular;;  Aborted   Social History:   reports that he quit smoking about 16 months ago. His smoking use included cigarettes. He has a 14.00 pack-year smoking history. He has never used smokeless tobacco. He reports that he does not drink alcohol or use drugs.  Family History  Problem Relation Age of Onset  . Cancer Mother   . Congestive Heart Failure Father   . Alzheimer's disease Sister   . Congestive Heart Failure Sister   . Clotting disorder Sister   . Congestive Heart Failure Brother   . Clotting disorder Sister     Medications: Patient's Medications  New Prescriptions   No medications on file  Previous Medications   ASPIRIN EC 81 MG TABLET    Take 1 tablet (81 mg total) by mouth daily.   ATORVASTATIN (LIPITOR) 20 MG TABLET    Take 1 tablet (20 mg total) by mouth daily.   CARVEDILOL (COREG) 3.125 MG TABLET    Take 1 tablet (3.125 mg total) by mouth 2 (two) times daily with a meal.    DEXTROMETHORPHAN-MENTHOL (DELSYM COUGH RELIEF MT)    Use as directed 10 mLs in the mouth or throat daily as needed.   DIVALPROEX (DEPAKOTE) 250 MG DR TABLET    TAKE ONE TABLET BY MOUTH THREE TIMES DAILY FOR SEIZURES   HYDRALAZINE (APRESOLINE) 50 MG TABLET    Take 1 tablet (50 mg total) by mouth 2 (two) times daily.   LEVETIRACETAM (KEPPRA) 500 MG TABLET    TAKE 1 TABLET BY MOUTH TWICE A DAY   LOSARTAN (COZAAR) 25 MG TABLET    Take 1 tablet (25 mg total) by mouth daily.   MEMANTINE (NAMENDA) 10 MG TABLET    TAKE 1 TABLET BY MOUTH TWICE A DAY   MULTIPLE VITAMINS-MINERALS (THEREMS-M) TABS    TAKE 1 TABLET BY MOUTH EVERY DAY   THIAMINE ([facility] B-1) 100 MG TABLET    Take 1 tablet (100 mg total) by mouth daily.  Modified Medications   No medications on file  Discontinued Medications   No medications on file     Physical Exam:  Vitals:   08/11/17 0942  BP: 122/78  Pulse: 62  Temp: 98.7 F (37.1 C)  TempSrc: Oral  SpO2: 98%  Weight: 138 lb 3.2 oz (62.7 kg)  Height: 5\' 8"  (1.727 m)   Body mass index is 21.01 kg/m.  Physical Exam  Constitutional: No distress.  Frail thin male NAD.   HENT:  Head: Normocephalic and atraumatic.  Right Ear: External ear normal.  Left Ear: External ear normal.  Mouth/Throat: Oropharynx is clear and moist. No oropharyngeal exudate.  Eyes: Pupils are equal, round, and reactive to light. EOM are normal.  Neck: Normal range of motion. Neck supple.  Cardiovascular: Normal rate, regular rhythm and normal heart sounds.  Pulmonary/Chest: Effort normal and breath sounds normal. No respiratory distress.  Abdominal: Soft. Bowel sounds are normal.  Musculoskeletal: Normal range of motion. He exhibits no edema or tenderness.  Neurological: He is alert.  Skin: Skin is warm. No rash noted.  Psychiatric: His affect is labile. Cognition and memory are impaired. He exhibits abnormal recent memory.    Labs reviewed: Basic Metabolic Panel: Recent Labs     08/19/16 1623  02/02/17 1012 03/10/17 1018 06/09/17 1033  NA  --    < > 139 145 142  K  --    < > 4.6 4.5 4.6  CL  --    < > 104 110 106  CO2  --    < > 28 30 30   GLUCOSE  --    < > 89 91 81  BUN  --    < > 29* 28* 18  CREATININE  --    < > 1.74* 1.48* 1.32*  CALCIUM  --    < >  9.0 9.2 9.2  TSH 3.541  --   --   --   --    < > = values in this interval not displayed.   Liver Function Tests: Recent Labs    08/18/16 1348 08/21/16 1144 02/02/17 1012 06/09/17 1033  AST 29 25 19 19   ALT 22 20 15 9   ALKPHOS 55 56  --   --   BILITOT 0.5 0.5 0.3 0.3  PROT 6.8 6.6 7.0 6.9  ALBUMIN 2.9* 2.3*  --   --    No results for input(s): LIPASE, AMYLASE in the last 8760 hours. No results for input(s): AMMONIA in the last 8760 hours. CBC: Recent Labs    10/22/16 0940 11/21/16 1126 06/09/17 1033  WBC 5.9 6.4 5.9  NEUTROABS 1.6 2.2 1,422*  HGB 11.1* 11.7* 12.2*  HCT 34.6* 35.8* 37.0*  MCV 88.0 87.7 89.2  PLT 150 146 170   Lipid Panel: Recent Labs    06/09/17 1033  CHOL 219*  HDL 69  LDLCALC 135*  TRIG 63  CHOLHDL 3.2   TSH: Recent Labs    08/19/16 1623  TSH 3.541   A1C: Lab Results  Component Value Date   HGBA1C 5.2 04/15/2016     Assessment/Plan 1. Essential hypertension -improved on losartan, will continue hydralazine and coreg - BASIC METABOLIC PANEL WITH GFR  2. CKD (chronic kidney disease), stage III (HCC) - BASIC METABOLIC PANEL WITH GFR  3. COPD, group B, by GOLD 2017 classification ([place]) Stable, has quit smoking with improved breathing.   4. Dementia with behavioral disturbance, unspecified dementia type -overall stable, no acute decline in cognitive or functional status noted. Caregivers coming to check on [name] regularly.   5. Seizures ([place]) -stable, without recent seizures will continue current regimen   6. Loss of weight -recently with weight gain and better eating habits.   Next appt: 12/31/2017 [name] [place], [facility] [phone]

## 2017-08-18 ENCOUNTER — Other Ambulatory Visit: Payer: Self-pay | Admitting: Nurse Practitioner

## 2017-08-21 ENCOUNTER — Other Ambulatory Visit: Payer: Self-pay | Admitting: Nurse Practitioner

## 2017-08-21 DIAGNOSIS — I10 Essential (primary) hypertension: Secondary | ICD-10-CM

## 2017-08-27 ENCOUNTER — Other Ambulatory Visit: Payer: Self-pay | Admitting: Nurse Practitioner

## 2017-12-02 ENCOUNTER — Other Ambulatory Visit: Payer: Self-pay | Admitting: Nurse Practitioner

## 2017-12-03 ENCOUNTER — Other Ambulatory Visit: Payer: Self-pay | Admitting: Nurse Practitioner

## 2017-12-11 ENCOUNTER — Other Ambulatory Visit: Payer: Self-pay | Admitting: Nurse Practitioner

## 2017-12-31 ENCOUNTER — Encounter: Payer: Self-pay | Admitting: Internal Medicine

## 2017-12-31 ENCOUNTER — Ambulatory Visit: Payer: Medicare Other | Admitting: Internal Medicine

## 2017-12-31 VITALS — BP 120/80 | HR 50 | Temp 97.9°F | Ht 68.0 in | Wt 134.0 lb

## 2017-12-31 DIAGNOSIS — N183 Chronic kidney disease, stage 3 unspecified: Secondary | ICD-10-CM

## 2017-12-31 DIAGNOSIS — I10 Essential (primary) hypertension: Secondary | ICD-10-CM

## 2017-12-31 DIAGNOSIS — F0391 Unspecified dementia with behavioral disturbance: Secondary | ICD-10-CM

## 2017-12-31 DIAGNOSIS — I739 Peripheral vascular disease, unspecified: Secondary | ICD-10-CM

## 2017-12-31 DIAGNOSIS — I480 Paroxysmal atrial fibrillation: Secondary | ICD-10-CM

## 2017-12-31 DIAGNOSIS — J449 Chronic obstructive pulmonary disease, unspecified: Secondary | ICD-10-CM

## 2017-12-31 NOTE — Progress Notes (Signed)
Location:  [facility] clinic Provider:  Alonzo Owczarzak L. Mariea Clonts, D.O., C.M.D.  Code Status: DNR Goals of Care:  Advanced Directives 06/09/2017  Does Patient Have a Medical Advance Directive? Yes  Type of Paramedic of [place];Living will  Does patient want to make changes to medical advance directive? No - Patient declined  Copy of [place] in Chart? No - copy requested  Would patient like information on creating a medical advance directive? -     Chief Complaint  Patient presents with  . Medical Management of Chronic Issues    10mth follow-up    HPI: Patient is a 77 y.o. male seen today for medical management of chronic diseases.    One of his feet is bothering him when he's walking.    Memory is in and out.  He remembers his money and stuff.  He does take his medication.  His niece his helping look after him and looking for a job.  CT brain 5/18:  IMPRESSION: 1. No acute abnormality. 2. Unchanged ventriculomegaly, generalized atrophy, right frontal encephalomalacia and findings of chronic microvascular ischemia 3. 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  He was feeling better.  Left foot a problem since he left the hospital.  When he walks he limps.  He had an open wound on the bottom that was getting home health nursing checking it.  Wound is closed up.  He'd been seeing another doctor--saw wound care a year ago. 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  He's had no   He is seeing Dr. Vaughan Browner for his COPD gold B.  He's alright with his breathing.  No shortness of breath on exertion that he reports.    He has three canes, but doesn't use them.      [facility].    BP is good.  He's not dizzy.  Sits on back porch talking to everyone going by.  Has "always talked junk" to people all his life.  He went to visit his friends at "the rest home" where he ws for rehab.   In May his kidney function improved.    Weight down 4 lbs.  He does eat.  He is picky though.  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  He used to eat a ton then.  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    He does have urinary frequency.  Sleeps well at night though.      Office Visit from 12/31/2017 in [facility]  Alcohol Use Disorder Identification Test Final Score (AUDIT)  4      Past Medical History:  Diagnosis Date  . Alcohol abuse   . Anemia   . Aneurysm ([place])   . CKD (chronic kidney disease), stage III ([place])   . COPD (chronic obstructive pulmonary disease) ([place])   . Dysrhythmia   . Fever of unknown origin 08/2016  . History of atrial fibrillation   . Hyperchloremia   . Hypercholesteremia   . Hyperpotassemia   . Hypertension   . Hypertensive renal disease, benign   . Leg edema   . PAD (peripheral artery disease) ([place])   . Pneumonia   . Pressure ulcer of left heel   . Seizures ([place])    has not had a seizure  in 15 yrs  . Tobacco use   . Weight loss     Past Surgical History:  Procedure Laterality Date  . ABDOMINAL AORTOGRAM W/LOWER EXTREMITY N/A 08/13/2016   Procedure: Abdominal Aortogram w/Lower Extremity;  Surgeon: Wellington Hampshire, MD;  Location: [facility];  Service: Cardiovascular;  Laterality: N/A;  . 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, 1996  . EYE SURGERY     December 2017  . PERIPHERAL VASCULAR BALLOON ANGIOPLASTY  08/13/2016   Procedure: Peripheral Vascular Balloon Angioplasty;  Surgeon: Wellington Hampshire, MD;  Location: [facility];  Service: Cardiovascular;;  Aborted    Allergies  Allergen Reactions  . Diltiazem Rash and Other (See Comments)    Blisters  . Penicillins Other (See Comments)    Unknown Childhood allergy  Has patient had a PCN reaction causing immediate rash, facial/tongue/throat swelling, SOB or lightheadedness with hypotension:  unknown Has patient had a PCN reaction causing severe rash involving mucus membranes or skin necrosis: unknown Has patient had a PCN reaction that required hospitalization unknown Has patient had a PCN reaction occurring within the last 10 years:no If all of the above answers are "NO", then may proceed with Cephalosporin use.     Outpatient Encounter Medications as of 12/31/2017  Medication Sig  . aspirin EC 81 MG tablet Take 1 tablet (81 mg total) by mouth daily.  Marland Kitchen atorvastatin (LIPITOR) 20 MG tablet TAKE 1 TABLET BY MOUTH EVERY DAY  . carvedilol (COREG) 3.125 MG tablet Take 1 tablet (3.125 mg total) by mouth 2 (two) times daily with a meal.  . Dextromethorphan-Menthol (DELSYM COUGH RELIEF MT) Use as directed 10 mLs in the mouth or throat daily as needed.  . divalproex (DEPAKOTE) 250 MG DR tablet TAKE ONE TABLET BY MOUTH THREE TIMES DAILY FOR SEIZURES  . hydrALAZINE (APRESOLINE) 50 MG tablet TAKE 1 TABLET BY MOUTH TWICE A DAY  . levETIRAcetam (KEPPRA) 500 MG tablet TAKE 1 TABLET BY MOUTH TWICE A DAY  . losartan (COZAAR) 25 MG tablet Take 1 tablet (25 mg total) by mouth daily.  . memantine (NAMENDA) 10 MG tablet TAKE 1 TABLET BY MOUTH TWICE A DAY  . Multiple Vitamins-Minerals (THEREMS-M) TABS TAKE 1 TABLET BY MOUTH EVERY DAY  . thiamine ([facility] B-1) 100 MG tablet Take 1 tablet (100 mg total) by mouth daily.  . [DISCONTINUED] hydrALAZINE (APRESOLINE) 50 MG tablet Take 1 tablet (50 mg total) by mouth 2 (two) times daily.   No facility-administered encounter medications on file as of 12/31/2017.     Review of Systems:  Review of Systems  Constitutional: Positive for weight loss. Negative for chills and fever.  HENT: Negative for congestion.   Eyes: Negative for blurred vision.  Respiratory: Negative for cough and shortness of breath.   Cardiovascular: Negative for chest pain, palpitations and leg swelling.  Gastrointestinal: Negative for abdominal pain, blood in stool, constipation,  diarrhea and melena.  Genitourinary: Positive for frequency. Negative for dysuria, flank pain, hematuria and urgency.  Musculoskeletal: Negative for falls and joint pain.       Left foot pain causes limp since ulcer in the past, but healed  Skin: Negative for itching and rash.  Neurological: Negative for dizziness and loss of consciousness.  Psychiatric/Behavioral: Positive for memory loss. Negative for depression. The patient is not nervous/anxious and does not have insomnia.     Health Maintenance  Topic Date Due  . INFLUENZA VACCINE  11/05/2017  . TETANUS/TDAP  04/07/2018  . PNA  vac Low Risk Adult  Completed    Physical Exam: Vitals:   12/31/17 1029  BP: 120/80  Pulse: (!) 50  Temp: 97.9 F (36.6 C)  TempSrc: Oral  SpO2: 97%  Weight: 134 lb (60.8 kg)  Height: 5\' 8"  (1.727 m)   Body mass index is 20.37 kg/m. Physical Exam  Constitutional: He is oriented to person, place, and time. No distress.  Cardiovascular: Normal rate and regular rhythm.  Pulmonary/Chest: Effort normal and breath sounds normal. No respiratory distress.  Abdominal: Soft. Bowel sounds are normal. He exhibits no distension. There is no tenderness.  Musculoskeletal: Normal range of motion. He exhibits no tenderness.  No tenderness of left foot at heel  Neurological: He is alert and oriented to person, place, and time.  Skin: Skin is warm and dry. Capillary refill takes less than 2 seconds.  Psychiatric: He has a normal mood and affect.    Labs reviewed: Basic Metabolic Panel: Recent Labs    03/10/17 1018 06/09/17 1033 08/11/17 1018  NA 145 142 142  K 4.5 4.6 4.4  CL 110 106 106  CO2 30 30 29   GLUCOSE 91 81 86  BUN 28* 18 16  CREATININE 1.48* 1.32* 1.27*  CALCIUM 9.2 9.2 8.8   Liver Function Tests: Recent Labs    02/02/17 1012 06/09/17 1033  AST 19 19  ALT 15 9  BILITOT 0.3 0.3  PROT 7.0 6.9   No results for input(s): LIPASE, AMYLASE in the last 8760 hours. No results for  input(s): AMMONIA in the last 8760 hours. CBC: Recent Labs    06/09/17 1033  WBC 5.9  NEUTROABS 1,422*  HGB 12.2*  HCT 37.0*  MCV 89.2  PLT 170   Lipid Panel: Recent Labs    06/09/17 1033  CHOL 219*  HDL 69  LDLCALC 135*  TRIG 63  CHOLHDL 3.2   Lab Results  Component Value Date   HGBA1C 5.2 04/15/2016    Assessment/Plan 1. Dementia with behavioral disturbance, unspecified dementia type -chronic, intake good, but continues to lose weight   2. Essential hypertension, benign -bp at goal, cont same regimen  3. CKD (chronic kidney disease), stage III ([place]) -creatinine improved last check, f/u labs when he sees Jessica  4. COPD, group B, by GOLD 2017 classification ([place]) -asymptomatic by his report, is short of breath long distances per his niece, followed by pulmonary  5. PAD (peripheral artery disease) ([place]) -prior studies, no new symptoms, left foot hurts chronically but no open wounds  6. Paroxysmal atrial fibrillation (HCC) -regular today, cont coreg and asa  Labs/tests ordered: No orders of the defined types were placed in this encounter.  Next appt:  06/11/2018   Arhum Peeples L. Blakelyn Dinges, D.O. [facility] [address], [place] [address] Cell Phone (Mon-Fri 8am-5pm):  [phone] On Call:  [phone] & follow prompts after 5pm & weekends Office Phone:  [phone] Office Fax:  [phone]

## 2018-01-20 ENCOUNTER — Other Ambulatory Visit: Payer: Self-pay | Admitting: Nurse Practitioner

## 2018-01-20 DIAGNOSIS — I10 Essential (primary) hypertension: Secondary | ICD-10-CM

## 2018-02-02 ENCOUNTER — Ambulatory Visit (INDEPENDENT_AMBULATORY_CARE_PROVIDER_SITE_OTHER): Payer: Medicare Other | Admitting: Nurse Practitioner

## 2018-02-02 ENCOUNTER — Encounter: Payer: Self-pay | Admitting: Nurse Practitioner

## 2018-02-02 VITALS — BP 124/74 | HR 76 | Temp 97.9°F | Ht 68.0 in | Wt 130.0 lb

## 2018-02-02 DIAGNOSIS — J209 Acute bronchitis, unspecified: Secondary | ICD-10-CM

## 2018-02-02 DIAGNOSIS — J440 Chronic obstructive pulmonary disease with acute lower respiratory infection: Secondary | ICD-10-CM | POA: Diagnosis not present

## 2018-02-02 MED ORDER — DOXYCYCLINE HYCLATE 100 MG PO TABS: 100.0000 mg | ORAL_TABLET | Freq: Two times a day (BID) | ORAL | 0 refills | Status: DC

## 2018-02-02 MED ORDER — PREDNISONE 10 MG (21) PO TBPK: ORAL_TABLET | ORAL | 0 refills | Status: DC

## 2018-02-02 NOTE — Progress Notes (Signed)
Careteam: Patient Care Team: [name], NP as PCP - General (Nurse Practitioner)  Advanced Directive information Does Patient Have a Medical Advance Directive?: No  Allergies  Allergen Reactions  . Diltiazem Rash and Other (See Comments)    Blisters  . Penicillins Other (See Comments)    Unknown Childhood allergy  Has patient had a PCN reaction causing immediate rash, facial/tongue/throat swelling, SOB or lightheadedness with hypotension: unknown Has patient had a PCN reaction causing severe rash involving mucus membranes or skin necrosis: unknown Has patient had a PCN reaction that required hospitalization unknown Has patient had a PCN reaction occurring within the last 10 years:no If all of the above answers are "NO", then may proceed with Cephalosporin use.     Chief Complaint  Patient presents with  . Acute Visit    Pt is being seen due to cough that started around 5 days ago. Pt did have some nasal congestion prior to cough.      HPI: Patient is a 77 y.o. male seen in the office today due to deep cough.  Has been going on for 5 days. Niece with him today and said she gave him some delsym which has helped. Does not feel sick but cough sounded so bad family wanted him looked at.  "got it from yall worrying me"  No sick contacts.   no fevers, no shortness of breath.  Current smoker.   Review of Systems:  Review of Systems  Constitutional: Negative for chills and fever.  HENT: Negative for congestion, hearing loss and sore throat.   Respiratory: Positive for cough. Negative for shortness of breath.   Cardiovascular: Negative for chest pain.  Psychiatric/Behavioral: Positive for memory loss.    Past Medical History:  Diagnosis Date  . Alcohol abuse   . Anemia   . Aneurysm ([place])   . CKD (chronic kidney disease), stage III ([place])   . COPD (chronic obstructive pulmonary disease) ([place])   . Dysrhythmia   . Fever of unknown origin 08/2016  . History of  atrial fibrillation   . Hyperchloremia   . Hypercholesteremia   . Hyperpotassemia   . Hypertension   . Hypertensive renal disease, benign   . Leg edema   . PAD (peripheral artery disease) ([place])   . Pneumonia   . Pressure ulcer of left heel   . Seizures ([place])    has not had a seizure in 15 yrs  . Tobacco use   . Weight loss    Past Surgical History:  Procedure Laterality Date  . ABDOMINAL AORTOGRAM W/LOWER EXTREMITY N/A 08/13/2016   Procedure: Abdominal Aortogram w/Lower Extremity;  Surgeon: Wellington Hampshire, MD;  Location: [facility];  Service: Cardiovascular;  Laterality: N/A;  . 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, 1996  . EYE SURGERY     December 2017  . PERIPHERAL VASCULAR BALLOON ANGIOPLASTY  08/13/2016   Procedure: Peripheral Vascular Balloon Angioplasty;  Surgeon: Wellington Hampshire, MD;  Location: [facility];  Service: Cardiovascular;;  Aborted   Social History:   reports that he quit smoking about 21 months ago. His smoking use included cigarettes. He has a 14.00 pack-year smoking history. He has never used smokeless tobacco. He reports that he does not drink alcohol or use drugs.  Family History  Problem Relation Age of Onset  . Cancer Mother   . Congestive Heart Failure Father   . Alzheimer's disease Sister   . Congestive Heart Failure Sister   .  Clotting disorder Sister   . Congestive Heart Failure Brother   . Clotting disorder Sister     Medications: Patient's Medications  New Prescriptions   No medications on file  Previous Medications   ASPIRIN EC 81 MG TABLET    Take 1 tablet (81 mg total) by mouth daily.   ATORVASTATIN (LIPITOR) 20 MG TABLET    TAKE 1 TABLET BY MOUTH EVERY DAY   CARVEDILOL (COREG) 3.125 MG TABLET    Take 1 tablet (3.125 mg total) by mouth 2 (two) times daily with a meal.   DEXTROMETHORPHAN-MENTHOL (DELSYM COUGH RELIEF MT)    Use as directed 10 mLs in the mouth or throat daily as needed.   DIVALPROEX (DEPAKOTE) 250 MG DR TABLET     TAKE ONE TABLET BY MOUTH THREE TIMES DAILY FOR SEIZURES   HYDRALAZINE (APRESOLINE) 50 MG TABLET    TAKE 1 TABLET BY MOUTH TWICE A DAY   LEVETIRACETAM (KEPPRA) 500 MG TABLET    TAKE 1 TABLET BY MOUTH TWICE A DAY   LOSARTAN (COZAAR) 25 MG TABLET    TAKE 1 TABLET BY MOUTH EVERY DAY   MEMANTINE (NAMENDA) 10 MG TABLET    TAKE 1 TABLET BY MOUTH TWICE A DAY   MULTIPLE VITAMINS-MINERALS (THEREMS-M) TABS    TAKE 1 TABLET BY MOUTH EVERY DAY   POTASSIUM PO    Take 1 tablet by mouth daily.   THIAMINE ([facility] B-1) 100 MG TABLET    Take 1 tablet (100 mg total) by mouth daily.  Modified Medications   No medications on file  Discontinued Medications   No medications on file     Physical Exam:  Vitals:   02/02/18 1518  BP: 124/74  Pulse: 76  Temp: 97.9 F (36.6 C)  TempSrc: Oral  SpO2: 98%  Weight: 130 lb (59 kg)  Height: 5\' 8"  (1.727 m)   Body mass index is 19.77 kg/m.  Physical Exam  Constitutional: He is oriented to person, place, and time. No distress.  Cardiovascular: Normal rate and regular rhythm.  Pulmonary/Chest: Effort normal. No respiratory distress.  Diminished breath sounds throughout   Neurological: He is alert and oriented to person, place, and time.  Skin: Skin is warm and dry. Capillary refill takes less than 2 seconds.  Psychiatric: He has a normal mood and affect.    Labs reviewed: Basic Metabolic Panel: Recent Labs    03/10/17 1018 06/09/17 1033 08/11/17 1018  NA 145 142 142  K 4.5 4.6 4.4  CL 110 106 106  CO2 30 30 29   GLUCOSE 91 81 86  BUN 28* 18 16  CREATININE 1.48* 1.32* 1.27*  CALCIUM 9.2 9.2 8.8   Liver Function Tests: Recent Labs    06/09/17 1033  AST 19  ALT 9  BILITOT 0.3  PROT 6.9   No results for input(s): LIPASE, AMYLASE in the last 8760 hours. No results for input(s): AMMONIA in the last 8760 hours. CBC: Recent Labs    06/09/17 1033  WBC 5.9  NEUTROABS 1,422*  HGB 12.2*  HCT 37.0*  MCV 89.2  PLT 170   Lipid Panel: Recent  Labs    06/09/17 1033  CHOL 219*  HDL 69  LDLCALC 135*  TRIG 63  CHOLHDL 3.2   TSH: No results for input(s): TSH in the last 8760 hours. A1C: Lab Results  Component Value Date   HGBA1C 5.2 04/15/2016     Assessment/Plan 1. Acute bronchitis with COPD ([place]) -mucinex DM by mouth twice daily  for 7 days -increase water intake . - doxycycline (VIBRA-TABS) 100 MG tablet; Take 1 tablet (100 mg total) by mouth 2 (two) times daily.  Dispense: 14 tablet; Refill: 0 - predniSONE (STERAPRED UNI-PAK 21 TAB) 10 MG (21) TBPK tablet; Use as directed  Dispense: 21 tablet; Refill: 0  Next appt:  Rhayne Chatwin K. [place], [facility] [phone]

## 2018-02-02 NOTE — Patient Instructions (Signed)
Drink plenty of fluids mucinex DM by mouth twice daily with full glass of water Doxycycline 100 mg by mouth twice daily Prednisone dose pack as directed   Acute Bronchitis, Adult Acute bronchitis is when air tubes (bronchi) in the lungs suddenly get swollen. The condition can make it hard to breathe. It can also cause these symptoms:  A cough.  Coughing up clear, yellow, or green mucus.  Wheezing.  Chest congestion.  Shortness of breath.  A fever.  Body aches.  Chills.  A sore throat.  Follow these instructions at home: Medicines  Take over-the-counter and prescription medicines only as told by your doctor.  If you were prescribed an antibiotic medicine, take it as told by your doctor. Do not stop taking the antibiotic even if you start to feel better. General instructions  Rest.  Drink enough fluids to keep your pee (urine) clear or pale yellow.  Avoid smoking and secondhand smoke. If you smoke and you need help quitting, ask your doctor. Quitting will help your lungs heal faster.  Use an inhaler, cool mist vaporizer, or humidifier as told by your doctor.  Keep all follow-up visits as told by your doctor. This is important. How is this prevented? To lower your risk of getting this condition again:  Wash your hands often with soap and water. If you cannot use soap and water, use hand sanitizer.  Avoid contact with people who have cold symptoms.  Try not to touch your hands to your mouth, nose, or eyes.  Make sure to get the flu shot every year.  Contact a doctor if:  Your symptoms do not get better in 2 weeks. Get help right away if:  You cough up blood.  You have chest pain.  You have very bad shortness of breath.  You become dehydrated.  You faint (pass out) or keep feeling like you are going to pass out.  You keep throwing up (vomiting).  You have a very bad headache.  Your fever or chills gets worse. This information is not intended to  replace advice given to you by your health care provider. Make sure you discuss any questions you have with your health care provider. Document Released: 09/10/2007 Document Revised: 10/31/2015 Document Reviewed: 09/12/2015 Elsevier Interactive Patient Education  [employer].

## 2018-02-07 ENCOUNTER — Other Ambulatory Visit: Payer: Self-pay | Admitting: Nurse Practitioner

## 2018-03-08 ENCOUNTER — Other Ambulatory Visit: Payer: Self-pay | Admitting: Nurse Practitioner

## 2018-03-08 DIAGNOSIS — I10 Essential (primary) hypertension: Secondary | ICD-10-CM

## 2018-04-28 ENCOUNTER — Other Ambulatory Visit: Payer: Self-pay

## 2018-04-28 ENCOUNTER — Inpatient Hospital Stay (HOSPITAL_COMMUNITY): Payer: Medicare Other

## 2018-04-28 ENCOUNTER — Inpatient Hospital Stay (HOSPITAL_COMMUNITY)
Admission: EM | Admit: 2018-04-28 | Discharge: 2018-05-02 | DRG: 190 | Disposition: A | Discharge status: Home Health | Payer: Medicare Other | Attending: Internal Medicine | Admitting: Internal Medicine

## 2018-04-28 ENCOUNTER — Emergency Department (HOSPITAL_COMMUNITY): Payer: Medicare Other

## 2018-04-28 ENCOUNTER — Encounter (HOSPITAL_COMMUNITY): Payer: Self-pay

## 2018-04-28 DIAGNOSIS — Z880 Allergy status to penicillin: Secondary | ICD-10-CM

## 2018-04-28 DIAGNOSIS — E7800 Pure hypercholesterolemia, unspecified: Secondary | ICD-10-CM | POA: Diagnosis present

## 2018-04-28 DIAGNOSIS — J9602 Acute respiratory failure with hypercapnia: Secondary | ICD-10-CM | POA: Diagnosis present

## 2018-04-28 DIAGNOSIS — F17210 Nicotine dependence, cigarettes, uncomplicated: Secondary | ICD-10-CM | POA: Diagnosis present

## 2018-04-28 DIAGNOSIS — R0902 Hypoxemia: Secondary | ICD-10-CM

## 2018-04-28 DIAGNOSIS — E875 Hyperkalemia: Secondary | ICD-10-CM | POA: Diagnosis present

## 2018-04-28 DIAGNOSIS — I739 Peripheral vascular disease, unspecified: Secondary | ICD-10-CM | POA: Diagnosis present

## 2018-04-28 DIAGNOSIS — J9601 Acute respiratory failure with hypoxia: Secondary | ICD-10-CM | POA: Diagnosis present

## 2018-04-28 DIAGNOSIS — Z832 Family history of diseases of the blood and blood-forming organs and certain disorders involving the immune mechanism: Secondary | ICD-10-CM

## 2018-04-28 DIAGNOSIS — Z7952 Long term (current) use of systemic steroids: Secondary | ICD-10-CM | POA: Diagnosis not present

## 2018-04-28 DIAGNOSIS — F1010 Alcohol abuse, uncomplicated: Secondary | ICD-10-CM | POA: Diagnosis present

## 2018-04-28 DIAGNOSIS — D539 Nutritional anemia, unspecified: Secondary | ICD-10-CM | POA: Diagnosis present

## 2018-04-28 DIAGNOSIS — E785 Hyperlipidemia, unspecified: Secondary | ICD-10-CM | POA: Diagnosis present

## 2018-04-28 DIAGNOSIS — Z681 Body mass index (BMI) 19 or less, adult: Secondary | ICD-10-CM | POA: Diagnosis not present

## 2018-04-28 DIAGNOSIS — Z720 Tobacco use: Secondary | ICD-10-CM

## 2018-04-28 DIAGNOSIS — D469 Myelodysplastic syndrome, unspecified: Secondary | ICD-10-CM | POA: Diagnosis present

## 2018-04-28 DIAGNOSIS — Z809 Family history of malignant neoplasm, unspecified: Secondary | ICD-10-CM

## 2018-04-28 DIAGNOSIS — Z888 Allergy status to other drugs, medicaments and biological substances status: Secondary | ICD-10-CM

## 2018-04-28 DIAGNOSIS — I5032 Chronic diastolic (congestive) heart failure: Secondary | ICD-10-CM | POA: Diagnosis present

## 2018-04-28 DIAGNOSIS — E440 Moderate protein-calorie malnutrition: Secondary | ICD-10-CM | POA: Diagnosis present

## 2018-04-28 DIAGNOSIS — J9692 Respiratory failure, unspecified with hypercapnia: Secondary | ICD-10-CM

## 2018-04-28 DIAGNOSIS — J449 Chronic obstructive pulmonary disease, unspecified: Secondary | ICD-10-CM | POA: Diagnosis not present

## 2018-04-28 DIAGNOSIS — I4891 Unspecified atrial fibrillation: Secondary | ICD-10-CM | POA: Diagnosis present

## 2018-04-28 DIAGNOSIS — J441 Chronic obstructive pulmonary disease with (acute) exacerbation: Principal | ICD-10-CM | POA: Diagnosis present

## 2018-04-28 DIAGNOSIS — I10 Essential (primary) hypertension: Secondary | ICD-10-CM | POA: Diagnosis not present

## 2018-04-28 DIAGNOSIS — Z7982 Long term (current) use of aspirin: Secondary | ICD-10-CM

## 2018-04-28 DIAGNOSIS — N179 Acute kidney failure, unspecified: Secondary | ICD-10-CM | POA: Diagnosis not present

## 2018-04-28 DIAGNOSIS — D696 Thrombocytopenia, unspecified: Secondary | ICD-10-CM | POA: Diagnosis present

## 2018-04-28 DIAGNOSIS — B9781 Human metapneumovirus as the cause of diseases classified elsewhere: Secondary | ICD-10-CM | POA: Diagnosis present

## 2018-04-28 DIAGNOSIS — M79609 Pain in unspecified limb: Secondary | ICD-10-CM | POA: Diagnosis not present

## 2018-04-28 DIAGNOSIS — J9691 Respiratory failure, unspecified with hypoxia: Secondary | ICD-10-CM | POA: Diagnosis present

## 2018-04-28 DIAGNOSIS — N183 Chronic kidney disease, stage 3 (moderate): Secondary | ICD-10-CM | POA: Diagnosis present

## 2018-04-28 DIAGNOSIS — G40909 Epilepsy, unspecified, not intractable, without status epilepticus: Secondary | ICD-10-CM | POA: Diagnosis present

## 2018-04-28 DIAGNOSIS — Z8249 Family history of ischemic heart disease and other diseases of the circulatory system: Secondary | ICD-10-CM

## 2018-04-28 DIAGNOSIS — I130 Hypertensive heart and chronic kidney disease with heart failure and stage 1 through stage 4 chronic kidney disease, or unspecified chronic kidney disease: Secondary | ICD-10-CM | POA: Diagnosis present

## 2018-04-28 DIAGNOSIS — Z8679 Personal history of other diseases of the circulatory system: Secondary | ICD-10-CM

## 2018-04-28 DIAGNOSIS — R569 Unspecified convulsions: Secondary | ICD-10-CM | POA: Diagnosis not present

## 2018-04-28 DIAGNOSIS — F0390 Unspecified dementia without behavioral disturbance: Secondary | ICD-10-CM | POA: Diagnosis present

## 2018-04-28 DIAGNOSIS — Z820 Family history of epilepsy and other diseases of the nervous system: Secondary | ICD-10-CM

## 2018-04-28 DIAGNOSIS — Z79899 Other long term (current) drug therapy: Secondary | ICD-10-CM

## 2018-04-28 LAB — CBC WITH DIFFERENTIAL/PLATELET
ABS IMMATURE GRANULOCYTES: 0.02 10*3/uL (ref 0.00–0.07)
BASOS ABS: 0 10*3/uL (ref 0.0–0.1)
Basophils Relative: 1 %
EOS PCT: 0 %
Eosinophils Absolute: 0 10*3/uL (ref 0.0–0.5)
HCT: 42.9 % (ref 39.0–52.0)
HEMOGLOBIN: 13 g/dL (ref 13.0–17.0)
Immature Granulocytes: 0 %
LYMPHS ABS: 1.7 10*3/uL (ref 0.7–4.0)
LYMPHS PCT: 31 %
MCH: 31.1 pg (ref 26.0–34.0)
MCHC: 30.3 g/dL (ref 30.0–36.0)
MCV: 102.6 fL — ABNORMAL HIGH (ref 80.0–100.0)
Monocytes Absolute: 0.6 10*3/uL (ref 0.1–1.0)
Monocytes Relative: 10 %
NRBC: 0 % (ref 0.0–0.2)
Neutro Abs: 3.1 10*3/uL (ref 1.7–7.7)
Neutrophils Relative %: 58 %
Platelets: 100 10*3/uL — ABNORMAL LOW (ref 150–400)
RBC: 4.18 MIL/uL — ABNORMAL LOW (ref 4.22–5.81)
RDW: 15.2 % (ref 11.5–15.5)
WBC: 5.4 10*3/uL (ref 4.0–10.5)

## 2018-04-28 LAB — ETHANOL: Alcohol, Ethyl (B): <10 mg/dL (ref <10)

## 2018-04-28 LAB — BLOOD GAS, ARTERIAL
Acid-base deficit: 2.8 mmol/L — ABNORMAL HIGH (ref 0.0–2.0)
Acid-base deficit: 3.6 mmol/L — ABNORMAL HIGH (ref 0.0–2.0)
Bicarbonate: 27.6 mmol/L (ref 20.0–28.0)
Bicarbonate: 23.5 mmol/L (ref 20.0–28.0)
DELIVERY SYSTEMS: POSITIVE
DRAWN BY: 270211
Drawn by: 11249
Expiratory PAP: 6
FIO2: 30
Inspiratory PAP: 18
MODE: POSITIVE
O2 CONTENT: 2 L/min
O2 Saturation: 88.2 %
O2 Saturation: 94.5 %
PH ART: 7.17 — AB (ref 7.350–7.450)
Patient temperature: 98.6
Patient temperature: 98.3
pCO2 arterial: 78.9 mmHg (ref 32.0–48.0)
pCO2 arterial: 53.7 mmHg — ABNORMAL HIGH (ref 32.0–48.0)
pH, Arterial: 7.263 — ABNORMAL LOW (ref 7.350–7.450)
pO2, Arterial: 69.9 mmHg — ABNORMAL LOW (ref 83.0–108.0)
pO2, Arterial: 82 mmHg — ABNORMAL LOW (ref 83.0–108.0)

## 2018-04-28 LAB — BLOOD GAS, VENOUS
ACID-BASE DEFICIT: 3.3 mmol/L — AB (ref 0.0–2.0)
BICARBONATE: 25.1 mmol/L (ref 20.0–28.0)
Delivery systems: POSITIVE
FIO2: 30
Mode: POSITIVE
O2 SAT: 62.8 %
Patient temperature: 98.6
pCO2, Ven: 63.1 mmHg — ABNORMAL HIGH (ref 44.0–60.0)
pH, Ven: 7.223 — ABNORMAL LOW (ref 7.250–7.430)
pO2, Ven: 39.1 mmHg (ref 32.0–45.0)

## 2018-04-28 LAB — COMPREHENSIVE METABOLIC PANEL
ALBUMIN: 4.2 g/dL (ref 3.5–5.0)
ALT: 18 U/L (ref 0–44)
AST: 32 U/L (ref 15–41)
Alkaline Phosphatase: 72 U/L (ref 38–126)
Anion gap: 9 (ref 5–15)
BUN: 23 mg/dL (ref 8–23)
CHLORIDE: 107 mmol/L (ref 98–111)
CO2: 26 mmol/L (ref 22–32)
CREATININE: 1.32 mg/dL — AB (ref 0.61–1.24)
Calcium: 8.8 mg/dL — ABNORMAL LOW (ref 8.9–10.3)
GFR calc non Af Amer: 51 mL/min — ABNORMAL LOW (ref >60)
GFR, EST AFRICAN AMERICAN: 59 mL/min — AB (ref >60)
GLUCOSE: 129 mg/dL — AB (ref 70–99)
Potassium: 5.3 mmol/L — ABNORMAL HIGH (ref 3.5–5.1)
SODIUM: 142 mmol/L (ref 135–145)
Total Bilirubin: 0.4 mg/dL (ref 0.3–1.2)
Total Protein: 8.1 g/dL (ref 6.5–8.1)

## 2018-04-28 LAB — PROTIME-INR
INR: 0.94
Prothrombin Time: 12.5 seconds (ref 11.4–15.2)

## 2018-04-28 LAB — TROPONIN I: Troponin I: 0.04 ng/mL (ref <0.03)

## 2018-04-28 MED ORDER — SODIUM CHLORIDE 0.9 % IV BOLUS
500.0000 mL | Freq: Once | INTRAVENOUS | Status: AC
  Administered 2018-04-28: 500 mL via INTRAVENOUS

## 2018-04-28 MED ORDER — MEMANTINE HCL 10 MG PO TABS
10.0000 mg | ORAL_TABLET | Freq: Two times a day (BID) | ORAL | Status: DC
  Administered 2018-04-28 – 2018-05-02 (×8): 10 mg via ORAL
  Filled 2018-04-28 (×2): qty 2
  Filled 2018-04-28 (×3): qty 1
  Filled 2018-04-28: qty 2
  Filled 2018-04-28: qty 1
  Filled 2018-04-28: qty 2

## 2018-04-28 MED ORDER — SODIUM CHLORIDE 0.9 % IV SOLN
INTRAVENOUS | Status: AC
  Administered 2018-04-29: 05:00:00 via INTRAVENOUS

## 2018-04-28 MED ORDER — IPRATROPIUM-ALBUTEROL 0.5-2.5 (3) MG/3ML IN SOLN
RESPIRATORY_TRACT | Status: AC
  Filled 2018-04-28: qty 3

## 2018-04-28 MED ORDER — METHYLPREDNISOLONE SODIUM SUCC 125 MG IJ SOLR
125.0000 mg | Freq: Once | INTRAMUSCULAR | Status: AC
  Administered 2018-04-28: 125 mg via INTRAVENOUS
  Filled 2018-04-28: qty 2

## 2018-04-28 MED ORDER — LEVALBUTEROL HCL 0.63 MG/3ML IN NEBU
0.6300 mg | INHALATION_SOLUTION | Freq: Four times a day (QID) | RESPIRATORY_TRACT | Status: DC | PRN
  Administered 2018-04-29: 0.63 mg via RESPIRATORY_TRACT
  Filled 2018-04-28: qty 3

## 2018-04-28 MED ORDER — PREDNISONE 20 MG PO TABS
40.0000 mg | ORAL_TABLET | Freq: Every day | ORAL | Status: DC
  Administered 2018-04-29: 40 mg via ORAL
  Filled 2018-04-28: qty 2

## 2018-04-28 MED ORDER — ONDANSETRON HCL 4 MG PO TABS: 4.0000 mg | ORAL_TABLET | Freq: Four times a day (QID) | ORAL | Status: DC | PRN

## 2018-04-28 MED ORDER — CHLORHEXIDINE GLUCONATE 0.12 % MT SOLN
15.0000 mL | Freq: Two times a day (BID) | OROMUCOSAL | Status: DC
  Administered 2018-04-29 – 2018-05-02 (×7): 15 mL via OROMUCOSAL
  Filled 2018-04-28 (×8): qty 15

## 2018-04-28 MED ORDER — HYDRALAZINE HCL 50 MG PO TABS
50.0000 mg | ORAL_TABLET | Freq: Two times a day (BID) | ORAL | Status: DC
  Administered 2018-04-28 – 2018-04-30 (×4): 50 mg via ORAL
  Filled 2018-04-28 (×4): qty 1

## 2018-04-28 MED ORDER — ACETAMINOPHEN 650 MG RE SUPP: 650.0000 mg | Freq: Four times a day (QID) | RECTAL | Status: DC | PRN

## 2018-04-28 MED ORDER — ALBUTEROL (5 MG/ML) CONTINUOUS INHALATION SOLN
10.0000 mg/h | INHALATION_SOLUTION | RESPIRATORY_TRACT | Status: DC
  Filled 2018-04-28: qty 20

## 2018-04-28 MED ORDER — THIAMINE HCL 100 MG/ML IJ SOLN
100.0000 mg | Freq: Every day | INTRAMUSCULAR | Status: DC
  Administered 2018-04-28 – 2018-04-29 (×2): 100 mg via INTRAVENOUS
  Filled 2018-04-28 (×2): qty 2

## 2018-04-28 MED ORDER — IPRATROPIUM-ALBUTEROL 0.5-2.5 (3) MG/3ML IN SOLN
3.0000 mL | Freq: Four times a day (QID) | RESPIRATORY_TRACT | Status: DC
  Administered 2018-04-28: 3 mL via RESPIRATORY_TRACT

## 2018-04-28 MED ORDER — IPRATROPIUM-ALBUTEROL 0.5-2.5 (3) MG/3ML IN SOLN: 3.0000 mL | Freq: Three times a day (TID) | RESPIRATORY_TRACT | Status: DC

## 2018-04-28 MED ORDER — SODIUM CHLORIDE 0.9 % IV SOLN
INTRAVENOUS | Status: DC
Start: 2018-04-28 — End: 2018-04-29
  Administered 2018-04-28: 17:00:00 via INTRAVENOUS

## 2018-04-28 MED ORDER — LEVETIRACETAM 500 MG PO TABS
500.0000 mg | ORAL_TABLET | Freq: Two times a day (BID) | ORAL | Status: DC
  Administered 2018-04-28 – 2018-05-02 (×8): 500 mg via ORAL
  Filled 2018-04-28 (×8): qty 1

## 2018-04-28 MED ORDER — GUAIFENESIN ER 600 MG PO TB12
600.0000 mg | ORAL_TABLET | Freq: Two times a day (BID) | ORAL | Status: DC
  Administered 2018-04-28 – 2018-05-02 (×8): 600 mg via ORAL
  Filled 2018-04-28 (×8): qty 1

## 2018-04-28 MED ORDER — ORAL CARE MOUTH RINSE
15.0000 mL | Freq: Two times a day (BID) | OROMUCOSAL | Status: DC
  Administered 2018-04-29 – 2018-04-30 (×2): 15 mL via OROMUCOSAL

## 2018-04-28 MED ORDER — DIVALPROEX SODIUM 250 MG PO DR TAB
250.0000 mg | DELAYED_RELEASE_TABLET | Freq: Three times a day (TID) | ORAL | Status: DC
  Administered 2018-04-28 – 2018-05-02 (×11): 250 mg via ORAL
  Filled 2018-04-28 (×11): qty 1

## 2018-04-28 MED ORDER — ATORVASTATIN CALCIUM 20 MG PO TABS
20.0000 mg | ORAL_TABLET | Freq: Every day | ORAL | Status: DC
  Administered 2018-04-29 – 2018-05-02 (×4): 20 mg via ORAL
  Filled 2018-04-28: qty 1
  Filled 2018-04-28: qty 2
  Filled 2018-04-28: qty 1
  Filled 2018-04-28: qty 2

## 2018-04-28 MED ORDER — DOXYCYCLINE HYCLATE 100 MG PO TABS
100.0000 mg | ORAL_TABLET | Freq: Two times a day (BID) | ORAL | Status: DC
  Administered 2018-04-28 – 2018-05-02 (×8): 100 mg via ORAL
  Filled 2018-04-28 (×8): qty 1

## 2018-04-28 MED ORDER — ONDANSETRON HCL 4 MG/2ML IJ SOLN: 4.0000 mg | Freq: Four times a day (QID) | INTRAMUSCULAR | Status: DC | PRN

## 2018-04-28 MED ORDER — ACETAMINOPHEN 325 MG PO TABS: 650.0000 mg | ORAL_TABLET | Freq: Four times a day (QID) | ORAL | Status: DC | PRN

## 2018-04-28 NOTE — ED Notes (Signed)
ED TO INPATIENT HANDOFF REPORT  Name/Age/Gender [name] 78 y.o. male  Code Status Code Status History    Date Active Date Inactive Code Status Order ID Comments User Context   08/18/2016 1705 08/23/2016 2012 DNR [id number]  Geradine Girt, DO ED   08/13/2016 1355 08/13/2016 2237 Full Code [id number]  Wellington Hampshire, MD Inpatient   04/14/2016 2052 04/19/2016 1650 Full Code [id number]  Ivor Costa, MD ED    Questions for Most Recent Historical Code Status (Order [id number])    Question Answer Comment   In the event of cardiac or respiratory ARREST Do not call a "code blue"    In the event of cardiac or respiratory ARREST Do not perform Intubation, CPR, defibrillation or ACLS    In the event of cardiac or respiratory ARREST Use medication by any route, position, wound care, and other measures to relive pain and suffering. May use oxygen, suction and manual treatment of airway obstruction as needed for comfort.         Advance Directive Documentation     Most Recent Value  Type of Advance Directive  Living will, Healthcare Power of Attorney  Pre-existing out of facility DNR order (yellow form or pink MOST form)  -  "MOST" Form in Place?  -      Home/SNF/Other Home  Chief Complaint cough   Level of Care/Admitting Diagnosis ED Disposition    ED Disposition Condition [place]: [place] [[id number]]  Level of Care: Stepdown [14]  Admit to SDU based on following criteria: Respiratory Distress:  Frequent assessment and/or intervention to maintain adequate ventilation/respiration, pulmonary toilet, and respiratory treatment.  Diagnosis: COPD exacerbation [facility]) [[id number]]  Admitting Physician: Toy Baker [[id number]]  Attending Physician: Toy Baker [[id number]]  Estimated length of stay: 3 - 4 days  Certification:: I certify this patient will need inpatient services for at least 2 midnights  PT Class (Do Not Modify): Inpatient [101]  PT Acc  Code (Do Not Modify): Private [1]       Medical History Past Medical History:  Diagnosis Date  . Alcohol abuse   . Anemia   . Aneurysm ([place])   . CKD (chronic kidney disease), stage III ([place])   . COPD (chronic obstructive pulmonary disease) ([place])   . Dysrhythmia   . Fever of unknown origin 08/2016  . History of atrial fibrillation   . Hyperchloremia   . Hypercholesteremia   . Hyperpotassemia   . Hypertension   . Hypertensive renal disease, benign   . Leg edema   . PAD (peripheral artery disease) ([place])   . Pneumonia   . Pressure ulcer of left heel   . Seizures ([place])    has not had a seizure in 15 yrs  . Tobacco use   . Weight loss     Allergies Allergies  Allergen Reactions  . Diltiazem Rash and Other (See Comments)    Blisters  . Penicillins Other (See Comments)    Unknown Childhood allergy  Has patient had a PCN reaction causing immediate rash, facial/tongue/throat swelling, SOB or lightheadedness with hypotension: unknown Has patient had a PCN reaction causing severe rash involving mucus membranes or skin necrosis: unknown Has patient had a PCN reaction that required hospitalization unknown Has patient had a PCN reaction occurring within the last 10 years:no If all of the above answers are "NO", then may proceed with Cephalosporin use.     IV Location/Drains/Wounds Patient Lines/Drains/Airways Status  Active Line/Drains/Airways    Name:   Placement date:   Placement time:   Site:   Days:   Peripheral IV 09/18/16 Right Forearm   09/18/16    0927    Forearm   587   Peripheral IV 04/28/18 Left Forearm   04/28/18    2031    Forearm   less than 1   Pressure Injury 08/19/16 Deep Tissue Injury - Purple or maroon localized area of discolored intact skin or blood-filled blister due to damage of underlying soft tissue from pressure and/or shear. left plantar foot with deep tissue injury.  Left heel with   08/19/16    1043     617          Labs/Imaging Results for  orders placed or performed during the hospital encounter of 04/28/18 (from the past 48 hour(s))  Comprehensive metabolic panel     Status: Abnormal   Collection Time: 04/28/18  3:49 PM  Result Value Ref Range   Sodium 142 135 - 145 mmol/L   Potassium 5.3 (H) 3.5 - 5.1 mmol/L   Chloride 107 98 - 111 mmol/L   CO2 26 22 - 32 mmol/L   Glucose, Bld 129 (H) 70 - 99 mg/dL   BUN 23 8 - 23 mg/dL   Creatinine, Ser 1.32 (H) 0.61 - 1.24 mg/dL   Calcium 8.8 (L) 8.9 - 10.3 mg/dL   Total Protein 8.1 6.5 - 8.1 g/dL   Albumin 4.2 3.5 - 5.0 g/dL   AST 32 15 - 41 U/L   ALT 18 0 - 44 U/L   Alkaline Phosphatase 72 38 - 126 U/L   Total Bilirubin 0.4 0.3 - 1.2 mg/dL   GFR calc non Af Amer 51 (L) >60 mL/min   GFR calc Af Amer 59 (L) >60 mL/min   Anion gap 9 5 - 15    Comment: Performed at [facility], [place] [address] [place], [place] [address]  CBC with Differential     Status: Abnormal   Collection Time: 04/28/18  3:49 PM  Result Value Ref Range   WBC 5.4 4.0 - 10.5 K/uL   RBC 4.18 (L) 4.22 - 5.81 MIL/uL   Hemoglobin 13.0 13.0 - 17.0 g/dL   HCT 42.9 39.0 - 52.0 %   MCV 102.6 (H) 80.0 - 100.0 fL   MCH 31.1 26.0 - 34.0 pg   MCHC 30.3 30.0 - 36.0 g/dL   RDW 15.2 11.5 - 15.5 %   Platelets 100 (L) 150 - 400 K/uL    Comment: REPEATED TO VERIFY PLATELET COUNT CONFIRMED BY SMEAR SPECIMEN CHECKED FOR CLOTS Immature Platelet Fraction may be clinically indicated, consider ordering this additional test [id number]    nRBC 0.0 0.0 - 0.2 %   Neutrophils Relative % 58 %   Neutro Abs 3.1 1.7 - 7.7 K/uL   Lymphocytes Relative 31 %   Lymphs Abs 1.7 0.7 - 4.0 K/uL   Monocytes Relative 10 %   Monocytes Absolute 0.6 0.1 - 1.0 K/uL   Eosinophils Relative 0 %   Eosinophils Absolute 0.0 0.0 - 0.5 K/uL   Basophils Relative 1 %   Basophils Absolute 0.0 0.0 - 0.1 K/uL   WBC Morphology TOXIC GRANULATION     Comment: VACUOLATED NEUTROPHILS   Immature Granulocytes 0 %   Abs Immature Granulocytes  0.02 0.00 - 0.07 K/uL    Comment: Performed at [facility], [place] [address] [place], [place] [address]  Troponin I -  Once     Status: None   Collection Time: 04/28/18  3:49 PM  Result Value Ref Range   Troponin I <0.03 <0.03 ng/mL    Comment: Performed at [facility], [place] [address], [place], [place] [address]  Blood gas, arterial     Status: Abnormal   Collection Time: 04/28/18  4:09 PM  Result Value Ref Range   O2 Content 2.0 L/min   pH, Arterial 7.170 (LL) 7.350 - 7.450    Comment: CRITICAL RESULT CALLED TO, READ BACK BY AND VERIFIED WITH: dr Eulis Foster by Eben Burow rrt rcp on 04/28/18 at 1612    pCO2 arterial 78.9 (HH) 32.0 - 48.0 mmHg    Comment: CRITICAL RESULT CALLED TO, READ BACK BY AND VERIFIED WITH: dr Eulis Foster by Eben Burow rrt rcp on 04/28/18 at 1612    pO2, Arterial 69.9 (L) 83.0 - 108.0 mmHg   Bicarbonate 27.6 20.0 - 28.0 mmol/L   Acid-base deficit 2.8 (H) 0.0 - 2.0 mmol/L   O2 Saturation 88.2 %   Patient temperature 98.6    Collection site LEFT RADIAL    Drawn by [id number]    Sample type ARTERIAL DRAW    Allens test (pass/fail) PASS PASS    Comment: Performed at [facility], [place] [address], [place], [place] [address]   Dg Chest Port 1 View  Result Date: 04/28/2018 CLINICAL DATA:  Cough.  History of hypertension and COPD. EXAM: PORTABLE CHEST 1 VIEW COMPARISON:  Radiographs 08/03/2017.  CT 10/17/2016. FINDINGS: 1547 hours. Two AP views obtained. The heart size and mediastinal contours are stable. There is mild cardiomegaly with atherosclerosis of the aorta and great vessels. The lungs are clear. There is no pleural effusion or pneumothorax. IVC filter noted. IMPRESSION: Stable chest.  No active cardiopulmonary process. Electronically Signed   By: Richardean Sale M.D.   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 no significant change Confirmed by Daleen Bo [phone]) on 04/28/2018 4:13:07 PM   Pending Labs Unresulted Labs (From admission, onward)    Start     Ordered   04/28/18 2019  Blood gas, arterial  Once,   STAT    Comments:  Call MD with results   Question:  Room air or oxygen  Answer:  Oxygen   04/28/18 2018   04/28/18 1856  Influenza panel by PCR (type A & B)  Once,   R     04/28/18 1855   04/28/18 1855  Respiratory Panel by PCR  (Respiratory virus panel with precautions)  Once,   R     04/28/18 1855   04/28/18 1853  Vitamin B1  Add-on,   R     04/28/18 1852   04/28/18 1853  Folate RBC  Add-on,   R     04/28/18 1852   04/28/18 1852  Urine rapid drug screen (hosp performed)  ONCE - STAT,   R     04/28/18 1851   04/28/18 1852  Ethanol  Add-on,   R     04/28/18 1852   04/28/18 1851  Protime-INR  Once,   R     04/28/18 1850   04/28/18 1851  Urinalysis, Routine w reflex microscopic  Once,   R     04/28/18 1851   04/28/18 1820  Blood gas, venous  ONCE - STAT,   STAT     04/28/18 1819   Signed and Held  HIV antibody  Tomorrow morning,   R     Signed and Held   Signed and Held  Magnesium  Tomorrow morning,   R    Comments:  Call MD if <1.5    Signed and Held   Signed and Held  Phosphorus  Tomorrow morning,   R     Signed and Held   Signed and Held  TSH  Once,   R    Comments:  Cancel if already done within 1 month and notify MD    Signed and Held   Signed and Held  Comprehensive metabolic panel  Once,   R    Comments:  Cal MD for K<3.5 or >5.0    Signed and Held   Signed and Held  CBC  Once,   R    Comments:  Call for hg <8.0    Signed and Held   Signed and Held  Troponin I - Now Then Q6H  Now then every 6 hours,   R     Signed and Held          Vitals/Pain Today's Vitals   04/28/18 1830  04/28/18 1900 04/28/18 1951 04/28/18 2013  BP: (!) 179/80 (!) 163/82 (!) 171/81   Pulse: 81 79 75 81  Resp: 19  17 16   Temp:      TempSrc:      SpO2: 97% 96% 96% 97%  Weight:      Height:      PainSc:        Isolation Precautions Droplet precaution  Medications Medications  0.9 %  sodium chloride infusion ( Intravenous New Bag/Given 04/28/18 1637)  albuterol (PROVENTIL,VENTOLIN) solution continuous neb (has no administration in time range)  thiamine (B-1) injection 100 mg (has no administration in time range)  sodium chloride 0.9 % bolus 500 mL (0 mLs Intravenous Stopped 04/28/18 1809)  methylPREDNISolone sodium succinate (SOLU-MEDROL) 125 mg/2 mL injection 125 mg (125 mg Intravenous Given 04/28/18 1823)    Mobility non-ambulatory

## 2018-04-28 NOTE — H&P (Signed)
[name] RSW:[id number] DOB: [birth year] DOA: 04/28/2018     PCP: [name], NP   Outpatient Specialists:    Pulmonary  Dr. Vaughan Browner    Patient arrived to ER on 04/28/18 at 1306  Patient coming from: home Lives  With family Brother    Chief Complaint:  Chief Complaint  Patient presents with  . Cough    HPI: [name] is a 78 y.o. 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 A. Fib,HLD, PAD, history of seizures remote not in the past 15 years, tobacco abuse, brain aneurysm status post clipping    Presented with cough.  Has according to his family has been coughing for at least 2 months he went to his primary care provider to start antibiotics and prednisone but cough never finished Reports generalized weakness not being himself.  Per his family yesterday he appeared to be more at his baseline.  On arrival to emergency department found to be hypoxic Patient himself unable to provide history Family reports he was dragging his right leg today He have had this few years ago but has improved since No upper extremity weakness Family reports increased confusion on baseline he knows his family He still smokes occasionally Walks independently drinks occasional beer.  NO recent seizures   Regarding pertinent Chronic problems: Re COPD Followed by Pulmonology   PFTs 02/03/17 FVC 1.28 [41%], FEV1 0.91 (40%], F/F 71, TLC 47, RV/TLC 174, DLCO 29% Severe obstruction with restriction and diffusion impairment, air trapping hx of diastolic CHF grade 2 diastolic dysfunction per echo in 2018 with preserved EF  htn on Coreg hydralazine, Cozaar 3 seizures on Depakote, Keppra  While in ER: Initial ABG 7.17/70 8.9/69.9 Initially noted to be hypercarbic and was lethargic started on BiPAP with improvement of mental status The following Work up has been ordered so far:  Orders Placed This Encounter    Procedures  . Critical Care  . DG Chest Port 1 View  . Blood gas, arterial  . Comprehensive metabolic panel  . CBC with Differential  . Troponin I - Once  . Blood gas, venous  . Cardiac monitoring  . Measure blood pressure  . Consult to hospitalist  . Bipap  . EKG 12-Lead      Following Medications were ordered in ER: Medications  0.9 %  sodium chloride infusion ( Intravenous New Bag/Given 04/28/18 1637)  albuterol (PROVENTIL,VENTOLIN) solution continuous neb (has no administration in time range)  sodium chloride 0.9 % bolus 500 mL (0 mLs Intravenous Stopped 04/28/18 1809)  methylPREDNISolone sodium succinate (SOLU-MEDROL) 125 mg/2 mL injection 125 mg (125 mg Intravenous Given 04/28/18 1823)    Significant initial  Findings: Abnormal Labs Reviewed  BLOOD GAS, ARTERIAL - Abnormal; Notable for the following components:      Result Value   pH, Arterial 7.170 (*)    pCO2 arterial 78.9 (*)    pO2, Arterial 69.9 (*)    Acid-base deficit 2.8 (*)    All other components within normal limits  COMPREHENSIVE METABOLIC PANEL - Abnormal; Notable for the following components:   Potassium 5.3 (*)    Glucose, Bld 129 (*)    Creatinine, Ser 1.32 (*)    Calcium 8.8 (*)    GFR calc non Af Amer 51 (*)    GFR calc Af Amer 59 (*)    All other components within normal limits  CBC WITH DIFFERENTIAL/PLATELET - Abnormal;  Notable for the following components:   RBC 4.18 (*)    MCV 102.6 (*)    Platelets 100 (*)    All other components within normal limits   Trop <0.03  Lactic Acid, Venous    Component Value Date/Time   LATICACIDVEN 1.0 08/19/2016 0454    Na 142 K 5.3  Cr  Stable,  Lab Results  Component Value Date   CREATININE 1.32 (H) 04/28/2018   CREATININE 1.27 (H) 08/11/2017   CREATININE 1.32 (H) 06/09/2017      WBC 5.4  HG/HCT  Up from baseline see below    Component Value Date/Time   HGB 13.0 04/28/2018 1549   HGB 11.7 (L) 11/21/2016 1126   HCT 42.9 04/28/2018  1549   HCT 35.8 (L) 11/21/2016 1126    PLT 100 down MCV 102.6  Troponin (Point of Care Test) No results for input(s): TROPIPOC in the last 72 hours.      UA ordered   CT HEAD  NON acute  CXR -  NON acute     ECG:  Personally reviewed by me showing: HR : 83 Rhythm:  NSR, left anterior fascicular block ischemic  no evidence of ischemic changes QTC 483     ED Triage Vitals  Enc Vitals Group     BP 04/28/18 1354 (!) 184/97     Pulse Rate 04/28/18 1354 (!) 102     Resp 04/28/18 1354 20     Temp 04/28/18 1354 99 F (37.2 C)     Temp Source 04/28/18 1354 Oral     SpO2 04/28/18 1354 (!) 76 %     Weight 04/28/18 1359 130 lb (59 kg)     Height 04/28/18 1359 5\' 6"  (1.676 m)     Head Circumference --      Peak Flow --      Pain Score 04/28/18 1359 0     Pain Loc --      Pain Edu? --      Excl. in [place]? --   TMAX(24)@       Latest  Blood pressure (!) 196/93, pulse 85, temperature 99 F (37.2 C), temperature source Oral, resp. rate (!) 21, height 5\' 6"  (1.676 m), weight 59 kg, SpO2 96 %.     Hospitalist was called for admission for hypercarbic respiratory failure currently on BiPAP   Review of Systems:    Pertinent positives include: Fevers, chills, fatigue  confusion Dragging right leg  non-productive cough Constitutional:  No weight loss, night sweats, , weight loss  HEENT:  No headaches, Difficulty swallowing,Tooth/dental problems,Sore throat,  No sneezing, itching, ear ache, nasal congestion, post nasal drip,  Cardio-vascular:  No chest pain, Orthopnea, PND, anasarca, dizziness, palpitations. no Bilateral lower extremity swelling  GI:  No heartburn, indigestion, abdominal pain, nausea, vomiting, diarrhea, change in bowel habits, loss of appetite, melena, blood in stool, hematemesis Resp:  no shortness of breath at rest. No dyspnea on exertion, No excess mucus, no productive cough, No, No coughing up of blood.No change in color of mucus.No wheezing. Skin:   no rash or lesions. No jaundice GU:  no dysuria, change in color of urine, no urgency or frequency. No straining to urinate.  No flank pain.  Musculoskeletal:  No joint pain or no joint swelling. No decreased range of motion. No back pain.  Psych:  No change in mood or affect. No depression or anxiety. No memory loss.  Neuro: no localizing neurological complaints, no tingling, no weakness, no  double vision, no gait abnormality, no slurred speech, no  All systems reviewed and apart from [place] all are negative  Past Medical History:   Past Medical History:  Diagnosis Date  . Alcohol abuse   . Anemia   . Aneurysm ([place])   . CKD (chronic kidney disease), stage III ([place])   . COPD (chronic obstructive pulmonary disease) ([place])   . Dysrhythmia   . Fever of unknown origin 08/2016  . History of atrial fibrillation   . Hyperchloremia   . Hypercholesteremia   . Hyperpotassemia   . Hypertension   . Hypertensive renal disease, benign   . Leg edema   . PAD (peripheral artery disease) ([place])   . Pneumonia   . Pressure ulcer of left heel   . Seizures ([place])    has not had a seizure in 15 yrs  . Tobacco use   . Weight loss      Past Surgical History:  Procedure Laterality Date  . ABDOMINAL AORTOGRAM W/LOWER EXTREMITY N/A 08/13/2016   Procedure: Abdominal Aortogram w/Lower Extremity;  Surgeon: Wellington Hampshire, MD;  Location: [facility];  Service: Cardiovascular;  Laterality: N/A;  . 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, 1996  . EYE SURGERY     December 2017  . PERIPHERAL VASCULAR BALLOON ANGIOPLASTY  08/13/2016   Procedure: Peripheral Vascular Balloon Angioplasty;  Surgeon: Wellington Hampshire, MD;  Location: [facility];  Service: Cardiovascular;;  Aborted    Social History:  Ambulatory   independently     reports that he quit smoking about 2 years ago. His smoking use included cigarettes. He has a 14.00 pack-year smoking history. He has never used smokeless tobacco. He  reports that he does not drink alcohol or use drugs.     Family History:   Family History  Problem Relation Age of Onset  . Cancer Mother   . Congestive Heart Failure Father   . Alzheimer's disease Sister   . Congestive Heart Failure Sister   . Clotting disorder Sister   . Congestive Heart Failure Brother   . Clotting disorder Sister     Allergies: Allergies  Allergen Reactions  . Diltiazem Rash and Other (See Comments)    Blisters  . Penicillins Other (See Comments)    Unknown Childhood allergy  Has patient had a PCN reaction causing immediate rash, facial/tongue/throat swelling, SOB or lightheadedness with hypotension: unknown Has patient had a PCN reaction causing severe rash involving mucus membranes or skin necrosis: unknown Has patient had a PCN reaction that required hospitalization unknown Has patient had a PCN reaction occurring within the last 10 years:no If all of the above answers are "NO", then may proceed with Cephalosporin use.      Prior to Admission medications   Medication Sig Start Date End Date Taking? Authorizing Provider  aspirin EC 81 MG tablet Take 1 tablet (81 mg total) by mouth daily. 08/05/16  Yes Wellington Hampshire, MD  atorvastatin (LIPITOR) 20 MG tablet TAKE 1 TABLET BY MOUTH EVERY DAY 12/02/17  Yes [name], NP  Carboxymethylcellulose Sodium (EYE DROPS OP) Apply 1 drop to eye daily as needed (dry eyes).   Yes [provider]  carvedilol (COREG) 3.125 MG tablet Take 1 tablet (3.125 mg total) by mouth 2 (two) times daily with a meal. 06/25/17  Yes [name], NP  Cyanocobalamin (VITAMIN B 12 PO) Take 1 tablet by mouth daily.   Yes [provider]  divalproex (DEPAKOTE) 250  MG DR tablet TAKE ONE TABLET BY MOUTH THREE TIMES DAILY FOR SEIZURES Patient taking differently: Take 250 mg by mouth 3 (three) times daily. Take one tablet by mouth three times daily for seizures 12/11/17  Yes Eulas Post, [place], DO  hydrALAZINE  (APRESOLINE) 50 MG tablet TAKE 1 TABLET BY MOUTH TWICE A DAY 03/08/18  Yes [name], NP  levETIRAcetam (KEPPRA) 500 MG tablet TAKE 1 TABLET BY MOUTH TWICE A DAY 02/08/18  Yes [name], NP  losartan (COZAAR) 25 MG tablet TAKE 1 TABLET BY MOUTH EVERY DAY 01/20/18  Yes [name], NP  memantine (NAMENDA) 10 MG tablet TAKE 1 TABLET BY MOUTH TWICE A DAY 03/08/18  Yes [name], NP  Multiple Vitamins-Minerals (THEREMS-M) TABS TAKE 1 TABLET BY MOUTH EVERY DAY 07/30/17  Yes [name], NP  POTASSIUM PO Take 1 tablet by mouth daily.   Yes [provider]  doxycycline (VIBRA-TABS) 100 MG tablet Take 1 tablet (100 mg total) by mouth 2 (two) times daily. Patient not taking: Reported on 04/28/2018 02/02/18   [name], NP  predniSONE (STERAPRED UNI-PAK 21 TAB) 10 MG (21) TBPK tablet Use as directed Patient not taking: Reported on 04/28/2018 02/02/18   [name], NP  thiamine ([facility] B-1) 100 MG tablet Take 1 tablet (100 mg total) by mouth daily. Patient not taking: Reported on 04/28/2018 01/29/17   [name], NP   Physical Exam: Blood pressure (!) 196/93, pulse 85, temperature 99 F (37.2 C), temperature source Oral, resp. rate (!) 21, height 5\' 6"  (1.676 m), weight 59 kg, SpO2 96 %. 1. General:  in No Acute distress on BiPAP   Chronically ill -appearing 2. Psychological: Alert not Oriented 3. Head/ENT:     Dry Mucous Membranes                          Head Non traumatic, neck supple                          Poor Dentition 4. SKIN:   decreased Skin turgor,  Skin clean Dry and intact no rash 5. Heart: Regular rate and rhythm no Murmur, no Rub or gallop 6. Lungs:occasional  wheezes or crackles   7. Abdomen: Soft, non-tender, Non distended  bowel sounds present 8. Lower extremities: no clubbing, cyanosis, or  edema strength in the lower extremity appears to be intact but not for to call or following commands secondary to history of  dementia 9. Neurologically Grossly intact, moving all 4 extremities equally   10. MSK: Normal range of motion   LABS:     Recent Labs  Lab 04/28/18 1549  WBC 5.4  NEUTROABS PENDING  HGB 13.0  HCT 42.9  MCV 102.6*  PLT 161*   Basic Metabolic Panel: Recent Labs  Lab 04/28/18 1549  NA 142  K 5.3*  CL 107  CO2 26  GLUCOSE 129*  BUN 23  CREATININE 1.32*  CALCIUM 8.8*      Recent Labs  Lab 04/28/18 1549  AST 32  ALT 18  ALKPHOS 72  BILITOT 0.4  PROT 8.1  ALBUMIN 4.2   No results for input(s): LIPASE, AMYLASE in the last 168 hours. No results for input(s): AMMONIA in the last 168 hours.    HbA1C: No results for input(s): HGBA1C in the last 72 hours. CBG: No results for input(s): GLUCAP in the last 168  hours.    Urine analysis:    Component Value Date/Time   COLORURINE YELLOW 08/18/2016 1420   APPEARANCEUR CLEAR 08/18/2016 1420   LABSPEC 1.013 08/18/2016 1420   PHURINE 5.0 08/18/2016 1420   GLUCOSEU NEGATIVE 08/18/2016 1420   HGBUR NEGATIVE 08/18/2016 1420   BILIRUBINUR NEGATIVE 08/18/2016 1420   KETONESUR NEGATIVE 08/18/2016 1420   PROTEINUR NEGATIVE 08/18/2016 1420   NITRITE NEGATIVE 08/18/2016 1420   LEUKOCYTESUR NEGATIVE 08/18/2016 1420       Cultures:    Component Value Date/Time   SDES BLOOD RIGHT ANTECUBITAL 08/18/2016 1400   SPECREQUEST  08/18/2016 1400    BOTTLES DRAWN AEROBIC AND ANAEROBIC Blood Culture adequate volume   CULT NO GROWTH 5 DAYS 08/18/2016 1400   REPTSTATUS 08/23/2016 FINAL 08/18/2016 1400     Radiological Exams on Admission: Dg Chest Port 1 View  Result Date: 04/28/2018 CLINICAL DATA:  Cough.  History of hypertension and COPD. EXAM: PORTABLE CHEST 1 VIEW COMPARISON:  Radiographs 08/03/2017.  CT 10/17/2016. FINDINGS: 1547 hours. Two AP views obtained. The heart size and mediastinal contours are stable. There is mild cardiomegaly with atherosclerosis of the aorta and great vessels. The lungs are clear. There is no  pleural effusion or pneumothorax. IVC filter noted. IMPRESSION: Stable chest.  No active cardiopulmonary process. Electronically Signed   By: Richardean Sale M.D.   On: 04/28/2018 16:06    Chart has been reviewed    Assessment/Plan   78 y.o. 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 A. Fib,HLD, PAD, history of seizures remote not in the past 15 years, tobacco abuse  Admitted for hypercarbic respiratory failure currently on BiPAP    Present on Admission: . COPD exacerbation ([place]) -  Will initiate: Steroid taper  -  Antibiotics oxycycline, - XopenexPRN, - scheduled duoneb,  -  Breo or Dulera at discharge   -  Mucinex.  Titrate O2 to saturation >90%. Follow patients respiratory status.  Order respiratory panel and influenza PCR -  PCCM consulted for e-link monitoring,  -  BiPAP ordered PRN for increased work of breathing.  Currently mentation Improved will repeat blood gas  . Essential hypertension, benign -home medications but hold ARB given slightly elevated potassium . Hyperlipidemia LDL goal <130 -continue home medications . PVD (peripheral vascular disease) ([place]) stable patient unable to provide history regarding claudication unsure if he has been having pain in the right lower extremity can evaluate with ABI vague complaint . Chronic diastolic heart failure ([facility]) currently appears to be euvolemic . Alcohol abuse states family currently improving monitor for any signs of withdrawal   . Respiratory failure with hypoxia and hypercapnia ([facility]) secondary to COPD exacerbation treat appropriately and monitor no chest pain no lower extremity edema . Thrombocytopenia ([place]) patient history of alcohol abuse has had low platelet counts in the past but this is progressed.  Will follow and if continues to get worse will need to further be evaluated   Other plan as per orders.  DVT prophylaxis:  SCD    Code  Status:  FULL CODE   as per family I had personally discussed CODE STATUS with patient and family   Family Communication:   Family   at  Bedside  plan of care was discussed with niece Disposition Plan:       To home once workup is complete and patient is stable  Would benefit from PT/OT eval prior to DC  Ordered                   Swallow eval - SLP ordered                     Social Work  consulted                   Nutrition    consulted                                   Consults called:  PCCM for Am consult and overnight e-link monitoring  Admission status:   inpatient     Expect 2 midnight stay secondary to severity of patient's current illness including   hemodynamic instability despite optimal treatment (    hypoxia, hypercapnia)   Severe lab/radiological abnormalities including:   hypercarbia and extensive comorbidities including:  substance abuse       CHF    COPD    CKD  dementia   .    That are currently affecting medical management.   I expect  patient to be hospitalized for 2 midnights requiring inpatient medical care.  Patient is at high risk for adverse outcome (such as loss of life or disability) if not treated.  Indication for inpatient stay as follows:  Hemodynamic instability despite maximal medical therapy,      Need for BiPAP New or worsening hypoxia  Need for IV antibiotics,     Level of care        SDU tele indefinitely please discontinue once patient no longer qualifies   Jenin Birdsall 04/28/2018, 8:17 PM    [facility]     after 2 AM please page floor coverage PA If 7AM-7PM, please contact the day team taking care of the patient using [email or web address]

## 2018-04-28 NOTE — ED Notes (Signed)
Niece Nolon Stalls phone # [phone]

## 2018-04-28 NOTE — Progress Notes (Signed)
eLink Physician-Brief Progress Note Patient Name: [name] DOB: 06-06-1940 MRN: [id number]   Date of Service  04/28/2018  HPI/Events of Note  Pt transferred to the floor ICU due to acute on chronic respiratory failure requiring BIPAP/ Pt is followed as out patient by [employer]. Hospitalist requesting routine follow up by [facility] in AM.  eICU Interventions  Will add patient to the list.        Kerry Kass Ogan 04/28/2018, 9:24 PM

## 2018-04-28 NOTE — ED Provider Notes (Signed)
[facility] DEPT Provider Note   CSN: [id number] Arrival date & time: 04/28/18  1306     History   Chief Complaint Chief Complaint  Patient presents with  . Cough    HPI [name] is a 78 y.o. male.  HPI   He is here for evaluation of cough, weakness, and not acting like himself according to his niece who is here with him.  She came to his house and found him in this condition and brought him here by private vehicle.  On arrival he was severely hypoxic.  His niece sees him daily and yesterday he was pretty much at his normal other than having the cough.  He was treated for a cough a couple months ago with antibiotics and prednisone.  Patient lives with his brother.  He is unable to give any history.  Level 5 caveat-altered mental status  Past Medical History:  Diagnosis Date  . Alcohol abuse   . Anemia   . Aneurysm ([place])   . CKD (chronic kidney disease), stage III ([place])   . COPD (chronic obstructive pulmonary disease) ([place])   . Dysrhythmia   . Fever of unknown origin 08/2016  . History of atrial fibrillation   . Hyperchloremia   . Hypercholesteremia   . Hyperpotassemia   . Hypertension   . Hypertensive renal disease, benign   . Leg edema   . PAD (peripheral artery disease) ([place])   . Pneumonia   . Pressure ulcer of left heel   . Seizures ([place])    has not had a seizure in 15 yrs  . Tobacco use   . Weight loss     Patient Active Problem List   Diagnosis Date Noted  . COPD exacerbation ([place]) 04/28/2018  . Respiratory failure with hypoxia and hypercapnia ([place]) 04/28/2018  . Thrombocytopenia ([place]) 04/28/2018  . Dementia with behavioral disturbance ([place]) 03/10/2017  . Loss of weight 03/10/2017  . COPD, group B, by GOLD 2017 classification ([place]) 02/03/2017  . Atrial fibrillation with RVR ([place]) 04/14/2016  . Chronic diastolic heart failure ([place]) 04/14/2016  . Alcohol abuse 04/14/2016  . PVD (peripheral vascular disease) ([place])  03/07/2014  . Essential hypertension, benign 02/02/2013  . Routine general medical examination at a health care facility 02/02/2013  . Hyperlipidemia LDL goal <130 02/02/2013  . Hypertensive renal disease 02/02/2013  . Seizures ([place]) 07/28/2012  . Tobacco use disorder 07/28/2012    Past Surgical History:  Procedure Laterality Date  . ABDOMINAL AORTOGRAM W/LOWER EXTREMITY N/A 08/13/2016   Procedure: Abdominal Aortogram w/Lower Extremity;  Surgeon: Wellington Hampshire, MD;  Location: [facility];  Service: Cardiovascular;  Laterality: N/A;  . 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, 1996  . EYE SURGERY     December 2017  . PERIPHERAL VASCULAR BALLOON ANGIOPLASTY  08/13/2016   Procedure: Peripheral Vascular Balloon Angioplasty;  Surgeon: Wellington Hampshire, MD;  Location: [facility];  Service: Cardiovascular;;  Aborted        Home Medications    Prior to Admission medications   Medication Sig Start Date End Date Taking? Authorizing Provider  aspirin EC 81 MG tablet Take 1 tablet (81 mg total) by mouth daily. 08/05/16  Yes Wellington Hampshire, MD  atorvastatin (LIPITOR) 20 MG tablet TAKE 1 TABLET BY MOUTH EVERY DAY 12/02/17  Yes [name], NP  Carboxymethylcellulose Sodium (EYE DROPS OP) Apply 1 drop to eye daily as needed (dry eyes).   Yes [provider]  carvedilol (COREG) 3.125 MG tablet Take 1 tablet (3.125 mg total) by mouth 2 (two) times daily with a meal. 06/25/17  Yes Eubanks, Carlos American, NP  Cyanocobalamin (VITAMIN B 12 PO) Take 1 tablet by mouth daily.   Yes [provider]  divalproex (DEPAKOTE) 250 MG DR tablet TAKE ONE TABLET BY MOUTH THREE TIMES DAILY FOR SEIZURES Patient taking differently: Take 250 mg by mouth 3 (three) times daily. Take one tablet by mouth three times daily for seizures 12/11/17  Yes Eulas Post, [place], DO  hydrALAZINE (APRESOLINE) 50 MG tablet TAKE 1 TABLET BY MOUTH TWICE A DAY 03/08/18  Yes [name], NP  levETIRAcetam  (KEPPRA) 500 MG tablet TAKE 1 TABLET BY MOUTH TWICE A DAY 02/08/18  Yes [name], NP  losartan (COZAAR) 25 MG tablet TAKE 1 TABLET BY MOUTH EVERY DAY 01/20/18  Yes [name], NP  memantine (NAMENDA) 10 MG tablet TAKE 1 TABLET BY MOUTH TWICE A DAY 03/08/18  Yes [name], NP  Multiple Vitamins-Minerals (THEREMS-M) TABS TAKE 1 TABLET BY MOUTH EVERY DAY 07/30/17  Yes [name], NP  POTASSIUM PO Take 1 tablet by mouth daily.   Yes [provider]  doxycycline (VIBRA-TABS) 100 MG tablet Take 1 tablet (100 mg total) by mouth 2 (two) times daily. Patient not taking: Reported on 04/28/2018 02/02/18   [name], NP  predniSONE (STERAPRED UNI-PAK 21 TAB) 10 MG (21) TBPK tablet Use as directed Patient not taking: Reported on 04/28/2018 02/02/18   [name], NP  thiamine ([facility] B-1) 100 MG tablet Take 1 tablet (100 mg total) by mouth daily. Patient not taking: Reported on 04/28/2018 01/29/17   [name], NP    Family History Family History  Problem Relation Age of Onset  . Cancer Mother   . Congestive Heart Failure Father   . Alzheimer's disease Sister   . Congestive Heart Failure Sister   . Clotting disorder Sister   . Congestive Heart Failure Brother   . Clotting disorder Sister     Social History Social History   Tobacco Use  . Smoking status: Former Smoker    Packs/day: 0.25    Years: 56.00    Pack years: 14.00    Types: Cigarettes    Last attempt to quit: 2018    Years since quitting: 2.0  . Smokeless tobacco: Never Used  Substance Use Topics  . Alcohol use: No    Alcohol/week: 0.0 standard drinks    Comment: QUIT DRINKING  04/2016  . Drug use: No     Allergies   Diltiazem and Penicillins   Review of Systems Review of Systems  Unable to perform ROS: Mental status change     Physical Exam Updated Vital Signs BP (!) 163/82   Pulse 79   Temp 99 F (37.2 C) (Oral)   Resp 19   Ht 5\' 6"  (1.676 m)   Wt 59  kg   SpO2 96%   BMI 20.98 kg/m   Physical Exam Vitals signs and nursing note reviewed.  Constitutional:      General: He is in acute distress.     Appearance: He is well-developed. He is ill-appearing and toxic-appearing. He is not diaphoretic.  HENT:     Head: Normocephalic and atraumatic.     Right Ear: External ear normal.     Left Ear: External ear normal.     Nose: Nose normal. No congestion or rhinorrhea.  Mouth/Throat:     Mouth: Mucous membranes are moist.     Pharynx: Oropharynx is clear. No oropharyngeal exudate or posterior oropharyngeal erythema.     Comments: Dental plates are loose fitting Eyes:     General: No scleral icterus.       Right eye: No discharge.        Left eye: No discharge.     Conjunctiva/sclera: Conjunctivae normal.     Pupils: Pupils are equal, round, and reactive to light.  Neck:     Musculoskeletal: Normal range of motion and neck supple. No neck rigidity or muscular tenderness.     Trachea: Phonation normal.  Cardiovascular:     Rate and Rhythm: Regular rhythm. Tachycardia present.     Heart sounds: Normal heart sounds.     Comments: Hypertensive Pulmonary:     Effort: Respiratory distress present.     Breath sounds: No stridor. Rhonchi present.     Comments: Decreased air movement bilaterally.  Mild increased work of breathing with tachypnea.  Oxygen saturation 99% on 4 L nasal cannula supplementation. Abdominal:     General: There is no distension.     Palpations: Abdomen is soft. There is no mass.     Tenderness: There is no abdominal tenderness.  Musculoskeletal: Normal range of motion.     Right lower leg: No edema.     Left lower leg: No edema.  Skin:    General: Skin is warm and dry.     Coloration: Skin is not jaundiced or pale.  Neurological:     Mental Status: He is alert and oriented to person, place, and time.     Cranial Nerves: No cranial nerve deficit.     Sensory: No sensory deficit.     Motor: No abnormal muscle  tone.     Coordination: Coordination normal.  Psychiatric:        Behavior: Behavior normal.        Thought Content: Thought content normal.        Judgment: Judgment normal.      ED Treatments / Results  Labs (all labs ordered are listed, but only abnormal results are displayed) Labs Reviewed  BLOOD GAS, ARTERIAL - Abnormal; Notable for the following components:      Result Value   pH, Arterial 7.170 (*)    pCO2 arterial 78.9 (*)    pO2, Arterial 69.9 (*)    Acid-base deficit 2.8 (*)    All other components within normal limits  COMPREHENSIVE METABOLIC PANEL - Abnormal; Notable for the following components:   Potassium 5.3 (*)    Glucose, Bld 129 (*)    Creatinine, Ser 1.32 (*)    Calcium 8.8 (*)    GFR calc non Af Amer 51 (*)    GFR calc Af Amer 59 (*)    All other components within normal limits  CBC WITH DIFFERENTIAL/PLATELET - Abnormal; Notable for the following components:   RBC 4.18 (*)    MCV 102.6 (*)    Platelets 100 (*)    All other components within normal limits  RESPIRATORY PANEL BY PCR  TROPONIN I  BLOOD GAS, VENOUS  PROTIME-INR  URINALYSIS, ROUTINE W REFLEX MICROSCOPIC  RAPID URINE DRUG SCREEN, HOSP PERFORMED  ETHANOL  VITAMIN B1  FOLATE RBC  INFLUENZA PANEL BY PCR (TYPE A & B)    EKG EKG Interpretation  Date/Time:  Wednesday April 28 2018 14:09:00 EST Ventricular Rate:  83 PR Interval:    QRS Duration:  86 QT Interval:  367 QTC Calculation: 432 R Axis:   -78 Text Interpretation:  Sinus rhythm Left anterior fascicular block Anterior infarct, old since last tracing no significant change Confirmed by Daleen Bo [phone]) on 04/28/2018 4:13:07 PM   Radiology Dg Chest Port 1 View  Result Date: 04/28/2018 CLINICAL DATA:  Cough.  History of hypertension and COPD. EXAM: PORTABLE CHEST 1 VIEW COMPARISON:  Radiographs 08/03/2017.  CT 10/17/2016. FINDINGS: 1547 hours. Two AP views obtained. The heart size and mediastinal contours are stable. There  is mild cardiomegaly with atherosclerosis of the aorta and great vessels. The lungs are clear. There is no pleural effusion or pneumothorax. IVC filter noted. IMPRESSION: Stable chest.  No active cardiopulmonary process. Electronically Signed   By: Richardean Sale M.D.   On: 04/28/2018 16:06    Procedures .Critical Care Performed by: Daleen Bo, MD Authorized by: Daleen Bo, MD   Critical care provider statement:    Critical care time (minutes):  35   Critical care start time:  04/28/2018 3:00 PM   Critical care end time:  04/28/2018 5:56 PM   Critical care time was exclusive of:  Separately billable procedures and treating other patients   Critical care was necessary to treat or prevent imminent or life-threatening deterioration of the following conditions:  Respiratory failure   Critical care was time spent personally by me on the following activities:  Blood draw for specimens, development of treatment plan with patient or surrogate, discussions with consultants, evaluation of patient's response to treatment, examination of patient, obtaining history from patient or surrogate, ordering and performing treatments and interventions, ordering and review of laboratory studies, pulse oximetry, re-evaluation of patient's condition, review of old charts and ordering and review of radiographic studies   (including critical care time)  Medications Ordered in ED Medications  0.9 %  sodium chloride infusion ( Intravenous New Bag/Given 04/28/18 1637)  albuterol (PROVENTIL,VENTOLIN) solution continuous neb (has no administration in time range)  thiamine (B-1) injection 100 mg (has no administration in time range)  sodium chloride 0.9 % bolus 500 mL (0 mLs Intravenous Stopped 04/28/18 1809)  methylPREDNISolone sodium succinate (SOLU-MEDROL) 125 mg/2 mL injection 125 mg (125 mg Intravenous Given 04/28/18 1823)     Initial Impression / Assessment and Plan / ED Course  I have reviewed the triage  vital signs and the nursing notes.  Pertinent labs & imaging results that were available during my care of the patient were reviewed by me and considered in my medical decision making (see chart for details).  Clinical Course as of Apr 28 1914  Wed Apr 28, 2018  1533 I am concerned this patient is hypercarbic.  Oxygen O2 2 L by nasal cannula and will check blood gas.   [EW]  1640 Abnormal, pH low PCO2 high  Blood gas, arterial(!!) [EW]  1640 Normal  Troponin I - Once [EW]  1641 Normal except potassium high, glucose high, creatinine high, calcium low  Comprehensive metabolic panel(!) [EW]  3557 No infiltrate or CHF, images reviewed by me  DG Chest Port 1 View [EW]  1752 Normal except MCV high, platelets low  CBC with Differential(!) [EW]  1908 Patient's niece, whose name is Adela Lank, states that the patient would want to be intubated if needed.  She states that he has dementia, and that he has a power of attorney who is another relative, but she is not currently here.   [EW]    Clinical Course User Index [EW] Daleen Bo, MD  Patient Vitals for the past 24 hrs:  BP Temp Temp src Pulse Resp SpO2 Height Weight  04/28/18 1900 (!) 163/82 - - 79 - 96 % - -  04/28/18 1830 (!) 179/80 - - 81 19 97 % - -  04/28/18 1800 (!) 177/144 - - 77 18 97 % - -  04/28/18 1721 - - - 85 (!) 21 96 % - -  04/28/18 1700 (!) 196/93 - - 83 (!) 21 94 % - -  04/28/18 1626 - - - (!) 104 (!) 25 95 % - -  04/28/18 1450 (!) 228/104 - - (!) 107 (!) 21 99 % - -  04/28/18 1359 - - - - - - 5\' 6"  (1.676 m) 59 kg  04/28/18 1354 (!) 184/97 99 F (37.2 C) Oral (!) 102 20 (!) 76 % - -    5:54 PM Reevaluation with update and discussion. After initial assessment and treatment, an updated evaluation reveals on BiPAP patient is alert and communicative.  Blood pressure improved.  Oxygen saturation 96%.  Patient and niece updated on findings and plan.  They are agreeable to hospitalization.Daleen Bo   Medical  Decision Making: Hypercapnic respiratory failure likely secondary to COPD.  No evidence for pneumonia.  Doubt sepsis.  Doubt impending vascular collapse.  CRITICAL CARE-yes Performed by: Daleen Bo   Nursing Notes Reviewed/ Care Coordinated Applicable Imaging Reviewed Interpretation of Laboratory Data incorporated into ED treatment  5:56 PM-Consult complete with hospitalist. Patient case explained and discussed.  She agrees to admit patient for further evaluation and treatment. Call ended at 6:20 PM  Plan: Admit   Final Clinical Impressions(s) / ED Diagnoses   Final diagnoses:  Acute respiratory failure with hypercapnia ([place])  Hypoxia  Thrombocytopenia ([place])  Tobacco abuse    ED Discharge Orders    None       Daleen Bo, MD 04/28/18 1916

## 2018-04-28 NOTE — Progress Notes (Signed)
CRITICAL VALUE ALERT  Critical Value:  Troponin 0.04  Date & Time Notied:  2305pm 04/28/18  Provider Notified: 2312pm  Orders Received/Actions taken: No orders received EKG obtained - Results unremarkable - No ST elevation/depression noted

## 2018-04-28 NOTE — ED Notes (Signed)
ED TO INPATIENT HANDOFF REPORT  Name/Age/Gender [name] 78 y.o. male  Code Status Code Status History    Date Active Date Inactive Code Status Order ID Comments User Context   08/18/2016 1705 08/23/2016 2012 DNR [id number]  Geradine Girt, DO ED   08/13/2016 1355 08/13/2016 2237 Full Code [id number]  Wellington Hampshire, MD Inpatient   04/14/2016 2052 04/19/2016 1650 Full Code [id number]  Ivor Costa, MD ED    Questions for Most Recent Historical Code Status (Order [id number])    Question Answer Comment   In the event of cardiac or respiratory ARREST Do not call a "code blue"    In the event of cardiac or respiratory ARREST Do not perform Intubation, CPR, defibrillation or ACLS    In the event of cardiac or respiratory ARREST Use medication by any route, position, wound care, and other measures to relive pain and suffering. May use oxygen, suction and manual treatment of airway obstruction as needed for comfort.         Advance Directive Documentation     Most Recent Value  Type of Advance Directive  Living will, Healthcare Power of Attorney  Pre-existing out of facility DNR order (yellow form or pink MOST form)  -  "MOST" Form in Place?  -      Home/SNF/Other Home  Chief Complaint cough   Level of Care/Admitting Diagnosis ED Disposition    ED Disposition Condition [place]: [facility] [[id number]]  Level of Care: Stepdown [14]  Admit to SDU based on following criteria: Respiratory Distress:  Frequent assessment and/or intervention to maintain adequate ventilation/respiration, pulmonary toilet, and respiratory treatment.  Diagnosis: COPD exacerbation [facility]) [[id number]]  Admitting Physician: Toy Baker [[id number]]  Attending Physician: Toy Baker [[id number]]  Estimated length of stay: 3 - 4 days  Certification:: I certify this patient will need inpatient services for at least 2 midnights  PT Class (Do Not Modify): Inpatient [101]  PT Acc  Code (Do Not Modify): Private [1]       Medical History Past Medical History:  Diagnosis Date  . Alcohol abuse   . Anemia   . Aneurysm ([place])   . CKD (chronic kidney disease), stage III ([place])   . COPD (chronic obstructive pulmonary disease) ([place])   . Dysrhythmia   . Fever of unknown origin 08/2016  . History of atrial fibrillation   . Hyperchloremia   . Hypercholesteremia   . Hyperpotassemia   . Hypertension   . Hypertensive renal disease, benign   . Leg edema   . PAD (peripheral artery disease) ([place])   . Pneumonia   . Pressure ulcer of left heel   . Seizures ([place])    has not had a seizure in 15 yrs  . Tobacco use   . Weight loss     Allergies Allergies  Allergen Reactions  . Diltiazem Rash and Other (See Comments)    Blisters  . Penicillins Other (See Comments)    Unknown Childhood allergy  Has patient had a PCN reaction causing immediate rash, facial/tongue/throat swelling, SOB or lightheadedness with hypotension: unknown Has patient had a PCN reaction causing severe rash involving mucus membranes or skin necrosis: unknown Has patient had a PCN reaction that required hospitalization unknown Has patient had a PCN reaction occurring within the last 10 years:no If all of the above answers are "NO", then may proceed with Cephalosporin use.     IV Location/Drains/Wounds Patient Lines/Drains/Airways Status  Active Line/Drains/Airways    Name:   Placement date:   Placement time:   Site:   Days:   Peripheral IV 09/18/16 Right Forearm   09/18/16    0927    Forearm   587   Peripheral IV 04/28/18 Left Forearm   04/28/18    2031    Forearm   less than 1   Pressure Injury 08/19/16 Deep Tissue Injury - Purple or maroon localized area of discolored intact skin or blood-filled blister due to damage of underlying soft tissue from pressure and/or shear. left plantar foot with deep tissue injury.  Left heel with   08/19/16    1043     617          Labs/Imaging Results for  orders placed or performed during the hospital encounter of 04/28/18 (from the past 48 hour(s))  Comprehensive metabolic panel     Status: Abnormal   Collection Time: 04/28/18  3:49 PM  Result Value Ref Range   Sodium 142 135 - 145 mmol/L   Potassium 5.3 (H) 3.5 - 5.1 mmol/L   Chloride 107 98 - 111 mmol/L   CO2 26 22 - 32 mmol/L   Glucose, Bld 129 (H) 70 - 99 mg/dL   BUN 23 8 - 23 mg/dL   Creatinine, Ser 1.32 (H) 0.61 - 1.24 mg/dL   Calcium 8.8 (L) 8.9 - 10.3 mg/dL   Total Protein 8.1 6.5 - 8.1 g/dL   Albumin 4.2 3.5 - 5.0 g/dL   AST 32 15 - 41 U/L   ALT 18 0 - 44 U/L   Alkaline Phosphatase 72 38 - 126 U/L   Total Bilirubin 0.4 0.3 - 1.2 mg/dL   GFR calc non Af Amer 51 (L) >60 mL/min   GFR calc Af Amer 59 (L) >60 mL/min   Anion gap 9 5 - 15    Comment: Performed at [facility], [place] [address] [place], [place] [address]  CBC with Differential     Status: Abnormal   Collection Time: 04/28/18  3:49 PM  Result Value Ref Range   WBC 5.4 4.0 - 10.5 K/uL   RBC 4.18 (L) 4.22 - 5.81 MIL/uL   Hemoglobin 13.0 13.0 - 17.0 g/dL   HCT 42.9 39.0 - 52.0 %   MCV 102.6 (H) 80.0 - 100.0 fL   MCH 31.1 26.0 - 34.0 pg   MCHC 30.3 30.0 - 36.0 g/dL   RDW 15.2 11.5 - 15.5 %   Platelets 100 (L) 150 - 400 K/uL    Comment: REPEATED TO VERIFY PLATELET COUNT CONFIRMED BY SMEAR SPECIMEN CHECKED FOR CLOTS Immature Platelet Fraction may be clinically indicated, consider ordering this additional test [id number]    nRBC 0.0 0.0 - 0.2 %   Neutrophils Relative % 58 %   Neutro Abs 3.1 1.7 - 7.7 K/uL   Lymphocytes Relative 31 %   Lymphs Abs 1.7 0.7 - 4.0 K/uL   Monocytes Relative 10 %   Monocytes Absolute 0.6 0.1 - 1.0 K/uL   Eosinophils Relative 0 %   Eosinophils Absolute 0.0 0.0 - 0.5 K/uL   Basophils Relative 1 %   Basophils Absolute 0.0 0.0 - 0.1 K/uL   WBC Morphology TOXIC GRANULATION     Comment: VACUOLATED NEUTROPHILS   Immature Granulocytes 0 %   Abs Immature Granulocytes  0.02 0.00 - 0.07 K/uL    Comment: Performed at [facility], [place] [address], [place], [place] [address]  Troponin I -  Once     Status: None   Collection Time: 04/28/18  3:49 PM  Result Value Ref Range   Troponin I <0.03 <0.03 ng/mL    Comment: Performed at [facility], [place] [address], [place], [place] [address]  Blood gas, arterial     Status: Abnormal   Collection Time: 04/28/18  4:09 PM  Result Value Ref Range   O2 Content 2.0 L/min   pH, Arterial 7.170 (LL) 7.350 - 7.450    Comment: CRITICAL RESULT CALLED TO, READ BACK BY AND VERIFIED WITH: dr Eulis Foster by Eben Burow rrt rcp on 04/28/18 at 1612    pCO2 arterial 78.9 (HH) 32.0 - 48.0 mmHg    Comment: CRITICAL RESULT CALLED TO, READ BACK BY AND VERIFIED WITH: dr Eulis Foster by Eben Burow rrt rcp on 04/28/18 at 1612    pO2, Arterial 69.9 (L) 83.0 - 108.0 mmHg   Bicarbonate 27.6 20.0 - 28.0 mmol/L   Acid-base deficit 2.8 (H) 0.0 - 2.0 mmol/L   O2 Saturation 88.2 %   Patient temperature 98.6    Collection site LEFT RADIAL    Drawn by [id number]    Sample type ARTERIAL DRAW    Allens test (pass/fail) PASS PASS    Comment: Performed at [facility], [place] [address], [place], [place] [address]   Ct Head Wo Contrast  Result Date: 04/28/2018 CLINICAL DATA:  Altered mental status EXAM: CT HEAD WITHOUT CONTRAST TECHNIQUE: Contiguous axial images were obtained from the base of the skull through the vertex without intravenous contrast. COMPARISON:  Head CT 08/14/2016 FINDINGS: Brain: There is no mass, hemorrhage or extra-axial collection. There is generalized atrophy without lobar predilection. Hypodensity of the white matter is most commonly associated with chronic microvascular disease. Right frontal and anterior left temporal encephalomalacia is unchanged. Vascular: Aneurysm clip adjacent to the left carotid terminus. Skull: Old left pterional craniotomy and right frontal craniotomy.  Sinuses/Orbits: No fluid levels or advanced mucosal thickening of the visualized paranasal sinuses. No mastoid or middle ear effusion. The orbits are normal. IMPRESSION: Unchanged examination without acute intracranial abnormality. Electronically Signed   By: Ulyses Jarred M.D.   On: 04/28/2018 20:40   Dg Chest Port 1 View  Result Date: 04/28/2018 CLINICAL DATA:  Cough.  History of hypertension and COPD. EXAM: PORTABLE CHEST 1 VIEW COMPARISON:  Radiographs 08/03/2017.  CT 10/17/2016. FINDINGS: 1547 hours. Two AP views obtained. The heart size and mediastinal contours are stable. There is mild cardiomegaly with atherosclerosis of the aorta and great vessels. The lungs are clear. There is no pleural effusion or pneumothorax. IVC filter noted. IMPRESSION: Stable chest.  No active cardiopulmonary process. Electronically Signed   By: Richardean Sale M.D.   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 tracing no significant change Confirmed by Daleen Bo [phone]) on 04/28/2018 4:13:07 PM   Pending Labs Unresulted Labs (From admission, onward)    Start     Ordered   04/28/18 2019  Blood gas, arterial  Once,   STAT    Comments:  Call MD with results   Question:  Room air or oxygen  Answer:  Oxygen   04/28/18 2018   04/28/18 1856  Influenza panel by PCR (type  A & B)  Once,   R     04/28/18 1855   04/28/18 1855  Respiratory Panel by PCR  (Respiratory virus panel with precautions)  Once,   R     04/28/18 1855   04/28/18 1853  Vitamin B1  Add-on,   R     04/28/18 1852   04/28/18 1853  Folate RBC  Add-on,   R     04/28/18 1852   04/28/18 1852  Urine rapid drug screen (hosp performed)  ONCE - STAT,   R     04/28/18 1851   04/28/18 1852   Ethanol  Add-on,   R     04/28/18 1852   04/28/18 1851  Protime-INR  Once,   R     04/28/18 1850   04/28/18 1851  Urinalysis, Routine w reflex microscopic  Once,   R     04/28/18 1851   04/28/18 1820  Blood gas, venous  ONCE - STAT,   STAT     04/28/18 1819   Signed and Held  HIV antibody  Tomorrow morning,   R     Signed and Held   Signed and Held  Magnesium  Tomorrow morning,   R    Comments:  Call MD if <1.5    Signed and Held   Signed and Held  Phosphorus  Tomorrow morning,   R     Signed and Held   Signed and Held  TSH  Once,   R    Comments:  Cancel if already done within 1 month and notify MD    Signed and Held   Signed and Held  Comprehensive metabolic panel  Once,   R    Comments:  Cal MD for K<3.5 or >5.0    Signed and Held   Visual merchandiser and Held  CBC  Once,   R    Comments:  Call for hg <8.0    Signed and Held   Signed and Held  Troponin I - Now Then Q6H  Now then every 6 hours,   R     Signed and Held          Vitals/Pain Today's Vitals   04/28/18 1951 04/28/18 2000 04/28/18 2013 04/28/18 2030  BP: (!) 171/81 (!) 170/80  (!) 165/69  Pulse: 75 79 81 77  Resp: 17 19 16  (!) 23  Temp:      TempSrc:      SpO2: 96% 96% 97% 100%  Weight:      Height:      PainSc:        Isolation Precautions Droplet precaution  Medications Medications  0.9 %  sodium chloride infusion ( Intravenous Transfusing/Transfer 04/28/18 2050)  albuterol (PROVENTIL,VENTOLIN) solution continuous neb (has no administration in time range)  thiamine (B-1) injection 100 mg (has no administration in time range)  sodium chloride 0.9 % bolus 500 mL (0 mLs Intravenous Stopped 04/28/18 1809)  methylPREDNISolone sodium succinate (SOLU-MEDROL) 125 mg/2 mL injection 125 mg (125 mg Intravenous Given 04/28/18 1823)    Mobility non-ambulatory

## 2018-04-28 NOTE — Progress Notes (Signed)
Jeanie Sewer, RN in the ED notified me at 2025 of a delay in ED throughput.  Mortimer Fries stated he had been trying to call RN for handoff but no answer at number entered in handoff report.  The phone number in handoff report was not the correct number.  I gave Mortimer Fries the correct number.  Report immediately taken by Diego Cory.  Jacqulyn Ducking ICU/SD RN4 / Care Coordinator / Rapid Response Nurse Rapid Response Number:  [phone]

## 2018-04-28 NOTE — ED Triage Notes (Signed)
Patient's niece reports that the patient had a cough 2 months ago and was taken to the PCP where he was placed on antibiotics and Prednisone. Patient's niece reports that the cough never cleared totally and has been getting gradually worse.

## 2018-04-29 ENCOUNTER — Inpatient Hospital Stay (HOSPITAL_COMMUNITY): Payer: Medicare Other

## 2018-04-29 DIAGNOSIS — J449 Chronic obstructive pulmonary disease, unspecified: Secondary | ICD-10-CM

## 2018-04-29 DIAGNOSIS — E440 Moderate protein-calorie malnutrition: Secondary | ICD-10-CM

## 2018-04-29 DIAGNOSIS — M79609 Pain in unspecified limb: Secondary | ICD-10-CM

## 2018-04-29 DIAGNOSIS — I10 Essential (primary) hypertension: Secondary | ICD-10-CM

## 2018-04-29 DIAGNOSIS — F1010 Alcohol abuse, uncomplicated: Secondary | ICD-10-CM

## 2018-04-29 DIAGNOSIS — I5032 Chronic diastolic (congestive) heart failure: Secondary | ICD-10-CM

## 2018-04-29 DIAGNOSIS — J441 Chronic obstructive pulmonary disease with (acute) exacerbation: Principal | ICD-10-CM

## 2018-04-29 LAB — RESPIRATORY PANEL BY PCR
Adenovirus: NOT DETECTED
Bordetella pertussis: NOT DETECTED
Chlamydophila pneumoniae: NOT DETECTED
Coronavirus 229E: NOT DETECTED
Coronavirus HKU1: NOT DETECTED
Coronavirus NL63: NOT DETECTED
Coronavirus OC43: NOT DETECTED
INFLUENZA B-RVPPCR: NOT DETECTED
Influenza A: NOT DETECTED
Metapneumovirus: DETECTED — AB
Mycoplasma pneumoniae: NOT DETECTED
Parainfluenza Virus 1: NOT DETECTED
Parainfluenza Virus 2: NOT DETECTED
Parainfluenza Virus 3: NOT DETECTED
Parainfluenza Virus 4: NOT DETECTED
Respiratory Syncytial Virus: NOT DETECTED
Rhinovirus / Enterovirus: NOT DETECTED

## 2018-04-29 LAB — TSH: TSH: 0.26 u[IU]/mL — ABNORMAL LOW (ref 0.350–4.500)

## 2018-04-29 LAB — INFLUENZA PANEL BY PCR (TYPE A & B)
Influenza A By PCR: NEGATIVE
Influenza B By PCR: NEGATIVE

## 2018-04-29 LAB — CBC
HCT: 39.2 % (ref 39.0–52.0)
Hemoglobin: 11.9 g/dL — ABNORMAL LOW (ref 13.0–17.0)
MCH: 31.2 pg (ref 26.0–34.0)
MCHC: 30.4 g/dL (ref 30.0–36.0)
MCV: 102.6 fL — ABNORMAL HIGH (ref 80.0–100.0)
Platelets: 74 10*3/uL — ABNORMAL LOW (ref 150–400)
RBC: 3.82 MIL/uL — AB (ref 4.22–5.81)
RDW: 15.2 % (ref 11.5–15.5)
WBC: 2.5 10*3/uL — AB (ref 4.0–10.5)
nRBC: 0 % (ref 0.0–0.2)

## 2018-04-29 LAB — COMPREHENSIVE METABOLIC PANEL
ALT: 18 U/L (ref 0–44)
AST: 32 U/L (ref 15–41)
Albumin: 3.4 g/dL — ABNORMAL LOW (ref 3.5–5.0)
Alkaline Phosphatase: 61 U/L (ref 38–126)
Anion gap: 9 (ref 5–15)
BUN: 30 mg/dL — ABNORMAL HIGH (ref 8–23)
CO2: 23 mmol/L (ref 22–32)
Calcium: 8.1 mg/dL — ABNORMAL LOW (ref 8.9–10.3)
Chloride: 110 mmol/L (ref 98–111)
Creatinine, Ser: 1.48 mg/dL — ABNORMAL HIGH (ref 0.61–1.24)
GFR, EST AFRICAN AMERICAN: 51 mL/min — AB (ref >60)
GFR, EST NON AFRICAN AMERICAN: 44 mL/min — AB (ref >60)
Glucose, Bld: 135 mg/dL — ABNORMAL HIGH (ref 70–99)
Potassium: 5.4 mmol/L — ABNORMAL HIGH (ref 3.5–5.1)
Sodium: 142 mmol/L (ref 135–145)
Total Bilirubin: 0.8 mg/dL (ref 0.3–1.2)
Total Protein: 6.9 g/dL (ref 6.5–8.1)

## 2018-04-29 LAB — TROPONIN I
TROPONIN I: 0.04 ng/mL — AB (ref <0.03)
Troponin I: 0.04 ng/mL (ref <0.03)

## 2018-04-29 LAB — POTASSIUM: Potassium: 5.1 mmol/L (ref 3.5–5.1)

## 2018-04-29 LAB — HIV ANTIBODY (ROUTINE TESTING W REFLEX): HIV Screen 4th Generation wRfx: NONREACTIVE

## 2018-04-29 LAB — MAGNESIUM: MAGNESIUM: 2.1 mg/dL (ref 1.7–2.4)

## 2018-04-29 LAB — MRSA PCR SCREENING: MRSA by PCR: NEGATIVE

## 2018-04-29 LAB — PHOSPHORUS: Phosphorus: 4.9 mg/dL — ABNORMAL HIGH (ref 2.5–4.6)

## 2018-04-29 MED ORDER — ENSURE ENLIVE PO LIQD: 237.0000 mL | Freq: Two times a day (BID) | ORAL | Status: DC

## 2018-04-29 MED ORDER — HYDRALAZINE HCL 20 MG/ML IJ SOLN
5.0000 mg | Freq: Four times a day (QID) | INTRAMUSCULAR | Status: DC | PRN
  Administered 2018-04-29 – 2018-04-30 (×3): 5 mg via INTRAVENOUS
  Filled 2018-04-29 (×3): qty 1

## 2018-04-29 MED ORDER — VITAMIN B-1 100 MG PO TABS
100.0000 mg | ORAL_TABLET | Freq: Every day | ORAL | Status: DC
  Administered 2018-04-30 – 2018-05-02 (×3): 100 mg via ORAL
  Filled 2018-04-29 (×3): qty 1

## 2018-04-29 MED ORDER — ADULT MULTIVITAMIN W/MINERALS CH
1.0000 | ORAL_TABLET | Freq: Every day | ORAL | Status: DC
  Administered 2018-04-29 – 2018-05-02 (×4): 1 via ORAL
  Filled 2018-04-29 (×4): qty 1

## 2018-04-29 MED ORDER — PREDNISONE 20 MG PO TABS
20.0000 mg | ORAL_TABLET | Freq: Two times a day (BID) | ORAL | Status: DC
  Administered 2018-04-29 – 2018-05-02 (×6): 20 mg via ORAL
  Filled 2018-04-29 (×6): qty 1

## 2018-04-29 MED ORDER — SODIUM CHLORIDE 0.9 % IV SOLN
INTRAVENOUS | Status: DC
  Administered 2018-04-29 – 2018-04-30 (×3): via INTRAVENOUS

## 2018-04-29 MED ORDER — GUAIFENESIN 100 MG/5ML PO SOLN: 5.0000 mL | ORAL | Status: DC | PRN

## 2018-04-29 MED ORDER — BOOST / RESOURCE BREEZE PO LIQD CUSTOM
1.0000 | Freq: Three times a day (TID) | ORAL | Status: DC
  Administered 2018-04-29 – 2018-05-02 (×9): 1 via ORAL

## 2018-04-29 MED ORDER — METHYLPREDNISOLONE SODIUM SUCC 125 MG IJ SOLR: 60.0000 mg | Freq: Three times a day (TID) | INTRAMUSCULAR | Status: DC

## 2018-04-29 MED ORDER — IPRATROPIUM-ALBUTEROL 0.5-2.5 (3) MG/3ML IN SOLN
3.0000 mL | RESPIRATORY_TRACT | Status: DC
  Administered 2018-04-29 (×4): 3 mL via RESPIRATORY_TRACT
  Filled 2018-04-29 (×5): qty 3

## 2018-04-29 NOTE — Evaluation (Signed)
Physical Therapy Evaluation Patient Details Name: [name] MRN: [id number] DOB: Jun 08, 1940 Today's Date: 04/29/2018   History of Present Illness  78 y.o. 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 A. Fib,HLD, PAD, remote history of seizures, tobacco abuse, brain aneurysm status post clipping and admitted for acute COPD exacerbation.  Clinical Impression  Pt admitted with above diagnosis. Pt currently with functional limitations due to the deficits listed below (see PT Problem List).  Pt will benefit from skilled PT to increase their independence and safety with mobility to allow discharge to the venue listed below.  Pt requiring UE support and steadying assist for mobility today however pt very pleased to be OOB and ambulating.  Pt reports being independent at home however also states niece checks in on him frequently.  Recommend SNF at this time if 24/7 assist not available at home.     Follow Up Recommendations Supervision/Assistance - 24 hour;SNF(home if 24/7 assist available, if not then SNF)    Equipment Recommendations  None recommended by PT    Recommendations for Other Services       Precautions / Restrictions Precautions Precautions: Fall Precaution Comments: chronic oxygen      Mobility  Bed Mobility Overal bed mobility: Needs Assistance Bed Mobility: Supine to Sit     Supine to sit: Min guard;HOB elevated     General bed mobility comments: increased time and effort however no physical assist required  Transfers Overall transfer level: Needs assistance Equipment used: 1 person hand held assist Transfers: Sit to/from Stand Sit to Stand: Min assist;+2 safety/equipment         General transfer comment: assist to steady with rise, provided a hand for more support (pt declined using assistive device however upon standing stated he needed more UE  support)  Ambulation/Gait Ambulation/Gait assistance: Min guard;+2 safety/equipment Gait Distance (Feet): 160 Feet Assistive device: 2 person hand held assist Gait Pattern/deviations: Step-through pattern;Decreased stride length     General Gait Details: slow but steady with bil UE support, remained on 3L O2 [place] and SPO2 96-97% however RR in 30s at times so therapist limited distance  Stairs            Wheelchair Mobility    Modified Rankin (Stroke Patients Only)       Balance Overall balance assessment: Mild deficits observed, not formally tested;Needs assistance         Standing balance support: Bilateral upper extremity supported Standing balance-Leahy Scale: Poor Standing balance comment: requires UE support at this time                             Pertinent Vitals/Pain Pain Assessment: No/denies pain    Home Living Family/patient expects to be discharged to:: Private residence Living Arrangements: (brother) Available Help at Discharge: Family;Available PRN/intermittently(pt reports his niece checks on him daily) Type of Home: House       Home Layout: One level Home Equipment: Walker - 2 wheels;Cane - single point      Prior Function Level of Independence: Independent with assistive device(s)         Comments: per pt, he is independent and niece appears to help with IADLs?     Hand Dominance        Extremity/Trunk Assessment        Lower Extremity Assessment Lower Extremity Assessment: Generalized weakness  Communication   Communication: HOH  Cognition Arousal/Alertness: Awake/alert Behavior During Therapy: WFL for tasks assessed/performed Overall Cognitive Status: Within Functional Limits for tasks assessed                                 General Comments: pt states, "I'm crazy"      General Comments      Exercises     Assessment/Plan    PT Assessment Patient needs continued PT services  PT  Problem List Decreased strength;Decreased mobility;Decreased activity tolerance;Decreased balance;Decreased knowledge of use of DME;Cardiopulmonary status limiting activity;Decreased safety awareness       PT Treatment Interventions Functional mobility training;DME instruction;Gait training;Therapeutic activities;Balance training;Neuromuscular re-education;Patient/family education;Therapeutic exercise    PT Goals (Current goals can be found in the Care Plan section)  Acute Rehab PT Goals PT Goal Formulation: With patient Time For Goal Achievement: 05/13/18 Potential to Achieve Goals: Good    Frequency Min 3X/week   Barriers to discharge        Co-evaluation               AM-PAC PT "6 Clicks" Mobility  Outcome Measure Help needed turning from your back to your side while in a flat bed without using bedrails?: A Little Help needed moving from lying on your back to sitting on the side of a flat bed without using bedrails?: A Little Help needed moving to and from a bed to a chair (including a wheelchair)?: A Little Help needed standing up from a chair using your arms (e.g., wheelchair or bedside chair)?: A Little Help needed to walk in hospital room?: A Little Help needed climbing 3-5 steps with a railing? : A Lot 6 Click Score: 17    End of Session Equipment Utilized During Treatment: Gait belt;Oxygen Activity Tolerance: Patient tolerated treatment well Patient left: in chair;with chair alarm set;with call bell/phone within reach   PT Visit Diagnosis: Other abnormalities of gait and mobility (R26.89)    Time: 0093-8182 PT Time Calculation (min) (ACUTE ONLY): 17 min   Charges:   PT Evaluation $PT Eval Low Complexity: [place], PT, DPT Acute Rehabilitation Services Office: [phone] Pager: [phone]  Trena Platt 04/29/2018, 3:33 PM

## 2018-04-29 NOTE — Progress Notes (Signed)
Pt thought to have ST elevation on monitor.  12 lead EKG performed - Results unremarkable, No ST elevation or T wave inversion noted.  E link notified - No new orders

## 2018-04-29 NOTE — Progress Notes (Signed)
Initial Nutrition Assessment  DOCUMENTATION CODES:   Non-severe (moderate) malnutrition in context of chronic illness  INTERVENTION:  - Will order Boost Breeze TID, each supplement provides 250 kcal and 9 grams of protein. - Will order daily multivitamin with minerals. - Continue to encourage PO intakes.    NUTRITION DIAGNOSIS:   Moderate Malnutrition related to chronic illness(COPD) as evidenced by mild muscle depletion, mild fat depletion.  GOAL:   Patient will meet greater than or equal to 90% of their needs  MONITOR:   PO intake, Supplement acceptance, Weight trends, Labs  REASON FOR ASSESSMENT:   Consult Assessment of nutrition requirement/status  ASSESSMENT:   78 y.o. male with medical history significant of COPD, dementia, HTN, hx of ETOH abuse, anemia d/t myelodysplasia, CKD, A. Fib, HLD, PAD, remote history of seizures, tobacco abuse, brain aneurysm s/p clipping. He presented with cough which family reported has been ongoing x2 months, generalized weakness, and not being himself. On arrival to ED he was found to be hypoxic.  BMI indicates normal weight. Per chart review patient consumed 25% of breakfast this AM. RN reports that he had sausage, eggs, cereal, and toast. Patient confirms this and that he ate <50% of meal. Patient denies any abdominal pain or nausea at this time. Noted several missing teeth. Patient was seen by SLP shortly before RD visit and SLP reports that plan is to continue regular consistency for foods and thin liquids.   Patient reports good appetite PTA and that he usually eats 3 meals/day. He greatly enjoys fish and chicken. Per chart review, current weight is 119 lb and weight on 12/31/17 was 134 lb indicating 15 lb weight loss (12% body weight) in 4 months; significant for time frame.   Medications reviewed; 125 mg solu-medrol x1 dose 1/22, 20 mg deltasone BID, 100 mg IV thiamine/day.  Labs reviewed; K: 5.4 mmol/l, BUN: 30 mg/dl, creatinine: 1.48  mg/dl, Ca: 8.1 mg/dl, Phos: 4.9 mg/dl, GFR: 51 ml/min. IVF; NS @ 75 ml/hr.      NUTRITION - FOCUSED PHYSICAL EXAM:    Most Recent Value  Orbital Region  No depletion  Upper Arm Region  Mild depletion  Thoracic and Lumbar Region  Unable to assess  Buccal Region  Mild depletion  Temple Region  Mild depletion  Clavicle Bone Region  Mild depletion  Clavicle and Acromion Bone Region  Mild depletion  Scapular Bone Region  Unable to assess  Dorsal Hand  Mild depletion  Patellar Region  Mild depletion  Anterior Thigh Region  Mild depletion  Posterior Calf Region  Mild depletion  Edema (RD Assessment)  None  Hair  Reviewed  Eyes  Reviewed  Mouth  Reviewed  Skin  Reviewed  Nails  Reviewed       Diet Order:   Diet Order            Diet Heart Room service appropriate? Yes; Fluid consistency: Thin  Diet effective now              EDUCATION NEEDS:   No education needs have been identified at this time  Skin:  Skin Assessment: Reviewed RN Assessment  Last BM:  PTA/unknown  Height:   Ht Readings from Last 1 Encounters:  04/28/18 5\' 6"  (1.676 m)    Weight:   Wt Readings from Last 1 Encounters:  04/28/18 53.8 kg    Ideal Body Weight:  64.54 kg  BMI:  Body mass index is 19.14 kg/m.  Estimated Nutritional Needs:   Kcal:  9794-8016 kcal  Protein:  65-80 grams  Fluid:  >/= 1.8 L/day     Jarome Matin, MS, RD, LDN, [facility] Inpatient Clinical Dietitian Pager # [phone] After hours/weekend pager # [phone]

## 2018-04-29 NOTE — Progress Notes (Signed)
ABI/TBI exam completed.  Data from ABI machine cannot transfer exam results to syngo.  I had called IT service, they will fix it later.   ABI/TBI Result summery manually put on here:  ABI/TBI    Right 0.62/0.30   -----Indicates  Resting right ankle-brachial index moderate right lower extremity arterial disease with abnormal toes arterial pattern.  Left  0.46/0.21    ----Resting left ankle-brachial index indicates severe left lower extremity arterial disease with abnormal toes arterial pattern.   Bilateral lower extremities doppler waveforms are monophasic.  Caz Weaver H Shreya Lacasse(RDMS RVT) 04/29/18 '3:45 PM

## 2018-04-29 NOTE — Evaluation (Signed)
Occupational Therapy Evaluation Patient Details Name: [name] MRN: [id number] DOB: Dec 24, 1940 Today's Date: 04/29/2018    History of Present Illness 78 y.o. 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 A. Fib,HLD, PAD, remote history of seizures, tobacco abuse, brain aneurysm status post clipping and admitted for acute COPD exacerbation.   Clinical Impression   Pt was admitted for the above. At baseline, he reports he is independent/mod I with adls.  No family available to confirm.  Pt lives with brother. He currently needs min A for adls and min guard for SPT. Will follow in acute with supervision level goals    Follow Up Recommendations  Supervision/Assistance - 24 hour    Equipment Recommendations  (tba further)    Recommendations for Other Services       Precautions / Restrictions Precautions Precautions: Fall Precaution Comments: chronic oxygen Restrictions Weight Bearing Restrictions: No      Mobility Bed Mobility Overal bed mobility: Needs Assistance Bed Mobility: Supine to Sit     Supine to sit: Min guard;HOB elevated     General bed mobility comments: min guard for back to bed  Transfers Overall transfer level: Needs assistance Equipment used: 1 person hand held assist Transfers: Sit to/from Stand Sit to Stand: Min guard         General transfer comment: min guard transfer from chair to bed using bedrail for support    Balance Overall balance assessment: Mild deficits observed, not formally tested;Needs assistance         Standing balance support: Bilateral upper extremity supported Standing balance-Leahy Scale: Poor Standing balance comment: requires UE support at this time                           ADL either performed or assessed with clinical judgement   ADL Overall ADL's : Needs assistance/impaired             Lower Body Bathing: Minimal  assistance;Sit to/from stand       Lower Body Dressing: Minimal assistance;Sit to/from stand   Toilet Transfer: Min Designer, jewellery Details (indicate cue type and reason): chair to bed           General ADL Comments: pt performs UB adls with set up.  Min A for LB adls. He tends to bend forward but is able to cross legs. Started energy conservation education.  Pt focused on not having any trouble prior to admission     Vision         Perception     Praxis      Pertinent Vitals/Pain Pain Assessment: No/denies pain     Hand Dominance     Extremity/Trunk Assessment Upper Extremity Assessment Upper Extremity Assessment: Generalized weakness   Lower Extremity Assessment Lower Extremity Assessment: Generalized weakness       Communication Communication Communication: HOH   Cognition Arousal/Alertness: Awake/alert Behavior During Therapy: WFL for tasks assessed/performed Overall Cognitive Status: No family available.  H/o dementia. Follows all commands; decreased awareness of deficits                                  General Comments: pt states, "I'm crazy"   General Comments  vss    Exercises     Shoulder Instructions      Home Living Family/patient expects to  be discharged to:: Private residence Living Arrangements: Other relatives Available Help at Discharge: Family;Available PRN/intermittently Type of Home: House       Home Layout: One level     Bathroom Shower/Tub: Occupational psychologist: Standard     Home Equipment: Environmental consultant - 2 wheels;Cane - single point          Prior Functioning/Environment Level of Independence: Independent with assistive device(s)        Comments: per pt, he is independent and niece appears to help with IADLs?        OT Problem List: Decreased strength;Decreased activity tolerance;Impaired balance (sitting and/or standing);Decreased knowledge of use of DME or AE       OT Treatment/Interventions: Self-care/ADL training;Energy conservation;DME and/or AE instruction;Patient/family education;Therapeutic activities;Balance training    OT Goals(Current goals can be found in the care plan section) Acute Rehab OT Goals Patient Stated Goal: home OT Goal Formulation: With patient Time For Goal Achievement: 05/13/18 Potential to Achieve Goals: Good ADL Goals Pt Will Transfer to Toilet: with supervision;ambulating;regular height toilet Pt Will Perform Tub/Shower Transfer: Shower transfer;ambulating;with min guard assist(seat?) Additional ADL Goal #1: pt will complete adls with set up supervision including retrieval  OT Frequency: Min 2X/week   Barriers to D/C:            Co-evaluation              AM-PAC OT "6 Clicks" Daily Activity     Outcome Measure Help from another person eating meals?: None Help from another person taking care of personal grooming?: A Little Help from another person toileting, which includes using toliet, bedpan, or urinal?: A Little Help from another person bathing (including washing, rinsing, drying)?: A Little Help from another person to put on and taking off regular upper body clothing?: A Little Help from another person to put on and taking off regular lower body clothing?: A Little 6 Click Score: 19   End of Session    Activity Tolerance: Patient tolerated treatment well Patient left: in chair;with call bell/phone within reach(with Echo tech)  OT Visit Diagnosis: Muscle weakness (generalized) (M62.81)                Time: 9758-8325 OT Time Calculation (min): 20 min Charges:  OT General Charges $OT Visit: 1 Visit OT Evaluation $OT Eval Low Complexity: 1 Low  Lesle Chris, OTR/L Acute Rehabilitation Services [phone] [facility] pager [phone] office 04/29/2018  [place] 04/29/2018, 4:10 PM

## 2018-04-29 NOTE — Progress Notes (Signed)
Pt not on BIPAP at this time. No distress noted.  ?

## 2018-04-29 NOTE — Progress Notes (Signed)
[facility] PROGRESS NOTE  [name] POE:[id number] DOB: [birth year] DOA: 04/28/2018  PCP: [name], NP  Brief History/Interval Summary: 78 y.o. 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 A. Fib,HLD, PAD, history of seizures (remote, not in the past 15 years), tobacco abuse, brain aneurysm status post clipping entered with cough shortness of breath.  He was seen initially by his primary care provider and it appears that he was given antibiotics and steroids.  There was no improvement in his symptoms so he was brought into the hospital.  In the emergency department he was found to have acute hypercapnic respiratory failure.  He was placed on BiPAP and was hospitalized.   Reason for Visit: Acute respiratory failure with hypercapnia.  Acute COPD exacerbation.  Consultants: Pulmonology  Procedures: None  Antibiotics: Doxycycline  Subjective/Interval History: Patient noted to be on BiPAP this morning.  He is awake alert.  States that he is feeling better.  Still has a cough.  Requesting to be taken off of BiPAP.  ROS: Denies any chest pain.  Objective:  Vital Signs  Vitals:   04/29/18 0700 04/29/18 0800 04/29/18 0900 04/29/18 1000  BP: (!) 115/52 (!) 108/34 (!) 159/51 (!) 163/106  Pulse: 66 69 66 86  Resp: (!) 25 (!) 23 (!) 23 (!) 35  Temp:  97.9 F (36.6 C)    TempSrc:  Oral    SpO2: 97% 96% 98% 96%  Weight:      Height:        Intake/Output Summary (Last 24 hours) at 04/29/2018 1107 Last data filed at 04/29/2018 1037 Gross per 24 hour  Intake 2071.65 ml  Output 175 ml  Net 1896.65 ml   Filed Weights   04/28/18 1359 04/28/18 2030  Weight: 59 kg 53.8 kg    General appearance: alert, cooperative, appears stated age and no distress Head: Normocephalic, without obvious abnormality, atraumatic Resp: Mildly tachypneic at rest.  Coarse breath sounds bilaterally.  Scattered wheezes.   No definite rhonchi or crackles. Cardio: regular rate and rhythm, S1, S2 normal, no murmur, click, rub or gallop GI: soft, non-tender; bowel sounds normal; no masses,  no organomegaly Extremities: extremities normal, atraumatic, no cyanosis or edema Neurologic: No obvious focal neurological deficits.  Lab Results:  Data Reviewed: I have personally reviewed following labs and imaging studies  CBC: Recent Labs  Lab 04/28/18 1549 04/29/18 0342  WBC 5.4 2.5*  NEUTROABS 3.1  --   HGB 13.0 11.9*  HCT 42.9 39.2  MCV 102.6* 102.6*  PLT 100* 74*    Basic Metabolic Panel: Recent Labs  Lab 04/28/18 1549 04/29/18 0342 04/29/18 0900  NA 142 142  --   K 5.3* 5.4* 5.1  CL 107 110  --   CO2 26 23  --   GLUCOSE 129* 135*  --   BUN 23 30*  --   CREATININE 1.32* 1.48*  --   CALCIUM 8.8* 8.1*  --   MG  --  2.1  --   PHOS  --  4.9*  --     GFR: Estimated Creatinine Clearance: 30.8 mL/min (A) (by C-G formula based on SCr of 1.48 mg/dL (H)).  Liver Function Tests: Recent Labs  Lab 04/28/18 1549 04/29/18 0342  AST 32 32  ALT 18 18  ALKPHOS 72 61  BILITOT 0.4 0.8  PROT 8.1 6.9  ALBUMIN 4.2 3.4*    Coagulation Profile: Recent Labs  Lab 04/28/18 2129  INR 0.94    Cardiac Enzymes: Recent Labs  Lab 04/28/18 1549 04/28/18 2125 04/29/18 0342 04/29/18 0900  TROPONINI <0.03 0.04* 0.04* 0.04*    Thyroid Function Tests: Recent Labs    04/29/18 0342  TSH 0.260*    Anemia Panel: No results for input(s): VITAMINB12, FOLATE, FERRITIN, TIBC, IRON, RETICCTPCT in the last 72 hours.  Recent Results (from the past 240 hour(s))  MRSA PCR Screening     Status: None   Collection Time: 04/28/18  9:02 PM  Result Value Ref Range Status   MRSA by PCR NEGATIVE NEGATIVE Final    Comment:        The GeneXpert MRSA Assay (FDA approved for NASAL specimens only), is one component of a comprehensive MRSA colonization surveillance program. It is not intended to diagnose  MRSA infection nor to guide or monitor treatment for MRSA infections. Performed at [facility], [place] [address] [place], [place] [address]   Respiratory Panel by PCR     Status: Abnormal   Collection Time: 04/29/18 12:30 AM  Result Value Ref Range Status   Adenovirus NOT DETECTED NOT DETECTED Final   Coronavirus 229E NOT DETECTED NOT DETECTED Final   Coronavirus HKU1 NOT DETECTED NOT DETECTED Final   Coronavirus NL63 NOT DETECTED NOT DETECTED Final   Coronavirus OC43 NOT DETECTED NOT DETECTED Final   Metapneumovirus DETECTED (A) NOT DETECTED Final   Rhinovirus / Enterovirus NOT DETECTED NOT DETECTED Final   Influenza A NOT DETECTED NOT DETECTED Final   Influenza B NOT DETECTED NOT DETECTED Final   Parainfluenza Virus 1 NOT DETECTED NOT DETECTED Final   Parainfluenza Virus 2 NOT DETECTED NOT DETECTED Final   Parainfluenza Virus 3 NOT DETECTED NOT DETECTED Final   Parainfluenza Virus 4 NOT DETECTED NOT DETECTED Final   Respiratory Syncytial Virus NOT DETECTED NOT DETECTED Final   Bordetella pertussis NOT DETECTED NOT DETECTED Final   Chlamydophila pneumoniae NOT DETECTED NOT DETECTED Final   Mycoplasma pneumoniae NOT DETECTED NOT DETECTED Final    Comment: Performed at [facility], [place] [address], [place], [place] [address]      Radiology Studies: Ct Head Wo Contrast  Result Date: 04/28/2018 CLINICAL DATA:  Altered mental status EXAM: CT HEAD WITHOUT CONTRAST TECHNIQUE: Contiguous axial images were obtained from the base of the skull through the vertex without intravenous contrast. COMPARISON:  Head CT 08/14/2016 FINDINGS: Brain: There is no mass, hemorrhage or extra-axial collection. There is generalized atrophy without lobar predilection. Hypodensity of the white matter is most commonly associated with chronic microvascular disease. Right frontal and anterior left temporal encephalomalacia is unchanged. Vascular: Aneurysm clip adjacent to the left carotid  terminus. Skull: Old left pterional craniotomy and right frontal craniotomy. Sinuses/Orbits: No fluid levels or advanced mucosal thickening of the visualized paranasal sinuses. No mastoid or middle ear effusion. The orbits are normal. IMPRESSION: Unchanged examination without acute intracranial abnormality. Electronically Signed   By: Ulyses Jarred M.D.   On: 04/28/2018 20:40   Dg Chest Port 1 View  Result Date: 04/28/2018 CLINICAL DATA:  Cough.  History of hypertension and COPD. EXAM: PORTABLE CHEST 1 VIEW COMPARISON:  Radiographs 08/03/2017.  CT 10/17/2016. FINDINGS: 1547 hours. Two AP views obtained. The heart size and mediastinal contours are stable. There is mild cardiomegaly with atherosclerosis of the aorta and great vessels. The lungs are clear. There is no pleural effusion or pneumothorax. IVC filter noted. IMPRESSION: Stable chest.  No active cardiopulmonary process. Electronically  Signed   By: Richardean Sale M.D.   On: 04/28/2018 16:06     Medications:  Scheduled: . atorvastatin  20 mg Oral Daily  . chlorhexidine  15 mL Mouth Rinse BID  . divalproex  250 mg Oral TID  . doxycycline  100 mg Oral Q12H  . feeding supplement  1 Container Oral TID BM  . guaiFENesin  600 mg Oral BID  . hydrALAZINE  50 mg Oral BID  . ipratropium-albuterol  3 mL Nebulization Q4H WA  . levETIRAcetam  500 mg Oral BID  . mouth rinse  15 mL Mouth Rinse q12n4p  . memantine  10 mg Oral BID  . multivitamin with minerals  1 tablet Oral Daily  . predniSONE  20 mg Oral BID WC  . thiamine injection  100 mg Intravenous Daily   Continuous: . sodium chloride 75 mL/hr at 04/29/18 1037   NID:[id number] **OR** acetaminophen, levalbuterol, ondansetron **OR** ondansetron (ZOFRAN) IV    Assessment/Plan:  Acute respiratory failure with hypercapnia and hypoxia This is secondary to COPD exacerbation.  Patient is awake alert this morning.  He seems to have improved.  He could be taken off of BiPAP.  We will continue  with IV steroids for now.  Continue with doxycycline.  Nebulizer treatments.  Pulmonology consulted.  Acute COPD exacerbation/metapneumovirus Management as outlined above.  Patient seems to be improving.  Titrate down oxygen.  Influenza PCR was negative.  Respiratory viral panel for metapneumovirus.  Continue droplet precautions.  Essential hypertension Monitor blood pressures closely.  ARB on hold due to elevated potassium level.  Hydralazine as needed.  Chronic diastolic CHF Currently appears to be euvolemic.  Echocardiogram from 2018 showed normal systolic function.  Grade 2 diastolic dysfunction was noted.  Thrombocytopenia Reason for low platelet count not entirely clear.  Noted to be lower today compared to yesterday.  Avoid heparin products.  Continue to monitor labs.  No evidence of bleeding currently.  Acute renal failure with hyperkalemia BUN and creatinine noted to be slightly higher today.  Continue with IV fluids.  Potassium was also noted to be high but improved on recheck.  Holding ARB.  Monitor urine output.  Macrocytic anemia Evidence for bleeding.  Check anemia panel including B12 folate.  TSH noted to be 0.26.  TSH can be rechecked in few weeks time.  History of peripheral vascular disease Does not appear to be an active issue currently.  It appears that ABI has been ordered.  History of alcohol abuse Unclear if this is current or not.  No evidence for withdrawal currently.  Thiamine.  History of seizure disorder Continue Keppra.  History of dementia Stable.  Continue Depakote.   DVT Prophylaxis: SCDs    Code Status: Full code Family Communication: Discussed with the patient.  No family at bedside Disposition Plan: Management as outlined above.    LOS: 1 day   Necola Bluestein [employer] on [email or web address]  04/29/2018, 11:07 AM

## 2018-04-29 NOTE — Evaluation (Signed)
Clinical/Bedside Swallow Evaluation Patient Details  Name: [name] MRN: [id number] Date of Birth: 1940-09-13  Today's Date: 04/29/2018 Time: SLP Start Time (ACUTE ONLY): 1024 SLP Stop Time (ACUTE ONLY): 1037 SLP Time Calculation (min) (ACUTE ONLY): 13 min  Past Medical History:  Past Medical History:  Diagnosis Date  . Alcohol abuse   . Anemia   . Aneurysm ([place])   . CKD (chronic kidney disease), stage III ([place])   . COPD (chronic obstructive pulmonary disease) ([place])   . Dysrhythmia   . Fever of unknown origin 08/2016  . History of atrial fibrillation   . Hyperchloremia   . Hypercholesteremia   . Hyperpotassemia   . Hypertension   . Hypertensive renal disease, benign   . Leg edema   . PAD (peripheral artery disease) ([place])   . Pneumonia   . Pressure ulcer of left heel   . Seizures ([place])    has not had a seizure in 15 yrs  . Tobacco use   . Weight loss    Past Surgical History:  Past Surgical History:  Procedure Laterality Date  . ABDOMINAL AORTOGRAM W/LOWER EXTREMITY N/A 08/13/2016   Procedure: Abdominal Aortogram w/Lower Extremity;  Surgeon: Wellington Hampshire, MD;  Location: [facility];  Service: Cardiovascular;  Laterality: N/A;  . 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, 1996  . EYE SURGERY     December 2017  . PERIPHERAL VASCULAR BALLOON ANGIOPLASTY  08/13/2016   Procedure: Peripheral Vascular Balloon Angioplasty;  Surgeon: Wellington Hampshire, MD;  Location: [facility];  Service: Cardiovascular;;  Aborted   HPI:  [name] is a 78 y.o. 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 Pt admitted with cough x 2 months. In ER found to be hypoxic. Per family pt was dragging his right leg day of admission. Head CT no acute intracranial abnormality. CXR Stable chest.  No active cardiopulmonary process. MD  diagnosed COPD exacerbation. RN noted test just returned positive for human metapneumovirus.   Assessment / Plan / Recommendation Clinical Impression  Pt with congestion at baseline and coughing as therapist entered room. He denies difficulty with swallowing or coordinating swallow and respirations. Review of chart past year did not yield evidence of previous pneumonias. Mild coughing and congestion continued but did not increase during assessment. Mastication and transit of solid was prolonged however pt states he does not texture altered or meats chopped (most of the time he does not wear dentures while eating per pt). Educated pt to eat at a slow rate and take rest breaks when needed, sitting in upright position. Recommend continue regular texture, thin liquids and pills with thin and swallow precautions. If pt is admitted in the future with bacterial pna, would recommend MBS at that time. ST will sign off.  SLP Visit Diagnosis: Dysphagia, unspecified (R13.10)    Aspiration Risk  Mild aspiration risk;Moderate aspiration risk    Diet Recommendation Regular;Thin liquid   Liquid Administration via: Cup;Straw Medication Administration: Whole meds with liquid Supervision: Patient able to self feed Compensations: Slow rate;Small sips/bites(rest breaks if needed) Postural Changes: Seated upright at 90 degrees    Other  Recommendations Oral Care Recommendations: Oral care BID   Follow up Recommendations None      Frequency and Duration            Prognosis  Swallow Study   General HPI: [name] is a 78 y.o. 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 Pt admitted with cough x 2 months. In ER found to be hypoxic. Per family pt was dragging his right leg day of admission. Head CT no acute intracranial abnormality. CXR  Stable chest.  No active cardiopulmonary process. MD diagnosed COPD exacerbation. RN noted test just returned positive for human metapneumovirus. Type of Study: Bedside Swallow Evaluation Previous Swallow Assessment: (none) Diet Prior to this Study: Regular;Thin liquids Temperature Spikes Noted: No Respiratory Status: Nasal cannula History of Recent Intubation: No Behavior/Cognition: Alert;Cooperative;Pleasant mood Oral Cavity Assessment: Within Functional Limits Oral Care Completed by SLP: No Oral Cavity - Dentition: Poor condition;Missing dentition(several teeth total) Vision: Functional for self-feeding Self-Feeding Abilities: Able to feed self Patient Positioning: Upright in bed Baseline Vocal Quality: Normal Volitional Cough: Strong Volitional Swallow: Able to elicit    Oral/Motor/Sensory Function Overall Oral Motor/Sensory Function: Within functional limits   Ice Chips Ice chips: Not tested   Thin Liquid Thin Liquid: Impaired Presentation: Cup;Straw Pharyngeal  Phase Impairments: Cough - Delayed    Nectar Thick Nectar Thick Liquid: Not tested   Honey Thick Honey Thick Liquid: Not tested   Puree Puree: Within functional limits   Solid     Solid: Impaired Oral Phase Impairments: Impaired mastication Oral Phase Functional Implications: Prolonged oral transit      [name] 04/29/2018,10:54 AM    [name].Ed Risk analyst [phone] Office [phone]

## 2018-04-29 NOTE — Progress Notes (Signed)
[facility] Progress Note Patient Name: [name] DOB: 1940-09-16 MRN: [id number]   Date of Service  04/29/2018  HPI/Events of Note  K+ 5.4  eICU Interventions  Repeat K+ at 9:00 AM        Maydell Knoebel U Caitlin Ainley 04/29/2018, 5:01 AM

## 2018-04-29 NOTE — Consult Note (Signed)
NAME:  [name], MRN:  [id number], DOB:  07/13/1940, LOS: 1 ADMISSION DATE:  04/28/2018, CONSULTATION DATE: 04/29/2018 REFERRING MD: Dr. Roel Cluck, CHIEF COMPLAINT: Shortness of breath/cough  Brief History   Patient presented to the hospital with a few days of persistent cough Has been coughing for at least a couple of months Was treated with a course of antibiotics and steroids-finish the course but with nonresolution Progressive symptoms led to presentation to the hospital He has a history of COPD, dementia, hypertension, history of EtOH abuse  History of present illness   Progressive symptoms despite completing course of antibiotics and steroids recently Significant shortness of breath led to presentation to the hospital He has a known history of advanced chronic obstructive pulmonary disease History of grade 2 diastolic heart failure  Past Medical History   Past Medical History:  Diagnosis Date  . Alcohol abuse   . Anemia   . Aneurysm ([place])   . CKD (chronic kidney disease), stage III ([place])   . COPD (chronic obstructive pulmonary disease) ([place])   . Dysrhythmia   . Fever of unknown origin 08/2016  . History of atrial fibrillation   . Hyperchloremia   . Hypercholesteremia   . Hyperpotassemia   . Hypertension   . Hypertensive renal disease, benign   . Leg edema   . PAD (peripheral artery disease) ([place])   . Pneumonia   . Pressure ulcer of left heel   . Seizures ([place])    has not had a seizure in 15 yrs  . Tobacco use   . Weight loss    Significant Hospital Events   Improvement in sinus symptoms since being in the hospital He was started on BiPAP-improvement in hypercapnic failure and mental status  Consults:  pccm 04/29/2018  Procedures:  none  Significant Diagnostic Tests:  Chest x-ray on 04/28/2018, reviewed by myself shows no acute infiltrate  Micro Data:   Respiratory viral panel positive for metapneumovirus MRSA by PCR negative Influenza PCR  negative  Antimicrobials:  Doxycycline1/22/2020>>  Interim history/subjective:  Was on BiPAP yesterday Currently on oxygen supplementation Awake and alert, some confusion-probably baseline  Objective   Blood pressure (!) 108/34, pulse 69, temperature 97.9 F (36.6 C), temperature source Oral, resp. rate (!) 23, height 5\' 6"  (1.676 m), weight 53.8 kg, SpO2 96 %.    FiO2 (%):  [30 %] 30 %   Intake/Output Summary (Last 24 hours) at 04/29/2018 0926 Last data filed at 04/29/2018 0827 Gross per 24 hour  Intake 1909.29 ml  Output 175 ml  Net 1734.29 ml   Filed Weights   04/28/18 1359 04/28/18 2030  Weight: 59 kg 53.8 kg    Examination: General: Awake and alert, oriented to person place HENT: Moist oral mucosa Lungs: decreased, no rales Cardiovascular: S1-S2 appreciated Abdomen: Bowel sounds appreciated Extremities: No clubbing, no edema Neuro: Alert and oriented x2, moving all extremities GU: Fair output  Resolved Hospital Problem list     Assessment & Plan:  .  COPD exacerbation -We will continue bronchodilators, steroid taper, complete course of antibiotics  .  Hypoxemic/hypercarbic respiratory failure -Successfully weaned off BiPAP -Continue oxygen supplementation, titrate FiO2 to keep saturation stressed about 90  .  Advanced COPD -Previous pulmonary function study does reveal very advanced obstructive lung disease, severe reduction in diffusing capacity  .  Acute respiratory failure -Progressive symptoms led to presentation, probably related to very poor reserves -We will continue bronchodilators, steroids, course of antibiotics  .  Chronic diastolic heart failure -  Does appear euvolemic at present  .  History of alcohol abuse -Unclear how much he drinks -Monitor for withdrawals  .  History of dementia -Continue usual medications  Best practice:  Diet: Regular diet Pain/Anxiety/Delirium protocol (if indicated): As needed VAP protocol (if indicated):     DVT prophylaxis: SCD   GI prophylaxis:  Glucose control:  Mobility: Bedrest Code Status: Full code as noted in chart Family Communication: No family at bedside Disposition:   Labs   CBC: Recent Labs  Lab 04/28/18 1549 04/29/18 0342  WBC 5.4 2.5*  NEUTROABS 3.1  --   HGB 13.0 11.9*  HCT 42.9 39.2  MCV 102.6* 102.6*  PLT 100* 74*    Basic Metabolic Panel: Recent Labs  Lab 04/28/18 1549 04/29/18 0342 04/29/18 0900  NA 142 142  --   K 5.3* 5.4* 5.1  CL 107 110  --   CO2 26 23  --   GLUCOSE 129* 135*  --   BUN 23 30*  --   CREATININE 1.32* 1.48*  --   CALCIUM 8.8* 8.1*  --   MG  --  2.1  --   PHOS  --  4.9*  --    GFR: Estimated Creatinine Clearance: 30.8 mL/min (A) (by C-G formula based on SCr of 1.48 mg/dL (H)). Recent Labs  Lab 04/28/18 1549 04/29/18 0342  WBC 5.4 2.5*    Liver Function Tests: Recent Labs  Lab 04/28/18 1549 04/29/18 0342  AST 32 32  ALT 18 18  ALKPHOS 72 61  BILITOT 0.4 0.8  PROT 8.1 6.9  ALBUMIN 4.2 3.4*   No results for input(s): LIPASE, AMYLASE in the last 168 hours. No results for input(s): AMMONIA in the last 168 hours.  ABG    Component Value Date/Time   PHART 7.263 (L) 04/28/2018 2058   PCO2ART 53.7 (H) 04/28/2018 2058   PO2ART 82.0 (L) 04/28/2018 2058   HCO3 25.1 04/28/2018 2119   ACIDBASEDEF 3.3 (H) 04/28/2018 2119   O2SAT 62.8 04/28/2018 2119     Coagulation Profile: Recent Labs  Lab 04/28/18 2129  INR 0.94    Cardiac Enzymes: Recent Labs  Lab 04/28/18 1549 04/28/18 2125 04/29/18 0342  TROPONINI <0.03 0.04* 0.04*    HbA1C: Hgb A1c MFr Bld  Date/Time Value Ref Range Status  04/15/2016 02:11 AM 5.2 4.8 - 5.6 % Final    Comment:    (NOTE)         Pre-diabetes: 5.7 - 6.4         Diabetes: >6.4         Glycemic control for adults with diabetes: <7.0     CBG: No results for input(s): GLUCAP in the last 168 hours.  Review of Systems:   Review of Systems  Constitutional: Negative.   HENT:  Negative.   Eyes: Negative.   Respiratory: Positive for cough and shortness of breath.   Cardiovascular: Negative.   Gastrointestinal: Negative.   Genitourinary: Negative.   Musculoskeletal: Negative.   Skin: Negative.   Endo/Heme/Allergies: Negative.    Past Medical History  He,  has a past medical history of Alcohol abuse, Anemia, Aneurysm ([place]), CKD (chronic kidney disease), stage III ([place]), COPD (chronic obstructive pulmonary disease) ([place]), Dysrhythmia, Fever of unknown origin (08/2016), History of atrial fibrillation, Hyperchloremia, Hypercholesteremia, Hyperpotassemia, Hypertension, Hypertensive renal disease, benign, Leg edema, PAD (peripheral artery disease) ([place]), Pneumonia, Pressure ulcer of left heel, Seizures ([place]), Tobacco use, and Weight loss.   Surgical History    Past Surgical  History:  Procedure Laterality Date  . ABDOMINAL AORTOGRAM W/LOWER EXTREMITY N/A 08/13/2016   Procedure: Abdominal Aortogram w/Lower Extremity;  Surgeon: Wellington Hampshire, MD;  Location: [facility];  Service: Cardiovascular;  Laterality: N/A;  . 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, 1996  . EYE SURGERY     December 2017  . PERIPHERAL VASCULAR BALLOON ANGIOPLASTY  08/13/2016   Procedure: Peripheral Vascular Balloon Angioplasty;  Surgeon: Wellington Hampshire, MD;  Location: [facility];  Service: Cardiovascular;;  Aborted     Social History   reports that he quit smoking about 2 years ago. His smoking use included cigarettes. He has a 14.00 pack-year smoking history. He has never used smokeless tobacco. He reports that he does not drink alcohol or use drugs.   Family History   His family history includes Alzheimer's disease in his sister; Cancer in his mother; Clotting disorder in his sister and sister; Congestive Heart Failure in his brother, father, and sister.   Allergies Allergies  Allergen Reactions  . Diltiazem Rash and Other (See Comments)    Blisters  . Penicillins Other (See  Comments)    Unknown Childhood allergy  Has patient had a PCN reaction causing immediate rash, facial/tongue/throat swelling, SOB or lightheadedness with hypotension: unknown Has patient had a PCN reaction causing severe rash involving mucus membranes or skin necrosis: unknown Has patient had a PCN reaction that required hospitalization unknown Has patient had a PCN reaction occurring within the last 10 years:no If all of the above answers are "NO", then may proceed with Cephalosporin use.

## 2018-04-30 DIAGNOSIS — J9602 Acute respiratory failure with hypercapnia: Secondary | ICD-10-CM

## 2018-04-30 DIAGNOSIS — E875 Hyperkalemia: Secondary | ICD-10-CM

## 2018-04-30 LAB — URINALYSIS, COMPLETE (UACMP) WITH MICROSCOPIC
BACTERIA UA: NONE SEEN
Bilirubin Urine: NEGATIVE
Glucose, UA: NEGATIVE mg/dL
Hgb urine dipstick: NEGATIVE
Ketones, ur: NEGATIVE mg/dL
Leukocytes, UA: NEGATIVE
Nitrite: NEGATIVE
Protein, ur: NEGATIVE mg/dL
Specific Gravity, Urine: 1.02 (ref 1.005–1.030)
pH: 5 (ref 5.0–8.0)

## 2018-04-30 LAB — CBC
HCT: 37.6 % — ABNORMAL LOW (ref 39.0–52.0)
Hemoglobin: 11.2 g/dL — ABNORMAL LOW (ref 13.0–17.0)
MCH: 30.6 pg (ref 26.0–34.0)
MCHC: 29.8 g/dL — ABNORMAL LOW (ref 30.0–36.0)
MCV: 102.7 fL — AB (ref 80.0–100.0)
PLATELETS: 77 10*3/uL — AB (ref 150–400)
RBC: 3.66 MIL/uL — ABNORMAL LOW (ref 4.22–5.81)
RDW: 15.2 % (ref 11.5–15.5)
WBC: 6.7 10*3/uL (ref 4.0–10.5)
nRBC: 0 % (ref 0.0–0.2)

## 2018-04-30 LAB — RAPID URINE DRUG SCREEN, HOSP PERFORMED
Amphetamines: NOT DETECTED
Barbiturates: NOT DETECTED
Benzodiazepines: NOT DETECTED
Cocaine: NOT DETECTED
Opiates: NOT DETECTED
Tetrahydrocannabinol: NOT DETECTED

## 2018-04-30 LAB — BASIC METABOLIC PANEL
Anion gap: 5 (ref 5–15)
Anion gap: 3 — ABNORMAL LOW (ref 5–15)
BUN: 33 mg/dL — ABNORMAL HIGH (ref 8–23)
BUN: 35 mg/dL — ABNORMAL HIGH (ref 8–23)
CO2: 24 mmol/L (ref 22–32)
CO2: 25 mmol/L (ref 22–32)
Calcium: 8.2 mg/dL — ABNORMAL LOW (ref 8.9–10.3)
Calcium: 8.3 mg/dL — ABNORMAL LOW (ref 8.9–10.3)
Chloride: 113 mmol/L — ABNORMAL HIGH (ref 98–111)
Chloride: 114 mmol/L — ABNORMAL HIGH (ref 98–111)
Creatinine, Ser: 1.29 mg/dL — ABNORMAL HIGH (ref 0.61–1.24)
Creatinine, Ser: 1.25 mg/dL — ABNORMAL HIGH (ref 0.61–1.24)
GFR calc Af Amer: >60 mL/min (ref >60)
GFR calc Af Amer: >60 mL/min (ref >60)
GFR, EST NON AFRICAN AMERICAN: 52 mL/min — AB (ref >60)
GFR, EST NON AFRICAN AMERICAN: 54 mL/min — AB (ref >60)
GLUCOSE: 118 mg/dL — AB (ref 70–99)
Glucose, Bld: 146 mg/dL — ABNORMAL HIGH (ref 70–99)
Potassium: 5.9 mmol/L — ABNORMAL HIGH (ref 3.5–5.1)
Potassium: 6.3 mmol/L (ref 3.5–5.1)
Sodium: 142 mmol/L (ref 135–145)
Sodium: 142 mmol/L (ref 135–145)

## 2018-04-30 LAB — FOLATE RBC
FOLATE, RBC: 1337 ng/mL (ref >498)
Folate, Hemolysate: 492 ng/mL
Hematocrit: 36.8 % — ABNORMAL LOW (ref 37.5–51.0)

## 2018-04-30 LAB — POTASSIUM
POTASSIUM: 4 mmol/L (ref 3.5–5.1)
Potassium: 4.6 mmol/L (ref 3.5–5.1)

## 2018-04-30 LAB — CK: Total CK: 122 U/L (ref 49–397)

## 2018-04-30 LAB — NA AND K (SODIUM & POTASSIUM), RAND UR
Potassium Urine: 37 mmol/L
Sodium, Ur: 29 mmol/L

## 2018-04-30 MED ORDER — HYDRALAZINE HCL 20 MG/ML IJ SOLN
10.0000 mg | Freq: Four times a day (QID) | INTRAMUSCULAR | Status: DC | PRN
  Administered 2018-04-30: 10 mg via INTRAVENOUS
  Filled 2018-04-30: qty 1

## 2018-04-30 MED ORDER — SODIUM CHLORIDE 0.9 % IV SOLN
INTRAVENOUS | Status: AC
  Administered 2018-04-30: 09:00:00 via INTRAVENOUS

## 2018-04-30 MED ORDER — SODIUM ZIRCONIUM CYCLOSILICATE 10 G PO PACK
10.0000 g | PACK | Freq: Once | ORAL | Status: AC
  Administered 2018-04-30: 10 g via ORAL
  Filled 2018-04-30: qty 1

## 2018-04-30 MED ORDER — IPRATROPIUM-ALBUTEROL 0.5-2.5 (3) MG/3ML IN SOLN
3.0000 mL | Freq: Four times a day (QID) | RESPIRATORY_TRACT | Status: DC
  Administered 2018-04-30 – 2018-05-01 (×5): 3 mL via RESPIRATORY_TRACT
  Filled 2018-04-30 (×6): qty 3

## 2018-04-30 MED ORDER — CLONIDINE HCL 0.1 MG PO TABS
0.1000 mg | ORAL_TABLET | Freq: Once | ORAL | Status: AC
  Administered 2018-04-30: 0.1 mg via ORAL
  Filled 2018-04-30: qty 1

## 2018-04-30 MED ORDER — SODIUM CHLORIDE 0.9 % IV BOLUS
500.0000 mL | Freq: Once | INTRAVENOUS | Status: AC
  Administered 2018-04-30: 500 mL via INTRAVENOUS

## 2018-04-30 MED ORDER — HYDRALAZINE HCL 50 MG PO TABS
50.0000 mg | ORAL_TABLET | Freq: Three times a day (TID) | ORAL | Status: DC
  Administered 2018-04-30 – 2018-05-02 (×7): 50 mg via ORAL
  Filled 2018-04-30 (×7): qty 1

## 2018-04-30 MED ORDER — AMLODIPINE BESYLATE 10 MG PO TABS
10.0000 mg | ORAL_TABLET | Freq: Every day | ORAL | Status: DC
  Administered 2018-04-30 – 2018-05-02 (×3): 10 mg via ORAL
  Filled 2018-04-30 (×3): qty 1

## 2018-04-30 MED ORDER — SODIUM POLYSTYRENE SULFONATE 15 GM/60ML PO SUSP
15.0000 g | Freq: Once | ORAL | Status: AC
  Administered 2018-04-30: 15 g via ORAL
  Filled 2018-04-30: qty 60

## 2018-04-30 MED ORDER — FUROSEMIDE 10 MG/ML IJ SOLN
40.0000 mg | Freq: Once | INTRAMUSCULAR | Status: AC
  Administered 2018-04-30: 40 mg via INTRAVENOUS
  Filled 2018-04-30: qty 4

## 2018-04-30 MED ORDER — IPRATROPIUM-ALBUTEROL 0.5-2.5 (3) MG/3ML IN SOLN: 3.0000 mL | Freq: Four times a day (QID) | RESPIRATORY_TRACT | Status: DC | PRN

## 2018-04-30 NOTE — Progress Notes (Addendum)
[facility] PROGRESS NOTE  ORLIN KANN VQM:[id number] DOB: 03/23/1941 DOA: 04/28/2018  PCP: [name], NP  Brief History/Interval Summary: 78 y.o. 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 A. Fib,HLD, PAD, history of seizures (remote, not in the past 15 years), tobacco abuse, brain aneurysm status post clipping entered with cough shortness of breath.  He was seen initially by his primary care provider and it appears that he was given antibiotics and steroids.  There was no improvement in his symptoms so he was brought into the hospital.  In the emergency department he was found to have acute hypercapnic respiratory failure.  He was placed on BiPAP and was hospitalized.   Reason for Visit: Acute respiratory failure with hypercapnia.  Acute COPD exacerbation.  Consultants: Pulmonology  Procedures: None  Antibiotics: Doxycycline  Subjective/Interval History: Patient states that he is feeling better.  Cough is improved.  Did not require BiPAP since it was taken off yesterday.    ROS: Denies any chest pain  Objective:  Vital Signs  Vitals:   04/30/18 0600 04/30/18 0635 04/30/18 0700 04/30/18 0800  BP: (!) 160/55  (!) 177/54   Pulse: 64  63 66  Resp: 18  18 (!) 23  Temp:      TempSrc:      SpO2: 97% 98% 95% 95%  Weight:      Height:        Intake/Output Summary (Last 24 hours) at 04/30/2018 0915 Last data filed at 04/30/2018 0737 Gross per 24 hour  Intake 1432.94 ml  Output 900 ml  Net 532.94 ml   Filed Weights   04/28/18 1359 04/28/18 2030 04/30/18 0500  Weight: 59 kg 53.8 kg 55.7 kg   General appearance: Awake alert.  In no distress.  Mildly distracted Resp: Tachypneic at rest.  No use of accessory muscles.  Has wheezing heard bilaterally scattered.  No rhonchi.  Few crackles at the bases. Cardio: S1-S2 is normal regular.  No S3-S4.  No rubs murmurs or bruit GI: Abdomen is  soft.  Nontender nondistended.  Bowel sounds are present normal.  No masses organomegaly Extremities: No edema.  Full range of motion of lower extremities. Neurologic: Alert.  Oriented to place.  No focal neurological deficits.   Lab Results:  Data Reviewed: I have personally reviewed following labs and imaging studies  CBC: Recent Labs  Lab 04/28/18 1549 04/29/18 0342 04/30/18 0249  WBC 5.4 2.5* 6.7  NEUTROABS 3.1  --   --   HGB 13.0 11.9* 11.2*  HCT 42.9 39.2 37.6*  MCV 102.6* 102.6* 102.7*  PLT 100* 74* 77*    Basic Metabolic Panel: Recent Labs  Lab 04/28/18 1549 04/29/18 0342 04/29/18 0900 04/30/18 0249 04/30/18 0530  NA 142 142  --  142 142  K 5.3* 5.4* 5.1 5.9* 6.3*  CL 107 110  --  113* 114*  CO2 26 23  --  24 25  GLUCOSE 129* 135*  --  146* 118*  BUN 23 30*  --  33* 35*  CREATININE 1.32* 1.48*  --  1.29* 1.25*  CALCIUM 8.8* 8.1*  --  8.2* 8.3*  MG  --  2.1  --   --   --   PHOS  --  4.9*  --   --   --     GFR: Estimated Creatinine Clearance: 37.8 mL/min (A) (by C-G formula based on SCr of 1.25 mg/dL (H)).  Liver Function Tests: Recent Labs  Lab 04/28/18 1549 04/29/18 0342  AST 32 32  ALT 18 18  ALKPHOS 72 61  BILITOT 0.4 0.8  PROT 8.1 6.9  ALBUMIN 4.2 3.4*    Coagulation Profile: Recent Labs  Lab 04/28/18 2129  INR 0.94    Cardiac Enzymes: Recent Labs  Lab 04/28/18 1549 04/28/18 2125 04/29/18 0342 04/29/18 0900 04/30/18 0530  CKTOTAL  --   --   --   --  122  TROPONINI <0.03 0.04* 0.04* 0.04*  --     Thyroid Function Tests: Recent Labs    04/29/18 0342  TSH 0.260*     Recent Results (from the past 240 hour(s))  MRSA PCR Screening     Status: None   Collection Time: 04/28/18  9:02 PM  Result Value Ref Range Status   MRSA by PCR NEGATIVE NEGATIVE Final    Comment:        The GeneXpert MRSA Assay (FDA approved for NASAL specimens only), is one component of a comprehensive MRSA colonization surveillance program. It is  not intended to diagnose MRSA infection nor to guide or monitor treatment for MRSA infections. Performed at [facility], [place] [address], [place], [place] [address]   Respiratory Panel by PCR     Status: Abnormal   Collection Time: 04/29/18 12:30 AM  Result Value Ref Range Status   Adenovirus NOT DETECTED NOT DETECTED Final   Coronavirus 229E NOT DETECTED NOT DETECTED Final   Coronavirus HKU1 NOT DETECTED NOT DETECTED Final   Coronavirus NL63 NOT DETECTED NOT DETECTED Final   Coronavirus OC43 NOT DETECTED NOT DETECTED Final   Metapneumovirus DETECTED (A) NOT DETECTED Final   Rhinovirus / Enterovirus NOT DETECTED NOT DETECTED Final   Influenza A NOT DETECTED NOT DETECTED Final   Influenza B NOT DETECTED NOT DETECTED Final   Parainfluenza Virus 1 NOT DETECTED NOT DETECTED Final   Parainfluenza Virus 2 NOT DETECTED NOT DETECTED Final   Parainfluenza Virus 3 NOT DETECTED NOT DETECTED Final   Parainfluenza Virus 4 NOT DETECTED NOT DETECTED Final   Respiratory Syncytial Virus NOT DETECTED NOT DETECTED Final   Bordetella pertussis NOT DETECTED NOT DETECTED Final   Chlamydophila pneumoniae NOT DETECTED NOT DETECTED Final   Mycoplasma pneumoniae NOT DETECTED NOT DETECTED Final    Comment: Performed at [facility], [place] [address] [place], [place] [address]      Radiology Studies: Ct Head Wo Contrast  Result Date: 04/28/2018 CLINICAL DATA:  Altered mental status EXAM: CT HEAD WITHOUT CONTRAST TECHNIQUE: Contiguous axial images were obtained from the base of the skull through the vertex without intravenous contrast. COMPARISON:  Head CT 08/14/2016 FINDINGS: Brain: There is no mass, hemorrhage or extra-axial collection. There is generalized atrophy without lobar predilection. Hypodensity of the white matter is most commonly associated with chronic microvascular disease. Right frontal and anterior left temporal encephalomalacia is unchanged. Vascular: Aneurysm clip  adjacent to the left carotid terminus. Skull: Old left pterional craniotomy and right frontal craniotomy. Sinuses/Orbits: No fluid levels or advanced mucosal thickening of the visualized paranasal sinuses. No mastoid or middle ear effusion. The orbits are normal. IMPRESSION: Unchanged examination without acute intracranial abnormality. Electronically Signed   By: Ulyses Jarred M.D.   On: 04/28/2018 20:40   Dg Chest Port 1 View  Result Date: 04/28/2018 CLINICAL DATA:  Cough.  History of hypertension and COPD. EXAM: PORTABLE CHEST 1 VIEW COMPARISON:  Radiographs 08/03/2017.  CT 10/17/2016. FINDINGS: 1547 hours. Two  AP views obtained. The heart size and mediastinal contours are stable. There is mild cardiomegaly with atherosclerosis of the aorta and great vessels. The lungs are clear. There is no pleural effusion or pneumothorax. IVC filter noted. IMPRESSION: Stable chest.  No active cardiopulmonary process. Electronically Signed   By: Richardean Sale M.D.   On: 04/28/2018 16:06     Medications:  Scheduled: . atorvastatin  20 mg Oral Daily  . chlorhexidine  15 mL Mouth Rinse BID  . divalproex  250 mg Oral TID  . doxycycline  100 mg Oral Q12H  . feeding supplement  1 Container Oral TID BM  . guaiFENesin  600 mg Oral BID  . hydrALAZINE  50 mg Oral BID  . ipratropium-albuterol  3 mL Nebulization QID  . levETIRAcetam  500 mg Oral BID  . mouth rinse  15 mL Mouth Rinse q12n4p  . memantine  10 mg Oral BID  . multivitamin with minerals  1 tablet Oral Daily  . predniSONE  20 mg Oral BID WC  . thiamine  100 mg Oral Daily   Continuous: . sodium chloride 75 mL/hr at 04/30/18 8938   BOF:BPZWCHENIDPOE **OR** acetaminophen, guaiFENesin, hydrALAZINE, ipratropium-albuterol, ondansetron **OR** ondansetron (ZOFRAN) IV    Assessment/Plan:  Acute respiratory failure with hypercapnia and hypoxia Rita COPD exacerbation.  Improved with BiPAP.  Respiratory status appears to be stable.    Acute COPD  exacerbation/metapneumovirus Patient improving gradually.  Continue with antibiotics steroids nebulizer treatments.  Inhaled steroids.  Influenza PCR negative.  Respiratory viral panel positive for metapneumovirus.    Acute renal failure with hyperkalemia/CKD Stage III  BUN and creatinine noted to be elevated at the time of admission.  Creatinine has improved to 1.25.  Potassium however noted to be significantly elevated.  EKG did not show any changes of hyperkalemia.  Patient has been given medications to lower potassium.  Recheck later today.  Holding ARB.  Not noted to be on any potassium supplements.  CK level is normal.    Essential hypertension Blood Pressure is noted to be elevated at times.  Patient's ARB is on hold due to hyperkalemia.  Hydralazine as needed.  May need to consider alternative oral agents.  Steroid is likely contributing to elevated blood pressure as well.    Chronic diastolic CHF Currently appears to be euvolemic.  Echocardiogram from 2018 showed normal systolic function.  Grade 2 diastolic dysfunction was noted.  Thrombocytopenia Reason for low platelet count not entirely clear.  Runs low but stable.  No evidence of bleeding.  Avoid heparin products.    Macrocytic anemia No evidence of bleeding.  Check anemia panel including B12 folate.  TSH noted to be 0.26.  TSH can be rechecked in few weeks time.  Peripheral vascular disease Does not appear to be an active issue currently.  ABIs were done which showed evidence for severe left lower extremity arterial disease.  No acute ischemia present at this time.  This can be addressed in the outpatient setting.  History of alcohol abuse Unclear if this is current or not.  No evidence for withdrawal currently.  Thiamine.  History of seizure disorder Continue Keppra.  History of dementia Stable.  Continue Depakote.   DVT Prophylaxis: SCDs    Code Status: Full code Family Communication: Discussed with the patient.  No  family at bedside Disposition Plan: Start mobilizing.  Wait for potassium to improve.  Hopefully transfer to floor later today.    LOS: 2 days   Bonnielee Haff  [facility] operational officer on [email or web address]  04/30/2018, 9:15 AM

## 2018-04-30 NOTE — Progress Notes (Addendum)
NAME:  [name], MRN:  [id number], DOB:  [birth year], LOS: 2 ADMISSION DATE:  04/28/2018, CONSULTATION DATE: 04/29/2018 REFERRING MD: Dr. Roel Cluck, CHIEF COMPLAINT: Shortness of breath/cough  Brief History   Patient presented to the hospital with a few days of persistent cough Has been coughing for at least a couple of months Was treated with a course of antibiotics and steroids-finish the course but with nonresolution Progressive symptoms led to presentation to the hospital He has a history of COPD, dementia, hypertension, history of EtOH abuse  History of present illness   Progressive symptoms despite completing course of antibiotics and steroids recently Significant shortness of breath led to presentation to the hospital He has a known history of advanced chronic obstructive pulmonary disease History of grade 2 diastolic heart failure  Past Medical History   Past Medical History:  Diagnosis Date  . Alcohol abuse   . Anemia   . Aneurysm ([place])   . CKD (chronic kidney disease), stage III ([place])   . COPD (chronic obstructive pulmonary disease) ([place])   . Dysrhythmia   . Fever of unknown origin 08/2016  . History of atrial fibrillation   . Hyperchloremia   . Hypercholesteremia   . Hyperpotassemia   . Hypertension   . Hypertensive renal disease, benign   . Leg edema   . PAD (peripheral artery disease) ([place])   . Pneumonia   . Pressure ulcer of left heel   . Seizures ([place])    has not had a seizure in 15 yrs  . Tobacco use   . Weight loss    Significant Hospital Events    Improvement in symptoms since being in the hospital He was started on BiPAP-improvement in hypercapnic failure and mental status  Consults:  PCCM 04/29/2018  Procedures:  None  Significant Diagnostic Tests:  Chest x-ray on 04/28/2018, no acute infiltrate  Micro Data:  Respiratory viral panel positive for metapneumovirus MRSA by PCR negative Influenza PCR negative  Antimicrobials:  Doxycycline  04/28/2018>>  Interim history/subjective:  Successfully weaned off BiPAP Currently on oxygen supplementation Awake alert, feels generally better Decreasing cough with no expectoration  Objective   Blood pressure (!) 164/130, pulse 65, temperature 97.7 F (36.5 C), temperature source Oral, resp. rate 20, height 5\' 6"  (1.676 m), weight 55.7 kg, SpO2 98 %.        Intake/Output Summary (Last 24 hours) at 04/30/2018 0939 Last data filed at 04/30/2018 0929 Gross per 24 hour  Intake 1552.94 ml  Output 1400 ml  Net 152.94 ml   Filed Weights   04/28/18 1359 04/28/18 2030 04/30/18 0500  Weight: 59 kg 53.8 kg 55.7 kg    Examination: General: Awake and alert, oriented to person and place HENT: Moist oral mucosa Lungs: Decreased air entry, no rales Cardiovascular: S1-S2 appreciated Abdomen: Bowel sounds appreciated Extremities: No clubbing, no edema Neuro: Alert and oriented x2, moving all extremities GU: Fair urine output  Resolved Hospital Problem list     Assessment & Plan:  .  COPD exacerbation -We will continue bronchodilators, steroid taper, complete course of antibiotics -Currently on prednisone 20 p.o. twice daily-we will wean off after about 5 days -Continue doxycycline and complete course -Secondary to metapneumovirus infection-continue supportive measures  .  Hypoxemic/hypercarbic respiratory failure -Successfully weaned off BiPAP -Continue oxygen supplementation, titrate FiO2 to keep saturation stressed about 90 -Continue bronchodilator -Currently on DuoNeb  .  Advanced COPD -Previous pulmonary function study does reveal very advanced obstructive lung disease, severe reduction in diffusing capacity   -  Bronchodilators -Steroids-short-term  .  Acute respiratory failure -Progressive symptoms led to presentation, probably related to very poor reserves -We will continue bronchodilators, steroids, course of antibiotics  .  Chronic diastolic heart failure -Does  appear euvolemic at present -Last echocardiogram was January 2018 showing an ejection fraction of 60 to 35%, grade 2 diastolic dysfunction  .  History of alcohol abuse -Unclear how much he drinks -Monitor for withdrawals  .  History of dementia -Continue usual medications  Risk of decompensation is still quite significant as he came in with hypercapnic respiratory failure Needs to be compliant with bronchodilator treatments as he does not have significant reserves Needs to stay off alcohol   Best practice:  Diet: Regular diet Pain/Anxiety/Delirium protocol (if indicated): As needed VAP protocol (if indicated): DVT prophylaxis: SCD GI prophylaxis:  Glucose control: Mobility: Ambulate as tolerated Code Status: Full code Family Communication: No family at bedside Disposition: May be transferred to medical floor from a pulmonary perspective  Labs   CBC: Recent Labs  Lab 04/28/18 1549 04/29/18 0342 04/30/18 0249  WBC 5.4 2.5* 6.7  NEUTROABS 3.1  --   --   HGB 13.0 11.9* 11.2*  HCT 42.9 39.2 37.6*  MCV 102.6* 102.6* 102.7*  PLT 100* 74* 77*    Basic Metabolic Panel: Recent Labs  Lab 04/28/18 1549 04/29/18 0342 04/29/18 0900 04/30/18 0249 04/30/18 0530  NA 142 142  --  142 142  K 5.3* 5.4* 5.1 5.9* 6.3*  CL 107 110  --  113* 114*  CO2 26 23  --  24 25  GLUCOSE 129* 135*  --  146* 118*  BUN 23 30*  --  33* 35*  CREATININE 1.32* 1.48*  --  1.29* 1.25*  CALCIUM 8.8* 8.1*  --  8.2* 8.3*  MG  --  2.1  --   --   --   PHOS  --  4.9*  --   --   --    GFR: Estimated Creatinine Clearance: 37.8 mL/min (A) (by C-G formula based on SCr of 1.25 mg/dL (H)). Recent Labs  Lab 04/28/18 1549 04/29/18 0342 04/30/18 0249  WBC 5.4 2.5* 6.7    Liver Function Tests: Recent Labs  Lab 04/28/18 1549 04/29/18 0342  AST 32 32  ALT 18 18  ALKPHOS 72 61  BILITOT 0.4 0.8  PROT 8.1 6.9  ALBUMIN 4.2 3.4*   No results for input(s): LIPASE, AMYLASE in the last 168 hours. No  results for input(s): AMMONIA in the last 168 hours.  ABG    Component Value Date/Time   PHART 7.263 (L) 04/28/2018 2058   PCO2ART 53.7 (H) 04/28/2018 2058   PO2ART 82.0 (L) 04/28/2018 2058   HCO3 25.1 04/28/2018 2119   ACIDBASEDEF 3.3 (H) 04/28/2018 2119   O2SAT 62.8 04/28/2018 2119     Coagulation Profile: Recent Labs  Lab 04/28/18 2129  INR 0.94    Cardiac Enzymes: Recent Labs  Lab 04/28/18 1549 04/28/18 2125 04/29/18 0342 04/29/18 0900 04/30/18 0530  CKTOTAL  --   --   --   --  122  TROPONINI <0.03 0.04* 0.04* 0.04*  --     HbA1C: Hgb A1c MFr Bld  Date/Time Value Ref Range Status  04/15/2016 02:11 AM 5.2 4.8 - 5.6 % Final    Comment:    (NOTE)         Pre-diabetes: 5.7 - 6.4         Diabetes: >6.4  Glycemic control for adults with diabetes: <7.0     CBG: No results for input(s): GLUCAP in the last 168 hours.  Review of Systems:   Review of Systems  Constitutional: Negative.   HENT: Negative.   Eyes: Negative.   Respiratory: Positive for cough. Negative for sputum production and shortness of breath.   Cardiovascular: Negative.   Gastrointestinal: Negative.   Genitourinary: Negative.   Musculoskeletal: Negative.   Skin: Negative.   Endo/Heme/Allergies: Negative.      Past Medical History  He,  has a past medical history of Alcohol abuse, Anemia, Aneurysm ([place]), CKD (chronic kidney disease), stage III ([place]), COPD (chronic obstructive pulmonary disease) ([place]), Dysrhythmia, Fever of unknown origin (08/2016), History of atrial fibrillation, Hyperchloremia, Hypercholesteremia, Hyperpotassemia, Hypertension, Hypertensive renal disease, benign, Leg edema, PAD (peripheral artery disease) ([place]), Pneumonia, Pressure ulcer of left heel, Seizures ([place]), Tobacco use, and Weight loss.   Surgical History    Past Surgical History:  Procedure Laterality Date  . ABDOMINAL AORTOGRAM W/LOWER EXTREMITY N/A 08/13/2016   Procedure: Abdominal Aortogram w/Lower  Extremity;  Surgeon: Wellington Hampshire, MD;  Location: [facility];  Service: Cardiovascular;  Laterality: N/A;  . 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, 1996  . EYE SURGERY     December 2017  . PERIPHERAL VASCULAR BALLOON ANGIOPLASTY  08/13/2016   Procedure: Peripheral Vascular Balloon Angioplasty;  Surgeon: Wellington Hampshire, MD;  Location: [facility];  Service: Cardiovascular;;  Aborted     Social History   reports that he quit smoking about 2 years ago. His smoking use included cigarettes. He has a 14.00 pack-year smoking history. He has never used smokeless tobacco. He reports that he does not drink alcohol or use drugs.   Family History   His family history includes Alzheimer's disease in his sister; Cancer in his mother; Clotting disorder in his sister and sister; Congestive Heart Failure in his brother, father, and sister.

## 2018-04-30 NOTE — Progress Notes (Signed)
Pt. BP has been high during my entire shift Hydralazine given around 10am and regularly scheduled hydralazine given. Still no change with BP. MD paged. Orders given for PO Norvasc. BP still remains unchanged. MD paged again and orders increased for IV hydralazine to 10mg . Pt. Asymtomatic, will continue to monitor closely.

## 2018-04-30 NOTE — Progress Notes (Signed)
eLink Physician-Brief Progress Note Patient Name: [name] DOB: [birth year] MRN: [id number]   Date of Service  04/30/2018  HPI/Events of Note  K+ 5.9  eICU Interventions  Hyperkalemia treatment protocol for mild hyperkalemia ordered.        Kerry Kass Ogan 04/30/2018, 5:09 AM

## 2018-05-01 DIAGNOSIS — J9601 Acute respiratory failure with hypoxia: Secondary | ICD-10-CM

## 2018-05-01 LAB — BASIC METABOLIC PANEL
Anion gap: 6 (ref 5–15)
BUN: 35 mg/dL — ABNORMAL HIGH (ref 8–23)
CO2: 24 mmol/L (ref 22–32)
Calcium: 8.2 mg/dL — ABNORMAL LOW (ref 8.9–10.3)
Chloride: 114 mmol/L — ABNORMAL HIGH (ref 98–111)
Creatinine, Ser: 1.11 mg/dL (ref 0.61–1.24)
GFR calc Af Amer: >60 mL/min (ref >60)
GFR calc non Af Amer: >60 mL/min (ref >60)
Glucose, Bld: 105 mg/dL — ABNORMAL HIGH (ref 70–99)
Potassium: 4.2 mmol/L (ref 3.5–5.1)
Sodium: 144 mmol/L (ref 135–145)

## 2018-05-01 LAB — CBC
HCT: 34.8 % — ABNORMAL LOW (ref 39.0–52.0)
HCT: 37.4 % — ABNORMAL LOW (ref 39.0–52.0)
HEMOGLOBIN: 11.6 g/dL — AB (ref 13.0–17.0)
Hemoglobin: 10.7 g/dL — ABNORMAL LOW (ref 13.0–17.0)
MCH: 30.8 pg (ref 26.0–34.0)
MCH: 31.4 pg (ref 26.0–34.0)
MCHC: 30.7 g/dL (ref 30.0–36.0)
MCHC: 31 g/dL (ref 30.0–36.0)
MCV: 100.3 fL — ABNORMAL HIGH (ref 80.0–100.0)
MCV: 101.1 fL — ABNORMAL HIGH (ref 80.0–100.0)
NRBC: 0 % (ref 0.0–0.2)
Platelets: 84 10*3/uL — ABNORMAL LOW (ref 150–400)
Platelets: 92 10*3/uL — ABNORMAL LOW (ref 150–400)
RBC: 3.47 MIL/uL — ABNORMAL LOW (ref 4.22–5.81)
RBC: 3.7 MIL/uL — ABNORMAL LOW (ref 4.22–5.81)
RDW: 15.4 % (ref 11.5–15.5)
RDW: 15.4 % (ref 11.5–15.5)
WBC: 8.6 10*3/uL (ref 4.0–10.5)
WBC: 8.9 10*3/uL (ref 4.0–10.5)
nRBC: 0 % (ref 0.0–0.2)

## 2018-05-01 LAB — RETICULOCYTES
Immature Retic Fract: 5.6 % (ref 2.3–15.9)
RBC.: 3.47 MIL/uL — ABNORMAL LOW (ref 4.22–5.81)
Retic Count, Absolute: 22.9 10*3/uL (ref 19.0–186.0)
Retic Ct Pct: 0.7 % (ref 0.4–3.1)

## 2018-05-01 LAB — IRON AND TIBC
Iron: 124 ug/dL (ref 45–182)
Saturation Ratios: 64 % — ABNORMAL HIGH (ref 17.9–39.5)
TIBC: 194 ug/dL — ABNORMAL LOW (ref 250–450)
UIBC: 70 ug/dL

## 2018-05-01 LAB — VITAMIN B12: Vitamin B-12: 2330 pg/mL — ABNORMAL HIGH (ref 180–914)

## 2018-05-01 LAB — FERRITIN: Ferritin: 213 ng/mL (ref 24–336)

## 2018-05-01 LAB — FOLATE: FOLATE: 16.1 ng/mL (ref >5.9)

## 2018-05-01 MED ORDER — IPRATROPIUM-ALBUTEROL 0.5-2.5 (3) MG/3ML IN SOLN
3.0000 mL | Freq: Three times a day (TID) | RESPIRATORY_TRACT | Status: DC
  Administered 2018-05-01 – 2018-05-02 (×3): 3 mL via RESPIRATORY_TRACT
  Filled 2018-05-01 (×3): qty 3

## 2018-05-01 NOTE — Progress Notes (Signed)
[facility] PROGRESS NOTE  MAT STUARD QQP:[id number] DOB: 1940-11-04 DOA: 04/28/2018  PCP: [name], NP  Brief History/Interval Summary: 78 y.o. 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 A. Fib,HLD, PAD, history of seizures (remote, not in the past 15 years), tobacco abuse, brain aneurysm status post clipping entered with cough shortness of breath.  He was seen initially by his primary care provider and it appears that he was given antibiotics and steroids.  There was no improvement in his symptoms so he was brought into the hospital.  In the emergency department he was found to have acute hypercapnic respiratory failure.  He was placed on BiPAP and was hospitalized.   Reason for Visit: Acute respiratory failure with hypercapnia.  Acute COPD exacerbation.  Consultants: Pulmonology  Procedures: None  Antibiotics: Doxycycline  Subjective/Interval History: Patient states that his breathing is improving.  Cough is better.  No issues overnight.     ROS: Denies any chest pain.  Objective:  Vital Signs  Vitals:   04/30/18 1927 04/30/18 2100 05/01/18 0507 05/01/18 0735  BP:  136/65 (!) 130/54   Pulse:  74 70 72  Resp:  (!) 21 19 18   Temp:  98.7 F (37.1 C) 98.1 F (36.7 C)   TempSrc:  Oral Oral   SpO2: 92% 94% 95% 94%  Weight:      Height:        Intake/Output Summary (Last 24 hours) at 05/01/2018 1231 Last data filed at 05/01/2018 0900 Gross per 24 hour  Intake 1441.5 ml  Output 450 ml  Net 991.5 ml   Filed Weights   04/28/18 1359 04/28/18 2030 04/30/18 0500  Weight: 59 kg 53.8 kg 55.7 kg   General appearance: Awake alert.  In no distress Resp: Tachypnea has improved.  No use of accessory muscles.  No wheezing appreciated today.  No crackles.   Cardio: S1-S2 is normal regular.  No S3-S4.  No rubs murmurs or bruit GI: Abdomen is soft.  Nontender nondistended.  Bowel sounds are  present normal.  No masses organomegaly Extremities: No edema.  Full range of motion of lower extremities. Neurologic:   No focal neurological deficits.    Lab Results:  Data Reviewed: I have personally reviewed following labs and imaging studies  CBC: Recent Labs  Lab 04/28/18 1549 04/28/18 2129 04/29/18 0342 04/30/18 0249 05/01/18 0512  WBC 5.4  --  2.5* 6.7 8.6  NEUTROABS 3.1  --   --   --   --   HGB 13.0  --  11.9* 11.2* 10.7*  HCT 42.9 36.8* 39.2 37.6* 34.8*  MCV 102.6*  --  102.6* 102.7* 100.3*  PLT 100*  --  74* 77* 84*    Basic Metabolic Panel: Recent Labs  Lab 04/28/18 1549 04/29/18 0342  04/30/18 0249 04/30/18 0530 04/30/18 1008 04/30/18 1327 05/01/18 0512  NA 142 142  --  142 142  --   --  144  K 5.3* 5.4*   < > 5.9* 6.3* 4.6 4.0 4.2  CL 107 110  --  113* 114*  --   --  114*  CO2 26 23  --  24 25  --   --  24  GLUCOSE 129* 135*  --  146* 118*  --   --  105*  BUN 23 30*  --  33* 35*  --   --  35*  CREATININE 1.32* 1.48*  --  1.29* 1.25*  --   --  1.11  CALCIUM 8.8* 8.1*  --  8.2* 8.3*  --   --  8.2*  MG  --  2.1  --   --   --   --   --   --   PHOS  --  4.9*  --   --   --   --   --   --    < > = values in this interval not displayed.    GFR: Estimated Creatinine Clearance: 42.5 mL/min (by C-G formula based on SCr of 1.11 mg/dL).  Liver Function Tests: Recent Labs  Lab 04/28/18 1549 04/29/18 0342  AST 32 32  ALT 18 18  ALKPHOS 72 61  BILITOT 0.4 0.8  PROT 8.1 6.9  ALBUMIN 4.2 3.4*    Coagulation Profile: Recent Labs  Lab 04/28/18 2129  INR 0.94    Cardiac Enzymes: Recent Labs  Lab 04/28/18 1549 04/28/18 2125 04/29/18 0342 04/29/18 0900 04/30/18 0530  CKTOTAL  --   --   --   --  122  TROPONINI <0.03 0.04* 0.04* 0.04*  --     Thyroid Function Tests: Recent Labs    04/29/18 0342  TSH 0.260*     Recent Results (from the past 240 hour(s))  MRSA PCR Screening     Status: None   Collection Time: 04/28/18  9:02 PM  Result  Value Ref Range Status   MRSA by PCR NEGATIVE NEGATIVE Final    Comment:        The GeneXpert MRSA Assay (FDA approved for NASAL specimens only), is one component of a comprehensive MRSA colonization surveillance program. It is not intended to diagnose MRSA infection nor to guide or monitor treatment for MRSA infections. Performed at [facility], [place] [address] [place], [place] [address]   Respiratory Panel by PCR     Status: Abnormal   Collection Time: 04/29/18 12:30 AM  Result Value Ref Range Status   Adenovirus NOT DETECTED NOT DETECTED Final   Coronavirus 229E NOT DETECTED NOT DETECTED Final   Coronavirus HKU1 NOT DETECTED NOT DETECTED Final   Coronavirus NL63 NOT DETECTED NOT DETECTED Final   Coronavirus OC43 NOT DETECTED NOT DETECTED Final   Metapneumovirus DETECTED (A) NOT DETECTED Final   Rhinovirus / Enterovirus NOT DETECTED NOT DETECTED Final   Influenza A NOT DETECTED NOT DETECTED Final   Influenza B NOT DETECTED NOT DETECTED Final   Parainfluenza Virus 1 NOT DETECTED NOT DETECTED Final   Parainfluenza Virus 2 NOT DETECTED NOT DETECTED Final   Parainfluenza Virus 3 NOT DETECTED NOT DETECTED Final   Parainfluenza Virus 4 NOT DETECTED NOT DETECTED Final   Respiratory Syncytial Virus NOT DETECTED NOT DETECTED Final   Bordetella pertussis NOT DETECTED NOT DETECTED Final   Chlamydophila pneumoniae NOT DETECTED NOT DETECTED Final   Mycoplasma pneumoniae NOT DETECTED NOT DETECTED Final    Comment: Performed at [facility], [place] [address], [place], [place] [address]      Radiology Studies: No results found.   Medications:  Scheduled: . amLODipine  10 mg Oral Daily  . atorvastatin  20 mg Oral Daily  . chlorhexidine  15 mL Mouth Rinse BID  . divalproex  250 mg Oral TID  . doxycycline  100 mg Oral Q12H  . feeding supplement  1 Container Oral TID BM  . guaiFENesin  600 mg Oral BID  . hydrALAZINE  50 mg Oral Q8H  . ipratropium-albuterol  3  mL Nebulization TID  . levETIRAcetam  500 mg Oral BID  . mouth rinse  15 mL Mouth Rinse q12n4p  . memantine  10 mg Oral BID  . multivitamin with minerals  1 tablet Oral Daily  . predniSONE  20 mg Oral BID WC  . thiamine  100 mg Oral Daily   Continuous:  [id number] **OR** acetaminophen, guaiFENesin, hydrALAZINE, ipratropium-albuterol, ondansetron **OR** ondansetron (ZOFRAN) IV    Assessment/Plan:  Acute respiratory failure with hypercapnia and hypoxia Secondary to COPD exacerbation.  Improved with BiPAP.  Seems to be stable now.  Pulmonology was following.  Acute COPD exacerbation/metapneumovirus Patient has improved.  He remains on inhaled steroids.  He is on systemic steroids antibiotics nebulizer treatments.  Influenza PCR negative.  Respiratory viral panel positive for metapneumovirus.    Acute renal failure with hyperkalemia/CKD Stage III  BUN and creatinine noted to be elevated at the time of admission.  Creatinine has improved and now close to baseline.  Potassium level normal this morning.  Continue to hold ARB.  CK level was normal.  Essential hypertension Poorly controlled yesterday requiring multiple adjustments to his antihypertensives.  Seems to be better over the last 12 hours.  Dose of hydralazine was increased.  Continue with amlodipine.  Continue to hold ARB due to hyperkalemia.  Steroids also contributing to elevated blood pressure.    Chronic diastolic CHF Currently appears to be euvolemic.  Echocardiogram from 2018 showed normal systolic function.  Grade 2 diastolic dysfunction was noted.  Thrombocytopenia Reason for low platelet count not entirely clear.  Counts are low but stable.  No evidence of bleeding.  Avoid heparin products.    Macrocytic anemia No evidence of bleeding.  Hemoglobin is stable for the most part.  Anemia panel reviewed.  No evidence for iron deficiency.  Vitamin B12 2330.  Folate 16.1.  TSH noted to be 0.26.  TSH can be rechecked in  few weeks time.  Peripheral vascular disease Does not appear to be an active issue currently.  ABIs were done which showed evidence for severe left lower extremity arterial disease.  No acute ischemia present at this time.  This can be addressed in the outpatient setting.  History of alcohol abuse Unclear if this is current or not.  No evidence for withdrawal currently.  Thiamine.  History of seizure disorder Continue Keppra.  History of dementia Stable.  Continue Depakote.   DVT Prophylaxis: SCDs    Code Status: Full code Family Communication: Discussed with the patient.  No family at bedside. Disposition Plan: Patient has improved from a respiratory standpoint.  Was seen by PT and OT 2 days ago and there was mention of possible need for short-term rehab.  PT will need to reevaluate.  Discussed with the patient's sister.  Apparently patient lives with his brother who will be unable to provide around-the-clock assistance and supervision.  We will need to see how patient does with physical therapy on the reevaluation.    LOS: 3 days   Kelvis Berger [employer] on [email or web address]  05/01/2018, 12:31 PM

## 2018-05-01 NOTE — Progress Notes (Signed)
Physical Therapy Treatment Patient Details Name: [name] MRN: [id number] DOB: 1940-11-10 Today's Date: 05/01/2018    History of Present Illness 78 y.o. 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 A. Fib,HLD, PAD, remote history of seizures, tobacco abuse, brain aneurysm status post clipping and admitted for acute COPD exacerbation.    PT Comments    Pt in bed on RA easily awoken.  Oriented to time.  Mad he missed breakfast.  Assisted OOB to amb a great distance. General Gait Details: used a walker for safety but believe pt will be able to amb without within his own home.  Amb on RA sats >90% and avg HR 74.  BP was 182/81 with min c/o dizziness. Otherwise tolerated well.  Pt at a mobility level to be able to D/C to home.       Follow Up Recommendations  Home health PT     Equipment Recommendations  None recommended by PT    Recommendations for Other Services       Precautions / Restrictions Precautions Precautions: Fall Precaution Comments: monitor vitals  Restrictions Weight Bearing Restrictions: No    Mobility  Bed Mobility Overal bed mobility: Modified Independent             General bed mobility comments: able to self perform   Transfers Overall transfer level: Needs assistance Equipment used: None Transfers: Sit to/from Stand;Stand Pivot Transfers Sit to Stand: Supervision Stand pivot transfers: Supervision       General transfer comment: one VC for environmental safety and walker usage (pt not use to using a walker)   Ambulation/Gait Ambulation/Gait assistance: Supervision;Min guard Gait Distance (Feet): 175 Feet Assistive device: Rolling walker (2 wheeled) Gait Pattern/deviations: Step-through pattern Gait velocity: WFL   General Gait Details: used a walker for safety but believe pt will be able to amb without within his own home.  Amb on RA sats >90% and avg HR 74.  BP  was 182/81 with min c/o dizziness    Stairs             Wheelchair Mobility    Modified Rankin (Stroke Patients Only)       Balance                                            Cognition Arousal/Alertness: Awake/alert Behavior During Therapy: WFL for tasks assessed/performed Overall Cognitive Status: Within Functional Limits for tasks assessed                                 General Comments: pt appears at prior level.  He;s a character      Exercises      General Comments        Pertinent Vitals/Pain Pain Assessment: No/denies pain    Home Living                      Prior Function            PT Goals (current goals can now be found in the care plan section) Progress towards PT goals: Progressing toward goals    Frequency    Min 3X/week      PT Plan Current plan remains appropriate    Co-evaluation  AM-PAC PT "6 Clicks" Mobility   Outcome Measure  Help needed turning from your back to your side while in a flat bed without using bedrails?: A Little Help needed moving from lying on your back to sitting on the side of a flat bed without using bedrails?: A Little Help needed moving to and from a bed to a chair (including a wheelchair)?: A Little Help needed standing up from a chair using your arms (e.g., wheelchair or bedside chair)?: A Little Help needed to walk in hospital room?: A Little Help needed climbing 3-5 steps with a railing? : A Little 6 Click Score: 18    End of Session Equipment Utilized During Treatment: Gait belt Activity Tolerance: Patient tolerated treatment well Patient left: in chair;with chair alarm set;with call bell/phone within reach;with family/visitor present Nurse Communication: Mobility status(pt is safe to D/C to home with family support) PT Visit Diagnosis: Other abnormalities of gait and mobility (R26.89)     Time: 2103-1281 PT Time Calculation (min)  (ACUTE ONLY): 28 min  Charges:  $Gait Training: 8-22 mins $Therapeutic Activity: 8-22 mins                     Rica Koyanagi  PTA Acute  Rehabilitation Services Pager      [phone] Office      [phone]

## 2018-05-01 NOTE — Progress Notes (Signed)
NAME:  [name], MRN:  [id number], DOB:  28-Jun-1940, LOS: 3 ADMISSION DATE:  04/28/2018, CONSULTATION DATE: 04/29/2018 REFERRING MD: Dr. Roel Cluck, CHIEF COMPLAINT: Shortness of breath/cough  Brief History   Patient presented to the hospital with a few days of persistent cough Has been coughing for at least a couple of months Was treated with a course of antibiotics and steroids-finish the course but with nonresolution Progressive symptoms led to presentation to the hospital He has a history of COPD, dementia, hypertension, history of EtOH abuse  History of present illness   Progressive symptoms despite completing course of antibiotics and steroids recently Significant shortness of breath led to presentation to the hospital He has a known history of advanced chronic obstructive pulmonary disease History of grade 2 diastolic heart failure  Past Medical History   Past Medical History:  Diagnosis Date  . Alcohol abuse   . Anemia   . Aneurysm ([place])   . CKD (chronic kidney disease), stage III ([place])   . COPD (chronic obstructive pulmonary disease) ([place])   . Dysrhythmia   . Fever of unknown origin 08/2016  . History of atrial fibrillation   . Hyperchloremia   . Hypercholesteremia   . Hyperpotassemia   . Hypertension   . Hypertensive renal disease, benign   . Leg edema   . PAD (peripheral artery disease) ([place])   . Pneumonia   . Pressure ulcer of left heel   . Seizures ([place])    has not had a seizure in 15 yrs  . Tobacco use   . Weight loss    Significant Hospital Events    Improvement in symptoms since being in the hospital He was started on BiPAP-improvement in hypercapnic failure and mental status  Consults:  PCCM 04/29/2018  Procedures:  None  Significant Diagnostic Tests:  Chest x-ray on 04/28/2018, no acute infiltrate  Micro Data:  Respiratory viral panel positive for metapneumovirus MRSA by PCR negative Influenza PCR negative  Antimicrobials:  Doxycycline  04/28/2018>>  Interim history/subjective:  Successfully weaned off BiPAP Awake alert, feels generally better Decreasing cough with no expectoration On room air  Objective   Blood pressure (!) 130/54, pulse 72, temperature 98.1 F (36.7 C), temperature source Oral, resp. rate 18, height 5\' 6"  (1.676 m), weight 55.7 kg, SpO2 94 %.        Intake/Output Summary (Last 24 hours) at 05/01/2018 1141 Last data filed at 05/01/2018 0900 Gross per 24 hour  Intake 1507.4 ml  Output 450 ml  Net 1057.4 ml   Filed Weights   04/28/18 1359 04/28/18 2030 04/30/18 0500  Weight: 59 kg 53.8 kg 55.7 kg    Examination: General: Awake and alert, oriented to person and place HENT: Moist oral mucosa Lungs: Decreased air entry, no rales Cardiovascular: S1-S2 Abdomen: Bowel sounds appreciated Extremities: No clubbing, no edema Neuro: Alert and oriented x2, moving all extremities GU: Fair urine output  Resolved Hospital Problem list     Assessment & Plan:  .  COPD exacerbation -Continue bronchodilators, complete course of antibiotics -Wean off prednisone in a few days  -Continue doxycycline and complete course -Secondary to metapneumovirus infection-continue supportive measures  .  Hypoxemic/hypercarbic respiratory failure -Successfully weaned off BiPAP -Continue bronchodilator -Currently on DuoNeb -Resolved  .  Advanced COPD -Previous pulmonary function study does reveal very advanced obstructive lung disease, severe reduction in diffusing capacity   -Bronchodilators -Steroids-short-term  .  Acute respiratory failure -Progressive symptoms led to presentation, probably related to very poor reserves -We  will continue bronchodilators, steroids, course of antibiotics  .  Chronic diastolic heart failure -Does appear euvolemic at present -Last echocardiogram was January 2018 showing an ejection fraction of 60 to 65%, grade 2 diastolic dysfunction  .  History of alcohol abuse -Unclear  how much he drinks -Monitor for withdrawals  .  History of dementia -Continue usual medications  Seems to be doing better Back to his baseline Bronchodilators at home Complete steroids, complete antibiotics  Stable to be discharged from a pulmonary perspective Risk of decompensation is high if he goes back to drinking and not compliant with ongoing treatment   Best practice:  Diet: Regular diet Pain/Anxiety/Delirium protocol (if indicated): As needed VAP protocol (if indicated): DVT prophylaxis: SCD GI prophylaxis:  Glucose control: Mobility: Ambulate as tolerated Code Status: Full code Family Communication: No family at bedside Disposition: May be transferred to medical floor from a pulmonary perspective  Labs   CBC: Recent Labs  Lab 04/28/18 1549 04/28/18 2129 04/29/18 0342 04/30/18 0249 05/01/18 0512  WBC 5.4  --  2.5* 6.7 8.6  NEUTROABS 3.1  --   --   --   --   HGB 13.0  --  11.9* 11.2* 10.7*  HCT 42.9 36.8* 39.2 37.6* 34.8*  MCV 102.6*  --  102.6* 102.7* 100.3*  PLT 100*  --  74* 77* 84*    Basic Metabolic Panel: Recent Labs  Lab 04/28/18 1549 04/29/18 0342  04/30/18 0249 04/30/18 0530 04/30/18 1008 04/30/18 1327 05/01/18 0512  NA 142 142  --  142 142  --   --  144  K 5.3* 5.4*   < > 5.9* 6.3* 4.6 4.0 4.2  CL 107 110  --  113* 114*  --   --  114*  CO2 26 23  --  24 25  --   --  24  GLUCOSE 129* 135*  --  146* 118*  --   --  105*  BUN 23 30*  --  33* 35*  --   --  35*  CREATININE 1.32* 1.48*  --  1.29* 1.25*  --   --  1.11  CALCIUM 8.8* 8.1*  --  8.2* 8.3*  --   --  8.2*  MG  --  2.1  --   --   --   --   --   --   PHOS  --  4.9*  --   --   --   --   --   --    < > = values in this interval not displayed.   GFR: Estimated Creatinine Clearance: 42.5 mL/min (by C-G formula based on SCr of 1.11 mg/dL). Recent Labs  Lab 04/28/18 1549 04/29/18 0342 04/30/18 0249 05/01/18 0512  WBC 5.4 2.5* 6.7 8.6    Liver Function Tests: Recent Labs  Lab  04/28/18 1549 04/29/18 0342  AST 32 32  ALT 18 18  ALKPHOS 72 61  BILITOT 0.4 0.8  PROT 8.1 6.9  ALBUMIN 4.2 3.4*   No results for input(s): LIPASE, AMYLASE in the last 168 hours. No results for input(s): AMMONIA in the last 168 hours.  ABG    Component Value Date/Time   PHART 7.263 (L) 04/28/2018 2058   PCO2ART 53.7 (H) 04/28/2018 2058   PO2ART 82.0 (L) 04/28/2018 2058   HCO3 25.1 04/28/2018 2119   ACIDBASEDEF 3.3 (H) 04/28/2018 2119   O2SAT 62.8 04/28/2018 2119     Coagulation Profile: Recent Labs  Lab 04/28/18 2129  INR  0.94    Cardiac Enzymes: Recent Labs  Lab 04/28/18 1549 04/28/18 2125 04/29/18 0342 04/29/18 0900 04/30/18 0530  CKTOTAL  --   --   --   --  122  TROPONINI <0.03 0.04* 0.04* 0.04*  --     HbA1C: Hgb A1c MFr Bld  Date/Time Value Ref Range Status  04/15/2016 02:11 AM 5.2 4.8 - 5.6 % Final    Comment:    (NOTE)         Pre-diabetes: 5.7 - 6.4         Diabetes: >6.4         Glycemic control for adults with diabetes: <7.0     CBG: No results for input(s): GLUCAP in the last 168 hours.  Review of Systems:   Review of Systems  Constitutional: Negative.   HENT: Negative.   Eyes: Negative.   Respiratory: Positive for cough. Negative for sputum production and shortness of breath.   Cardiovascular: Negative.   Gastrointestinal: Negative.   Genitourinary: Negative.   Musculoskeletal: Negative.   Skin: Negative.   Endo/Heme/Allergies: Negative.      Past Medical History  He,  has a past medical history of Alcohol abuse, Anemia, Aneurysm ([place]), CKD (chronic kidney disease), stage III ([place]), COPD (chronic obstructive pulmonary disease) ([place]), Dysrhythmia, Fever of unknown origin (08/2016), History of atrial fibrillation, Hyperchloremia, Hypercholesteremia, Hyperpotassemia, Hypertension, Hypertensive renal disease, benign, Leg edema, PAD (peripheral artery disease) ([place]), Pneumonia, Pressure ulcer of left heel, Seizures ([place]), Tobacco use,  and Weight loss.   Surgical History    Past Surgical History:  Procedure Laterality Date  . ABDOMINAL AORTOGRAM W/LOWER EXTREMITY N/A 08/13/2016   Procedure: Abdominal Aortogram w/Lower Extremity;  Surgeon: Wellington Hampshire, MD;  Location: [facility];  Service: Cardiovascular;  Laterality: N/A;  . 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, 1996  . EYE SURGERY     December 2017  . PERIPHERAL VASCULAR BALLOON ANGIOPLASTY  08/13/2016   Procedure: Peripheral Vascular Balloon Angioplasty;  Surgeon: Wellington Hampshire, MD;  Location: [facility];  Service: Cardiovascular;;  Aborted     Social History   reports that he quit smoking about 2 years ago. His smoking use included cigarettes. He has a 14.00 pack-year smoking history. He has never used smokeless tobacco. He reports that he does not drink alcohol or use drugs.   Family History   His family history includes Alzheimer's disease in his sister; Cancer in his mother; Clotting disorder in his sister and sister; Congestive Heart Failure in his brother, father, and sister.

## 2018-05-02 LAB — BASIC METABOLIC PANEL
Anion gap: 8 (ref 5–15)
BUN: 29 mg/dL — ABNORMAL HIGH (ref 8–23)
CO2: 22 mmol/L (ref 22–32)
Calcium: 8.3 mg/dL — ABNORMAL LOW (ref 8.9–10.3)
Chloride: 116 mmol/L — ABNORMAL HIGH (ref 98–111)
Creatinine, Ser: 1.02 mg/dL (ref 0.61–1.24)
GFR calc Af Amer: >60 mL/min (ref >60)
GFR calc non Af Amer: >60 mL/min (ref >60)
Glucose, Bld: 82 mg/dL (ref 70–99)
Potassium: 3.9 mmol/L (ref 3.5–5.1)
Sodium: 146 mmol/L — ABNORMAL HIGH (ref 135–145)

## 2018-05-02 LAB — VITAMIN B1: Vitamin B1 (Thiamine): 218 nmol/L — ABNORMAL HIGH (ref 66.5–200.0)

## 2018-05-02 MED ORDER — PREDNISONE 20 MG PO TABS: ORAL_TABLET | ORAL | 0 refills | Status: DC

## 2018-05-02 MED ORDER — HYDRALAZINE HCL 50 MG PO TABS: 50.0000 mg | ORAL_TABLET | Freq: Three times a day (TID) | ORAL | 0 refills | Status: DC

## 2018-05-02 MED ORDER — GUAIFENESIN ER 600 MG PO TB12: 600.0000 mg | ORAL_TABLET | Freq: Two times a day (BID) | ORAL | 0 refills | Status: DC

## 2018-05-02 MED ORDER — DOXYCYCLINE HYCLATE 100 MG PO TABS: 100.0000 mg | ORAL_TABLET | Freq: Two times a day (BID) | ORAL | 0 refills | Status: AC

## 2018-05-02 MED ORDER — ALBUTEROL SULFATE HFA 108 (90 BASE) MCG/ACT IN AERS: 2.0000 | INHALATION_SPRAY | Freq: Four times a day (QID) | RESPIRATORY_TRACT | 2 refills | Status: AC | PRN

## 2018-05-02 MED ORDER — GUAIFENESIN 100 MG/5ML PO SOLN: 5.0000 mL | ORAL | 0 refills | Status: DC | PRN

## 2018-05-02 MED ORDER — THIAMINE HCL 100 MG PO TABS: 100.0000 mg | ORAL_TABLET | Freq: Every day | ORAL | 3 refills | Status: AC

## 2018-05-02 MED ORDER — AMLODIPINE BESYLATE 10 MG PO TABS: 10.0000 mg | ORAL_TABLET | Freq: Every day | ORAL | 0 refills | Status: DC

## 2018-05-02 NOTE — Discharge Summary (Signed)
[facility]  Physician Discharge Summary   Patient ID: [name] MRN: [id number] DOB/AGE: [birth year] 78 y.o.  Admit date: 04/28/2018 Discharge date: 05/02/2018  PCP: [name], NP  DISCHARGE DIAGNOSES:  Acute respiratory failure with hypoxia and hypercapnia, resolved Acute COPD exacerbation, improved Metapneumovirus Acute renal failure on chronic kidney disease stage III, stable Essential hypertension Chronic diastolic CHF Thrombocytopenia, stable Macrocytic anemia Peripheral vascular disease History of alcohol abuse History of seizure disorder History of dementia  RECOMMENDATIONS FOR OUTPATIENT FOLLOW UP: 1. ABIs done in the hospital suggested severe arterial disease in the left lower extremity.  See below.  2. Patient counseled to stop smoking and drinking alcohol 3. Patient's ARB discontinued due to hyperkalemia. 4. Recheck thyroid function test in 2 to 3 weeks. 5. Home health has been ordered  DISCHARGE CONDITION: fair  Diet recommendation: Heart healthy  Filed Weights   04/28/18 1359 04/28/18 2030 04/30/18 0500  Weight: 59 kg 53.8 kg 55.7 kg    INITIAL HISTORY: 78 y.o.malewith medical history significant of COPD, dementia, HTN, hx of ETOH abuse,anemiadue tomyelodysplasia., CKD, P.A. Fib,HLD,PAD, history of seizures (remote, not in the past 15 years),tobacco abuse, brainaneurysm status post clipping entered with cough shortness of breath.  He was seen initially by his primary care provider and it appears that he was given antibiotics and steroids.  There was no improvement in his symptoms so he was brought into the hospital.  In the emergency department he was found to have acute hypercapnic respiratory failure.  He was placed on BiPAP and was hospitalized.  Consultants: Pulmonology  Procedures: None   HOSPITAL COURSE:   Acute respiratory failure with hypercapnia and hypoxia Secondary to COPD exacerbation.  He had to be placed  on BiPAP initially.  He started improving.  Now he saturating normal on room air.  Patient was seen by pulmonology as well.  Acute COPD exacerbation/metapneumovirus Was given systemic steroids, antibiotics, nebulizer treatments.  Influenza PCR negative.  Respiratory viral panel positive for metapneumovirus.  Patient has significantly improved.  Still has some cough which will get better over the.  Of time.  He will be discharged on tapering doses of steroids and albuterol inhaler.  May benefit from outpatient referral to pulmonology.  He was strongly advised to stop smoking.  Acute renal failure with hyperkalemia/CKD Stage III  BUN and creatinine noted to be elevated at the time of admission.  Creatinine has improved and now close to baseline.    Patient's ARB and potassium supplement has been discontinued.  Essential hypertension Patient will be sent on a new prescription for amlodipine.  He will be on a higher dose of hydralazine.  ARB discontinued due to hyperkalemia.  Outpatient follow-up.     Chronic diastolic CHF Currently appears to be euvolemic.  Echocardiogram from 2018 showed normal systolic function.  Grade 2 diastolic dysfunction was noted.  Thrombocytopenia Reason for low platelet count not entirely clear.  Counts are low but stable.  No evidence of bleeding.   Macrocytic anemia No evidence of bleeding.  Hemoglobin is stable for the most part.  Anemia panel reviewed.  No evidence for iron deficiency.  Vitamin B12 2330.  Folate 16.1.  TSH noted to be 0.26.  TSH can be rechecked in few weeks time.  Peripheral vascular disease Does not appear to be an active issue currently.  ABIs were done which showed evidence for severe left lower extremity arterial disease.  No acute ischemia present at this time.    Old  records were reviewed.  It appears that patient underwent an attempt at revascularization in 2018 which was not successful.  This was done by Dr. Fletcher Anon.  Does not appear  that patient has followed up with Dr. Fletcher Anon since then.  This can be discussed with him at the outpatient visit by his PCP.   History of alcohol abuse Patient advised to stop drinking alcohol.  He did not have any symptoms or signs of withdrawal here in the hospital.  History of seizure disorder Continue Keppra.  History of dementia Stable.  Continue Depakote.  Overall stable.  Okay for discharge home today.  Discussed with his cousin who is his power of attorney.  Home health has been ordered as recommended by physical therapy.      PERTINENT LABS:  The results of significant diagnostics from this hospitalization (including imaging, microbiology, ancillary and laboratory) are listed below for reference.    Microbiology: Recent Results (from the past 240 hour(s))  MRSA PCR Screening     Status: None   Collection Time: 04/28/18  9:02 PM  Result Value Ref Range Status   MRSA by PCR NEGATIVE NEGATIVE Final    Comment:        The GeneXpert MRSA Assay (FDA approved for NASAL specimens only), is one component of a comprehensive MRSA colonization surveillance program. It is not intended to diagnose MRSA infection nor to guide or monitor treatment for MRSA infections. Performed at [facility], [place] [address], [place], [place] [address]   Respiratory Panel by PCR     Status: Abnormal   Collection Time: 04/29/18 12:30 AM  Result Value Ref Range Status   Adenovirus NOT DETECTED NOT DETECTED Final   Coronavirus 229E NOT DETECTED NOT DETECTED Final   Coronavirus HKU1 NOT DETECTED NOT DETECTED Final   Coronavirus NL63 NOT DETECTED NOT DETECTED Final   Coronavirus OC43 NOT DETECTED NOT DETECTED Final   Metapneumovirus DETECTED (A) NOT DETECTED Final   Rhinovirus / Enterovirus NOT DETECTED NOT DETECTED Final   Influenza A NOT DETECTED NOT DETECTED Final   Influenza B NOT DETECTED NOT DETECTED Final   Parainfluenza Virus 1 NOT DETECTED NOT DETECTED Final    Parainfluenza Virus 2 NOT DETECTED NOT DETECTED Final   Parainfluenza Virus 3 NOT DETECTED NOT DETECTED Final   Parainfluenza Virus 4 NOT DETECTED NOT DETECTED Final   Respiratory Syncytial Virus NOT DETECTED NOT DETECTED Final   Bordetella pertussis NOT DETECTED NOT DETECTED Final   Chlamydophila pneumoniae NOT DETECTED NOT DETECTED Final   Mycoplasma pneumoniae NOT DETECTED NOT DETECTED Final    Comment: Performed at [facility], [place] [address], [place], [place] [address]     Labs: Basic Metabolic Panel: Recent Labs  Lab 04/29/18 [phone]  04/30/18 0249 04/30/18 0530 04/30/18 1008 04/30/18 1327 05/01/18 0512 05/02/18 0453  NA 142  --  142 142  --   --  144 146*  K 5.4*   < > 5.9* 6.3* 4.6 4.0 4.2 3.9  CL 110  --  113* 114*  --   --  114* 116*  CO2 23  --  24 25  --   --  24 22  GLUCOSE 135*  --  146* 118*  --   --  105* 82  BUN 30*  --  33* 35*  --   --  35* 29*  CREATININE 1.48*  --  1.29* 1.25*  --   --  1.11 1.02  CALCIUM 8.1*  --  8.2* 8.3*  --   --  8.2* 8.3*  MG 2.1  --   --   --   --   --   --   --   PHOS 4.9*  --   --   --   --   --   --   --    < > = values in this interval not displayed.   Liver Function Tests: Recent Labs  Lab 04/28/18 1549 04/29/18 0342  AST 32 32  ALT 18 18  ALKPHOS 72 61  BILITOT 0.4 0.8  PROT 8.1 6.9  ALBUMIN 4.2 3.4*   CBC: Recent Labs  Lab 04/28/18 1549 04/28/18 2129 04/29/18 0342 04/30/18 0249 05/01/18 0512 05/01/18 1255  WBC 5.4  --  2.5* 6.7 8.6 8.9  NEUTROABS 3.1  --   --   --   --   --   HGB 13.0  --  11.9* 11.2* 10.7* 11.6*  HCT 42.9 36.8* 39.2 37.6* 34.8* 37.4*  MCV 102.6*  --  102.6* 102.7* 100.3* 101.1*  PLT 100*  --  74* 77* 84* 92*   Cardiac Enzymes: Recent Labs  Lab 04/28/18 1549 04/28/18 2125 04/29/18 0342 04/29/18 0900 04/30/18 0530  CKTOTAL  --   --   --   --  122  TROPONINI <0.03 0.04* 0.04* 0.04*  --     IMAGING STUDIES Ct Head Wo Contrast  Result Date: 04/28/2018 CLINICAL DATA:   Altered mental status EXAM: CT HEAD WITHOUT CONTRAST TECHNIQUE: Contiguous axial images were obtained from the base of the skull through the vertex without intravenous contrast. COMPARISON:  Head CT 08/14/2016 FINDINGS: Brain: There is no mass, hemorrhage or extra-axial collection. There is generalized atrophy without lobar predilection. Hypodensity of the white matter is most commonly associated with chronic microvascular disease. Right frontal and anterior left temporal encephalomalacia is unchanged. Vascular: Aneurysm clip adjacent to the left carotid terminus. Skull: Old left pterional craniotomy and right frontal craniotomy. Sinuses/Orbits: No fluid levels or advanced mucosal thickening of the visualized paranasal sinuses. No mastoid or middle ear effusion. The orbits are normal. IMPRESSION: Unchanged examination without acute intracranial abnormality. Electronically Signed   By: Ulyses Jarred M.D.   On: 04/28/2018 20:40   Dg Chest Port 1 View  Result Date: 04/28/2018 CLINICAL DATA:  Cough.  History of hypertension and COPD. EXAM: PORTABLE CHEST 1 VIEW COMPARISON:  Radiographs 08/03/2017.  CT 10/17/2016. FINDINGS: 1547 hours. Two AP views obtained. The heart size and mediastinal contours are stable. There is mild cardiomegaly with atherosclerosis of the aorta and great vessels. The lungs are clear. There is no pleural effusion or pneumothorax. IVC filter noted. IMPRESSION: Stable chest.  No active cardiopulmonary process. Electronically Signed   By: Richardean Sale M.D.   On: 04/28/2018 16:06    DISCHARGE EXAMINATION: Vitals:   05/01/18 2054 05/01/18 2244 05/02/18 0504 05/02/18 0748  BP: (!) 170/75  (!) 167/72   Pulse: 72  (!) 58   Resp: 16  14   Temp: 99.2 F (37.3 C)  98.1 F (36.7 C)   TempSrc: Oral  Oral   SpO2: 94% 97% 94% 91%  Weight:      Height:       General appearance: Awake alert.  In no distress.  Distracted Resp: Coarse breath sounds.  Normal effort.  No wheezing.   Cardio:  S1-S2 is normal regular.  No S3-S4.  No rubs murmurs or bruit GI: Abdomen is soft.  Nontender nondistended.  Bowel sounds  are present normal.  No masses organomegaly   DISPOSITION: Home with family  Discharge Instructions    Call MD for:  difficulty breathing, headache or visual disturbances   Complete by:  As directed    Call MD for:  extreme fatigue   Complete by:  As directed    Call MD for:  persistant dizziness or light-headedness   Complete by:  As directed    Call MD for:  persistant nausea and vomiting   Complete by:  As directed    Call MD for:  severe uncontrolled pain   Complete by:  As directed    Call MD for:  temperature >100.4   Complete by:  As directed    Diet - low sodium heart healthy   Complete by:  As directed    Discharge instructions   Complete by:  As directed    Please stop smoking cigarettes and drinking alcohol.  Please take your medications as prescribed.  Follow-up with your primary care provider within 1 week.  You were cared for by a hospitalist during your hospital stay. If you have any questions about your discharge medications or the care you received while you were in the hospital after you are discharged, you can call the unit and asked to speak with the hospitalist on call if the hospitalist that took care of you is not available. Once you are discharged, your primary care physician will handle any further medical issues. Please note that NO REFILLS for any discharge medications will be authorized once you are discharged, as it is imperative that you return to your primary care physician (or establish a relationship with a primary care physician if you do not have one) for your aftercare needs so that they can reassess your need for medications and monitor your lab values. If you do not have a primary care physician, you can call [phone] for a physician referral.   Increase activity slowly   Complete by:  As directed         Allergies as of  05/02/2018      Reactions   Diltiazem Rash, Other (See Comments)   Blisters   Penicillins Other (See Comments)   Unknown Childhood allergy  Has patient had a PCN reaction causing immediate rash, facial/tongue/throat swelling, SOB or lightheadedness with hypotension: unknown Has patient had a PCN reaction causing severe rash involving mucus membranes or skin necrosis: unknown Has patient had a PCN reaction that required hospitalization unknown Has patient had a PCN reaction occurring within the last 10 years:no If all of the above answers are "NO", then may proceed with Cephalosporin use.      Medication List    STOP taking these medications   losartan 25 MG tablet Commonly known as:  COZAAR   POTASSIUM PO   predniSONE 10 MG (21) Tbpk tablet Commonly known as:  STERAPRED UNI-PAK 21 TAB Replaced by:  predniSONE 20 MG tablet     TAKE these medications   albuterol 108 (90 Base) MCG/ACT inhaler Commonly known as:  PROVENTIL HFA;VENTOLIN HFA Inhale 2 puffs into the lungs every 6 (six) hours as needed for wheezing or shortness of breath.   amLODipine 10 MG tablet Commonly known as:  NORVASC Take 1 tablet (10 mg total) by mouth daily. Start taking on:  May 03, 2018   aspirin EC 81 MG tablet Take 1 tablet (81 mg total) by mouth daily.   atorvastatin 20 MG tablet Commonly known as:  LIPITOR TAKE 1 TABLET  BY MOUTH EVERY DAY   carvedilol 3.125 MG tablet Commonly known as:  COREG Take 1 tablet (3.125 mg total) by mouth 2 (two) times daily with a meal.   divalproex 250 MG DR tablet Commonly known as:  DEPAKOTE TAKE ONE TABLET BY MOUTH THREE TIMES DAILY FOR SEIZURES What changed:  See the new instructions.   doxycycline 100 MG tablet Commonly known as:  VIBRA-TABS Take 1 tablet (100 mg total) by mouth every 12 (twelve) hours for 2 days. What changed:  when to take this   EYE DROPS OP Apply 1 drop to eye daily as needed (dry eyes).   guaiFENesin 600 MG 12 hr  tablet Commonly known as:  MUCINEX Take 1 tablet (600 mg total) by mouth 2 (two) times daily.   guaiFENesin 100 MG/5ML Soln Commonly known as:  ROBITUSSIN Take 5 mLs (100 mg total) by mouth every 4 (four) hours as needed for cough or to loosen phlegm.   hydrALAZINE 50 MG tablet Commonly known as:  APRESOLINE Take 1 tablet (50 mg total) by mouth 3 (three) times daily. What changed:  when to take this   levETIRAcetam 500 MG tablet Commonly known as:  KEPPRA TAKE 1 TABLET BY MOUTH TWICE A DAY   memantine 10 MG tablet Commonly known as:  NAMENDA TAKE 1 TABLET BY MOUTH TWICE A DAY   predniSONE 20 MG tablet Commonly known as:  DELTASONE Take 1 tablet twice daily for 3 days followed by 1 tablet once daily for 3 days and then stop Replaces:  predniSONE 10 MG (21) Tbpk tablet   THEREMS-M Tabs TAKE 1 TABLET BY MOUTH EVERY DAY   thiamine 100 MG tablet Commonly known as:  [employer] B-1 Take 1 tablet (100 mg total) by mouth daily.   VITAMIN B 12 PO Take 1 tablet by mouth daily.        Follow-up Information    [name], NP. Schedule an appointment as soon as possible for a visit in 1 week(s).   Specialty:  Geriatric Medicine Contact information: [place] [place] [address] [phone]           TOTAL DISCHARGE TIME: 63 minutes  [facility] Pager on [email or web address]  05/02/2018, 11:44 AM

## 2018-05-02 NOTE — Discharge Instructions (Signed)
Chronic Obstructive Pulmonary Disease Chronic obstructive pulmonary disease (COPD) is a long-term (chronic) lung problem. When you have COPD, it is hard for air to get in and out of your lungs. Usually the condition gets worse over time, and your lungs will never return to normal. There are things you can do to keep yourself as healthy as possible.  Your doctor may treat your condition with: ? Medicines. ? Oxygen. ? Lung surgery.  Your doctor may also recommend: ? Rehabilitation. This includes steps to make your body work better. It may involve a team of specialists. ? Quitting smoking, if you smoke. ? Exercise and changes to your diet. ? Comfort measures (palliative care). Follow these instructions at home: Medicines  Take over-the-counter and prescription medicines only as told by your doctor.  Talk to your doctor before taking any cough or allergy medicines. You may need to avoid medicines that cause your lungs to be dry. Lifestyle  If you smoke, stop. Smoking makes the problem worse. If you need help quitting, ask your doctor.  Avoid being around things that make your breathing worse. This may include smoke, chemicals, and fumes.  Stay active, but remember to rest as well.  Learn and use tips on how to relax.  Make sure you get enough sleep. Most adults need at least 7 hours of sleep every night.  Eat healthy foods. Eat smaller meals more often. Rest before meals. Controlled breathing Learn and use tips on how to control your breathing as told by your doctor. Try:  Breathing in (inhaling) through your nose for 1 second. Then, pucker your lips and breath out (exhale) through your lips for 2 seconds.  Putting one hand on your belly (abdomen). Breathe in slowly through your nose for 1 second. Your hand on your belly should move out. Pucker your lips and breathe out slowly through your lips. Your hand on your belly should move in as you breathe out.  Controlled coughing Learn  and use controlled coughing to clear mucus from your lungs. Follow these steps: 1. Lean your head a little forward. 2. Breathe in deeply. 3. Try to hold your breath for 3 seconds. 4. Keep your mouth slightly open while coughing 2 times. 5. Spit any mucus out into a tissue. 6. Rest and do the steps again 1 or 2 times as needed. General instructions  Make sure you get all the shots (vaccines) that your doctor recommends. Ask your doctor about a flu shot and a pneumonia shot.  Use oxygen therapy and pulmonary rehabilitation if told by your doctor. If you need home oxygen therapy, ask your doctor if you should buy a tool to measure your oxygen level (oximeter).  Make a COPD action plan with your doctor. This helps you to know what to do if you feel worse than usual.  Manage any other conditions you have as told by your doctor.  Avoid going outside when it is very hot, cold, or humid.  Avoid people who have a sickness you can catch (contagious).  Keep all follow-up visits as told by your doctor. This is important. Contact a doctor if:  You cough up more mucus than usual.  There is a change in the color or thickness of the mucus.  It is harder to breathe than usual.  Your breathing is faster than usual.  You have trouble sleeping.  You need to use your medicines more often than usual.  You have trouble doing your normal activities such as getting dressed   or walking around the house. Get help right away if:  You have shortness of breath while resting.  You have shortness of breath that stops you from: ? Being able to talk. ? Doing normal activities.  Your chest hurts for longer than 5 minutes.  Your skin color is more blue than usual.  Your pulse oximeter shows that you have low oxygen for longer than 5 minutes.  You have a fever.  You feel too tired to breathe normally. Summary  Chronic obstructive pulmonary disease (COPD) is a long-term lung problem.  The way your  lungs work will never return to normal. Usually the condition gets worse over time. There are things you can do to keep yourself as healthy as possible.  Take over-the-counter and prescription medicines only as told by your doctor.  If you smoke, stop. Smoking makes the problem worse. This information is not intended to replace advice given to you by your health care provider. Make sure you discuss any questions you have with your health care provider. Document Released: 09/10/2007 Document Revised: 04/28/2016 Document Reviewed: 04/28/2016 Elsevier Interactive Patient Education  2019 Elsevier Inc.  

## 2018-05-02 NOTE — Care Management Important Message (Signed)
Important Message  Patient Details  Name: [name] MRN: [id number] Date of Birth: Oct 31, 1940   Medicare Important Message Given:  Yes    Erenest Rasher, RN 05/02/2018, 12:15 PM

## 2018-05-02 NOTE — Progress Notes (Signed)
NAME:  [name], MRN:  [id number], DOB:  May 26, 1940, LOS: 4 ADMISSION DATE:  04/28/2018, CONSULTATION DATE: 04/29/2018 REFERRING MD: Dr. Roel Cluck, CHIEF COMPLAINT: Shortness of breath/cough  Brief History   Patient presented to the hospital with a few days of persistent cough Has been coughing for at least a couple of months Was treated with a course of antibiotics and steroids-finish the course but with nonresolution Progressive symptoms led to presentation to the hospital He has a history of COPD, dementia, hypertension, history of EtOH abuse  History of present illness   Progressive symptoms despite completing course of antibiotics and steroids recently Significant shortness of breath led to presentation to the hospital He has a known history of advanced chronic obstructive pulmonary disease History of grade 2 diastolic heart failure  Past Medical History   Past Medical History:  Diagnosis Date  . Alcohol abuse   . Anemia   . Aneurysm ([place])   . CKD (chronic kidney disease), stage III ([place])   . COPD (chronic obstructive pulmonary disease) ([place])   . Dysrhythmia   . Fever of unknown origin 08/2016  . History of atrial fibrillation   . Hyperchloremia   . Hypercholesteremia   . Hyperpotassemia   . Hypertension   . Hypertensive renal disease, benign   . Leg edema   . PAD (peripheral artery disease) ([place])   . Pneumonia   . Pressure ulcer of left heel   . Seizures ([place])    has not had a seizure in 15 yrs  . Tobacco use   . Weight loss    Significant Hospital Events    Improvement in symptoms since being in the hospital He was started on BiPAP-improvement in hypercapnic failure and mental status Currently on room air and doing well  Consults:  PCCM 04/29/2018  Procedures:  None  Significant Diagnostic Tests:  Chest x-ray on 04/28/2018, no acute infiltrate  Micro Data:  Respiratory viral panel positive for metapneumovirus MRSA by PCR negative Influenza PCR  negative  Antimicrobials:  Doxycycline 04/28/2018>>  Interim history/subjective:  Successfully weaned off BiPAP Awake alert, feels generally better Decreasing cough with no expectoration On room air  Objective   Blood pressure (!) 167/72, pulse (!) 58, temperature 98.1 F (36.7 C), temperature source Oral, resp. rate 14, height 5\' 6"  (1.676 m), weight 55.7 kg, SpO2 91 %.        Intake/Output Summary (Last 24 hours) at 05/02/2018 1159 Last data filed at 05/02/2018 0504 Gross per 24 hour  Intake 420 ml  Output 400 ml  Net 20 ml   Filed Weights   04/28/18 1359 04/28/18 2030 04/30/18 0500  Weight: 59 kg 53.8 kg 55.7 kg    Examination: General: Awake, oriented to person place HENT: Moist oral mucosa Lungs:no crepitations, fair air movement Cardiovascular: S1-S2 Abdomen: Bowel sounds appreciated  Resolved Hospital Problem list     Assessment & Plan:  .  COPD exacerbation -Continue bronchodilators, complete course of antibiotics -Wean off prednisone in a few days  -Continue doxycycline and complete course -Secondary to metapneumovirus infection-continue supportive measures -Appears to be stabilizing unclear getting close to his usual  .  Hypoxemic/hypercarbic respiratory failure -Successfully weaned off BiPAP -Continue bronchodilator -Currently on DuoNeb -Resolved  .  Advanced COPD -Previous pulmonary function study does reveal very advanced obstructive lung disease, severe reduction in diffusing capacity   -Bronchodilators -Steroids-short-term -Prednisone can be stopped within 2 to 3 days  .  Acute respiratory failure -Progressive symptoms led to presentation, probably  related to very poor reserves -We will continue bronchodilators, steroids, course of antibiotics  .  Chronic diastolic heart failure -Does appear euvolemic at present -Last echocardiogram was January 2018 showing an ejection fraction of 60 to 94%, grade 2 diastolic dysfunction  .  History of  alcohol abuse -Unclear how much he drinks  .  History of dementia -Continue usual medications  Seems to be doing better Complete steroids, complete antibiotics Encouraged to continue using bronchodilator  Stable to be discharged from a pulmonary perspective Risk of decompensation is high if he goes back to drinking and not compliant with ongoing treatment We will be glad to follow him as outpatient   Best practice:  Diet: Regular diet Pain/Anxiety/Delirium protocol (if indicated): As needed VAP protocol (if indicated): DVT prophylaxis: SCD GI prophylaxis:  Glucose control: Mobility: Ambulate as tolerated Code Status: Full code Family Communication: No family at bedside Disposition: May be transferred to medical floor from a pulmonary perspective  Labs   CBC: Recent Labs  Lab 04/28/18 1549 04/28/18 2129 04/29/18 0342 04/30/18 0249 05/01/18 0512 05/01/18 1255  WBC 5.4  --  2.5* 6.7 8.6 8.9  NEUTROABS 3.1  --   --   --   --   --   HGB 13.0  --  11.9* 11.2* 10.7* 11.6*  HCT 42.9 36.8* 39.2 37.6* 34.8* 37.4*  MCV 102.6*  --  102.6* 102.7* 100.3* 101.1*  PLT 100*  --  74* 77* 84* 92*    Basic Metabolic Panel: Recent Labs  Lab 04/29/18 0342  04/30/18 0249 04/30/18 0530 04/30/18 1008 04/30/18 1327 05/01/18 0512 05/02/18 0453  NA 142  --  142 142  --   --  144 146*  K 5.4*   < > 5.9* 6.3* 4.6 4.0 4.2 3.9  CL 110  --  113* 114*  --   --  114* 116*  CO2 23  --  24 25  --   --  24 22  GLUCOSE 135*  --  146* 118*  --   --  105* 82  BUN 30*  --  33* 35*  --   --  35* 29*  CREATININE 1.48*  --  1.29* 1.25*  --   --  1.11 1.02  CALCIUM 8.1*  --  8.2* 8.3*  --   --  8.2* 8.3*  MG 2.1  --   --   --   --   --   --   --   PHOS 4.9*  --   --   --   --   --   --   --    < > = values in this interval not displayed.   GFR: Estimated Creatinine Clearance: 46.3 mL/min (by C-G formula based on SCr of 1.02 mg/dL). Recent Labs  Lab 04/29/18 0342 04/30/18 0249 05/01/18 0512  05/01/18 1255  WBC 2.5* 6.7 8.6 8.9    Liver Function Tests: Recent Labs  Lab 04/28/18 1549 04/29/18 0342  AST 32 32  ALT 18 18  ALKPHOS 72 61  BILITOT 0.4 0.8  PROT 8.1 6.9  ALBUMIN 4.2 3.4*   No results for input(s): LIPASE, AMYLASE in the last 168 hours. No results for input(s): AMMONIA in the last 168 hours.  ABG    Component Value Date/Time   PHART 7.263 (L) 04/28/2018 2058   PCO2ART 53.7 (H) 04/28/2018 2058   PO2ART 82.0 (L) 04/28/2018 2058   HCO3 25.1 04/28/2018 2119   ACIDBASEDEF 3.3 (H) 04/28/2018 2119  O2SAT 62.8 04/28/2018 2119     Coagulation Profile: Recent Labs  Lab 04/28/18 2129  INR 0.94    Cardiac Enzymes: Recent Labs  Lab 04/28/18 1549 04/28/18 2125 04/29/18 0342 04/29/18 0900 04/30/18 0530  CKTOTAL  --   --   --   --  122  TROPONINI <0.03 0.04* 0.04* 0.04*  --     HbA1C: Hgb A1c MFr Bld  Date/Time Value Ref Range Status  04/15/2016 02:11 AM 5.2 4.8 - 5.6 % Final    Comment:    (NOTE)         Pre-diabetes: 5.7 - 6.4         Diabetes: >6.4         Glycemic control for adults with diabetes: <7.0    CBG: No results for input(s): GLUCAP in the last 168 hours.  Review of Systems:   Review of Systems  Constitutional: Negative.   HENT: Negative.   Respiratory: Positive for cough. Negative for sputum production and shortness of breath.   Cardiovascular: Negative.   Skin: Negative.    Past Medical History  He,  has a past medical history of Alcohol abuse, Anemia, Aneurysm ([place]), CKD (chronic kidney disease), stage III ([place]), COPD (chronic obstructive pulmonary disease) ([place]), Dysrhythmia, Fever of unknown origin (08/2016), History of atrial fibrillation, Hyperchloremia, Hypercholesteremia, Hyperpotassemia, Hypertension, Hypertensive renal disease, benign, Leg edema, PAD (peripheral artery disease) ([place]), Pneumonia, Pressure ulcer of left heel, Seizures ([place]), Tobacco use, and Weight loss.   Surgical History    Past Surgical  History:  Procedure Laterality Date  . ABDOMINAL AORTOGRAM W/LOWER EXTREMITY N/A 08/13/2016   Procedure: Abdominal Aortogram w/Lower Extremity;  Surgeon: Wellington Hampshire, MD;  Location: [facility];  Service: Cardiovascular;  Laterality: N/A;  . 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, 1996  . EYE SURGERY     December 2017  . PERIPHERAL VASCULAR BALLOON ANGIOPLASTY  08/13/2016   Procedure: Peripheral Vascular Balloon Angioplasty;  Surgeon: Wellington Hampshire, MD;  Location: [facility];  Service: Cardiovascular;;  Aborted     Social History   reports that he quit smoking about 2 years ago. His smoking use included cigarettes. He has a 14.00 pack-year smoking history. He has never used smokeless tobacco. He reports that he does not drink alcohol or use drugs.   Family History   His family history includes Alzheimer's disease in his sister; Cancer in his mother; Clotting disorder in his sister and sister; Congestive Heart Failure in his brother, father, and sister.

## 2018-05-02 NOTE — Care Management Note (Signed)
Case Management Note  Patient Details  Name: CIRE CLUTE MRN: [id number] Date of Birth: 10/25/1940  Subjective/Objective:   COPD exac, CHF                 Action/Plan: NCM spoke to pt and sister at bedside. Offered choice for HH/CMS list provided and placed on chart. Pt states he had [facility] in the past. He has a cane at home. States he lives with brother at home. His POA, Currie [place] # [phone]. Contacted [facility] liaison with new referral.   Expected Discharge Date:  05/02/18               Expected Discharge Plan:  [place]  In-House Referral:  NA  Discharge planning Services  CM Consult  Post Acute Care Choice:  Home Health Choice offered to:  Patient  DME Arranged:  N/A DME Agency:  NA  HH Arranged:  PT, OT, RN [place] Agency:  [facility]  Status of Service:  Completed, signed off  If discussed at [place] of Stay Meetings, dates discussed:    Additional Comments:  Erenest Rasher, RN 05/02/2018, 12:47 PM

## 2018-05-03 ENCOUNTER — Telehealth: Payer: Self-pay | Admitting: *Deleted

## 2018-05-03 NOTE — Telephone Encounter (Signed)
Transition Care Management Follow-up Telephone Call  Date of discharge and from where: 05/02/2018 from [facility]  How have you been since you were released from the hospital? Still in bed but feels better  Any questions or concerns? No   Items Reviewed:  Did the pt receive and understand the discharge instructions provided? Yes   Medications obtained and verified? Yes   Any new allergies since your discharge? No   Dietary orders reviewed? Yes  Do you have support at home? Yes  Niece and sister  Other (ie: DME, Home Health, etc) No Home Health  Functional Questionnaire: (I = Independent and D = Dependent) ADL's: I  Bathing/Dressing- I   Meal Prep- I. Has Niece and sister to help  Eating- I  Maintaining continence- I  Transferring/Ambulation- I  Managing Meds- I   Follow up appointments reviewed:    PCP Hospital f/u appt confirmed? Yes  Scheduled to see [name] on Tues 1/28 @ 10:15.  [facility] f/u appt confirmed? No    Are transportation arrangements needed? No   If their condition worsens, is the pt aware to call  their PCP or go to the ED? Yes  Was the patient provided with contact information for the PCP's office or ED? Yes  Was the pt encouraged to call back with questions or concerns? Yes

## 2018-05-04 ENCOUNTER — Ambulatory Visit (INDEPENDENT_AMBULATORY_CARE_PROVIDER_SITE_OTHER): Payer: Medicare Other | Admitting: Nurse Practitioner

## 2018-05-04 ENCOUNTER — Encounter: Payer: Self-pay | Admitting: Nurse Practitioner

## 2018-05-04 VITALS — BP 134/70 | HR 70 | Temp 97.4°F | Ht 68.0 in | Wt 128.2 lb

## 2018-05-04 DIAGNOSIS — N183 Chronic kidney disease, stage 3 unspecified: Secondary | ICD-10-CM

## 2018-05-04 DIAGNOSIS — R7989 Other specified abnormal findings of blood chemistry: Secondary | ICD-10-CM

## 2018-05-04 DIAGNOSIS — D696 Thrombocytopenia, unspecified: Secondary | ICD-10-CM

## 2018-05-04 DIAGNOSIS — R634 Abnormal weight loss: Secondary | ICD-10-CM

## 2018-05-04 DIAGNOSIS — I10 Essential (primary) hypertension: Secondary | ICD-10-CM

## 2018-05-04 DIAGNOSIS — F0391 Unspecified dementia with behavioral disturbance: Secondary | ICD-10-CM | POA: Diagnosis not present

## 2018-05-04 DIAGNOSIS — I739 Peripheral vascular disease, unspecified: Secondary | ICD-10-CM

## 2018-05-04 DIAGNOSIS — J449 Chronic obstructive pulmonary disease, unspecified: Secondary | ICD-10-CM | POA: Diagnosis not present

## 2018-05-04 DIAGNOSIS — I480 Paroxysmal atrial fibrillation: Secondary | ICD-10-CM

## 2018-05-04 MED ORDER — ZOSTER VAC RECOMB ADJUVANTED 50 MCG/0.5ML IM SUSR: 0.5000 mL | Freq: Once | INTRAMUSCULAR | 1 refills | Status: AC

## 2018-05-04 MED ORDER — TETANUS-DIPHTH-ACELL PERTUSSIS 5-2.5-18.5 LF-MCG/0.5 IM SUSP: 0.5000 mL | Freq: Once | INTRAMUSCULAR | 0 refills | Status: AC

## 2018-05-04 NOTE — Patient Instructions (Addendum)
To follow up with: [name], [facility] Address: [address], [place], [place] [address] Phone: [phone] Marshell Garfinkel MD- [facility] Address: [address], [place], [place] [address] Phone: [phone] hematologist-  G. Julieanne Manson, MD Address: [place], [place], [place] [address] Phone: [phone]  Please QUIT smoking.   Follow up in 3 weeks for fasting lab work Follow up in 3 months for routine follow up

## 2018-05-04 NOTE — Progress Notes (Signed)
ABI/TBI preliminary result is available in CV PROC under Chart review.  Namir Neto H Alden Feagan(RDMS RVT) 05/04/18 10:08 AM

## 2018-05-04 NOTE — Progress Notes (Signed)
Careteam: Patient Care Team: [name], NP as PCP - General (Nurse Practitioner)  Advanced Directive information    Allergies  Allergen Reactions  . Diltiazem Rash and Other (See Comments)    Blisters  . Penicillins Other (See Comments)    Unknown Childhood allergy  Has patient had a PCN reaction causing immediate rash, facial/tongue/throat swelling, SOB or lightheadedness with hypotension: unknown Has patient had a PCN reaction causing severe rash involving mucus membranes or skin necrosis: unknown Has patient had a PCN reaction that required hospitalization unknown Has patient had a PCN reaction occurring within the last 10 years:no If all of the above answers are "NO", then may proceed with Cephalosporin use.     Chief Complaint  Patient presents with  . [facility] follow-up 04/28/2018-05/02/2018 for acute respiratory failure, here with Bartolo Darter   . Immunizations    RX for Tdap and Shingrix printed for patient to receive at local pharmacy      HPI: Patient is a 78 y.o. male seen in the office today for hospital follow up. Pt with hx of COPD, dementia, HTN, hx of ETOH abuse,anemiadue tomyelodysplasia., CKD, P.A. Fib,HLD,PAD, history of seizures (remote, not in the past 15 years),tobacco abuse, brainaneurysm status post clipping who went to the hospital with cough shortness of breath. In the emergency department he was found to have acute hypercapnic respiratory failure. He was placed on BiPAP and was hospitalized. Respiratory viral panel positive for metapneumovirus. Improvement with O2, steroid taper and albuterol inhaler which he was discharged with. Also given doxycyline 100 mg by mouth twice daily. Reports breathing has been "great". Using albuterol occasionally but not needing routinely.   HTN- new Rx for amlodipine given and losartan stopped due to hyperkalemia. Hydralazine was also increased to three times daily (he was only  taking twice daily)  Thrombocytopenia noted and counts were stable during hospitalization- no abnormal bruising or bleeding  Peripheral vascular disease- from hospital discharge "Does not appear to be an active issue currently. ABIs were done which showed evidence for severe left lower extremity arterial disease. No acute ischemia present at this time.   Old records were reviewed.  It appears that patient underwent an attempt at revascularization in 2018 which was not successful.  This was done by Dr. Fletcher Anon.  Does not appear that patient has followed up with Dr. Fletcher Anon since then" apparently his cousin was taking care of him then and she is currently not. Now his niece is taking care of him and was unaware to follow up.   ETOH abuse- having 1-2 beers daily.   Low TSH- recommended follow up in a few weeks.   Anemia- stable during hospitalization.   CHF- euvolemic. Echocardiogram from 2018 showed normal systolic function. Grade 2 diastolic dysfunction was noted.  Review of Systems:  Review of Systems  Unable to perform ROS: Dementia    Past Medical History:  Diagnosis Date  . Alcohol abuse   . Anemia   . Aneurysm ([place])   . CKD (chronic kidney disease), stage III ([place])   . COPD (chronic obstructive pulmonary disease) ([place])   . Dysrhythmia   . Fever of unknown origin 08/2016  . History of atrial fibrillation   . Hyperchloremia   . Hypercholesteremia   . Hyperpotassemia   . Hypertension   . Hypertensive renal disease, benign   . Leg edema   . PAD (peripheral artery disease) ([place])   . Pneumonia   .  Pressure ulcer of left heel   . Seizures ([place])    has not had a seizure in 15 yrs  . Tobacco use   . Weight loss    Past Surgical History:  Procedure Laterality Date  . ABDOMINAL AORTOGRAM W/LOWER EXTREMITY N/A 08/13/2016   Procedure: Abdominal Aortogram w/Lower Extremity;  Surgeon: Wellington Hampshire, MD;  Location: [facility];  Service: Cardiovascular;  Laterality: N/A;    . 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, 1996  . EYE SURGERY     December 2017  . PERIPHERAL VASCULAR BALLOON ANGIOPLASTY  08/13/2016   Procedure: Peripheral Vascular Balloon Angioplasty;  Surgeon: Wellington Hampshire, MD;  Location: [facility];  Service: Cardiovascular;;  Aborted   Social History:   reports that he has been smoking cigarettes. He has a 14.00 pack-year smoking history. He has never used smokeless tobacco. He reports current alcohol use. He reports that he does not use drugs.  Family History  Problem Relation Age of Onset  . Cancer Mother   . Congestive Heart Failure Father   . Alzheimer's disease Sister   . Congestive Heart Failure Sister   . Clotting disorder Sister   . Congestive Heart Failure Brother   . Clotting disorder Sister     Medications: Patient's Medications  New Prescriptions   No medications on file  Previous Medications   ALBUTEROL (PROVENTIL HFA;VENTOLIN HFA) 108 (90 BASE) MCG/ACT INHALER    Inhale 2 puffs into the lungs every 6 (six) hours as needed for wheezing or shortness of breath.   AMLODIPINE (NORVASC) 10 MG TABLET    Take 1 tablet (10 mg total) by mouth daily.   ASPIRIN EC 81 MG TABLET    Take 1 tablet (81 mg total) by mouth daily.   ATORVASTATIN (LIPITOR) 20 MG TABLET    TAKE 1 TABLET BY MOUTH EVERY DAY   CARBOXYMETHYLCELLULOSE SODIUM (EYE DROPS OP)    Apply 1 drop to eye daily as needed (dry eyes).   CARVEDILOL (COREG) 3.125 MG TABLET    Take 1 tablet (3.125 mg total) by mouth 2 (two) times daily with a meal.   CYANOCOBALAMIN (VITAMIN B 12 PO)    Take 1 tablet by mouth daily.   DIVALPROEX (DEPAKOTE) 250 MG DR TABLET    TAKE ONE TABLET BY MOUTH THREE TIMES DAILY FOR SEIZURES   DOXYCYCLINE (VIBRA-TABS) 100 MG TABLET    Take 1 tablet (100 mg total) by mouth every 12 (twelve) hours for 2 days.   GUAIFENESIN (MUCINEX) 600 MG 12 HR TABLET    Take 1 tablet (600 mg total) by mouth 2 (two) times daily.   GUAIFENESIN (ROBITUSSIN) 100 MG/5ML SOLN     Take 5 mLs (100 mg total) by mouth every 4 (four) hours as needed for cough or to loosen phlegm.   HYDRALAZINE (APRESOLINE) 50 MG TABLET    Take 1 tablet (50 mg total) by mouth 3 (three) times daily.   LEVETIRACETAM (KEPPRA) 500 MG TABLET    TAKE 1 TABLET BY MOUTH TWICE A DAY   MEMANTINE (NAMENDA) 10 MG TABLET    TAKE 1 TABLET BY MOUTH TWICE A DAY   MULTIPLE VITAMINS-MINERALS (THEREMS-M) TABS    TAKE 1 TABLET BY MOUTH EVERY DAY   PREDNISONE (DELTASONE) 20 MG TABLET    Take 1 tablet twice daily for 3 days followed by 1 tablet once daily for 3 days and then stop   THIAMINE ([employer] B-1) 100 MG TABLET  Take 1 tablet (100 mg total) by mouth daily.  Modified Medications   Modified Medication Previous Medication   TDAP (BOOSTRIX) 5-2.5-18.5 LF-MCG/0.5 INJECTION Tdap (BOOSTRIX) 5-2.5-18.5 LF-MCG/0.5 injection      Inject 0.5 mLs into the muscle once for 1 dose.    Inject 0.5 mLs into the muscle once.   ZOSTER VACCINE ADJUVANTED [facility]) INJECTION Zoster Vaccine Adjuvanted [facility]) injection      Inject 0.5 mLs into the muscle once for 1 dose.    Inject 0.5 mLs into the muscle once.  Discontinued Medications   No medications on file     Physical Exam:  Vitals:   05/04/18 1030  BP: 134/70  Pulse: 70  Temp: (!) 97.4 F (36.3 C)  TempSrc: Oral  SpO2: 90%  Weight: 128 lb 3.2 oz (58.2 kg)  Height: 5\' 8"  (1.727 m)   Body mass index is 19.49 kg/m.  Physical Exam Constitutional:      General: He is not in acute distress. HENT:     Right Ear: External ear normal.     Left Ear: External ear normal.     Nose: Nose normal.     Mouth/Throat:     Mouth: Mucous membranes are dry.  Eyes:     Pupils: Pupils are equal, round, and reactive to light.  Neck:     Musculoskeletal: Normal range of motion.  Cardiovascular:     Rate and Rhythm: Normal rate and regular rhythm.  Pulmonary:     Effort: Pulmonary effort is normal. No respiratory distress.  Abdominal:     General: Bowel sounds are  normal.     Palpations: Abdomen is soft.  Skin:    General: Skin is warm and dry.     Capillary Refill: Capillary refill takes less than 2 seconds.  Neurological:     Mental Status: He is alert and oriented to person, place, and time.  Psychiatric:        Mood and Affect: Mood normal.    Labs reviewed: Basic Metabolic Panel: Recent Labs    04/29/18 0342  04/30/18 0530  04/30/18 1327 05/01/18 0512 05/02/18 0453  NA 142   < > 142  --   --  144 146*  K 5.4*   < > 6.3*   < > 4.0 4.2 3.9  CL 110   < > 114*  --   --  114* 116*  CO2 23   < > 25  --   --  24 22  GLUCOSE 135*   < > 118*  --   --  105* 82  BUN 30*   < > 35*  --   --  35* 29*  CREATININE 1.48*   < > 1.25*  --   --  1.11 1.02  CALCIUM 8.1*   < > 8.3*  --   --  8.2* 8.3*  MG 2.1  --   --   --   --   --   --   PHOS 4.9*  --   --   --   --   --   --   TSH 0.260*  --   --   --   --   --   --    < > = values in this interval not displayed.   Liver Function Tests: Recent Labs    06/09/17 1033 04/28/18 1549 04/29/18 0342  AST 19 32 32  ALT 9 18 18   ALKPHOS  --  72 61  BILITOT 0.3 0.4 0.8  PROT 6.9 8.1 6.9  ALBUMIN  --  4.2 3.4*   No results for input(s): LIPASE, AMYLASE in the last 8760 hours. No results for input(s): AMMONIA in the last 8760 hours. CBC: Recent Labs    06/09/17 1033 04/28/18 1549  04/30/18 0249 05/01/18 0512 05/01/18 1255  WBC 5.9 5.4   < > 6.7 8.6 8.9  NEUTROABS 1,422* 3.1  --   --   --   --   HGB 12.2* 13.0   < > 11.2* 10.7* 11.6*  HCT 37.0* 42.9   < > 37.6* 34.8* 37.4*  MCV 89.2 102.6*   < > 102.7* 100.3* 101.1*  PLT 170 100*   < > 77* 84* 92*   < > = values in this interval not displayed.   Lipid Panel: Recent Labs    06/09/17 1033  CHOL 219*  HDL 69  LDLCALC 135*  TRIG 63  CHOLHDL 3.2   TSH: Recent Labs    04/29/18 0342  TSH 0.260*   A1C: Lab Results  Component Value Date   HGBA1C 5.2 04/15/2016     Assessment/Plan 1. Low TSH level - TSH; Future  2.  Dementia with behavioral disturbance, unspecified dementia type ([place]) -progressive decline. Weight loss noted which is expected to worsen as disease progresses. No worsening of behaviors.   3. Essential hypertension, benign Controlled will continue norvasc and hydralazine 50 mg TID. Will continue current regimen.   4. CKD (chronic kidney disease), stage III (HCC) - COMPLETE METABOLIC PANEL WITH GFR; Future  5. COPD, group B, by GOLD 2017 classification ([place]) -improvement in recent exacerbation. Continues on prednisone, doxycyline and albuterol. has restarted smoking, encouraged cessation. Had followed with pulmonary in the past due to recent increase in exacerbations recommended follow up.   6. PAD (peripheral artery disease) ([place]) Has been following with cardiology but when niece took over care she did not know he was supposed to follow up.   7. Paroxysmal atrial fibrillation (HCC) Rate controlled. Not a candidate of anticoagulation due to falls and ETOH abuse  8. Loss of weight -ongoing, poor diet, niece planning on getting supplement, she has liberalized diet.  - TSH; Future  9. Thrombocytopenia ([place]) Will follow up lab, also encouraged to follow with hematology appears he has missed appts there as well. - CBC with Differential/Platelets; Future  Next appt: 3 months, sooner if needed Jimie Kuwahara K. [place], [facility] [phone]

## 2018-05-05 DIAGNOSIS — F17210 Nicotine dependence, cigarettes, uncomplicated: Secondary | ICD-10-CM

## 2018-05-05 DIAGNOSIS — F0150 Vascular dementia without behavioral disturbance: Secondary | ICD-10-CM

## 2018-05-05 DIAGNOSIS — J441 Chronic obstructive pulmonary disease with (acute) exacerbation: Secondary | ICD-10-CM

## 2018-05-05 DIAGNOSIS — I69018 Other symptoms and signs involving cognitive functions following nontraumatic subarachnoid hemorrhage: Secondary | ICD-10-CM

## 2018-05-25 ENCOUNTER — Other Ambulatory Visit: Payer: Medicare Other

## 2018-05-25 DIAGNOSIS — N183 Chronic kidney disease, stage 3 unspecified: Secondary | ICD-10-CM

## 2018-05-25 DIAGNOSIS — R634 Abnormal weight loss: Secondary | ICD-10-CM

## 2018-05-25 DIAGNOSIS — D696 Thrombocytopenia, unspecified: Secondary | ICD-10-CM

## 2018-05-25 DIAGNOSIS — R7989 Other specified abnormal findings of blood chemistry: Secondary | ICD-10-CM

## 2018-05-25 LAB — CBC WITH DIFFERENTIAL/PLATELET
Absolute Monocytes: 963 cells/uL — ABNORMAL HIGH (ref 200–950)
Basophils Absolute: 18 cells/uL (ref 0–200)
Basophils Relative: 0.2 %
Eosinophils Absolute: 144 cells/uL (ref 15–500)
Eosinophils Relative: 1.6 %
HCT: 33.1 % — ABNORMAL LOW (ref 38.5–50.0)
Hemoglobin: 11 g/dL — ABNORMAL LOW (ref 13.2–17.1)
Lymphs Abs: 1575 cells/uL (ref 850–3900)
MCH: 31.4 pg (ref 27.0–33.0)
MCHC: 33.2 g/dL (ref 32.0–36.0)
MCV: 94.6 fL (ref 80.0–100.0)
MPV: 11.6 fL (ref 7.5–12.5)
Monocytes Relative: 10.7 %
Neutro Abs: 6300 cells/uL (ref 1500–7800)
Neutrophils Relative %: 70 %
Platelets: 136 10*3/uL — ABNORMAL LOW (ref 140–400)
RBC: 3.5 10*6/uL — ABNORMAL LOW (ref 4.20–5.80)
RDW: 14 % (ref 11.0–15.0)
TOTAL LYMPHOCYTE: 17.5 %
WBC: 9 10*3/uL (ref 3.8–10.8)

## 2018-05-25 LAB — COMPLETE METABOLIC PANEL WITH GFR
AG Ratio: 1.1 (calc) (ref 1.0–2.5)
ALT: 6 U/L — ABNORMAL LOW (ref 9–46)
AST: 14 U/L (ref 10–35)
Albumin: 3.5 g/dL — ABNORMAL LOW (ref 3.6–5.1)
Alkaline phosphatase (APISO): 71 U/L (ref 35–144)
BILIRUBIN TOTAL: 0.3 mg/dL (ref 0.2–1.2)
BUN/Creatinine Ratio: 11 (calc) (ref 6–22)
BUN: 14 mg/dL (ref 7–25)
CO2: 27 mmol/L (ref 20–32)
Calcium: 9.1 mg/dL (ref 8.6–10.3)
Chloride: 107 mmol/L (ref 98–110)
Creat: 1.26 mg/dL — ABNORMAL HIGH (ref 0.70–1.18)
GFR, EST AFRICAN AMERICAN: 62 mL/min/{1.73_m2} (ref >=60)
GFR, Est Non African American: 54 mL/min/{1.73_m2} — ABNORMAL LOW (ref >=60)
Globulin: 3.1 g/dL (calc) (ref 1.9–3.7)
Glucose, Bld: 89 mg/dL (ref 65–99)
Potassium: 4.6 mmol/L (ref 3.5–5.3)
Sodium: 142 mmol/L (ref 135–146)
Total Protein: 6.6 g/dL (ref 6.1–8.1)

## 2018-05-25 LAB — TSH: TSH: 1.88 mIU/L (ref 0.40–4.50)

## 2018-06-01 ENCOUNTER — Ambulatory Visit: Payer: Medicare Other | Admitting: Cardiovascular Disease

## 2018-06-01 ENCOUNTER — Encounter: Payer: Self-pay | Admitting: Cardiovascular Disease

## 2018-06-01 VITALS — BP 137/71 | HR 65 | Ht 68.0 in | Wt 127.8 lb

## 2018-06-01 DIAGNOSIS — Z720 Tobacco use: Secondary | ICD-10-CM

## 2018-06-01 DIAGNOSIS — I739 Peripheral vascular disease, unspecified: Secondary | ICD-10-CM | POA: Diagnosis not present

## 2018-06-01 DIAGNOSIS — I10 Essential (primary) hypertension: Secondary | ICD-10-CM

## 2018-06-01 DIAGNOSIS — E785 Hyperlipidemia, unspecified: Secondary | ICD-10-CM

## 2018-06-01 DIAGNOSIS — I480 Paroxysmal atrial fibrillation: Secondary | ICD-10-CM

## 2018-06-01 NOTE — Progress Notes (Signed)
Cardiology Office Note   Date:  06/01/2018   ID:  [name], DOB 09-Nov-1940, MRN [id number]  PCP:  [name], NP  Cardiologist: Dr. Radford Pax  Chief Complaint  Patient presents with  . Follow-up    pt denied chest pain      History of Present Illness: [name] is a 78 y.o. male who Is here today for a follow-up visit regarding peripheral arterial disease. 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 He was not felt to be a good candidate for anticoagulation given history of heavy alcohol use. Echocardiogram in 2018 showed normal LV systolic function. He had a rash with diltiazem. He was seen in 2018 for a pressure ulcer on the left heel.  He underwent noninvasive vascular evaluation which showed an ABI of 0.52 on the left with significant left common and external iliac artery stenosis and an occluded left mid SFA. Angiography in May, 2018 showed moderate heavily calcified iliac disease bilaterally with short occlusion of the left distal SFA with three-vessel runoff below the knee. Attempted angioplasty of the left SFA was not successful due to inability to cross the occlusion. He was scheduled for a staged procedure via the retrograde anterior tibial artery access. However, he was hospitalized for pneumonia. 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 Left heel ulceration healed without revascularization at that time.  He has been doing reasonably well and denies chest pain or shortness of breath.  He had lower extremity arterial Doppler done last month which showed an ABI of 0.62 on the right and 0.46 on the left.  In spite of this, the patient denies any claudication, rest pain or lower extremity ulceration.  He does have some dementia and he  seems to underestimate his symptoms.  He continues to drink beer.  He also resumed smoking.  He has not been steady on his feet and he continues to have issues with anemia and thrombocytopenia.  Past Medical History:  Diagnosis Date  . Alcohol abuse   . Anemia   . Aneurysm ([place])   . CKD (chronic kidney disease), stage III ([place])   . COPD (chronic obstructive pulmonary disease) ([place])   . Dysrhythmia   . Fever of unknown origin 08/2016  . History of atrial fibrillation   . Hyperchloremia   . Hypercholesteremia   . Hyperpotassemia   . Hypertension   . Hypertensive renal disease, benign   . Leg edema   . PAD (peripheral artery disease) ([place])   . Pneumonia   . Pressure ulcer of left heel   . Seizures ([place])    has not had a seizure in 15 yrs  . Tobacco use   . Weight loss     Past Surgical History:  Procedure Laterality Date  . ABDOMINAL AORTOGRAM W/LOWER EXTREMITY N/A 08/13/2016   Procedure: Abdominal Aortogram w/Lower Extremity;  Surgeon: Wellington Hampshire, MD;  Location: [facility];  Service: Cardiovascular;  Laterality: N/A;  . 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, 1996  . EYE SURGERY     December 2017  . PERIPHERAL VASCULAR BALLOON ANGIOPLASTY  08/13/2016   Procedure: Peripheral Vascular Balloon Angioplasty;  Surgeon: Wellington Hampshire, MD;  Location: [facility];  Service: Cardiovascular;;  Aborted     Current Outpatient Medications  Medication Sig  Dispense Refill  . albuterol (PROVENTIL HFA;VENTOLIN HFA) 108 (90 Base) MCG/ACT inhaler Inhale 2 puffs into the lungs every 6 (six) hours as needed for wheezing or shortness of breath. 1 Inhaler 2  . amLODipine (NORVASC) 10 MG tablet Take 1 tablet (10 mg total) by mouth daily. 30 tablet 0  . aspirin EC 81 MG tablet Take 1 tablet (81 mg total) by mouth daily. 90 tablet 3  . atorvastatin (LIPITOR) 20 MG tablet TAKE 1 TABLET BY MOUTH EVERY DAY 90 tablet 1  . Carboxymethylcellulose Sodium (EYE DROPS OP) Apply 1 drop to eye  daily as needed (dry eyes).    . carvedilol (COREG) 3.125 MG tablet Take 1 tablet (3.125 mg total) by mouth 2 (two) times daily with a meal. 180 tablet 3  . Cyanocobalamin (VITAMIN B 12 PO) Take 1 tablet by mouth daily.    . divalproex (DEPAKOTE) 250 MG DR tablet TAKE ONE TABLET BY MOUTH THREE TIMES DAILY FOR SEIZURES 270 tablet 1  . guaiFENesin (MUCINEX) 600 MG 12 hr tablet Take 1 tablet (600 mg total) by mouth 2 (two) times daily. 30 tablet 0  . guaiFENesin (ROBITUSSIN) 100 MG/5ML SOLN Take 5 mLs (100 mg total) by mouth every 4 (four) hours as needed for cough or to loosen phlegm. 236 mL 0  . hydrALAZINE (APRESOLINE) 50 MG tablet Take 1 tablet (50 mg total) by mouth 3 (three) times daily. 90 tablet 0  . levETIRAcetam (KEPPRA) 500 MG tablet TAKE 1 TABLET BY MOUTH TWICE A DAY 180 tablet 1  . memantine (NAMENDA) 10 MG tablet TAKE 1 TABLET BY MOUTH TWICE A DAY 180 tablet 3  . Multiple Vitamins-Minerals (THEREMS-M) TABS TAKE 1 TABLET BY MOUTH EVERY DAY 90 tablet 3  . predniSONE (DELTASONE) 20 MG tablet Take 1 tablet twice daily for 3 days followed by 1 tablet once daily for 3 days and then stop 9 tablet 0  . thiamine (CVS B-1) 100 MG tablet Take 1 tablet (100 mg total) by mouth daily. 30 tablet 3   No current facility-administered medications for this visit.     Allergies:   Diltiazem and Penicillins    Social History:  The patient  reports that he has been smoking cigarettes. He has a 14.00 pack-year smoking history. He has never used smokeless tobacco. He reports current alcohol use. He reports that he does not use drugs.   Family History:  The patient's family history includes Alzheimer's disease in his sister; Cancer in his mother; Clotting disorder in his sister and sister; Congestive Heart Failure in his brother, father, and sister.    ROS:  Please see the history of present illness.   Otherwise, review of systems are positive for none.   All other systems are reviewed and negative.     PHYSICAL EXAM: VS:  BP 137/71   Pulse 65   Ht '5\' 8"'  (1.610 m)   Wt 127 lb 12.8 oz (58 kg)   BMI 19.43 kg/m  , BMI Body mass index is 19.43 kg/m. GEN: Well nourished, well developed, in no acute distress  HEENT: normal  Neck: no JVD, carotid bruits, or masses Cardiac: RRR; no ubs, or gall+2 edema . 2/6 systolic ejection murmur at the aortic area  Respiratory:  clear to auscultation bilaterally, normal work of breathing GI: soft, nontender, nondistended, + BS MS: no deformity or atrophy  Skin: warm and dry, no rash Neuro:  Strength and sensation are intact Psych: euthymic mood, full affect Vascular: Femoral pulses +1  bilaterally with bruits.  Dorsalis pedis is palpable on the right side but not the left.  EKG:  EKG is not ordered today.    Recent Labs: 04/29/2018: Magnesium 2.1 05/25/2018: ALT 6; BUN 14; Creat 1.26; Hemoglobin 11.0; Platelets 136; Potassium 4.6; Sodium 142; TSH 1.88    Lipid Panel    Component Value Date/Time   CHOL 219 (H) 06/09/2017 1033   CHOL 206 (H) 07/13/2014 1130   TRIG 63 06/09/2017 1033   HDL 69 06/09/2017 1033   HDL 112 07/13/2014 1130   CHOLHDL 3.2 06/09/2017 1033   VLDL 10 04/15/2016 0211   LDLCALC 135 (H) 06/09/2017 1033      Wt Readings from Last 3 Encounters:  06/01/18 127 lb 12.8 oz (58 kg)  05/04/18 128 lb 3.2 oz (58.2 kg)  04/30/18 122 lb 12.7 oz (55.7 kg)       No flowsheet data found.    ASSESSMENT AND PLAN:  1.  Peripheral arterial disease: The patient has evidence of severe peripheral arterial disease based on his ABI but at the present time he denies any pain and he has no ulceration.  If there is any worsening in symptoms, he will require repeat angiography with attempted revascularization on the left side via  retrograde access.  2. Hyperlipidemia: Continue treatment with atorvastatin.  3.  Tobacco use: I discussed with him the importance of smoking cessation.  4. Paroxysmal atrial fibrillation: Currently in  regular rhythm.  He continues to be a poor candidate for anticoagulation due to continued alcohol use, thrombocytopenia and unsteady gait.  5.  Essential hypertension: Blood pressure is controlled on current medications.    Disposition:   FU with me in 6 months   Signed,  Kathlyn Sacramento, MD  06/01/2018 9:32 AM    [place]

## 2018-06-01 NOTE — Patient Instructions (Signed)
Medication Instructions:  No changes If you need a refill on your cardiac medications before your next appointment, please call your pharmacy.   Lab work: None ordered  Testing/Procedures: None ordered  Follow-Up: At [facility], you and your health needs are our priority.  As part of our continuing mission to provide you with exceptional heart care, we have created designated Provider Care Teams.  These Care Teams include your primary Cardiologist (physician) and Advanced Practice Providers (APPs -  Physician Assistants and Nurse Practitioners) who all work together to provide you with the care you need, when you need it. You will need a follow up appointment in 6 months.  Please call our office 2 months in advance to schedule this appointment.  You may see Dr. Fletcher Anon or one of the following Advanced Practice Providers on your designated Care Team:  Kerin Ransom, [name] Roby Lofts, [place] . Sande Rives, [name]  Any Other Special Instructions Will Be Listed Below (If Applicable).  Steps to Quit Smoking  Smoking tobacco can be bad for your health. It can also affect almost every organ in your body. Smoking puts you and people around you at risk for many serious long-lasting (chronic) diseases. Quitting smoking is hard, but it is one of the best things that you can do for your health. It is never too late to quit. What are the benefits of quitting smoking? When you quit smoking, you lower your risk for getting serious diseases and conditions. They can include:  Lung cancer or lung disease.  Heart disease.  Stroke.  Heart attack.  Not being able to have children (infertility).  Weak bones (osteoporosis) and broken bones (fractures). If you have coughing, wheezing, and shortness of breath, those symptoms may get better when you quit. You may also get sick less often. If you are pregnant, quitting smoking can help to lower your chances of having a baby of low birth weight. What can  I do to help me quit smoking? Talk with your doctor about what can help you quit smoking. Some things you can do (strategies) include:  Quitting smoking totally, instead of slowly cutting back how much you smoke over a period of time.  Going to in-person counseling. You are more likely to quit if you go to many counseling sessions.  Using resources and support systems, such as: ? Database administrator with a Social worker. ? Phone quitlines. ? Careers information officer. ? Support groups or group counseling. ? Text messaging programs. ? Mobile phone apps or applications.  Taking medicines. Some of these medicines may have nicotine in them. If you are pregnant or breastfeeding, do not take any medicines to quit smoking unless your doctor says it is okay. Talk with your doctor about counseling or other things that can help you. Talk with your doctor about using more than one strategy at the same time, such as taking medicines while you are also going to in-person counseling. This can help make quitting easier. What things can I do to make it easier to quit? Quitting smoking might feel very hard at first, but there is a lot that you can do to make it easier. Take these steps:  Talk to your family and friends. Ask them to support and encourage you.  Call phone quitlines, reach out to support groups, or work with a Social worker.  Ask people who smoke to not smoke around you.  Avoid places that make you want (trigger) to smoke, such as: ? Bars. ? Parties. ? Smoke-break  areas at work.  Spend time with people who do not smoke.  Lower the stress in your life. Stress can make you want to smoke. Try these things to help your stress: ? Getting regular exercise. ? Deep-breathing exercises. ? Yoga. ? Meditating. ? Doing a body scan. To do this, close your eyes, focus on one area of your body at a time from head to toe, and notice which parts of your body are tense. Try to relax the muscles in those  areas.  Download or buy apps on your mobile phone or tablet that can help you stick to your quit plan. There are many free apps, such as QuitGuide from the [employer] Office manager for Disease Control and Prevention). You can find more support from smokefree.gov and other websites. This information is not intended to replace advice given to you by your health care provider. Make sure you discuss any questions you have with your health care provider. Document Released: 01/18/2009 Document Revised: 11/20/2015 Document Reviewed: 08/08/2014 Elsevier Interactive Patient Education  2019 [employer].

## 2018-06-02 ENCOUNTER — Other Ambulatory Visit: Payer: Self-pay | Admitting: *Deleted

## 2018-06-02 MED ORDER — DIVALPROEX SODIUM 250 MG PO DR TAB: DELAYED_RELEASE_TABLET | ORAL | 1 refills | Status: DC

## 2018-06-02 NOTE — Telephone Encounter (Signed)
[place]

## 2018-06-11 ENCOUNTER — Ambulatory Visit: Payer: Self-pay

## 2018-06-11 ENCOUNTER — Encounter: Payer: Self-pay | Admitting: Family

## 2018-06-21 ENCOUNTER — Other Ambulatory Visit: Payer: Self-pay | Admitting: Nurse Practitioner

## 2018-07-09 ENCOUNTER — Other Ambulatory Visit: Payer: Self-pay | Admitting: Nurse Practitioner

## 2018-07-09 DIAGNOSIS — I480 Paroxysmal atrial fibrillation: Secondary | ICD-10-CM

## 2018-07-12 ENCOUNTER — Other Ambulatory Visit: Payer: Self-pay | Admitting: Nurse Practitioner

## 2018-08-03 ENCOUNTER — Encounter: Payer: Self-pay | Admitting: Nurse Practitioner

## 2018-08-03 ENCOUNTER — Ambulatory Visit (INDEPENDENT_AMBULATORY_CARE_PROVIDER_SITE_OTHER): Payer: Medicare Other | Admitting: Nurse Practitioner

## 2018-08-03 ENCOUNTER — Other Ambulatory Visit: Payer: Self-pay

## 2018-08-03 DIAGNOSIS — F0391 Unspecified dementia with behavioral disturbance: Secondary | ICD-10-CM | POA: Diagnosis not present

## 2018-08-03 DIAGNOSIS — J449 Chronic obstructive pulmonary disease, unspecified: Secondary | ICD-10-CM

## 2018-08-03 DIAGNOSIS — I5032 Chronic diastolic (congestive) heart failure: Secondary | ICD-10-CM

## 2018-08-03 DIAGNOSIS — R569 Unspecified convulsions: Secondary | ICD-10-CM

## 2018-08-03 DIAGNOSIS — I10 Essential (primary) hypertension: Secondary | ICD-10-CM

## 2018-08-03 DIAGNOSIS — F1010 Alcohol abuse, uncomplicated: Secondary | ICD-10-CM

## 2018-08-03 DIAGNOSIS — I739 Peripheral vascular disease, unspecified: Secondary | ICD-10-CM

## 2018-08-03 DIAGNOSIS — E785 Hyperlipidemia, unspecified: Secondary | ICD-10-CM

## 2018-08-03 MED ORDER — TETANUS-DIPHTH-ACELL PERTUSSIS 5-2.5-18.5 LF-MCG/0.5 IM SUSP: 0.5000 mL | Freq: Once | INTRAMUSCULAR | 0 refills | Status: AC

## 2018-08-03 NOTE — Patient Instructions (Signed)
2 month follow up for routine visit- please call the office to schedule this appt if you have not already done so.

## 2018-08-03 NOTE — Progress Notes (Signed)
This service is provided via telemedicine  No vital signs collected/recorded due to the encounter was a telemedicine visit.   Location of patient (ex: home, work):  Home  Patient consents to a telephone visit:  Yes  Location of the provider (ex: office, home):  [employer], office  Names of all persons participating in the telemedicine service and their role in the encounter:  Leafy Half CMA, [name] NP, and patient.   Time spent on call:  8 mins  Virtual Visit via Telephone Note  I connected with [name] on 08/03/18 at 10:00 AM EDT by telephone and verified that I am speaking with the correct person using two identifiers.   I discussed the limitations, risks, security and privacy concerns of performing an evaluation and management service by telephone and the availability of in person appointments. I also discussed with the patient that there may be a patient responsible charge related to this service. The patient expressed understanding and agreed to proceed.    Careteam: Patient Care Team: [name], NP as PCP - General (Nurse Practitioner)  Advanced Directive information    Allergies  Allergen Reactions  . Diltiazem Rash and Other (See Comments)    Blisters  . Penicillins Other (See Comments)    Unknown Childhood allergy  Has patient had a PCN reaction causing immediate rash, facial/tongue/throat swelling, SOB or lightheadedness with hypotension: unknown Has patient had a PCN reaction causing severe rash involving mucus membranes or skin necrosis: unknown Has patient had a PCN reaction that required hospitalization unknown Has patient had a PCN reaction occurring within the last 10 years:no If all of the above answers are "NO", then may proceed with Cephalosporin use.     Chief Complaint  Patient presents with  . Medical Management of Chronic Issues    3 month follow up. Patient states his niece prepares his meds and isn't able  to confirm specifically but believes to be on all of current medication list. Niece is not available at his home currently. He says he has not got his tetnus recently. He consents to tetnus from pharmacy,     HPI: Patient is a 78 y.o. male for routine follow up Pt was hospitalized in January for COPD exacerbation. He was seen for hospital follow up and was doing well at that time. Denies any increase in shortness of breath, cough or congestion at this time. Recommended to follow up with pulmonary but has not do so at this time.   PAD- pt did follow up with cardiologist since last [place]. He denies any pain in LE or skin breakdown. He has severe disease and if any symptoms occur will need repeat angiography with attempted revascularization on the left side via retrograde access. Continues on ASA and statin at this time  Hyperlipidemia- continues on atorvastatin.   Dementia- stable, without significant changes to memory noted   CHF- appears euvolemic at this time; continues on coreg BID  HTN- states he does not take blood pressure at home.   Hx of seizures- without recent seizures, has been stable on Keppra   Review of Systems: pt a poor historian but ROS negative at this time. Review of Systems  Constitutional: Negative for chills, fever and weight loss.  HENT: Negative for tinnitus.   Respiratory: Negative for cough, sputum production and shortness of breath.   Cardiovascular: Negative for chest pain, palpitations and leg swelling.  Gastrointestinal: Negative for abdominal pain, constipation, diarrhea and heartburn.  Genitourinary: Negative  for dysuria, frequency and urgency.  Musculoskeletal: Negative for back pain, falls, joint pain and myalgias.  Skin: Negative.   Neurological: Negative for dizziness and headaches.  Psychiatric/Behavioral: Positive for memory loss. Negative for depression. The patient does not have insomnia.     Past Medical History:  Diagnosis Date  . Alcohol  abuse   . Anemia   . Aneurysm ([place])   . CKD (chronic kidney disease), stage III ([place])   . COPD (chronic obstructive pulmonary disease) ([place])   . Dysrhythmia   . Fever of unknown origin 08/2016  . History of atrial fibrillation   . Hyperchloremia   . Hypercholesteremia   . Hyperpotassemia   . Hypertension   . Hypertensive renal disease, benign   . Leg edema   . PAD (peripheral artery disease) ([place])   . Pneumonia   . Pressure ulcer of left heel   . Seizures ([place])    has not had a seizure in 15 yrs  . Tobacco use   . Weight loss    Past Surgical History:  Procedure Laterality Date  . ABDOMINAL AORTOGRAM W/LOWER EXTREMITY N/A 08/13/2016   Procedure: Abdominal Aortogram w/Lower Extremity;  Surgeon: Wellington Hampshire, MD;  Location: [facility];  Service: Cardiovascular;  Laterality: N/A;  . 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, 1996  . EYE SURGERY     December 2017  . PERIPHERAL VASCULAR BALLOON ANGIOPLASTY  08/13/2016   Procedure: Peripheral Vascular Balloon Angioplasty;  Surgeon: Wellington Hampshire, MD;  Location: [facility];  Service: Cardiovascular;;  Aborted   Social History:   reports that he has been smoking cigarettes. He has a 14.00 pack-year smoking history. He has never used smokeless tobacco. He reports current alcohol use. He reports that he does not use drugs.  Family History  Problem Relation Age of Onset  . Cancer Mother   . Congestive Heart Failure Father   . Alzheimer's disease Sister   . Congestive Heart Failure Sister   . Clotting disorder Sister   . Congestive Heart Failure Brother   . Clotting disorder Sister     Medications: Patient's Medications  New Prescriptions   No medications on file  Previous Medications   ALBUTEROL (PROVENTIL HFA;VENTOLIN HFA) 108 (90 BASE) MCG/ACT INHALER    Inhale 2 puffs into the lungs every 6 (six) hours as needed for wheezing or shortness of breath.   AMLODIPINE (NORVASC) 10 MG TABLET    Take 1 tablet (10 mg  total) by mouth daily.   ASPIRIN EC 81 MG TABLET    Take 1 tablet (81 mg total) by mouth daily.   ATORVASTATIN (LIPITOR) 20 MG TABLET    TAKE 1 TABLET BY MOUTH EVERY DAY   CARBOXYMETHYLCELLULOSE SODIUM (EYE DROPS OP)    Apply 1 drop to eye daily as needed (dry eyes).   CARVEDILOL (COREG) 3.125 MG TABLET    TAKE 1 TABLET (3.125 MG TOTAL) BY MOUTH 2 (TWO) TIMES DAILY WITH A MEAL.   CYANOCOBALAMIN (VITAMIN B 12 PO)    Take 1 tablet by mouth daily.   DIVALPROEX (DEPAKOTE) 250 MG DR TABLET    Take one tablet by mouth three times daily for seizures   GUAIFENESIN (MUCINEX) 600 MG 12 HR TABLET    Take 1 tablet (600 mg total) by mouth 2 (two) times daily.   GUAIFENESIN (ROBITUSSIN) 100 MG/5ML SOLN    Take 5 mLs (100 mg total) by mouth every 4 (four) hours as needed for cough  or to loosen phlegm.   HYDRALAZINE (APRESOLINE) 50 MG TABLET    Take 1 tablet (50 mg total) by mouth 3 (three) times daily.   LEVETIRACETAM (KEPPRA) 500 MG TABLET    TAKE 1 TABLET BY MOUTH TWICE A DAY   MEMANTINE (NAMENDA) 10 MG TABLET    TAKE 1 TABLET BY MOUTH TWICE A DAY   MULTIPLE VITAMINS-MINERALS (THEREMS-M) TABS    TAKE 1 TABLET BY MOUTH EVERY DAY   PREDNISONE (DELTASONE) 20 MG TABLET    Take 1 tablet twice daily for 3 days followed by 1 tablet once daily for 3 days and then stop   TDAP (BOOSTRIX) 5-2.5-18.5 LF-MCG/0.5 INJECTION    Inject 0.5 mLs into the muscle once.   THIAMINE (CVS B-1) 100 MG TABLET    Take 1 tablet (100 mg total) by mouth daily.  Modified Medications   No medications on file  Discontinued Medications   No medications on file     Physical Exam:    Labs reviewed: Basic Metabolic Panel: Recent Labs    04/29/18 0342  05/01/18 0512 05/02/18 0453 05/25/18 0946  NA 142   < > 144 146* 142  K 5.4*   < > 4.2 3.9 4.6  CL 110   < > 114* 116* 107  CO2 23   < > 24 22 27   GLUCOSE 135*   < > 105* 82 89  BUN 30*   < > 35* 29* 14  CREATININE 1.48*   < > 1.11 1.02 1.26*  CALCIUM 8.1*   < > 8.2* 8.3* 9.1   MG 2.1  --   --   --   --   PHOS 4.9*  --   --   --   --   TSH 0.260*  --   --   --  1.88   < > = values in this interval not displayed.   Liver Function Tests: Recent Labs    04/28/18 1549 04/29/18 0342 05/25/18 0946  AST 32 32 14  ALT 18 18 6*  ALKPHOS 72 61  --   BILITOT 0.4 0.8 0.3  PROT 8.1 6.9 6.6  ALBUMIN 4.2 3.4*  --    No results for input(s): LIPASE, AMYLASE in the last 8760 hours. No results for input(s): AMMONIA in the last 8760 hours. CBC: Recent Labs    04/28/18 1549  05/01/18 0512 05/01/18 1255 05/25/18 0946  WBC 5.4   < > 8.6 8.9 9.0  NEUTROABS 3.1  --   --   --  6,300  HGB 13.0   < > 10.7* 11.6* 11.0*  HCT 42.9   < > 34.8* 37.4* 33.1*  MCV 102.6*   < > 100.3* 101.1* 94.6  PLT 100*   < > 84* 92* 136*   < > = values in this interval not displayed.   Lipid Panel: No results for input(s): CHOL, HDL, LDLCALC, TRIG, CHOLHDL, LDLDIRECT in the last 8760 hours. TSH: Recent Labs    04/29/18 0342 05/25/18 0946  TSH 0.260* 1.88   A1C: Lab Results  Component Value Date   HGBA1C 5.2 04/15/2016     Assessment/Plan 1. COPD, group B, by GOLD 2017 classification ([place]) -stable, without worsening of symptoms.   2. Dementia with behavioral disturbance, unspecified dementia type ([place]) Ongoing, stable without worsening of cognitive status. Continues on namenda twice daily   3. Essential hypertension, benign -does not check blood pressure at home. Continues on coreg twice daily, hydralazine, coreg, norvasc daily  4. Chronic diastolic heart failure (H[facility]) Stable, continues on coreg twice daily   5. Alcohol abuse -hx of alcohol abuse, continues on thiamine and b12 supplement.   6. Seizures ([place]) No recent seizures, continues on depakote twice daily  7. Hyperlipidemia LDL goal <130 Continues on lipitor 20 mg daily, will need lipid panel at next follow up.  8. PVD/PAD Continues on asa 81 mg daily with statin. Without worsening of symptoms. Ongoing  follow up with cardiology  Next appt: 2 months for routine follow up.  Carlos American. Harle Battiest  [facility] [phone]   Follow Up Instructions:    I discussed the assessment and treatment plan with the patient. The patient was provided an opportunity to ask questions and all were answered. The patient agreed with the plan and demonstrated an understanding of the instructions.   The patient was advised to call back or seek an in-person evaluation if the symptoms worsen or if the condition fails to improve as anticipated.  I provided 22 minutes of non-face-to-face time during this encounter.  avs printed and mailed.  [name], NP

## 2018-08-04 ENCOUNTER — Other Ambulatory Visit: Payer: Self-pay | Admitting: Nurse Practitioner

## 2018-09-01 ENCOUNTER — Encounter: Payer: Self-pay | Admitting: Nurse Practitioner

## 2018-09-01 ENCOUNTER — Other Ambulatory Visit: Payer: Self-pay

## 2018-09-01 ENCOUNTER — Ambulatory Visit (INDEPENDENT_AMBULATORY_CARE_PROVIDER_SITE_OTHER): Payer: Medicare Other | Admitting: Nurse Practitioner

## 2018-09-01 DIAGNOSIS — Z0000 Encounter for general adult medical examination without abnormal findings: Secondary | ICD-10-CM | POA: Diagnosis not present

## 2018-09-01 MED ORDER — ZOSTER VAC RECOMB ADJUVANTED 50 MCG/0.5ML IM SUSR: 0.5000 mL | Freq: Once | INTRAMUSCULAR | 1 refills | Status: AC

## 2018-09-01 MED ORDER — TETANUS-DIPHTH-ACELL PERTUSSIS 5-2.5-18.5 LF-MCG/0.5 IM SUSP: 0.5000 mL | Freq: Once | INTRAMUSCULAR | 0 refills | Status: AC

## 2018-09-01 NOTE — Progress Notes (Signed)
   This service is provided via telemedicine  No vital signs collected/recorded due to the encounter was a telemedicine visit.   Location of patient (ex: home, work):  Home  Patient consents to a telephone visit:  Yes  Location of the provider (ex: office, home):  [facility], Office   Name of any referring provider: N/A  Names of all persons participating in the telemedicine service and their role in the encounter:  S.Chrae B/CMA, [name], NP, and Patient   Time spent on call: 10 min with medical assistant

## 2018-09-01 NOTE — Progress Notes (Signed)
Subjective:   [name] is a 78 y.o. male who presents for Medicare Annual/Subsequent preventive examination.  Review of Systems:   Cardiac Risk Factors include: advanced age (>53men, >11 women);male gender;sedentary lifestyle;smoking/ tobacco exposure;hypertension;dyslipidemia     Objective:    Vitals: There were no vitals taken for this visit.  There is no height or weight on file to calculate BMI.  Advanced Directives 09/01/2018 04/28/2018 04/28/2018 02/02/2018 06/09/2017 03/10/2017 02/02/2017  Does Patient Have a Medical Advance Directive? Yes Yes Yes No Yes No No  Type of Advance Directive Healthcare Power of Attorney Living will;Healthcare Power of [place];Living will - [place];Living will - -  Does patient want to make changes to medical advance directive? No - Patient declined No - Patient declined - - No - Patient declined - -  Copy of [place] in Chart? No - copy requested No - copy requested - - No - copy requested - -  Would patient like information on creating a medical advance directive? - - - - - - -    Tobacco Social History   Tobacco Use  Smoking Status Current Some Day Smoker  . Packs/day: 0.25  . Years: 56.00  . Pack years: 14.00  . Types: Cigarettes  Smokeless Tobacco Never Used  Tobacco Comment   Smokes occasionally      Ready to quit: Not Answered Counseling given: Not Answered Comment: Smokes occasionally    Clinical Intake:  Pre-visit preparation completed: Yes  Pain : No/denies pain     BMI - recorded: 19.43 Nutritional Status: BMI of 19-24  Normal Nutritional Risks: None Diabetes: No  How often do you need to have someone help you when you read instructions, pamphlets, or other written materials from your doctor or pharmacy?: 3 - Sometimes What is the last grade level you completed in school?: 12th grade  Interpreter Needed?: No     Past Medical History:   Diagnosis Date  . Alcohol abuse   . Anemia   . Aneurysm ([place])   . CKD (chronic kidney disease), stage III ([place])   . COPD (chronic obstructive pulmonary disease) ([place])   . Dysrhythmia   . Fever of unknown origin 08/2016  . History of atrial fibrillation   . Hyperchloremia   . Hypercholesteremia   . Hyperpotassemia   . Hypertension   . Hypertensive renal disease, benign   . Leg edema   . PAD (peripheral artery disease) ([place])   . Pneumonia   . Pressure ulcer of left heel   . Seizures ([place])    has not had a seizure in 15 yrs  . Tobacco use   . Weight loss    Past Surgical History:  Procedure Laterality Date  . ABDOMINAL AORTOGRAM W/LOWER EXTREMITY N/A 08/13/2016   Procedure: Abdominal Aortogram w/Lower Extremity;  Surgeon: Wellington Hampshire, MD;  Location: [facility];  Service: Cardiovascular;  Laterality: N/A;  . 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, 1996  . EYE SURGERY     December 2017  . PERIPHERAL VASCULAR BALLOON ANGIOPLASTY  08/13/2016   Procedure: Peripheral Vascular Balloon Angioplasty;  Surgeon: Wellington Hampshire, MD;  Location: [facility];  Service: Cardiovascular;;  Aborted   Family History  Problem Relation Age of Onset  . Cancer Mother   . Congestive Heart Failure Father   . Alzheimer's disease Sister   . Congestive Heart Failure Sister   . Clotting disorder Sister   .  Congestive Heart Failure Brother   . Clotting disorder Sister    Social History   Socioeconomic History  . Marital status: Single    Spouse name: Not on file  . Number of children: Not on file  . Years of education: Not on file  . Highest education level: Not on file  Occupational History  . Not on file  Social Needs  . Financial resource strain: Not hard at all  . Food insecurity:    Worry: Never true    Inability: Never true  . Transportation needs:    Medical: No    Non-medical: No  Tobacco Use  . Smoking status: Current Some Day Smoker    Packs/day: 0.25    Years:  56.00    Pack years: 14.00    Types: Cigarettes  . Smokeless tobacco: Never Used  . Tobacco comment: Smokes occasionally   Substance and Sexual Activity  . Alcohol use: Yes    Alcohol/week: 0.0 standard drinks    Comment: beer occasionally   . Drug use: No  . Sexual activity: Not Currently  Lifestyle  . Physical activity:    Days per week: 0 days    Minutes per session: 0 min  . Stress: Not at all  Relationships  . Social connections:    Talks on phone: More than three times a week    Gets together: More than three times a week    Attends religious service: Never    Active member of club or organization: No    Attends meetings of clubs or organizations: Never    Relationship status: Never married  Other Topics Concern  . Not on file  Social History Narrative  . Not on file    Outpatient Encounter Medications as of 09/01/2018  Medication Sig  . albuterol (PROVENTIL HFA;VENTOLIN HFA) 108 (90 Base) MCG/ACT inhaler Inhale 2 puffs into the lungs every 6 (six) hours as needed for wheezing or shortness of breath.  Marland Kitchen amLODipine (NORVASC) 10 MG tablet Take 1 tablet (10 mg total) by mouth daily.  Marland Kitchen aspirin EC 81 MG tablet Take 1 tablet (81 mg total) by mouth daily.  Marland Kitchen atorvastatin (LIPITOR) 20 MG tablet TAKE 1 TABLET BY MOUTH EVERY DAY  . Carboxymethylcellulose Sodium (EYE DROPS OP) Apply 1 drop to eye daily as needed (dry eyes).  . carvedilol (COREG) 3.125 MG tablet TAKE 1 TABLET (3.125 MG TOTAL) BY MOUTH 2 (TWO) TIMES DAILY WITH A MEAL.  Marland Kitchen Cyanocobalamin (VITAMIN B 12 PO) Take 1 tablet by mouth daily.  . divalproex (DEPAKOTE) 250 MG DR tablet Take one tablet by mouth three times daily for seizures  . guaiFENesin (MUCINEX) 600 MG 12 hr tablet Take 1 tablet (600 mg total) by mouth 2 (two) times daily.  Marland Kitchen guaiFENesin (ROBITUSSIN) 100 MG/5ML SOLN Take 5 mLs (100 mg total) by mouth every 4 (four) hours as needed for cough or to loosen phlegm.  . hydrALAZINE (APRESOLINE) 50 MG tablet Take  1 tablet (50 mg total) by mouth 3 (three) times daily.  Marland Kitchen levETIRAcetam (KEPPRA) 500 MG tablet TAKE 1 TABLET BY MOUTH TWICE A DAY  . memantine (NAMENDA) 10 MG tablet TAKE 1 TABLET BY MOUTH TWICE A DAY  . Multiple Vitamins-Minerals (THEREMS-M) TABS TAKE 1 TABLET BY MOUTH EVERY DAY  . Tdap (BOOSTRIX) 5-2.5-18.5 LF-MCG/0.5 injection Inject 0.5 mLs into the muscle once for 1 dose.  . thiamine (CVS B-1) 100 MG tablet Take 1 tablet (100 mg total) by mouth daily.  Marland Kitchen  Zoster Vaccine Adjuvanted [facility] injection Inject 0.5 mLs into the muscle once for 1 dose.  . [DISCONTINUED] Tdap (BOOSTRIX) 5-2.5-18.5 LF-MCG/0.5 injection Inject 0.5 mLs into the muscle once.  . [DISCONTINUED] Zoster Vaccine Adjuvanted [facility]) injection Inject 0.5 mLs into the muscle once.   No facility-administered encounter medications on file as of 09/01/2018.     Activities of Daily Living In your present state of health, do you have any difficulty performing the following activities: 09/01/2018 04/28/2018  Hearing? N N  Vision? N N  Difficulty concentrating or making decisions? Tempie Donning  Walking or climbing stairs? N Y  Dressing or bathing? N N  Doing errands, shopping? Y Y  Comment neice helps with appts -  Preparing Food and eating ? N -  Using the Toilet? N -  In the past six months, have you accidently leaked urine? N -  Do you have problems with loss of bowel control? N -  Managing your Medications? Y -  Managing your Finances? N -  Housekeeping or managing your Housekeeping? N -  Some recent data might be hidden    Patient Care Team: [name], NP as PCP - General (Nurse Practitioner)   Assessment:   This is a routine wellness examination for [name].  Exercise Activities and Dietary recommendations Current Exercise Habits: The patient does not participate in regular exercise at present  Goals    . DIET - INCREASE WATER INTAKE       Fall Risk Fall Risk  09/01/2018 08/03/2018 05/04/2018 02/02/2018  12/31/2017  Falls in the past year? 0 0 0 No No  Number falls in past yr: 0 0 0 - -  Comment - - - - -  Injury with Fall? 0 - 0 - -  Risk for fall due to : - - - - -   Is the patient's home free of loose throw rugs in walkways, pet beds, electrical cords, etc?   yes      Grab bars in the bathroom? yes      Handrails on the stairs?   yes      Adequate lighting?   yes  Timed Get Up and Go Performed: na  Depression Screen PHQ 2/9 Scores 09/01/2018 12/31/2017 06/09/2017 12/13/2015  PHQ - 2 Score 0 0 0 0    Cognitive Function MMSE - Mini Mental State Exam 09/25/2016 04/12/2015 03/07/2014  Not completed: - (No Data) -  Orientation to time 2 5 4   Orientation to Place 3 5 5   Registration 3 3 3   Attention/ Calculation 0 4 3  Recall 1 1 3   Language- name 2 objects 2 2 2   Language- repeat 1 1 1   Language- follow 3 step command 3 3 3   Language- read & follow direction 1 1 1   Write a sentence 0 1 1  Copy design 1 0 1  Total score 17 26 27      6CIT Screen 09/01/2018  What Year? 0 points  What month? 0 points  What time? 0 points  Count back from 20 2 points  Months in reverse 4 points  Repeat phrase 10 points  Total Score 16    Immunization History  Administered Date(s) Administered  . Influenza, High Dose Seasonal PF 11/26/2016, 12/11/2017  . Influenza,inj,Quad PF,6+ Mos 01/19/2014, 04/12/2015, 12/13/2015  . Pneumococcal Conjugate-13 04/12/2015  . Pneumococcal Polysaccharide-23 02/02/2013  . Td 04/07/2008   Qualifies for Shingles Vaccine? yes  Screening Tests Health Maintenance  Topic Date Due  .  TETANUS/TDAP  04/07/2018  . INFLUENZA VACCINE  11/06/2018  . PNA vac Low Risk Adult  Completed   Cancer Screenings: Lung: Low Dose CT Chest recommended if Age 20-80 years, 30 pack-year currently smoking OR have quit w/in 15years. Patient does not qualify. Colorectal: aged out  Additional Screenings:  Hepatitis C Screening:declines      Plan:     I have personally reviewed and  noted the following in the patient's chart:   . Medical and social history . Use of alcohol, tobacco or illicit drugs  . Current medications and supplements . Functional ability and status . Nutritional status . Physical activity . Advanced directives . List of other physicians . Hospitalizations, surgeries, and ER visits in previous 12 months . Vitals . Screenings to include cognitive, depression, and falls . Referrals and appointments  In addition, I have reviewed and discussed with patient certain preventive protocols, quality metrics, and best practice recommendations. A written personalized care plan for preventive services as well as general preventive health recommendations were provided to patient.     [name], NP  09/01/2018

## 2018-09-01 NOTE — Patient Instructions (Signed)
[name] , Thank you for taking time to come for your Medicare Wellness Visit. I appreciate your ongoing commitment to your health goals. Please review the following plan we discussed and let me know if I can assist you in the future.   Screening recommendations/referrals: Colonoscopy aged out Recommended yearly ophthalmology/optometry visit for glaucoma screening and checkup Recommended yearly dental visit for hygiene and checkup  Vaccinations: Influenza vaccine due 11/2018 Pneumococcal vaccine up to date Tdap vaccine up: rx sent to pharmacy, please request this at your pharmacy Shingles vaccine rx sent to pharmacy, please request this at your pharmacy  Advanced directives: please bring copy health care power of attorney and living will to office so we can have on file.   Conditions/risks identified: progressive memory loss  Next appointment: 1 year  Preventive Care 36 Years and Older, Male Preventive care refers to lifestyle choices and visits with your health care provider that can promote health and wellness. What does preventive care include?  A yearly physical exam. This is also called an annual well check.  Dental exams once or twice a year.  Routine eye exams. Ask your health care provider how often you should have your eyes checked.  Personal lifestyle choices, including:  Daily care of your teeth and gums.  Regular physical activity.  Eating a healthy diet.  Avoiding tobacco and drug use.  Limiting alcohol use.  Practicing safe sex.  Taking low doses of aspirin every day.  Taking vitamin and mineral supplements as recommended by your health care provider. What happens during an annual well check? The services and screenings done by your health care provider during your annual well check will depend on your age, overall health, lifestyle risk factors, and family history of disease. Counseling  Your health care provider may ask you questions about your:   Alcohol use.  Tobacco use.  Drug use.  Emotional well-being.  Home and relationship well-being.  Sexual activity.  Eating habits.  History of falls.  Memory and ability to understand (cognition).  Work and work Statistician. Screening  You may have the following tests or measurements:  Height, weight, and BMI.  Blood pressure.  Lipid and cholesterol levels. These may be checked every 5 years, or more frequently if you are over 68 years old.  Skin check.  Lung cancer screening. You may have this screening every year starting at age 23 if you have a 30-pack-year history of smoking and currently smoke or have quit within the past 15 years.  Fecal occult blood test (FOBT) of the stool. You may have this test every year starting at age 80.  Flexible sigmoidoscopy or colonoscopy. You may have a sigmoidoscopy every 5 years or a colonoscopy every 10 years starting at age 47.  Prostate cancer screening. Recommendations will vary depending on your family history and other risks.  Hepatitis C blood test.  Hepatitis B blood test.  Sexually transmitted disease (STD) testing.  Diabetes screening. This is done by checking your blood sugar (glucose) after you have not eaten for a while (fasting). You may have this done every 1-3 years.  Abdominal aortic aneurysm (AAA) screening. You may need this if you are a current or former smoker.  Osteoporosis. You may be screened starting at age 60 if you are at high risk. Talk with your health care provider about your test results, treatment options, and if necessary, the need for more tests. Vaccines  Your health care provider may recommend certain vaccines, such as:  Influenza vaccine. This is recommended every year.  Tetanus, diphtheria, and acellular pertussis (Tdap, Td) vaccine. You may need a Td booster every 10 years.  Zoster vaccine. You may need this after age 19.  Pneumococcal 13-valent conjugate (PCV13) vaccine. One dose is  recommended after age 32.  Pneumococcal polysaccharide (PPSV23) vaccine. One dose is recommended after age 51. Talk to your health care provider about which screenings and vaccines you need and how often you need them. This information is not intended to replace advice given to you by your health care provider. Make sure you discuss any questions you have with your health care provider. Document Released: 04/20/2015 Document Revised: 12/12/2015 Document Reviewed: 01/23/2015 Elsevier Interactive Patient Education  2017 [place] Prevention in the Home Falls can cause injuries. They can happen to people of all ages. There are many things you can do to make your home safe and to help prevent falls. What can I do on the outside of my home?  Regularly fix the edges of walkways and driveways and fix any cracks.  Remove anything that might make you trip as you walk through a door, such as a raised step or threshold.  Trim any bushes or trees on the path to your home.  Use bright outdoor lighting.  Clear any walking paths of anything that might make someone trip, such as rocks or tools.  Regularly check to see if handrails are loose or broken. Make sure that both sides of any steps have handrails.  Any raised decks and porches should have guardrails on the edges.  Have any leaves, snow, or ice cleared regularly.  Use sand or salt on walking paths during winter.  Clean up any spills in your garage right away. This includes oil or grease spills. What can I do in the bathroom?  Use night lights.  Install grab bars by the toilet and in the tub and shower. Do not use towel bars as grab bars.  Use non-skid mats or decals in the tub or shower.  If you need to sit down in the shower, use a plastic, non-slip stool.  Keep the floor dry. Clean up any water that spills on the floor as soon as it happens.  Remove soap buildup in the tub or shower regularly.  Attach bath mats  securely with double-sided non-slip rug tape.  Do not have throw rugs and other things on the floor that can make you trip. What can I do in the bedroom?  Use night lights.  Make sure that you have a light by your bed that is easy to reach.  Do not use any sheets or blankets that are too big for your bed. They should not hang down onto the floor.  Have a firm chair that has side arms. You can use this for support while you get dressed.  Do not have throw rugs and other things on the floor that can make you trip. What can I do in the kitchen?  Clean up any spills right away.  Avoid walking on wet floors.  Keep items that you use a lot in easy-to-reach places.  If you need to reach something above you, use a strong step stool that has a grab bar.  Keep electrical cords out of the way.  Do not use floor polish or wax that makes floors slippery. If you must use wax, use non-skid floor wax.  Do not have throw rugs and other things on the floor that  can make you trip. What can I do with my stairs?  Do not leave any items on the stairs.  Make sure that there are handrails on both sides of the stairs and use them. Fix handrails that are broken or loose. Make sure that handrails are as long as the stairways.  Check any carpeting to make sure that it is firmly attached to the stairs. Fix any carpet that is loose or worn.  Avoid having throw rugs at the top or bottom of the stairs. If you do have throw rugs, attach them to the floor with carpet tape.  Make sure that you have a light switch at the top of the stairs and the bottom of the stairs. If you do not have them, ask someone to add them for you. What else can I do to help prevent falls?  Wear shoes that:  Do not have high heels.  Have rubber bottoms.  Are comfortable and fit you well.  Are closed at the toe. Do not wear sandals.  If you use a stepladder:  Make sure that it is fully opened. Do not climb a closed  stepladder.  Make sure that both sides of the stepladder are locked into place.  Ask someone to hold it for you, if possible.  Clearly mark and make sure that you can see:  Any grab bars or handrails.  First and last steps.  Where the edge of each step is.  Use tools that help you move around (mobility aids) if they are needed. These include:  Canes.  Walkers.  Scooters.  Crutches.  Turn on the lights when you go into a dark area. Replace any light bulbs as soon as they burn out.  Set up your furniture so you have a clear path. Avoid moving your furniture around.  If any of your floors are uneven, fix them.  If there are any pets around you, be aware of where they are.  Review your medicines with your doctor. Some medicines can make you feel dizzy. This can increase your chance of falling. Ask your doctor what other things that you can do to help prevent falls. This information is not intended to replace advice given to you by your health care provider. Make sure you discuss any questions you have with your health care provider. Document Released: 01/18/2009 Document Revised: 08/30/2015 Document Reviewed: 04/28/2014 Elsevier Interactive Patient Education  2017 [employer].

## 2018-11-30 ENCOUNTER — Other Ambulatory Visit: Payer: Self-pay

## 2018-11-30 ENCOUNTER — Encounter: Payer: Self-pay | Admitting: Cardiovascular Disease

## 2018-11-30 ENCOUNTER — Ambulatory Visit (INDEPENDENT_AMBULATORY_CARE_PROVIDER_SITE_OTHER): Payer: Medicare Other | Admitting: Cardiovascular Disease

## 2018-11-30 VITALS — BP 126/62 | HR 61 | Ht 68.0 in | Wt 121.0 lb

## 2018-11-30 DIAGNOSIS — I10 Essential (primary) hypertension: Secondary | ICD-10-CM

## 2018-11-30 DIAGNOSIS — Z720 Tobacco use: Secondary | ICD-10-CM

## 2018-11-30 DIAGNOSIS — E785 Hyperlipidemia, unspecified: Secondary | ICD-10-CM | POA: Diagnosis not present

## 2018-11-30 DIAGNOSIS — I739 Peripheral vascular disease, unspecified: Secondary | ICD-10-CM

## 2018-11-30 DIAGNOSIS — I480 Paroxysmal atrial fibrillation: Secondary | ICD-10-CM

## 2018-11-30 NOTE — Patient Instructions (Signed)
Medication Instructions:  Your physician recommends that you continue on your current medications as directed. Please refer to the Current Medication list given to you today.  If you need a refill on your cardiac medications before your next appointment, please call your pharmacy.   Lab work: None ordered If you have labs (blood work) drawn today and your tests are completely normal, you will receive your results only by: [place] (if you have MyChart) OR A paper copy in the mail If you have any lab test that is abnormal or we need to change your treatment, we will call you to review the results.  Testing/Procedures: None ordered  Follow-Up: At [facility], you and your health needs are our priority.  As part of our continuing mission to provide you with exceptional heart care, we have created designated Provider Care Teams.  These Care Teams include your primary Cardiologist (physician) and Advanced Practice Providers (APPs -  Physician Assistants and Nurse Practitioners) who all work together to provide you with the care you need, when you need it. You will need a follow up appointment in 6 months.  Please call our office 2 months in advance to schedule this appointment.  You may see Kathlyn Sacramento, MD or one of the following Advanced Practice Providers on your designated Care Team:   Kerin Ransom, [name] [address] [name] [place], [place]

## 2018-11-30 NOTE — Progress Notes (Signed)
Cardiology Office Note   Date:  11/30/2018   ID:  [name] 1940-06-24, MRN [id number]  PCP:  [name], NP  Cardiologist: Dr. Radford Pax  No chief complaint on file.     History of Present Illness: [name] is a 78 y.o. male who Is here today for a follow-up visit regarding peripheral arterial disease. 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 He was not felt to be a good candidate for anticoagulation given history of heavy alcohol use. Echocardiogram in 2018 showed normal LV systolic function. He had a rash with diltiazem. He was seen in 2018 for a pressure ulcer on the left heel.  He underwent noninvasive vascular evaluation which showed an ABI of 0.52 on the left with significant left common and external iliac artery stenosis and an occluded left mid SFA. Angiography in May, 2018 showed moderate heavily calcified iliac disease bilaterally with short occlusion of the left distal SFA with three-vessel runoff below the knee. Attempted angioplasty of the left SFA was not successful due to inability to cross the occlusion. He was scheduled for a staged procedure via the retrograde anterior tibial artery access. However, he was hospitalized for pneumonia. 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 Left heel ulceration healed without revascularization at that time.  He has been doing well with no recent chest pain, shortness of breath or palpitations.  He denies leg claudication or ulceration.  He takes his medications regularly.  He lives with his brother.  He does not drive. Most recent lower extremity arterial Doppler in January 2020 showed an ABI of 0.62 on the right and 0.46 on the left.   He continues to drink beer almost daily but he  reports that he cut down.  He continues to smoke and is not able to quit.   Past Medical History:  Diagnosis Date  . Alcohol abuse   . Anemia   . Aneurysm ([place])   . CKD (chronic kidney disease), stage III ([place])   . COPD (chronic obstructive pulmonary disease) ([place])   . Dysrhythmia   . Fever of unknown origin 08/2016  . History of atrial fibrillation   . Hyperchloremia   . Hypercholesteremia   . Hyperpotassemia   . Hypertension   . Hypertensive renal disease, benign   . Leg edema   . PAD (peripheral artery disease) ([place])   . Pneumonia   . Pressure ulcer of left heel   . Seizures ([place])    has not had a seizure in 15 yrs  . Tobacco use   . Weight loss     Past Surgical History:  Procedure Laterality Date  . ABDOMINAL AORTOGRAM W/LOWER EXTREMITY N/A 08/13/2016   Procedure: Abdominal Aortogram w/Lower Extremity;  Surgeon: Wellington Hampshire, MD;  Location: [facility];  Service: Cardiovascular;  Laterality: N/A;  . 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, 1996  . EYE SURGERY     December 2017  . PERIPHERAL VASCULAR BALLOON ANGIOPLASTY  08/13/2016   Procedure: Peripheral Vascular Balloon Angioplasty;  Surgeon: Wellington Hampshire, MD;  Location: [facility];  Service: Cardiovascular;;  Aborted     Current Outpatient Medications  Medication Sig Dispense Refill  . albuterol (PROVENTIL HFA;VENTOLIN HFA) 108 (90 Base) MCG/ACT inhaler Inhale 2 puffs into the lungs every  6 (six) hours as needed for wheezing or shortness of breath. 1 Inhaler 2  . amLODipine (NORVASC) 10 MG tablet Take 1 tablet (10 mg total) by mouth daily. 30 tablet 0  . aspirin EC 81 MG tablet Take 1 tablet (81 mg total) by mouth daily. 90 tablet 3  . atorvastatin (LIPITOR) 20 MG tablet TAKE 1 TABLET BY MOUTH EVERY DAY 90 tablet 1  . Carboxymethylcellulose Sodium (EYE DROPS OP) Apply 1 drop to eye daily as needed (dry eyes).    . carvedilol (COREG) 3.125 MG tablet TAKE 1 TABLET (3.125 MG TOTAL) BY MOUTH 2 (TWO)  TIMES DAILY WITH A MEAL. 180 tablet 3  . Cyanocobalamin (VITAMIN B 12 PO) Take 1 tablet by mouth daily.    . divalproex (DEPAKOTE) 250 MG DR tablet Take one tablet by mouth three times daily for seizures 270 tablet 1  . guaiFENesin (MUCINEX) 600 MG 12 hr tablet Take 1 tablet (600 mg total) by mouth 2 (two) times daily. 30 tablet 0  . guaiFENesin (ROBITUSSIN) 100 MG/5ML SOLN Take 5 mLs (100 mg total) by mouth every 4 (four) hours as needed for cough or to loosen phlegm. 236 mL 0  . hydrALAZINE (APRESOLINE) 50 MG tablet Take 1 tablet (50 mg total) by mouth 3 (three) times daily. 90 tablet 0  . levETIRAcetam (KEPPRA) 500 MG tablet TAKE 1 TABLET BY MOUTH TWICE A DAY 180 tablet 1  . memantine (NAMENDA) 10 MG tablet TAKE 1 TABLET BY MOUTH TWICE A DAY 180 tablet 3  . Multiple Vitamins-Minerals (THEREMS-M) TABS TAKE 1 TABLET BY MOUTH EVERY DAY 90 tablet 3  . thiamine ([employer] B-1) 100 MG tablet Take 1 tablet (100 mg total) by mouth daily. 30 tablet 3   No current facility-administered medications for this visit.     Allergies:   Diltiazem and Penicillins    Social History:  The patient  reports that he has been smoking cigarettes. He has a 14.00 pack-year smoking history. He has never used smokeless tobacco. He reports current alcohol use. He reports that he does not use drugs.   Family History:  The patient's family history includes Alzheimer's disease in his sister; Cancer in his mother; Clotting disorder in his sister and sister; Congestive Heart Failure in his brother, father, and sister.    ROS:  Please see the history of present illness.   Otherwise, review of systems are positive for none.   All other systems are reviewed and negative.    PHYSICAL EXAM: VS:  BP 126/62   Pulse 61   Ht _0  (1.727 m)   Wt 121 lb (54.9 kg)   SpO2 96%   BMI 18.40 kg/m  , BMI Body mass index is 18.4 kg/m. GEN: Well nourished, well developed, in no acute distress  HEENT: normal  Neck: no JVD, carotid  bruits, or masses Cardiac: RRR; no rubs  . 2/6 systolic ejection murmur at the aortic area  Respiratory:  clear to auscultation bilaterally, normal work of breathing GI: soft, nontender, nondistended, + BS MS: no deformity or atrophy  Skin: warm and dry, no rash Neuro:  Strength and sensation are intact Psych: euthymic mood, full affect Vascular: Femoral pulses +1 bilaterally with bruits.   EKG:  EKG is not ordered today.    Recent Labs: 04/29/2018: Magnesium 2.1 05/25/2018: ALT 6; BUN 14; Creat 1.26; Hemoglobin 11.0; Platelets 136; Potassium 4.6; Sodium 142; TSH 1.88    Lipid Panel    Component Value Date/Time  CHOL 219 (H) 06/09/2017 1033   CHOL 206 (H) 07/13/2014 1130   TRIG 63 06/09/2017 1033   HDL 69 06/09/2017 1033   HDL 112 07/13/2014 1130   CHOLHDL 3.2 06/09/2017 1033   VLDL 10 04/15/2016 0211   LDLCALC 135 (H) 06/09/2017 1033      Wt Readings from Last 3 Encounters:  11/30/18 121 lb (54.9 kg)  06/01/18 127 lb 12.8 oz (58 kg)  05/04/18 128 lb 3.2 oz (58.2 kg)       No flowsheet data found.    ASSESSMENT AND PLAN:  1.  Peripheral arterial disease: The patient has evidence of severe peripheral arterial disease based on his ABI but at the present time he denies any pain and he has no ulceration.  No indication for revascularization at the present time.  I discussed with him the importance of proper foot hygiene and avoiding injuries.  2. Hyperlipidemia: Continue treatment with atorvastatin.  3.  Tobacco use: I discussed with him the importance of smoking cessation.  4. Paroxysmal atrial fibrillation: Currently in regular rhythm.  He continues to be a poor candidate for anticoagulation due to continued alcohol use, thrombocytopenia and unsteady gait.  5.  Essential hypertension: Blood pressure is controlled on current medications.    Disposition:   FU with me in 6 months   Signed,  Kathlyn Sacramento, MD  11/30/2018 9:01 AM    [place]

## 2018-12-02 ENCOUNTER — Encounter: Payer: Self-pay | Admitting: Nurse Practitioner

## 2018-12-02 ENCOUNTER — Other Ambulatory Visit: Payer: Self-pay

## 2018-12-02 ENCOUNTER — Ambulatory Visit (INDEPENDENT_AMBULATORY_CARE_PROVIDER_SITE_OTHER): Payer: Medicare Other | Admitting: Nurse Practitioner

## 2018-12-02 VITALS — BP 124/72 | HR 62 | Temp 98.2°F | Ht 68.0 in | Wt 121.0 lb

## 2018-12-02 DIAGNOSIS — I5032 Chronic diastolic (congestive) heart failure: Secondary | ICD-10-CM

## 2018-12-02 DIAGNOSIS — I10 Essential (primary) hypertension: Secondary | ICD-10-CM

## 2018-12-02 DIAGNOSIS — F1010 Alcohol abuse, uncomplicated: Secondary | ICD-10-CM | POA: Diagnosis not present

## 2018-12-02 DIAGNOSIS — F0391 Unspecified dementia with behavioral disturbance: Secondary | ICD-10-CM | POA: Diagnosis not present

## 2018-12-02 DIAGNOSIS — E785 Hyperlipidemia, unspecified: Secondary | ICD-10-CM

## 2018-12-02 DIAGNOSIS — Z23 Encounter for immunization: Secondary | ICD-10-CM | POA: Diagnosis not present

## 2018-12-02 DIAGNOSIS — R569 Unspecified convulsions: Secondary | ICD-10-CM

## 2018-12-02 NOTE — Progress Notes (Signed)
Careteam: Patient Care Team: [name], NP as PCP - General (Nurse Practitioner)  Advanced Directive information Does Patient Have a Medical Advance Directive?: Yes, Type of Advance Directive: [place], Does patient want to make changes to medical advance directive?: No - Patient declined  Allergies  Allergen Reactions  . Diltiazem Rash and Other (See Comments)    Blisters  . Penicillins Other (See Comments)    Unknown Childhood allergy  Has patient had a PCN reaction causing immediate rash, facial/tongue/throat swelling, SOB or lightheadedness with hypotension: unknown Has patient had a PCN reaction causing severe rash involving mucus membranes or skin necrosis: unknown Has patient had a PCN reaction that required hospitalization unknown Has patient had a PCN reaction occurring within the last 10 years:no If all of the above answers are "NO", then may proceed with Cephalosporin use.     Chief Complaint  Patient presents with  . Medical Management of Chronic Issues    3 month follow-up   . Leg Problem    Left leg concerns and numbness      HPI: Patient is a 78 y.o. male seen in the office today for routine follow up.  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 He was not felt to be a good candidate for anticoagulation given history of heavy alcohol use and falls.  PAD- recent vascular follow up. He denies any pain in LE or skin breakdown.  Continues on ASA and statin at this time. Most recent lower extremity arterial Doppler in January 2020 showed an ABI of 0.62 on the right and 0.46 on the left.  he had been complaints of left leg feeling stiff and some numbness (for about a day) in his foot but reports today it is fine and no numbness and walking good with good. No injuries  noted.    Hyperlipidemia- continues on atorvastatin.   Dementia- stable, without significant changes to memory noted. Lives with brother, niece does his medication.    CHF- stable, without swelling, chest pains, shortness of breath at this time; continues on coreg BID  htn- controlled cont current medication   Hx of seizures- without recent seizures, has been stable on Keppra   Review of Systems:  Review of Systems  Constitutional: Negative for chills, fever and weight loss.  HENT: Negative for tinnitus.   Respiratory: Negative for cough, sputum production and shortness of breath.   Cardiovascular: Negative for chest pain, palpitations and leg swelling.  Gastrointestinal: Negative for abdominal pain, constipation, diarrhea and heartburn.  Genitourinary: Negative for dysuria, frequency and urgency.  Musculoskeletal: Negative for back pain, falls, joint pain and myalgias.  Skin: Negative.   Neurological: Negative for dizziness and headaches.  Psychiatric/Behavioral: Positive for memory loss. Negative for depression. The patient does not have insomnia.     Past Medical History:  Diagnosis Date  . Alcohol abuse   . Anemia   . Aneurysm ([place])   . CKD (chronic kidney disease), stage III ([place])   . COPD (chronic obstructive pulmonary disease) ([place])   . Dysrhythmia   . Fever of unknown origin 08/2016  . History of atrial fibrillation   . Hyperchloremia   . Hypercholesteremia   . Hyperpotassemia   . Hypertension   . Hypertensive renal disease, benign   . Leg edema   . PAD (peripheral artery disease) (  HCC)   . Pneumonia   . Pressure ulcer of left heel   . Seizures ([place])    has not had a seizure in 15 yrs  . Tobacco use   . Weight loss    Past Surgical History:  Procedure Laterality Date  . ABDOMINAL AORTOGRAM W/LOWER EXTREMITY N/A 08/13/2016   Procedure: Abdominal Aortogram w/Lower Extremity;  Surgeon: Wellington Hampshire, MD;  Location: [facility];  Service:  Cardiovascular;  Laterality: N/A;  . 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, 1996  . EYE SURGERY     December 2017  . PERIPHERAL VASCULAR BALLOON ANGIOPLASTY  08/13/2016   Procedure: Peripheral Vascular Balloon Angioplasty;  Surgeon: Wellington Hampshire, MD;  Location: [facility];  Service: Cardiovascular;;  Aborted   Social History:   reports that he has been smoking cigarettes. He has a 14.00 pack-year smoking history. He has never used smokeless tobacco. He reports current alcohol use. He reports that he does not use drugs.  Family History  Problem Relation Age of Onset  . Cancer Mother   . Congestive Heart Failure Father   . Alzheimer's disease Sister   . Congestive Heart Failure Sister   . Clotting disorder Sister   . Congestive Heart Failure Brother   . Clotting disorder Sister     Medications: Patient's Medications  New Prescriptions   No medications on file  Previous Medications   ALBUTEROL (PROVENTIL HFA;VENTOLIN HFA) 108 (90 BASE) MCG/ACT INHALER    Inhale 2 puffs into the lungs every 6 (six) hours as needed for wheezing or shortness of breath.   AMLODIPINE (NORVASC) 10 MG TABLET    Take 1 tablet (10 mg total) by mouth daily.   ASPIRIN EC 81 MG TABLET    Take 1 tablet (81 mg total) by mouth daily.   ATORVASTATIN (LIPITOR) 20 MG TABLET    TAKE 1 TABLET BY MOUTH EVERY DAY   CARBOXYMETHYLCELLULOSE SODIUM (EYE DROPS OP)    Apply 1 drop to eye daily as needed (dry eyes).   CARVEDILOL (COREG) 3.125 MG TABLET    TAKE 1 TABLET (3.125 MG TOTAL) BY MOUTH 2 (TWO) TIMES DAILY WITH A MEAL.   CYANOCOBALAMIN (VITAMIN B 12 PO)    Take 1 tablet by mouth daily.   DIVALPROEX (DEPAKOTE) 250 MG DR TABLET    Take one tablet by mouth three times daily for seizures   GUAIFENESIN (MUCINEX) 600 MG 12 HR TABLET    Take 1 tablet (600 mg total) by mouth 2 (two) times daily.   GUAIFENESIN (ROBITUSSIN) 100 MG/5ML SOLN    Take 5 mLs (100 mg total) by mouth every 4 (four) hours as needed for cough or to  loosen phlegm.   HYDRALAZINE (APRESOLINE) 50 MG TABLET    Take 1 tablet (50 mg total) by mouth 3 (three) times daily.   LEVETIRACETAM (KEPPRA) 500 MG TABLET    TAKE 1 TABLET BY MOUTH TWICE A DAY   MEMANTINE (NAMENDA) 10 MG TABLET    TAKE 1 TABLET BY MOUTH TWICE A DAY   MULTIPLE VITAMINS-MINERALS (THEREMS-M) TABS    TAKE 1 TABLET BY MOUTH EVERY DAY   THIAMINE ([employer] B-1) 100 MG TABLET    Take 1 tablet (100 mg total) by mouth daily.  Modified Medications   No medications on file  Discontinued Medications   No medications on file    Physical Exam:  Vitals:   12/02/18 1516  BP: 124/72  Pulse: 62  Temp: 98.2 F (  36.8 C)  TempSrc: Oral  SpO2: 98%  Weight: 121 lb (54.9 kg)  Height: 5\' 8"  (1.727 m)   Body mass index is 18.4 kg/m. Wt Readings from Last 3 Encounters:  12/02/18 121 lb (54.9 kg)  11/30/18 121 lb (54.9 kg)  06/01/18 127 lb 12.8 oz (58 kg)    Physical Exam Constitutional:      General: He is not in acute distress. Eyes:     Pupils: Pupils are equal, round, and reactive to light.  Neck:     Musculoskeletal: Normal range of motion and neck supple.  Cardiovascular:     Rate and Rhythm: Normal rate and regular rhythm.  Pulmonary:     Effort: Pulmonary effort is normal. No respiratory distress.     Breath sounds: Normal breath sounds.  Abdominal:     General: Bowel sounds are normal.     Palpations: Abdomen is soft.  Musculoskeletal:        General: No tenderness.     Right lower leg: No edema.     Left lower leg: No edema.  Skin:    General: Skin is warm and dry.     Capillary Refill: Capillary refill takes less than 2 seconds.  Neurological:     Mental Status: He is alert and oriented to person, place, and time.  Psychiatric:        Mood and Affect: Mood normal.     Labs reviewed: Basic Metabolic Panel: Recent Labs    04/29/18 0342  05/01/18 0512 05/02/18 0453 05/25/18 0946  NA 142   < > 144 146* 142  K 5.4*   < > 4.2 3.9 4.6  CL 110   < > 114*  116* 107  CO2 23   < > 24 22 27   GLUCOSE 135*   < > 105* 82 89  BUN 30*   < > 35* 29* 14  CREATININE 1.48*   < > 1.11 1.02 1.26*  CALCIUM 8.1*   < > 8.2* 8.3* 9.1  MG 2.1  --   --   --   --   PHOS 4.9*  --   --   --   --   TSH 0.260*  --   --   --  1.88   < > = values in this interval not displayed.   Liver Function Tests: Recent Labs    04/28/18 1549 04/29/18 0342 05/25/18 0946  AST 32 32 14  ALT 18 18 6*  ALKPHOS 72 61  --   BILITOT 0.4 0.8 0.3  PROT 8.1 6.9 6.6  ALBUMIN 4.2 3.4*  --    No results for input(s): LIPASE, AMYLASE in the last 8760 hours. No results for input(s): AMMONIA in the last 8760 hours. CBC: Recent Labs    04/28/18 1549  05/01/18 0512 05/01/18 1255 05/25/18 0946  WBC 5.4   < > 8.6 8.9 9.0  NEUTROABS 3.1  --   --   --  6,300  HGB 13.0   < > 10.7* 11.6* 11.0*  HCT 42.9   < > 34.8* 37.4* 33.1*  MCV 102.6*   < > 100.3* 101.1* 94.6  PLT 100*   < > 84* 92* 136*   < > = values in this interval not displayed.   Lipid Panel: No results for input(s): CHOL, HDL, LDLCALC, TRIG, CHOLHDL, LDLDIRECT in the last 8760 hours. TSH: Recent Labs    04/29/18 0342 05/25/18 0946  TSH 0.260* 1.88   A1C: Lab Results  Component Value Date   HGBA1C 5.2 04/15/2016     Assessment/Plan 1. Need for influenza vaccination - Flu Vaccine QUAD High Dose(Fluad)  2. Dementia with behavioral disturbance, unspecified dementia type (HCC) Stable, continues on namenda. Has a lot of family support and lives with brother   3. Chronic diastolic heart failure (HCC) -stable on coreg twice daily.   4. Essential hypertension, benign - controlled on current regimen; continues on norvasc, coreg and hydralazine  - CBC with Differential/Platelet  5. Alcohol abuse Encouraged cessation.   6. Seizures ([place]) Stable without recurrent seizures, continues on keppra twice daily   7. Hyperlipidemia LDL goal <130 -continues on lipitor 20 mg daily  - Lipid Panel - COMPLETE  METABOLIC PANEL WITH GFR  Next appt: 4 months for routine follow up.  Carlos American. [place] [phone]

## 2018-12-03 LAB — COMPLETE METABOLIC PANEL WITH GFR
AG Ratio: 1.4 (calc) (ref 1.0–2.5)
ALT: 7 U/L — ABNORMAL LOW (ref 9–46)
AST: 16 U/L (ref 10–35)
Albumin: 4 g/dL (ref 3.6–5.1)
Alkaline phosphatase (APISO): 71 U/L (ref 35–144)
BUN/Creatinine Ratio: 18 (calc) (ref 6–22)
BUN: 25 mg/dL (ref 7–25)
CO2: 26 mmol/L (ref 20–32)
Calcium: 8.8 mg/dL (ref 8.6–10.3)
Chloride: 109 mmol/L (ref 98–110)
Creat: 1.38 mg/dL — ABNORMAL HIGH (ref 0.70–1.11)
GFR, Est African American: 56 mL/min/{1.73_m2} — ABNORMAL LOW (ref >=60)
GFR, Est Non African American: 48 mL/min/{1.73_m2} — ABNORMAL LOW (ref >=60)
Globulin: 2.8 g/dL (calc) (ref 1.9–3.7)
Glucose, Bld: 97 mg/dL (ref 65–139)
Potassium: 4.8 mmol/L (ref 3.5–5.3)
Sodium: 140 mmol/L (ref 135–146)
Total Bilirubin: 0.2 mg/dL (ref 0.2–1.2)
Total Protein: 6.8 g/dL (ref 6.1–8.1)

## 2018-12-03 LAB — CBC WITH DIFFERENTIAL/PLATELET
Absolute Monocytes: 540 cells/uL (ref 200–950)
Basophils Absolute: 40 cells/uL (ref 0–200)
Basophils Relative: 0.8 %
Eosinophils Absolute: 210 cells/uL (ref 15–500)
Eosinophils Relative: 4.2 %
HCT: 36.5 % — ABNORMAL LOW (ref 38.5–50.0)
Hemoglobin: 12.3 g/dL — ABNORMAL LOW (ref 13.2–17.1)
Lymphs Abs: 2460 cells/uL (ref 850–3900)
MCH: 30.9 pg (ref 27.0–33.0)
MCHC: 33.7 g/dL (ref 32.0–36.0)
MCV: 91.7 fL (ref 80.0–100.0)
MPV: 11.7 fL (ref 7.5–12.5)
Monocytes Relative: 10.8 %
Neutro Abs: 1750 cells/uL (ref 1500–7800)
Neutrophils Relative %: 35 %
Platelets: 129 10*3/uL — ABNORMAL LOW (ref 140–400)
RBC: 3.98 10*6/uL — ABNORMAL LOW (ref 4.20–5.80)
RDW: 14.2 % (ref 11.0–15.0)
Total Lymphocyte: 49.2 %
WBC: 5 10*3/uL (ref 3.8–10.8)

## 2018-12-03 LAB — LIPID PANEL
Cholesterol: 172 mg/dL (ref <200)
HDL: 58 mg/dL (ref >=40)
LDL Cholesterol (Calc): 91 mg/dL (calc)
Non-HDL Cholesterol (Calc): 114 mg/dL (calc) (ref <130)
Total CHOL/HDL Ratio: 3 (calc) (ref <5.0)
Triglycerides: 131 mg/dL (ref <150)

## 2018-12-15 ENCOUNTER — Other Ambulatory Visit: Payer: Self-pay | Admitting: Nurse Practitioner

## 2018-12-22 ENCOUNTER — Telehealth: Payer: Self-pay | Admitting: *Deleted

## 2018-12-22 NOTE — Telephone Encounter (Signed)
Nolon Stalls called regarding patient's medications.  She is not listed in patient's HIPPA.  I called patient and got VERBAL consent to speak with Nolon Stalls. Given Verbal Consent.   Freda Munro wanted to confirm patient was taking Levetiracetam and Hydralazine. Both medications are in patient's current medication list.

## 2019-01-07 ENCOUNTER — Other Ambulatory Visit: Payer: Self-pay | Admitting: Nurse Practitioner

## 2019-01-20 ENCOUNTER — Other Ambulatory Visit: Payer: Self-pay | Admitting: Nurse Practitioner

## 2019-01-20 DIAGNOSIS — I10 Essential (primary) hypertension: Secondary | ICD-10-CM

## 2019-01-20 MED ORDER — HYDRALAZINE HCL 50 MG PO TABS: 50.0000 mg | ORAL_TABLET | Freq: Three times a day (TID) | ORAL | 5 refills | Status: DC

## 2019-01-20 NOTE — Telephone Encounter (Signed)
Request interfaced through epic was fro hydralazine 50 mg 1 by mouth two time daily.  Current medication list states patient is to take hydralazine 50 mg three times daily. RX sent for what is on current list.

## 2019-03-25 ENCOUNTER — Encounter: Payer: Self-pay | Admitting: Nurse Practitioner

## 2019-03-27 ENCOUNTER — Other Ambulatory Visit: Payer: Self-pay | Admitting: Nurse Practitioner

## 2019-04-06 ENCOUNTER — Other Ambulatory Visit: Payer: Self-pay

## 2019-04-06 ENCOUNTER — Ambulatory Visit (INDEPENDENT_AMBULATORY_CARE_PROVIDER_SITE_OTHER): Payer: Medicare Other | Admitting: Nurse Practitioner

## 2019-04-06 ENCOUNTER — Encounter: Payer: Self-pay | Admitting: Nurse Practitioner

## 2019-04-06 DIAGNOSIS — I10 Essential (primary) hypertension: Secondary | ICD-10-CM | POA: Diagnosis not present

## 2019-04-06 DIAGNOSIS — F1010 Alcohol abuse, uncomplicated: Secondary | ICD-10-CM

## 2019-04-06 DIAGNOSIS — R569 Unspecified convulsions: Secondary | ICD-10-CM

## 2019-04-06 DIAGNOSIS — F0391 Unspecified dementia with behavioral disturbance: Secondary | ICD-10-CM

## 2019-04-06 DIAGNOSIS — E785 Hyperlipidemia, unspecified: Secondary | ICD-10-CM

## 2019-04-06 NOTE — Progress Notes (Signed)
This service is provided via telemedicine  No vital signs collected/recorded due to the encounter was a telemedicine visit.   Location of patient (ex: home, work):  Home  Patient consents to a telephone visit: Yes  Location of the provider (ex: office, home):  [facility], Office   Name of any referring provider:  N/A  Names of all persons participating in the telemedicine service and their role in the encounter:  S.Chrae B/CMA, [name], NP, and Patient   Time spent on call: 8 min with medical assistant     Careteam: Patient Care Team: [name], NP as PCP - General (Nurse Practitioner)  Advanced Directive information    Allergies  Allergen Reactions  . Diltiazem Rash and Other (See Comments)    Blisters  . Penicillins Other (See Comments)    Unknown Childhood allergy  Has patient had a PCN reaction causing immediate rash, facial/tongue/throat swelling, SOB or lightheadedness with hypotension: unknown Has patient had a PCN reaction causing severe rash involving mucus membranes or skin necrosis: unknown Has patient had a PCN reaction that required hospitalization unknown Has patient had a PCN reaction occurring within the last 10 years:no If all of the above answers are "NO", then may proceed with Cephalosporin use.     Chief Complaint  Patient presents with  . Medical Management of Chronic Issues    4 month follow-up. Telehealth  . Medication Management    Discuss Albuterol Inhaler   . Medication Management    Patient unsure of most of his medications for his sister manages and was not present at the time of call     HPI: Patient is a 78 y.o. male for 4 month follow up.  Sister was supposed to accompany him for the telephone visit but she is not available at this time. His niece does his medication and he is unsure of what he is actually taking.  He has a container that he puts his medication in.   Continues to "drink a cold beer  every now and then" anywhere from 1-2 times a week will have 1-3 beers.  Pt with hx COPD, hypertension, hyperlipidemia, seizure disorder, ETOH abuse, brain aneurysm status post clipping over 20 years ago, paroxysmal a fib  afib- no palpitations, not a candidate for anticoagulation due to hx of ETOH abuse and falls.   PAD- followed by vascular denies any pain or skin break down at this. No numbness, tingling or pain in the legs.   Hyperlipidemia- prescribed atorvastatin   htn- does not check bp at home. Was controlled at last visit. Unsure what medication he is taking in regards to his blood pressure  Dementia- pt is alone during visit and he has ongoing memory loss.    Review of Systems:  Review of Systems  Constitutional: Negative for chills, fever and weight loss.  HENT: Negative for hearing loss.   Respiratory: Negative for cough, sputum production and shortness of breath.   Cardiovascular: Negative for chest pain, palpitations and leg swelling.  Gastrointestinal: Negative for abdominal pain, constipation, diarrhea and heartburn.  Genitourinary: Negative for dysuria, frequency and urgency.  Musculoskeletal: Negative for back pain, falls, joint pain and myalgias.  Skin: Negative.   Neurological: Negative for dizziness and headaches.  Psychiatric/Behavioral: Positive for memory loss. Negative for depression. The patient is not nervous/anxious and does not have insomnia.     Past Medical History:  Diagnosis Date  . Alcohol abuse   . Anemia   . Aneurysm (  HCC)   . CKD (chronic kidney disease), stage III   . COPD (chronic obstructive pulmonary disease) ([place])   . Dysrhythmia   . Fever of unknown origin 08/2016  . History of atrial fibrillation   . Hyperchloremia   . Hypercholesteremia   . Hyperpotassemia   . Hypertension   . Hypertensive renal disease, benign   . Leg edema   . PAD (peripheral artery disease) ([place])   . Pneumonia   . Pressure ulcer of left heel   . Seizures  ([place])    has not had a seizure in 15 yrs  . Tobacco use   . Weight loss    Past Surgical History:  Procedure Laterality Date  . ABDOMINAL AORTOGRAM W/LOWER EXTREMITY N/A 08/13/2016   Procedure: Abdominal Aortogram w/Lower Extremity;  Surgeon: Wellington Hampshire, MD;  Location: [facility];  Service: Cardiovascular;  Laterality: N/A;  . 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, 1996  . EYE SURGERY     December 2017  . PERIPHERAL VASCULAR BALLOON ANGIOPLASTY  08/13/2016   Procedure: Peripheral Vascular Balloon Angioplasty;  Surgeon: Wellington Hampshire, MD;  Location: [facility];  Service: Cardiovascular;;  Aborted   Social History:   reports that he has been smoking cigarettes. He has a 14.00 pack-year smoking history. He has never used smokeless tobacco. He reports current alcohol use. He reports that he does not use drugs.  Family History  Problem Relation Age of Onset  . Cancer Mother   . Congestive Heart Failure Father   . Alzheimer's disease Sister   . Congestive Heart Failure Sister   . Clotting disorder Sister   . Congestive Heart Failure Brother   . Clotting disorder Sister     Medications: Patient's Medications  New Prescriptions   No medications on file  Previous Medications   ALBUTEROL (PROVENTIL HFA;VENTOLIN HFA) 108 (90 BASE) MCG/ACT INHALER    Inhale 2 puffs into the lungs every 6 (six) hours as needed for wheezing or shortness of breath.   AMLODIPINE (NORVASC) 10 MG TABLET    Take 1 tablet (10 mg total) by mouth daily.   ASPIRIN EC 81 MG TABLET    Take 1 tablet (81 mg total) by mouth daily.   ATORVASTATIN (LIPITOR) 20 MG TABLET    TAKE 1 TABLET BY MOUTH EVERY DAY   CARBOXYMETHYLCELLULOSE SODIUM (EYE DROPS OP)    Apply 1 drop to eye daily as needed (dry eyes).   CARVEDILOL (COREG) 3.125 MG TABLET    TAKE 1 TABLET (3.125 MG TOTAL) BY MOUTH 2 (TWO) TIMES DAILY WITH A MEAL.   CYANOCOBALAMIN (VITAMIN B 12 PO)    Take 1 tablet by mouth daily.   DIVALPROEX (DEPAKOTE)  250 MG DR TABLET    TAKE 1 TABLET BY MOUTH THREE TIMES A DAY AS NEEDED FOR SEIZURES   HYDRALAZINE (APRESOLINE) 50 MG TABLET    Take 1 tablet (50 mg total) by mouth 3 (three) times daily.   LEVETIRACETAM (KEPPRA) 500 MG TABLET    TAKE 1 TABLET BY MOUTH TWICE A DAY   MEMANTINE (NAMENDA) 10 MG TABLET    TAKE 1 TABLET BY MOUTH TWICE A DAY   MULTIPLE VITAMINS-MINERALS (THEREMS-M) TABS    TAKE 1 TABLET BY MOUTH EVERY DAY   THIAMINE ([employer]S B-1) 100 MG TABLET    Take 1 tablet (100 mg total) by mouth daily.  Modified Medications   No medications on file  Discontinued Medications   GUAIFENESIN (MUCINEX) 600 MG  12 HR TABLET    Take 1 tablet (600 mg total) by mouth 2 (two) times daily.   GUAIFENESIN (ROBITUSSIN) 100 MG/5ML SOLN    Take 5 mLs (100 mg total) by mouth every 4 (four) hours as needed for cough or to loosen phlegm.    Physical Exam:  There were no vitals filed for this visit. There is no height or weight on file to calculate BMI. Wt Readings from Last 3 Encounters:  12/02/18 121 lb (54.9 kg)  11/30/18 121 lb (54.9 kg)  06/01/18 127 lb 12.8 oz (58 kg)      Labs reviewed: Basic Metabolic Panel: Recent Labs    04/29/18 0342 04/29/18 0900 05/02/18 0453 05/25/18 0946 12/02/18 1543  NA 142   < > 146* 142 140  K 5.4*  --  3.9 4.6 4.8  CL 110   < > 116* 107 109  CO2 23   < > 22 27 26   GLUCOSE 135*   < > 82 89 97  BUN 30*   < > 29* 14 25  CREATININE 1.48*   < > 1.02 1.26* 1.38*  CALCIUM 8.1*   < > 8.3* 9.1 8.8  MG 2.1  --   --   --   --   PHOS 4.9*  --   --   --   --   TSH 0.260*  --   --  1.88  --    < > = values in this interval not displayed.   Liver Function Tests: Recent Labs    04/28/18 1549 04/29/18 0342 05/25/18 0946 12/02/18 1543  AST 32 32 14 16  ALT 18 18 6* 7*  ALKPHOS 72 61  --   --   BILITOT 0.4 0.8 0.3 0.2  PROT 8.1 6.9 6.6 6.8  ALBUMIN 4.2 3.4*  --   --    No results for input(s): LIPASE, AMYLASE in the last 8760 hours. No results for input(s):  AMMONIA in the last 8760 hours. CBC: Recent Labs    04/28/18 1549 04/28/18 2129 05/01/18 1255 05/25/18 0946 12/02/18 1543  WBC 5.4   < > 8.9 9.0 5.0  NEUTROABS 3.1  --   --  6,300 1,750  HGB 13.0   < > 11.6* 11.0* 12.3*  HCT 42.9   < > 37.4* 33.1* 36.5*  MCV 102.6*   < > 101.1* 94.6 91.7  PLT 100*   < > 92* 136* 129*   < > = values in this interval not displayed.   Lipid Panel: Recent Labs    12/02/18 1543  CHOL 172  HDL 58  LDLCALC 91  TRIG 131  CHOLHDL 3.0   TSH: Recent Labs    04/29/18 0342 05/25/18 0946  TSH 0.260* 1.88   A1C: Lab Results  Component Value Date   HGBA1C 5.2 04/15/2016     Assessment/Plan 1. Seizures ([place]) No recent seizures, continues on depakote TID   2. Essential hypertension, benign -unable to get blood pressure. He is unsure what medication he is taking and it looks like norvasc has not been refilled since #30/0 refill was given in January 2020.  3. Alcohol abuse -ongoing ETOH consumption, encouraged cessation   4. Dementia with behavioral disturbance, unspecified dementia type ([place]) -pt a very poor historian and visit is done alone. He appears to be going well but unable to tell what medication he is taking. His sister helps with medication and not available at time of visit.   5. Hyperlipidemia LDL  goal <130 Continues on lipitor daily, LDL at goal on last labs.   Next appt: 2 months Bird Swetz K. Harle Battiest  [facility] [phone]    Virtual Visit via Telephone Note  I connected with pt on 04/06/19 at  3:15 PM EST by telephone and verified that I am speaking with the correct person using two identifiers.  Location: Patient: home Provider: office   I discussed the limitations, risks, security and privacy concerns of performing an evaluation and management service by telephone and the availability of in person appointments. I also discussed with the patient that there may be a patient  responsible charge related to this service. The patient expressed understanding and agreed to proceed.   I discussed the assessment and treatment plan with the patient. The patient was provided an opportunity to ask questions and all were answered. The patient agreed with the plan and demonstrated an understanding of the instructions.   The patient was advised to call back or seek an in-person evaluation if the symptoms worsen or if the condition fails to improve as anticipated.  I provided 15 minutes of non-face-to-face time during this encounter.  Carlos American. Harle Battiest Avs printed and mailed

## 2019-06-03 ENCOUNTER — Ambulatory Visit (INDEPENDENT_AMBULATORY_CARE_PROVIDER_SITE_OTHER): Payer: Medicare PPO | Admitting: Nurse Practitioner

## 2019-06-03 ENCOUNTER — Other Ambulatory Visit: Payer: Self-pay

## 2019-06-03 ENCOUNTER — Encounter: Payer: Self-pay | Admitting: Nurse Practitioner

## 2019-06-03 VITALS — BP 110/68 | HR 62 | Temp 97.3°F | Ht 68.0 in | Wt 124.0 lb

## 2019-06-03 DIAGNOSIS — E440 Moderate protein-calorie malnutrition: Secondary | ICD-10-CM | POA: Diagnosis not present

## 2019-06-03 DIAGNOSIS — L602 Onychogryphosis: Secondary | ICD-10-CM | POA: Diagnosis not present

## 2019-06-03 DIAGNOSIS — F1010 Alcohol abuse, uncomplicated: Secondary | ICD-10-CM | POA: Diagnosis not present

## 2019-06-03 DIAGNOSIS — I739 Peripheral vascular disease, unspecified: Secondary | ICD-10-CM

## 2019-06-03 DIAGNOSIS — I10 Essential (primary) hypertension: Secondary | ICD-10-CM

## 2019-06-03 DIAGNOSIS — E785 Hyperlipidemia, unspecified: Secondary | ICD-10-CM

## 2019-06-03 DIAGNOSIS — F0391 Unspecified dementia with behavioral disturbance: Secondary | ICD-10-CM

## 2019-06-03 DIAGNOSIS — R569 Unspecified convulsions: Secondary | ICD-10-CM

## 2019-06-03 NOTE — Patient Instructions (Signed)
COVID-19 Vaccine Information can be found at: [email or web address] For questions related to vaccine distribution or appointments, please email [email or web address] or call [phone]    

## 2019-06-03 NOTE — Progress Notes (Signed)
Careteam: Patient Care Team: [name], NP as PCP - General (Nurse Practitioner)  Advanced Directive information Does Patient Have a Medical Advance Directive?: Yes, Type of Advance Directive: [place];Living will, Does patient want to make changes to medical advance directive?: No - Patient declined  Allergies  Allergen Reactions  . Diltiazem Rash and Other (See Comments)    Blisters  . Penicillins Other (See Comments)    Unknown Childhood allergy  Has patient had a PCN reaction causing immediate rash, facial/tongue/throat swelling, SOB or lightheadedness with hypotension: unknown Has patient had a PCN reaction causing severe rash involving mucus membranes or skin necrosis: unknown Has patient had a PCN reaction that required hospitalization unknown Has patient had a PCN reaction occurring within the last 10 years:no If all of the above answers are "NO", then may proceed with Cephalosporin use.     Chief Complaint  Patient presents with  . Medical Management of Chronic Issues    2 month follow-up.      HPI: Patient is a 79 y.o. male for routine follow up.  Pt with hx of COPD, ETOH abuse, hypertension, hyperlipidemia, brain aneurysm.   Dementia- pt is living with his brother who is not really watch or help him but his niece will help with medication. Sister helps him around the house and take him to appts and making sure he has food. She calls him frequently.   Weight loss- weigh up since last visit. Pt reports good appetite.   Hyperlipidemia- continues on atorvastatin   Afib- no palpitations. Not on anticoagulation duet o hx of ETOH abuse and falls.   PAD- no pain, skin breakdown.   htn- continues on coreg, norvasc,  ETOH abuse- continues on thiamine and b12 supplement, reports he "occasonally drink a beer"  Hx of seizures- controlled on keppra 500 mg BID. No seizures  Review of Systems:  Review of Systems  Constitutional: Negative  for chills, fever and weight loss.  HENT: Negative for tinnitus.   Respiratory: Negative for cough, sputum production and shortness of breath.   Cardiovascular: Negative for chest pain, palpitations and leg swelling.  Gastrointestinal: Negative for abdominal pain, constipation, diarrhea and heartburn.  Genitourinary: Negative for dysuria, frequency and urgency.  Musculoskeletal: Negative for back pain, falls, joint pain and myalgias.  Skin: Negative.   Neurological: Negative for dizziness and headaches.  Psychiatric/Behavioral: Positive for memory loss. Negative for depression. The patient does not have insomnia.     Past Medical History:  Diagnosis Date  . Alcohol abuse   . Anemia   . Aneurysm ([place])   . CKD (chronic kidney disease), stage III   . COPD (chronic obstructive pulmonary disease) ([place])   . Dysrhythmia   . Fever of unknown origin 08/2016  . History of atrial fibrillation   . Hyperchloremia   . Hypercholesteremia   . Hyperpotassemia   . Hypertension   . Hypertensive renal disease, benign   . Leg edema   . PAD (peripheral artery disease) ([place])   . Pneumonia   . Pressure ulcer of left heel   . Seizures ([place])    has not had a seizure in 15 yrs  . Tobacco use   . Weight loss    Past Surgical History:  Procedure Laterality Date  . ABDOMINAL AORTOGRAM W/LOWER EXTREMITY N/A 08/13/2016   Procedure: Abdominal Aortogram w/Lower Extremity;  Surgeon: Wellington Hampshire, MD;  Location: [facility];  Service: Cardiovascular;  Laterality: N/A;  .  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, 1996  . EYE SURGERY     December 2017  . PERIPHERAL VASCULAR BALLOON ANGIOPLASTY  08/13/2016   Procedure: Peripheral Vascular Balloon Angioplasty;  Surgeon: Wellington Hampshire, MD;  Location: [facility];  Service: Cardiovascular;;  Aborted   Social History:   reports that he has been smoking cigarettes. He has smoked for the past 56.00 years. He has never used smokeless tobacco. He reports  current alcohol use. He reports that he does not use drugs.  Family History  Problem Relation Age of Onset  . Cancer Mother   . Congestive Heart Failure Father   . Alzheimer's disease Sister   . Congestive Heart Failure Sister   . Clotting disorder Sister   . Congestive Heart Failure Brother   . Clotting disorder Sister     Medications: Patient's Medications  New Prescriptions   No medications on file  Previous Medications   ALBUTEROL (PROVENTIL HFA;VENTOLIN HFA) 108 (90 BASE) MCG/ACT INHALER    Inhale 2 puffs into the lungs every 6 (six) hours as needed for wheezing or shortness of breath.   AMLODIPINE (NORVASC) 10 MG TABLET    Take 1 tablet (10 mg total) by mouth daily.   ASPIRIN EC 81 MG TABLET    Take 1 tablet (81 mg total) by mouth daily.   ATORVASTATIN (LIPITOR) 20 MG TABLET    TAKE 1 TABLET BY MOUTH EVERY DAY   CARBOXYMETHYLCELLULOSE SODIUM (EYE DROPS OP)    Apply 1 drop to eye daily as needed (dry eyes).   CARVEDILOL (COREG) 3.125 MG TABLET    TAKE 1 TABLET (3.125 MG TOTAL) BY MOUTH 2 (TWO) TIMES DAILY WITH A MEAL.   CYANOCOBALAMIN (VITAMIN B 12 PO)    Take 1 tablet by mouth daily.   DIVALPROEX (DEPAKOTE) 250 MG DR TABLET    TAKE 1 TABLET BY MOUTH THREE TIMES A DAY AS NEEDED FOR SEIZURES   HYDRALAZINE (APRESOLINE) 50 MG TABLET    Take 1 tablet (50 mg total) by mouth 3 (three) times daily.   LEVETIRACETAM (KEPPRA) 500 MG TABLET    TAKE 1 TABLET BY MOUTH TWICE A DAY   MEMANTINE (NAMENDA) 10 MG TABLET    TAKE 1 TABLET BY MOUTH TWICE A DAY   MULTIPLE VITAMINS-MINERALS (THEREMS-M) TABS    TAKE 1 TABLET BY MOUTH EVERY DAY   THIAMINE ([facility] B-1) 100 MG TABLET    Take 1 tablet (100 mg total) by mouth daily.  Modified Medications   No medications on file  Discontinued Medications   No medications on file    Physical Exam:  Vitals:   06/03/19 1456  BP: 110/68  Pulse: 62  Temp: (!) 97.3 F (36.3 C)  TempSrc: Temporal  SpO2: 97%  Weight: 124 lb (56.2 kg)  Height: '5\' 8"'   (1.727 m)   Body mass index is 18.85 kg/m. Wt Readings from Last 3 Encounters:  06/03/19 124 lb (56.2 kg)  12/02/18 121 lb (54.9 kg)  11/30/18 121 lb (54.9 kg)    Physical Exam Constitutional:      General: He is not in acute distress.    Appearance: He is well-developed. He is not diaphoretic.  HENT:     Head: Normocephalic and atraumatic.     Mouth/Throat:     Pharynx: No oropharyngeal exudate.  Eyes:     Conjunctiva/sclera: Conjunctivae normal.     Pupils: Pupils are equal, round, and reactive to light.  Cardiovascular:  Rate and Rhythm: Normal rate and regular rhythm.     Heart sounds: Normal heart sounds.  Pulmonary:     Effort: Pulmonary effort is normal.     Breath sounds: Normal breath sounds.  Abdominal:     General: Bowel sounds are normal.     Palpations: Abdomen is soft.  Musculoskeletal:        General: No tenderness.     Cervical back: Normal range of motion and neck supple.  Skin:    General: Skin is warm and dry.  Neurological:     Mental Status: He is alert.     Labs reviewed: Basic Metabolic Panel: Recent Labs    12/02/18 1543  NA 140  K 4.8  CL 109  CO2 26  GLUCOSE 97  BUN 25  CREATININE 1.38*  CALCIUM 8.8   Liver Function Tests: Recent Labs    12/02/18 1543  AST 16  ALT 7*  BILITOT 0.2  PROT 6.8   No results for input(s): LIPASE, AMYLASE in the last 8760 hours. No results for input(s): AMMONIA in the last 8760 hours. CBC: Recent Labs    12/02/18 1543  WBC 5.0  NEUTROABS 1,750  HGB 12.3*  HCT 36.5*  MCV 91.7  PLT 129*   Lipid Panel: Recent Labs    12/02/18 1543  CHOL 172  HDL 58  LDLCALC 91  TRIG 131  CHOLHDL 3.0   TSH: No results for input(s): TSH in the last 8760 hours. A1C: Lab Results  Component Value Date   HGBA1C 5.2 04/15/2016     Assessment/Plan 1. PAD (peripheral artery disease) (HCC) -stable without pain to legs. Encouraged to monitor feet routinely to make sure skin intact.  -continues  on ASA 81 mg  - Ambulatory referral to Podiatry - CBC with Differential/Platelet  2. Hypertrophy of nail - Ambulatory referral to Podiatry  3. Malnutrition of moderate degree -weight is up, family helps make sure he is eating. Continues on supplements. - CMP with eGFR([facility])  4. Alcohol abuse -encouraged cessation. Continues on B12 and thiamine supplement.  - B12 and Folate Panel - CBC with Differential/Platelet  5. Dementia with behavioral disturbance, unspecified dementia type (HCC) -stable, overall has been stable, no acute changes in cognitive or behavioral status. Continues on namenda   6. Hyperlipidemia LDL goal <130 -continues on lipitor 20 mg daily, he has liberalized diet due to weight loss.   7. Essential hypertension, benign -stable on norvasc, coreg, hydralazine.   8. Seizures ([place]) -no recent seizures, continues on Keppra and depakote  - CBC with Differential/Platelet - CMP with eGFR([facility])   Next appt: 4 month for routine follow up [place]. [place], [facility] [phone]

## 2019-06-04 LAB — COMPLETE METABOLIC PANEL WITH GFR
AG Ratio: 1.5 (calc) (ref 1.0–2.5)
ALT: 23 U/L (ref 9–46)
AST: 26 U/L (ref 10–35)
Albumin: 3.8 g/dL (ref 3.6–5.1)
Alkaline phosphatase (APISO): 62 U/L (ref 35–144)
BUN/Creatinine Ratio: 19 (calc) (ref 6–22)
BUN: 23 mg/dL (ref 7–25)
CO2: 25 mmol/L (ref 20–32)
Calcium: 8.6 mg/dL (ref 8.6–10.3)
Chloride: 110 mmol/L (ref 98–110)
Creat: 1.19 mg/dL — ABNORMAL HIGH (ref 0.70–1.11)
GFR, Est African American: 66 mL/min/{1.73_m2} (ref >=60)
GFR, Est Non African American: 57 mL/min/{1.73_m2} — ABNORMAL LOW (ref >=60)
Globulin: 2.5 g/dL (calc) (ref 1.9–3.7)
Glucose, Bld: 118 mg/dL — ABNORMAL HIGH (ref 65–99)
Potassium: 4.6 mmol/L (ref 3.5–5.3)
Sodium: 143 mmol/L (ref 135–146)
Total Bilirubin: 0.3 mg/dL (ref 0.2–1.2)
Total Protein: 6.3 g/dL (ref 6.1–8.1)

## 2019-06-04 LAB — CBC WITH DIFFERENTIAL/PLATELET
Absolute Monocytes: 461 cells/uL (ref 200–950)
Basophils Absolute: 42 cells/uL (ref 0–200)
Basophils Relative: 0.8 %
Eosinophils Absolute: 111 cells/uL (ref 15–500)
Eosinophils Relative: 2.1 %
HCT: 35.4 % — ABNORMAL LOW (ref 38.5–50.0)
Hemoglobin: 11.6 g/dL — ABNORMAL LOW (ref 13.2–17.1)
Lymphs Abs: 2512 cells/uL (ref 850–3900)
MCH: 30.5 pg (ref 27.0–33.0)
MCHC: 32.8 g/dL (ref 32.0–36.0)
MCV: 93.2 fL (ref 80.0–100.0)
MPV: 11.7 fL (ref 7.5–12.5)
Monocytes Relative: 8.7 %
Neutro Abs: 2173 cells/uL (ref 1500–7800)
Neutrophils Relative %: 41 %
Platelets: 166 10*3/uL (ref 140–400)
RBC: 3.8 10*6/uL — ABNORMAL LOW (ref 4.20–5.80)
RDW: 13.6 % (ref 11.0–15.0)
Total Lymphocyte: 47.4 %
WBC: 5.3 10*3/uL (ref 3.8–10.8)

## 2019-06-04 LAB — B12 AND FOLATE PANEL
Folate: 15.9 ng/mL
Vitamin B-12: >2000 pg/mL — ABNORMAL HIGH (ref 200–1100)

## 2019-06-07 ENCOUNTER — Other Ambulatory Visit: Payer: Self-pay

## 2019-06-07 ENCOUNTER — Encounter: Payer: Self-pay | Admitting: Cardiovascular Disease

## 2019-06-07 ENCOUNTER — Ambulatory Visit (INDEPENDENT_AMBULATORY_CARE_PROVIDER_SITE_OTHER): Payer: Medicare PPO | Admitting: Cardiovascular Disease

## 2019-06-07 VITALS — BP 142/68 | HR 96 | Temp 95.8°F | Wt 126.0 lb

## 2019-06-07 DIAGNOSIS — E785 Hyperlipidemia, unspecified: Secondary | ICD-10-CM

## 2019-06-07 DIAGNOSIS — I480 Paroxysmal atrial fibrillation: Secondary | ICD-10-CM | POA: Diagnosis not present

## 2019-06-07 DIAGNOSIS — Z720 Tobacco use: Secondary | ICD-10-CM

## 2019-06-07 DIAGNOSIS — I10 Essential (primary) hypertension: Secondary | ICD-10-CM

## 2019-06-07 DIAGNOSIS — I739 Peripheral vascular disease, unspecified: Secondary | ICD-10-CM

## 2019-06-07 NOTE — Patient Instructions (Signed)

## 2019-06-07 NOTE — Progress Notes (Signed)
Cardiology Office Note   Date:  06/07/2019   ID:  [name], DOB [birth year], MRN [id number]  PCP:  [name], NP  Cardiologist: Dr. Radford Pax  No chief complaint on file.     History of Present Illness: [name] is a 79 y.o. male who Is here today for a follow-up visit regarding peripheral arterial disease. 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 He was not felt to be a good candidate for anticoagulation given history of heavy alcohol use. Echocardiogram in 2018 showed normal LV systolic function. He had a rash with diltiazem. He was seen in 2018 for a pressure ulcer on the left heel.  He underwent noninvasive vascular evaluation which showed an ABI of 0.52 on the left with significant left common and external iliac artery stenosis and an occluded left mid SFA. Angiography in May, 2018 showed moderate heavily calcified iliac disease bilaterally with short occlusion of the left distal SFA with three-vessel runoff below the knee. Attempted angioplasty of the left SFA was not successful due to inability to cross the occlusion. He was scheduled for a staged procedure via the retrograde anterior tibial artery access. However, he was hospitalized for pneumonia. 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 Left heel ulceration healed without revascularization at that time.  Most recent lower extremity arterial Doppler in January 2020 showed an ABI of 0.62 on the right and 0.46 on the left.   He has been doing well with no recent chest pain, shortness of breath or palpitations.  He denies claudication and he has no lower extremity ulceration.  He walks slowly with a cane.  He does have long toenails on both sides and he is going to see  podiatry.   Past Medical History:  Diagnosis Date  . Alcohol abuse   . Anemia   . Aneurysm ([place])   . CKD (chronic kidney disease), stage III   . COPD (chronic obstructive pulmonary disease) ([place])   . Dysrhythmia   . Fever of unknown origin 08/2016  . History of atrial fibrillation   . Hyperchloremia   . Hypercholesteremia   . Hyperpotassemia   . Hypertension   . Hypertensive renal disease, benign   . Leg edema   . PAD (peripheral artery disease) ([place])   . Pneumonia   . Pressure ulcer of left heel   . Seizures ([place])    has not had a seizure in 15 yrs  . Tobacco use   . Weight loss     Past Surgical History:  Procedure Laterality Date  . ABDOMINAL AORTOGRAM W/LOWER EXTREMITY N/A 08/13/2016   Procedure: Abdominal Aortogram w/Lower Extremity;  Surgeon: Wellington Hampshire, MD;  Location: [facility];  Service: Cardiovascular;  Laterality: N/A;  . 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, 1996  . EYE SURGERY     December 2017  . PERIPHERAL VASCULAR BALLOON ANGIOPLASTY  08/13/2016   Procedure: Peripheral Vascular Balloon Angioplasty;  Surgeon: Wellington Hampshire, MD;  Location: [facility];  Service: Cardiovascular;;  Aborted     Current Outpatient Medications  Medication Sig Dispense Refill  . albuterol (PROVENTIL HFA;VENTOLIN HFA) 108 (90 Base) MCG/ACT inhaler Inhale 2 puffs into the lungs every 6 (six) hours as needed for wheezing or shortness of breath. 1 Inhaler 2  .  amLODipine (NORVASC) 10 MG tablet Take 1 tablet (10 mg total) by mouth daily. 30 tablet 0  . aspirin EC 81 MG tablet Take 1 tablet (81 mg total) by mouth daily. 90 tablet 3  . atorvastatin (LIPITOR) 20 MG tablet TAKE 1 TABLET BY MOUTH EVERY DAY 90 tablet 1  . Carboxymethylcellulose Sodium (EYE DROPS OP) Apply 1 drop to eye daily as needed (dry eyes).    . carvedilol (COREG) 3.125 MG tablet TAKE 1 TABLET (3.125 MG TOTAL) BY MOUTH 2 (TWO) TIMES DAILY WITH A MEAL. 180 tablet 3  . Cyanocobalamin (VITAMIN B 12  PO) Take 1 tablet by mouth daily.    . divalproex (DEPAKOTE) 250 MG DR tablet TAKE 1 TABLET BY MOUTH THREE TIMES A DAY AS NEEDED FOR SEIZURES 270 tablet 1  . hydrALAZINE (APRESOLINE) 50 MG tablet Take 1 tablet (50 mg total) by mouth 3 (three) times daily. 90 tablet 5  . levETIRAcetam (KEPPRA) 500 MG tablet TAKE 1 TABLET BY MOUTH TWICE A DAY 180 tablet 1  . memantine (NAMENDA) 10 MG tablet TAKE 1 TABLET BY MOUTH TWICE A DAY 180 tablet 1  . Multiple Vitamins-Minerals (THEREMS-M) TABS TAKE 1 TABLET BY MOUTH EVERY DAY 90 tablet 3  . thiamine ([facility]S B-1) 100 MG tablet Take 1 tablet (100 mg total) by mouth daily. 30 tablet 3   No current facility-administered medications for this visit.    Allergies:   Diltiazem and Penicillins    Social History:  The patient  reports that he has been smoking cigarettes. He has smoked for the past 56.00 years. He has never used smokeless tobacco. He reports current alcohol use. He reports that he does not use drugs.   Family History:  The patient's family history includes Alzheimer's disease in his sister; Cancer in his mother; Clotting disorder in his sister and sister; Congestive Heart Failure in his brother, father, and sister.    ROS:  Please see the history of present illness.   Otherwise, review of systems are positive for none.   All other systems are reviewed and negative.    PHYSICAL EXAM: VS:  BP (!) 142/68   Pulse 96   Temp (!) 95.8 F (35.4 C)   Wt 126 lb (57.2 kg)   SpO2 99%   BMI 19.16 kg/m  , BMI Body mass index is 19.16 kg/m. GEN: Well nourished, well developed, in no acute distress  HEENT: normal  Neck: no JVD, carotid bruits, or masses Cardiac: RRR; no rubs  . 2/6 systolic ejection murmur at the aortic area  Respiratory:  clear to auscultation bilaterally, normal work of breathing GI: soft, nontender, nondistended, + BS MS: no deformity or atrophy  Skin: warm and dry, no rash Neuro:  Strength and sensation are intact Psych: euthymic  mood, full affect Vascular: Femoral pulses +1 bilaterally with bruits.  Dorsalis pedis is +1 on the right and not palpable on the left.  Posterior tibial is not palpable on both sides.  EKG:  EKG is ordered today. 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    Recent Labs: 06/03/2019: ALT 23; BUN 23; Creat 1.19; Hemoglobin 11.6; Platelets 166; Potassium 4.6; Sodium 143    Lipid Panel    Component Value Date/Time   CHOL 172 12/02/2018 1543   CHOL 206 (H) 07/13/2014 1130   TRIG 131 12/02/2018 1543   HDL 58 12/02/2018 1543   HDL 112 07/13/2014 1130   CHOLHDL 3.0 12/02/2018 1543   VLDL 10 04/15/2016 0211  [place] 91 12/02/2018 1543      Wt Readings from Last 3 Encounters:  06/07/19 126 lb (57.2 kg)  06/03/19 124 lb (56.2 kg)  12/02/18 121 lb (54.9 kg)       No flowsheet data found.    ASSESSMENT AND PLAN:  1.  Peripheral arterial disease: The patient has evidence of severe peripheral arterial disease especially on the left side.  However, he denies claudication likely due to somewhat limited mobility related to his balance issues.  I examined both feet and he has no evidence of ulceration.   No indication for revascularization at the present time.  I discussed with him the importance of proper foot hygiene and avoiding injuries.  2. Hyperlipidemia: Continue treatment with atorvastatin.  Most recent LDL was 91.  3.  Tobacco use: I discussed with him the importance of smoking cessation.  He reports that he cut down to only 2 cigarettes a day  4. Paroxysmal atrial fibrillation: Currently in regular rhythm.  He continues to be a poor candidate for anticoagulation due to continued alcohol use, thrombocytopenia and unsteady gait.  5.  Essential hypertension: Blood pressure is controlled on current medications.    Disposition:   FU with me in 6 months   Signed,  Kathlyn Sacramento, MD  06/07/2019 10:35 AM    [place]

## 2019-06-16 ENCOUNTER — Other Ambulatory Visit: Payer: Self-pay | Admitting: Nurse Practitioner

## 2019-06-18 ENCOUNTER — Other Ambulatory Visit: Payer: Self-pay | Admitting: Nurse Practitioner

## 2019-06-22 ENCOUNTER — Ambulatory Visit: Payer: Medicare PPO | Admitting: Podiatry

## 2019-06-22 ENCOUNTER — Other Ambulatory Visit: Payer: Self-pay

## 2019-06-22 DIAGNOSIS — I739 Peripheral vascular disease, unspecified: Secondary | ICD-10-CM

## 2019-06-22 DIAGNOSIS — M79675 Pain in left toe(s): Secondary | ICD-10-CM | POA: Diagnosis not present

## 2019-06-22 DIAGNOSIS — B351 Tinea unguium: Secondary | ICD-10-CM | POA: Diagnosis not present

## 2019-06-22 DIAGNOSIS — M79674 Pain in right toe(s): Secondary | ICD-10-CM | POA: Diagnosis not present

## 2019-06-23 ENCOUNTER — Encounter: Payer: Self-pay | Admitting: Podiatry

## 2019-06-23 NOTE — Progress Notes (Signed)
  Subjective:  Patient ID: [name], male    DOB: 11/23/1940,  MRN: [id number]  Chief Complaint  Patient presents with  . Nail Problem    pt is here for a nail trim   79 y.o. male returns for the above complaint.  Patient presents with complaint of thickened elongated mycotic dystrophic toenails x10.  Patient states that this is progressive gotten worse.  He has hard time ambulating because they are mildly painful.  He has tried self debridement but has not helped much.  He denies any other acute complaints.  He would like to have them debrided down.  Patient has history of peripheral vascular disease.  Objective:  There were no vitals filed for this visit. Podiatric Exam: Vascular: dorsalis pedis and posterior tibial pulses are palpable bilateral. Capillary return is immediate. Temperature gradient is WNL. Skin turgor WNL  Sensorium: Normal Semmes Weinstein monofilament test. Normal tactile sensation bilaterally. Nail Exam: Pt has thick disfigured discolored nails with subungual debris noted bilateral entire nail hallux through fifth toenails Ulcer Exam: There is no evidence of ulcer or pre-ulcerative changes or infection. Orthopedic Exam: Muscle tone and strength are WNL. No limitations in general ROM. No crepitus or effusions noted. HAV  B/L.  Hammer toes 2-5  B/L. Skin: No Porokeratosis. No infection or ulcers  Assessment & Plan:  Patient was evaluated and treated and all questions answered.  Onychomycosis with pain  -Nails palliatively debrided as below. -Educated on self-care  Procedure: Nail Debridement Rationale: pain  Type of Debridement: manual, sharp debridement. Instrumentation: Nail nipper, rotary burr. Number of Nails: 10  Procedures and Treatment: Consent by patient was obtained for treatment procedures. The patient understood the discussion of treatment and procedures well. All questions were answered thoroughly reviewed. Debridement of mycotic and hypertrophic  toenails, 1 through 5 bilateral and clearing of subungual debris. No ulceration, no infection noted.  Return Visit-Office Procedure: Patient instructed to return to the office for a follow up visit 3 months for continued evaluation and treatment.  Boneta Lucks, DPM    No follow-ups on file.

## 2019-07-14 ENCOUNTER — Other Ambulatory Visit: Payer: Self-pay | Admitting: Nurse Practitioner

## 2019-07-24 ENCOUNTER — Other Ambulatory Visit: Payer: Self-pay | Admitting: Nurse Practitioner

## 2019-07-24 DIAGNOSIS — I480 Paroxysmal atrial fibrillation: Secondary | ICD-10-CM

## 2019-08-08 ENCOUNTER — Other Ambulatory Visit: Payer: Self-pay | Admitting: Nurse Practitioner

## 2019-08-08 DIAGNOSIS — I10 Essential (primary) hypertension: Secondary | ICD-10-CM

## 2019-09-02 ENCOUNTER — Encounter: Payer: Medicare Other | Admitting: Nurse Practitioner

## 2019-09-03 ENCOUNTER — Other Ambulatory Visit: Payer: Self-pay | Admitting: Nurse Practitioner

## 2019-09-09 ENCOUNTER — Encounter: Payer: Medicare Other | Admitting: Nurse Practitioner

## 2019-09-19 ENCOUNTER — Encounter: Payer: Self-pay | Admitting: Nurse Practitioner

## 2019-09-19 ENCOUNTER — Other Ambulatory Visit: Payer: Self-pay

## 2019-09-19 ENCOUNTER — Ambulatory Visit (INDEPENDENT_AMBULATORY_CARE_PROVIDER_SITE_OTHER): Payer: Medicare PPO | Admitting: Nurse Practitioner

## 2019-09-19 ENCOUNTER — Telehealth: Payer: Self-pay

## 2019-09-19 DIAGNOSIS — Z0000 Encounter for general adult medical examination without abnormal findings: Secondary | ICD-10-CM

## 2019-09-19 NOTE — Patient Instructions (Signed)
Mr. [name] , Thank you for taking time to come for your Medicare Wellness Visit. I appreciate your ongoing commitment to your health goals. Please review the following plan we discussed and let me know if I can assist you in the future.   Screening recommendations/referrals: Colonoscopy aged out Recommended yearly ophthalmology/optometry visit for glaucoma screening and checkup Recommended yearly dental visit for hygiene and checkup  Vaccinations: Influenza vaccine DUE September 2021 Pneumococcal vaccine up to date Tdap vaccine -DUE- to get at your local pharmacy Shingles vaccine -DUE- to get at your local pharmacy    Advanced directives: recommend to bring advance directives into office, and to update MOST form in computer at the next office.   Conditions/risks identified: progressive memory loss, falls, cardiovascular disease   Next appointment: 1 year  Preventive Care 16 Years and Older, Male Preventive care refers to lifestyle choices and visits with your health care provider that can promote health and wellness. What does preventive care include?  A yearly physical exam. This is also called an annual well check.  Dental exams once or twice a year.  Routine eye exams. Ask your health care provider how often you should have your eyes checked.  Personal lifestyle choices, including:  Daily care of your teeth and gums.  Regular physical activity.  Eating a healthy diet.  Avoiding tobacco and drug use.  Limiting alcohol use.  Practicing safe sex.  Taking low doses of aspirin every day.  Taking vitamin and mineral supplements as recommended by your health care provider. What happens during an annual well check? The services and screenings done by your health care provider during your annual well check will depend on your age, overall health, lifestyle risk factors, and family history of disease. Counseling  Your health care provider may ask you questions about  your:  Alcohol use.  Tobacco use.  Drug use.  Emotional well-being.  Home and relationship well-being.  Sexual activity.  Eating habits.  History of falls.  Memory and ability to understand (cognition).  Work and work Statistician. Screening  You may have the following tests or measurements:  Height, weight, and BMI.  Blood pressure.  Lipid and cholesterol levels. These may be checked every 5 years, or more frequently if you are over 67 years old.  Skin check.  Lung cancer screening. You may have this screening every year starting at age 59 if you have a 30-pack-year history of smoking and currently smoke or have quit within the past 15 years.  Fecal occult blood test (FOBT) of the stool. You may have this test every year starting at age 61.  Flexible sigmoidoscopy or colonoscopy. You may have a sigmoidoscopy every 5 years or a colonoscopy every 10 years starting at age 32.  Prostate cancer screening. Recommendations will vary depending on your family history and other risks.  Hepatitis C blood test.  Hepatitis B blood test.  Sexually transmitted disease (STD) testing.  Diabetes screening. This is done by checking your blood sugar (glucose) after you have not eaten for a while (fasting). You may have this done every 1-3 years.  Abdominal aortic aneurysm (AAA) screening. You may need this if you are a current or former smoker.  Osteoporosis. You may be screened starting at age 65 if you are at high risk. Talk with your health care provider about your test results, treatment options, and if necessary, the need for more tests. Vaccines  Your health care provider may recommend certain vaccines, such as:  Influenza  vaccine. This is recommended every year.  Tetanus, diphtheria, and acellular pertussis (Tdap, Td) vaccine. You may need a Td booster every 10 years.  Zoster vaccine. You may need this after age 25.  Pneumococcal 13-valent conjugate (PCV13) vaccine.  One dose is recommended after age 76.  Pneumococcal polysaccharide (PPSV23) vaccine. One dose is recommended after age 43. Talk to your health care provider about which screenings and vaccines you need and how often you need them. This information is not intended to replace advice given to you by your health care provider. Make sure you discuss any questions you have with your health care provider. Document Released: 04/20/2015 Document Revised: 12/12/2015 Document Reviewed: 01/23/2015 Elsevier Interactive Patient Education  2017 [place] Prevention in the Home Falls can cause injuries. They can happen to people of all ages. There are many things you can do to make your home safe and to help prevent falls. What can I do on the outside of my home?  Regularly fix the edges of walkways and driveways and fix any cracks.  Remove anything that might make you trip as you walk through a door, such as a raised step or threshold.  Trim any bushes or trees on the path to your home.  Use bright outdoor lighting.  Clear any walking paths of anything that might make someone trip, such as rocks or tools.  Regularly check to see if handrails are loose or broken. Make sure that both sides of any steps have handrails.  Any raised decks and porches should have guardrails on the edges.  Have any leaves, snow, or ice cleared regularly.  Use sand or salt on walking paths during winter.  Clean up any spills in your garage right away. This includes oil or grease spills. What can I do in the bathroom?  Use night lights.  Install grab bars by the toilet and in the tub and shower. Do not use towel bars as grab bars.  Use non-skid mats or decals in the tub or shower.  If you need to sit down in the shower, use a plastic, non-slip stool.  Keep the floor dry. Clean up any water that spills on the floor as soon as it happens.  Remove soap buildup in the tub or shower regularly.  Attach bath  mats securely with double-sided non-slip rug tape.  Do not have throw rugs and other things on the floor that can make you trip. What can I do in the bedroom?  Use night lights.  Make sure that you have a light by your bed that is easy to reach.  Do not use any sheets or blankets that are too big for your bed. They should not hang down onto the floor.  Have a firm chair that has side arms. You can use this for support while you get dressed.  Do not have throw rugs and other things on the floor that can make you trip. What can I do in the kitchen?  Clean up any spills right away.  Avoid walking on wet floors.  Keep items that you use a lot in easy-to-reach places.  If you need to reach something above you, use a strong step stool that has a grab bar.  Keep electrical cords out of the way.  Do not use floor polish or wax that makes floors slippery. If you must use wax, use non-skid floor wax.  Do not have throw rugs and other things on the floor that can  make you trip. What can I do with my stairs?  Do not leave any items on the stairs.  Make sure that there are handrails on both sides of the stairs and use them. Fix handrails that are broken or loose. Make sure that handrails are as long as the stairways.  Check any carpeting to make sure that it is firmly attached to the stairs. Fix any carpet that is loose or worn.  Avoid having throw rugs at the top or bottom of the stairs. If you do have throw rugs, attach them to the floor with carpet tape.  Make sure that you have a light switch at the top of the stairs and the bottom of the stairs. If you do not have them, ask someone to add them for you. What else can I do to help prevent falls?  Wear shoes that:  Do not have high heels.  Have rubber bottoms.  Are comfortable and fit you well.  Are closed at the toe. Do not wear sandals.  If you use a stepladder:  Make sure that it is fully opened. Do not climb a closed  stepladder.  Make sure that both sides of the stepladder are locked into place.  Ask someone to hold it for you, if possible.  Clearly mark and make sure that you can see:  Any grab bars or handrails.  First and last steps.  Where the edge of each step is.  Use tools that help you move around (mobility aids) if they are needed. These include:  Canes.  Walkers.  Scooters.  Crutches.  Turn on the lights when you go into a dark area. Replace any light bulbs as soon as they burn out.  Set up your furniture so you have a clear path. Avoid moving your furniture around.  If any of your floors are uneven, fix them.  If there are any pets around you, be aware of where they are.  Review your medicines with your doctor. Some medicines can make you feel dizzy. This can increase your chance of falling. Ask your doctor what other things that you can do to help prevent falls. This information is not intended to replace advice given to you by your health care provider. Make sure you discuss any questions you have with your health care provider. Document Released: 01/18/2009 Document Revised: 08/30/2015 Document Reviewed: 04/28/2014 Elsevier Interactive Patient Education  2017 [employer].

## 2019-09-19 NOTE — Progress Notes (Signed)
° °  This service is provided via telemedicine  No vital signs collected/recorded due to the encounter was a telemedicine visit.   Location of patient (ex: home, work):  Home   Patient consents to a telephone visit:  Yes, see telephone encounter dated 09/19/2019 with annual consent    Location of the provider (ex: office, home):  [facility], Office  Name of any referring provider:  N/A  Names of all persons participating in the telemedicine service and their role in the encounter:  S.Chrae B/CMA, [name], NP,  Virl Cagey (patients sister) and Patient   Time spent on call:  13 min with medical assistant

## 2019-09-19 NOTE — Progress Notes (Signed)
Subjective:   [name] is a 79 y.o. male who presents for Medicare Annual/Subsequent preventive examination.  Review of Systems:   Cardiac Risk Factors include: family history of premature cardiovascular disease;sedentary lifestyle;smoking/ tobacco exposure;hypertension;male gender;dyslipidemia;advanced age (>41men, >50 women)     Objective:    Vitals: There were no vitals taken for this visit.  There is no height or weight on file to calculate BMI.  Advanced Directives 09/19/2019 06/03/2019 12/02/2018 09/01/2018 04/28/2018 04/28/2018 02/02/2018  Does Patient Have a Medical Advance Directive? Yes Yes Yes Yes Yes Yes No  Type of Paramedic of [place];Living will Healthcare Power of [name];Living will -  Does patient want to make changes to medical advance directive? No - Patient declined No - Patient declined No - Patient declined No - Patient declined No - Patient declined - -  Copy of [place] in Chart? No - copy requested Yes - validated most recent copy scanned in chart (See row information) No - copy requested No - copy requested No - copy requested - -  Would patient like information on creating a medical advance directive? - - - - - - -    Tobacco Social History   Tobacco Use  Smoking Status Current Some Day Smoker  . Years: 56.00  . Types: Cigarettes  Smokeless Tobacco Never Used  Tobacco Comment   2-3 daily, Smokes occasionally      Ready to quit: Not Answered Counseling given: Not Answered Comment: 2-3 daily, Smokes occasionally    Clinical Intake:  Pre-visit preparation completed: No  Pain : No/denies pain     BMI - recorded: 19 Nutritional Status: BMI of 19-24  Normal Nutritional Risks: Unintentional weight loss  How often do you need to have someone help you when you read  instructions, pamphlets, or other written materials from your doctor or pharmacy?: 5 - Always        Past Medical History:  Diagnosis Date  . Alcohol abuse   . Anemia   . Aneurysm ([place])   . CKD (chronic kidney disease), stage III   . COPD (chronic obstructive pulmonary disease) ([place])   . Dysrhythmia   . Fever of unknown origin 08/2016  . History of atrial fibrillation   . Hyperchloremia   . Hypercholesteremia   . Hyperpotassemia   . Hypertension   . Hypertensive renal disease, benign   . Leg edema   . PAD (peripheral artery disease) ([place])   . Pneumonia   . Pressure ulcer of left heel   . Seizures ([place])    has not had a seizure in 15 yrs  . Tobacco use   . Weight loss    Past Surgical History:  Procedure Laterality Date  . ABDOMINAL AORTOGRAM W/LOWER EXTREMITY N/A 08/13/2016   Procedure: Abdominal Aortogram w/Lower Extremity;  Surgeon: Wellington Hampshire, MD;  Location: [facility];  Service: Cardiovascular;  Laterality: N/A;  . 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, 1996  . EYE SURGERY     December 2017  . PERIPHERAL VASCULAR BALLOON ANGIOPLASTY  08/13/2016   Procedure: Peripheral Vascular Balloon Angioplasty;  Surgeon: Wellington Hampshire, MD;  Location: [facility];  Service: Cardiovascular;;  Aborted   Family History  Problem Relation Age of Onset  . Cancer Mother   . Congestive Heart Failure Father   . Alzheimer's disease Sister   .  Congestive Heart Failure Sister   . Clotting disorder Sister   . Congestive Heart Failure Brother   . Clotting disorder Sister    Social History   Socioeconomic History  . Marital status: Single    Spouse name: Not on file  . Number of children: Not on file  . Years of education: Not on file  . Highest education level: Not on file  Occupational History  . Not on file  Tobacco Use  . Smoking status: Current Some Day Smoker    Years: 56.00    Types: Cigarettes  . Smokeless tobacco: Never Used  . Tobacco comment: 2-3  daily, Smokes occasionally   Vaping Use  . Vaping Use: Never used  Substance and Sexual Activity  . Alcohol use: Yes    Alcohol/week: 0.0 standard drinks    Comment: beer occasionally   . Drug use: No  . Sexual activity: Not Currently  Other Topics Concern  . Not on file  Social History Narrative  . Not on file   Social Determinants of Health   Financial Resource Strain:   . Difficulty of Paying Living Expenses:   Food Insecurity:   . Worried About Charity fundraiser in the Last Year:   . Arboriculturist in the Last Year:   Transportation Needs:   . Film/video editor (Medical):   Marland Kitchen Lack of Transportation (Non-Medical):   Physical Activity:   . Days of Exercise per Week:   . Minutes of Exercise per Session:   Stress:   . Feeling of Stress :   Social Connections:   . Frequency of Communication with Friends and Family:   . Frequency of Social Gatherings with Friends and Family:   . Attends Religious Services:   . Active Member of Clubs or Organizations:   . Attends Archivist Meetings:   Marland Kitchen Marital Status:     Outpatient Encounter Medications as of 09/19/2019  Medication Sig  . albuterol (PROVENTIL HFA;VENTOLIN HFA) 108 (90 Base) MCG/ACT inhaler Inhale 2 puffs into the lungs every 6 (six) hours as needed for wheezing or shortness of breath.  Marland Kitchen amLODipine (NORVASC) 10 MG tablet Take 1 tablet (10 mg total) by mouth daily.  Marland Kitchen aspirin EC 81 MG tablet Take 1 tablet (81 mg total) by mouth daily.  Marland Kitchen atorvastatin (LIPITOR) 20 MG tablet TAKE 1 TABLET BY MOUTH EVERY DAY  . Carboxymethylcellulose Sodium (EYE DROPS OP) Apply 1 drop to eye daily as needed (dry eyes).  . carvedilol (COREG) 3.125 MG tablet TAKE 1 TABLET (3.125 MG TOTAL) BY MOUTH 2 (TWO) TIMES DAILY WITH A MEAL.  Marland Kitchen Cyanocobalamin (VITAMIN B 12 PO) Take 1 tablet by mouth daily.  . divalproex (DEPAKOTE) 250 MG DR tablet TAKE 1 TABLET BY MOUTH THREE TIMES A DAY AS NEEDED FOR SEIZURES  . hydrALAZINE  (APRESOLINE) 50 MG tablet TAKE 1 TABLET BY MOUTH THREE TIMES A DAY  . levETIRAcetam (KEPPRA) 500 MG tablet TAKE 1 TABLET BY MOUTH TWICE A DAY  . memantine (NAMENDA) 10 MG tablet TAKE 1 TABLET BY MOUTH TWICE A DAY  . Multiple Vitamins-Minerals (THEREMS-M) TABS TAKE 1 TABLET BY MOUTH EVERY DAY  . thiamine ([employer] B-1) 100 MG tablet Take 1 tablet (100 mg total) by mouth daily.   No facility-administered encounter medications on file as of 09/19/2019.    Activities of Daily Living In your present state of health, do you have any difficulty performing the following activities: 09/19/2019  Hearing? N  Vision? N  Difficulty concentrating or making decisions? Y  Walking or climbing stairs? N  Comment uses walking cane  Dressing or bathing? Y  Doing errands, shopping? Y  Comment does not Physiological scientist and eating ? N  Using the Toilet? N  In the past six months, have you accidently leaked urine? N  Do you have problems with loss of bowel control? N  Managing your Medications? Y  Comment sister helps  Managing your Finances? Y  Comment sister helps  Housekeeping or managing your Housekeeping? Y  Comment sister helps  Some recent data might be hidden    Patient Care Team: [name], NP as PCP - General (Nurse Practitioner)   Assessment:   This is a routine wellness examination for [name].  Exercise Activities and Dietary recommendations Current Exercise Habits: The patient does not participate in regular exercise at present, Exercise limited by: psychological condition(s);neurologic condition(s)  Goals    . DIET - INCREASE WATER INTAKE       Fall Risk Fall Risk  09/19/2019 06/03/2019 04/06/2019 12/02/2018 09/01/2018  Falls in the past year? 0 0 0 0 0  Number falls in past yr: 0 0 0 0 0  Comment - - - - -  Injury with Fall? 0 0 0 0 0  Risk for fall due to : - - - - -   Is the patient's home free of loose throw rugs in walkways, pet beds, electrical cords, etc?   yes       Grab bars in the bathroom? no      Handrails on the stairs?   yes      Adequate lighting?   yes  Timed Get Up and Go Performed: na  Depression Screen PHQ 2/9 Scores 09/19/2019 06/03/2019 04/06/2019 12/02/2018  PHQ - 2 Score 0 0 0 0    Cognitive Function MMSE - Mini Mental State Exam 09/25/2016 04/12/2015 03/07/2014  Not completed: - (No Data) -  Orientation to time 2 5 4   Orientation to Place 3 5 5   Registration 3 3 3   Attention/ Calculation 0 4 3  Recall 1 1 3   Language- name 2 objects 2 2 2   Language- repeat 1 1 1   Language- follow 3 step command 3 3 3   Language- read & follow direction 1 1 1   Write a sentence 0 1 1  Copy design 1 0 1  Total score 17 26 27      6CIT Screen 09/19/2019 09/01/2018  What Year? 0 points 0 points  What month? 0 points 0 points  What time? 0 points 0 points  Count back from 20 4 points 2 points  Months in reverse 2 points 4 points  Repeat phrase 10 points 10 points  Total Score 16 16    Immunization History  Administered Date(s) Administered  . Fluad Quad(high Dose 65+) 12/02/2018  . Influenza, High Dose Seasonal PF 11/26/2016, 12/11/2017  . Influenza,inj,Quad PF,6+ Mos 01/19/2014, 04/12/2015, 12/13/2015  . Pneumococcal Conjugate-13 04/12/2015  . Pneumococcal Polysaccharide-23 02/02/2013  . Td 04/07/2008    Qualifies for Shingles Vaccine? Yes, recommended  Screening Tests Health Maintenance  Topic Date Due  . COVID-19 Vaccine (1) Never done  . TETANUS/TDAP  04/07/2018  . INFLUENZA VACCINE  11/06/2019  . PNA vac Low Risk Adult  Completed   Cancer Screenings: Lung: Low Dose CT Chest recommended if Age 79-80 years, 30 pack-year currently smoking OR have quit w/in 15years. Patient does not qualify. Colorectal: aged  out  Additional Screenings:  Hepatitis C Screening:na      Plan:     I have personally reviewed and noted the following in the patient's chart:   . Medical and social history . Use of alcohol, tobacco or illicit drugs   . Current medications and supplements . Functional ability and status . Nutritional status . Physical activity . Advanced directives . List of other physicians . Hospitalizations, surgeries, and ER visits in previous 12 months . Vitals . Screenings to include cognitive, depression, and falls . Referrals and appointments  In addition, I have reviewed and discussed with patient certain preventive protocols, quality metrics, and best practice recommendations. A written personalized care plan for preventive services as well as general preventive health recommendations were provided to patient.     [name], NP  09/19/2019

## 2019-09-19 NOTE — Telephone Encounter (Signed)
Mr. [name], [name] are scheduled for a virtual visit with your provider today.    Just as we do with appointments in the office, we must obtain your consent to participate.  Your consent will be active for this visit and any virtual visit you may have with one of our providers in the next 365 days.    If you have a MyChart account, I can also send a copy of this consent to you electronically.  All virtual visits are billed to your insurance company just like a traditional visit in the office.  As this is a virtual visit, video technology does not allow for your provider to perform a traditional examination.  This may limit your provider's ability to fully assess your condition.  If your provider identifies any concerns that need to be evaluated in person or the need to arrange testing such as labs, EKG, etc, we will make arrangements to do so.    Although advances in technology are sophisticated, we cannot ensure that it will always work on either your end or our end.  If the connection with a video visit is poor, we may have to switch to a telephone visit.  With either a video or telephone visit, we are not always able to ensure that we have a secure connection.   I need to obtain your verbal consent now.   Are you willing to proceed with your visit today?   [name] has provided verbal consent on 09/19/2019 for a virtual visit (video or telephone).  Patients sister Virl Cagey was also present to give consent.    Leigh Aurora [place], [place] 09/19/2019  1:18 PM

## 2019-10-03 ENCOUNTER — Ambulatory Visit (INDEPENDENT_AMBULATORY_CARE_PROVIDER_SITE_OTHER): Payer: Medicare PPO | Admitting: Nurse Practitioner

## 2019-10-03 ENCOUNTER — Encounter: Payer: Self-pay | Admitting: Nurse Practitioner

## 2019-10-03 ENCOUNTER — Other Ambulatory Visit: Payer: Self-pay

## 2019-10-03 VITALS — BP 122/62 | HR 59 | Temp 97.5°F | Ht 68.0 in | Wt 121.7 lb

## 2019-10-03 DIAGNOSIS — D696 Thrombocytopenia, unspecified: Secondary | ICD-10-CM

## 2019-10-03 DIAGNOSIS — I480 Paroxysmal atrial fibrillation: Secondary | ICD-10-CM

## 2019-10-03 DIAGNOSIS — R569 Unspecified convulsions: Secondary | ICD-10-CM

## 2019-10-03 DIAGNOSIS — E782 Mixed hyperlipidemia: Secondary | ICD-10-CM

## 2019-10-03 DIAGNOSIS — Z7189 Other specified counseling: Secondary | ICD-10-CM

## 2019-10-03 DIAGNOSIS — J449 Chronic obstructive pulmonary disease, unspecified: Secondary | ICD-10-CM

## 2019-10-03 DIAGNOSIS — F0391 Unspecified dementia with behavioral disturbance: Secondary | ICD-10-CM | POA: Diagnosis not present

## 2019-10-03 DIAGNOSIS — N184 Chronic kidney disease, stage 4 (severe): Secondary | ICD-10-CM | POA: Insufficient documentation

## 2019-10-03 DIAGNOSIS — I5032 Chronic diastolic (congestive) heart failure: Secondary | ICD-10-CM | POA: Diagnosis not present

## 2019-10-03 DIAGNOSIS — F17200 Nicotine dependence, unspecified, uncomplicated: Secondary | ICD-10-CM

## 2019-10-03 LAB — CBC WITH DIFFERENTIAL/PLATELET
Absolute Monocytes: 572 cells/uL (ref 200–950)
Basophils Absolute: 32 cells/uL (ref 0–200)
Basophils Relative: 0.6 %
Eosinophils Absolute: 69 cells/uL (ref 15–500)
Eosinophils Relative: 1.3 %
HCT: 36.5 % — ABNORMAL LOW (ref 38.5–50.0)
Hemoglobin: 12.2 g/dL — ABNORMAL LOW (ref 13.2–17.1)
Lymphs Abs: 2624 cells/uL (ref 850–3900)
MCH: 30.5 pg (ref 27.0–33.0)
MCHC: 33.4 g/dL (ref 32.0–36.0)
MCV: 91.3 fL (ref 80.0–100.0)
MPV: 11.8 fL (ref 7.5–12.5)
Monocytes Relative: 10.8 %
Neutro Abs: 2003 cells/uL (ref 1500–7800)
Neutrophils Relative %: 37.8 %
Platelets: 120 10*3/uL — ABNORMAL LOW (ref 140–400)
RBC: 4 10*6/uL — ABNORMAL LOW (ref 4.20–5.80)
RDW: 13.1 % (ref 11.0–15.0)
Total Lymphocyte: 49.5 %
WBC: 5.3 10*3/uL (ref 3.8–10.8)

## 2019-10-03 LAB — BASIC METABOLIC PANEL WITH GFR
BUN/Creatinine Ratio: 14 (calc) (ref 6–22)
BUN: 21 mg/dL (ref 7–25)
CO2: 26 mmol/L (ref 20–32)
Calcium: 8.6 mg/dL (ref 8.6–10.3)
Chloride: 109 mmol/L (ref 98–110)
Creat: 1.48 mg/dL — ABNORMAL HIGH (ref 0.70–1.11)
GFR, Est African American: 51 mL/min/{1.73_m2} — ABNORMAL LOW (ref >=60)
GFR, Est Non African American: 44 mL/min/{1.73_m2} — ABNORMAL LOW (ref >=60)
Glucose, Bld: 100 mg/dL (ref 65–139)
Potassium: 5.2 mmol/L (ref 3.5–5.3)
Sodium: 140 mmol/L (ref 135–146)

## 2019-10-03 LAB — LIPID PANEL
Cholesterol: 153 mg/dL (ref <200)
HDL: 52 mg/dL (ref >=40)
LDL Cholesterol (Calc): 84 mg/dL (calc)
Non-HDL Cholesterol (Calc): 101 mg/dL (calc) (ref <130)
Total CHOL/HDL Ratio: 2.9 (calc) (ref <5.0)
Triglycerides: 78 mg/dL (ref <150)

## 2019-10-03 NOTE — Progress Notes (Signed)
Careteam: Patient Care Team: [name], NP as PCP - General (Nurse Practitioner)  PLACE OF SERVICE:  [place] Directive information Does Patient Have a Medical Advance Directive?: Yes, Type of Advance Directive: [place];Living will, Does patient want to make changes to medical advance directive?: No - Patient declined  Allergies  Allergen Reactions  . Diltiazem Rash and Other (See Comments)    Blisters  . Penicillins Other (See Comments)    Unknown Childhood allergy  Has patient had a PCN reaction causing immediate rash, facial/tongue/throat swelling, SOB or lightheadedness with hypotension: unknown Has patient had a PCN reaction causing severe rash involving mucus membranes or skin necrosis: unknown Has patient had a PCN reaction that required hospitalization unknown Has patient had a PCN reaction occurring within the last 10 years:no If all of the above answers are "NO", then may proceed with Cephalosporin use.     Chief Complaint  Patient presents with  . Medical Management of Chronic Issues    4 months follow up     HPI: Patient is a 79 y.o. male for routine follow up.  Continues to live with family who helps with meals and bringing him to office visit.   COPD- stable, no recent flares  htn- stable on norvasc 10 mg daily with coreg 3.125 mg BID  PAD- stable, no pain to LE, continues on asa 81 mg daily  Seizures- without recent seizures, continues on depakote   Dementia- stable, lives with brother, sister takes him to appts. He does not eat like he should per sister. No acute changes.    Review of Systems:  Review of Systems  Constitutional: Negative for chills, fever and weight loss.  HENT: Negative for tinnitus.   Respiratory: Negative for cough, sputum production and shortness of breath.   Cardiovascular: Negative for chest pain, palpitations and leg swelling.  Gastrointestinal: Negative for abdominal pain,  constipation, diarrhea and heartburn.  Genitourinary: Negative for dysuria, frequency and urgency.  Musculoskeletal: Negative for back pain, falls, joint pain and myalgias.  Skin: Negative.   Neurological: Negative for dizziness and headaches.  Psychiatric/Behavioral: Positive for memory loss. Negative for depression. The patient is not nervous/anxious and does not have insomnia.      Past Medical History:  Diagnosis Date  . Alcohol abuse   . Anemia   . Aneurysm ([place])   . CKD (chronic kidney disease), stage III   . COPD (chronic obstructive pulmonary disease) ([place])   . Dysrhythmia   . Fever of unknown origin 08/2016  . History of atrial fibrillation   . Hyperchloremia   . Hypercholesteremia   . Hyperpotassemia   . Hypertension   . Hypertensive renal disease, benign   . Leg edema   . PAD (peripheral artery disease) ([place])   . Pneumonia   . Pressure ulcer of left heel   . Seizures ([place])    has not had a seizure in 15 yrs  . Tobacco use   . Weight loss    Past Surgical History:  Procedure Laterality Date  . ABDOMINAL AORTOGRAM W/LOWER EXTREMITY N/A 08/13/2016   Procedure: Abdominal Aortogram w/Lower Extremity;  Surgeon: Wellington Hampshire, MD;  Location: [facility];  Service: Cardiovascular;  Laterality: N/A;  . 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, 1996  . EYE SURGERY     December 2017  . PERIPHERAL VASCULAR BALLOON ANGIOPLASTY  08/13/2016   Procedure: Peripheral Vascular Balloon Angioplasty;  Surgeon: Wellington Hampshire, MD;  Location: [facility];  Service: Cardiovascular;;  Aborted   Social History:   reports that he has been smoking cigarettes. He has smoked for the past 56.00 years. He has never used smokeless tobacco. He reports current alcohol use. He reports that he does not use drugs.  Family History  Problem Relation Age of Onset  . Cancer Mother   . Congestive Heart Failure Father   . Alzheimer's disease Sister   . Congestive Heart Failure Sister   .  Clotting disorder Sister   . Congestive Heart Failure Brother   . Clotting disorder Sister     Medications: Patient's Medications  New Prescriptions   No medications on file  Previous Medications   ALBUTEROL (PROVENTIL HFA;VENTOLIN HFA) 108 (90 BASE) MCG/ACT INHALER    Inhale 2 puffs into the lungs every 6 (six) hours as needed for wheezing or shortness of breath.   AMLODIPINE (NORVASC) 10 MG TABLET    Take 1 tablet (10 mg total) by mouth daily.   ASPIRIN EC 81 MG TABLET    Take 1 tablet (81 mg total) by mouth daily.   ATORVASTATIN (LIPITOR) 20 MG TABLET    TAKE 1 TABLET BY MOUTH EVERY DAY   CARBOXYMETHYLCELLULOSE SODIUM (EYE DROPS OP)    Apply 1 drop to eye daily as needed (dry eyes).   CARVEDILOL (COREG) 3.125 MG TABLET    TAKE 1 TABLET (3.125 MG TOTAL) BY MOUTH 2 (TWO) TIMES DAILY WITH A MEAL.   CYANOCOBALAMIN (VITAMIN B 12 PO)    Take 1 tablet by mouth daily.   DIVALPROEX (DEPAKOTE) 250 MG DR TABLET    TAKE 1 TABLET BY MOUTH THREE TIMES A DAY AS NEEDED FOR SEIZURES   HYDRALAZINE (APRESOLINE) 50 MG TABLET    TAKE 1 TABLET BY MOUTH THREE TIMES A DAY   LEVETIRACETAM (KEPPRA) 500 MG TABLET    TAKE 1 TABLET BY MOUTH TWICE A DAY   MEMANTINE (NAMENDA) 10 MG TABLET    TAKE 1 TABLET BY MOUTH TWICE A DAY   MULTIPLE VITAMINS-MINERALS (THEREMS-M) TABS    TAKE 1 TABLET BY MOUTH EVERY DAY   THIAMINE (CVS B-1) 100 MG TABLET    Take 1 tablet (100 mg total) by mouth daily.  Modified Medications   No medications on file  Discontinued Medications   No medications on file    Physical Exam:  Vitals:   10/03/19 1449  BP: 122/62  Pulse: (!) 59  Temp: (!) 97.5 F (36.4 C)  SpO2: 97%  Weight: 121 lb 11.2 oz (55.2 kg)  Height: 5\' 8"  (1.727 m)   Body mass index is 18.5 kg/m. Wt Readings from Last 3 Encounters:  10/03/19 121 lb 11.2 oz (55.2 kg)  06/07/19 126 lb (57.2 kg)  06/03/19 124 lb (56.2 kg)    Physical Exam Constitutional:      General: He is not in acute distress.    Appearance:  He is well-developed. He is not diaphoretic.  HENT:     Head: Normocephalic and atraumatic.     Mouth/Throat:     Pharynx: No oropharyngeal exudate.  Eyes:     Conjunctiva/sclera: Conjunctivae normal.     Pupils: Pupils are equal, round, and reactive to light.  Cardiovascular:     Rate and Rhythm: Normal rate and regular rhythm.     Heart sounds: Normal heart sounds.  Pulmonary:     Effort: Pulmonary effort is normal.     Breath sounds: Normal breath sounds.  Abdominal:  General: Bowel sounds are normal.     Palpations: Abdomen is soft.  Musculoskeletal:        General: No tenderness.     Cervical back: Normal range of motion and neck supple.  Skin:    General: Skin is warm and dry.  Neurological:     Mental Status: He is alert. Mental status is at baseline.  Psychiatric:        Mood and Affect: Mood normal.        Behavior: Behavior normal.     Labs reviewed: Basic Metabolic Panel: Recent Labs    12/02/18 1543 06/03/19 1517  NA 140 143  K 4.8 4.6  CL 109 110  CO2 26 25  GLUCOSE 97 118*  BUN 25 23  CREATININE 1.38* 1.19*  CALCIUM 8.8 8.6   Liver Function Tests: Recent Labs    12/02/18 1543 06/03/19 1517  AST 16 26  ALT 7* 23  BILITOT 0.2 0.3  PROT 6.8 6.3   No results for input(s): LIPASE, AMYLASE in the last 8760 hours. No results for input(s): AMMONIA in the last 8760 hours. CBC: Recent Labs    12/02/18 1543 06/03/19 1517  WBC 5.0 5.3  NEUTROABS 1,750 2,173  HGB 12.3* 11.6*  HCT 36.5* 35.4*  MCV 91.7 93.2  PLT 129* 166   Lipid Panel: Recent Labs    12/02/18 1543  CHOL 172  HDL 58  LDLCALC 91  TRIG 131  CHOLHDL 3.0   TSH: No results for input(s): TSH in the last 8760 hours. A1C: Lab Results  Component Value Date   HGBA1C 5.2 04/15/2016     Assessment/Plan 1. COPD, group B, by GOLD 2017 classification ([place]) Stable, no recent flares, has cut back on smoking.   2. Chronic diastolic heart failure (HCC) -stable, euvolmic,  continues on coreg BID.  - BASIC METABOLIC PANEL WITH GFR  3. Thrombocytopenia ([place]) -will monitor. - CBC with Differential/Platelet  4. Dementia with behavioral disturbance, unspecified dementia type (HCC) Stable, without acute changes to cognitive or functional status. Continues on namenda  5. Tobacco use disorder Has decreased cigarettes, encouraged cessation  6. Seizures ([place]) No recent seizures noted.   7. Advance care planning MOST form discussed with pts, pt brother and sister and HCPOA. Completed and copies given - DNR (Do Not Resuscitate)  8. Paroxysmal atrial fibrillation (HCC) Rate controlled, continues on ASA, high fall risk therefore not on anticoaguation.  9. Mixed hyperlipidemia -continues on lipitor 20 mg daily, discussed heart healthy diet.  - Lipid Panel  10. Chronic kidney disease, stage 4 (severe) (HCC) Encourage proper hydration and to avoid NSAIDS (Aleve, Advil, Motrin, Ibuprofen)  Will follow up labs today  Next appt: 4 months. sonner if needed Eloisa Chokshi K. [place], [facility] [phone]

## 2019-10-03 NOTE — Patient Instructions (Addendum)
   Dr Leontine Locket DENTIST  [facility]  [phone]     Make sure to eat 3 meals a day To SUPPLEMENT with ensure, boost, carnation breakfast or other protein supplement Make sure you are eating good protein in diet.

## 2019-10-13 ENCOUNTER — Other Ambulatory Visit: Payer: Self-pay | Admitting: Nurse Practitioner

## 2019-12-15 ENCOUNTER — Other Ambulatory Visit: Payer: Self-pay | Admitting: Nurse Practitioner

## 2019-12-19 ENCOUNTER — Encounter: Payer: Self-pay | Admitting: General Practice

## 2020-01-05 ENCOUNTER — Other Ambulatory Visit: Payer: Self-pay | Admitting: Nurse Practitioner

## 2020-01-08 ENCOUNTER — Other Ambulatory Visit: Payer: Self-pay | Admitting: Nurse Practitioner

## 2020-01-10 ENCOUNTER — Other Ambulatory Visit: Payer: Self-pay | Admitting: Nurse Practitioner

## 2020-02-02 ENCOUNTER — Ambulatory Visit: Payer: Medicare PPO | Admitting: Nurse Practitioner

## 2020-02-02 ENCOUNTER — Other Ambulatory Visit: Payer: Self-pay | Admitting: Nurse Practitioner

## 2020-02-02 DIAGNOSIS — I10 Essential (primary) hypertension: Secondary | ICD-10-CM

## 2020-02-03 ENCOUNTER — Ambulatory Visit: Payer: Medicare PPO | Admitting: Nurse Practitioner

## 2020-02-14 ENCOUNTER — Encounter: Payer: Self-pay | Admitting: Cardiovascular Disease

## 2020-02-14 ENCOUNTER — Ambulatory Visit: Payer: Medicare PPO | Admitting: Cardiovascular Disease

## 2020-02-14 VITALS — BP 100/60 | HR 74 | Ht 68.0 in | Wt 114.6 lb

## 2020-02-14 DIAGNOSIS — I10 Essential (primary) hypertension: Secondary | ICD-10-CM

## 2020-02-14 DIAGNOSIS — E785 Hyperlipidemia, unspecified: Secondary | ICD-10-CM | POA: Diagnosis not present

## 2020-02-14 DIAGNOSIS — I739 Peripheral vascular disease, unspecified: Secondary | ICD-10-CM

## 2020-02-14 DIAGNOSIS — I480 Paroxysmal atrial fibrillation: Secondary | ICD-10-CM

## 2020-02-14 DIAGNOSIS — Z720 Tobacco use: Secondary | ICD-10-CM

## 2020-02-14 NOTE — Progress Notes (Signed)
Cardiology Office Note   Date:  02/14/2020   ID:  [name], DOB [birth year], MRN [id number]  PCP:  [name], NP  Cardiologist: Dr. Radford Pax  No chief complaint on file.     History of Present Illness: [name] is a 79 y.o. male who Is here today for a follow-up visit regarding peripheral arterial disease. 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 He was not felt to be a good candidate for anticoagulation given history of heavy alcohol use. Echocardiogram in 2018 showed normal LV systolic function. He had a rash with diltiazem. He was seen in 2018 for a pressure ulcer on the left heel.  He underwent noninvasive vascular evaluation which showed an ABI of 0.52 on the left with significant left common and external iliac artery stenosis and an occluded left mid SFA. Angiography in May, 2018 showed moderate heavily calcified iliac disease bilaterally with short occlusion of the left distal SFA with three-vessel runoff below the knee. Attempted angioplasty of the left SFA was not successful due to inability to cross the occlusion. He was scheduled for a staged procedure via the retrograde anterior tibial artery access. However, he was hospitalized for pneumonia and pancytopenia. 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 Left heel ulceration healed without revascularization at that time.  Most recent lower extremity arterial Doppler in January 2020 showed an ABI of 0.62 on the right and 0.46 on the left.   He has been doing reasonably well overall with no chest pain, shortness of breath or palpitations. He continues to drink beer daily and continues to smoke. His appetite is not great and he lost 12 pounds since March.   Past Medical History:   Diagnosis Date  . Alcohol abuse   . Anemia   . Aneurysm ([place])   . CKD (chronic kidney disease), stage III ([place])   . COPD (chronic obstructive pulmonary disease) ([place])   . Dysrhythmia   . Fever of unknown origin 08/2016  . History of atrial fibrillation   . Hyperchloremia   . Hypercholesteremia   . Hyperpotassemia   . Hypertension   . Hypertensive renal disease, benign   . Leg edema   . PAD (peripheral artery disease) ([place])   . Pneumonia   . Pressure ulcer of left heel   . Seizures ([place])    has not had a seizure in 15 yrs  . Tobacco use   . Weight loss     Past Surgical History:  Procedure Laterality Date  . ABDOMINAL AORTOGRAM W/LOWER EXTREMITY N/A 08/13/2016   Procedure: Abdominal Aortogram w/Lower Extremity;  Surgeon: Wellington Hampshire, MD;  Location: [facility];  Service: Cardiovascular;  Laterality: N/A;  . 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, 1996  . EYE SURGERY     December 2017  . PERIPHERAL VASCULAR BALLOON ANGIOPLASTY  08/13/2016   Procedure: Peripheral Vascular Balloon Angioplasty;  Surgeon: Wellington Hampshire, MD;  Location: [facility];  Service: Cardiovascular;;  Aborted     Current Outpatient Medications  Medication Sig Dispense Refill  . albuterol (PROVENTIL HFA;VENTOLIN HFA) 108 (90 Base) MCG/ACT inhaler Inhale 2 puffs into the lungs every 6 (six) hours as needed for wheezing or shortness of breath. 1 Inhaler 2  . amLODipine (NORVASC) 10 MG tablet Take 1 tablet (  10 mg total) by mouth daily. 30 tablet 0  . aspirin EC 81 MG tablet Take 1 tablet (81 mg total) by mouth daily. 90 tablet 3  . atorvastatin (LIPITOR) 20 MG tablet TAKE 1 TABLET BY MOUTH EVERY DAY 90 tablet 0  . Carboxymethylcellulose Sodium (EYE DROPS OP) Apply 1 drop to eye daily as needed (dry eyes).    . carvedilol (COREG) 3.125 MG tablet TAKE 1 TABLET (3.125 MG TOTAL) BY MOUTH 2 (TWO) TIMES DAILY WITH A MEAL. 180 tablet 3  . Cyanocobalamin (VITAMIN B 12 PO) Take 1 tablet by mouth daily.     . divalproex (DEPAKOTE) 250 MG DR tablet TAKE 1 TABLET BY MOUTH THREE TIMES A DAY AS NEEDED FOR SEIZURES 270 tablet 1  . hydrALAZINE (APRESOLINE) 50 MG tablet TAKE 1 TABLET BY MOUTH THREE TIMES A DAY 270 tablet 1  . levETIRAcetam (KEPPRA) 500 MG tablet TAKE 1 TABLET BY MOUTH TWICE A DAY 180 tablet 1  . memantine (NAMENDA) 10 MG tablet TAKE 1 TABLET BY MOUTH TWICE A DAY 180 tablet 1  . Multiple Vitamins-Minerals (THEREMS-M) TABS TAKE 1 TABLET BY MOUTH EVERY DAY 90 tablet 3  . thiamine (CVS B-1) 100 MG tablet Take 1 tablet (100 mg total) by mouth daily. 30 tablet 3   No current facility-administered medications for this visit.    Allergies:   Diltiazem and Penicillins    Social History:  The patient  reports that he has been smoking cigarettes. He has smoked for the past 56.00 years. He has never used smokeless tobacco. He reports current alcohol use. He reports that he does not use drugs.   Family History:  The patient's family history includes Alzheimer's disease in his sister; Cancer in his mother; Clotting disorder in his sister and sister; Congestive Heart Failure in his brother, father, and sister.    ROS:  Please see the history of present illness.   Otherwise, review of systems are positive for none.   All other systems are reviewed and negative.    PHYSICAL EXAM: VS:  BP 100/60 (BP Location: Left Arm, Patient Position: Sitting)   Pulse 74   Ht '5\' 8"'  (1.727 m)   Wt 114 lb 9.6 oz (52 kg)   SpO2 95%   BMI 17.42 kg/m  , BMI Body mass index is 17.42 kg/m. GEN: Well nourished, well developed, in no acute distress  HEENT: normal  Neck: no JVD, carotid bruits, or masses Cardiac: RRR; no rubs  . 2/6 systolic ejection murmur at the aortic area  Respiratory:  clear to auscultation bilaterally, normal work of breathing GI: soft, nontender, nondistended, + BS MS: no deformity or atrophy  Skin: warm and dry, no rash Neuro:  Strength and sensation are intact Psych: euthymic mood,  full affect Vascular: Femoral pulses +1 bilaterally with bruits.  Dorsalis pedis is +1 on the right and not palpable on the left.  Posterior tibial is not palpable on both sides.  EKG:  EKG is ordered today. Sinus rhythm with minimal LVH and possible old septal infarct.    Recent Labs: 06/03/2019: ALT 23 10/03/2019: BUN 21; Creat 1.48; Hemoglobin 12.2; Platelets 120; Potassium 5.2; Sodium 140    Lipid Panel    Component Value Date/Time   CHOL 153 10/03/2019 1531   CHOL 206 (H) 07/13/2014 1130   TRIG 78 10/03/2019 1531   HDL 52 10/03/2019 1531   HDL 112 07/13/2014 1130   CHOLHDL 2.9 10/03/2019 1531   VLDL 10 04/15/2016 0211  LDLCALC 84 10/03/2019 1531      Wt Readings from Last 3 Encounters:  02/14/20 114 lb 9.6 oz (52 kg)  10/03/19 121 lb 11.2 oz (55.2 kg)  06/07/19 126 lb (57.2 kg)       No flowsheet data found.    ASSESSMENT AND PLAN:  1.  Peripheral arterial disease: The patient has evidence of severe peripheral arterial disease especially on the left side.  However, he denies claudication likely due to somewhat limited mobility related to his balance issues. He has no ulceration. Continue medical therapy.  2. Hyperlipidemia: Continue treatment with atorvastatin.  Most recent LDL was 91.  3.  Tobacco use: I discussed with him the importance of smoking cessation.  He reports that he cut down to only 2 cigarettes a day  4. Paroxysmal atrial fibrillation: Currently in regular rhythm.  He continues to be a poor candidate for anticoagulation due to continued alcohol use, thrombocytopenia and unsteady gait.  5.  Essential hypertension: His blood pressure is running low and I suspect this is likely due to poor appetite and weight loss. I elected to discontinue hydralazine. Continue carvedilol and amlodipine.    Disposition:   FU with me in 6 months   Signed,  Kathlyn Sacramento, MD  02/14/2020 10:32 AM    [place]

## 2020-02-14 NOTE — Patient Instructions (Signed)
Medication Instructions:  STOP the Hydralazine  *If you need a refill on your cardiac medications before your next appointment, please call your pharmacy*   Lab Work: None ordered If you have labs (blood work) drawn today and your tests are completely normal, you will receive your results only by: Marland Kitchen MyChart Message (if you have MyChart) OR . A paper copy in the mail If you have any lab test that is abnormal or we need to change your treatment, we will call you to review the results.   Testing/Procedures: None ordered   Follow-Up: At [facility], you and your health needs are our priority.  As part of our continuing mission to provide you with exceptional heart care, we have created designated Provider Care Teams.  These Care Teams include your primary Cardiologist (physician) and Advanced Practice Providers (APPs -  Physician Assistants and Nurse Practitioners) who all work together to provide you with the care you need, when you need it.  We recommend signing up for the patient portal called "MyChart".  Sign up information is provided on this After Visit Summary.  MyChart is used to connect with patients for Virtual Visits (Telemedicine).  Patients are able to view lab/test results, encounter notes, upcoming appointments, etc.  Non-urgent messages can be sent to your provider as well.   To learn more about what you can do with MyChart, go to [email or web address].    Your next appointment:   6 month(s)  The format for your next appointment:   In Person  Provider:   Kathlyn Sacramento, MD

## 2020-02-16 ENCOUNTER — Telehealth: Payer: Self-pay | Admitting: Cardiovascular Disease

## 2020-02-16 NOTE — Telephone Encounter (Signed)
° ° ° °  Mary called, she said she lost pt's AVS and she would like to know what medication Dr. Fletcher Anon took him off of, so she can remove it from pt's pill box

## 2020-02-16 NOTE — Telephone Encounter (Signed)
Spoke with pt's sister (emergency contact). Confirmed she found the correct paperwork and understands the AVS instructions. States she will get back in touch with our office with any further questions or concerns.

## 2020-02-16 NOTE — Telephone Encounter (Signed)
St[place] Kidney called back and said she found the AVS. She no longer needs a call from Dr. Tyrell Antonio Nurse

## 2020-02-20 ENCOUNTER — Encounter: Payer: Self-pay | Admitting: Nurse Practitioner

## 2020-02-20 ENCOUNTER — Ambulatory Visit (INDEPENDENT_AMBULATORY_CARE_PROVIDER_SITE_OTHER): Payer: Medicare PPO | Admitting: Nurse Practitioner

## 2020-02-20 ENCOUNTER — Other Ambulatory Visit: Payer: Self-pay

## 2020-02-20 VITALS — BP 122/78 | HR 52 | Temp 96.8°F | Ht 68.0 in | Wt 116.0 lb

## 2020-02-20 DIAGNOSIS — F0391 Unspecified dementia with behavioral disturbance: Secondary | ICD-10-CM

## 2020-02-20 DIAGNOSIS — J449 Chronic obstructive pulmonary disease, unspecified: Secondary | ICD-10-CM | POA: Diagnosis not present

## 2020-02-20 DIAGNOSIS — I480 Paroxysmal atrial fibrillation: Secondary | ICD-10-CM

## 2020-02-20 DIAGNOSIS — R569 Unspecified convulsions: Secondary | ICD-10-CM

## 2020-02-20 DIAGNOSIS — E440 Moderate protein-calorie malnutrition: Secondary | ICD-10-CM

## 2020-02-20 DIAGNOSIS — Z23 Encounter for immunization: Secondary | ICD-10-CM

## 2020-02-20 DIAGNOSIS — I10 Essential (primary) hypertension: Secondary | ICD-10-CM

## 2020-02-20 DIAGNOSIS — R5381 Other malaise: Secondary | ICD-10-CM

## 2020-02-20 DIAGNOSIS — I5032 Chronic diastolic (congestive) heart failure: Secondary | ICD-10-CM

## 2020-02-20 DIAGNOSIS — R634 Abnormal weight loss: Secondary | ICD-10-CM

## 2020-02-20 DIAGNOSIS — F17200 Nicotine dependence, unspecified, uncomplicated: Secondary | ICD-10-CM

## 2020-02-20 NOTE — Progress Notes (Signed)
Careteam: Patient Care Team: [name], NP as PCP - General (Nurse Practitioner)  PLACE OF SERVICE:  [place] Directive information Does Patient Have a Medical Advance Directive?: Yes, Type of Advance Directive: Out of facility DNR (pink MOST or yellow form), Pre-existing out of facility DNR order (yellow form or pink MOST form): Yellow form placed in chart (order not valid for inpatient use);Pink MOST form placed in chart (order not valid for inpatient use), Does patient want to make changes to medical advance directive?: No - Patient declined  Allergies  Allergen Reactions  . Diltiazem Rash and Other (See Comments)    Blisters  . Penicillins Other (See Comments)    Unknown Childhood allergy  Has patient had a PCN reaction causing immediate rash, facial/tongue/throat swelling, SOB or lightheadedness with hypotension: unknown Has patient had a PCN reaction causing severe rash involving mucus membranes or skin necrosis: unknown Has patient had a PCN reaction that required hospitalization unknown Has patient had a PCN reaction occurring within the last 10 years:no If all of the above answers are "NO", then may proceed with Cephalosporin use.     Chief Complaint  Patient presents with  . Medical Management of Chronic Issues    4 month follow-up. Flu vaccine today. Discuss need for TD/Tdap. Left leg concerns- change in color, texture change, and affecting gait. Here with sister Virl Cagey     HPI: Patient is a 79 y.o. male for routine follow up.     Dementia- sister reports needing placement, no one to care for him. Niece keeps his pill box filled. He lives with brother but he does not really help with care. Weight loss noted, not eating correctly. Liberalized diet. Sister tries to make sure he gets at least 1 ensure a day She has to help with laundry and appts.  Needs help bathing (does not get a bath routinely) Having a hard time getting him 3 meals a day.  He would not eat meals on wheels.  Sister has brought him freezer meals or put meals in the fridge but he will forget to to this on his own.  Sister having to call to make sure he goes into kitchen/fridge to get it.  Walking off, reports occasional pain in the leg. Balance off. He will stay in the bed most of the day and not eat.    Continues to drink beer and continues to smoke.    Complains of left leg hurting. Family notes weakness and slow gait. He is a poor historian. Reports pain mostly in the morning.   Review of Systems:  Review of Systems  Unable to perform ROS: Dementia    Past Medical History:  Diagnosis Date  . Alcohol abuse   . Anemia   . Aneurysm ([place])   . CKD (chronic kidney disease), stage III ([place])   . COPD (chronic obstructive pulmonary disease) ([place])   . Dysrhythmia   . Fever of unknown origin 08/2016  . History of atrial fibrillation   . Hyperchloremia   . Hypercholesteremia   . Hyperpotassemia   . Hypertension   . Hypertensive renal disease, benign   . Leg edema   . PAD (peripheral artery disease) ([place])   . Pneumonia   . Pressure ulcer of left heel   . Seizures ([place])    has not had a seizure in 15 yrs  . Tobacco use   . Weight loss    Past Surgical History:  Procedure Laterality  Date  . ABDOMINAL AORTOGRAM W/LOWER EXTREMITY N/A 08/13/2016   Procedure: Abdominal Aortogram w/Lower Extremity;  Surgeon: Wellington Hampshire, MD;  Location: [facility];  Service: Cardiovascular;  Laterality: N/A;  . 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, 1996  . EYE SURGERY     December 2017  . PERIPHERAL VASCULAR BALLOON ANGIOPLASTY  08/13/2016   Procedure: Peripheral Vascular Balloon Angioplasty;  Surgeon: Wellington Hampshire, MD;  Location: [facility];  Service: Cardiovascular;;  Aborted   Social History:   reports that he has been smoking cigarettes. He has smoked for the past 56.00 years. He has never used smokeless tobacco. He reports previous alcohol use. He  reports that he does not use drugs.  Family History  Problem Relation Age of Onset  . Cancer Mother   . Congestive Heart Failure Father   . Alzheimer's disease Sister   . Congestive Heart Failure Sister   . Clotting disorder Sister   . Congestive Heart Failure Brother   . Clotting disorder Sister     Medications: Patient's Medications  New Prescriptions   No medications on file  Previous Medications   ALBUTEROL (PROVENTIL HFA;VENTOLIN HFA) 108 (90 BASE) MCG/ACT INHALER    Inhale 2 puffs into the lungs every 6 (six) hours as needed for wheezing or shortness of breath.   AMLODIPINE (NORVASC) 10 MG TABLET    Take 1 tablet (10 mg total) by mouth daily.   ASPIRIN EC 81 MG TABLET    Take 1 tablet (81 mg total) by mouth daily.   ATORVASTATIN (LIPITOR) 20 MG TABLET    TAKE 1 TABLET BY MOUTH EVERY DAY   CARBOXYMETHYLCELLULOSE SODIUM (EYE DROPS OP)    Apply 1 drop to eye daily as needed (dry eyes).   CARVEDILOL (COREG) 3.125 MG TABLET    TAKE 1 TABLET (3.125 MG TOTAL) BY MOUTH 2 (TWO) TIMES DAILY WITH A MEAL.   CYANOCOBALAMIN (VITAMIN B 12 PO)    Take 1 tablet by mouth daily.   DIVALPROEX (DEPAKOTE) 250 MG DR TABLET    TAKE 1 TABLET BY MOUTH THREE TIMES A DAY AS NEEDED FOR SEIZURES   LEVETIRACETAM (KEPPRA) 500 MG TABLET    TAKE 1 TABLET BY MOUTH TWICE A DAY   MEMANTINE (NAMENDA) 10 MG TABLET    TAKE 1 TABLET BY MOUTH TWICE A DAY   MULTIPLE VITAMINS-MINERALS (THEREMS-M) TABS    TAKE 1 TABLET BY MOUTH EVERY DAY   THIAMINE ([facility] B-1) 100 MG TABLET    Take 1 tablet (100 mg total) by mouth daily.  Modified Medications   No medications on file  Discontinued Medications   No medications on file    Physical Exam:  Vitals:   02/20/20 1128  BP: 122/78  Pulse: (!) 52  Temp: (!) 96.8 F (36 C)  TempSrc: Temporal  SpO2: 99%  Weight: 116 lb (52.6 kg)  Height: 5\' 8"  (1.727 m)   Body mass index is 17.64 kg/m. Wt Readings from Last 3 Encounters:  02/20/20 116 lb (52.6 kg)  02/14/20 114 lb  9.6 oz (52 kg)  10/03/19 121 lb 11.2 oz (55.2 kg)    Physical Exam Constitutional:      General: He is not in acute distress.    Appearance: He is underweight. He is not diaphoretic.  HENT:     Head: Normocephalic and atraumatic.     Mouth/Throat:     Pharynx: No oropharyngeal exudate.  Eyes:     Conjunctiva/sclera: Conjunctivae normal.  Pupils: Pupils are equal, round, and reactive to light.  Cardiovascular:     Rate and Rhythm: Normal rate and regular rhythm.     Heart sounds: Normal heart sounds.  Pulmonary:     Effort: Pulmonary effort is normal.     Breath sounds: Normal breath sounds.  Abdominal:     General: Bowel sounds are normal.     Palpations: Abdomen is soft.  Musculoskeletal:        General: No tenderness.     Cervical back: Normal range of motion and neck supple.     Right lower leg: No edema.     Left lower leg: No edema.     Comments: Bilateral LE skin changes consistent with venous insufficiency, known PAD  Skin:    General: Skin is warm and dry.  Neurological:     Mental Status: He is alert. Mental status is at baseline.     Coordination: Coordination abnormal.     Gait: Gait abnormal (uses cane).  Psychiatric:        Mood and Affect: Mood normal.        Behavior: Behavior normal.     Labs reviewed: Basic Metabolic Panel: Recent Labs    06/03/19 1517 10/03/19 1531  NA 143 140  K 4.6 5.2  CL 110 109  CO2 25 26  GLUCOSE 118* 100  BUN 23 21  CREATININE 1.19* 1.48*  CALCIUM 8.6 8.6   Liver Function Tests: Recent Labs    06/03/19 1517  AST 26  ALT 23  BILITOT 0.3  PROT 6.3   No results for input(s): LIPASE, AMYLASE in the last 8760 hours. No results for input(s): AMMONIA in the last 8760 hours. CBC: Recent Labs    06/03/19 1517 10/03/19 1531  WBC 5.3 5.3  NEUTROABS 2,173 2,003  HGB 11.6* 12.2*  HCT 35.4* 36.5*  MCV 93.2 91.3  PLT 166 120*   Lipid Panel: Recent Labs    10/03/19 1531  CHOL 153  HDL 52  LDLCALC 84   TRIG 78  CHOLHDL 2.9                  TSH: No results for input(s): TSH in the last 8760 hours. A1C: Lab Results  Component Value Date   HGBA1C 5.2 04/15/2016     Assessment/Plan 1. Need for influenza vaccination - Flu Vaccine QUAD High Dose(Fluad)  2. COPD, group B, by GOLD 2017 classification ([place]) Stable, continues to smoke, encourage cessation. - Ambulatory referral to [facility] [facility]  3. Chronic diastolic heart failure (HCC) Stable without progressive symptoms. No edema, shortness of breath, chest pain noted. - Ambulatory referral to [facility]  4. Dementia with behavioral disturbance, unspecified dementia type ([place]) Progressive decline. Needing more care than provided in home. Sister and niece trying to help with care however they do not live with pt. There is mixed family involvement that has caused difficulty in providing the best care and supervision for the patient. pts niece who is POA is trying to get things sorted out to make sure Mr Holts needs are taken care of. - Ambulatory referral to [facility] - CBC with Differential/Platelet - AMB Referral to [facility]  5. Debility -progressive decline due to progression of dementia and lack of interest. High risk of falls - Ambulatory referral to [place] - AMB Referral to [facility]  6. Paroxysmal atrial fibrillation (HCC) Not on anticoagulation due to high  fall risk -rate controlled at this time.  - Ambulatory referral to [facility] - CBC with Differential/Platelet  7. Tobacco use disorder -encouraged cessation  8. Weight loss Ongoing weight loss, family attempts to help with meals but do not live with pt, he lives with brother who does not assist him with care or meals. Expect this to worsen with progression of dementia. Family looking for placement/increase care at this time.  - TSH - COMPLETE METABOLIC PANEL  WITH GFR - CBC with Differential/Platelet - AMB Referral to [facility]  9. Essential hypertension, benign Controlled on norvasc and coreg.   10. Malnutrition of moderate degree -ongoing, family continues to encourage him to eat and provides meals and supplements.  - AMB Referral to [facility]  11. Seizures ([place]) -no recent seizures, continues on depakote TID and keppra BID  Next appt: 3 months, sooner if needed  Emonni Depasquale K. [place], [facility] [phone]

## 2020-02-21 ENCOUNTER — Other Ambulatory Visit: Payer: Self-pay

## 2020-02-21 DIAGNOSIS — E875 Hyperkalemia: Secondary | ICD-10-CM

## 2020-02-22 LAB — TEST AUTHORIZATION

## 2020-02-22 LAB — COMPLETE METABOLIC PANEL WITH GFR
AG Ratio: 1.1 (calc) (ref 1.0–2.5)
ALT: 7 U/L — ABNORMAL LOW (ref 9–46)
AST: 16 U/L (ref 10–35)
Albumin: 3.3 g/dL — ABNORMAL LOW (ref 3.6–5.1)
Alkaline phosphatase (APISO): 63 U/L (ref 35–144)
BUN/Creatinine Ratio: 23 (calc) — ABNORMAL HIGH (ref 6–22)
BUN: 28 mg/dL — ABNORMAL HIGH (ref 7–25)
CO2: 26 mmol/L (ref 20–32)
Calcium: 8.5 mg/dL — ABNORMAL LOW (ref 8.6–10.3)
Chloride: 109 mmol/L (ref 98–110)
Creat: 1.24 mg/dL — ABNORMAL HIGH (ref 0.70–1.11)
GFR, Est African American: 63 mL/min/{1.73_m2} (ref >=60)
GFR, Est Non African American: 54 mL/min/{1.73_m2} — ABNORMAL LOW (ref >=60)
Globulin: 3.1 g/dL (calc) (ref 1.9–3.7)
Glucose, Bld: 87 mg/dL (ref 65–99)
Potassium: 5.6 mmol/L — ABNORMAL HIGH (ref 3.5–5.3)
Sodium: 141 mmol/L (ref 135–146)
Total Bilirubin: 0.2 mg/dL (ref 0.2–1.2)
Total Protein: 6.4 g/dL (ref 6.1–8.1)

## 2020-02-22 LAB — CBC WITH DIFFERENTIAL/PLATELET
Absolute Monocytes: 794 cells/uL (ref 200–950)
Basophils Absolute: 38 cells/uL (ref 0–200)
Basophils Relative: 0.6 %
Eosinophils Absolute: 88 cells/uL (ref 15–500)
Eosinophils Relative: 1.4 %
HCT: 34.6 % — ABNORMAL LOW (ref 38.5–50.0)
Hemoglobin: 11 g/dL — ABNORMAL LOW (ref 13.2–17.1)
Lymphs Abs: 2514 cells/uL (ref 850–3900)
MCH: 29.5 pg (ref 27.0–33.0)
MCHC: 31.8 g/dL — ABNORMAL LOW (ref 32.0–36.0)
MCV: 92.8 fL (ref 80.0–100.0)
MPV: 11.8 fL (ref 7.5–12.5)
Monocytes Relative: 12.6 %
Neutro Abs: 2867 cells/uL (ref 1500–7800)
Neutrophils Relative %: 45.5 %
Platelets: 137 10*3/uL — ABNORMAL LOW (ref 140–400)
RBC: 3.73 10*6/uL — ABNORMAL LOW (ref 4.20–5.80)
RDW: 12.5 % (ref 11.0–15.0)
Total Lymphocyte: 39.9 %
WBC: 6.3 10*3/uL (ref 3.8–10.8)

## 2020-02-22 LAB — IRON,TIBC AND FERRITIN PANEL
%SAT: 25 % (calc) (ref 20–48)
Ferritin: 173 ng/mL (ref 24–380)
Iron: 61 ug/dL (ref 50–180)
TIBC: 243 mcg/dL (calc) — ABNORMAL LOW (ref 250–425)

## 2020-02-22 LAB — TSH: TSH: 1.1 mIU/L (ref 0.40–4.50)

## 2020-02-27 ENCOUNTER — Other Ambulatory Visit: Payer: Medicare PPO

## 2020-02-27 ENCOUNTER — Other Ambulatory Visit: Payer: Self-pay

## 2020-02-27 DIAGNOSIS — E875 Hyperkalemia: Secondary | ICD-10-CM

## 2020-02-28 ENCOUNTER — Other Ambulatory Visit: Payer: Self-pay

## 2020-02-28 ENCOUNTER — Other Ambulatory Visit: Payer: Self-pay | Admitting: Adult Health

## 2020-02-28 ENCOUNTER — Telehealth: Payer: Self-pay

## 2020-02-28 ENCOUNTER — Telehealth: Payer: Self-pay | Admitting: Adult Health

## 2020-02-28 DIAGNOSIS — E875 Hyperkalemia: Secondary | ICD-10-CM

## 2020-02-28 LAB — BASIC METABOLIC PANEL WITH GFR
BUN/Creatinine Ratio: 15 (calc) (ref 6–22)
BUN: 21 mg/dL (ref 7–25)
CO2: 25 mmol/L (ref 20–32)
Calcium: 8.9 mg/dL (ref 8.6–10.3)
Chloride: 110 mmol/L (ref 98–110)
Creat: 1.38 mg/dL — ABNORMAL HIGH (ref 0.70–1.11)
GFR, Est African American: 55 mL/min/{1.73_m2} — ABNORMAL LOW (ref >=60)
GFR, Est Non African American: 48 mL/min/{1.73_m2} — ABNORMAL LOW (ref >=60)
Glucose, Bld: 77 mg/dL (ref 65–99)
Potassium: 6.3 mmol/L (ref 3.5–5.3)
Sodium: 143 mmol/L (ref 135–146)

## 2020-02-28 MED ORDER — SODIUM POLYSTYRENE SULFONATE 15 GM/60ML PO SUSP: ORAL | 0 refills | Status: DC

## 2020-02-28 MED ORDER — SODIUM POLYSTYRENE SULFONATE 15 GM/60ML PO SUSP: 15.0000 g | Freq: Once | ORAL | 0 refills | Status: DC

## 2020-02-28 NOTE — Telephone Encounter (Signed)
[name]  is going to pick up prescription as soon as possible. The Pharmacy did have the right dosage amount per [name] NP and she also sent in an extra dose. Patient is scheduled for lab tomorrow morning.

## 2020-02-28 NOTE — Telephone Encounter (Signed)
This is a suspension so this should be a liquid

## 2020-02-28 NOTE — Telephone Encounter (Signed)
[facility] called and stated the the Sodium ploystyrene (keyexalate 15GM/90ml suspension does not come in this form, it only come in a big jug powder form  400 grams. The amount of the order is 4 leveled teaspoons that they do no do. Please advise as the patients caregiver is awaiting to pick it up.

## 2020-02-28 NOTE — Telephone Encounter (Signed)
Message from  [name] NP :  I called the pharmacy and they have the right medication, im actually sending in more incase in needs another dose so they wont have to pick up more, so if someone could call them back and say they have it and to make sure to only take 60 ML today and theres an extra dose if needed.     I called Mrs. [name] and she stated she will go pick up Medication as soon as possible .

## 2020-02-28 NOTE — Telephone Encounter (Signed)
Called POA [place] and left a VM to pick up prescription at pts local pharmacy (due to elevated potassium- critical lab was called to the oncall of a potassium of 6.3) and to call the office to schedule follow up labs for 11/24 AM, these are to be run stat. (orders have already been placed)  Please re-try [facility] this morning if she does not call back. You can also call and speak with Stanton Kidney she helps with Mr [facility] care.   Please give pt handouts for hyperkalemia in office. Increase hydration with water. To avoid ensure or electrolyte waters at this time as they are high in potassium.

## 2020-02-28 NOTE — Telephone Encounter (Signed)
Sent a dose of Kayexalate 15gm[id number]. Will need BMP redrawn tomorrow, 02/29/20.

## 2020-02-28 NOTE — Telephone Encounter (Signed)
Diagnostic lab called regarding critical K 6.3.

## 2020-02-28 NOTE — Addendum Note (Signed)
Addended by: [name] on: 02/28/2020 06:51 AM   Modules accepted: Orders

## 2020-02-28 NOTE — Addendum Note (Signed)
Addended by: [name] on: 02/28/2020 11:22 AM   Modules accepted: Orders

## 2020-02-28 NOTE — Telephone Encounter (Signed)
Called and spoke to "Currie Paris" she states that she's currently on the phone with patient sister. Hollace Hayward states that she received the voicemail and was informing patient sister of everything. Patient is scheduled for labs tomorrow 02/29/2020 at 9:15am.

## 2020-02-28 NOTE — Telephone Encounter (Signed)
Late entry, [facility] called at 1:30 pm today to get a clarification on how patient is to take rx.  I called [name], NP via cell phone with Hollace Hayward on the office phone to clarify. Instructions states: to 60 mL today and the dispense was for 120 mL.  Hollace Hayward is questioning if they are to take the whole bottle since it said to.  Per [name], NP that was an error and patient to take 60 mL today and recheck potassium tomorrow and if still abnormal we will advise.

## 2020-02-29 ENCOUNTER — Other Ambulatory Visit: Payer: Self-pay | Admitting: Nurse Practitioner

## 2020-02-29 ENCOUNTER — Other Ambulatory Visit: Payer: Self-pay

## 2020-02-29 ENCOUNTER — Other Ambulatory Visit: Payer: Medicare PPO

## 2020-02-29 DIAGNOSIS — E875 Hyperkalemia: Secondary | ICD-10-CM

## 2020-02-29 LAB — BASIC METABOLIC PANEL WITH GFR
BUN/Creatinine Ratio: 14 (calc) (ref 6–22)
BUN: 19 mg/dL (ref 7–25)
CO2: 26 mmol/L (ref 20–32)
Calcium: 8.7 mg/dL (ref 8.6–10.3)
Chloride: 109 mmol/L (ref 98–110)
Creat: 1.33 mg/dL — ABNORMAL HIGH (ref 0.70–1.11)
GFR, Est African American: 58 mL/min/{1.73_m2} — ABNORMAL LOW (ref >=60)
GFR, Est Non African American: 50 mL/min/{1.73_m2} — ABNORMAL LOW (ref >=60)
Glucose, Bld: 73 mg/dL (ref 65–99)
Potassium: 6.1 mmol/L — ABNORMAL HIGH (ref 3.5–5.3)
Sodium: 139 mmol/L (ref 135–146)

## 2020-02-29 MED ORDER — SODIUM POLYSTYRENE SULFONATE 15 GM/60ML PO SUSP: ORAL | 0 refills | Status: DC

## 2020-02-29 NOTE — Telephone Encounter (Signed)
[name] was told the correct dosage and a phone note was made with a copy of what [name]: Per [name]   "  I called the pharmacy and they have the right medication, im actually sending in more incase in needs another dose so they wont have to pick up more, so if someone could call them back and say they have it and to make sure to only take 60 ML today and theres an extra dose if needed "

## 2020-03-05 ENCOUNTER — Other Ambulatory Visit: Payer: Medicare PPO

## 2020-03-05 ENCOUNTER — Other Ambulatory Visit: Payer: Self-pay

## 2020-03-05 ENCOUNTER — Other Ambulatory Visit: Payer: Self-pay | Admitting: Nurse Practitioner

## 2020-03-05 DIAGNOSIS — E875 Hyperkalemia: Secondary | ICD-10-CM

## 2020-03-05 DIAGNOSIS — N1832 Chronic kidney disease, stage 3b: Secondary | ICD-10-CM

## 2020-03-05 LAB — BASIC METABOLIC PANEL WITH GFR
BUN/Creatinine Ratio: 12 (calc) (ref 6–22)
BUN: 19 mg/dL (ref 7–25)
CO2: 24 mmol/L (ref 20–32)
Calcium: 8.8 mg/dL (ref 8.6–10.3)
Chloride: 106 mmol/L (ref 98–110)
Creat: 1.54 mg/dL — ABNORMAL HIGH (ref 0.70–1.11)
GFR, Est African American: 48 mL/min/{1.73_m2} — ABNORMAL LOW (ref >=60)
GFR, Est Non African American: 42 mL/min/{1.73_m2} — ABNORMAL LOW (ref >=60)
Glucose, Bld: 89 mg/dL (ref 65–139)
Potassium: 5.7 mmol/L — ABNORMAL HIGH (ref 3.5–5.3)
Sodium: 136 mmol/L (ref 135–146)

## 2020-03-05 MED ORDER — LOKELMA 10 G PO PACK: 10.0000 g | PACK | Freq: Every day | ORAL | 3 refills | Status: AC

## 2020-03-05 NOTE — Progress Notes (Signed)
Pt potassium remains high after kayexalate with worsening renal function.  Recommend evaluation by nephrologist at this time as the kidneys are not properly functioning and removing potassium.  Have also sent a prescription to the pharmacy and left samples upfront for him to start lokelma 10 gm daily- this is 1 pack mixed into water to help his potassium lower into normal range.  Would like someone to pick up this today and start - to have him come back to office on Thursday or Friday morning for another lab to recheck on his potassium.

## 2020-03-06 ENCOUNTER — Telehealth: Payer: Self-pay

## 2020-03-06 NOTE — Telephone Encounter (Signed)
Message given to [facility]

## 2020-03-06 NOTE — Telephone Encounter (Signed)
Willis came in today to pick up samples that you gave her for Mr. Mackel for his potassium and would like to know how often do you want him to take the medication Lokelma. Each packet is 10 g. Please advise

## 2020-03-06 NOTE — Telephone Encounter (Signed)
They are to give 1 packet daily-- there are instructions for administration on each packet.

## 2020-03-09 ENCOUNTER — Other Ambulatory Visit: Payer: Medicare PPO

## 2020-03-09 ENCOUNTER — Other Ambulatory Visit: Payer: Self-pay

## 2020-03-09 DIAGNOSIS — E875 Hyperkalemia: Secondary | ICD-10-CM

## 2020-03-09 LAB — BASIC METABOLIC PANEL WITH GFR
BUN/Creatinine Ratio: 19 (calc) (ref 6–22)
BUN: 23 mg/dL (ref 7–25)
CO2: 30 mmol/L (ref 20–32)
Calcium: 8 mg/dL — ABNORMAL LOW (ref 8.6–10.3)
Chloride: 108 mmol/L (ref 98–110)
Creat: 1.18 mg/dL — ABNORMAL HIGH (ref 0.70–1.11)
GFR, Est African American: 67 mL/min/{1.73_m2} (ref >=60)
GFR, Est Non African American: 58 mL/min/{1.73_m2} — ABNORMAL LOW (ref >=60)
Glucose, Bld: 78 mg/dL (ref 65–99)
Potassium: 4.9 mmol/L (ref 3.5–5.3)
Sodium: 142 mmol/L (ref 135–146)

## 2020-03-13 ENCOUNTER — Other Ambulatory Visit: Payer: Self-pay | Admitting: Nurse Practitioner

## 2020-03-28 ENCOUNTER — Other Ambulatory Visit: Payer: Self-pay | Admitting: Nephrology

## 2020-03-28 DIAGNOSIS — N1831 Chronic kidney disease, stage 3a: Secondary | ICD-10-CM

## 2020-04-06 ENCOUNTER — Other Ambulatory Visit: Payer: Self-pay | Admitting: Nurse Practitioner

## 2020-04-12 ENCOUNTER — Other Ambulatory Visit: Payer: Self-pay | Admitting: Nurse Practitioner

## 2020-04-12 DIAGNOSIS — R5381 Other malaise: Secondary | ICD-10-CM

## 2020-05-01 ENCOUNTER — Ambulatory Visit
Admission: RE | Admit: 2020-05-01 | Discharge: 2020-05-01 | Disposition: A | Discharge status: Home / Self Care | Payer: Medicare PPO | Source: Ambulatory Visit | Attending: Nephrology | Admitting: Nephrology

## 2020-05-01 DIAGNOSIS — N1831 Chronic kidney disease, stage 3a: Secondary | ICD-10-CM

## 2020-05-21 ENCOUNTER — Ambulatory Visit: Payer: Medicare PPO | Admitting: Nurse Practitioner

## 2020-06-26 ENCOUNTER — Other Ambulatory Visit: Payer: Self-pay | Admitting: Nurse Practitioner

## 2020-06-27 ENCOUNTER — Other Ambulatory Visit: Payer: Self-pay | Admitting: Nurse Practitioner

## 2020-06-30 ENCOUNTER — Other Ambulatory Visit: Payer: Self-pay | Admitting: Nurse Practitioner

## 2020-06-30 DIAGNOSIS — I480 Paroxysmal atrial fibrillation: Secondary | ICD-10-CM

## 2020-07-13 ENCOUNTER — Other Ambulatory Visit: Payer: Self-pay

## 2020-07-13 ENCOUNTER — Ambulatory Visit (INDEPENDENT_AMBULATORY_CARE_PROVIDER_SITE_OTHER): Payer: Medicare PPO | Admitting: Podiatry

## 2020-07-13 DIAGNOSIS — M79675 Pain in left toe(s): Secondary | ICD-10-CM

## 2020-07-13 DIAGNOSIS — B351 Tinea unguium: Secondary | ICD-10-CM | POA: Diagnosis not present

## 2020-07-13 DIAGNOSIS — I739 Peripheral vascular disease, unspecified: Secondary | ICD-10-CM

## 2020-07-13 DIAGNOSIS — M79674 Pain in right toe(s): Secondary | ICD-10-CM

## 2020-07-17 ENCOUNTER — Encounter: Payer: Self-pay | Admitting: Podiatry

## 2020-07-17 NOTE — Progress Notes (Signed)
  Subjective:  Patient ID: [name], male    DOB: [birth year],  MRN: [id number]  Chief Complaint  Patient presents with  . Nail Problem    Nail trim  Left hallux nail is painful    80 y.o. male returns for the above complaint.  Patient presents with complaint of thickened elongated mycotic dystrophic toenails x10.  Patient states that this is progressive gotten worse.  He has hard time ambulating because they are mildly painful.  He has tried self debridement but has not helped much.  He denies any other acute complaints.  He would like to have them debrided down.  Patient has history of peripheral vascular disease.  Objective:  There were no vitals filed for this visit. Podiatric Exam: Vascular: dorsalis pedis and posterior tibial pulses are faintly palpable bilateral. Capillary return is sluggish temperature gradient is WNL. Skin turgor WNL  Sensorium: Normal Semmes Weinstein monofilament test. Normal tactile sensation bilaterally. Nail Exam: Pt has thick disfigured discolored nails with subungual debris noted bilateral entire nail hallux through fifth toenails Ulcer Exam: There is no evidence of ulcer or pre-ulcerative changes or infection. Orthopedic Exam: Muscle tone and strength are WNL. No limitations in general ROM. No crepitus or effusions noted. HAV  B/L.  Hammer toes 2-5  B/L. Skin: No Porokeratosis. No infection or ulcers  Assessment & Plan:  Patient was evaluated and treated and all questions answered.  Onychomycosis with pain  -Nails palliatively debrided as below. -Educated on self-care  Procedure: Nail Debridement Rationale: pain  Type of Debridement: manual, sharp debridement. Instrumentation: Nail nipper, rotary burr. Number of Nails: 10  Procedures and Treatment: Consent by patient was obtained for treatment procedures. The patient understood the discussion of treatment and procedures well. All questions were answered thoroughly reviewed. Debridement of mycotic  and hypertrophic toenails, 1 through 5 bilateral and clearing of subungual debris. No ulceration, no infection noted.  Return Visit-Office Procedure: Patient instructed to return to the office for a follow up visit 3 months for continued evaluation and treatment.  Boneta Lucks, DPM    Return in about 3 months (around 10/12/2020).

## 2020-08-21 ENCOUNTER — Ambulatory Visit: Payer: Medicare PPO | Admitting: Cardiovascular Disease

## 2020-08-21 ENCOUNTER — Other Ambulatory Visit: Payer: Self-pay

## 2020-08-21 ENCOUNTER — Encounter: Payer: Self-pay | Admitting: Cardiovascular Disease

## 2020-08-21 VITALS — BP 225/92 | HR 49 | Ht 68.0 in | Wt 132.2 lb

## 2020-08-21 DIAGNOSIS — E785 Hyperlipidemia, unspecified: Secondary | ICD-10-CM

## 2020-08-21 DIAGNOSIS — I480 Paroxysmal atrial fibrillation: Secondary | ICD-10-CM | POA: Diagnosis not present

## 2020-08-21 DIAGNOSIS — I10 Essential (primary) hypertension: Secondary | ICD-10-CM

## 2020-08-21 DIAGNOSIS — I739 Peripheral vascular disease, unspecified: Secondary | ICD-10-CM

## 2020-08-21 MED ORDER — HYDRALAZINE HCL 50 MG PO TABS: 50.0000 mg | ORAL_TABLET | Freq: Three times a day (TID) | ORAL | 1 refills | Status: DC

## 2020-08-21 NOTE — Progress Notes (Signed)
Cardiology Office Note   Date:  08/21/2020   ID:  [name], DOB [birth year], MRN [id number]  PCP:  [name], NP  Cardiologist: Dr. Radford Pax  No chief complaint on file.     History of Present Illness: [name] is a 80 y.o. male who Is here today for a follow-up visit regarding peripheral arterial disease. 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 He was not felt to be a good candidate for anticoagulation given history of heavy alcohol use. Echocardiogram in 2018 showed normal LV systolic function. He had a rash with diltiazem. He was seen in 2018 for a pressure ulcer on the left heel.  He underwent noninvasive vascular evaluation which showed an ABI of 0.52 on the left with significant left common and external iliac artery stenosis and an occluded left mid SFA. Angiography in May, 2018 showed moderate heavily calcified iliac disease bilaterally with short occlusion of the left distal SFA with three-vessel runoff below the knee. Attempted angioplasty of the left SFA was not successful due to inability to cross the occlusion. He was scheduled for a staged procedure via the retrograde anterior tibial artery access. However, he was hospitalized for pneumonia and pancytopenia. 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 Left heel ulceration healed without revascularization at that time.  Most recent lower extremity arterial Doppler in January 2020 showed an ABI of 0.62 on the right and 0.46 on the left.   During last visit, he was losing weight and his blood pressure was on the low side.  Thus, I discontinued hydralazine.  He gained his appetite back and weight is back to baseline.  He is hypertensive today.  He has been having issues with  hyperkalemia. No chest pain or shortness of breath.  No leg claudication.   Past Medical History:  Diagnosis Date  . Alcohol abuse   . Anemia   . Aneurysm ([place])   . CKD (chronic kidney disease), stage III ([place])   . COPD (chronic obstructive pulmonary disease) ([place])   . Dysrhythmia   . Fever of unknown origin 08/2016  . History of atrial fibrillation   . Hyperchloremia   . Hypercholesteremia   . Hyperpotassemia   . Hypertension   . Hypertensive renal disease, benign   . Leg edema   . PAD (peripheral artery disease) ([place])   . Pneumonia   . Pressure ulcer of left heel   . Seizures ([place])    has not had a seizure in 15 yrs  . Tobacco use   . Weight loss     Past Surgical History:  Procedure Laterality Date  . ABDOMINAL AORTOGRAM W/LOWER EXTREMITY N/A 08/13/2016   Procedure: Abdominal Aortogram w/Lower Extremity;  Surgeon: Wellington Hampshire, MD;  Location: [facility];  Service: Cardiovascular;  Laterality: N/A;  . 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, 1996  . EYE SURGERY     December 2017  . PERIPHERAL VASCULAR BALLOON ANGIOPLASTY  08/13/2016   Procedure: Peripheral Vascular Balloon Angioplasty;  Surgeon: Wellington Hampshire, MD;  Location: [facility];  Service: Cardiovascular;;  Aborted     Current Outpatient Medications  Medication Sig Dispense Refill  . albuterol (PROVENTIL HFA;VENTOLIN HFA) 108 (90 Base) MCG/ACT inhaler Inhale 2 puffs into the lungs every 6 (six) hours as needed  for wheezing or shortness of breath. 1 Inhaler 2  . amLODipine (NORVASC) 10 MG tablet Take 1 tablet (10 mg total) by mouth daily. 30 tablet 0  . aspirin EC 81 MG tablet Take 1 tablet (81 mg total) by mouth daily. 90 tablet 3  . atorvastatin (LIPITOR) 20 MG tablet TAKE 1 TABLET BY MOUTH EVERY DAY 90 tablet 1  . Carboxymethylcellulose Sodium (EYE DROPS OP) Apply 1 drop to eye daily as needed (dry eyes).    . carvedilol (COREG) 3.125 MG tablet Take 1 tablet (3.125 mg total) by mouth 2 (two)  times daily with a meal. I48.0 180 tablet 3  . Cyanocobalamin (VITAMIN B 12 PO) Take 1 tablet by mouth daily.    . divalproex (DEPAKOTE) 250 MG DR tablet TAKE 1 TABLET BY MOUTH THREE TIMES A DAY AS NEEDED FOR SEIZURES 270 tablet 1  . levETIRAcetam (KEPPRA) 500 MG tablet TAKE 1 TABLET BY MOUTH TWICE A DAY 180 tablet 1  . memantine (NAMENDA) 10 MG tablet TAKE 1 TABLET BY MOUTH TWICE A DAY 180 tablet 1  . Multiple Vitamins-Minerals (THEREMS-M) TABS TAKE 1 TABLET BY MOUTH EVERY DAY 90 tablet 3  . sodium zirconium cyclosilicate (LOKELMA) 10 g PACK packet Take 10 g by mouth daily. 30 packet 3  . thiamine (CVS B-1) 100 MG tablet Take 1 tablet (100 mg total) by mouth daily. 30 tablet 3   No current facility-administered medications for this visit.    Allergies:   Diltiazem and Penicillins    Social History:  The patient  reports that he has been smoking cigarettes. He has smoked for the past 56.00 years. He has never used smokeless tobacco. He reports previous alcohol use. He reports that he does not use drugs.   Family History:  The patient's family history includes Alzheimer's disease in his sister; Cancer in his mother; Clotting disorder in his sister and sister; Congestive Heart Failure in his brother, father, and sister.    ROS:  Please see the history of present illness.   Otherwise, review of systems are positive for none.   All other systems are reviewed and negative.    PHYSICAL EXAM: VS:  BP (!) 225/92   Pulse (!) 49   Ht '5\' 8"'  (1.727 m)   Wt 132 lb 3.2 oz (60 kg)   SpO2 100%   BMI 20.10 kg/m  , BMI Body mass index is 20.1 kg/m. GEN: Well nourished, well developed, in no acute distress  HEENT: normal  Neck: no JVD, carotid bruits, or masses Cardiac: RRR; no rubs  . 2/6 systolic ejection murmur at the aortic area  Respiratory:  clear to auscultation bilaterally, normal work of breathing GI: soft, nontender, nondistended, + BS MS: no deformity or atrophy  Skin: warm and dry, no  rash Neuro:  Strength and sensation are intact Psych: euthymic mood, full affect Vascular: Femoral pulses +1 bilaterally with bruits.  Dorsalis pedis is +1 on the right and not palpable on the left.  Posterior tibial is not palpable on both sides.  EKG:  EKG is ordered today. Sinus bradycardia with left atrial enlargement, left axis deviation, LVH with repolarization abnormalities.    Recent Labs: 02/20/2020: ALT 7; Hemoglobin 11.0; Platelets 137; TSH 1.10 03/09/2020: BUN 23; Creat 1.18; Potassium 4.9; Sodium 142    Lipid Panel    Component Value Date/Time   CHOL 153 10/03/2019 1531   CHOL 206 (H) 07/13/2014 1130   TRIG 78 10/03/2019 1531   HDL 52 10/03/2019  1531   HDL 112 07/13/2014 1130   CHOLHDL 2.9 10/03/2019 1531   VLDL 10 04/15/2016 0211   LDLCALC 84 10/03/2019 1531      Wt Readings from Last 3 Encounters:  08/21/20 132 lb 3.2 oz (60 kg)  02/20/20 116 lb (52.6 kg)  02/14/20 114 lb 9.6 oz (52 kg)       No flowsheet data found.    ASSESSMENT AND PLAN:  1.  Peripheral arterial disease: The patient has evidence of severe peripheral arterial disease especially on the left side.  However, he denies claudication likely due to somewhat limited mobility related to his balance issues. He has no ulceration. Continue medical therapy.  2. Hyperlipidemia: Continue treatment with atorvastatin.  Most recent LDL was 91.  3.  Tobacco use: I discussed with him the importance of smoking cessation.  He reports that he cut down to only 2 cigarettes a day  4. Paroxysmal atrial fibrillation: Currently in regular rhythm.  He continues to be a poor candidate for anticoagulation due to continued alcohol use, thrombocytopenia and unsteady gait.  5.  Essential hypertension: Blood pressure is severely elevated.  I elected to resume hydralazine.  We provided him with low potassium diet instructions given issues with hyperkalemia.    Disposition:   FU with me in 6 months    Signed,  Kathlyn Sacramento, MD  08/21/2020 11:01 AM    [place]

## 2020-08-21 NOTE — Patient Instructions (Signed)
Medication Instructions:  START Hydralazine 50 mg three times daily  *If you need a refill on your cardiac medications before your next appointment, please call your pharmacy*   Lab Work: None ordered If you have labs (blood work) drawn today and your tests are completely normal, you will receive your results only by: Marland Kitchen MyChart Message (if you have MyChart) OR . A paper copy in the mail If you have any lab test that is abnormal or we need to change your treatment, we will call you to review the results.   Testing/Procedures: None ordered   Follow-Up: At [facility], you and your health needs are our priority.  As part of our continuing mission to provide you with exceptional heart care, we have created designated Provider Care Teams.  These Care Teams include your primary Cardiologist (physician) and Advanced Practice Providers (APPs -  Physician Assistants and Nurse Practitioners) who all work together to provide you with the care you need, when you need it.  We recommend signing up for the patient portal called "MyChart".  Sign up information is provided on this After Visit Summary.  MyChart is used to connect with patients for Virtual Visits (Telemedicine).  Patients are able to view lab/test results, encounter notes, upcoming appointments, etc.  Non-urgent messages can be sent to your provider as well.   To learn more about what you can do with MyChart, go to [email or web address].    Your next appointment:   6 month(s)  The format for your next appointment:   In Person  Provider:   Kathlyn Sacramento, MD   Other Instructions  Potassium Content of Foods  Potassium is a mineral found in many foods and drinks. It affects how the heart works, and helps keep fluids and minerals balanced in the body. The amount of potassium you need each day depends on your age and any medical conditions you may have. Talk to your health care provider or dietitian about how much potassium you  need. The following lists of foods provide the general serving size for foods and the approximate amount of potassium in each serving, listed in milligrams (mg). Actual values may vary depending on the product and how it is processed. High in potassium The following foods and beverages have 200 mg or more of potassium per serving:  Apricots (raw) - 2 have 200 mg of potassium.  Apricots (dry) - 5 have 200 mg of potassium.  Artichoke - 1 medium has 345 mg of potassium.  Avocado -  fruit has 245 mg of potassium.  Banana - 1 medium fruit has 425 mg of potassium.  [place] or baked beans (canned) -  cup has 280 mg of potassium.  White beans (canned) -  cup has 595 mg potassium.  Beef roast - 3 oz has 320 mg of potassium.  Ground beef - 3 oz has 270 mg of potassium.  Beets (raw or cooked) -  cup has 260 mg of potassium.  Bran muffin - 2 oz has 300 mg of potassium.  Broccoli (cooked) -  cup has 230 mg of potassium.  Brussels sprouts -  cup has 250 mg of potassium.  Cantaloupe -  cup has 215 mg of potassium.  Cereal, 100% bran -  cup has 200-400 mg of potassium.  Cheeseburger -1 single fast food burger has 225-400 mg of potassium.  Chicken - 3 oz has 220 mg of potassium.  Clams (canned) - 3 oz has 535 mg of potassium.  Crab - 3  oz has 225 mg of potassium.  Dates - 5 have 270 mg of potassium.  Dried beans and peas -  cup has 300-475 mg of potassium.  Figs (dried) - 2 have 260 mg of potassium.  Fish (halibut, tuna, cod, snapper) - 3 oz has 480 mg of potassium.  Fish (salmon, haddock, swordfish, perch) - 3 oz has 300 mg of potassium.  Fish (tuna, canned) - 3 oz has 200 mg of potassium.  [place] fries (fast food) - 3 oz has 470 mg of potassium.  Granola with fruit and nuts -  cup has 200 mg of potassium.  Grapefruit juice -  cup has 200 mg of potassium.  Honeydew melon -  cup has 200 mg of potassium.  Kale (raw) - 1 cup has 300 mg of potassium.  Kiwi - 1  medium fruit has 240 mg of potassium.  Kohlrabi, rutabaga, parsnips -  cup has 280 mg of potassium.  Lentils -  cup has 365 mg of potassium.  Mango - 1 each has 325 mg of potassium.  Milk (nonfat, low-fat, whole, buttermilk) - 1 cup has 350-380 mg of potassium.  Milk (chocolate) - 1 cup has 420 mg of potassium  Molasses - 1 Tbsp has 295 mg of potassium.  Mushrooms -  cup has 280 mg of potassium.  Nectarine - 1 each has 275 mg of potassium.  Nuts (almonds, peanuts, hazelnuts, [place], cashew, mixed) - 1 oz has 200 mg of potassium.  Nuts (pistachios) - 1 oz has 295 mg of potassium.  Orange - 1 fruit has 240 mg of potassium.  Orange juice -  cup has 235 mg of potassium.  Papaya -  medium fruit has 390 mg of potassium.  Peanut butter (chunky) - 2 Tbsp has 240 mg of potassium.  Peanut butter (smooth) - 2 Tbsp has 210 mg of potassium.  Pear - 1 medium (200 mg) of potassium.  Pomegranate - 1 whole fruit has 400 mg of potassium.  Pomegranate juice -  cup has 215 mg of potassium.  Pork - 3 oz has 350 mg of potassium.  Potato chips (salted) - 1 oz has 465 mg of potassium.  Potato (baked with skin) - 1 medium has 925 mg of potassium.  Potato (boiled) -  cup has 255 mg of potassium.  Potato (Mashed) -  cup has 330 mg of potassium.  Prune juice -  cup has 370 mg of potassium.  Prunes - 5 have 305 mg of potassium.  Pudding (chocolate) -  cup has 230 mg of potassium.  Pumpkin (canned) -  cup has 250 mg of potassium.  Raisins (seedless) -  cup has 270 mg of potassium.  Seeds (sunflower or pumpkin) - 1 oz has 240 mg of potassium.  Soy milk - 1 cup has 300 mg of potassium.  Spinach (cooked) - 1/2 cup has 420 mg of potassium.  Spinach (canned) -  cup has 370 mg of potassium.  Sweet potato (baked with skin) - 1 medium has 450 mg of potassium.  Swiss chard -  cup has 480 mg of potassium.  Tomato or vegetable juice -  cup has 275 mg of  potassium.  Tomato (sauce or puree) -  cup has 400-550 mg of potassium.  Tomato (raw) - 1 medium has 290 mg of potassium.  Tomato (canned) -  cup has 200-300 mg of potassium.  [place] - 3 oz has 250 mg of potassium.  Wheat germ - 1 oz has 250 mg  of potassium.  Winter squash -  cup has 250 mg of potassium.  Yogurt (plain or fruited) - 6 oz has 260-435 mg of potassium.  Zucchini -  cup has 220 mg of potassium. Moderate in potassium The following foods and beverages have 50-200 mg of potassium per serving:  Apple - 1 fruit has 150 mg of potassium  Apple juice -  cup has 150 mg of potassium  Applesauce -  cup has 90 mg of potassium  Apricot nectar -  cup has 140 mg of potassium  Asparagus (small spears) -  cup has 155 mg of potassium  Asparagus (large spears) - 6 have 155 mg of potassium  Bagel (cinnamon raisin) - 1 four-inch bagel has 130 mg of potassium  Bagel (egg or plain) - 1 four- inch bagel has 70 mg of potassium  Beans (green) -  cup has 90 mg of potassium  Beans (yellow) -  cup has 190 mg of potassium  Beer, regular - 12 oz has 100 mg of potassium  Beets (canned) -  cup has 125 mg of potassium  Blackberries -  cup has 115 mg of potassium  Blueberries -  cup has 60 mg of potassium  Bread (whole wheat) - 1 slice has 70 mg of potassium  Broccoli (raw) -  cup has 145 mg of potassium  Cabbage -  cup has 150 mg of potassium  Carrots (cooked or raw) -  cup has 180 mg of potassium  Cauliflower (raw) -  cup has 150 mg of potassium  Celery (raw) -  cup has 155 mg of potassium  Cereal, bran flakes -  cup has 120-150 mg of potassium  Cheese (cottage) -  cup has 110 mg of potassium  Cherries - 10 have 150 mg of potassium  Chocolate - 1 oz bar has 165 mg of potassium  Coffee (brewed) - 6 oz has 90 mg of potassium  Corn -  cup or 1 ear has 195 mg of potassium  Cucumbers -  cup has 80 mg of potassium  Egg - 1 large egg has 60 mg of  potassium  Eggplant -  cup has 60 mg of potassium  Endive (raw) -  cup has 80 mg of potassium  English muffin - 1 has 65 mg of potassium  Fish (ocean perch) - 3 oz has 192 mg of potassium  Frankfurter, beef or pork - 1 has 75 mg of potassium  Fruit cocktail -  cup has 115 mg of potassium  Grape juice -  cup has 170 mg of potassium  Grapefruit -  fruit has 175 mg of potassium  Grapes -  cup has 155 mg of potassium  Greens: kale, turnip, collard -  cup has 110-150 mg of potassium  Ice cream or frozen yogurt (chocolate) -  cup has 175 mg of potassium  Ice cream or frozen yogurt (vanilla) -  cup has 120-150 mg of potassium  Lemons, limes - 1 each has 80 mg of potassium  Lettuce - 1 cup has 100 mg of potassium  Mixed vegetables -  cup has 150 mg of potassium  Mushrooms, raw -  cup has 110 mg of potassium  Nuts (walnuts, pecans, or macadamia) - 1 oz has 125 mg of potassium  Oatmeal -  cup has 80 mg of potassium  Okra -  cup has 110 mg of potassium  Onions -  cup has 120 mg of potassium  Peach - 1 has 185 mg of  potassium  Peaches (canned) -  cup has 120 mg of potassium  Pears (canned) -  cup has 120 mg of potassium  Peas, green (frozen) -  cup has 90 mg of potassium  Peppers (Green) -  cup has 130 mg of potassium  Peppers (Red) -  cup has 160 mg of potassium  Pineapple juice -  cup has 165 mg of potassium  Pineapple (fresh or canned) -  cup has 100 mg of potassium  Plums - 1 has 105 mg of potassium  Pudding, vanilla -  cup has 150 mg of potassium  Raspberries -  cup has 90 mg of potassium  Rhubarb -  cup has 115 mg of potassium  Rice, wild -  cup has 80 mg of potassium  Shrimp - 3 oz has 155 mg of potassium  Spinach (raw) - 1 cup has 170 mg of potassium  Strawberries -  cup has 125 mg of potassium  Summer squash -  cup has 175-200 mg of potassium  Swiss chard (raw) - 1 cup has 135 mg of potassium  Tangerines - 1 fruit  has 140 mg of potassium  Tea, brewed - 6 oz has 65 mg of potassium  Turnips -  cup has 140 mg of potassium  Watermelon -  cup has 85 mg of potassium  Wine (Red, table) - 5 oz has 180 mg of potassium  Wine (White, table) - 5 oz 100 mg of potassium Low in potassium The following foods and beverages have less than 50 mg of potassium per serving.  Bread (white) - 1 slice has 30 mg of potassium  Carbonated beverages - 12 oz has less than 5 mg of potassium  Cheese - 1 oz has 20-30 mg of potassium  Cranberries -  cup has 45 mg of potassium  Cranberry juice cocktail -  cup has 20 mg of potassium  Fats and oils - 1 Tbsp has less than 5 mg of potassium  Hummus - 1 Tbsp has 32 mg of potassium  Nectar (papaya, mango, or pear) -  cup has 35 mg of potassium  Rice (white or brown) -  cup has 50 mg of potassium  Spaghetti or macaroni (cooked) -  cup has 30 mg of potassium  Tortilla, flour or corn - 1 has 50 mg of potassium  Waffle - 1 four-inch waffle has 50 mg of potassium  Water chestnuts -  cup has 40 mg of potassium Summary  Potassium is a mineral found in many foods and drinks. It affects how the heart works, and helps keep fluids and minerals balanced in the body.  The amount of potassium you need each day depends on your age and any existing medical conditions you may have. Your health care provider or dietitian may recommend an amount of potassium that you should have each day. This information is not intended to replace advice given to you by your health care provider. Make sure you discuss any questions you have with your health care provider. Document Revised: 03/06/2017 Document Reviewed: 06/18/2016 Elsevier Patient Education  [place].

## 2020-09-14 ENCOUNTER — Other Ambulatory Visit: Payer: Self-pay | Admitting: Nurse Practitioner

## 2020-09-18 ENCOUNTER — Other Ambulatory Visit: Payer: Self-pay

## 2020-09-18 ENCOUNTER — Telehealth: Payer: Self-pay

## 2020-09-18 ENCOUNTER — Encounter: Payer: Medicare PPO | Admitting: Nurse Practitioner

## 2020-09-18 NOTE — Telephone Encounter (Signed)
Attempted to call patient's sister to start annual wellness visit, as instructed in appointment note. I attempted a total of 4 times with no success. I left messaged each time I called. letting her know that I would be calling from either a [place] or [place] number (since working remote) The last and final time I called I left message instructing sister to call office to reschedule appointment.  1st attempt 10:34 am 2nd attempt-10:40 am 3rd attempt-10:55 am 4th and final attempt-11:03 am

## 2020-09-20 ENCOUNTER — Encounter: Payer: Self-pay | Admitting: Nurse Practitioner

## 2020-09-20 ENCOUNTER — Telehealth: Payer: Self-pay

## 2020-09-20 ENCOUNTER — Other Ambulatory Visit: Payer: Self-pay

## 2020-09-20 ENCOUNTER — Ambulatory Visit (INDEPENDENT_AMBULATORY_CARE_PROVIDER_SITE_OTHER): Payer: Medicare PPO | Admitting: Nurse Practitioner

## 2020-09-20 DIAGNOSIS — Z0000 Encounter for general adult medical examination without abnormal findings: Secondary | ICD-10-CM

## 2020-09-20 NOTE — Telephone Encounter (Deleted)
[name], sentell are scheduled for a virtual visit with your provider today.    Just as we do with appointments in the office, we must obtain your consent to participate.  Your consent will be active for this visit and any virtual visit you may have with one of our providers in the next 365 days.    If you have a MyChart account, I can also send a copy of this consent to you electronically.  All virtual visits are billed to your insurance company just like a traditional visit in the office.  As this is a virtual visit, video technology does not allow for your provider to perform a traditional examination.  This may limit your provider's ability to fully assess your condition.  If your provider identifies any concerns that need to be evaluated in person or the need to arrange testing such as labs, EKG, etc, we will make arrangements to do so.    Although advances in technology are sophisticated, we cannot ensure that it will always work on either your end or our end.  If the connection with a video visit is poor, we may have to switch to a telephone visit.  With either a video or telephone visit, we are not always able to ensure that we have a secure connection.   I need to obtain your verbal consent now.   Are you willing to proceed with your visit today?   {Click here.  Press F2 and choose YES or NO                  :[id number]   [name], [facility] 09/20/2020  11:29 AM

## 2020-09-20 NOTE — Progress Notes (Signed)
This service is provided via telemedicine  No vital signs collected/recorded due to the encounter was a telemedicine visit.   Location of patient (ex: home, work):  Home  Patient consents to a telephone visit:  Yes, see encounter dated 09/20/2020  Location of the provider (ex: office, home):  [place]  Name of any referring provider:  N/A  Names of all persons participating in the telemedicine service and their role in the encounter:  [name], Nurse Practitioner, Carroll Kinds, CMA, patient and Lorayne Bender, Sister.  Time spent on call:  12 minutes with medical assistant

## 2020-09-20 NOTE — Telephone Encounter (Signed)
Mr. delmus, warwick are scheduled for a virtual visit with your provider today.    Just as we do with appointments in the office, we must obtain your consent to participate.  Your consent will be active for this visit and any virtual visit you may have with one of our providers in the next 365 days.    If you have a MyChart account, I can also send a copy of this consent to you electronically.  All virtual visits are billed to your insurance company just like a traditional visit in the office.  As this is a virtual visit, video technology does not allow for your provider to perform a traditional examination.  This may limit your provider's ability to fully assess your condition.  If your provider identifies any concerns that need to be evaluated in person or the need to arrange testing such as labs, EKG, etc, we will make arrangements to do so.    Although advances in technology are sophisticated, we cannot ensure that it will always work on either your end or our end.  If the connection with a video visit is poor, we may have to switch to a telephone visit.  With either a video or telephone visit, we are not always able to ensure that we have a secure connection.   I need to obtain your verbal consent now.   Are you willing to proceed with your visit today?   OKIE BOGACZ has provided verbal consent on 09/20/2020 for a virtual visit (video or telephone).   Carroll Kinds, CMA 09/20/2020  11:31 AM

## 2020-09-20 NOTE — Progress Notes (Signed)
Subjective:   [name] is a 80 y.o. male who presents for Medicare Annual/Subsequent preventive examination.  Review of Systems           Objective:    There were no vitals filed for this visit. There is no height or weight on file to calculate BMI.  Advanced Directives 09/20/2020 02/20/2020 10/03/2019 09/19/2019 06/03/2019 12/02/2018 09/01/2018  Does Patient Have a Medical Advance Directive? Yes Yes Yes Yes Yes Yes Yes  Type of Advance Directive Out of facility DNR (pink MOST or yellow form) Out of facility DNR (pink MOST or yellow form) [place];Living will Healthcare Power of [place];Living will Healthcare Power of [place]  Does patient want to make changes to medical advance directive? No - Patient declined No - Patient declined No - Patient declined No - Patient declined No - Patient declined No - Patient declined No - Patient declined  Copy of [place] in Chart? - - - No - copy requested Yes - validated most recent copy scanned in chart (See row information) No - copy requested No - copy requested  Would patient like information on creating a medical advance directive? - - - - - - -  Pre-existing out of facility DNR order (yellow form or pink MOST form) Yellow form placed in chart (order not valid for inpatient use);Pink MOST form placed in chart (order not valid for inpatient use) Yellow form placed in chart (order not valid for inpatient use);Pink MOST form placed in chart (order not valid for inpatient use) - - - - -    Current Medications (verified) Outpatient Encounter Medications as of 09/20/2020  Medication Sig   albuterol (PROVENTIL HFA;VENTOLIN HFA) 108 (90 Base) MCG/ACT inhaler Inhale 2 puffs into the lungs every 6 (six) hours as needed for wheezing or shortness of breath.   amLODipine (NORVASC) 10 MG tablet Take 1 tablet (10 mg total) by mouth daily.   aspirin EC 81 MG  tablet Take 1 tablet (81 mg total) by mouth daily.   atorvastatin (LIPITOR) 20 MG tablet TAKE 1 TABLET BY MOUTH EVERY DAY   Carboxymethylcellulose Sodium (EYE DROPS OP) Apply 1 drop to eye daily as needed (dry eyes).   carvedilol (COREG) 3.125 MG tablet Take 1 tablet (3.125 mg total) by mouth 2 (two) times daily with a meal. I48.0   Cyanocobalamin (VITAMIN B 12 PO) Take 1 tablet by mouth daily.   divalproex (DEPAKOTE) 250 MG DR tablet TAKE 1 TABLET BY MOUTH THREE TIMES A DAY AS NEEDED FOR SEIZURES   hydrALAZINE (APRESOLINE) 50 MG tablet Take 1 tablet (50 mg total) by mouth 3 (three) times daily.   levETIRAcetam (KEPPRA) 500 MG tablet TAKE 1 TABLET BY MOUTH TWICE A DAY   memantine (NAMENDA) 10 MG tablet TAKE 1 TABLET BY MOUTH TWICE A DAY   Multiple Vitamins-Minerals (THEREMS-M) TABS TAKE 1 TABLET BY MOUTH EVERY DAY   sodium zirconium cyclosilicate (LOKELMA) 10 g PACK packet Take 10 g by mouth daily.   thiamine ([employer] B-1) 100 MG tablet Take 1 tablet (100 mg total) by mouth daily.   No facility-administered encounter medications on file as of 09/20/2020.    Allergies (verified) Diltiazem and Penicillins   History: Past Medical History:  Diagnosis Date   Alcohol abuse    Anemia    Aneurysm (HCC)    CKD (chronic kidney disease), stage III (HCC)    COPD (chronic obstructive pulmonary disease) (  [place])    Dysrhythmia    Fever of unknown origin 08/2016   History of atrial fibrillation    Hyperchloremia    Hypercholesteremia    Hyperpotassemia    Hypertension    Hypertensive renal disease, benign    Leg edema    PAD (peripheral artery disease) (HCC)    Pneumonia    Pressure ulcer of left heel    Seizures ([place])    has not had a seizure in 15 yrs   Tobacco use    Weight loss    Past Surgical History:  Procedure Laterality Date   ABDOMINAL AORTOGRAM W/LOWER EXTREMITY N/A 08/13/2016   Procedure: Abdominal Aortogram w/Lower Extremity;  Surgeon: Jeremy Hampshire, MD;  Location: [facility];  Service: Cardiovascular;  Laterality: N/A;   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, 1996   EYE SURGERY     December 2017   PERIPHERAL VASCULAR BALLOON ANGIOPLASTY  08/13/2016   Procedure: Peripheral Vascular Balloon Angioplasty;  Surgeon: Jeremy Hampshire, MD;  Location: [facility];  Service: Cardiovascular;;  Aborted   Family History  Problem Relation Age of Onset   Cancer Mother    Congestive Heart Failure Father    Alzheimer's disease Sister    Congestive Heart Failure Sister    Clotting disorder Sister    Congestive Heart Failure Brother    Clotting disorder Sister    Social History   Socioeconomic History   Marital status: Single    Spouse name: Not on file   Number of children: Not on file   Years of education: Not on file   Highest education level: Not on file  Occupational History   Not on file  Tobacco Use   Smoking status: Some Days    Years: 56.00    Pack years: 0.00    Types: Cigarettes   Smokeless tobacco: Never   Tobacco comments:    2-3 daily, Smokes occasionally   Vaping Use   Vaping Use: Never used  Substance and Sexual Activity   Alcohol use: Not Currently    Alcohol/week: 0.0 standard drinks   Drug use: No   Sexual activity: Not Currently  Other Topics Concern   Not on file  Social History Narrative   Not on file   Social Determinants of Health   Financial Resource Strain: Not on file  Food Insecurity: Not on file  Transportation Needs: Not on file  Physical Activity: Not on file  Stress: Not on file  Social Connections: Not on file    Tobacco Counseling Ready to quit: Not Answered Counseling given: Not Answered Tobacco comments: 2-3 daily, Smokes occasionally    Clinical Intake:                 Diabetic? no        Activities of Daily Living No flowsheet data found.  Patient Care Team: Jeremy Chandler, NP as PCP - General (Nurse Practitioner)  Indicate any recent Medical Services you may have  received from other than [facility] providers in the past year (date may be approximate).     Assessment:   This is a routine wellness examination for [name]  Hearing/Vision screen Hearing Screening - Comments:: Patient has no hearing problems. Vision Screening - Comments:: Patient has eye exam scheduled July. Patient sees Dr. Bing [name]  Dietary issues and exercise activities discussed:     Goals Addressed   None    Depression Screen [facility] 2/9 Scores 09/20/2020 10/03/2019 09/19/2019 06/03/2019  04/06/2019 12/02/2018 09/01/2018  PHQ - 2 Score 0 0 0 0 0 0 0    Fall Risk Fall Risk  09/20/2020 02/20/2020 10/03/2019 09/19/2019 06/03/2019  Falls in the past year? 0 0 0 0 0  Number falls in past yr: 0 0 0 0 0  Comment - - - - -  Injury with Fall? 0 0 0 0 0  Risk for fall due to : - - - - -    FALL RISK PREVENTION PERTAINING TO THE HOME:  Any stairs in or around the home? No  If so, are there any without handrails? No  Home free of loose throw rugs in walkways, pet beds, electrical cords, etc? Yes  Adequate lighting in your home to reduce risk of falls? Yes   ASSISTIVE DEVICES UTILIZED TO PREVENT FALLS:  Life alert? No  Use of a cane, walker or w/c? Yes  Grab bars in the bathroom? Yes  Shower chair or bench in shower? No  Elevated toilet seat or a handicapped toilet? No   TIMED UP AND GO:  Was the test performed? No .    Cognitive Function: MMSE - Mini Mental State Exam 09/25/2016 04/12/2015 03/07/2014  Not completed: - (No Data) -  Orientation to time 2 5 4   Orientation to Place 3 5 5   Registration 3 3 3   Attention/ Calculation 0 4 3  Recall 1 1 3   Language- name 2 objects 2 2 2   Language- repeat 1 1 1   Language- follow 3 step command 3 3 3   Language- read & follow direction 1 1 1   Write a sentence 0 1 1  Copy design 1 0 1  Total score 17 26 27      6CIT Screen 09/20/2020 09/19/2019 09/01/2018  What Year? 0 points 0 points 0 points  What month? 0 points 0 points 0 points  What time? 0  points 0 points 0 points  Count back from 20 0 points 4 points 2 points  Months in reverse 4 points 2 points 4 points  Repeat phrase 10 points 10 points 10 points  Total Score 14 16 16     Immunizations Immunization History  Administered Date(s) Administered   Fluad Quad(high Dose 65+) 12/02/2018, 02/20/2020   Influenza, High Dose Seasonal PF 11/26/2016, 12/11/2017   Influenza,inj,Quad PF,6+ Mos 01/19/2014, 04/12/2015, 12/13/2015   PFIZER(Purple Top)SARS-COV-2 Vaccination 04/28/2019, 05/27/2019, 03/23/2020   Pneumococcal Conjugate-13 04/12/2015   Pneumococcal Polysaccharide-23 02/02/2013   Td 04/07/2008    TDAP status: Due, Education has been provided regarding the importance of this vaccine. Advised may receive this vaccine at local pharmacy or Health Dept. Aware to provide a copy of the vaccination record if obtained from local pharmacy or Health Dept. Verbalized acceptance and understanding.  Flu Vaccine status: Up to date  Pneumococcal vaccine status: Up to date  Covid-19 vaccine status: Completed vaccines  Qualifies for Shingles Vaccine? Yes   Zostavax completed No   Shingrix Completed?: No.    Education has been provided regarding the importance of this vaccine. Patient has been advised to call insurance company to determine out of pocket expense if they have not yet received this vaccine. Advised may also receive vaccine at local pharmacy or Health Dept. Verbalized acceptance and understanding.  Screening Tests Health Maintenance  Topic Date Due   Zoster Vaccines- Shingrix (1 of 2) Never done   TETANUS/TDAP  04/07/2018   COVID-19 Vaccine (4 - Booster for Pfizer series) 07/22/2020   INFLUENZA VACCINE  11/05/2020  PNA vac Low Risk Adult  Completed   HPV VACCINES  Aged Out    Health Maintenance  Health Maintenance Due  Topic Date Due   Zoster Vaccines- Shingrix (1 of 2) Never done   TETANUS/TDAP  04/07/2018   COVID-19 Vaccine (4 - Booster for Pfizer series)  07/22/2020    Colorectal cancer screening: No longer required.   Lung Cancer Screening: (Low Dose CT Chest recommended if Age 6-80 years, 30 pack-year currently smoking OR have quit w/in 15years.) does not qualify.   Lung Cancer Screening Referral: na  Additional Screening:  Hepatitis C Screening: does not qualify; Completed   Vision Screening: Recommended annual ophthalmology exams for early detection of glaucoma and other disorders of the eye. Is the patient up to date with their annual eye exam?  Yes  Who is the provider or what is the name of the office in which the patient attends annual eye exams? Dr. Bing [name] If pt is not established with a provider, would they like to be referred to a provider to establish care? No .   Dental Screening: Recommended annual dental exams for proper oral hygiene  Community Resource Referral / Chronic Care Management: CRR required this visit?  No   CCM required this visit?  No      Plan:     I have personally reviewed and noted the following in the patient's chart:   Medical and social history Use of alcohol, tobacco or illicit drugs  Current medications and supplements including opioid prescriptions. Patient is not currently taking opioid prescriptions. Functional ability and status Nutritional status Physical activity Advanced directives List of other physicians Hospitalizations, surgeries, and ER visits in previous 12 months Vitals Screenings to include cognitive, depression, and falls Referrals and appointments  In addition, I have reviewed and discussed with patient certain preventive protocols, quality metrics, and best practice recommendations. A written personalized care plan for preventive services as well as general preventive health recommendations were provided to patient.     Jeremy Chandler, NP   09/20/2020    Virtual Visit via Telephone Note  I connected withNAME@ on 09/20/20 at 11:00 AM EDT by telephone and  verified that I am speaking with the correct person using two identifiers.  Location: Patient: home Provider: [facility]   I discussed the limitations, risks, security and privacy concerns of performing an evaluation and management service by telephone and the availability of in person appointments. I also discussed with the patient that there may be a patient responsible charge related to this service. The patient expressed understanding and agreed to proceed.   I discussed the assessment and treatment plan with the patient. The patient was provided an opportunity to ask questions and all were answered. The patient agreed with the plan and demonstrated an understanding of the instructions.   The patient was advised to call back or seek an in-person evaluation if the symptoms worsen or if the condition fails to improve as anticipated.  I provided 15 minutes of non-face-to-face time during this encounter.  [employer]. Harle Battiest [employer] printed and mailed

## 2020-09-20 NOTE — Patient Instructions (Signed)
Mr. [name] , Thank you for taking time to come for your Medicare Wellness Visit. I appreciate your ongoing commitment to your health goals. Please review the following plan we discussed and let me know if I can assist you in the future.   Screening recommendations/referrals: Colonoscopy aged out Recommended yearly ophthalmology/optometry visit for glaucoma screening and checkup Recommended yearly dental visit for hygiene and checkup  Vaccinations: Influenza vaccine up to date Pneumococcal vaccine up to date Tdap vaccine RECOMMENDED to get at local pharmacy Shingles vaccine RECOMMENDED to get a local pharmacy    Advanced directives: make sure you have the most recent documents on file.   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   Next appointment: 1 year for AWV  Preventive Care 1 Years and Older, Male Preventive care refers to lifestyle choices and visits with your health care provider that can promote health and wellness. What does preventive care include? A yearly physical exam. This is also called an annual well check. Dental exams once or twice a year. Routine eye exams. Ask your health care provider how often you should have your eyes checked. Personal lifestyle choices, including: Daily care of your teeth and gums. Regular physical activity. Eating a healthy diet. Avoiding tobacco and drug use. Limiting alcohol use. Practicing safe sex. Taking low doses of aspirin every day. Taking vitamin and mineral supplements as recommended by your health care provider. What happens during an annual well check? The services and screenings done by your health care provider during your annual well check will depend on your age, overall health, lifestyle risk factors, and family history of disease. Counseling  Your health care provider may ask you questions about your: Alcohol use. Tobacco use. Drug use. Emotional well-being. Home and relationship  well-being. Sexual activity. Eating habits. History of falls. Memory and ability to understand (cognition). Work and work Statistician. Screening  You may have the following tests or measurements: Height, weight, and BMI. Blood pressure. Lipid and cholesterol levels. These may be checked every 5 years, or more frequently if you are over 78 years old. Skin check. Lung cancer screening. You may have this screening every year starting at age 46 if you have a 30-pack-year history of smoking and currently smoke or have quit within the past 15 years. Fecal occult blood test (FOBT) of the stool. You may have this test every year starting at age 63. Flexible sigmoidoscopy or colonoscopy. You may have a sigmoidoscopy every 5 years or a colonoscopy every 10 years starting at age 20. Prostate cancer screening. Recommendations will vary depending on your family history and other risks. Hepatitis C blood test. Hepatitis B blood test. Sexually transmitted disease (STD) testing. Diabetes screening. This is done by checking your blood sugar (glucose) after you have not eaten for a while (fasting). You may have this done every 1-3 years. Abdominal aortic aneurysm (AAA) screening. You may need this if you are a current or former smoker. Osteoporosis. You may be screened starting at age 60 if you are at high risk. Talk with your health care provider about your test results, treatment options, and if necessary, the need for more tests. Vaccines  Your health care provider may recommend certain vaccines, such as: Influenza vaccine. This is recommended every year. Tetanus, diphtheria, and acellular pertussis (Tdap, Td) vaccine. You may need a Td booster every 10 years. Zoster vaccine. You may need this after age 63. Pneumococcal 13-valent conjugate (PCV13) vaccine. One dose is recommended after age 48. Pneumococcal polysaccharide (  PPSV23) vaccine. One dose is recommended after age 60. Talk to your health care  provider about which screenings and vaccines you need and how often you need them. This information is not intended to replace advice given to you by your health care provider. Make sure you discuss any questions you have with your health care provider. Document Released: 04/20/2015 Document Revised: 12/12/2015 Document Reviewed: 01/23/2015 Elsevier Interactive Patient Education  2017 [place] Prevention in the Home Falls can cause injuries. They can happen to people of all ages. There are many things you can do to make your home safe and to help prevent falls. What can I do on the outside of my home? Regularly fix the edges of walkways and driveways and fix any cracks. Remove anything that might make you trip as you walk through a door, such as a raised step or threshold. Trim any bushes or trees on the path to your home. Use bright outdoor lighting. Clear any walking paths of anything that might make someone trip, such as rocks or tools. Regularly check to see if handrails are loose or broken. Make sure that both sides of any steps have handrails. Any raised decks and porches should have guardrails on the edges. Have any leaves, snow, or ice cleared regularly. Use sand or salt on walking paths during winter. Clean up any spills in your garage right away. This includes oil or grease spills. What can I do in the bathroom? Use night lights. Install grab bars by the toilet and in the tub and shower. Do not use towel bars as grab bars. Use non-skid mats or decals in the tub or shower. If you need to sit down in the shower, use a plastic, non-slip stool. Keep the floor dry. Clean up any water that spills on the floor as soon as it happens. Remove soap buildup in the tub or shower regularly. Attach bath mats securely with double-sided non-slip rug tape. Do not have throw rugs and other things on the floor that can make you trip. What can I do in the bedroom? Use night lights. Make  sure that you have a light by your bed that is easy to reach. Do not use any sheets or blankets that are too big for your bed. They should not hang down onto the floor. Have a firm chair that has side arms. You can use this for support while you get dressed. Do not have throw rugs and other things on the floor that can make you trip. What can I do in the kitchen? Clean up any spills right away. Avoid walking on wet floors. Keep items that you use a lot in easy-to-reach places. If you need to reach something above you, use a strong step stool that has a grab bar. Keep electrical cords out of the way. Do not use floor polish or wax that makes floors slippery. If you must use wax, use non-skid floor wax. Do not have throw rugs and other things on the floor that can make you trip. What can I do with my stairs? Do not leave any items on the stairs. Make sure that there are handrails on both sides of the stairs and use them. Fix handrails that are broken or loose. Make sure that handrails are as long as the stairways. Check any carpeting to make sure that it is firmly attached to the stairs. Fix any carpet that is loose or worn. Avoid having throw rugs at the top or  bottom of the stairs. If you do have throw rugs, attach them to the floor with carpet tape. Make sure that you have a light switch at the top of the stairs and the bottom of the stairs. If you do not have them, ask someone to add them for you. What else can I do to help prevent falls? Wear shoes that: Do not have high heels. Have rubber bottoms. Are comfortable and fit you well. Are closed at the toe. Do not wear sandals. If you use a stepladder: Make sure that it is fully opened. Do not climb a closed stepladder. Make sure that both sides of the stepladder are locked into place. Ask someone to hold it for you, if possible. Clearly mark and make sure that you can see: Any grab bars or handrails. First and last steps. Where the  edge of each step is. Use tools that help you move around (mobility aids) if they are needed. These include: Canes. Walkers. Scooters. Crutches. Turn on the lights when you go into a dark area. Replace any light bulbs as soon as they burn out. Set up your furniture so you have a clear path. Avoid moving your furniture around. If any of your floors are uneven, fix them. If there are any pets around you, be aware of where they are. Review your medicines with your doctor. Some medicines can make you feel dizzy. This can increase your chance of falling. Ask your doctor what other things that you can do to help prevent falls. This information is not intended to replace advice given to you by your health care provider. Make sure you discuss any questions you have with your health care provider. Document Released: 01/18/2009 Document Revised: 08/30/2015 Document Reviewed: 04/28/2014 Elsevier Interactive Patient Education  2017 [employer].

## 2020-09-21 ENCOUNTER — Encounter: Payer: Medicare PPO | Admitting: Nurse Practitioner

## 2020-09-28 ENCOUNTER — Other Ambulatory Visit: Payer: Self-pay | Admitting: Nurse Practitioner

## 2020-10-12 ENCOUNTER — Encounter: Payer: Self-pay | Admitting: Nurse Practitioner

## 2020-10-12 ENCOUNTER — Other Ambulatory Visit: Payer: Self-pay

## 2020-10-12 ENCOUNTER — Ambulatory Visit: Payer: Medicare PPO | Admitting: Nurse Practitioner

## 2020-10-12 VITALS — BP 143/79 | HR 59 | Temp 97.5°F | Ht 68.0 in | Wt 121.4 lb

## 2020-10-12 DIAGNOSIS — F0391 Unspecified dementia with behavioral disturbance: Secondary | ICD-10-CM

## 2020-10-12 DIAGNOSIS — R5381 Other malaise: Secondary | ICD-10-CM | POA: Diagnosis not present

## 2020-10-12 DIAGNOSIS — N1832 Chronic kidney disease, stage 3b: Secondary | ICD-10-CM

## 2020-10-12 DIAGNOSIS — J449 Chronic obstructive pulmonary disease, unspecified: Secondary | ICD-10-CM | POA: Diagnosis not present

## 2020-10-12 DIAGNOSIS — I10 Essential (primary) hypertension: Secondary | ICD-10-CM

## 2020-10-12 DIAGNOSIS — E782 Mixed hyperlipidemia: Secondary | ICD-10-CM

## 2020-10-12 DIAGNOSIS — I5032 Chronic diastolic (congestive) heart failure: Secondary | ICD-10-CM

## 2020-10-12 DIAGNOSIS — R569 Unspecified convulsions: Secondary | ICD-10-CM

## 2020-10-12 NOTE — Progress Notes (Signed)
Careteam: Patient Care Team: [name], NP as PCP - General (Nurse Practitioner)  PLACE OF SERVICE:  [place] Directive information Does Patient Have a Medical Advance Directive?: Yes, Type of Advance Directive: Out of facility DNR (pink MOST or yellow form), Pre-existing out of facility DNR order (yellow form or pink MOST form): Yellow form placed in chart (order not valid for inpatient use);Pink MOST form placed in chart (order not valid for inpatient use), Does patient want to make changes to medical advance directive?: No - Patient declined  Allergies  Allergen Reactions   Diltiazem Rash and Other (See Comments)    Blisters   Penicillins Other (See Comments)    Unknown Childhood allergy  Has patient had a PCN reaction causing immediate rash, facial/tongue/throat swelling, SOB or lightheadedness with hypotension: unknown Has patient had a PCN reaction causing severe rash involving mucus membranes or skin necrosis: unknown Has patient had a PCN reaction that required hospitalization unknown Has patient had a PCN reaction occurring within the last 10 years:no If all of the above answers are "NO", then may proceed with Cephalosporin use.     Chief Complaint  Patient presents with   Follow-up    Concerns about patient not beign able to care for himself. Memory issues. Would like to discuss several issues. Patient with sister Stanton Kidney today.   Health Maintenance    Zoster vaccine,Tetanus/tdap, 2nd COVID booster.     HPI: Patient is a 80 y.o. male for routine follow up.  Mary sister with him today. Need to make some decisions in regards to meeting his needs. Not able to care for himself. Stanton Kidney thinks he needs to move to skilled facility.  Pt lives with brother. He reports in the last few weeks/months there has been more issues. He will not move unless someone helps him.  He needs more extensive care per brother. Not taking his medication.  Not eating like he  should, mary reports impossible to ensure he is getting this. He will tell them he has taken medication or eaten but he has not.  Stanton Kidney tries to call and encourage him but she is not there all the time.   He did not take medication this morning. Had a hard time getting him dressed to get out the door  Review of Systems:  Review of Systems  Unable to perform ROS: Dementia   Past Medical History:  Diagnosis Date   Alcohol abuse    Anemia    Aneurysm (HCC)    CKD (chronic kidney disease), stage III (HCC)    COPD (chronic obstructive pulmonary disease) (HCC)    Dysrhythmia    Fever of unknown origin 08/2016   History of atrial fibrillation    Hyperchloremia    Hypercholesteremia    Hyperpotassemia    Hypertension    Hypertensive renal disease, benign    Leg edema    PAD (peripheral artery disease) (HCC)    Pneumonia    Pressure ulcer of left heel    Seizures ([place])    has not had a seizure in 15 yrs   Tobacco use    Weight loss    Past Surgical History:  Procedure Laterality Date   ABDOMINAL AORTOGRAM W/LOWER EXTREMITY N/A 08/13/2016   Procedure: Abdominal Aortogram w/Lower Extremity;  Surgeon: Wellington Hampshire, MD;  Location: [facility];  Service: Cardiovascular;  Laterality: N/A;   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, 1996   EYE SURGERY  December 2017   PERIPHERAL VASCULAR BALLOON ANGIOPLASTY  08/13/2016   Procedure: Peripheral Vascular Balloon Angioplasty;  Surgeon: Wellington Hampshire, MD;  Location: [facility];  Service: Cardiovascular;;  Aborted   Social History:   reports that he has been smoking cigarettes. He has never used smokeless tobacco. He reports previous alcohol use. He reports that he does not use drugs.  Family History  Problem Relation Age of Onset   Cancer Mother    Congestive Heart Failure Father    Alzheimer's disease Sister    Congestive Heart Failure Sister    Clotting disorder Sister    Congestive Heart Failure Brother    Clotting  disorder Sister     Medications: Patient's Medications  New Prescriptions   No medications on file  Previous Medications   ALBUTEROL (PROVENTIL HFA;VENTOLIN HFA) 108 (90 BASE) MCG/ACT INHALER    Inhale 2 puffs into the lungs every 6 (six) hours as needed for wheezing or shortness of breath.   AMLODIPINE (NORVASC) 10 MG TABLET    Take 1 tablet (10 mg total) by mouth daily.   ASPIRIN EC 81 MG TABLET    Take 1 tablet (81 mg total) by mouth daily.   ATORVASTATIN (LIPITOR) 20 MG TABLET    TAKE 1 TABLET BY MOUTH EVERY DAY   CARBOXYMETHYLCELLULOSE SODIUM (EYE DROPS OP)    Apply 1 drop to eye daily as needed (dry eyes).   CARVEDILOL (COREG) 3.125 MG TABLET    Take 1 tablet (3.125 mg total) by mouth 2 (two) times daily with a meal. I48.0   CYANOCOBALAMIN (VITAMIN B 12 PO)    Take 1 tablet by mouth daily.   DIVALPROEX (DEPAKOTE) 250 MG DR TABLET    TAKE 1 TABLET BY MOUTH THREE TIMES A DAY AS NEEDED FOR SEIZURES   HYDRALAZINE (APRESOLINE) 50 MG TABLET    Take 1 tablet (50 mg total) by mouth 3 (three) times daily.   LEVETIRACETAM (KEPPRA) 500 MG TABLET    TAKE 1 TABLET BY MOUTH TWICE A DAY   MEMANTINE (NAMENDA) 10 MG TABLET    TAKE 1 TABLET BY MOUTH TWICE A DAY   MULTIPLE VITAMINS-MINERALS (THEREMS-M) TABS    TAKE 1 TABLET BY MOUTH EVERY DAY   SODIUM ZIRCONIUM CYCLOSILICATE (LOKELMA) 10 G PACK PACKET    Take 10 g by mouth daily.   THIAMINE (CVS B-1) 100 MG TABLET    Take 1 tablet (100 mg total) by mouth daily.  Modified Medications   No medications on file  Discontinued Medications   No medications on file    Physical Exam:  Vitals:   10/12/20 1044  BP: (!) 143/79  Pulse: (!) 59  Temp: (!) 97.5 F (36.4 C)  TempSrc: Temporal  SpO2: 98%  Weight: 121 lb 6.4 oz (55.1 kg)  Height: '5\' 8"'  (1.727 m)   Body mass index is 18.46 kg/m. Wt Readings from Last 3 Encounters:  10/12/20 121 lb 6.4 oz (55.1 kg)  08/21/20 132 lb 3.2 oz (60 kg)  02/20/20 116 lb (52.6 kg)    Physical  Exam Constitutional:      General: He is not in acute distress.    Appearance: He is well-developed. He is not diaphoretic.     Comments: Frail   HENT:     Head: Normocephalic and atraumatic.     Right Ear: External ear normal.     Left Ear: External ear normal.     Mouth/Throat:     Pharynx: No  oropharyngeal exudate.  Eyes:     Conjunctiva/sclera: Conjunctivae normal.     Pupils: Pupils are equal, round, and reactive to light.  Cardiovascular:     Rate and Rhythm: Normal rate and regular rhythm.     Heart sounds: Murmur heard.  Pulmonary:     Effort: Pulmonary effort is normal.     Breath sounds: Normal breath sounds.  Abdominal:     General: Bowel sounds are normal.     Palpations: Abdomen is soft.  Musculoskeletal:        General: No tenderness.     Cervical back: Normal range of motion and neck supple.     Right lower leg: No edema.     Left lower leg: No edema.  Skin:    General: Skin is warm and dry.  Neurological:     Mental Status: He is alert.     Motor: Weakness present.     Gait: Gait abnormal.    Labs reviewed: Basic Metabolic Panel: Recent Labs    02/20/20 1201 02/27/20 0934 02/29/20 0919 03/05/20 0000 03/09/20 0920  NA 141   < > 139 136 142  K 5.6*   < > 6.1* 5.7* 4.9  CL 109   < > 109 106 108  CO2 26   < > '26 24 30  ' GLUCOSE 87   < > 73 89 78  BUN 28*   < > '19 19 23  ' CREATININE 1.24*   < > 1.33* 1.54* 1.18*  CALCIUM 8.5*   < > 8.7 8.8 8.0*  TSH 1.10  --   --   --   --    < > = values in this interval not displayed.   Liver Function Tests: Recent Labs    02/20/20 1201  AST 16  ALT 7*  BILITOT 0.2  PROT 6.4   No results for input(s): LIPASE, AMYLASE in the last 8760 hours. No results for input(s): AMMONIA in the last 8760 hours. CBC: Recent Labs    02/20/20 1201  WBC 6.3  NEUTROABS 2,867  HGB 11.0*  HCT 34.6*  MCV 92.8  PLT 137*   Lipid Panel: No results for input(s): CHOL, HDL, LDLCALC, TRIG, CHOLHDL, LDLDIRECT in the last  8760 hours. TSH: Recent Labs    02/20/20 1201  TSH 1.10   A1C: Lab Results  Component Value Date   HGBA1C 5.2 04/15/2016     Assessment/Plan 1. Debility -worsening debility due to progressive dementia. Not as mobile, not taking medication as prescribed or eating correctly. Family looking into facility placement. - Ambulatory referral to Home Health  2. Stage 3b chronic kidney disease ([place]) -Encourage proper hydration and to avoid NSAIDS (Aleve, Advil, Motrin, Ibuprofen)  - AMB Referral to [facility] - Ambulatory referral to [place]  3. COPD, group B, by GOLD 2017 classification ([place]) Stable at this time - AMB Referral to [facility] - Ambulatory referral to [place]  4. Chronic diastolic heart failure (HCC) Euvolemic at this time, continues on coreg - AMB Referral to [facility] - Ambulatory referral to [facility]  5. Dementia with behavioral disturbance, unspecified dementia type ([place]) -progressive decline. Family reports he is needing more care at home then they are able to provide. Will have home health evaluation. He does live with brother but sister coordinates most of care and takes him to appts, she does not live with him but helps as much as she can. Information provided to [facility] facilities in  the area. He will also need FL2 completed once they find a facility that will accept him.  - AMB Referral to [facility] - Ambulatory referral to [place] [place]  6. Essential hypertension, benign -has not taken medication this morning, will follow up labs.  - CMP with eGFR([facility]) - CBC with Differential/Platelet - AMB Referral to [facility] - Ambulatory referral to [place]  7. Seizures ([place]) -possibly having seizures per brother but he is "not sure" -encouraged reminders so he takes medication as prescribed.  - AMB Referral to [facility] - Ambulatory referral to [place]  8. Mixed hyperlipidemia Continues  on lipitor 20 mg daily - Lipid panel - Ambulatory referral to [place]   Next appt: 3 months. Carlos American. [place], [facility] [phone]

## 2020-10-12 NOTE — Patient Instructions (Signed)
[facility] [place] 3.9 (26)  Nursing home [address]  [phone] Open 24 hours   [place] 2.6 (35)  Nursing home [place] ? Closes 9PM  [phone]  [place] 2.5 [address] [facility] in [place] Address: [address] [place], [place] [address] Hours:  Open 24 hours Phone: [phone]  [place] 2.1 (32)  Nursing home [place] 24 hours  [phone]  [facility] at [place] 2.7 (43)  Nursing home [place] 24 hours  [phone]

## 2020-10-13 LAB — CBC WITH DIFFERENTIAL/PLATELET
Absolute Monocytes: 702 cells/uL (ref 200–950)
Basophils Absolute: 23 cells/uL (ref 0–200)
Basophils Relative: 0.3 %
Eosinophils Absolute: 78 cells/uL (ref 15–500)
Eosinophils Relative: 1 %
HCT: 33 % — ABNORMAL LOW (ref 38.5–50.0)
Hemoglobin: 10.4 g/dL — ABNORMAL LOW (ref 13.2–17.1)
Lymphs Abs: 2028 cells/uL (ref 850–3900)
MCH: 28.9 pg (ref 27.0–33.0)
MCHC: 31.5 g/dL — ABNORMAL LOW (ref 32.0–36.0)
MCV: 91.7 fL (ref 80.0–100.0)
MPV: 12.1 fL (ref 7.5–12.5)
Monocytes Relative: 9 %
Neutro Abs: 4969 cells/uL (ref 1500–7800)
Neutrophils Relative %: 63.7 %
Platelets: 135 10*3/uL — ABNORMAL LOW (ref 140–400)
RBC: 3.6 10*6/uL — ABNORMAL LOW (ref 4.20–5.80)
RDW: 14 % (ref 11.0–15.0)
Total Lymphocyte: 26 %
WBC: 7.8 10*3/uL (ref 3.8–10.8)

## 2020-10-13 LAB — LIPID PANEL
Cholesterol: 164 mg/dL (ref <200)
HDL: 42 mg/dL (ref >=40)
LDL Cholesterol (Calc): 102 mg/dL (calc) — ABNORMAL HIGH
Non-HDL Cholesterol (Calc): 122 mg/dL (calc) (ref <130)
Total CHOL/HDL Ratio: 3.9 (calc) (ref <5.0)
Triglycerides: 104 mg/dL (ref <150)

## 2020-10-13 LAB — COMPLETE METABOLIC PANEL WITH GFR
AG Ratio: 1 (calc) (ref 1.0–2.5)
ALT: 9 U/L (ref 9–46)
AST: 17 U/L (ref 10–35)
Albumin: 3.2 g/dL — ABNORMAL LOW (ref 3.6–5.1)
Alkaline phosphatase (APISO): 68 U/L (ref 35–144)
BUN/Creatinine Ratio: 14 (calc) (ref 6–22)
BUN: 16 mg/dL (ref 7–25)
CO2: 33 mmol/L — ABNORMAL HIGH (ref 20–32)
Calcium: 8.2 mg/dL — ABNORMAL LOW (ref 8.6–10.3)
Chloride: 104 mmol/L (ref 98–110)
Creat: 1.13 mg/dL — ABNORMAL HIGH (ref 0.70–1.11)
GFR, Est African American: 70 mL/min/{1.73_m2} (ref >=60)
GFR, Est Non African American: 60 mL/min/{1.73_m2} (ref >=60)
Globulin: 3.2 g/dL (calc) (ref 1.9–3.7)
Glucose, Bld: 85 mg/dL (ref 65–99)
Potassium: 3.8 mmol/L (ref 3.5–5.3)
Sodium: 144 mmol/L (ref 135–146)
Total Bilirubin: 0.3 mg/dL (ref 0.2–1.2)
Total Protein: 6.4 g/dL (ref 6.1–8.1)

## 2020-10-17 ENCOUNTER — Ambulatory Visit: Payer: Medicare PPO | Admitting: Podiatry

## 2020-10-18 DIAGNOSIS — I130 Hypertensive heart and chronic kidney disease with heart failure and stage 1 through stage 4 chronic kidney disease, or unspecified chronic kidney disease: Secondary | ICD-10-CM

## 2020-10-18 DIAGNOSIS — D631 Anemia in chronic kidney disease: Secondary | ICD-10-CM

## 2020-10-18 DIAGNOSIS — I739 Peripheral vascular disease, unspecified: Secondary | ICD-10-CM

## 2020-10-18 DIAGNOSIS — F0391 Unspecified dementia with behavioral disturbance: Secondary | ICD-10-CM | POA: Diagnosis not present

## 2020-10-18 DIAGNOSIS — J449 Chronic obstructive pulmonary disease, unspecified: Secondary | ICD-10-CM

## 2020-10-18 DIAGNOSIS — I5032 Chronic diastolic (congestive) heart failure: Secondary | ICD-10-CM

## 2020-10-18 DIAGNOSIS — I480 Paroxysmal atrial fibrillation: Secondary | ICD-10-CM

## 2020-10-18 DIAGNOSIS — N1832 Chronic kidney disease, stage 3b: Secondary | ICD-10-CM

## 2020-10-19 ENCOUNTER — Telehealth: Payer: Self-pay | Admitting: *Deleted

## 2020-10-19 NOTE — Telephone Encounter (Signed)
Michelle with [facility] called requesting an FL2 form to be filled out for a [place].   Family and patient are looking into a more appropriate facility for his needs.   Please Advise.  (Forwarded to [employer] since [name] is out of office.)   To be faxed to [facility] once completed Fax: [phone]

## 2020-10-22 NOTE — Telephone Encounter (Signed)
FL2 Form Filled out and placed in Dr. Ammie Ferrier folder to review and sign.

## 2020-10-22 NOTE — Telephone Encounter (Signed)
Please fill out FL 2 then will have Dr.Miller sign tomorrow while he is at the office.

## 2020-10-24 NOTE — Telephone Encounter (Signed)
Dr. Sabra Heck wanted me to call and ask the Patient information questions on FL2 form because he has never seen patient.   Called and spoke with Precious Reel with [facility] and she helped me answer most questions. She is going to check with her Clinician for the remaining questions and call me back.  Awaiting callback.

## 2020-10-24 NOTE — Telephone Encounter (Addendum)
Spoke with Precious Reel with [facility] [phone] and she helped with filling out the FL2 Form.  Dr. Sabra Heck signed and faxed to [facility] Fax: [phone]  Copy sent for scanning.

## 2020-10-30 ENCOUNTER — Ambulatory Visit: Payer: Medicare PPO | Admitting: Podiatry

## 2020-12-25 ENCOUNTER — Other Ambulatory Visit: Payer: Self-pay | Admitting: Nurse Practitioner

## 2021-01-18 ENCOUNTER — Other Ambulatory Visit: Payer: Self-pay

## 2021-01-18 ENCOUNTER — Encounter: Payer: Self-pay | Admitting: Nurse Practitioner

## 2021-01-18 ENCOUNTER — Ambulatory Visit (INDEPENDENT_AMBULATORY_CARE_PROVIDER_SITE_OTHER): Payer: Medicare PPO | Admitting: Nurse Practitioner

## 2021-01-18 VITALS — BP 114/60 | HR 60 | Temp 97.5°F | Ht 68.0 in | Wt 110.0 lb

## 2021-01-18 DIAGNOSIS — I10 Essential (primary) hypertension: Secondary | ICD-10-CM

## 2021-01-18 DIAGNOSIS — E782 Mixed hyperlipidemia: Secondary | ICD-10-CM | POA: Diagnosis not present

## 2021-01-18 DIAGNOSIS — D696 Thrombocytopenia, unspecified: Secondary | ICD-10-CM

## 2021-01-18 DIAGNOSIS — F03918 Unspecified dementia, unspecified severity, with other behavioral disturbance: Secondary | ICD-10-CM

## 2021-01-18 DIAGNOSIS — I739 Peripheral vascular disease, unspecified: Secondary | ICD-10-CM

## 2021-01-18 DIAGNOSIS — N184 Chronic kidney disease, stage 4 (severe): Secondary | ICD-10-CM

## 2021-01-18 DIAGNOSIS — E440 Moderate protein-calorie malnutrition: Secondary | ICD-10-CM

## 2021-01-18 DIAGNOSIS — I480 Paroxysmal atrial fibrillation: Secondary | ICD-10-CM

## 2021-01-18 DIAGNOSIS — I5032 Chronic diastolic (congestive) heart failure: Secondary | ICD-10-CM | POA: Diagnosis not present

## 2021-01-18 DIAGNOSIS — Z23 Encounter for immunization: Secondary | ICD-10-CM

## 2021-01-18 DIAGNOSIS — R569 Unspecified convulsions: Secondary | ICD-10-CM

## 2021-01-18 NOTE — Progress Notes (Signed)
Careteam: Patient Care Team: [name], NP as PCP - General (Nurse Practitioner)  PLACE OF SERVICE:  [place] Directive information Does Patient Have a Medical Advance Directive?: Yes, Type of Advance Directive: Out of facility DNR (pink MOST or yellow form), Pre-existing out of facility DNR order (yellow form or pink MOST form): Yellow form placed in chart (order not valid for inpatient use);Pink MOST form placed in chart (order not valid for inpatient use), Does patient want to make changes to medical advance directive?: No - Patient declined  Allergies  Allergen Reactions   Diltiazem Rash and Other (See Comments)    Blisters   Penicillins Other (See Comments)    Unknown Childhood allergy  Has patient had a PCN reaction causing immediate rash, facial/tongue/throat swelling, SOB or lightheadedness with hypotension: unknown Has patient had a PCN reaction causing severe rash involving mucus membranes or skin necrosis: unknown Has patient had a PCN reaction that required hospitalization unknown Has patient had a PCN reaction occurring within the last 10 years:no If all of the above answers are "NO", then may proceed with Cephalosporin use.     Chief Complaint  Patient presents with   Medical Management of Chronic Issues    3 month follow-up. Care gaps reviewed, discuss need for shingrix, td/tdap, and covid, or postpone/exclude if patient refuses. Patient not eating well per sister. Examine left lower leg for discoloration. Discuss overall care. Flu vaccine today       HPI: Patient is a 80 y.o. male for routine follow up.  Here with sister Stanton Kidney today.  Home health signed off. Sister reports she has not looked into anything yet.  She wants [place] to fill out a form but unsure what type of form. She talked to the Education officer, museum but did not move fwd with anything.   No longer followed by neurology, no recent seizures.   He is not eating well. He will eat a few  bites and then say he's had enough. Has not had medication today.  Lives with his brother but does not help him. Sister had to get him up and ready for appt when she got to his house. She needs help.   He is on his own with him medication- dose not take routinely. Sister can call him but a lot of the time he does not answer or says he is taking medication but not taking it.   Sister reports his bathroom has "mess all in it" and needing to be cleaned daily. She reports she tries to get over there several times a week to clean.  He will not allow anyone to bathe him. Reports she tired to get her son over to help him but he turns him away.  She does tries to clean his clothes but they get mixed in with dirty. Sister does her best to do his laundry   Review of Systems:  Review of Systems  Unable to perform ROS: Dementia   Past Medical History:  Diagnosis Date   Alcohol abuse    Anemia    Aneurysm ([place])    CKD (chronic kidney disease), stage III (HCC)    COPD (chronic obstructive pulmonary disease) (HCC)    Dysrhythmia    Fever of unknown origin 08/2016   History of atrial fibrillation    Hyperchloremia    Hypercholesteremia    Hyperpotassemia    Hypertension    Hypertensive renal disease, benign    Leg edema  PAD (peripheral artery disease) (HCC)    Pneumonia    Pressure ulcer of left heel    Seizures ([place])    has not had a seizure in 15 yrs   Tobacco use    Weight loss    Past Surgical History:  Procedure Laterality Date   ABDOMINAL AORTOGRAM W/LOWER EXTREMITY N/A 08/13/2016   Procedure: Abdominal Aortogram w/Lower Extremity;  Surgeon: Wellington Hampshire, MD;  Location: [facility];  Service: Cardiovascular;  Laterality: N/A;   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, 1996   EYE SURGERY     December 2017   PERIPHERAL VASCULAR BALLOON ANGIOPLASTY  08/13/2016   Procedure: Peripheral Vascular Balloon Angioplasty;  Surgeon: Wellington Hampshire, MD;  Location: [facility];   Service: Cardiovascular;;  Aborted   Social History:   reports that he has been smoking cigarettes. He has never used smokeless tobacco. He reports that he does not currently use alcohol. He reports that he does not use drugs.  Family History  Problem Relation Age of Onset   Cancer Mother    Congestive Heart Failure Father    Alzheimer's disease Sister    Congestive Heart Failure Sister    Clotting disorder Sister    Congestive Heart Failure Brother    Clotting disorder Sister     Medications: Patient's Medications  New Prescriptions   No medications on file  Previous Medications   ALBUTEROL (PROVENTIL HFA;VENTOLIN HFA) 108 (90 BASE) MCG/ACT INHALER    Inhale 2 puffs into the lungs every 6 (six) hours as needed for wheezing or shortness of breath.   AMLODIPINE (NORVASC) 10 MG TABLET    Take 1 tablet (10 mg total) by mouth daily.   ASPIRIN EC 81 MG TABLET    Take 1 tablet (81 mg total) by mouth daily.   ATORVASTATIN (LIPITOR) 20 MG TABLET    TAKE 1 TABLET BY MOUTH EVERY DAY   CARBOXYMETHYLCELLULOSE SODIUM (EYE DROPS OP)    Apply 1 drop to eye daily as needed (dry eyes).   CARVEDILOL (COREG) 3.125 MG TABLET    Take 1 tablet (3.125 mg total) by mouth 2 (two) times daily with a meal. I48.0   CYANOCOBALAMIN (VITAMIN B 12 PO)    Take 1 tablet by mouth daily.   DIVALPROEX (DEPAKOTE) 250 MG DR TABLET    TAKE 1 TABLET BY MOUTH THREE TIMES A DAY AS NEEDED FOR SEIZURES   HYDRALAZINE (APRESOLINE) 50 MG TABLET    Take 1 tablet (50 mg total) by mouth 3 (three) times daily.   LEVETIRACETAM (KEPPRA) 500 MG TABLET    TAKE 1 TABLET BY MOUTH TWICE A DAY   MEMANTINE (NAMENDA) 10 MG TABLET    TAKE 1 TABLET BY MOUTH TWICE A DAY   MULTIPLE VITAMINS-MINERALS (THEREMS-M) TABS    TAKE 1 TABLET BY MOUTH EVERY DAY   SODIUM ZIRCONIUM CYCLOSILICATE (LOKELMA) 10 G PACK PACKET    Take 10 g by mouth daily.   THIAMINE (CVS B-1) 100 MG TABLET    Take 1 tablet (100 mg total) by mouth daily.  Modified Medications    No medications on file  Discontinued Medications   No medications on file    Physical Exam:  Vitals:   01/18/21 1104  BP: 114/60  Pulse: 60  Temp: (!) 97.5 F (36.4 C)  TempSrc: Temporal  SpO2: 95%  Weight: 110 lb (49.9 kg)  Height: $Remove'5\' 8"'mGzpjCK$  (1.727 m)   Body mass index is 16.73 kg/m. Wt  Readings from Last 3 Encounters:  01/18/21 110 lb (49.9 kg)  10/12/20 121 lb 6.4 oz (55.1 kg)  08/21/20 132 lb 3.2 oz (60 kg)    Physical Exam Constitutional:      General: He is not in acute distress.    Appearance: He is well-developed. He is not diaphoretic.     Comments: Thin frail male  HENT:     Head: Normocephalic and atraumatic.     Right Ear: External ear normal.     Left Ear: External ear normal.     Mouth/Throat:     Pharynx: No oropharyngeal exudate.  Eyes:     Conjunctiva/sclera: Conjunctivae normal.     Pupils: Pupils are equal, round, and reactive to light.  Cardiovascular:     Rate and Rhythm: Normal rate and regular rhythm.     Heart sounds: Murmur heard.  Pulmonary:     Effort: Pulmonary effort is normal.     Breath sounds: Normal breath sounds.  Abdominal:     General: Bowel sounds are normal.     Palpations: Abdomen is soft.  Musculoskeletal:        General: No tenderness.     Cervical back: Normal range of motion and neck supple.     Right lower leg: No edema.     Left lower leg: No edema.  Skin:    General: Skin is warm and dry.  Neurological:     Mental Status: He is alert.     Motor: Weakness present.     Gait: Gait abnormal.    Labs reviewed: Basic Metabolic Panel: Recent Labs    02/20/20 1201 02/27/20 0934 03/05/20 0000 03/09/20 0920 10/12/20 1121  NA 141   < > 136 142 144  K 5.6*   < > 5.7* 4.9 3.8  CL 109   < > 106 108 104  CO2 26   < > 24 30 33*  GLUCOSE 87   < > 89 78 85  BUN 28*   < > _0 CREATININE 1.24*   < > 1.54* 1.18* 1.13*  CALCIUM 8.5*   < > 8.8 8.0* 8.2*  TSH 1.10  --   --   --   --    < > = values in this  interval not displayed.   Liver Function Tests: Recent Labs    02/20/20 1201 10/12/20 1121  AST 16 17  ALT 7* 9  BILITOT 0.2 0.3  PROT 6.4 6.4   No results for input(s): LIPASE, AMYLASE in the last 8760 hours. No results for input(s): AMMONIA in the last 8760 hours. CBC: Recent Labs    02/20/20 1201 10/12/20 1121  WBC 6.3 7.8  NEUTROABS 2,867 4,969  HGB 11.0* 10.4*  HCT 34.6* 33.0*  MCV 92.8 91.7  PLT 137* 135*   Lipid Panel: Recent Labs    10/12/20 1121  CHOL 164  HDL 42  LDLCALC 102*  TRIG 104  CHOLHDL 3.9   TSH: Recent Labs    02/20/20 1201  TSH 1.10   A1C: Lab Results  Component Value Date   HGBA1C 5.2 04/15/2016     Assessment/Plan 1. Need for influenza vaccination - Flu Vaccine QUAD High Dose(Fluad)  2. Essential hypertension, benign -well controlled today, sister states he has not taken morning medications. Question what medication he is taking, has not had refill on norvasc filled since 2020, will dc from medication list.  - CBC with Differential/Platelet - CMP with eGFR([facility])  3.  Mixed hyperlipidemia Continues on lipitor. LDL at goal.   4. Chronic diastolic heart failure (HCC) -euvolemic, continues on coreg  5. Paroxysmal atrial fibrillation (HCC) Not on anticoagulation due to high fall risk, hx of ETOH abuse. Continues on coreg for rate control.  - CBC with Differential/Platelet  6. Seizures ([place]) Has not follow up with neurology. No recent seizures.  Sister reports  he is taking medication as prescribed . - Valproic Acid Level  7. Dementia with behavioral disturbance -progressive decline. Sister is looking for help with getting him placement. Home health SW gave her some information but she was unable to follow through with getting additional help/facility placement. She is requesting any help she can get as he is too much for her to be able to take care of.  Called [facility] and spoke with Arnetha Massy, plans to follow up with sister  to help with placement.  Continues on namenda 10 mg BID  8. Moderate protein-calorie malnutrition ([place]) Sister reports he is not eating regularly. She tries to get him to eat but he will only take a few bites and then hes done. He has lost 20 lbs since 5/22. Encouraged nutritional supplement daily   9. Chronic kidney disease, stage 4 (severe) (HCC) -Chronic and stable Encourage proper hydration Follow metabolic panel Avoid nephrotoxic meds (NSAIDS) - CMP with eGFR([facility])  10. Thrombocytopenia (HCC) - CBC with Differential/Platelet  11. PVD (peripheral vascular disease) (HCC) Stable, no sores or skin breakdown    Next appt: 3 months.  Carlos American. [place], [facility] [phone]

## 2021-01-18 NOTE — Patient Instructions (Signed)
To call with name of the form.

## 2021-01-19 LAB — COMPLETE METABOLIC PANEL WITH GFR
AG Ratio: 1.4 (calc) (ref 1.0–2.5)
ALT: 10 U/L (ref 9–46)
AST: 20 U/L (ref 10–35)
Albumin: 3.6 g/dL (ref 3.6–5.1)
Alkaline phosphatase (APISO): 63 U/L (ref 35–144)
BUN/Creatinine Ratio: 27 (calc) — ABNORMAL HIGH (ref 6–22)
BUN: 28 mg/dL — ABNORMAL HIGH (ref 7–25)
CO2: 27 mmol/L (ref 20–32)
Calcium: 8.8 mg/dL (ref 8.6–10.3)
Chloride: 106 mmol/L (ref 98–110)
Creat: 1.05 mg/dL (ref 0.70–1.22)
Globulin: 2.6 g/dL (calc) (ref 1.9–3.7)
Glucose, Bld: 82 mg/dL (ref 65–139)
Potassium: 5.2 mmol/L (ref 3.5–5.3)
Sodium: 138 mmol/L (ref 135–146)
Total Bilirubin: 0.2 mg/dL (ref 0.2–1.2)
Total Protein: 6.2 g/dL (ref 6.1–8.1)
eGFR: 71 mL/min/{1.73_m2} (ref >=60)

## 2021-01-19 LAB — CBC WITH DIFFERENTIAL/PLATELET
Absolute Monocytes: 577 cells/uL (ref 200–950)
Basophils Absolute: 22 cells/uL (ref 0–200)
Basophils Relative: 0.4 %
Eosinophils Absolute: 28 cells/uL (ref 15–500)
Eosinophils Relative: 0.5 %
HCT: 33.3 % — ABNORMAL LOW (ref 38.5–50.0)
Hemoglobin: 10.6 g/dL — ABNORMAL LOW (ref 13.2–17.1)
Lymphs Abs: 2134 cells/uL (ref 850–3900)
MCH: 29.8 pg (ref 27.0–33.0)
MCHC: 31.8 g/dL — ABNORMAL LOW (ref 32.0–36.0)
MCV: 93.5 fL (ref 80.0–100.0)
MPV: 11.7 fL (ref 7.5–12.5)
Monocytes Relative: 10.3 %
Neutro Abs: 2839 cells/uL (ref 1500–7800)
Neutrophils Relative %: 50.7 %
Platelets: 166 10*3/uL (ref 140–400)
RBC: 3.56 10*6/uL — ABNORMAL LOW (ref 4.20–5.80)
RDW: 14.3 % (ref 11.0–15.0)
Total Lymphocyte: 38.1 %
WBC: 5.6 10*3/uL (ref 3.8–10.8)

## 2021-01-19 LAB — VALPROIC ACID LEVEL: Valproic Acid Lvl: 50.6 mg/L (ref 50.0–100.0)

## 2021-01-29 ENCOUNTER — Telehealth: Payer: Self-pay | Admitting: Nurse Practitioner

## 2021-01-29 NOTE — Telephone Encounter (Signed)
[facility] intake line and left a message for someone to return call regarding patient.  Awaiting callback.

## 2021-01-29 NOTE — Telephone Encounter (Signed)
Ms Jeneen Rinks calling for Mr Teichert wanting to know why she hasn't heard from social services on placement facilities for Mr [place]. How long does it take? Who does she contact?  Please advise Thanks, Vilinda Blanks

## 2021-02-01 ENCOUNTER — Telehealth: Payer: Self-pay | Admitting: *Deleted

## 2021-02-01 NOTE — Telephone Encounter (Signed)
error 

## 2021-03-12 ENCOUNTER — Other Ambulatory Visit: Payer: Self-pay | Admitting: Nurse Practitioner

## 2021-04-04 ENCOUNTER — Other Ambulatory Visit: Payer: Self-pay | Admitting: Nurse Practitioner

## 2021-05-14 DIAGNOSIS — H2511 Age-related nuclear cataract, right eye: Secondary | ICD-10-CM | POA: Diagnosis not present

## 2021-05-14 DIAGNOSIS — H40023 Open angle with borderline findings, high risk, bilateral: Secondary | ICD-10-CM | POA: Diagnosis not present

## 2021-05-14 DIAGNOSIS — S0501XA Injury of conjunctiva and corneal abrasion without foreign body, right eye, initial encounter: Secondary | ICD-10-CM | POA: Diagnosis not present

## 2021-05-31 ENCOUNTER — Encounter: Payer: Self-pay | Admitting: Nurse Practitioner

## 2021-05-31 ENCOUNTER — Other Ambulatory Visit: Payer: Self-pay

## 2021-05-31 ENCOUNTER — Ambulatory Visit (INDEPENDENT_AMBULATORY_CARE_PROVIDER_SITE_OTHER): Payer: Medicare PPO | Admitting: Nurse Practitioner

## 2021-05-31 VITALS — BP 126/80 | HR 70 | Temp 97.9°F | Ht 68.0 in | Wt 113.0 lb

## 2021-05-31 DIAGNOSIS — F03918 Unspecified dementia, unspecified severity, with other behavioral disturbance: Secondary | ICD-10-CM

## 2021-05-31 DIAGNOSIS — I739 Peripheral vascular disease, unspecified: Secondary | ICD-10-CM | POA: Diagnosis not present

## 2021-05-31 DIAGNOSIS — E782 Mixed hyperlipidemia: Secondary | ICD-10-CM | POA: Diagnosis not present

## 2021-05-31 DIAGNOSIS — I10 Essential (primary) hypertension: Secondary | ICD-10-CM | POA: Diagnosis not present

## 2021-05-31 DIAGNOSIS — E440 Moderate protein-calorie malnutrition: Secondary | ICD-10-CM

## 2021-05-31 DIAGNOSIS — N184 Chronic kidney disease, stage 4 (severe): Secondary | ICD-10-CM | POA: Diagnosis not present

## 2021-05-31 DIAGNOSIS — R569 Unspecified convulsions: Secondary | ICD-10-CM | POA: Diagnosis not present

## 2021-05-31 DIAGNOSIS — L602 Onychogryphosis: Secondary | ICD-10-CM | POA: Diagnosis not present

## 2021-05-31 LAB — CBC WITH DIFFERENTIAL/PLATELET
Absolute Monocytes: 597 cells/uL (ref 200–950)
Basophils Absolute: 31 cells/uL (ref 0–200)
Basophils Relative: 0.6 %
Eosinophils Absolute: 71 cells/uL (ref 15–500)
Eosinophils Relative: 1.4 %
HCT: 34.8 % — ABNORMAL LOW (ref 38.5–50.0)
Hemoglobin: 11.5 g/dL — ABNORMAL LOW (ref 13.2–17.1)
Lymphs Abs: 2560 cells/uL (ref 850–3900)
MCH: 29.8 pg (ref 27.0–33.0)
MCHC: 33 g/dL (ref 32.0–36.0)
MCV: 90.2 fL (ref 80.0–100.0)
MPV: 11.9 fL (ref 7.5–12.5)
Monocytes Relative: 11.7 %
Neutro Abs: 1841 cells/uL (ref 1500–7800)
Neutrophils Relative %: 36.1 %
Platelets: 162 10*3/uL (ref 140–400)
RBC: 3.86 10*6/uL — ABNORMAL LOW (ref 4.20–5.80)
RDW: 12.8 % (ref 11.0–15.0)
Total Lymphocyte: 50.2 %
WBC: 5.1 10*3/uL (ref 3.8–10.8)

## 2021-05-31 LAB — COMPLETE METABOLIC PANEL WITH GFR
AG Ratio: 1.3 (calc) (ref 1.0–2.5)
ALT: 13 U/L (ref 9–46)
AST: 22 U/L (ref 10–35)
Albumin: 3.9 g/dL (ref 3.6–5.1)
Alkaline phosphatase (APISO): 76 U/L (ref 35–144)
BUN/Creatinine Ratio: 11 (calc) (ref 6–22)
BUN: 17 mg/dL (ref 7–25)
CO2: 26 mmol/L (ref 20–32)
Calcium: 8.9 mg/dL (ref 8.6–10.3)
Chloride: 106 mmol/L (ref 98–110)
Creat: 1.57 mg/dL — ABNORMAL HIGH (ref 0.70–1.22)
Globulin: 3.1 g/dL (calc) (ref 1.9–3.7)
Glucose, Bld: 93 mg/dL (ref 65–139)
Potassium: 5 mmol/L (ref 3.5–5.3)
Sodium: 138 mmol/L (ref 135–146)
Total Bilirubin: 0.2 mg/dL (ref 0.2–1.2)
Total Protein: 7 g/dL (ref 6.1–8.1)
eGFR: 44 mL/min/{1.73_m2} — ABNORMAL LOW (ref >=60)

## 2021-05-31 LAB — LIPID PANEL
Cholesterol: 148 mg/dL (ref <200)
HDL: 46 mg/dL (ref >=40)
LDL Cholesterol (Calc): 83 mg/dL (calc)
Non-HDL Cholesterol (Calc): 102 mg/dL (calc) (ref <130)
Total CHOL/HDL Ratio: 3.2 (calc) (ref <5.0)
Triglycerides: 96 mg/dL (ref <150)

## 2021-05-31 NOTE — Progress Notes (Signed)
Careteam: Patient Care Team: [name], NP as PCP - General (Nurse Practitioner)  PLACE OF SERVICE:  [place] Directive information Does Patient Have a Medical Advance Directive?: Yes, Type of Advance Directive: Out of facility DNR (pink MOST or yellow form), Pre-existing out of facility DNR order (yellow form or pink MOST form): Yellow form placed in chart (order not valid for inpatient use);Pink MOST form placed in chart (order not valid for inpatient use), Does patient want to make changes to medical advance directive?: No - Patient declined  Allergies  Allergen Reactions   Diltiazem Rash and Other (See Comments)    Blisters   Penicillins Other (See Comments)    Unknown Childhood allergy  Has patient had a PCN reaction causing immediate rash, facial/tongue/throat swelling, SOB or lightheadedness with hypotension: unknown Has patient had a PCN reaction causing severe rash involving mucus membranes or skin necrosis: unknown Has patient had a PCN reaction that required hospitalization unknown Has patient had a PCN reaction occurring within the last 10 years:no If all of the above answers are "NO", then may proceed with Cephalosporin use.     Chief Complaint  Patient presents with   Medical Management of Chronic Issues    3 month follow-up. Discuss need for shingrix and td/tdap or post pone if patient refuses. Patient denies receiving any vaccines since last visit. Here with sister. Patients niece manages his medications, will check AVS, and call if anything is different than what patient has at home.       HPI: Patient is a 81 y.o. male for routine follow up. No acute issues noted today.   Stanton Kidney (sister) is still trying to find more help for Mr Loman. She had SW with home health helping her and APS was contacting but when she followed up they states they do not take every referral.   No recent seizures. Takes medication as prescribed. Stanton Kidney calls him to  remind him of medicine.   He will refuse personal care, his family will come in and help him with personal care but he will refuse.   He has increased weight 3 lbs from last appt.   Review of Systems:  Review of Systems  Constitutional:  Negative for chills, fever and weight loss.  HENT:  Negative for tinnitus.   Respiratory:  Negative for cough, sputum production and shortness of breath.   Cardiovascular:  Negative for chest pain, palpitations and leg swelling.  Gastrointestinal:  Negative for abdominal pain, constipation, diarrhea and heartburn.  Genitourinary:  Negative for dysuria, frequency and urgency.  Musculoskeletal:  Negative for back pain, falls, joint pain and myalgias.  Skin: Negative.   Neurological:  Negative for dizziness and headaches.  Psychiatric/Behavioral:  Positive for memory loss. Negative for depression. The patient does not have insomnia.  =  Past Medical History:  Diagnosis Date   Alcohol abuse    Anemia    Aneurysm (HCC)    CKD (chronic kidney disease), stage III (HCC)    COPD (chronic obstructive pulmonary disease) (HCC)    Dysrhythmia    Fever of unknown origin 08/2016   History of atrial fibrillation    Hyperchloremia    Hypercholesteremia    Hyperpotassemia    Hypertension    Hypertensive renal disease, benign    Leg edema    PAD (peripheral artery disease) (HCC)    Pneumonia    Pressure ulcer of left heel    Seizures ([place])    has  not had a seizure in 15 yrs   Tobacco use    Weight loss    Past Surgical History:  Procedure Laterality Date   ABDOMINAL AORTOGRAM W/LOWER EXTREMITY N/A 08/13/2016   Procedure: Abdominal Aortogram w/Lower Extremity;  Surgeon: Wellington Hampshire, MD;  Location: [facility];  Service: Cardiovascular;  Laterality: N/A;   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, 1996   EYE SURGERY     December 2017   PERIPHERAL VASCULAR BALLOON ANGIOPLASTY  08/13/2016   Procedure: Peripheral Vascular Balloon Angioplasty;  Surgeon:  Wellington Hampshire, MD;  Location: [facility];  Service: Cardiovascular;;  Aborted   Social History:   reports that he has been smoking cigarettes. He has never used smokeless tobacco. He reports that he does not currently use alcohol. He reports that he does not use drugs.  Family History  Problem Relation Age of Onset   Cancer Mother    Congestive Heart Failure Father    Alzheimer's disease Sister    Congestive Heart Failure Sister    Clotting disorder Sister    Congestive Heart Failure Brother    Clotting disorder Sister     Medications: Patient's Medications  New Prescriptions   No medications on file  Previous Medications   ALBUTEROL (PROVENTIL HFA;VENTOLIN HFA) 108 (90 BASE) MCG/ACT INHALER    Inhale 2 puffs into the lungs every 6 (six) hours as needed for wheezing or shortness of breath.   ASPIRIN EC 81 MG TABLET    Take 1 tablet (81 mg total) by mouth daily.   ATORVASTATIN (LIPITOR) 20 MG TABLET    TAKE 1 TABLET BY MOUTH EVERY DAY   CARBOXYMETHYLCELLULOSE SODIUM (EYE DROPS OP)    Apply 1 drop to eye daily as needed (dry eyes).   CARVEDILOL (COREG) 3.125 MG TABLET    Take 1 tablet (3.125 mg total) by mouth 2 (two) times daily with a meal. I48.0   CYANOCOBALAMIN (VITAMIN B 12 PO)    Take 1 tablet by mouth daily.   DIVALPROEX (DEPAKOTE) 250 MG DR TABLET    TAKE 1 TABLET BY MOUTH THREE TIMES A DAY AS NEEDED FOR SEIZURES   HYDRALAZINE (APRESOLINE) 50 MG TABLET    Take 1 tablet (50 mg total) by mouth 3 (three) times daily.   LEVETIRACETAM (KEPPRA) 500 MG TABLET    TAKE 1 TABLET BY MOUTH TWICE A DAY   MEMANTINE (NAMENDA) 10 MG TABLET    TAKE 1 TABLET BY MOUTH TWICE A DAY   MULTIPLE VITAMINS-MINERALS (THEREMS-M) TABS    TAKE 1 TABLET BY MOUTH EVERY DAY   SODIUM ZIRCONIUM CYCLOSILICATE (LOKELMA) 10 G PACK PACKET    Take 10 g by mouth daily.   THIAMINE ([facility]S B-1) 100 MG TABLET    Take 1 tablet (100 mg total) by mouth daily.  Modified Medications   No medications on file   Discontinued Medications   No medications on file    Physical Exam:  Vitals:   05/31/21 1533  BP: 126/80  Pulse: 70  Temp: 97.9 F (36.6 C)  TempSrc: Temporal  SpO2: 99%  Weight: 113 lb (51.3 kg)  Height: 5' 8" (1.727 m)   Body mass index is 17.18 kg/m. Wt Readings from Last 3 Encounters:  05/31/21 113 lb (51.3 kg)  01/18/21 110 lb (49.9 kg)  10/12/20 121 lb 6.4 oz (55.1 kg)    Physical Exam Constitutional:      General: He is not in acute distress.  Appearance: He is well-developed. He is not diaphoretic.  HENT:     Head: Normocephalic and atraumatic.     Right Ear: External ear normal.     Left Ear: External ear normal.     Mouth/Throat:     Pharynx: No oropharyngeal exudate.  Eyes:     Conjunctiva/sclera: Conjunctivae normal.     Pupils: Pupils are equal, round, and reactive to light.  Cardiovascular:     Rate and Rhythm: Normal rate and regular rhythm.     Heart sounds: Normal heart sounds.  Pulmonary:     Effort: Pulmonary effort is normal.     Breath sounds: Normal breath sounds.  Abdominal:     General: Bowel sounds are normal.     Palpations: Abdomen is soft.  Musculoskeletal:        General: No tenderness.     Cervical back: Normal range of motion and neck supple.     Right lower leg: No edema.     Left lower leg: No edema.  Feet:     Right foot:     Toenail Condition: Right toenails are abnormally thick and long. Fungal disease present.    Left foot:     Toenail Condition: Left toenails are abnormally thick and long. Fungal disease present. Skin:    General: Skin is warm and dry.  Neurological:     Mental Status: He is alert and oriented to person, place, and time.    Labs reviewed: Basic Metabolic Panel: Recent Labs    10/12/20 1121 01/18/21 1140  NA 144 138  K 3.8 5.2  CL 104 106  CO2 33* 27  GLUCOSE 85 82  BUN 16 28*  CREATININE 1.13* 1.05  CALCIUM 8.2* 8.8   Liver Function Tests: Recent Labs    10/12/20 1121  01/18/21 1140  AST 17 20  ALT 9 10  BILITOT 0.3 0.2  PROT 6.4 6.2   No results for input(s): LIPASE, AMYLASE in the last 8760 hours. No results for input(s): AMMONIA in the last 8760 hours. CBC: Recent Labs    10/12/20 1121 01/18/21 1140  WBC 7.8 5.6  NEUTROABS 4,969 2,839  HGB 10.4* 10.6*  HCT 33.0* 33.3*  MCV 91.7 93.5  PLT 135* 166   Lipid Panel: Recent Labs    10/12/20 1121  CHOL 164  HDL 42  LDLCALC 102*  TRIG 104  CHOLHDL 3.9   TSH: No results for input(s): TSH in the last 8760 hours. A1C: Lab Results  Component Value Date   HGBA1C 5.2 04/15/2016     Assessment/Plan 1. Essential hypertension, benign -Blood pressure well controlled Continue current medications Recheck metabolic panel - CBC with Differential/Platelet - CMP with eGFR([facility])  2. Mixed hyperlipidemia -continues on lipitor 20 mg daily  - Lipid panel - CMP with eGFR([facility])  3. Overgrown nail Pt and sister consistent for toe nails to be trimmed, Nails sharply debrided 10 without complication/bleeding  4. Seizures ([place]) No recent seizures. Continues on depakote   5. Dementia with behavioral disturbance -Stable, no acute changes in cognitive or functional status, continue supportive care with family, needs 24/7 care and sister trying to help provide this for him.  6. Moderate protein-calorie malnutrition ([place]) Weight is up, continue to liberalize diet. To have protein supplement in addition to smallest meal of the day.  7. Chronic kidney disease, stage 4 (severe) (HCC) -Chronic and stable Encourage proper hydration Follow metabolic panel Avoid nephrotoxic meds (NSAIDS)  8. PVD (peripheral vascular disease) (HCC) Stable,  without increase in pain, continues on asa 81 mg daily    Return in about 3 months (around 08/28/2021) for routine follow up. Carlos American. [place], [facility] [phone]

## 2021-06-04 ENCOUNTER — Telehealth: Payer: Self-pay | Admitting: *Deleted

## 2021-06-04 NOTE — Telephone Encounter (Signed)
Jeremy Lex, NP is calling wanting to speak with you directly concerning beginning placement arrangements for patient.   Stated that she would like for you to call her at [phone]

## 2021-06-07 NOTE — Telephone Encounter (Signed)
Called and spoke to [place], NP working to help get pt placement with SW ?

## 2021-07-11 ENCOUNTER — Other Ambulatory Visit: Payer: Self-pay

## 2021-07-11 ENCOUNTER — Emergency Department (HOSPITAL_COMMUNITY)
Admission: EM | Admit: 2021-07-11 | Discharge: 2021-07-11 | Disposition: A | Discharge status: Home / Self Care | Payer: Medicare PPO | Attending: Emergency Medicine | Admitting: Emergency Medicine

## 2021-07-11 ENCOUNTER — Emergency Department (HOSPITAL_COMMUNITY): Payer: Medicare PPO

## 2021-07-11 ENCOUNTER — Encounter (HOSPITAL_COMMUNITY): Payer: Self-pay

## 2021-07-11 DIAGNOSIS — R531 Weakness: Secondary | ICD-10-CM | POA: Insufficient documentation

## 2021-07-11 DIAGNOSIS — N189 Chronic kidney disease, unspecified: Secondary | ICD-10-CM | POA: Insufficient documentation

## 2021-07-11 DIAGNOSIS — F0390 Unspecified dementia without behavioral disturbance: Secondary | ICD-10-CM | POA: Insufficient documentation

## 2021-07-11 DIAGNOSIS — Z7982 Long term (current) use of aspirin: Secondary | ICD-10-CM | POA: Diagnosis not present

## 2021-07-11 DIAGNOSIS — J449 Chronic obstructive pulmonary disease, unspecified: Secondary | ICD-10-CM | POA: Insufficient documentation

## 2021-07-11 DIAGNOSIS — I959 Hypotension, unspecified: Secondary | ICD-10-CM | POA: Diagnosis not present

## 2021-07-11 LAB — VALPROIC ACID LEVEL: Valproic Acid Lvl: 32 ug/mL — ABNORMAL LOW (ref 50.0–100.0)

## 2021-07-11 LAB — COMPREHENSIVE METABOLIC PANEL
ALT: 19 U/L (ref 0–44)
AST: 26 U/L (ref 15–41)
Albumin: 3.7 g/dL (ref 3.5–5.0)
Alkaline Phosphatase: 65 U/L (ref 38–126)
Anion gap: 5 (ref 5–15)
BUN: 26 mg/dL — ABNORMAL HIGH (ref 8–23)
CO2: 25 mmol/L (ref 22–32)
Calcium: 8.7 mg/dL — ABNORMAL LOW (ref 8.9–10.3)
Chloride: 110 mmol/L (ref 98–111)
Creatinine, Ser: 1.33 mg/dL — ABNORMAL HIGH (ref 0.61–1.24)
GFR, Estimated: 53 mL/min — ABNORMAL LOW (ref >60)
Glucose, Bld: 100 mg/dL — ABNORMAL HIGH (ref 70–99)
Potassium: 5.1 mmol/L (ref 3.5–5.1)
Sodium: 140 mmol/L (ref 135–145)
Total Bilirubin: 0.7 mg/dL (ref 0.3–1.2)
Total Protein: 7.3 g/dL (ref 6.5–8.1)

## 2021-07-11 LAB — CBC WITH DIFFERENTIAL/PLATELET
Abs Immature Granulocytes: 0.02 10*3/uL (ref 0.00–0.07)
Basophils Absolute: 0 10*3/uL (ref 0.0–0.1)
Basophils Relative: 1 %
Eosinophils Absolute: 0.1 10*3/uL (ref 0.0–0.5)
Eosinophils Relative: 1 %
HCT: 34.7 % — ABNORMAL LOW (ref 39.0–52.0)
Hemoglobin: 11 g/dL — ABNORMAL LOW (ref 13.0–17.0)
Immature Granulocytes: 0 %
Lymphocytes Relative: 37 %
Lymphs Abs: 2.5 10*3/uL (ref 0.7–4.0)
MCH: 29.4 pg (ref 26.0–34.0)
MCHC: 31.7 g/dL (ref 30.0–36.0)
MCV: 92.8 fL (ref 80.0–100.0)
Monocytes Absolute: 1.1 10*3/uL — ABNORMAL HIGH (ref 0.1–1.0)
Monocytes Relative: 16 %
Neutro Abs: 3 10*3/uL (ref 1.7–7.7)
Neutrophils Relative %: 45 %
Platelets: 188 10*3/uL (ref 150–400)
RBC: 3.74 MIL/uL — ABNORMAL LOW (ref 4.22–5.81)
RDW: 15 % (ref 11.5–15.5)
WBC: 6.7 10*3/uL (ref 4.0–10.5)
nRBC: 0 % (ref 0.0–0.2)

## 2021-07-11 LAB — URINALYSIS, ROUTINE W REFLEX MICROSCOPIC
Bacteria, UA: NONE SEEN
Bilirubin Urine: NEGATIVE
Glucose, UA: NEGATIVE mg/dL
Hgb urine dipstick: NEGATIVE
Ketones, ur: NEGATIVE mg/dL
Nitrite: NEGATIVE
Protein, ur: NEGATIVE mg/dL
Specific Gravity, Urine: 1.017 (ref 1.005–1.030)
pH: 5 (ref 5.0–8.0)

## 2021-07-11 LAB — TROPONIN I (HIGH SENSITIVITY): Troponin I (High Sensitivity): 15 ng/L (ref <18)

## 2021-07-11 LAB — LIPASE, BLOOD: Lipase: 61 U/L — ABNORMAL HIGH (ref 11–51)

## 2021-07-11 MED ORDER — SODIUM CHLORIDE 0.9 % IV BOLUS
500.0000 mL | Freq: Once | INTRAVENOUS | Status: AC
  Administered 2021-07-11: 500 mL via INTRAVENOUS

## 2021-07-11 NOTE — ED Provider Notes (Signed)
?[facility] DEPT ?Provider Note ? ? ?CSN: [id number] ?Arrival date & time: 07/11/21  1832 ? ?  ? ?History ? ?Chief Complaint  ?Patient presents with  ? Weakness  ? ? ?[name] is a 81 y.o. male.  Level 5 caveat secondary to dementia .he has a history of cerebral aneurysm, seizure, CKD COPD.  He is brought in by ambulance from home where he lives with his sister.  Normally he is ambulatory and feisty.  Today his sister could not get him out of bed.  Patient himself denies any complaints and does not know why he is here.  He says he has not eaten today but he is hungry and he wants something.  He denies any headache chest pain cough vomiting diarrhea or urinary symptoms. ? ?HPI ? ?  ? ?Home Medications ?Prior to Admission medications   ?Medication Sig Start Date End Date Taking? Authorizing Provider  ?albuterol (PROVENTIL HFA;VENTOLIN HFA) 108 (90 Base) MCG/ACT inhaler Inhale 2 puffs into the lungs every 6 (six) hours as needed for wheezing or shortness of breath. 05/02/18   Bonnielee Haff, MD  ?aspirin EC 81 MG tablet Take 1 tablet (81 mg total) by mouth daily. 08/05/16   Wellington Hampshire, MD  ?atorvastatin (LIPITOR) 20 MG tablet TAKE 1 TABLET BY MOUTH EVERY DAY 04/04/21   [name], NP  ?Carboxymethylcellulose Sodium (EYE DROPS OP) Apply 1 drop to eye daily as needed (dry eyes).    [provider]  ?carvedilol (COREG) 3.125 MG tablet Take 1 tablet (3.125 mg total) by mouth 2 (two) times daily with a meal. I48.0 07/02/20   [name], NP  ?Cyanocobalamin (VITAMIN B 12 PO) Take 1 tablet by mouth daily.    [provider]  ?divalproex (DEPAKOTE) 250 MG DR tablet TAKE 1 TABLET BY MOUTH THREE TIMES A DAY AS NEEDED FOR SEIZURES 12/26/20   [name], NP  ?hydrALAZINE (APRESOLINE) 50 MG tablet Take 1 tablet (50 mg total) by mouth 3 (three) times daily. 08/21/20 01/18/49  Wellington Hampshire, MD  ?levETIRAcetam (KEPPRA) 500 MG tablet TAKE 1 TABLET BY MOUTH  TWICE A DAY 06/27/20   [name], NP  ?memantine (NAMENDA) 10 MG tablet TAKE 1 TABLET BY MOUTH TWICE A DAY 03/12/21   [name], NP  ?Multiple Vitamins-Minerals (THEREMS-M) TABS TAKE 1 TABLET BY MOUTH EVERY DAY 08/04/18   [name], NP  ?sodium zirconium cyclosilicate (LOKELMA) 10 g PACK packet Take 10 g by mouth daily. 03/05/20   [name], NP  ?thiamine (CVS B-1) 100 MG tablet Take 1 tablet (100 mg total) by mouth daily. 05/02/18   Bonnielee Haff, MD  ?   ? ?Allergies    ?Diltiazem and Penicillins   ? ?Review of Systems   ?Review of Systems  ?Unable to perform ROS: Dementia  ? ?Physical Exam ?Updated Vital Signs ?BP (!) 117/53   Pulse 65   Temp 98.2 ?F (36.8 ?C) (Oral)   Resp 17   Ht '5\' 4"'$  (1.626 m)   Wt 51 kg   SpO2 100%   BMI 19.30 kg/m?  ?Physical Exam ?Vitals and nursing note reviewed.  ?Constitutional:   ?   General: He is not in acute distress. ?   Appearance: Normal appearance. He is well-developed.  ?HENT:  ?   Head: Normocephalic.  ?   Comments: He has a skeletal defect on his upper forehead. ?Eyes:  ?   Conjunctiva/sclera: Conjunctivae normal.  ?  Cardiovascular:  ?   Rate and Rhythm: Normal rate and regular rhythm.  ?   Heart sounds: No murmur heard. ?Pulmonary:  ?   Effort: Pulmonary effort is normal. No respiratory distress.  ?   Breath sounds: Normal breath sounds.  ?Abdominal:  ?   Palpations: Abdomen is soft.  ?   Tenderness: There is no abdominal tenderness. There is no guarding or rebound.  ?Musculoskeletal:     ?   General: No swelling.  ?   Cervical back: Neck supple.  ?   Right lower leg: No edema.  ?   Left lower leg: No edema.  ?Skin: ?   General: Skin is warm and dry.  ?   Capillary Refill: Capillary refill takes less than 2 seconds.  ?Neurological:  ?   General: No focal deficit present.  ?   Mental Status: He is alert. Mental status is at baseline. He is disoriented.  ?Psychiatric:     ?   Mood and Affect: Mood normal.  ? ? ?ED Results / Procedures /  Treatments   ?Labs ?(all labs ordered are listed, but only abnormal results are displayed) ?Labs Reviewed  ?COMPREHENSIVE METABOLIC PANEL - Abnormal; Notable for the following components:  ?    Result Value  ? Glucose, Bld 100 (*)   ? BUN 26 (*)   ? Creatinine, Ser 1.33 (*)   ? Calcium 8.7 (*)   ? GFR, Estimated 53 (*)   ? All other components within normal limits  ?CBC WITH DIFFERENTIAL/PLATELET - Abnormal; Notable for the following components:  ? RBC 3.74 (*)   ? Hemoglobin 11.0 (*)   ? HCT 34.7 (*)   ? Monocytes Absolute 1.1 (*)   ? All other components within normal limits  ?URINALYSIS, ROUTINE W REFLEX MICROSCOPIC - Abnormal; Notable for the following components:  ? APPearance HAZY (*)   ? Leukocytes,Ua LARGE (*)   ? All other components within normal limits  ?LIPASE, BLOOD - Abnormal; Notable for the following components:  ? Lipase 61 (*)   ? All other components within normal limits  ?VALPROIC ACID LEVEL - Abnormal; Notable for the following components:  ? Valproic Acid Lvl 32 (*)   ? All other components within normal limits  ?TROPONIN I (HIGH SENSITIVITY)  ?TROPONIN I (HIGH SENSITIVITY)  ? ? ?EKG ?EKG Interpretation ? ?Date/Time:  Thursday July 11 2021 19:16:08 EDT ?Ventricular Rate:  67 ?PR Interval:  162 ?QRS Duration: 103 ?QT Interval:  417 ?QTC Calculation: 441 ?R Axis:   -41 ?Text Interpretation: Sinus rhythm Probable left atrial enlargement Left anterior fascicular block LVH with secondary repolarization abnormality Anterior infarct, old No significant change since prior 1/18 Confirmed by Aletta Edouard [phone]) on 07/11/2021 7:24:57 PM ? ?Radiology ?DG Chest Port 1 View ? ?Result Date: 07/11/2021 ?CLINICAL DATA:  Weakness. EXAM: PORTABLE CHEST 1 VIEW COMPARISON:  04/28/2018 FINDINGS: Heart size and pulmonary vascularity are normal. Right apical pleural calcification. Lungs are clear. No pleural effusions. No pneumothorax. Mediastinal contours appear intact. Calcification of the aorta. IMPRESSION: No  active disease. Electronically Signed   By: Lucienne Capers M.D.   On: 07/11/2021 19:13   ? ?Procedures ?Procedures  ? ? ?Medications Ordered in ED ?Medications  ?sodium chloride 0.9 % bolus 500 mL (0 mLs Intravenous Stopped 07/11/21 2136)  ? ? ?ED Course/ Medical Decision Making/ A&P ?Clinical Course as of 07/12/21 1005  ?Thu Jul 11, 2021  ?1916 Chest x-ray ordered and interpreted by me as no acute infiltrates.  Awaiting radiology reading. [MB]  ?1929 Patient's sister and brother are here now.  They said a caregiver was by today and she thought his blood pressure was low and needed to get checked out here.  They are not sure he is eating much and its been more longstanding that he is not allowing anybody to bathe him and he is refusing his medications at times.  They are interested in placement for him. [MB]  ?2057 I did put an order in for home health, social work let the family know. [MB]  ?  ?Clinical Course User Index ?[MB] Hayden Rasmussen, MD  ? ?                        ?Medical Decision Making ?Amount and/or Complexity of Data Reviewed ?Labs: ordered. ?Radiology: ordered. ? ?This patient complains of generalized weakness; this involves an extensive number of treatment ?Options and is a complaint that carries with it a high risk of complications and ?morbidity. The differential includes weakness, dehydration, infection, ACS ? ?I ordered, reviewed and interpreted labs, which included CBC with normal white count, hemoglobin low stable from priors, chemistries fairly unremarkable other than mild AKI similar to priors, troponins flat, Depakote subtherapeutic, urinalysis without signs of infection ?I ordered medication IV fluids and reviewed PMP when indicated. ?I ordered imaging studies which included chest x-ray and I independently ?   visualized and interpreted imaging which showed no acute findings ?Additional history obtained from patient's brother and sister ?Previous records obtained and reviewed in epic no  recent admissions ?Cardiac monitoring reviewed, normal sinus rhythm ?Social determinants considered, patient with dementia unable to advocate for self ? ?After the interventions stated above, I reevaluated th

## 2021-07-11 NOTE — ED Triage Notes (Signed)
Pt bib ems for increased weakness from home w/ home health care. Per family pt unable to get out of bed today which is unusual for pt.  Pt stated to ems he felt "under the weather". Pt hx dementia. ?

## 2021-07-11 NOTE — ED Notes (Signed)
Discharge instructions discussed with pt and caregivers, sister and brother at bedside. Pt/caregivers verbalized understanding with no questions at this time. Pt wheelchair to lobby ?

## 2021-07-11 NOTE — Discharge Instructions (Signed)
You were brought into the emergency department for evaluation of weakness.  You had lab work chest x-ray EKG and urinalysis that did not show any significant abnormalities.  Please drink plenty of fluids.  We also set up a home health evaluation and they should be calling you on the phone.  Follow-up with the primary care doctor.  Return to the emergency department if any worsening or concerning symptoms ?

## 2021-08-11 ENCOUNTER — Other Ambulatory Visit: Payer: Self-pay | Admitting: Nurse Practitioner

## 2021-08-11 DIAGNOSIS — I480 Paroxysmal atrial fibrillation: Secondary | ICD-10-CM

## 2021-08-17 ENCOUNTER — Other Ambulatory Visit: Payer: Self-pay | Admitting: Nurse Practitioner

## 2021-09-03 ENCOUNTER — Ambulatory Visit: Payer: Medicare PPO | Admitting: Nurse Practitioner

## 2021-09-06 ENCOUNTER — Ambulatory Visit: Payer: Medicare PPO | Admitting: Nurse Practitioner

## 2021-09-09 ENCOUNTER — Encounter: Payer: Self-pay | Admitting: Nurse Practitioner

## 2021-09-09 ENCOUNTER — Ambulatory Visit: Payer: Medicare PPO | Admitting: Nurse Practitioner

## 2021-09-09 VITALS — BP 134/82 | HR 82 | Ht 64.0 in | Wt 107.0 lb

## 2021-09-09 DIAGNOSIS — F03918 Unspecified dementia, unspecified severity, with other behavioral disturbance: Secondary | ICD-10-CM | POA: Diagnosis not present

## 2021-09-09 DIAGNOSIS — N184 Chronic kidney disease, stage 4 (severe): Secondary | ICD-10-CM | POA: Diagnosis not present

## 2021-09-09 DIAGNOSIS — R569 Unspecified convulsions: Secondary | ICD-10-CM

## 2021-09-09 DIAGNOSIS — L853 Xerosis cutis: Secondary | ICD-10-CM | POA: Diagnosis not present

## 2021-09-09 DIAGNOSIS — I10 Essential (primary) hypertension: Secondary | ICD-10-CM | POA: Diagnosis not present

## 2021-09-09 DIAGNOSIS — E782 Mixed hyperlipidemia: Secondary | ICD-10-CM

## 2021-09-09 DIAGNOSIS — E440 Moderate protein-calorie malnutrition: Secondary | ICD-10-CM

## 2021-09-09 DIAGNOSIS — L602 Onychogryphosis: Secondary | ICD-10-CM

## 2021-09-09 NOTE — Patient Instructions (Signed)
To do warm epsom salt soaks to bilateral feet and use lotion to moisturize.

## 2021-09-09 NOTE — Progress Notes (Unsigned)
Careteam: Patient Care Team: [name], NP as PCP - General (Nurse Practitioner)  PLACE OF SERVICE:  [place] Directive information Does Patient Have a Medical Advance Directive?: Yes, Type of Advance Directive: Out of facility DNR (pink MOST or yellow form), Pre-existing out of facility DNR order (yellow form or pink MOST form): Yellow form placed in chart (order not valid for inpatient use);Pink MOST form placed in chart (order not valid for inpatient use), Does patient want to make changes to medical advance directive?: No - Patient declined  Allergies  Allergen Reactions   Diltiazem Rash and Other (See Comments)    Blisters   Penicillins Other (See Comments)    Unknown Childhood allergy  Has patient had a PCN reaction causing immediate rash, facial/tongue/throat swelling, SOB or lightheadedness with hypotension: unknown Has patient had a PCN reaction causing severe rash involving mucus membranes or skin necrosis: unknown Has patient had a PCN reaction that required hospitalization unknown Has patient had a PCN reaction occurring within the last 10 years:no If all of the above answers are "NO", then may proceed with Cephalosporin use.     Chief Complaint  Patient presents with   Medical Management of Chronic Issues    3 month follow-up. Discuss need for td/tdap and shingrix or post pone if patient refuses. NCIR verified. Here with sister and brother      HPI: Patient is a 81 y.o. male for routine follow up.   Here today with sister and brother.  Reports things are going good at home.   Sometimes he eats well and others does not- he has lost some weight since the last visit.  Takes medications has prescribed.   Has not had any seizures.   Had to go to the ED due to weakness but improved after fluids.    Review of Systems:  ROS***  Past Medical History:  Diagnosis Date   Alcohol abuse    Anemia    Aneurysm (HCC)    CKD (chronic kidney  disease), stage III (HCC)    COPD (chronic obstructive pulmonary disease) (HCC)    Dysrhythmia    Fever of unknown origin 08/2016   History of atrial fibrillation    Hyperchloremia    Hypercholesteremia    Hyperpotassemia    Hypertension    Hypertensive renal disease, benign    Leg edema    PAD (peripheral artery disease) (HCC)    Pneumonia    Pressure ulcer of left heel    Seizures ([place])    has not had a seizure in 15 yrs   Tobacco use    Weight loss    Past Surgical History:  Procedure Laterality Date   ABDOMINAL AORTOGRAM W/LOWER EXTREMITY N/A 08/13/2016   Procedure: Abdominal Aortogram w/Lower Extremity;  Surgeon: Wellington Hampshire, MD;  Location: [facility];  Service: Cardiovascular;  Laterality: N/A;   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, 1996   EYE SURGERY     December 2017   PERIPHERAL VASCULAR BALLOON ANGIOPLASTY  08/13/2016   Procedure: Peripheral Vascular Balloon Angioplasty;  Surgeon: Wellington Hampshire, MD;  Location: [facility];  Service: Cardiovascular;;  Aborted   Social History:   reports that he has been smoking cigarettes. He has never used smokeless tobacco. He reports that he does not currently use alcohol. He reports that he does not use drugs.  Family History  Problem Relation Age of Onset   Cancer Mother  Congestive Heart Failure Father    Alzheimer's disease Sister    Congestive Heart Failure Sister    Clotting disorder Sister    Congestive Heart Failure Brother    Clotting disorder Sister     Medications: Patient's Medications  New Prescriptions   No medications on file  Previous Medications   ALBUTEROL (PROVENTIL HFA;VENTOLIN HFA) 108 (90 BASE) MCG/ACT INHALER    Inhale 2 puffs into the lungs every 6 (six) hours as needed for wheezing or shortness of breath.   ASPIRIN EC 81 MG TABLET    Take 1 tablet (81 mg total) by mouth daily.   ATORVASTATIN (LIPITOR) 20 MG TABLET    TAKE 1 TABLET BY MOUTH EVERY DAY   CARBOXYMETHYLCELLULOSE  SODIUM (EYE DROPS OP)    Apply 1 drop to eye daily as needed (dry eyes).   CARVEDILOL (COREG) 3.125 MG TABLET    TAKE 1 TABLET (3.125 MG TOTAL) BY MOUTH 2 (TWO) TIMES DAILY WITH A MEAL. I48.0.   CYANOCOBALAMIN (VITAMIN B 12 PO)    Take 1 tablet by mouth daily.   DIVALPROEX (DEPAKOTE) 250 MG DR TABLET    TAKE 1 TABLET BY MOUTH THREE TIMES A DAY AS NEEDED FOR SEIZURES   HYDRALAZINE (APRESOLINE) 50 MG TABLET    Take 1 tablet (50 mg total) by mouth 3 (three) times daily.   LEVETIRACETAM (KEPPRA) 500 MG TABLET    TAKE 1 TABLET BY MOUTH TWICE A DAY   MEMANTINE (NAMENDA) 10 MG TABLET    TAKE 1 TABLET BY MOUTH TWICE A DAY   MULTIPLE VITAMINS-MINERALS (THEREMS-M) TABS    TAKE 1 TABLET BY MOUTH EVERY DAY   SODIUM ZIRCONIUM CYCLOSILICATE (LOKELMA) 10 G PACK PACKET    Take 10 g by mouth daily.   THIAMINE (CVS B-1) 100 MG TABLET    Take 1 tablet (100 mg total) by mouth daily.  Modified Medications   No medications on file  Discontinued Medications   No medications on file    Physical Exam:  Vitals:   09/09/21 1436  BP: 134/82  Pulse: 82  Weight: 107 lb (48.5 kg)  Height: '5\' 4"'$  (1.626 m)   Body mass index is 18.37 kg/m. Wt Readings from Last 3 Encounters:  09/09/21 107 lb (48.5 kg)  07/11/21 112 lb 7 oz (51 kg)  05/31/21 113 lb (51.3 kg)    Physical Exam***  Labs reviewed: Basic Metabolic Panel: Recent Labs    01/18/21 1140 05/31/21 1611 07/11/21 1900  NA 138 138 140  K 5.2 5.0 5.1  CL 106 106 110  CO2 '27 26 25  '$ GLUCOSE 82 93 100*  BUN 28* 17 26*  CREATININE 1.05 1.57* 1.33*  CALCIUM 8.8 8.9 8.7*   Liver Function Tests: Recent Labs    01/18/21 1140 05/31/21 1611 07/11/21 1900  AST '20 22 26  '$ ALT '10 13 19  '$ ALKPHOS  --   --  65  BILITOT 0.2 0.2 0.7  PROT 6.2 7.0 7.3  ALBUMIN  --   --  3.7   Recent Labs    07/11/21 1900  LIPASE 61*   No results for input(s): AMMONIA in the last 8760 hours. CBC: Recent Labs    01/18/21 1140 05/31/21 1611 07/11/21 1900  WBC 5.6  5.1 6.7  NEUTROABS 2,839 1,841 3.0  HGB 10.6* 11.5* 11.0*  HCT 33.3* 34.8* 34.7*  MCV 93.5 90.2 92.8  PLT 166 162 188   Lipid Panel: Recent Labs    10/12/20 1121 05/31/21 1611  CHOL 164 148  HDL 42 46  LDLCALC 102* 83  TRIG 104 96  CHOLHDL 3.9 3.2   TSH: No results for input(s): TSH in the last 8760 hours. A1C: Lab Results  Component Value Date   HGBA1C 5.2 04/15/2016     Assessment/Plan There are no diagnoses linked to this encounter.  No follow-ups on file.: *** Terese Heier K. [place], [facility] [phone]

## 2021-09-15 ENCOUNTER — Other Ambulatory Visit: Payer: Self-pay | Admitting: Nurse Practitioner

## 2021-09-24 ENCOUNTER — Telehealth: Payer: Self-pay

## 2021-09-24 ENCOUNTER — Telehealth: Payer: Self-pay | Admitting: *Deleted

## 2021-09-24 ENCOUNTER — Ambulatory Visit (INDEPENDENT_AMBULATORY_CARE_PROVIDER_SITE_OTHER): Payer: Medicare PPO | Admitting: Nurse Practitioner

## 2021-09-24 VITALS — BP 120/76 | HR 58 | Temp 97.8°F | Resp 18

## 2021-09-24 DIAGNOSIS — Z0000 Encounter for general adult medical examination without abnormal findings: Secondary | ICD-10-CM

## 2021-09-24 NOTE — Progress Notes (Signed)
  This service is provided via telemedicine  No vital signs collected/recorded due to the encounter was a telemedicine visit.   Location of patient (ex: home, work):  home  Patient consents to a telephone visit:  Yes  Location of the provider (ex: office, home):  [facility]  Name of any referring provider:  [name], NP  Names of all persons participating in the telemedicine service and their role in the encounter:  Alisa Graff Clement Deneault,CMA and [name], NP  Time spent on call:  7:10 Minutes.

## 2021-09-24 NOTE — Telephone Encounter (Signed)
Thank you :)

## 2021-09-24 NOTE — Progress Notes (Signed)
Subjective:   [name] is a 81 y.o. male who presents for Medicare Annual/Subsequent preventive examination.  Review of Systems     Cardiac Risk Factors include: advanced age (>14mn, >>29women);sedentary lifestyle;male gender;dyslipidemia     Objective:    Today's Vitals   09/24/21 1511  BP: 120/76  Pulse: (!) 58  Resp: 18  Temp: 97.8 F (36.6 C)  SpO2: 99%   There is no height or weight on file to calculate BMI.     09/24/2021    2:01 PM 09/09/2021    2:36 PM 07/11/2021    6:50 PM 05/31/2021    3:34 PM 01/18/2021    8:48 AM 10/12/2020   10:44 AM 09/20/2020   11:21 AM  Advanced Directives  Does Patient Have a Medical Advance Directive? Yes Yes No;Yes Yes Yes Yes Yes  Type of Advance Directive Healthcare [place] (pink MOST or yellow form) Out of facility DNR (pink MOST or yellow form) Out of facility DNR (pink MOST or yellow form) Out of facility DNR (pink MOST or yellow form) Out of facility DNR (pink MOST or yellow form) Out of facility DNR (pink MOST or yellow form)  Does patient want to make changes to medical advance directive? No - Patient declined No - Patient declined Yes (ED - Information included in AVS) No - Patient declined No - Patient declined No - Patient declined No - Patient declined  Would patient like information on creating a medical advance directive?   No - Patient declined      Pre-existing out of facility DNR order (yellow form or pink MOST form)  Yellow form placed in chart (order not valid for inpatient use);Pink MOST form placed in chart (order not valid for inpatient use) Pink Most/Yellow Form available - Physician notified to receive inpatient order Yellow form placed in chart (order not valid for inpatient use);Pink MOST form placed in chart (order not valid for inpatient use) Yellow form placed in chart (order not valid for inpatient use);Pink MOST form placed in chart (order not valid for inpatient use) Yellow form placed  in chart (order not valid for inpatient use);Pink MOST form placed in chart (order not valid for inpatient use) Yellow form placed in chart (order not valid for inpatient use);Pink MOST form placed in chart (order not valid for inpatient use)    Current Medications (verified) Outpatient Encounter Medications as of 09/24/2021  Medication Sig   albuterol (PROVENTIL HFA;VENTOLIN HFA) 108 (90 Base) MCG/ACT inhaler Inhale 2 puffs into the lungs every 6 (six) hours as needed for wheezing or shortness of breath.   aspirin EC 81 MG tablet Take 1 tablet (81 mg total) by mouth daily.   atorvastatin (LIPITOR) 20 MG tablet TAKE 1 TABLET BY MOUTH EVERY DAY   Carboxymethylcellulose Sodium (EYE DROPS OP) Apply 1 drop to eye daily as needed (dry eyes).   carvedilol (COREG) 3.125 MG tablet TAKE 1 TABLET (3.125 MG TOTAL) BY MOUTH 2 (TWO) TIMES DAILY WITH A MEAL. I48.0.   Cyanocobalamin (VITAMIN B 12 PO) Take 1 tablet by mouth daily.   divalproex (DEPAKOTE) 250 MG DR tablet TAKE 1 TABLET BY MOUTH THREE TIMES A DAY AS NEEDED FOR SEIZURES   hydrALAZINE (APRESOLINE) 50 MG tablet TAKE 1 TABLET BY MOUTH THREE TIMES A DAY   levETIRAcetam (KEPPRA) 500 MG tablet TAKE 1 TABLET BY MOUTH TWICE A DAY   memantine (NAMENDA) 10 MG tablet TAKE 1 TABLET BY MOUTH TWICE  A DAY   Multiple Vitamins-Minerals (THEREMS-M) TABS TAKE 1 TABLET BY MOUTH EVERY DAY   sodium zirconium cyclosilicate (LOKELMA) 10 g PACK packet Take 10 g by mouth daily.   thiamine (CVS B-1) 100 MG tablet Take 1 tablet (100 mg total) by mouth daily.   No facility-administered encounter medications on file as of 09/24/2021.    Allergies (verified) Diltiazem and Penicillins   History: Past Medical History:  Diagnosis Date   Alcohol abuse    Anemia    Aneurysm (HCC)    CKD (chronic kidney disease), stage III (HCC)    COPD (chronic obstructive pulmonary disease) (HCC)    Dysrhythmia    Fever of unknown origin 08/2016   History of atrial fibrillation     Hyperchloremia    Hypercholesteremia    Hyperpotassemia    Hypertension    Hypertensive renal disease, benign    Leg edema    PAD (peripheral artery disease) (HCC)    Pneumonia    Pressure ulcer of left heel    Seizures ([place])    has not had a seizure in 15 yrs   Tobacco use    Weight loss    Past Surgical History:  Procedure Laterality Date   ABDOMINAL AORTOGRAM W/LOWER EXTREMITY N/A 08/13/2016   Procedure: Abdominal Aortogram w/Lower Extremity;  Surgeon: Wellington Hampshire, MD;  Location: [facility];  Service: Cardiovascular;  Laterality: N/A;   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, 1996   EYE SURGERY     December 2017   PERIPHERAL VASCULAR BALLOON ANGIOPLASTY  08/13/2016   Procedure: Peripheral Vascular Balloon Angioplasty;  Surgeon: Wellington Hampshire, MD;  Location: [facility];  Service: Cardiovascular;;  Aborted   Family History  Problem Relation Age of Onset   Cancer Mother    Congestive Heart Failure Father    Alzheimer's disease Sister    Congestive Heart Failure Sister    Clotting disorder Sister    Congestive Heart Failure Brother    Clotting disorder Sister    Social History   Socioeconomic History   Marital status: Single    Spouse name: Not on file   Number of children: Not on file   Years of education: Not on file   Highest education level: Not on file  Occupational History   Not on file  Tobacco Use   Smoking status: Some Days    Years: 56.00    Types: Cigarettes   Smokeless tobacco: Never   Tobacco comments:    2-3 daily, Smokes occasionally   Vaping Use   Vaping Use: Never used  Substance and Sexual Activity   Alcohol use: Not Currently    Alcohol/week: 0.0 standard drinks of alcohol   Drug use: No   Sexual activity: Not Currently  Other Topics Concern   Not on file  Social History Narrative   Not on file   Social Determinants of Health   Financial Resource Strain: Low Risk  (06/09/2017)   Overall Financial Resource Strain  (CARDIA)    Difficulty of Paying Living Expenses: Not hard at all  Food Insecurity: No Food Insecurity (06/09/2017)   Hunger Vital Sign    Worried About Running Out of Food in the Last Year: Never true    Ran Out of Food in the Last Year: Never true  Transportation Needs: No Transportation Needs (06/09/2017)   PRAPARE - Hydrologist (Medical): No    Lack of Transportation (Non-Medical): No  Physical  Activity: Inactive (06/09/2017)   Exercise Vital Sign    Days of Exercise per Week: 0 days    Minutes of Exercise per Session: 0 min  Stress: No Stress Concern Present (06/09/2017)   [place]    Feeling of Stress : Not at all  Social Connections: Moderately Isolated (06/09/2017)   Social Connection and Isolation Panel [NHANES]    Frequency of Communication with Friends and Family: More than three times a week    Frequency of Social Gatherings with Friends and Family: More than three times a week    Attends Religious Services: Never    Marine scientist or Organizations: No    Attends Music therapist: Never    Marital Status: Never married    Tobacco Counseling Ready to quit: Not Answered Counseling given: Not Answered Tobacco comments: 2-3 daily, Smokes occasionally    Clinical Intake:  Pre-visit preparation completed: Yes  Pain : No/denies pain     BMI - recorded: 18 Nutritional Status: BMI <19  Underweight Nutritional Risks: Unintentional weight loss Diabetes: No  How often do you need to have someone help you when you read instructions, pamphlets, or other written materials from your doctor or pharmacy?: 5 - [place] of Daily Living    09/24/2021    3:08 PM  In your present state of health, do you have any difficulty performing the following activities:  Hearing? 1  Vision? 0  Difficulty concentrating or making decisions? 1   Walking or climbing stairs? 0  Dressing or bathing? 1  Doing errands, shopping? 1  Preparing Food and eating ? Y  Using the Toilet? N  In the past six months, have you accidently leaked urine? Y  Do you have problems with loss of bowel control? Y  Managing your Medications? Y  Managing your Finances? Y  Housekeeping or managing your Housekeeping? Y    Patient Care Team: [name], NP as PCP - General (Nurse Practitioner)  Indicate any recent Medical Services you may have received from other than [facility] providers in the past year (date may be approximate).     Assessment:   This is a routine wellness examination for [name]  Hearing/Vision screen No results found.  Dietary issues and exercise activities discussed: Current Exercise Habits: The patient does not participate in regular exercise at present   Goals Addressed   None    Depression Screen    09/24/2021    1:56 PM 05/31/2021    3:38 PM 09/20/2020   11:17 AM 10/03/2019    2:53 PM 09/19/2019    1:07 PM 06/03/2019    2:55 PM 04/06/2019    3:21 PM  PHQ 2/9 Scores  PHQ - 2 Score 0 0 0 0 0 0 0    Fall Risk    09/24/2021    1:56 PM 05/31/2021    3:38 PM 01/18/2021   11:10 AM 09/20/2020   11:19 AM 02/20/2020   11:27 AM  [place] in the past year?  0 0 0 0  Number falls in past yr: 0 0 0 0 0  Injury with Fall? 0 0 0 0 0  Risk for fall due to :  No Fall Risks No Fall Risks    Follow up  Falls evaluation completed Falls evaluation completed      [place]  HOME:  Any stairs in or around the home? Yes  If so, are there any without handrails? Yes  Home free of loose throw rugs in walkways, pet beds, electrical cords, etc? Yes  Adequate lighting in your home to reduce risk of falls? Yes   ASSISTIVE DEVICES UTILIZED TO PREVENT FALLS:  Life alert? No  Use of a cane, walker or w/c? Yes  Grab bars in the bathroom? Yes  Shower chair or bench in shower? Yes  Elevated  toilet seat or a handicapped toilet? Yes   TIMED UP AND GO:  Was the test performed? No .    Cognitive Function:    09/25/2016    9:53 AM 04/12/2015   11:02 AM 03/07/2014    1:47 PM  MMSE - Mini Mental State Exam  Orientation to time '2 5 4  '$ Orientation to Place '3 5 5  '$ Registration '3 3 3  '$ Attention/ Calculation 0 4 3  Recall '1 1 3  '$ Language- name 2 objects '2 2 2  '$ Language- repeat '1 1 1  '$ Language- follow 3 step command '3 3 3  '$ Language- read & follow direction '1 1 1  '$ Write a sentence 0 1 1  Copy design 1 0 1  Total score '17 26 27        '$ 09/24/2021    1:57 PM 09/20/2020   11:21 AM 09/19/2019    1:10 PM 09/01/2018    2:44 PM  6CIT Screen  What Year? 4 points 0 points 0 points 0 points  What month? 3 points 0 points 0 points 0 points  What time? 3 points 0 points 0 points 0 points  Count back from 20 4 points 0 points 4 points 2 points  Months in reverse 4 points 4 points 2 points 4 points  Repeat phrase 8 points 10 points 10 points 10 points  Total Score 26 points 14 points 16 points 16 points    Immunizations Immunization History  Administered Date(s) Administered   Fluad Quad(high Dose 65+) 12/02/2018, 02/20/2020, 01/18/2021   Influenza, High Dose Seasonal PF 11/26/2016, 12/11/2017   Influenza,inj,Quad PF,6+ Mos 01/19/2014, 04/12/2015, 12/13/2015   PFIZER(Purple Top)SARS-COV-2 Vaccination 04/28/2019, 05/27/2019, 03/23/2020   Pfizer Covid-19 Vaccine Bivalent Booster 39yr & up 12/18/2020   Pneumococcal Conjugate-13 04/12/2015   Pneumococcal Polysaccharide-23 02/02/2013   Td 04/07/2008    TDAP status: Due, Education has been provided regarding the importance of this vaccine. Advised may receive this vaccine at local pharmacy or Health Dept. Aware to provide a copy of the vaccination record if obtained from local pharmacy or Health Dept. Verbalized acceptance and understanding.  Flu Vaccine status: Up to date  Pneumococcal vaccine status: Up to date  Covid-19 vaccine  status: Information provided on how to obtain vaccines.   Qualifies for Shingles Vaccine? Yes   Zostavax completed No   Shingrix Completed?: No.    Education has been provided regarding the importance of this vaccine. Patient has been advised to call insurance company to determine out of pocket expense if they have not yet received this vaccine. Advised may also receive vaccine at local pharmacy or Health Dept. Verbalized acceptance and understanding.  Screening Tests Health Maintenance  Topic Date Due   Zoster Vaccines- Shingrix (1 of 2) Never done   TETANUS/TDAP  04/07/2018   COVID-19 Vaccine (5 - Pfizer series) 04/19/2021   INFLUENZA VACCINE  11/05/2021   Pneumonia Vaccine 81 Years old  Completed   HPV VACCINES  Aged Out    Health  Maintenance  Health Maintenance Due  Topic Date Due   Zoster Vaccines- Shingrix (1 of 2) Never done   TETANUS/TDAP  04/07/2018   COVID-19 Vaccine (5 - Pfizer series) 04/19/2021    Colorectal cancer screening: No longer required.   Lung Cancer Screening: (Low Dose CT Chest recommended if Age 73-80 years, 30 pack-year currently smoking OR have quit w/in 15years.) does not qualify.   Lung Cancer Screening Referral: na  Additional Screening:  Hepatitis C Screening: does not qualify  Vision Screening: Recommended annual ophthalmology exams for early detection of glaucoma and other disorders of the eye. Is the patient up to date with their annual eye exam?  No  Who is the provider or what is the name of the office in which the patient attends annual eye exams? [facility] If pt is not established with a provider, would they like to be referred to a provider to establish care? No .   Dental Screening: Recommended annual dental exams for proper oral hygiene     Plan:     I have personally reviewed and noted the following in the patient's chart:   Medical and social history Use of alcohol, tobacco or illicit drugs  Current medications and  supplements including opioid prescriptions. Patient is not currently taking opioid prescriptions. Functional ability and status Nutritional status Physical activity Advanced directives List of other physicians Hospitalizations, surgeries, and ER visits in previous 12 months Vitals Screenings to include cognitive, depression, and falls Referrals and appointments  In addition, I have reviewed and discussed with patient certain preventive protocols, quality metrics, and best practice recommendations. A written personalized care plan for preventive services as well as general preventive health recommendations were provided to patient.     [name], NP   09/24/2021    Virtual Visit via Telephone Note  I connected with patient 09/24/21 at  3:20 PM EDT by telephone and verified that I am speaking with the correct person using two identifiers.  Location: Patient: home [place]   I discussed the limitations, risks, security and privacy concerns of performing an evaluation and management service by telephone and the availability of in person appointments. I also discussed with the patient that there may be a patient responsible charge related to this service. The patient expressed understanding and agreed to proceed.   I discussed the assessment and treatment plan with the patient. The patient was provided an opportunity to ask questions and all were answered. The patient agreed with the plan and demonstrated an understanding of the instructions.   The patient was advised to call back or seek an in-person evaluation if the symptoms worsen or if the condition fails to improve as anticipated.  I provided 16 minutes of non-face-to-face time during this encounter.  Carlos American. Harle Battiest Avs printed and mailed

## 2021-09-24 NOTE — Patient Instructions (Signed)
[name] , Thank you for taking time to come for your Medicare Wellness Visit. I appreciate your ongoing commitment to your health goals. Please review the following plan we discussed and let me know if I can assist you in the future.   Screening recommendations/referrals: Colonoscopy aged out Recommended yearly ophthalmology/optometry visit for glaucoma screening and checkup Recommended yearly dental visit for hygiene and checkup  Vaccinations: Influenza vaccine up to date Pneumococcal vaccine up to date Tdap vaccine DUE- recommend to get at your local pharmacy    Shingles vaccine DUE- recommend to get at your local pharmacy       Advanced directives: on file.   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 Needing increase supervision and assistance with ADLs  Next appointment: yearly for awv  Preventive Care 52 Years and Older, Male Preventive care refers to lifestyle choices and visits with your health care provider that can promote health and wellness. What does preventive care include? A yearly physical exam. This is also called an annual well check. Dental exams once or twice a year. Routine eye exams. Ask your health care provider how often you should have your eyes checked. Personal lifestyle choices, including: Daily care of your teeth and gums. Regular physical activity. Eating a healthy diet. Avoiding tobacco and drug use. Limiting alcohol use. Practicing safe sex. Taking low doses of aspirin every day. Taking vitamin and mineral supplements as recommended by your health care provider. What happens during an annual well check? The services and screenings done by your health care provider during your annual well check will depend on your age, overall health, lifestyle risk factors, and family history of disease. Counseling  Your health care provider may ask you questions about your: Alcohol use. Tobacco use. Drug use. Emotional well-being. Home  and relationship well-being. Sexual activity. Eating habits. History of falls. Memory and ability to understand (cognition). Work and work Statistician. Screening  You may have the following tests or measurements: Height, weight, and BMI. Blood pressure. Lipid and cholesterol levels. These may be checked every 5 years, or more frequently if you are over 63 years old. Skin check. Lung cancer screening. You may have this screening every year starting at age 47 if you have a 30-pack-year history of smoking and currently smoke or have quit within the past 15 years. Fecal occult blood test (FOBT) of the stool. You may have this test every year starting at age 74. Flexible sigmoidoscopy or colonoscopy. You may have a sigmoidoscopy every 5 years or a colonoscopy every 10 years starting at age 23. Prostate cancer screening. Recommendations will vary depending on your family history and other risks. Hepatitis C blood test. Hepatitis B blood test. Sexually transmitted disease (STD) testing. Diabetes screening. This is done by checking your blood sugar (glucose) after you have not eaten for a while (fasting). You may have this done every 1-3 years. Abdominal aortic aneurysm (AAA) screening. You may need this if you are a current or former smoker. Osteoporosis. You may be screened starting at age 40 if you are at high risk. Talk with your health care provider about your test results, treatment options, and if necessary, the need for more tests. Vaccines  Your health care provider may recommend certain vaccines, such as: Influenza vaccine. This is recommended every year. Tetanus, diphtheria, and acellular pertussis (Tdap, Td) vaccine. You may need a Td booster every 10 years. Zoster vaccine. You may need this after age 76. Pneumococcal 13-valent conjugate (PCV13) vaccine. One dose  is recommended after age 64. Pneumococcal polysaccharide (PPSV23) vaccine. One dose is recommended after age 62. Talk to  your health care provider about which screenings and vaccines you need and how often you need them. This information is not intended to replace advice given to you by your health care provider. Make sure you discuss any questions you have with your health care provider. Document Released: 04/20/2015 Document Revised: 12/12/2015 Document Reviewed: 01/23/2015 Elsevier Interactive Patient Education  2017 [place] Prevention in the Home Falls can cause injuries. They can happen to people of all ages. There are many things you can do to make your home safe and to help prevent falls. What can I do on the outside of my home? Regularly fix the edges of walkways and driveways and fix any cracks. Remove anything that might make you trip as you walk through a door, such as a raised step or threshold. Trim any bushes or trees on the path to your home. Use bright outdoor lighting. Clear any walking paths of anything that might make someone trip, such as rocks or tools. Regularly check to see if handrails are loose or broken. Make sure that both sides of any steps have handrails. Any raised decks and porches should have guardrails on the edges. Have any leaves, snow, or ice cleared regularly. Use sand or salt on walking paths during winter. Clean up any spills in your garage right away. This includes oil or grease spills. What can I do in the bathroom? Use night lights. Install grab bars by the toilet and in the tub and shower. Do not use towel bars as grab bars. Use non-skid mats or decals in the tub or shower. If you need to sit down in the shower, use a plastic, non-slip stool. Keep the floor dry. Clean up any water that spills on the floor as soon as it happens. Remove soap buildup in the tub or shower regularly. Attach bath mats securely with double-sided non-slip rug tape. Do not have throw rugs and other things on the floor that can make you trip. What can I do in the bedroom? Use  night lights. Make sure that you have a light by your bed that is easy to reach. Do not use any sheets or blankets that are too big for your bed. They should not hang down onto the floor. Have a firm chair that has side arms. You can use this for support while you get dressed. Do not have throw rugs and other things on the floor that can make you trip. What can I do in the kitchen? Clean up any spills right away. Avoid walking on wet floors. Keep items that you use a lot in easy-to-reach places. If you need to reach something above you, use a strong step stool that has a grab bar. Keep electrical cords out of the way. Do not use floor polish or wax that makes floors slippery. If you must use wax, use non-skid floor wax. Do not have throw rugs and other things on the floor that can make you trip. What can I do with my stairs? Do not leave any items on the stairs. Make sure that there are handrails on both sides of the stairs and use them. Fix handrails that are broken or loose. Make sure that handrails are as long as the stairways. Check any carpeting to make sure that it is firmly attached to the stairs. Fix any carpet that is loose or worn. Avoid  having throw rugs at the top or bottom of the stairs. If you do have throw rugs, attach them to the floor with carpet tape. Make sure that you have a light switch at the top of the stairs and the bottom of the stairs. If you do not have them, ask someone to add them for you. What else can I do to help prevent falls? Wear shoes that: Do not have high heels. Have rubber bottoms. Are comfortable and fit you well. Are closed at the toe. Do not wear sandals. If you use a stepladder: Make sure that it is fully opened. Do not climb a closed stepladder. Make sure that both sides of the stepladder are locked into place. Ask someone to hold it for you, if possible. Clearly mark and make sure that you can see: Any grab bars or handrails. First and last  steps. Where the edge of each step is. Use tools that help you move around (mobility aids) if they are needed. These include: Canes. Walkers. Scooters. Crutches. Turn on the lights when you go into a dark area. Replace any light bulbs as soon as they burn out. Set up your furniture so you have a clear path. Avoid moving your furniture around. If any of your floors are uneven, fix them. If there are any pets around you, be aware of where they are. Review your medicines with your doctor. Some medicines can make you feel dizzy. This can increase your chance of falling. Ask your doctor what other things that you can do to help prevent falls. This information is not intended to replace advice given to you by your health care provider. Make sure you discuss any questions you have with your health care provider. Document Released: 01/18/2009 Document Revised: 08/30/2015 Document Reviewed: 04/28/2014 Elsevier Interactive Patient Education  2017 [employer].

## 2021-09-24 NOTE — Telephone Encounter (Signed)
Renee Ramus w/ [employer] called to speak with Dani Gobble about patient. She states that she have a home visit with him today,09/24/21 @ 1:30 pm and would like to speak with [name] concerning the patient. I advised her a message would be send to [employer].

## 2021-09-24 NOTE — Telephone Encounter (Signed)
Mr. [name], [name] are scheduled for a virtual visit with your provider today.    Just as we do with appointments in the office, we must obtain your consent to participate.  Your consent will be active for this visit and any virtual visit you [name] have with one of our providers in the next 365 days.    If you have a MyChart account, I can also send a copy of this consent to you electronically.  All virtual visits are billed to your insurance company just like a traditional visit in the office.  As this is a virtual visit, video technology does not allow for your provider to perform a traditional examination.  This [name] limit your provider's ability to fully assess your condition.  If your provider identifies any concerns that need to be evaluated in person or the need to arrange testing such as labs, EKG, etc, we will make arrangements to do so.    Although advances in technology are sophisticated, we cannot ensure that it will always work on either your end or our end.  If the connection with a video visit is poor, we [name] have to switch to a telephone visit.  With either a video or telephone visit, we are not always able to ensure that we have a secure connection.   I need to obtain your verbal consent now.   Are you willing to proceed with your visit today?   [name] has provided verbal consent on 09/24/2021 for a virtual visit (video or telephone).   [name], [place] 09/24/2021  2:06 PM

## 2021-09-27 ENCOUNTER — Ambulatory Visit: Payer: Medicare PPO | Admitting: Nurse Practitioner

## 2021-10-28 DIAGNOSIS — Z961 Presence of intraocular lens: Secondary | ICD-10-CM | POA: Diagnosis not present

## 2021-10-28 DIAGNOSIS — H40023 Open angle with borderline findings, high risk, bilateral: Secondary | ICD-10-CM | POA: Diagnosis not present

## 2021-10-28 DIAGNOSIS — H353131 Nonexudative age-related macular degeneration, bilateral, early dry stage: Secondary | ICD-10-CM | POA: Diagnosis not present

## 2021-10-28 DIAGNOSIS — H2511 Age-related nuclear cataract, right eye: Secondary | ICD-10-CM | POA: Diagnosis not present

## 2021-12-17 ENCOUNTER — Other Ambulatory Visit: Payer: Self-pay | Admitting: Nurse Practitioner

## 2022-01-06 ENCOUNTER — Ambulatory Visit: Payer: Medicare PPO | Admitting: Nurse Practitioner

## 2022-01-09 ENCOUNTER — Ambulatory Visit: Payer: Medicare PPO | Admitting: Nurse Practitioner

## 2022-01-24 ENCOUNTER — Encounter: Payer: Medicare PPO | Admitting: Nurse Practitioner

## 2022-01-25 NOTE — Progress Notes (Signed)
This encounter was created in error - please disregard.

## 2022-01-27 ENCOUNTER — Ambulatory Visit: Payer: Medicare PPO | Admitting: Nurse Practitioner

## 2022-03-07 DEATH — deceased

## 2022-03-11 ENCOUNTER — Other Ambulatory Visit: Payer: Self-pay | Admitting: Nurse Practitioner
